# Patient Record
Sex: Male | Born: 1940 | Race: White | Hispanic: No | Marital: Single | Smoking: Current every day smoker
Health system: Southern US, Community
[De-identification: ages and names within clinical notes are randomized; demographics above are authoritative.]

## PROBLEM LIST (undated history)

## (undated) DIAGNOSIS — F329 Major depressive disorder, single episode, unspecified: Secondary | ICD-10-CM

## (undated) DIAGNOSIS — J439 Emphysema, unspecified: Secondary | ICD-10-CM

## (undated) DIAGNOSIS — J189 Pneumonia, unspecified organism: Secondary | ICD-10-CM

## (undated) DIAGNOSIS — F10239 Alcohol dependence with withdrawal, unspecified: Secondary | ICD-10-CM

## (undated) DIAGNOSIS — I10 Essential (primary) hypertension: Secondary | ICD-10-CM

## (undated) DIAGNOSIS — Z9889 Other specified postprocedural states: Secondary | ICD-10-CM

## (undated) DIAGNOSIS — G8929 Other chronic pain: Secondary | ICD-10-CM

## (undated) DIAGNOSIS — F10939 Alcohol use, unspecified with withdrawal, unspecified: Secondary | ICD-10-CM

## (undated) DIAGNOSIS — F32A Depression, unspecified: Secondary | ICD-10-CM

## (undated) DIAGNOSIS — C4490 Unspecified malignant neoplasm of skin, unspecified: Secondary | ICD-10-CM

## (undated) DIAGNOSIS — K2270 Barrett's esophagus without dysplasia: Secondary | ICD-10-CM

## (undated) DIAGNOSIS — R351 Nocturia: Secondary | ICD-10-CM

## (undated) DIAGNOSIS — R55 Syncope and collapse: Secondary | ICD-10-CM

## (undated) DIAGNOSIS — R3915 Urgency of urination: Secondary | ICD-10-CM

## (undated) DIAGNOSIS — I5181 Takotsubo syndrome: Secondary | ICD-10-CM

## (undated) DIAGNOSIS — R112 Nausea with vomiting, unspecified: Secondary | ICD-10-CM

## (undated) DIAGNOSIS — Z860100 Personal history of colon polyps, unspecified: Secondary | ICD-10-CM

## (undated) DIAGNOSIS — E785 Hyperlipidemia, unspecified: Secondary | ICD-10-CM

## (undated) DIAGNOSIS — M1990 Unspecified osteoarthritis, unspecified site: Secondary | ICD-10-CM

## (undated) DIAGNOSIS — H3530 Unspecified macular degeneration: Secondary | ICD-10-CM

## (undated) DIAGNOSIS — T7840XA Allergy, unspecified, initial encounter: Secondary | ICD-10-CM

## (undated) DIAGNOSIS — N400 Enlarged prostate without lower urinary tract symptoms: Secondary | ICD-10-CM

## (undated) DIAGNOSIS — I639 Cerebral infarction, unspecified: Secondary | ICD-10-CM

## (undated) DIAGNOSIS — I2510 Atherosclerotic heart disease of native coronary artery without angina pectoris: Secondary | ICD-10-CM

## (undated) DIAGNOSIS — Z87442 Personal history of urinary calculi: Secondary | ICD-10-CM

## (undated) DIAGNOSIS — W19XXXA Unspecified fall, initial encounter: Secondary | ICD-10-CM

## (undated) DIAGNOSIS — M549 Dorsalgia, unspecified: Secondary | ICD-10-CM

## (undated) DIAGNOSIS — K219 Gastro-esophageal reflux disease without esophagitis: Secondary | ICD-10-CM

## (undated) DIAGNOSIS — G629 Polyneuropathy, unspecified: Secondary | ICD-10-CM

## (undated) DIAGNOSIS — J449 Chronic obstructive pulmonary disease, unspecified: Secondary | ICD-10-CM

## (undated) DIAGNOSIS — K2970 Gastritis, unspecified, without bleeding: Secondary | ICD-10-CM

## (undated) DIAGNOSIS — K259 Gastric ulcer, unspecified as acute or chronic, without hemorrhage or perforation: Secondary | ICD-10-CM

## (undated) DIAGNOSIS — E871 Hypo-osmolality and hyponatremia: Secondary | ICD-10-CM

## (undated) HISTORY — DX: Allergy, unspecified, initial encounter: T78.40XA

## (undated) HISTORY — PX: OTHER SURGICAL HISTORY: SHX169

## (undated) HISTORY — PX: DENTAL SURGERY: SHX609

## (undated) HISTORY — PX: TONSILLECTOMY: SUR1361

## (undated) HISTORY — PX: COLONOSCOPY: SHX174

## (undated) HISTORY — DX: Atherosclerotic heart disease of native coronary artery without angina pectoris: I25.10

## (undated) HISTORY — PX: UPPER GASTROINTESTINAL ENDOSCOPY: SHX188

## (undated) HISTORY — PX: ROTATOR CUFF REPAIR: SHX139

## (undated) HISTORY — PX: CARDIAC CATHETERIZATION: SHX172

## (undated) HISTORY — DX: Gastro-esophageal reflux disease without esophagitis: K21.9

## (undated) HISTORY — PX: APPENDECTOMY: SHX54

## (undated) HISTORY — DX: Hyperlipidemia, unspecified: E78.5

## (undated) HISTORY — DX: Unspecified osteoarthritis, unspecified site: M19.90

## (undated) HISTORY — PX: POLYPECTOMY: SHX149

---

## 1993-09-02 DIAGNOSIS — I639 Cerebral infarction, unspecified: Secondary | ICD-10-CM

## 1993-09-02 HISTORY — DX: Cerebral infarction, unspecified: I63.9

## 1998-05-26 ENCOUNTER — Ambulatory Visit (HOSPITAL_COMMUNITY): Admission: RE | Admit: 1998-05-26 | Discharge: 1998-05-26 | Payer: Self-pay | Admitting: *Deleted

## 2000-04-17 ENCOUNTER — Ambulatory Visit (HOSPITAL_COMMUNITY): Admission: RE | Admit: 2000-04-17 | Discharge: 2000-04-17 | Payer: Self-pay | Admitting: *Deleted

## 2000-04-22 ENCOUNTER — Ambulatory Visit (HOSPITAL_COMMUNITY): Admission: RE | Admit: 2000-04-22 | Discharge: 2000-04-22 | Payer: Self-pay | Admitting: *Deleted

## 2001-02-19 ENCOUNTER — Ambulatory Visit (HOSPITAL_COMMUNITY): Admission: RE | Admit: 2001-02-19 | Discharge: 2001-02-19 | Payer: Self-pay | Admitting: Internal Medicine

## 2001-05-06 ENCOUNTER — Encounter (INDEPENDENT_AMBULATORY_CARE_PROVIDER_SITE_OTHER): Payer: Self-pay | Admitting: *Deleted

## 2001-05-06 ENCOUNTER — Encounter: Payer: Self-pay | Admitting: Gastroenterology

## 2001-05-06 ENCOUNTER — Ambulatory Visit (HOSPITAL_COMMUNITY): Admission: RE | Admit: 2001-05-06 | Discharge: 2001-05-06 | Payer: Self-pay | Admitting: *Deleted

## 2001-05-06 ENCOUNTER — Encounter (INDEPENDENT_AMBULATORY_CARE_PROVIDER_SITE_OTHER): Payer: Self-pay | Admitting: Specialist

## 2001-12-10 ENCOUNTER — Encounter: Payer: Self-pay | Admitting: Orthopedic Surgery

## 2001-12-15 ENCOUNTER — Observation Stay (HOSPITAL_COMMUNITY): Admission: RE | Admit: 2001-12-15 | Discharge: 2001-12-16 | Payer: Self-pay | Admitting: Orthopedic Surgery

## 2002-02-16 ENCOUNTER — Observation Stay (HOSPITAL_COMMUNITY): Admission: RE | Admit: 2002-02-16 | Discharge: 2002-02-17 | Payer: Self-pay | Admitting: Orthopedic Surgery

## 2002-02-19 ENCOUNTER — Encounter: Payer: Self-pay | Admitting: Urology

## 2002-02-19 ENCOUNTER — Ambulatory Visit (HOSPITAL_COMMUNITY): Admission: RE | Admit: 2002-02-19 | Discharge: 2002-02-19 | Payer: Self-pay | Admitting: Urology

## 2003-01-24 ENCOUNTER — Emergency Department (HOSPITAL_COMMUNITY): Admission: EM | Admit: 2003-01-24 | Discharge: 2003-01-24 | Payer: Self-pay | Admitting: Emergency Medicine

## 2005-04-16 ENCOUNTER — Encounter: Payer: Self-pay | Admitting: Gastroenterology

## 2005-04-16 ENCOUNTER — Encounter (INDEPENDENT_AMBULATORY_CARE_PROVIDER_SITE_OTHER): Payer: Self-pay | Admitting: *Deleted

## 2005-04-16 ENCOUNTER — Ambulatory Visit (HOSPITAL_COMMUNITY): Admission: RE | Admit: 2005-04-16 | Discharge: 2005-04-16 | Payer: Self-pay | Admitting: *Deleted

## 2007-07-10 ENCOUNTER — Encounter: Admission: RE | Admit: 2007-07-10 | Discharge: 2007-07-10 | Payer: Self-pay | Admitting: Neurosurgery

## 2008-04-28 ENCOUNTER — Ambulatory Visit: Payer: Self-pay | Admitting: Gastroenterology

## 2008-04-28 DIAGNOSIS — K219 Gastro-esophageal reflux disease without esophagitis: Secondary | ICD-10-CM | POA: Insufficient documentation

## 2008-04-28 DIAGNOSIS — R1314 Dysphagia, pharyngoesophageal phase: Secondary | ICD-10-CM | POA: Insufficient documentation

## 2008-04-28 DIAGNOSIS — M129 Arthropathy, unspecified: Secondary | ICD-10-CM | POA: Insufficient documentation

## 2008-04-28 DIAGNOSIS — E785 Hyperlipidemia, unspecified: Secondary | ICD-10-CM

## 2008-04-28 DIAGNOSIS — I25119 Atherosclerotic heart disease of native coronary artery with unspecified angina pectoris: Secondary | ICD-10-CM

## 2008-04-28 DIAGNOSIS — I6350 Cerebral infarction due to unspecified occlusion or stenosis of unspecified cerebral artery: Secondary | ICD-10-CM | POA: Insufficient documentation

## 2008-04-28 DIAGNOSIS — Z87442 Personal history of urinary calculi: Secondary | ICD-10-CM

## 2008-05-03 ENCOUNTER — Telehealth: Payer: Self-pay | Admitting: Gastroenterology

## 2008-05-20 ENCOUNTER — Ambulatory Visit: Payer: Self-pay | Admitting: Gastroenterology

## 2008-05-20 ENCOUNTER — Encounter: Payer: Self-pay | Admitting: Gastroenterology

## 2008-05-24 ENCOUNTER — Encounter: Payer: Self-pay | Admitting: Gastroenterology

## 2008-05-25 ENCOUNTER — Telehealth: Payer: Self-pay | Admitting: Gastroenterology

## 2009-05-18 ENCOUNTER — Telehealth: Payer: Self-pay | Admitting: Gastroenterology

## 2009-05-18 DIAGNOSIS — K2270 Barrett's esophagus without dysplasia: Secondary | ICD-10-CM

## 2009-06-13 ENCOUNTER — Ambulatory Visit: Payer: Self-pay | Admitting: Gastroenterology

## 2009-06-20 ENCOUNTER — Encounter: Payer: Self-pay | Admitting: Gastroenterology

## 2009-06-20 ENCOUNTER — Ambulatory Visit: Payer: Self-pay | Admitting: Gastroenterology

## 2009-06-22 ENCOUNTER — Encounter: Payer: Self-pay | Admitting: Gastroenterology

## 2009-11-03 ENCOUNTER — Encounter: Admission: RE | Admit: 2009-11-03 | Discharge: 2009-11-03 | Payer: Self-pay | Admitting: Cardiology

## 2009-11-06 ENCOUNTER — Ambulatory Visit (HOSPITAL_COMMUNITY): Admission: RE | Admit: 2009-11-06 | Discharge: 2009-11-07 | Payer: Self-pay | Admitting: Cardiology

## 2009-12-07 ENCOUNTER — Encounter (HOSPITAL_COMMUNITY): Admission: RE | Admit: 2009-12-07 | Discharge: 2010-03-01 | Payer: Self-pay | Admitting: Cardiology

## 2010-02-16 ENCOUNTER — Encounter: Payer: Self-pay | Admitting: Pulmonary Disease

## 2010-02-16 ENCOUNTER — Ambulatory Visit (HOSPITAL_COMMUNITY): Admission: RE | Admit: 2010-02-16 | Discharge: 2010-02-16 | Payer: Self-pay | Admitting: Internal Medicine

## 2010-02-26 ENCOUNTER — Ambulatory Visit: Payer: Self-pay | Admitting: Pulmonary Disease

## 2010-02-26 DIAGNOSIS — J439 Emphysema, unspecified: Secondary | ICD-10-CM

## 2010-02-26 DIAGNOSIS — R0789 Other chest pain: Secondary | ICD-10-CM

## 2010-02-27 ENCOUNTER — Ambulatory Visit: Payer: Self-pay | Admitting: Internal Medicine

## 2010-03-09 ENCOUNTER — Telehealth: Payer: Self-pay | Admitting: Pulmonary Disease

## 2010-04-04 ENCOUNTER — Encounter: Payer: Self-pay | Admitting: Gastroenterology

## 2010-04-09 ENCOUNTER — Ambulatory Visit: Payer: Self-pay | Admitting: Pulmonary Disease

## 2010-06-02 ENCOUNTER — Encounter (HOSPITAL_COMMUNITY)
Admission: RE | Admit: 2010-06-02 | Discharge: 2010-08-31 | Payer: Self-pay | Source: Home / Self Care | Attending: Pulmonary Disease | Admitting: Pulmonary Disease

## 2010-06-04 ENCOUNTER — Ambulatory Visit: Payer: Self-pay | Admitting: Gastroenterology

## 2010-06-04 DIAGNOSIS — Z860100 Personal history of colon polyps, unspecified: Secondary | ICD-10-CM | POA: Insufficient documentation

## 2010-06-05 ENCOUNTER — Telehealth: Payer: Self-pay | Admitting: Gastroenterology

## 2010-06-05 ENCOUNTER — Encounter: Payer: Self-pay | Admitting: Pulmonary Disease

## 2010-07-09 ENCOUNTER — Ambulatory Visit: Payer: Self-pay | Admitting: Pulmonary Disease

## 2010-07-10 ENCOUNTER — Ambulatory Visit: Payer: Self-pay | Admitting: Gastroenterology

## 2010-07-22 ENCOUNTER — Encounter: Payer: Self-pay | Admitting: Gastroenterology

## 2010-09-04 ENCOUNTER — Encounter (HOSPITAL_COMMUNITY)
Admission: RE | Admit: 2010-09-04 | Discharge: 2010-10-02 | Payer: Self-pay | Source: Home / Self Care | Attending: Pulmonary Disease | Admitting: Pulmonary Disease

## 2010-10-04 ENCOUNTER — Encounter (HOSPITAL_COMMUNITY): Payer: BC Managed Care – PPO | Attending: Pulmonary Disease

## 2010-10-04 DIAGNOSIS — K219 Gastro-esophageal reflux disease without esophagitis: Secondary | ICD-10-CM | POA: Insufficient documentation

## 2010-10-04 DIAGNOSIS — F17200 Nicotine dependence, unspecified, uncomplicated: Secondary | ICD-10-CM | POA: Insufficient documentation

## 2010-10-04 DIAGNOSIS — E785 Hyperlipidemia, unspecified: Secondary | ICD-10-CM | POA: Insufficient documentation

## 2010-10-04 DIAGNOSIS — I2510 Atherosclerotic heart disease of native coronary artery without angina pectoris: Secondary | ICD-10-CM | POA: Insufficient documentation

## 2010-10-04 DIAGNOSIS — I10 Essential (primary) hypertension: Secondary | ICD-10-CM | POA: Insufficient documentation

## 2010-10-04 DIAGNOSIS — Z5189 Encounter for other specified aftercare: Secondary | ICD-10-CM | POA: Insufficient documentation

## 2010-10-04 DIAGNOSIS — Z8673 Personal history of transient ischemic attack (TIA), and cerebral infarction without residual deficits: Secondary | ICD-10-CM | POA: Insufficient documentation

## 2010-10-04 DIAGNOSIS — J4489 Other specified chronic obstructive pulmonary disease: Secondary | ICD-10-CM | POA: Insufficient documentation

## 2010-10-04 DIAGNOSIS — Z9861 Coronary angioplasty status: Secondary | ICD-10-CM | POA: Insufficient documentation

## 2010-10-04 DIAGNOSIS — Z7982 Long term (current) use of aspirin: Secondary | ICD-10-CM | POA: Insufficient documentation

## 2010-10-04 DIAGNOSIS — J449 Chronic obstructive pulmonary disease, unspecified: Secondary | ICD-10-CM | POA: Insufficient documentation

## 2010-10-04 DIAGNOSIS — Z7902 Long term (current) use of antithrombotics/antiplatelets: Secondary | ICD-10-CM | POA: Insufficient documentation

## 2010-10-04 NOTE — Letter (Signed)
Summary: [facility] Instructions  [facility]  [address] [place], [place] [address]   Phone: [phone]  Fax: [phone]       Brad Turner    1940-09-28    MRN: [id number]        Procedure Nazar /Date:MONDAY 06/18/2010     Arrival Time:2PM     Procedure Time:3PM     Location of Procedure:                    X  [facility] (4th Floor)   PREPARATION FOR COLONOSCOPY WITH MOVIPREP   Starting 5 days prior to your procedure 10/12/2011do not eat nuts, seeds, popcorn, corn, beans, peas,  salads, or any raw vegetables.  Do not take any fiber supplements (e.g. Metamucil, Citrucel, and Benefiber).  THE Constantin BEFORE YOUR PROCEDURE         DATE: 06/17/2010  FIE:PPIRJJ  1.  Drink clear liquids the entire Narain-NO SOLID FOOD  2.  Do not drink anything colored red or purple.  Avoid juices with pulp.  No orange juice.  3.  Drink at least 64 oz. (8 glasses) of fluid/clear liquids during the [place] to prevent dehydration and help the prep work efficiently.  CLEAR LIQUIDS INCLUDE: Water Jello Ice Popsicles Tea (sugar ok, no milk/cream) Powdered fruit flavored drinks Coffee (sugar ok, no milk/cream) Gatorade Juice: apple, white grape, white cranberry  Lemonade Clear bullion, consomm, broth Carbonated beverages (any kind) Strained chicken noodle soup Hard Candy                             4.  In the morning, mix first dose of MoviPrep solution:    Empty 1 Pouch A and 1 Pouch B into the disposable container    Add lukewarm drinking water to the top line of the container. Mix to dissolve    Refrigerate (mixed solution should be used within 24 hrs)  5.  Begin drinking the prep at 5:00 p.m. The MoviPrep container is divided by 4 marks.   Every 15 minutes drink the solution down to the next mark (approximately 8 oz) until the full liter is complete.   6.  Follow completed prep with 16 oz of clear liquid of your choice (Nothing red or purple).  Continue to  drink clear liquids until bedtime.  7.  Before going to bed, mix second dose of MoviPrep solution:    Empty 1 Pouch A and 1 Pouch B into the disposable container    Add lukewarm drinking water to the top line of the container. Mix to dissolve    Refrigerate  THE [place] OF YOUR PROCEDURE      DATE: 06/18/2010 Dery: MONDAY  Beginning at 10a.m. (5 hours before procedure):         1. Every 15 minutes, drink the solution down to the next mark (approx 8 oz) until the full liter is complete.  2. Follow completed prep with 16 oz. of clear liquid of your choice.    3. You may drink clear liquids until 1PM (2 HOURS BEFORE PROCEDURE).   MEDICATION INSTRUCTIONS  Unless otherwise instructed, you should take regular prescription medications with a small sip of water   as early as possible the morning of your procedure.    Stop taking Plavix or Aggrenox on  _  _  (7 days before procedure).   You will be contacted by our office prior to  your procedure for directions on holding your Plavix.  If you do not hear from our office 1 week prior to your scheduled procedure, please call [phone] to discuss.           OTHER INSTRUCTIONS  You will need a responsible adult at least 68 years of age to accompany you and drive you home.   This person must remain in the waiting room during your procedure.  Wear loose fitting clothing that is easily removed.  Leave jewelry and other valuables at home.  However, you may wish to bring a book to read or  an iPod/MP3 player to listen to music as you wait for your procedure to start.  Remove all body piercing jewelry and leave at home.  Total time from sign-in until discharge is approximately 2-3 hours.  You should go home directly after your procedure and rest.  You can resume normal activities the  [place] after your procedure.  The Bureau of your procedure you should not:   Drive   Make legal decisions   Operate machinery   Drink alcohol    Return to work  You will receive specific instructions about eating, activities and medications before you leave.    The above instructions have been reviewed and explained to me by   _______________________    I fully understand and can verbalize these instructions _____________________________ Date _________  Appended Document: Moviprep Instructions CHANGED PROCEDURE TO 07/10/2010 AT 4PM PT AWARE

## 2010-10-04 NOTE — Assessment & Plan Note (Signed)
Summary: rov for emphysema   Visit Type:  Follow-up Copy to:  Guerry Bruin Primary Provider/Referring Provider:  Guerry Bruin, M.D.  CC:  3 month follow up. pt states breathing is still the same. Pt c/o cough with clear mucus. Pt states he currently has a "cold". Pt states the spiriva is drying his mouth out and he has not used it in 3 days.  pt states he smokes 5 cigs a [place]. Marland Kitchen  History of Present Illness: the pt comes in today for f/u of his known emphysema.  He feels that he is maintaining a stable baseline wrt exertional tolerance, and his only complaint about his meds is a dry mouth from spiriva.  He unfortunately is still smoking, though he tells me a small amount.  He has chronic cough with white mucus.  Preventive Screening-Counseling & Management  Alcohol-Tobacco     Smoking Status: current     Smoking Cessation Counseling: yes     Packs/[place]: <0.25     Tobacco Counseling: to quit use of tobacco products  Current Medications (verified): 1)  Plavix 75 Mg Tabs (Clopidogrel Bisulfate) .Marland Kitchen.. 1 Tablet By Mouth Once Daily 2)  Lipitor 80 Mg Tabs (Atorvastatin Calcium) .Marland Kitchen.. 1 Tablet By Mouth Once Daily 3)  Tricor 145 Mg Tabs (Fenofibrate) .Marland Kitchen.. 1 Tablet By Mouth Once Daily 4)  Amlodipine Besylate 10 Mg Tabs (Amlodipine Besylate) .Marland Kitchen.. 1 Tablet By Mouth Once Daily 5)  Ramipril 10 Mg Caps (Ramipril) .Marland Kitchen.. 1 Tablet By Mouth Once Daily 6)  Triamterene-Hctz 37.5-25 Mg Caps (Triamterene-Hctz) .... 1/2 Tablet By Mouth Once Daily 7)  Pantoprazole Sodium 40 Mg Tbec (Pantoprazole Sodium) .... Take 1 Tablet By Mouth Once A [place] 8)  Aspirin 81 Mg Tbec (Aspirin) .Marland Kitchen.. 1 Tablet By Mouth Once Daily 9)  Folic Acid 400 Mcg Tabs (Folic Acid) .Marland Kitchen.. 1 Tablet By Mouth Once Daily 10)  Symbicort 160-4.5 Mcg/act  Aero (Budesonide-Formoterol Fumarate) .... Two Puffs Twice Daily 11)  Spiriva Handihaler 18 Mcg  Caps (Tiotropium Bromide Monohydrate) .... One Puff in Handihaler Daily 12)  Proair Hfa 108 (90 Base)  Mcg/act  Aers (Albuterol Sulfate) .... 2 Puffs Every 4-6 Hours As Needed  Allergies (verified): 1)  ! Macrodantin 2)  ! * Ivp Dye  Social History: Packs/Stepp:  <0.25  Review of Systems       The patient complains of shortness of breath with activity, productive cough, sore throat, nasal congestion/difficulty breathing through nose, and joint stiffness or pain.  The patient denies shortness of breath at rest, non-productive cough, coughing up blood, chest pain, irregular heartbeats, acid heartburn, indigestion, loss of appetite, weight change, abdominal pain, difficulty swallowing, tooth/dental problems, headaches, sneezing, itching, ear ache, anxiety, depression, hand/feet swelling, rash, change in color of mucus, and fever.    Vital Signs:  Patient profile:   70 year old male Height:      66 inches Weight:      173.38 pounds BMI:     28.09 O2 Sat:      97 % on Room air Temp:     98.2 degrees F oral Pulse rate:   79 / minute BP sitting:   122 / 78  (left arm) Cuff size:   regular  Vitals Entered By: Carver Fila (July 09, 2010 9:20 AM)  O2 Flow:  Room air CC: 3 month follow up. pt states breathing is still the same. Pt c/o cough with clear mucus. Pt states he currently has a "cold". Pt states the spiriva is  drying his mouth out and he has not used it in 3 days.  pt states he smokes 5 cigs a [place].  Comments meds and allergies updated Phone number updated Carver Fila  July 09, 2010 9:21 AM    Physical Exam  General:  wd male in nad Lungs:  clear to auscultation Heart:  rrr, no mrg Extremities:  no edema or cyanosis  Neurologic:  alert and oriented, moves all 4.   Impression & Recommendations:  Problem # 1:  EMPHYSEMA (ICD-492.8) the pt is maintaining a stable baseline, but unfortunately is still smoking.  We have talked about trying chantix again.  He has not had an acute exacerbation or recent pulmonary infection.  I have told him it is ok to come off spiriva as a  trial if he feels his dry mouth is really that bothersome, but to restart if he sees a decrement in his breathing.  Medications Added to Medication List This Visit: 1)  Chantix Starting Month Pak 0.5 Mg X 11 & 1 Mg X 42 Misc (Varenicline tartrate) .... Take as directed---refills are to be for the continuing pak  Other Orders: Est. Patient Level III (16109) Tobacco use cessation intermediate 3-10 minutes (60454)  Patient Instructions: 1)  continue symbicort, but can stop spiriva as a trial if you feel the mouth dryness is really bothering you. 2)  stop smoking.  will give you a prescription for the chantix starter pack to try again. 3)  followup with me in 4-63mos.  Prescriptions: CHANTIX STARTING MONTH PAK 0.5 MG X 11 & 1 MG X 42  MISC (VARENICLINE TARTRATE) Take as directed---Refills are to be for the continuing pak  #1 x 0   Entered and Authorized by:   Barbaraann Share MD   Signed by:   Barbaraann Share MD on 07/09/2010   Method used:   Print then Give to Patient   RxID:   [id number]    Immunization History:  Influenza Immunization History:    Influenza:  historical (06/02/2010)  Pneumovax Immunization History:    Pneumovax:  historical (06/02/2009)

## 2010-10-04 NOTE — Miscellaneous (Signed)
Summary: Order/[facility]  Order/[facility]   Imported By: Sherian Rein 06/13/2010 10:12:31  _____________________________________________________________________  External Attachment:    Type:   Image     Comment:   External Document

## 2010-10-04 NOTE — Progress Notes (Signed)
Summary: OK to hold plavix  Phone Note Outgoing Call Call back at [facility] Phone [phone]   Call placed by: Merri Ray CMA Duncan Dull),  June 05, 2010 8:58 AM Summary of Call: Called pt to inform pt ok to hold plavix 7 days prior to procedure Initial call taken by: Merri Ray CMA Duncan Dull),  June 05, 2010 8:58 AM

## 2010-10-04 NOTE — Letter (Signed)
Summary: Anticoagulation Modification Letter  [facility]  [address] [place], [place] [address]   Phone: [phone]  Fax: [phone]    June 04, 2010  Re:    Brad Turner DOB:    Oct 07, 1940 MRN:    [id number]    Dear [name],MD  We have scheduled the above patient for an endoscopic procedure. Our records show that  he/she is on anticoagulation therapy. Please advise as to how long the patient may come off their therapy of Plavix  prior to the scheduled procedure(s) on 10/17/2011_.   Please fax back/or route the completed form to Robin  at [phone].  Thank you for your help with this matter.  Sincerely,  Merri Ray CMA Duncan Dull)   Physician Recommendation:  Hold Plavix 7 days prior ________________  Hold Coumadin 5 days prior ____________  Other ______________________________     Appended Document: Anticoagulation Modification Letter Recieved approval from Guerry Bruin for pt to hold plavix 7 days prior will send approval down to be scanned in

## 2010-10-04 NOTE — Assessment & Plan Note (Signed)
Summary: consult for chest pain and emphysema   Copy to:  Guerry Bruin Primary Provider/Referring Provider:  Guerry Bruin, M.D.  CC:  Pulmonary Consult.  History of Present Illness: The pt is a 70y/o male who I have been asked to see for dyspnea and atypical chest pain.  He has had chest discomfort for the last 3-4 months, and has noted worsening sob walking 2 blocks from the parking lot to his office.  The pt describes the pain as dull, but also pressure-like.  He denies pleuritic chest pain.  The pain does improve with nsaid use.  He has had a cath in Mar of this year which did show CAD, and is s/p PCI without much change in his symptoms.  He did go thru cardiac rehab program, and feels that he did well.  Currently, the pt notes significant sob with any kind of elevated surface.  He has also noted interference with his ability to play his trumpet.  He has had some cough, with only white to clear mucus.  He did undergo pfts this month which reveal moderate obstruction, +airtrapping, no restriction, and a mild reduction in DLCO.    Preventive Screening-Counseling & Management  Alcohol-Tobacco     Smoking Status: current     Smoking Cessation Counseling: yes     Packs/[place]: 1.0     Year Started: age 61  Current Medications (verified): 1)  Plavix 75 Mg Tabs (Clopidogrel Bisulfate) .Marland Kitchen.. 1 Tablet By Mouth Once Daily 2)  Lipitor 80 Mg Tabs (Atorvastatin Calcium) .Marland Kitchen.. 1 Tablet By Mouth Once Daily 3)  Tricor 145 Mg Tabs (Fenofibrate) .Marland Kitchen.. 1 Tablet By Mouth Once Daily 4)  Amlodipine Besylate 10 Mg Tabs (Amlodipine Besylate) .Marland Kitchen.. 1 Tablet By Mouth Once Daily 5)  Ramipril 10 Mg Caps (Ramipril) .Marland Kitchen.. 1 Tablet By Mouth Once Daily 6)  Triamterene-Hctz 37.5-25 Mg Caps (Triamterene-Hctz) .... 1/2 Tablet By Mouth Once Daily 7)  Pantoprazole Sodium 40 Mg Tbec (Pantoprazole Sodium) .... Take 1 Tablet By Mouth Once A [place] 8)  Aspirin 81 Mg Tbec (Aspirin) .Marland Kitchen.. 1 Tablet By Mouth Once Daily 9)  Folic Acid  400 Mcg Tabs (Folic Acid) .Marland Kitchen.. 1 Tablet By Mouth Once Daily  Allergies (verified): 1)  ! Macrodantin 2)  ! 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 Family History of Heart Disease: father No FH of Colon Cancer: Mother-CVA allergies: siblings heart disase: mother, father rheumatism: mother   Social History: Reviewed history from 04/28/2008 and no changes required. Patient currently smokes 1 ppd.  started at age 31/  pt is single , has children, and lives with son.   pt works as a Airline pilot.   Alcohol Use - yes Daily Caffeine Use Packs/Kogler:  1.0  Review of Systems       The patient complains of shortness of breath with activity, productive cough, chest pain, acid heartburn, and tooth/dental problems.  The patient denies shortness of breath at rest, non-productive cough, coughing up blood, irregular heartbeats, indigestion, loss of appetite, weight change, abdominal pain, difficulty swallowing, sore throat, headaches, nasal congestion/difficulty breathing through nose, sneezing, itching, ear ache, anxiety, depression, hand/feet swelling, joint stiffness or pain, rash, change in color of mucus, and fever.  Vital Signs:  Patient profile:   70 year old male Height:      66 inches Weight:      176 pounds BMI:     28.51 O2 Sat:      94 % on Room air Temp:     98.0 degrees F oral Pulse rate:   82 / minute BP sitting:   104 / 70  (left arm) Cuff size:   regular  Vitals Entered By: Arman Filter LPN (February 26, 2010 10:49 AM)  O2 Flow:  Room air CC: Pulmonary Consult Comments Medications reviewed with patient Arman Filter LPN  February 26, 2010 10:49 AM    Physical Exam  General:  wd male in nad Eyes:  PERRLA and EOMI.   Nose:  patent without discharge Mouth:  clear Neck:  no jvd, tmg, LN Lungs:  decreased bs throughout, no wheezing or rhonchi Heart:  rrr, no mrg Abdomen:  soft and nontender, bs+ Extremities:  mild edema and varicosities no cyanosis pulses intact distally Neurologic:  alert and oriented, moves all four.   Impression & Recommendations:  Problem # 1:  CHEST PAIN, ATYPICAL (ICD-786.59) I suspect the pt's atypical pain is MSK in origin, but I cannot exclude the possibility that airtrapping may be playing a role.  I am also concerned, with his extensive smoking history, about  the possibility of a mediastinal mass.  I think he needs a ct chest for evaluation.  Problem # 2:  EMPHYSEMA (ICD-492.8) the pt has moderate airflow obstruction that I suspect is due to emphysema, although he may have some degree of reversibility with smoking cessation.  I would like to put him on an aggressive bronchodilator regimen to see if it will improve his breathing.  I have stressed the importance of smoking cessation, and that if he does not quit he will have progressive disease.  I have discussed various smoking cessation techniques, and he would like to start with nicotine patches.  Medications Added to Medication List This Visit: 1)  Pantoprazole Sodium 40 Mg Tbec (Pantoprazole sodium) .... Take 1 tablet by mouth once a [place] 2)  Symbicort 160-4.5 Mcg/act Aero (Budesonide-formoterol fumarate) .... Two puffs twice daily 3)  Spiriva Handihaler 18 Mcg Caps (Tiotropium bromide monohydrate) .... One puff in handihaler daily 4)  Proair Hfa 108 (90 Base) Mcg/act Aers (Albuterol sulfate) .... 2 puffs every 4-6 hours as needed  Other Orders: Consultation Level V (04540) Tobacco use cessation intensive >10 minutes (98119) Radiology Referral (Radiology)  Patient Instructions: 1)  start symbicort 160/4.5 2  inhalations am and pm everyday...rinse mouth well. 2)  spiriva one inhalation each am 3)  proair 2 puffs up to every 6hrs only if needed for rescue 4)  stop smoking.  start nicotine patch and cut back to 1/2 pack a [place] to start with.  Then wean from there. 5)  will check ct chest in light of persistent chest pain.  will call you with results. 6)  followup with me in 6weeks.   Prescriptions: PROAIR HFA 108 (90 BASE) MCG/ACT  AERS (ALBUTEROL SULFATE) 2 puffs every 4-6 hours as needed  #1 x 6   Entered and Authorized by:   Barbaraann Share MD   Signed by:   Barbaraann Share MD on 02/26/2010   Method used:   Print then Give to Patient   RxID:   [id number] SPIRIVA HANDIHALER 18 MCG  CAPS (TIOTROPIUM BROMIDE MONOHYDRATE) one puff in handihaler daily  #30 x 6  Entered and Authorized by:   Barbaraann Share MD   Signed by:   Barbaraann Share MD on 02/26/2010   Method used:   Print then Give to Patient   RxID:   [id number] SYMBICORT 160-4.5 MCG/ACT  AERO (BUDESONIDE-FORMOTEROL FUMARATE) Two puffs twice daily  #1 x 6   Entered and Authorized by:   Barbaraann Share MD   Signed by:   Barbaraann Share MD on 02/26/2010   Method used:   Print then Give to Patient   RxID:   [id number]

## 2010-10-04 NOTE — Letter (Signed)
Summary: Colonoscopy Letter  [facility]  [address] [place], [place] [address]   Phone: [phone]  Fax: [phone]      April 04, 2010 MRN: [id number]   [facility] [address] [place], [place]  [address]   Dear Mr. TIPLER,   According to your medical record, it is time for you to schedule a Colonoscopy. The American Cancer Society recommends this procedure as a method to detect early colon cancer. Patients with a family history of colon cancer, or a personal history of colon polyps or inflammatory bowel disease are at increased risk.  This letter has been generated based on the recommendations made at the time of your procedure. If you feel that in your particular situation this may no longer apply, please contact our office.  Please call our office at [phone] to schedule this appointment or to update your records at your earliest convenience.  Thank you for cooperating with [place] to provide you with the very best care possible.   Sincerely,   Barbette Hair. Arlyce Dice, M.D.  [facility]ology Division [phone]

## 2010-10-04 NOTE — Assessment & Plan Note (Signed)
Summary: discuss COL on Plavix--ch.   History of Present Illness Primary GI MD: Melvia Heaps MD [facility] Primary Provider: Guerry Bruin, M.D. Requesting Provider: n/a Chief Complaint: Recall colonoscopy, pt is on Plavix and denies any GI sx. History of Present Illness:   Brad Turner is a 70 year old white male with a history of colon polyps here for recall colonoscopy.  In 2002 an adenomatous polyp was removed in the rectum.  Hyperplastic polyps were seen at colonoscopy in 2006.  He has no lower GI complaints.  The patient is on Plavix because of a history of CVA and coronary artery disease.  He underwent stent placement in 2011.  He was recently diagnosed with COPD.   GI Review of Systems      Denies abdominal pain, acid reflux, belching, bloating, chest pain, dysphagia with liquids, dysphagia with solids, heartburn, loss of appetite, nausea, vomiting, vomiting blood, weight loss, and  weight gain.        Denies anal fissure, black tarry stools, change in bowel habit, constipation, diarrhea, diverticulosis, fecal incontinence, heme positive stool, hemorrhoids, irritable bowel syndrome, jaundice, light color stool, liver problems, rectal bleeding, and  rectal pain.    Current Medications (verified): 1)  Plavix 75 Mg Tabs (Clopidogrel Bisulfate) .Marland Kitchen.. 1 Tablet By Mouth Once Daily 2)  Lipitor 80 Mg Tabs (Atorvastatin Calcium) .Marland Kitchen.. 1 Tablet By Mouth Once Daily 3)  Tricor 145 Mg Tabs (Fenofibrate) .Marland Kitchen.. 1 Tablet By Mouth Once Daily 4)  Amlodipine Besylate 10 Mg Tabs (Amlodipine Besylate) .Marland Kitchen.. 1 Tablet By Mouth Once Daily 5)  Ramipril 10 Mg Caps (Ramipril) .Marland Kitchen.. 1 Tablet By Mouth Once Daily 6)  Triamterene-Hctz 37.5-25 Mg Caps (Triamterene-Hctz) .... 1/2 Tablet By Mouth Once Daily 7)  Pantoprazole Sodium 40 Mg Tbec (Pantoprazole Sodium) .... Take 1 Tablet By Mouth Once A Ahmed 8)  Aspirin 81 Mg Tbec (Aspirin) .Marland Kitchen.. 1 Tablet By Mouth Once Daily 9)  Folic Acid 400 Mcg Tabs (Folic Acid) .Marland Kitchen.. 1 Tablet By  Mouth Once Daily 10)  Symbicort 160-4.5 Mcg/act  Aero (Budesonide-Formoterol Fumarate) .... Two Puffs Twice Daily 11)  Spiriva Handihaler 18 Mcg  Caps (Tiotropium Bromide Monohydrate) .... One Puff in Handihaler Daily 12)  Proair Hfa 108 (90 Base) Mcg/act  Aers (Albuterol Sulfate) .... 2 Puffs Every 4-6 Hours As Needed  Allergies (verified): 1)  ! Macrodantin 2)  ! * Ivp Dye  Past History:  Past Medical History: Reviewed history from 02/26/2010 and no changes required.  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 appendectomy angioplasty x 3 in 1989 and x1 in 2011 tonsillectomy B rotator cuff surgery  Family History: Reviewed history from 02/26/2010 and no changes required. Family History of Heart Disease: father No FH of Colon Cancer: Mother-CVA allergies: siblings heart disase: mother, father rheumatism: mother   Social History: Reviewed history from 04/09/2010 and no changes required. Patient currently smokes 1 ppd.  started at age 45.  pt has decreased smoking down to 5 to 6 cigs daily.  pt is single , has children, and lives with son.   pt works as a Airline pilot.   Alcohol Use - yes Daily Caffeine Use  Review of Systems  The patient denies allergy/sinus, anemia, anxiety-new, arthritis/joint pain, back pain, blood in urine, breast changes/lumps, change in vision, confusion, cough, coughing up blood, depression-new, fainting, fatigue, fever, headaches-new, hearing problems, heart murmur, heart rhythm changes, itching, menstrual pain,  muscle pains/cramps, night sweats, nosebleeds, pregnancy symptoms, shortness of breath, skin rash, sleeping problems, sore throat, swelling of feet/legs, swollen lymph glands, thirst - excessive , urination - excessive , urination changes/pain, urine  leakage, vision changes, and voice change.    Vital Signs:  Patient profile:   70 year old male Height:      66 inches Weight:      163 pounds BMI:     26.40 Pulse rate:   90 / minute Pulse rhythm:   regular BP sitting:   116 / 68  (left arm) Cuff size:   regular  Vitals Entered By: Christie Nottingham CMA Duncan Dull) (June 04, 2010 9:21 AM)  Physical Exam  Additional Exam:  On physical exam he is a well-developed large male  skin: anicteric HEENT: normocephalic; PEERLA; no nasal or pharyngeal abnormalities neck: supple nodes: no cervical lymphadenopathy chest: clear to ausculatation and percussion heart: no murmurs, gallops, or rubs abd: soft, nontender; BS normoactive; no abdominal masses, tenderness, organomegaly rectal: deferred ext: no cynanosis, clubbing, edema skeletal: no deformities neuro: oriented x 3; no focal abnormalities    Impression & Recommendations:  Problem # 1:  PERSONAL HISTORY OF COLONIC POLYPS (ICD-V12.72)  Plan followup colonoscopy.  Plavix will be held in anticipation of the exam provided this is approved by cardiology.  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 2)  Your colonoscopy is scheduled on 06/18/2010 at 3pm 3)  We will contact your prescriber of your Plavix about holding before your procedure 4)  The medication list was reviewed and reconciled.  All changed / newly prescribed medications were explained.  A complete medication list was provided to the patient / caregiver. Prescriptions: MOVIPREP 100 GM  SOLR (PEG-KCL-NACL-NASULF-NA ASC-C) As per prep instructions.  #1 x 0   Entered by:   Merri Ray CMA (AAMA)   Authorized by:   Louis Meckel MD   Signed by:   Merri Ray CMA  (AAMA) on 06/04/2010   Method used:   Electronically to        [facility] [phone]* (retail)       [address]       [place], [place]  [address]       Ph: [phone] or [phone]       Fax: [phone]   RxID:   [id number]

## 2010-10-04 NOTE — Letter (Signed)
Summary: Patient Notice-Hyperplastic Polyps  [facility]  [address] [place], [place] [address]   Phone: [phone]  Fax: [phone]        July 22, 2010 MRN: [id number]    [facility] [address] [place], [place]  [address]    Dear Mr. OFFERDAHL,  I am pleased to inform you that the colon polyp(s) removed during your recent colonoscopy was (were) found to be hyperplastic.  These types of polyps are NOT pre-cancerous.  It is therefore my recommendation that you have a repeat colonoscopy examination in 10_ years for routine colorectal cancer screening.  Should you develop new or worsening symptoms of abdominal pain, bowel habit changes or bleeding from the rectum or bowels, please schedule an evaluation with either your primary care physician or with me.  Additional information/recommendations:  __No further action with gastroenterology is needed at this time.      Please follow-up with your primary care physician for your other      healthcare needs. __Please call [phone] to schedule a return visit to review      your situation.  __Please keep your follow-up visit as already scheduled.  x__Continue treatment plan as outlined the [place] of your exam.  Please call us if you are having persistent problems or have questions about your condition that have not been fully answered at this time.  Sincerely,  Louis Meckel MD This letter has been electronically signed by your physician.  Appended Document: Patient Notice-Hyperplastic Polyps Letter mailed

## 2010-10-04 NOTE — Assessment & Plan Note (Signed)
Summary: rov for emphysema   Copy to:  Guerry Bruin Primary Provider/Referring Provider:  Guerry Bruin, M.D.  CC:  Pt is here for a 6 week f/u appt.  Pt states breathing has improved since starting Spiriva and Symbicort.  Pt states productive cough with clear sputum.  Pt states inhalers "tends to dry his mouth/throat out."  Pt currently smoking 5 to 6 cigs daily. Marland Kitchen  History of Present Illness: The pt comes in today for f/u of his known emphysema.  He was started on spiriva and symbicort last visit, and asked to work on smoking cessation.  He comes in today where he is better, with improved exertional tolerance.  He still has mild cough and rattle at times, but is still smoking 5-6 cigs a [place].    Medications Prior to Update: 1)  Plavix 75 Mg Tabs (Clopidogrel Bisulfate) .Marland Kitchen.. 1 Tablet By Mouth Once Daily 2)  Lipitor 80 Mg Tabs (Atorvastatin Calcium) .Marland Kitchen.. 1 Tablet By Mouth Once Daily 3)  Tricor 145 Mg Tabs (Fenofibrate) .Marland Kitchen.. 1 Tablet By Mouth Once Daily 4)  Amlodipine Besylate 10 Mg Tabs (Amlodipine Besylate) .Marland Kitchen.. 1 Tablet By Mouth Once Daily 5)  Ramipril 10 Mg Caps (Ramipril) .Marland Kitchen.. 1 Tablet By Mouth Once Daily 6)  Triamterene-Hctz 37.5-25 Mg Caps (Triamterene-Hctz) .... 1/2 Tablet By Mouth Once Daily 7)  Pantoprazole Sodium 40 Mg Tbec (Pantoprazole Sodium) .... Take 1 Tablet By Mouth Once A Rozier 8)  Aspirin 81 Mg Tbec (Aspirin) .Marland Kitchen.. 1 Tablet By Mouth Once Daily 9)  Folic Acid 400 Mcg Tabs (Folic Acid) .Marland Kitchen.. 1 Tablet By Mouth Once Daily 10)  Symbicort 160-4.5 Mcg/act  Aero (Budesonide-Formoterol Fumarate) .... Two Puffs Twice Daily 11)  Spiriva Handihaler 18 Mcg  Caps (Tiotropium Bromide Monohydrate) .... One Puff in Handihaler Daily 12)  Proair Hfa 108 (90 Base) Mcg/act  Aers (Albuterol Sulfate) .... 2 Puffs Every 4-6 Hours As Needed  Allergies (verified): 1)  ! Macrodantin 2)  ! * Ivp Dye  Social History: Patient currently smokes 1 ppd.  started at age 13.  pt has decreased smoking  down to 5 to 6 cigs daily.  pt is single , has children, and lives with son.   pt works as a Airline pilot.   Alcohol Use - yes Daily Caffeine Use  Review of Systems       The patient complains of shortness of breath with activity, productive cough, non-productive cough, chest pain, and nasal congestion/difficulty breathing through nose.  The patient denies shortness of breath at rest, coughing up blood, irregular heartbeats, acid heartburn, indigestion, loss of appetite, weight change, abdominal pain, difficulty swallowing, sore throat, tooth/dental problems, headaches, sneezing, itching, ear ache, anxiety, depression, hand/feet swelling, joint stiffness or pain, rash, change in color of mucus, and fever.    Vital Signs:  Patient profile:   70 year old male Height:      66 inches Weight:      173.38 pounds BMI:     28.09 O2 Sat:      98 % on Room air Temp:     97.6 degrees F oral Pulse rate:   86 / minute BP sitting:   110 / 68  (left arm) Cuff size:   regular  Vitals Entered By: Arman Filter LPN (April 09, 2010 9:07 AM)  O2 Flow:  Room air CC: Pt is here for a 6 week f/u appt.  Pt states breathing has improved since starting Spiriva and Symbicort.  Pt states productive cough with  clear sputum.  Pt states inhalers "tends to dry his mouth/throat out."  Pt currently smoking 5 to 6 cigs daily.  Comments Medications reviewed with patient Arman Filter LPN  April 09, 2010 9:07 AM    Physical Exam  General:  ow male in nad Lungs:  decreased bs, no wheezing or rhonchi Heart:  rrr Extremities:  no edema, + varicosities no cyanosis Neurologic:  alert and oriented, moves all 4.   Impression & Recommendations:  Problem # 1:  EMPHYSEMA (ICD-492.8) the pt is improved on his current bronchodilator regimen, but needs to totally quit smoking if he wishes to quit coughing.  I have also asked him to work on some type of conditioning program and weight loss.  Other Orders: Est. Patient  Level III ([address])  Patient Instructions: 1)  keep working on total smoking cessation 2)  stay on current inhalers for now 3)  work on weight loss and conditioning. 4)  followup with me in 3mos or sooner if having breathing issues.

## 2010-10-04 NOTE — Progress Notes (Signed)
Summary: pulm rehab/ inhaler  Phone Note Call from Patient   Caller: Patient Call For: Tifanny Dollens Summary of Call: pt was seen by his cardio dr today. says his dr rec that pt call [facility] and see about getting into pulm rehab at [facility]. pt also wants to know if he can come in next wk "just to see nurse"  and get instructions as to how to use his inhaler again? cell [phone] Initial call taken by: Tivis Ringer, CNA,  March 09, 2010 2:45 PM  Follow-up for Phone Call        [facility], pt would like to know what you think about pulmonary rehab, pls advise thanks! Follow-up by: Vernie Murders,  March 09, 2010 2:50 PM  Additional Follow-up for Phone Call Additional follow up Details #1::        ok for him to come in to get instruction on rescue inhaler use. ok to refer to pulmonary rehab if he will go. Additional Follow-up by: Barbaraann Share MD,  March 09, 2010 4:55 PM    Additional Follow-up for Phone Call Additional follow up Details #2::    called and spoke with pt and he is aware of order sent to [facility] for pulmonary rehab order.  he will stop by next week sometime to go over his inhaler instructions---i told him to ask for me and i would be more than happy to review that with him Randell Loop CMA  March 09, 2010 5:01 PM

## 2010-10-04 NOTE — Procedures (Signed)
Summary: Colonoscopy  Patient: Brad Turner Note: All result statuses are Final unless otherwise noted.  Tests: (1) Colonoscopy (COL)   COL Colonoscopy           DONE     [facility]     [address]     [place], [place]  [address]           COLONOSCOPY PROCEDURE REPORT           PATIENT:  Brad, Turner  MR#:  [id number]     BIRTHDATE:  1941-02-06, 69 yrs. old  GENDER:  male           ENDOSCOPIST:  Barbette Hair. Arlyce Dice, MD     Referred by:           PROCEDURE DATE:  07/10/2010     PROCEDURE:  Colonoscopy with snare polypectomy     ASA CLASS:  Class II     INDICATIONS:  1) screening  2) history of pre-cancerous     (adenomatous) colon polyps Index polypectomy 2002           MEDICATIONS:   Fentanyl 75 mcg IV, Versed 9 mg IV           DESCRIPTION OF PROCEDURE:   After the risks benefits and     alternatives of the procedure were thoroughly explained, informed     consent was obtained.  Digital rectal exam was performed and     revealed no abnormalities.   The LB 180AL [id number] endoscope was     introduced through the anus and advanced to the cecum, which was     identified by the ileocecal valve, without limitations.  The     quality of the prep was good, using MoviPrep.  The instrument was     then slowly withdrawn as the colon was fully examined.     <<PROCEDUREIMAGES>>           FINDINGS:  A sessile polyp was found in the sigmoid colon. It was     3 mm in size. It was found 18 cm from the point of entry. Polyp     was snared without cautery. Retrieval was successful (see     image12). snare polyp  Scattered diverticula were found in the     sigmoid to descending colon segments (see image1).  This was     otherwise a normal examination of the colon (see image6, image7,     image8, image9, image13, and image14).   Retroflexed views in the     rectum revealed no abnormalities.    The time to cecum =  3.0     minutes. The scope was then withdrawn (time =  17.75  min) from  the     patient and the procedure completed.           COMPLICATIONS:  None           ENDOSCOPIC IMPRESSION:     1) 3 mm sessile polyp in the sigmoid colon     2) Diverticula, scattered in the sigmoid to descending colon     segments     3) Otherwise normal examination     RECOMMENDATIONS:     1) If the polyp(s) removed today are proven to be adenomatous     (pre-cancerous) polyps, you will need a repeat colonoscopy in 5     years. Otherwise you should continue to follow colorectal cancer     screening guidelines for "  routine risk" patients with colonoscopy     in 10 years.           REPEAT EXAM:   You will receive a letter from Dr. Arlyce Dice in 1-2     weeks, after reviewing the final pathology, with followup     recommendations.           ______________________________     Barbette Hair Arlyce Dice, MD           CC: Brad Bruin, MD           n.     Rosalie Doctor:   Barbette Hair. Kaplan at 07/10/2010 04:25 PM           Kudo, Casimiro Needle, [phone]  Note: An exclamation mark (!) indicates a result that was not dispersed into the flowsheet. Document Creation Date: 07/10/2010 4:26 PM _______________________________________________________________________  (1) Order result status: Final Collection or observation date-time: 07/10/2010 16:21 Requested date-time:  Receipt date-time:  Reported date-time:  Referring Physician:   Ordering Physician: Melvia Heaps [phone]) Specimen Source:  Source: Launa Grill Order Number: [id number] Lab site:   Appended Document: Colonoscopy     Procedures Next Due Date:    Colonoscopy: 07/2020

## 2010-10-05 NOTE — Letter (Signed)
Summary: Anticoagulation Modification Letter / [facility]  Anticoagulation Modification Letter / [facility]   Imported By: Lennie Odor 06/06/2010 14:02:44  _____________________________________________________________________  External Attachment:    Type:   Image     Comment:   External Document

## 2010-10-09 ENCOUNTER — Encounter (HOSPITAL_COMMUNITY): Payer: BC Managed Care – PPO

## 2010-10-11 ENCOUNTER — Encounter (HOSPITAL_COMMUNITY): Payer: BC Managed Care – PPO

## 2010-10-16 ENCOUNTER — Encounter (HOSPITAL_COMMUNITY): Payer: BC Managed Care – PPO

## 2010-10-18 ENCOUNTER — Encounter (HOSPITAL_COMMUNITY): Payer: BC Managed Care – PPO

## 2010-10-23 ENCOUNTER — Encounter (HOSPITAL_COMMUNITY): Payer: BC Managed Care – PPO

## 2010-10-25 ENCOUNTER — Encounter (HOSPITAL_COMMUNITY): Payer: BC Managed Care – PPO

## 2010-10-30 ENCOUNTER — Encounter (HOSPITAL_COMMUNITY): Payer: BC Managed Care – PPO

## 2010-11-01 ENCOUNTER — Encounter (HOSPITAL_COMMUNITY): Payer: BC Managed Care – PPO | Attending: Pulmonary Disease

## 2010-11-01 DIAGNOSIS — J4489 Other specified chronic obstructive pulmonary disease: Secondary | ICD-10-CM | POA: Insufficient documentation

## 2010-11-01 DIAGNOSIS — I10 Essential (primary) hypertension: Secondary | ICD-10-CM | POA: Insufficient documentation

## 2010-11-01 DIAGNOSIS — F17200 Nicotine dependence, unspecified, uncomplicated: Secondary | ICD-10-CM | POA: Insufficient documentation

## 2010-11-01 DIAGNOSIS — J449 Chronic obstructive pulmonary disease, unspecified: Secondary | ICD-10-CM | POA: Insufficient documentation

## 2010-11-01 DIAGNOSIS — Z7902 Long term (current) use of antithrombotics/antiplatelets: Secondary | ICD-10-CM | POA: Insufficient documentation

## 2010-11-01 DIAGNOSIS — I2510 Atherosclerotic heart disease of native coronary artery without angina pectoris: Secondary | ICD-10-CM | POA: Insufficient documentation

## 2010-11-01 DIAGNOSIS — E785 Hyperlipidemia, unspecified: Secondary | ICD-10-CM | POA: Insufficient documentation

## 2010-11-01 DIAGNOSIS — Z7982 Long term (current) use of aspirin: Secondary | ICD-10-CM | POA: Insufficient documentation

## 2010-11-01 DIAGNOSIS — Z5189 Encounter for other specified aftercare: Secondary | ICD-10-CM | POA: Insufficient documentation

## 2010-11-01 DIAGNOSIS — Z9861 Coronary angioplasty status: Secondary | ICD-10-CM | POA: Insufficient documentation

## 2010-11-01 DIAGNOSIS — Z8673 Personal history of transient ischemic attack (TIA), and cerebral infarction without residual deficits: Secondary | ICD-10-CM | POA: Insufficient documentation

## 2010-11-01 DIAGNOSIS — K219 Gastro-esophageal reflux disease without esophagitis: Secondary | ICD-10-CM | POA: Insufficient documentation

## 2010-11-06 ENCOUNTER — Encounter (HOSPITAL_COMMUNITY): Payer: BC Managed Care – PPO

## 2010-11-06 ENCOUNTER — Ambulatory Visit (INDEPENDENT_AMBULATORY_CARE_PROVIDER_SITE_OTHER): Payer: BC Managed Care – PPO | Admitting: Pulmonary Disease

## 2010-11-06 ENCOUNTER — Encounter: Payer: Self-pay | Admitting: Pulmonary Disease

## 2010-11-06 DIAGNOSIS — J438 Other emphysema: Secondary | ICD-10-CM

## 2010-11-08 ENCOUNTER — Encounter (HOSPITAL_COMMUNITY): Payer: BC Managed Care – PPO

## 2010-11-13 ENCOUNTER — Encounter (HOSPITAL_COMMUNITY): Payer: BC Managed Care – PPO

## 2010-11-15 ENCOUNTER — Encounter (HOSPITAL_COMMUNITY): Payer: BC Managed Care – PPO

## 2010-11-20 ENCOUNTER — Encounter (HOSPITAL_COMMUNITY): Payer: BC Managed Care – PPO

## 2010-11-20 NOTE — Assessment & Plan Note (Signed)
Summary: rov for emphysema   Copy to:  Guerry Bruin Primary Provider/Referring Provider:  Guerry Bruin, M.D.  CC:  4 month f/u appt for emphysema.  pt states he stopped the spiriva and states the dryness in mouth has improved.  pt states he lost rx for Chantix and is requesting a new one.  pt is still smoking 1/2 to 1 ppd.   Pt states breathing has improved since last visit.  States only an occ cough with scant amounts sputum.  Pt states he is currently in pulm rehab. Marland Kitchen  History of Present Illness: the pt comes in today for f/u of his known emphysema.  He stopped spiriva after last visit, and his dry mouth has improved.  He is staying on symbicort, and feels his exertional tolerance is at baseline.  Unfortunately, he is still continuing to smoke.  He lost his last chantix prescription, but would like to try the medication again.  He is enrolled in pulmonary rehab.  Preventive Screening-Counseling & Management  Alcohol-Tobacco     Smoking Status: current     Smoking Cessation Counseling: yes     Packs/Gama: 1.0     Tobacco Counseling: to quit use of tobacco products  Current Medications (verified): 1)  Plavix 75 Mg Tabs (Clopidogrel Bisulfate) .Marland Kitchen.. 1 Tablet By Mouth Once Daily 2)  Lipitor 80 Mg Tabs (Atorvastatin Calcium) .Marland Kitchen.. 1 Tablet By Mouth Once Daily 3)  Tricor 145 Mg Tabs (Fenofibrate) .Marland Kitchen.. 1 Tablet By Mouth Once Daily 4)  Amlodipine Besylate 10 Mg Tabs (Amlodipine Besylate) .Marland Kitchen.. 1 Tablet By Mouth Once Daily 5)  Ramipril 10 Mg Caps (Ramipril) .Marland Kitchen.. 1 Tablet By Mouth Once Daily 6)  Triamterene-Hctz 37.5-25 Mg Caps (Triamterene-Hctz) .... 1/2 Tablet By Mouth Once Daily 7)  Pantoprazole Sodium 40 Mg Tbec (Pantoprazole Sodium) .... Take 1 Tablet By Mouth Once A Bordner 8)  Aspirin 81 Mg Tbec (Aspirin) .Marland Kitchen.. 1 Tablet By Mouth Once Daily 9)  Folic Acid 400 Mcg Tabs (Folic Acid) .Marland Kitchen.. 1 Tablet By Mouth Once Daily 10)  Symbicort 160-4.5 Mcg/act  Aero (Budesonide-Formoterol Fumarate) .... Two  Puffs Twice Daily 11)  Proair Hfa 108 (90 Base) Mcg/act  Aers (Albuterol Sulfate) .... 2 Puffs Every 4-6 Hours As Needed 12)  Eyelea Injection .... Injection Once A Month in R Eye For Macular Degeneration.  Allergies (verified): 1)  ! Macrodantin 2)  ! * Ivp Dye  Social History: Patient currently smokes 1/2 to 1 ppd.  started at age 42.  pt has decreased smoking down to 5 to 6 cigs daily.  pt is single , has children, and lives with son.   pt works as a Airline pilot.   Alcohol Use - yes Daily Caffeine Use Packs/Radle:  1.0  Review of Systems       The patient complains of productive cough, chest pain, tooth/dental problems, headaches, nasal congestion/difficulty breathing through nose, and joint stiffness or pain.  The patient denies shortness of breath with activity, shortness of breath at rest, non-productive cough, coughing up blood, irregular heartbeats, acid heartburn, indigestion, loss of appetite, weight change, abdominal pain, difficulty swallowing, sore throat, sneezing, itching, ear ache, anxiety, depression, hand/feet swelling, rash, change in color of mucus, and fever.    Vital Signs:  Patient profile:   70 year old male Height:      66 inches Weight:      173 pounds BMI:     28.02 O2 Sat:      98 % on Room air Temp:  97.9 degrees F oral Pulse rate:   86 / minute BP sitting:   100 / 62  (left arm) Cuff size:   regular  Vitals Entered By: Arman Filter LPN (November 05, 1608 9:19 AM)  O2 Flow:  Room air CC: 4 month f/u appt for emphysema.  pt states he stopped the spiriva and states the dryness in mouth has improved.  pt states he lost rx for Chantix and is requesting a new one.  pt is still smoking 1/2 to 1 ppd.   Pt states breathing has improved since last visit.  States only an occ cough with scant amounts sputum.  Pt states he is currently in pulm rehab.  Comments Medications reviewed with patient Arman Filter LPN  November 06, 9602 9:19 AM    Physical  Exam  General:  ow male in nad  Lungs:  decreased bs, no wheezing Heart:  rrr, no mrg  Extremities:  no edema or cyanosis  Neurologic:  alert and oriented, moves all 4    Impression & Recommendations:  Problem # 1:  EMPHYSEMA (ICD-492.8) the pt is maintaining a stable baseline on symbicort alone, but I have told him the key to further improvement is smoking cessation.  I have given him a script for chantix.  I also encouraged him to stay in pulmonary rehab.    Medications Added to Medication List This Visit: 1)  Eyelea Injection  .... Injection once a month in r eye for macular degeneration. 2)  Chantix Starting Month Pak 0.5 Mg X 11 & 1 Mg X 42 Misc (Varenicline tartrate) .... Take as directed---refills are to be for the continuing pak 3)  Chantix Continuing Month Pak 1 Mg Tabs (Varenicline tartrate) .... One in am and pm  Other Orders: Est. Patient Level III (54098) Tobacco use cessation intermediate 3-10 minutes (11914)  Patient Instructions: 1)  continue with symbicort 2)  work on smoking cessation.  Will give you another prescription 3)  stay active as possible 4)  followup with me in 6mos  Prescriptions: CHANTIX CONTINUING MONTH PAK 1 MG TABS (VARENICLINE TARTRATE) one in am and pm  #1 x 2   Entered and Authorized by:   Barbaraann Share MD   Signed by:   Barbaraann Share MD on 11/06/2010   Method used:   Print then Give to Patient   RxID:   [id number] CHANTIX STARTING MONTH PAK 0.5 MG X 11 & 1 MG X 42  MISC (VARENICLINE TARTRATE) Take as directed---Refills are to be for the continuing pak  #1 x 0   Entered and Authorized by:   Barbaraann Share MD   Signed by:   Barbaraann Share MD on 11/06/2010   Method used:   Print then Give to Patient   RxID:   [id number]

## 2010-11-22 ENCOUNTER — Encounter (HOSPITAL_COMMUNITY): Payer: BC Managed Care – PPO

## 2010-11-26 LAB — BASIC METABOLIC PANEL
BUN: 17 mg/dL (ref 6–23)
Calcium: 8.5 mg/dL (ref 8.4–10.5)
Chloride: 105 mEq/L (ref 96–112)
Creatinine, Ser: 1.36 mg/dL (ref 0.4–1.5)
GFR calc Af Amer: >60 mL/min (ref >60)
Sodium: 138 mEq/L (ref 135–145)

## 2010-11-26 LAB — CBC
HCT: 39.9 % (ref 39.0–52.0)
MCHC: 33.7 g/dL (ref 30.0–36.0)
MCV: 95.4 fL (ref 78.0–100.0)
Platelets: 353 10*3/uL (ref 150–400)
RDW: 13.8 % (ref 11.5–15.5)

## 2010-11-27 ENCOUNTER — Encounter (HOSPITAL_COMMUNITY): Payer: BC Managed Care – PPO

## 2010-11-29 ENCOUNTER — Encounter (HOSPITAL_COMMUNITY): Payer: BC Managed Care – PPO

## 2010-12-04 ENCOUNTER — Encounter (HOSPITAL_COMMUNITY): Payer: Self-pay | Attending: Cardiology

## 2010-12-04 ENCOUNTER — Encounter (HOSPITAL_COMMUNITY): Payer: BC Managed Care – PPO

## 2010-12-04 DIAGNOSIS — Z5189 Encounter for other specified aftercare: Secondary | ICD-10-CM | POA: Insufficient documentation

## 2010-12-04 DIAGNOSIS — Z9861 Coronary angioplasty status: Secondary | ICD-10-CM | POA: Insufficient documentation

## 2010-12-04 DIAGNOSIS — J4489 Other specified chronic obstructive pulmonary disease: Secondary | ICD-10-CM | POA: Insufficient documentation

## 2010-12-04 DIAGNOSIS — J449 Chronic obstructive pulmonary disease, unspecified: Secondary | ICD-10-CM | POA: Insufficient documentation

## 2010-12-04 DIAGNOSIS — K219 Gastro-esophageal reflux disease without esophagitis: Secondary | ICD-10-CM | POA: Insufficient documentation

## 2010-12-04 DIAGNOSIS — I2510 Atherosclerotic heart disease of native coronary artery without angina pectoris: Secondary | ICD-10-CM | POA: Insufficient documentation

## 2010-12-04 DIAGNOSIS — E785 Hyperlipidemia, unspecified: Secondary | ICD-10-CM | POA: Insufficient documentation

## 2010-12-04 DIAGNOSIS — I10 Essential (primary) hypertension: Secondary | ICD-10-CM | POA: Insufficient documentation

## 2010-12-04 DIAGNOSIS — F17200 Nicotine dependence, unspecified, uncomplicated: Secondary | ICD-10-CM | POA: Insufficient documentation

## 2010-12-04 DIAGNOSIS — Z7902 Long term (current) use of antithrombotics/antiplatelets: Secondary | ICD-10-CM | POA: Insufficient documentation

## 2010-12-04 DIAGNOSIS — Z7982 Long term (current) use of aspirin: Secondary | ICD-10-CM | POA: Insufficient documentation

## 2010-12-04 DIAGNOSIS — Z8673 Personal history of transient ischemic attack (TIA), and cerebral infarction without residual deficits: Secondary | ICD-10-CM | POA: Insufficient documentation

## 2010-12-06 ENCOUNTER — Encounter (HOSPITAL_COMMUNITY): Payer: BC Managed Care – PPO

## 2010-12-06 ENCOUNTER — Encounter (HOSPITAL_COMMUNITY): Payer: Self-pay

## 2010-12-07 ENCOUNTER — Encounter (HOSPITAL_COMMUNITY): Payer: Self-pay

## 2010-12-10 ENCOUNTER — Encounter (HOSPITAL_COMMUNITY): Payer: Self-pay

## 2010-12-11 ENCOUNTER — Encounter (HOSPITAL_COMMUNITY): Payer: Self-pay

## 2010-12-11 ENCOUNTER — Encounter (HOSPITAL_COMMUNITY): Payer: BC Managed Care – PPO

## 2010-12-13 ENCOUNTER — Encounter (HOSPITAL_COMMUNITY): Payer: Self-pay

## 2010-12-13 ENCOUNTER — Encounter (HOSPITAL_COMMUNITY): Payer: BC Managed Care – PPO

## 2010-12-17 ENCOUNTER — Encounter (HOSPITAL_COMMUNITY): Payer: Self-pay

## 2010-12-18 ENCOUNTER — Encounter (HOSPITAL_COMMUNITY): Payer: BC Managed Care – PPO

## 2010-12-18 ENCOUNTER — Encounter (HOSPITAL_COMMUNITY): Payer: Self-pay

## 2010-12-20 ENCOUNTER — Encounter (HOSPITAL_COMMUNITY): Payer: BC Managed Care – PPO

## 2010-12-20 ENCOUNTER — Encounter (HOSPITAL_COMMUNITY): Payer: Self-pay

## 2010-12-24 ENCOUNTER — Encounter (HOSPITAL_COMMUNITY): Payer: Self-pay

## 2010-12-25 ENCOUNTER — Encounter (HOSPITAL_COMMUNITY): Payer: BC Managed Care – PPO

## 2010-12-25 ENCOUNTER — Encounter (HOSPITAL_COMMUNITY): Payer: Self-pay

## 2010-12-27 ENCOUNTER — Encounter (HOSPITAL_COMMUNITY): Payer: BC Managed Care – PPO

## 2010-12-27 ENCOUNTER — Encounter (HOSPITAL_COMMUNITY): Payer: Self-pay

## 2010-12-31 ENCOUNTER — Encounter (HOSPITAL_COMMUNITY): Payer: Self-pay

## 2011-01-01 ENCOUNTER — Encounter (HOSPITAL_COMMUNITY): Payer: BC Managed Care – PPO

## 2011-01-01 ENCOUNTER — Encounter (HOSPITAL_COMMUNITY): Payer: Self-pay | Attending: Cardiology

## 2011-01-01 DIAGNOSIS — Z9861 Coronary angioplasty status: Secondary | ICD-10-CM | POA: Insufficient documentation

## 2011-01-01 DIAGNOSIS — E785 Hyperlipidemia, unspecified: Secondary | ICD-10-CM | POA: Insufficient documentation

## 2011-01-01 DIAGNOSIS — Z8673 Personal history of transient ischemic attack (TIA), and cerebral infarction without residual deficits: Secondary | ICD-10-CM | POA: Insufficient documentation

## 2011-01-01 DIAGNOSIS — I2510 Atherosclerotic heart disease of native coronary artery without angina pectoris: Secondary | ICD-10-CM | POA: Insufficient documentation

## 2011-01-01 DIAGNOSIS — Z7982 Long term (current) use of aspirin: Secondary | ICD-10-CM | POA: Insufficient documentation

## 2011-01-01 DIAGNOSIS — Z5189 Encounter for other specified aftercare: Secondary | ICD-10-CM | POA: Insufficient documentation

## 2011-01-01 DIAGNOSIS — Z7902 Long term (current) use of antithrombotics/antiplatelets: Secondary | ICD-10-CM | POA: Insufficient documentation

## 2011-01-01 DIAGNOSIS — J4489 Other specified chronic obstructive pulmonary disease: Secondary | ICD-10-CM | POA: Insufficient documentation

## 2011-01-01 DIAGNOSIS — J449 Chronic obstructive pulmonary disease, unspecified: Secondary | ICD-10-CM | POA: Insufficient documentation

## 2011-01-01 DIAGNOSIS — F17200 Nicotine dependence, unspecified, uncomplicated: Secondary | ICD-10-CM | POA: Insufficient documentation

## 2011-01-01 DIAGNOSIS — K219 Gastro-esophageal reflux disease without esophagitis: Secondary | ICD-10-CM | POA: Insufficient documentation

## 2011-01-01 DIAGNOSIS — I10 Essential (primary) hypertension: Secondary | ICD-10-CM | POA: Insufficient documentation

## 2011-01-03 ENCOUNTER — Encounter (HOSPITAL_COMMUNITY): Payer: BC Managed Care – PPO

## 2011-01-03 ENCOUNTER — Encounter (HOSPITAL_COMMUNITY): Payer: Self-pay

## 2011-01-07 ENCOUNTER — Encounter (HOSPITAL_COMMUNITY): Payer: Self-pay

## 2011-01-08 ENCOUNTER — Encounter (HOSPITAL_COMMUNITY): Payer: BC Managed Care – PPO

## 2011-01-08 ENCOUNTER — Encounter (HOSPITAL_COMMUNITY): Payer: Self-pay

## 2011-01-10 ENCOUNTER — Encounter (HOSPITAL_COMMUNITY): Payer: BC Managed Care – PPO

## 2011-01-11 ENCOUNTER — Encounter (HOSPITAL_COMMUNITY): Payer: Self-pay

## 2011-01-14 ENCOUNTER — Encounter (HOSPITAL_COMMUNITY): Payer: Self-pay

## 2011-01-15 ENCOUNTER — Encounter (HOSPITAL_COMMUNITY): Payer: BC Managed Care – PPO

## 2011-01-15 ENCOUNTER — Encounter (HOSPITAL_COMMUNITY): Payer: Self-pay

## 2011-01-17 ENCOUNTER — Encounter (HOSPITAL_COMMUNITY): Payer: Self-pay

## 2011-01-17 ENCOUNTER — Encounter (HOSPITAL_COMMUNITY): Payer: BC Managed Care – PPO

## 2011-01-18 NOTE — Op Note (Signed)
[facility]  Patient:    Brad Turner, Brad Turner Visit Number: [id number] MRN: [id number]          Service Type: SUR Location: [id number] Attending Physician:  Marlowe Kays Page Dictated by:   Illene Labrador. Aplington, M.D. Proc. Date: 02/16/02 Admit Date:  02/16/2002 Discharge Date: 02/17/2002                             Operative Report  PREOPERATIVE DIAGNOSES: 1. Degenerative arthritis, acromioclavicular joint. 2. Labral degeneration, glenohumeral joint. 3. Chronic impingement syndrome, left shoulder.  POSTOPERATIVE DIAGNOSES: 1. Degenerative arthritis, acromioclavicular joint. 2. Labral degeneration, glenohumeral joint. 3. Chronic impingement syndrome, left shoulder.  OPERATIONS: 1. Local resection, distal left clavicle. 2. Left shoulder arthroscopy with:    a. Debridement of labral degeneration.    b. Arthroscopic subacromial decompression.  SURGEON:  Illene Labrador. Aplington, M.D.  ASSISTANT:  Mr. Idolina Primer, P.A.-C.  ANESTHESIA:  General.  INDICATIONS FOR PROCEDURE:  The patient has had bilateral shoulder disease and required an open rotator cuff repair on the right. The MRI showed some findings consistent with a chronic impingement syndrome along with degenerative arthritis of the [facility] joint which was tender and a good bit of labral degeneration, all of which is confirmed at surgery.  PROCEDURE:  Satisfactory general anesthesia, there was a very difficult induction. He was placed in the beach-chair position on the [place] frame. The left shoulder girdle was prepped with DuraPrep and draped in a sterile field. Anatomy of his shoulder was marked out and the [facility] joint, subacromial space, and lateral and posterior portals were infiltrated with 0.5% Marcaine with adrenalin. Through a posterior soft spot portal, I easily entered the glenohumeral joint. The humeral head, long head of the biceps tendon on each surface of the rotator cuff were all normal. He  had extensive degeneration of the labrum with large strands which could very well become entrapped in the joint but no frank tear. Accordingly, I advanced the scope anteriorly into the anterior capsule beneath the long head of the biceps tendon and using a switching stick, made an anterior portal layer, prepared to place the smooth metal cannula over the top of the switching stick and introduced it in the joint followed by a 4.2 shaver and then shaved down the labral degeneration until smooth. I then removed all fluid possible from the glenohumeral joint and redirected the scope in the subacromial area. He had a large amount of bursal tissue present. Through a lateral portal with a 4.2 shaver, I resected a good bit of this bursal tissue to allow visualization. I then introduced an Arthrocare vaporizer and then began removing soft tissue from the underlying surface of the acromion completing the coracoacromial ligament back to the [facility] joint. Then using four 0 oval burr through the lateral portal to begin burring down the large spurs. He had a significant impingement problem prior to the burring, which I pictured. I went back and forth between the burr and the Arthocare vaporizer until all soft tissue and impingement had been completely corrected. Final pictures were taken with his arm down to his side and with his arm abducted. I then removed all fluid possible from the subacromial joint and made an incision over the distal clavicle. With subperiosteal dissection, I exposed the distal clavicle and AC joint and measured off the distal 1.5 cm marking this with a Bovie and then gently placed baby Homans beneath the clavicle and  used the microsaw to cut the clavicle at this point. Small remaining spicules of bone posteriorly were removed with a rongeur. Small bleeder were coagulated with regular cautery and placed bone wax over the raw bone and packed the gap from the clavicle resection with  Gelfoam. But first I did check to be sure that when I brought his arm across his chest there was no residual impingement. I then closed the fascia over the remaining distal clavicle and the amputation site with interrupted #1 Vicryl, subcutaneous tissue with 3-0 Vicryl, and the skin with Steri-Strips. The three portals were closed with 4-0 nylon. Dry sterile Betadine over the portals, Adaptic and dry sterile dressing were applied. His arm was then placed in a shoulder immobilizer. He tolerated the procedure well. At the time of this dictation, he was on his way to recovery room in satisfactory condition with no complications. Dictated by:   Illene Labrador. Aplington, M.D. Attending Physician:  Joaquin Courts DD:  02/16/02 TD:  02/17/02 Job: [id number]

## 2011-01-18 NOTE — H&P (Signed)
[facility]  Patient:    Brad Turner, Brad Turner Visit Number: [id number] MRN: [id number]          Service Type: Attending:  Illene Labrador. Aplington, M.D. Dictated by:   Alexzandrew L. Perkins, P.A.-C. Adm. Date:  02/16/02                           History and Physical  DATE OF BIRTH:  1941/02/17  CHIEF COMPLAINT:  Left shoulder pain.  HISTORY OF PRESENT ILLNESS:  The patient is a 70 year old male who has previously undergone right rotator cuff repair, right rotator cuff surgery by Dr. Simonne Come.  He is doing quite well.  He still has continued complaints and weakness about the left shoulder.  He has been followed in the postoperative period concerning his right shoulder and, now that he is improved, it is felt he has reached a point where he could benefit from undergoing surgical intervention concerning the left.  The risks and benefits of the left shoulder surgery have been discussed with the patient.  He has elected to proceed with surgery.  ALLERGIES/INTOLERANCES:  IV DYE causes flushing and shortness of breath. MACRODANTIN causes an elevated temperature.  CURRENT MEDICATIONS:  Lipitor, Plavix, Norvasc, triamterene, Altace, baby aspirin, and Prilosec.  He will stop the Plavix three days prior to his surgery upon his neurologists recommendation.  He will stop the aspirin one week prior to surgery.  The patient does not have his medications and does not recall all the dosages.  He will bring these to the hospital with him.  PAST MEDICAL HISTORY: 1. Hypercholesterolemia. 2. Basilar artery stroke. 3. Coronary arterial disease. 4. Cardiac catheterization with balloon angioplasty x 3. 5. Hypertension. 6. Osteoarthritis.  PAST SURGICAL HISTORY: 1. Right shoulder surgery in April 2003. 2. Parathyroid tumor excision in 1977. 3. Tonsillectomy, adenoidectomy as a child. 4. Appendectomy in 1965.  SOCIAL HISTORY:  He is single, has one adopted son.  Smoker  of one pack per [place].  He does have approximately two vodkas of alcohol intake a [place].  FAMILY HISTORY:  Father deceased, age 42, with coronary arterial disease, heart failure, and stroke.  Mother deceased, age 40, secondary to stroke.  REVIEW OF SYSTEMS:  GENERAL:  No fevers, chills, or night sweats.  NEUROLOGIC: No seizures, syncope, or paralysis at this time.  He had some temporary paralysis following his previous stroke; however, this has resolved. RESPIRATORY:  No shortness of breath, productive cough, or hemoptysis. CARDIOVASCULAR:  No chest pain, angina, or orthopnea.  GASTROINTESTINAL:  No nausea, vomiting, diarrhea, or constipation.  No blood or mucous in the stool. GENITOURINARY:  No dysuria, hematuria, or discharge.  MUSCULOSKELETAL: Pertinent to the left shoulder found in history of present illness.  PHYSICAL EXAMINATION:  VITAL SIGNS:  Pulse 88, respirations 16, blood pressure 130/78.  GENERAL:  The patient is a 70 year old white male, short in stature. Well-nourished, well-developed.  Appears to be in no acute distress.  He is alert, oriented, and cooperative.  Appears to be an excellent historian.  HEENT:  Normocephalic, atraumatic.  Pupils are round and reactive.  EOMs are intact.  NECK:  Supple.  No carotid bruits are appreciated.  CHEST:  Clear to auscultation in anterior and posterior chest walls.  No rhonchi, rales, or wheezing appreciated.  HEART:  Regular rate and rhythm.  S1, S2 noted.  No rubs, thrills, palpitations.  ABDOMEN:  Soft, flat, nontender.  Bowel sounds are present.  RECTAL,  BREASTS, GENITOURINARY:  Not done, not pertinent to present illness.  EXTREMITIES:  Limited to that of the left upper extremity.  He is tender in and about the anterior shoulder, especially over the [facility] joint and anterior humeral head.  Some tenderness over the bicipital groove.  Motor function is grossly intact.  Neurovascular is grossly intact to the left upper  extremity.  IMPRESSION: 1. Left shoulder impingement syndrome. 2. Chronic rotator cuff tendinopathy. 3. Hypercholesterolemia. 4. History of basilar artery stroke. 5. Cardiac catheterization with angioplasty x 3. 6. Coronary arterial disease. 7. Hypertension. 8. Osteoarthritis.  PLAN:  The patient will be admitted to [facility] to undergo a left shoulder arthroscopy with subacromial decompression and open distal clavicle resection.  The surgery will be performed by Dr. Marlowe Kays. Dictated by:   Alexzandrew L. Perkins, P.A.-C. Attending:  Illene Labrador. Aplington, M.D. DD:  02/05/02 TD:  02/07/02 Job: [id number]

## 2011-01-21 ENCOUNTER — Encounter (HOSPITAL_COMMUNITY): Payer: Self-pay

## 2011-01-22 ENCOUNTER — Encounter (HOSPITAL_COMMUNITY): Payer: BC Managed Care – PPO

## 2011-01-22 ENCOUNTER — Encounter (HOSPITAL_COMMUNITY): Payer: Self-pay

## 2011-01-24 ENCOUNTER — Encounter (HOSPITAL_COMMUNITY): Payer: Self-pay

## 2011-01-24 ENCOUNTER — Encounter (HOSPITAL_COMMUNITY): Payer: BC Managed Care – PPO

## 2011-01-28 ENCOUNTER — Encounter (HOSPITAL_COMMUNITY): Payer: Self-pay

## 2011-01-29 ENCOUNTER — Encounter (HOSPITAL_COMMUNITY): Payer: BC Managed Care – PPO

## 2011-01-30 ENCOUNTER — Encounter (HOSPITAL_COMMUNITY): Payer: Self-pay

## 2011-01-31 ENCOUNTER — Encounter (HOSPITAL_COMMUNITY): Payer: BC Managed Care – PPO

## 2011-02-01 ENCOUNTER — Encounter (HOSPITAL_COMMUNITY): Payer: Self-pay

## 2011-02-04 ENCOUNTER — Encounter (HOSPITAL_COMMUNITY): Payer: Self-pay | Attending: Cardiology

## 2011-02-04 ENCOUNTER — Encounter (HOSPITAL_COMMUNITY): Payer: Self-pay

## 2011-02-04 DIAGNOSIS — I2510 Atherosclerotic heart disease of native coronary artery without angina pectoris: Secondary | ICD-10-CM | POA: Insufficient documentation

## 2011-02-04 DIAGNOSIS — J4489 Other specified chronic obstructive pulmonary disease: Secondary | ICD-10-CM | POA: Insufficient documentation

## 2011-02-04 DIAGNOSIS — Z8673 Personal history of transient ischemic attack (TIA), and cerebral infarction without residual deficits: Secondary | ICD-10-CM | POA: Insufficient documentation

## 2011-02-04 DIAGNOSIS — Z7982 Long term (current) use of aspirin: Secondary | ICD-10-CM | POA: Insufficient documentation

## 2011-02-04 DIAGNOSIS — Z7902 Long term (current) use of antithrombotics/antiplatelets: Secondary | ICD-10-CM | POA: Insufficient documentation

## 2011-02-04 DIAGNOSIS — K219 Gastro-esophageal reflux disease without esophagitis: Secondary | ICD-10-CM | POA: Insufficient documentation

## 2011-02-04 DIAGNOSIS — Z5189 Encounter for other specified aftercare: Secondary | ICD-10-CM | POA: Insufficient documentation

## 2011-02-04 DIAGNOSIS — F17200 Nicotine dependence, unspecified, uncomplicated: Secondary | ICD-10-CM | POA: Insufficient documentation

## 2011-02-04 DIAGNOSIS — E785 Hyperlipidemia, unspecified: Secondary | ICD-10-CM | POA: Insufficient documentation

## 2011-02-04 DIAGNOSIS — I10 Essential (primary) hypertension: Secondary | ICD-10-CM | POA: Insufficient documentation

## 2011-02-04 DIAGNOSIS — J449 Chronic obstructive pulmonary disease, unspecified: Secondary | ICD-10-CM | POA: Insufficient documentation

## 2011-02-04 DIAGNOSIS — Z9861 Coronary angioplasty status: Secondary | ICD-10-CM | POA: Insufficient documentation

## 2011-02-05 ENCOUNTER — Encounter (HOSPITAL_COMMUNITY): Payer: BC Managed Care – PPO

## 2011-02-05 ENCOUNTER — Encounter (HOSPITAL_COMMUNITY): Payer: Self-pay

## 2011-02-06 ENCOUNTER — Encounter (HOSPITAL_COMMUNITY): Payer: Self-pay

## 2011-02-07 ENCOUNTER — Encounter (HOSPITAL_COMMUNITY): Payer: BC Managed Care – PPO

## 2011-02-07 ENCOUNTER — Encounter (HOSPITAL_COMMUNITY): Payer: Self-pay

## 2011-02-08 ENCOUNTER — Encounter (HOSPITAL_COMMUNITY): Payer: Self-pay

## 2011-02-11 ENCOUNTER — Encounter (HOSPITAL_COMMUNITY): Payer: Self-pay

## 2011-02-12 ENCOUNTER — Encounter (HOSPITAL_COMMUNITY): Payer: BC Managed Care – PPO

## 2011-02-12 ENCOUNTER — Encounter (HOSPITAL_COMMUNITY): Payer: Self-pay

## 2011-02-13 ENCOUNTER — Encounter (HOSPITAL_COMMUNITY): Payer: Self-pay

## 2011-02-14 ENCOUNTER — Encounter (HOSPITAL_COMMUNITY): Payer: Self-pay

## 2011-02-14 ENCOUNTER — Encounter (HOSPITAL_COMMUNITY): Payer: BC Managed Care – PPO

## 2011-02-15 ENCOUNTER — Encounter (HOSPITAL_COMMUNITY): Payer: Self-pay

## 2011-02-18 ENCOUNTER — Encounter (HOSPITAL_COMMUNITY): Payer: Self-pay

## 2011-02-19 ENCOUNTER — Encounter (HOSPITAL_COMMUNITY): Payer: Self-pay

## 2011-02-19 ENCOUNTER — Encounter (HOSPITAL_COMMUNITY): Payer: BC Managed Care – PPO

## 2011-02-20 ENCOUNTER — Encounter (HOSPITAL_COMMUNITY): Payer: Self-pay

## 2011-02-21 ENCOUNTER — Encounter (HOSPITAL_COMMUNITY): Payer: BC Managed Care – PPO

## 2011-02-21 ENCOUNTER — Encounter (HOSPITAL_COMMUNITY): Payer: Self-pay

## 2011-02-22 ENCOUNTER — Encounter (HOSPITAL_COMMUNITY): Payer: Self-pay

## 2011-02-25 ENCOUNTER — Encounter (HOSPITAL_COMMUNITY): Payer: Self-pay

## 2011-02-26 ENCOUNTER — Encounter (HOSPITAL_COMMUNITY): Payer: Self-pay

## 2011-02-26 ENCOUNTER — Encounter (HOSPITAL_COMMUNITY): Payer: BC Managed Care – PPO

## 2011-02-27 ENCOUNTER — Encounter (HOSPITAL_COMMUNITY): Payer: Self-pay

## 2011-02-28 ENCOUNTER — Encounter (HOSPITAL_COMMUNITY): Payer: Self-pay

## 2011-02-28 ENCOUNTER — Encounter (HOSPITAL_COMMUNITY): Payer: BC Managed Care – PPO

## 2011-03-01 ENCOUNTER — Encounter (HOSPITAL_COMMUNITY): Payer: Self-pay

## 2011-03-04 ENCOUNTER — Encounter (HOSPITAL_COMMUNITY): Payer: Self-pay

## 2011-03-04 ENCOUNTER — Encounter (HOSPITAL_COMMUNITY): Payer: Self-pay | Attending: Cardiology

## 2011-03-04 DIAGNOSIS — K219 Gastro-esophageal reflux disease without esophagitis: Secondary | ICD-10-CM | POA: Insufficient documentation

## 2011-03-04 DIAGNOSIS — E785 Hyperlipidemia, unspecified: Secondary | ICD-10-CM | POA: Insufficient documentation

## 2011-03-04 DIAGNOSIS — Z8673 Personal history of transient ischemic attack (TIA), and cerebral infarction without residual deficits: Secondary | ICD-10-CM | POA: Insufficient documentation

## 2011-03-04 DIAGNOSIS — Z7902 Long term (current) use of antithrombotics/antiplatelets: Secondary | ICD-10-CM | POA: Insufficient documentation

## 2011-03-04 DIAGNOSIS — I2510 Atherosclerotic heart disease of native coronary artery without angina pectoris: Secondary | ICD-10-CM | POA: Insufficient documentation

## 2011-03-04 DIAGNOSIS — J4489 Other specified chronic obstructive pulmonary disease: Secondary | ICD-10-CM | POA: Insufficient documentation

## 2011-03-04 DIAGNOSIS — Z5189 Encounter for other specified aftercare: Secondary | ICD-10-CM | POA: Insufficient documentation

## 2011-03-04 DIAGNOSIS — I10 Essential (primary) hypertension: Secondary | ICD-10-CM | POA: Insufficient documentation

## 2011-03-04 DIAGNOSIS — Z9861 Coronary angioplasty status: Secondary | ICD-10-CM | POA: Insufficient documentation

## 2011-03-04 DIAGNOSIS — F17200 Nicotine dependence, unspecified, uncomplicated: Secondary | ICD-10-CM | POA: Insufficient documentation

## 2011-03-04 DIAGNOSIS — J449 Chronic obstructive pulmonary disease, unspecified: Secondary | ICD-10-CM | POA: Insufficient documentation

## 2011-03-04 DIAGNOSIS — Z7982 Long term (current) use of aspirin: Secondary | ICD-10-CM | POA: Insufficient documentation

## 2011-03-05 ENCOUNTER — Encounter (HOSPITAL_COMMUNITY): Payer: BC Managed Care – PPO

## 2011-03-05 ENCOUNTER — Encounter (HOSPITAL_COMMUNITY): Payer: Self-pay

## 2011-03-06 ENCOUNTER — Encounter (HOSPITAL_COMMUNITY): Payer: Self-pay

## 2011-03-07 ENCOUNTER — Encounter (HOSPITAL_COMMUNITY): Payer: BC Managed Care – PPO

## 2011-03-07 ENCOUNTER — Encounter (HOSPITAL_COMMUNITY): Payer: Self-pay

## 2011-03-08 ENCOUNTER — Encounter (HOSPITAL_COMMUNITY): Payer: Self-pay

## 2011-03-11 ENCOUNTER — Encounter (HOSPITAL_COMMUNITY): Payer: Self-pay

## 2011-03-12 ENCOUNTER — Encounter (HOSPITAL_COMMUNITY): Payer: Self-pay

## 2011-03-12 ENCOUNTER — Encounter (HOSPITAL_COMMUNITY): Payer: BC Managed Care – PPO

## 2011-03-13 ENCOUNTER — Encounter (HOSPITAL_COMMUNITY): Payer: Self-pay

## 2011-03-14 ENCOUNTER — Encounter (HOSPITAL_COMMUNITY): Payer: BC Managed Care – PPO

## 2011-03-14 ENCOUNTER — Encounter (HOSPITAL_COMMUNITY): Payer: Self-pay

## 2011-03-15 ENCOUNTER — Encounter (HOSPITAL_COMMUNITY): Payer: Self-pay

## 2011-03-18 ENCOUNTER — Encounter (HOSPITAL_COMMUNITY): Payer: Self-pay

## 2011-03-19 ENCOUNTER — Encounter (HOSPITAL_COMMUNITY): Payer: Self-pay

## 2011-03-19 ENCOUNTER — Encounter (HOSPITAL_COMMUNITY): Payer: BC Managed Care – PPO

## 2011-03-20 ENCOUNTER — Encounter (HOSPITAL_COMMUNITY): Payer: Self-pay

## 2011-03-21 ENCOUNTER — Encounter (HOSPITAL_COMMUNITY): Payer: Self-pay

## 2011-03-21 ENCOUNTER — Encounter (HOSPITAL_COMMUNITY): Payer: BC Managed Care – PPO

## 2011-03-22 ENCOUNTER — Encounter (HOSPITAL_COMMUNITY): Payer: Self-pay

## 2011-03-25 ENCOUNTER — Encounter (HOSPITAL_COMMUNITY): Payer: Self-pay

## 2011-03-26 ENCOUNTER — Encounter (HOSPITAL_COMMUNITY): Payer: BC Managed Care – PPO

## 2011-03-26 ENCOUNTER — Encounter (HOSPITAL_COMMUNITY): Payer: Self-pay

## 2011-03-27 ENCOUNTER — Encounter (HOSPITAL_COMMUNITY): Payer: Self-pay

## 2011-03-28 ENCOUNTER — Encounter (HOSPITAL_COMMUNITY): Payer: BC Managed Care – PPO

## 2011-03-28 ENCOUNTER — Encounter (HOSPITAL_COMMUNITY): Payer: Self-pay

## 2011-03-29 ENCOUNTER — Encounter (HOSPITAL_COMMUNITY): Payer: Self-pay

## 2011-04-01 ENCOUNTER — Encounter (HOSPITAL_COMMUNITY): Payer: Self-pay

## 2011-04-02 ENCOUNTER — Encounter (HOSPITAL_COMMUNITY): Payer: BC Managed Care – PPO

## 2011-04-02 ENCOUNTER — Encounter (HOSPITAL_COMMUNITY): Payer: Self-pay

## 2011-04-03 ENCOUNTER — Encounter (HOSPITAL_COMMUNITY): Payer: Self-pay

## 2011-04-04 ENCOUNTER — Encounter (HOSPITAL_COMMUNITY): Payer: BC Managed Care – PPO

## 2011-04-04 ENCOUNTER — Encounter (HOSPITAL_COMMUNITY): Payer: Self-pay | Attending: Cardiology

## 2011-04-04 DIAGNOSIS — Z7902 Long term (current) use of antithrombotics/antiplatelets: Secondary | ICD-10-CM | POA: Insufficient documentation

## 2011-04-04 DIAGNOSIS — E785 Hyperlipidemia, unspecified: Secondary | ICD-10-CM | POA: Insufficient documentation

## 2011-04-04 DIAGNOSIS — Z8673 Personal history of transient ischemic attack (TIA), and cerebral infarction without residual deficits: Secondary | ICD-10-CM | POA: Insufficient documentation

## 2011-04-04 DIAGNOSIS — F17200 Nicotine dependence, unspecified, uncomplicated: Secondary | ICD-10-CM | POA: Insufficient documentation

## 2011-04-04 DIAGNOSIS — Z9861 Coronary angioplasty status: Secondary | ICD-10-CM | POA: Insufficient documentation

## 2011-04-04 DIAGNOSIS — J4489 Other specified chronic obstructive pulmonary disease: Secondary | ICD-10-CM | POA: Insufficient documentation

## 2011-04-04 DIAGNOSIS — J449 Chronic obstructive pulmonary disease, unspecified: Secondary | ICD-10-CM | POA: Insufficient documentation

## 2011-04-04 DIAGNOSIS — K219 Gastro-esophageal reflux disease without esophagitis: Secondary | ICD-10-CM | POA: Insufficient documentation

## 2011-04-04 DIAGNOSIS — I2510 Atherosclerotic heart disease of native coronary artery without angina pectoris: Secondary | ICD-10-CM | POA: Insufficient documentation

## 2011-04-04 DIAGNOSIS — I10 Essential (primary) hypertension: Secondary | ICD-10-CM | POA: Insufficient documentation

## 2011-04-04 DIAGNOSIS — Z5189 Encounter for other specified aftercare: Secondary | ICD-10-CM | POA: Insufficient documentation

## 2011-04-04 DIAGNOSIS — Z7982 Long term (current) use of aspirin: Secondary | ICD-10-CM | POA: Insufficient documentation

## 2011-04-05 ENCOUNTER — Encounter (HOSPITAL_COMMUNITY): Payer: Self-pay

## 2011-04-08 ENCOUNTER — Encounter (HOSPITAL_COMMUNITY): Payer: Self-pay

## 2011-04-09 ENCOUNTER — Encounter (HOSPITAL_COMMUNITY): Payer: Self-pay

## 2011-04-09 ENCOUNTER — Encounter (HOSPITAL_COMMUNITY): Payer: BC Managed Care – PPO

## 2011-04-10 ENCOUNTER — Encounter (HOSPITAL_COMMUNITY): Payer: Self-pay

## 2011-04-11 ENCOUNTER — Encounter (HOSPITAL_COMMUNITY): Payer: Self-pay

## 2011-04-11 ENCOUNTER — Encounter (HOSPITAL_COMMUNITY): Payer: BC Managed Care – PPO

## 2011-04-12 ENCOUNTER — Encounter (HOSPITAL_COMMUNITY): Payer: Self-pay

## 2011-04-15 ENCOUNTER — Encounter (HOSPITAL_COMMUNITY): Payer: Self-pay

## 2011-04-16 ENCOUNTER — Encounter (HOSPITAL_COMMUNITY): Payer: Self-pay

## 2011-04-16 ENCOUNTER — Encounter (HOSPITAL_COMMUNITY): Payer: BC Managed Care – PPO

## 2011-04-17 ENCOUNTER — Encounter (HOSPITAL_COMMUNITY): Payer: Self-pay

## 2011-04-18 ENCOUNTER — Encounter (HOSPITAL_COMMUNITY): Payer: BC Managed Care – PPO

## 2011-04-18 ENCOUNTER — Encounter (HOSPITAL_COMMUNITY): Payer: Self-pay

## 2011-04-19 ENCOUNTER — Encounter (HOSPITAL_COMMUNITY): Payer: Self-pay

## 2011-04-22 ENCOUNTER — Encounter (HOSPITAL_COMMUNITY): Payer: Self-pay

## 2011-04-23 ENCOUNTER — Encounter (HOSPITAL_COMMUNITY): Payer: Self-pay

## 2011-04-23 ENCOUNTER — Encounter (HOSPITAL_COMMUNITY): Payer: BC Managed Care – PPO

## 2011-04-24 ENCOUNTER — Encounter (HOSPITAL_COMMUNITY): Payer: Self-pay

## 2011-04-25 ENCOUNTER — Encounter (HOSPITAL_COMMUNITY): Payer: Self-pay

## 2011-04-25 ENCOUNTER — Encounter (HOSPITAL_COMMUNITY): Payer: BC Managed Care – PPO

## 2011-04-26 ENCOUNTER — Encounter (HOSPITAL_COMMUNITY): Payer: Self-pay

## 2011-04-29 ENCOUNTER — Encounter (HOSPITAL_COMMUNITY): Payer: Self-pay

## 2011-04-30 ENCOUNTER — Encounter (HOSPITAL_COMMUNITY): Payer: Self-pay

## 2011-04-30 ENCOUNTER — Encounter (HOSPITAL_COMMUNITY): Payer: BC Managed Care – PPO

## 2011-05-01 ENCOUNTER — Encounter (HOSPITAL_COMMUNITY): Payer: Self-pay

## 2011-05-02 ENCOUNTER — Encounter (HOSPITAL_COMMUNITY): Payer: Self-pay

## 2011-05-02 ENCOUNTER — Encounter (HOSPITAL_COMMUNITY): Payer: BC Managed Care – PPO

## 2011-05-03 ENCOUNTER — Encounter (HOSPITAL_COMMUNITY): Payer: Self-pay

## 2011-05-06 ENCOUNTER — Encounter (HOSPITAL_COMMUNITY): Payer: Self-pay

## 2011-05-06 ENCOUNTER — Encounter (HOSPITAL_COMMUNITY): Payer: Self-pay | Attending: Cardiology

## 2011-05-06 DIAGNOSIS — Z9861 Coronary angioplasty status: Secondary | ICD-10-CM | POA: Insufficient documentation

## 2011-05-06 DIAGNOSIS — K219 Gastro-esophageal reflux disease without esophagitis: Secondary | ICD-10-CM | POA: Insufficient documentation

## 2011-05-06 DIAGNOSIS — Z8673 Personal history of transient ischemic attack (TIA), and cerebral infarction without residual deficits: Secondary | ICD-10-CM | POA: Insufficient documentation

## 2011-05-06 DIAGNOSIS — J449 Chronic obstructive pulmonary disease, unspecified: Secondary | ICD-10-CM | POA: Insufficient documentation

## 2011-05-06 DIAGNOSIS — Z7902 Long term (current) use of antithrombotics/antiplatelets: Secondary | ICD-10-CM | POA: Insufficient documentation

## 2011-05-06 DIAGNOSIS — Z5189 Encounter for other specified aftercare: Secondary | ICD-10-CM | POA: Insufficient documentation

## 2011-05-06 DIAGNOSIS — J4489 Other specified chronic obstructive pulmonary disease: Secondary | ICD-10-CM | POA: Insufficient documentation

## 2011-05-06 DIAGNOSIS — I10 Essential (primary) hypertension: Secondary | ICD-10-CM | POA: Insufficient documentation

## 2011-05-06 DIAGNOSIS — I2510 Atherosclerotic heart disease of native coronary artery without angina pectoris: Secondary | ICD-10-CM | POA: Insufficient documentation

## 2011-05-06 DIAGNOSIS — F17200 Nicotine dependence, unspecified, uncomplicated: Secondary | ICD-10-CM | POA: Insufficient documentation

## 2011-05-06 DIAGNOSIS — E785 Hyperlipidemia, unspecified: Secondary | ICD-10-CM | POA: Insufficient documentation

## 2011-05-06 DIAGNOSIS — Z7982 Long term (current) use of aspirin: Secondary | ICD-10-CM | POA: Insufficient documentation

## 2011-05-07 ENCOUNTER — Encounter (HOSPITAL_COMMUNITY): Payer: BC Managed Care – PPO

## 2011-05-07 ENCOUNTER — Encounter (HOSPITAL_COMMUNITY): Payer: Self-pay

## 2011-05-08 ENCOUNTER — Encounter: Payer: Self-pay | Admitting: Pulmonary Disease

## 2011-05-08 ENCOUNTER — Encounter (HOSPITAL_COMMUNITY): Payer: Self-pay

## 2011-05-09 ENCOUNTER — Encounter (HOSPITAL_COMMUNITY): Payer: Self-pay

## 2011-05-09 ENCOUNTER — Encounter: Payer: Self-pay | Admitting: Pulmonary Disease

## 2011-05-09 ENCOUNTER — Encounter (HOSPITAL_COMMUNITY): Payer: BC Managed Care – PPO

## 2011-05-09 ENCOUNTER — Ambulatory Visit (INDEPENDENT_AMBULATORY_CARE_PROVIDER_SITE_OTHER): Payer: BC Managed Care – PPO | Admitting: Pulmonary Disease

## 2011-05-09 VITALS — BP 94/58 | HR 83 | Temp 97.7°F | Ht 66.0 in | Wt 160.0 lb

## 2011-05-09 DIAGNOSIS — Z23 Encounter for immunization: Secondary | ICD-10-CM

## 2011-05-09 DIAGNOSIS — J449 Chronic obstructive pulmonary disease, unspecified: Secondary | ICD-10-CM

## 2011-05-09 NOTE — Assessment & Plan Note (Signed)
The patient is at a stable baseline currently, but is not using symbicort compliantly and is still smoking.  I have asked him to get back on symbicort twice a [place] religiously, and also to get back on his chantix.  He understands that smoking cessation is the most aspect of his treatment regimen.  We'll give the patient a flu shot today as well

## 2011-05-09 NOTE — Progress Notes (Signed)
  Subjective:    Patient ID: Brad Turner, male    DOB: May 17, 1941, 70 y.o.   MRN: [id number]  HPI The patient comes in today for followup of his known moderate COPD.  He has been maintaining her own symbicort, but not compliantly.  Unfortunately he is continuing to smoke, and did not finish his Chantix starter pack because of vacation.  He is going to get back on this.  He continues to have some mild cough with nonpurulent mucus production, and I have explained this is related to airway inflammation from his smoking.  His dyspnea on exertion is at his usual baseline.   Review of Systems  Constitutional: Negative for fever and unexpected weight change.  HENT: Positive for ear pain, congestion, rhinorrhea and dental problem. Negative for nosebleeds, sore throat, sneezing, trouble swallowing, postnasal drip and sinus pressure.   Eyes: Negative for redness and itching.  Respiratory: Positive for cough, chest tightness, shortness of breath and wheezing.   Cardiovascular: Negative for palpitations and leg swelling.  Gastrointestinal: Negative for nausea and vomiting.  Genitourinary: Negative for dysuria.  Musculoskeletal: Positive for joint swelling.  Skin: Negative for rash.  Neurological: Negative for headaches.  Hematological: Bruises/bleeds easily.  Psychiatric/Behavioral: Negative for dysphoric mood. The patient is not nervous/anxious.        Objective:   Physical Exam Well developed male in no acute distress Nose without purulence or discharge Chest with decreased breath sounds, no wheezes or rhonchi Cardiac exam with regular rate and rhythm Lower extremities without edema, no cyanosis noted Alert and oriented, moves all 4 extremities.       Assessment & Plan:

## 2011-05-09 NOTE — Patient Instructions (Signed)
Continue on symbicort Get back on chantix since smoking cessation is the key to you improving Will give you a flu shot today. followup with me in 6mos

## 2011-05-09 NOTE — Progress Notes (Signed)
Addended by: Salli Quarry on: 05/09/2011 10:38 AM   Modules accepted: Orders

## 2011-05-10 ENCOUNTER — Encounter (HOSPITAL_COMMUNITY): Payer: Self-pay

## 2011-05-13 ENCOUNTER — Encounter (HOSPITAL_COMMUNITY): Payer: Self-pay

## 2011-05-14 ENCOUNTER — Encounter (HOSPITAL_COMMUNITY): Payer: Self-pay

## 2011-05-14 ENCOUNTER — Encounter (HOSPITAL_COMMUNITY): Payer: BC Managed Care – PPO

## 2011-05-15 ENCOUNTER — Encounter (HOSPITAL_COMMUNITY): Payer: Self-pay

## 2011-05-16 ENCOUNTER — Encounter (HOSPITAL_COMMUNITY): Payer: Self-pay

## 2011-05-16 ENCOUNTER — Encounter (HOSPITAL_COMMUNITY): Payer: BC Managed Care – PPO

## 2011-05-17 ENCOUNTER — Encounter (HOSPITAL_COMMUNITY): Payer: Self-pay

## 2011-05-20 ENCOUNTER — Encounter (HOSPITAL_COMMUNITY): Payer: Self-pay

## 2011-05-21 ENCOUNTER — Encounter (HOSPITAL_COMMUNITY): Payer: BC Managed Care – PPO

## 2011-05-21 ENCOUNTER — Encounter (HOSPITAL_COMMUNITY): Payer: Self-pay

## 2011-05-22 ENCOUNTER — Encounter (HOSPITAL_COMMUNITY): Payer: Self-pay

## 2011-05-23 ENCOUNTER — Encounter (HOSPITAL_COMMUNITY): Payer: Self-pay

## 2011-05-23 ENCOUNTER — Encounter (HOSPITAL_COMMUNITY): Payer: BC Managed Care – PPO

## 2011-05-24 ENCOUNTER — Encounter (HOSPITAL_COMMUNITY): Payer: Self-pay

## 2011-05-27 ENCOUNTER — Encounter (HOSPITAL_COMMUNITY): Payer: Self-pay

## 2011-05-28 ENCOUNTER — Encounter (HOSPITAL_COMMUNITY): Payer: Self-pay

## 2011-05-28 ENCOUNTER — Encounter (HOSPITAL_COMMUNITY): Payer: BC Managed Care – PPO

## 2011-05-29 ENCOUNTER — Encounter (HOSPITAL_COMMUNITY): Payer: Self-pay

## 2011-05-30 ENCOUNTER — Encounter (HOSPITAL_COMMUNITY): Payer: Self-pay

## 2011-05-30 ENCOUNTER — Encounter (HOSPITAL_COMMUNITY): Payer: BC Managed Care – PPO

## 2011-05-31 ENCOUNTER — Encounter (HOSPITAL_COMMUNITY): Payer: Self-pay

## 2011-06-03 ENCOUNTER — Encounter (HOSPITAL_COMMUNITY): Payer: Self-pay | Attending: Cardiology

## 2011-06-03 ENCOUNTER — Encounter (HOSPITAL_COMMUNITY): Payer: Self-pay

## 2011-06-03 DIAGNOSIS — E785 Hyperlipidemia, unspecified: Secondary | ICD-10-CM | POA: Insufficient documentation

## 2011-06-03 DIAGNOSIS — Z9861 Coronary angioplasty status: Secondary | ICD-10-CM | POA: Insufficient documentation

## 2011-06-03 DIAGNOSIS — I2510 Atherosclerotic heart disease of native coronary artery without angina pectoris: Secondary | ICD-10-CM | POA: Insufficient documentation

## 2011-06-03 DIAGNOSIS — J449 Chronic obstructive pulmonary disease, unspecified: Secondary | ICD-10-CM | POA: Insufficient documentation

## 2011-06-03 DIAGNOSIS — Z7982 Long term (current) use of aspirin: Secondary | ICD-10-CM | POA: Insufficient documentation

## 2011-06-03 DIAGNOSIS — Z5189 Encounter for other specified aftercare: Secondary | ICD-10-CM | POA: Insufficient documentation

## 2011-06-03 DIAGNOSIS — Z7902 Long term (current) use of antithrombotics/antiplatelets: Secondary | ICD-10-CM | POA: Insufficient documentation

## 2011-06-03 DIAGNOSIS — I10 Essential (primary) hypertension: Secondary | ICD-10-CM | POA: Insufficient documentation

## 2011-06-03 DIAGNOSIS — Z8673 Personal history of transient ischemic attack (TIA), and cerebral infarction without residual deficits: Secondary | ICD-10-CM | POA: Insufficient documentation

## 2011-06-03 DIAGNOSIS — J4489 Other specified chronic obstructive pulmonary disease: Secondary | ICD-10-CM | POA: Insufficient documentation

## 2011-06-03 DIAGNOSIS — K219 Gastro-esophageal reflux disease without esophagitis: Secondary | ICD-10-CM | POA: Insufficient documentation

## 2011-06-03 DIAGNOSIS — F17200 Nicotine dependence, unspecified, uncomplicated: Secondary | ICD-10-CM | POA: Insufficient documentation

## 2011-06-04 ENCOUNTER — Encounter (HOSPITAL_COMMUNITY): Payer: BC Managed Care – PPO

## 2011-06-04 ENCOUNTER — Encounter (HOSPITAL_COMMUNITY): Payer: Self-pay

## 2011-06-05 ENCOUNTER — Encounter (HOSPITAL_COMMUNITY): Payer: Self-pay

## 2011-06-06 ENCOUNTER — Encounter (HOSPITAL_COMMUNITY): Payer: BC Managed Care – PPO

## 2011-06-06 ENCOUNTER — Encounter (HOSPITAL_COMMUNITY): Payer: Self-pay

## 2011-06-07 ENCOUNTER — Encounter (HOSPITAL_COMMUNITY): Payer: Self-pay

## 2011-06-10 ENCOUNTER — Encounter (HOSPITAL_COMMUNITY): Payer: Self-pay

## 2011-06-11 ENCOUNTER — Encounter (HOSPITAL_COMMUNITY): Payer: Self-pay

## 2011-06-11 ENCOUNTER — Encounter (HOSPITAL_COMMUNITY): Payer: BC Managed Care – PPO

## 2011-06-12 ENCOUNTER — Encounter (HOSPITAL_COMMUNITY): Payer: Self-pay

## 2011-06-13 ENCOUNTER — Encounter (HOSPITAL_COMMUNITY): Payer: Self-pay

## 2011-06-13 ENCOUNTER — Encounter (HOSPITAL_COMMUNITY): Payer: BC Managed Care – PPO

## 2011-06-14 ENCOUNTER — Encounter (HOSPITAL_COMMUNITY): Payer: Self-pay

## 2011-06-17 ENCOUNTER — Encounter (HOSPITAL_COMMUNITY): Payer: Self-pay

## 2011-06-18 ENCOUNTER — Encounter (HOSPITAL_COMMUNITY): Payer: Self-pay

## 2011-06-18 ENCOUNTER — Encounter (HOSPITAL_COMMUNITY): Payer: BC Managed Care – PPO

## 2011-06-19 ENCOUNTER — Encounter (HOSPITAL_COMMUNITY): Payer: Self-pay

## 2011-06-20 ENCOUNTER — Encounter (HOSPITAL_COMMUNITY): Payer: Self-pay

## 2011-06-20 ENCOUNTER — Encounter (HOSPITAL_COMMUNITY): Payer: BC Managed Care – PPO

## 2011-06-21 ENCOUNTER — Encounter (HOSPITAL_COMMUNITY): Payer: Self-pay

## 2011-06-24 ENCOUNTER — Encounter (HOSPITAL_COMMUNITY): Payer: Self-pay

## 2011-06-25 ENCOUNTER — Encounter (HOSPITAL_COMMUNITY): Payer: Self-pay

## 2011-06-25 ENCOUNTER — Encounter (HOSPITAL_COMMUNITY): Payer: BC Managed Care – PPO

## 2011-06-26 ENCOUNTER — Encounter (HOSPITAL_COMMUNITY): Payer: Self-pay

## 2011-06-27 ENCOUNTER — Encounter (HOSPITAL_COMMUNITY): Payer: Self-pay

## 2011-06-27 ENCOUNTER — Encounter (HOSPITAL_COMMUNITY): Payer: BC Managed Care – PPO

## 2011-06-28 ENCOUNTER — Encounter (HOSPITAL_COMMUNITY): Payer: Self-pay

## 2011-07-01 ENCOUNTER — Encounter (HOSPITAL_COMMUNITY): Payer: Self-pay

## 2011-07-02 ENCOUNTER — Encounter (HOSPITAL_COMMUNITY): Payer: BC Managed Care – PPO

## 2011-07-02 ENCOUNTER — Encounter (HOSPITAL_COMMUNITY): Payer: Self-pay

## 2011-07-03 ENCOUNTER — Encounter (HOSPITAL_COMMUNITY): Payer: Self-pay

## 2011-07-04 ENCOUNTER — Encounter (HOSPITAL_COMMUNITY): Payer: Self-pay

## 2011-07-04 ENCOUNTER — Encounter (HOSPITAL_COMMUNITY): Payer: BC Managed Care – PPO

## 2011-07-04 DIAGNOSIS — E785 Hyperlipidemia, unspecified: Secondary | ICD-10-CM | POA: Insufficient documentation

## 2011-07-04 DIAGNOSIS — Z8673 Personal history of transient ischemic attack (TIA), and cerebral infarction without residual deficits: Secondary | ICD-10-CM | POA: Insufficient documentation

## 2011-07-04 DIAGNOSIS — Z5189 Encounter for other specified aftercare: Secondary | ICD-10-CM | POA: Insufficient documentation

## 2011-07-04 DIAGNOSIS — J4489 Other specified chronic obstructive pulmonary disease: Secondary | ICD-10-CM | POA: Insufficient documentation

## 2011-07-04 DIAGNOSIS — J449 Chronic obstructive pulmonary disease, unspecified: Secondary | ICD-10-CM | POA: Insufficient documentation

## 2011-07-04 DIAGNOSIS — Z9861 Coronary angioplasty status: Secondary | ICD-10-CM | POA: Insufficient documentation

## 2011-07-04 DIAGNOSIS — Z7902 Long term (current) use of antithrombotics/antiplatelets: Secondary | ICD-10-CM | POA: Insufficient documentation

## 2011-07-04 DIAGNOSIS — I2510 Atherosclerotic heart disease of native coronary artery without angina pectoris: Secondary | ICD-10-CM | POA: Insufficient documentation

## 2011-07-04 DIAGNOSIS — K219 Gastro-esophageal reflux disease without esophagitis: Secondary | ICD-10-CM | POA: Insufficient documentation

## 2011-07-04 DIAGNOSIS — Z7982 Long term (current) use of aspirin: Secondary | ICD-10-CM | POA: Insufficient documentation

## 2011-07-04 DIAGNOSIS — I10 Essential (primary) hypertension: Secondary | ICD-10-CM | POA: Insufficient documentation

## 2011-07-04 DIAGNOSIS — F17200 Nicotine dependence, unspecified, uncomplicated: Secondary | ICD-10-CM | POA: Insufficient documentation

## 2011-07-05 ENCOUNTER — Encounter (HOSPITAL_COMMUNITY): Payer: Self-pay

## 2011-07-08 ENCOUNTER — Encounter (HOSPITAL_COMMUNITY): Payer: Self-pay

## 2011-07-09 ENCOUNTER — Encounter (HOSPITAL_COMMUNITY): Payer: Self-pay

## 2011-07-09 ENCOUNTER — Encounter (HOSPITAL_COMMUNITY): Payer: BC Managed Care – PPO

## 2011-07-10 ENCOUNTER — Encounter (HOSPITAL_COMMUNITY): Payer: Self-pay

## 2011-07-11 ENCOUNTER — Encounter (HOSPITAL_COMMUNITY): Payer: BC Managed Care – PPO

## 2011-07-11 ENCOUNTER — Encounter (HOSPITAL_COMMUNITY): Payer: Self-pay

## 2011-07-12 ENCOUNTER — Encounter (HOSPITAL_COMMUNITY): Payer: Self-pay

## 2011-07-15 ENCOUNTER — Encounter (HOSPITAL_COMMUNITY): Payer: Self-pay

## 2011-07-16 ENCOUNTER — Encounter (HOSPITAL_COMMUNITY): Payer: Self-pay

## 2011-07-16 ENCOUNTER — Encounter (HOSPITAL_COMMUNITY): Payer: BC Managed Care – PPO

## 2011-07-17 ENCOUNTER — Encounter (HOSPITAL_COMMUNITY): Payer: Self-pay

## 2011-07-18 ENCOUNTER — Encounter (HOSPITAL_COMMUNITY): Payer: Self-pay

## 2011-07-18 ENCOUNTER — Encounter (HOSPITAL_COMMUNITY): Payer: BC Managed Care – PPO

## 2011-07-19 ENCOUNTER — Encounter (HOSPITAL_COMMUNITY): Payer: Self-pay

## 2011-07-22 ENCOUNTER — Encounter (HOSPITAL_COMMUNITY)
Admission: RE | Admit: 2011-07-22 | Discharge: 2011-07-22 | Disposition: A | Discharge status: Home / Self Care | Payer: Self-pay | Source: Ambulatory Visit | Attending: Cardiology | Admitting: Cardiology

## 2011-07-22 ENCOUNTER — Encounter (HOSPITAL_COMMUNITY): Payer: Self-pay

## 2011-07-23 ENCOUNTER — Encounter (HOSPITAL_COMMUNITY)
Admission: RE | Admit: 2011-07-23 | Discharge: 2011-07-23 | Disposition: A | Discharge status: Home / Self Care | Payer: Self-pay | Source: Ambulatory Visit | Attending: Cardiology | Admitting: Cardiology

## 2011-07-23 ENCOUNTER — Encounter (HOSPITAL_COMMUNITY): Payer: BC Managed Care – PPO

## 2011-07-24 ENCOUNTER — Encounter (HOSPITAL_COMMUNITY): Payer: Self-pay

## 2011-07-25 ENCOUNTER — Encounter (HOSPITAL_COMMUNITY): Payer: Self-pay

## 2011-07-25 ENCOUNTER — Encounter (HOSPITAL_COMMUNITY): Payer: BC Managed Care – PPO

## 2011-07-26 ENCOUNTER — Encounter (HOSPITAL_COMMUNITY): Payer: Self-pay

## 2011-07-29 ENCOUNTER — Encounter (HOSPITAL_COMMUNITY)
Admission: RE | Admit: 2011-07-29 | Discharge: 2011-07-29 | Disposition: A | Discharge status: Home / Self Care | Payer: Self-pay | Source: Ambulatory Visit | Attending: Cardiology | Admitting: Cardiology

## 2011-07-29 ENCOUNTER — Encounter (HOSPITAL_COMMUNITY): Payer: Self-pay

## 2011-07-30 ENCOUNTER — Encounter (HOSPITAL_COMMUNITY)
Admission: RE | Admit: 2011-07-30 | Discharge: 2011-07-30 | Disposition: A | Discharge status: Home / Self Care | Payer: Self-pay | Source: Ambulatory Visit | Attending: Cardiology | Admitting: Cardiology

## 2011-07-30 ENCOUNTER — Encounter (HOSPITAL_COMMUNITY): Payer: BC Managed Care – PPO

## 2011-07-31 ENCOUNTER — Encounter (HOSPITAL_COMMUNITY): Payer: Self-pay

## 2011-08-01 ENCOUNTER — Encounter (HOSPITAL_COMMUNITY)
Admission: RE | Admit: 2011-08-01 | Discharge: 2011-08-01 | Disposition: A | Discharge status: Home / Self Care | Payer: Self-pay | Source: Ambulatory Visit | Attending: Cardiology | Admitting: Cardiology

## 2011-08-01 ENCOUNTER — Encounter (HOSPITAL_COMMUNITY): Payer: BC Managed Care – PPO

## 2011-08-02 ENCOUNTER — Encounter (HOSPITAL_COMMUNITY): Payer: Self-pay

## 2011-08-05 ENCOUNTER — Encounter (HOSPITAL_COMMUNITY)
Admission: RE | Admit: 2011-08-05 | Discharge: 2011-08-05 | Disposition: A | Discharge status: Home / Self Care | Payer: Self-pay | Source: Ambulatory Visit | Attending: Cardiology | Admitting: Cardiology

## 2011-08-05 ENCOUNTER — Encounter (HOSPITAL_COMMUNITY): Payer: Self-pay

## 2011-08-05 DIAGNOSIS — Z5189 Encounter for other specified aftercare: Secondary | ICD-10-CM | POA: Insufficient documentation

## 2011-08-05 DIAGNOSIS — Z7902 Long term (current) use of antithrombotics/antiplatelets: Secondary | ICD-10-CM | POA: Insufficient documentation

## 2011-08-05 DIAGNOSIS — Z8673 Personal history of transient ischemic attack (TIA), and cerebral infarction without residual deficits: Secondary | ICD-10-CM | POA: Insufficient documentation

## 2011-08-05 DIAGNOSIS — J449 Chronic obstructive pulmonary disease, unspecified: Secondary | ICD-10-CM | POA: Insufficient documentation

## 2011-08-05 DIAGNOSIS — F17200 Nicotine dependence, unspecified, uncomplicated: Secondary | ICD-10-CM | POA: Insufficient documentation

## 2011-08-05 DIAGNOSIS — K219 Gastro-esophageal reflux disease without esophagitis: Secondary | ICD-10-CM | POA: Insufficient documentation

## 2011-08-05 DIAGNOSIS — E785 Hyperlipidemia, unspecified: Secondary | ICD-10-CM | POA: Insufficient documentation

## 2011-08-05 DIAGNOSIS — J4489 Other specified chronic obstructive pulmonary disease: Secondary | ICD-10-CM | POA: Insufficient documentation

## 2011-08-05 DIAGNOSIS — I2510 Atherosclerotic heart disease of native coronary artery without angina pectoris: Secondary | ICD-10-CM | POA: Insufficient documentation

## 2011-08-05 DIAGNOSIS — I10 Essential (primary) hypertension: Secondary | ICD-10-CM | POA: Insufficient documentation

## 2011-08-05 DIAGNOSIS — Z9861 Coronary angioplasty status: Secondary | ICD-10-CM | POA: Insufficient documentation

## 2011-08-05 DIAGNOSIS — Z7982 Long term (current) use of aspirin: Secondary | ICD-10-CM | POA: Insufficient documentation

## 2011-08-06 ENCOUNTER — Encounter (HOSPITAL_COMMUNITY)
Admission: RE | Admit: 2011-08-06 | Discharge: 2011-08-06 | Disposition: A | Discharge status: Home / Self Care | Payer: Self-pay | Source: Ambulatory Visit | Attending: Cardiology | Admitting: Cardiology

## 2011-08-06 ENCOUNTER — Encounter (HOSPITAL_COMMUNITY): Payer: BC Managed Care – PPO

## 2011-08-07 ENCOUNTER — Encounter (HOSPITAL_COMMUNITY): Payer: Self-pay

## 2011-08-08 ENCOUNTER — Encounter (HOSPITAL_COMMUNITY): Payer: Self-pay

## 2011-08-08 ENCOUNTER — Encounter (HOSPITAL_COMMUNITY): Payer: BC Managed Care – PPO

## 2011-08-09 ENCOUNTER — Encounter (HOSPITAL_COMMUNITY): Payer: Self-pay

## 2011-08-12 ENCOUNTER — Encounter (HOSPITAL_COMMUNITY): Payer: Self-pay

## 2011-08-12 ENCOUNTER — Encounter (HOSPITAL_COMMUNITY)
Admission: RE | Admit: 2011-08-12 | Discharge: 2011-08-12 | Disposition: A | Discharge status: Home / Self Care | Payer: Self-pay | Source: Ambulatory Visit | Attending: Cardiology | Admitting: Cardiology

## 2011-08-13 ENCOUNTER — Encounter (HOSPITAL_COMMUNITY): Payer: BC Managed Care – PPO

## 2011-08-13 ENCOUNTER — Encounter (HOSPITAL_COMMUNITY)
Admission: RE | Admit: 2011-08-13 | Discharge: 2011-08-13 | Disposition: A | Discharge status: Home / Self Care | Payer: Self-pay | Source: Ambulatory Visit | Attending: Cardiology | Admitting: Cardiology

## 2011-08-14 ENCOUNTER — Encounter (HOSPITAL_COMMUNITY): Payer: Self-pay

## 2011-08-15 ENCOUNTER — Encounter (HOSPITAL_COMMUNITY): Payer: BC Managed Care – PPO

## 2011-08-15 ENCOUNTER — Encounter (HOSPITAL_COMMUNITY)
Admission: RE | Admit: 2011-08-15 | Discharge: 2011-08-15 | Disposition: A | Discharge status: Home / Self Care | Payer: Self-pay | Source: Ambulatory Visit | Attending: Cardiology | Admitting: Cardiology

## 2011-08-16 ENCOUNTER — Encounter (HOSPITAL_COMMUNITY): Payer: Self-pay

## 2011-08-19 ENCOUNTER — Encounter (HOSPITAL_COMMUNITY): Payer: Self-pay

## 2011-08-19 ENCOUNTER — Encounter (HOSPITAL_COMMUNITY)
Admission: RE | Admit: 2011-08-19 | Discharge: 2011-08-19 | Disposition: A | Discharge status: Home / Self Care | Payer: Self-pay | Source: Ambulatory Visit | Attending: Cardiology | Admitting: Cardiology

## 2011-08-20 ENCOUNTER — Encounter (HOSPITAL_COMMUNITY)
Admission: RE | Admit: 2011-08-20 | Discharge: 2011-08-20 | Disposition: A | Discharge status: Home / Self Care | Payer: Self-pay | Source: Ambulatory Visit | Attending: Cardiology | Admitting: Cardiology

## 2011-08-20 ENCOUNTER — Encounter (HOSPITAL_COMMUNITY): Payer: BC Managed Care – PPO

## 2011-08-21 ENCOUNTER — Encounter (HOSPITAL_COMMUNITY): Payer: Self-pay

## 2011-08-22 ENCOUNTER — Encounter (HOSPITAL_COMMUNITY)
Admission: RE | Admit: 2011-08-22 | Discharge: 2011-08-22 | Disposition: A | Discharge status: Home / Self Care | Payer: Self-pay | Source: Ambulatory Visit | Attending: Cardiology | Admitting: Cardiology

## 2011-08-22 ENCOUNTER — Encounter (HOSPITAL_COMMUNITY): Payer: BC Managed Care – PPO

## 2011-08-23 ENCOUNTER — Encounter (HOSPITAL_COMMUNITY): Payer: Self-pay

## 2011-08-26 ENCOUNTER — Encounter (HOSPITAL_COMMUNITY): Payer: Self-pay

## 2011-08-27 ENCOUNTER — Encounter (HOSPITAL_COMMUNITY): Payer: Self-pay

## 2011-08-27 ENCOUNTER — Encounter (HOSPITAL_COMMUNITY): Payer: BC Managed Care – PPO

## 2011-08-28 ENCOUNTER — Encounter (HOSPITAL_COMMUNITY): Payer: Self-pay

## 2011-08-29 ENCOUNTER — Encounter (HOSPITAL_COMMUNITY): Payer: BC Managed Care – PPO

## 2011-08-29 ENCOUNTER — Encounter (HOSPITAL_COMMUNITY): Payer: Self-pay

## 2011-08-30 ENCOUNTER — Encounter (HOSPITAL_COMMUNITY): Payer: Self-pay

## 2011-09-02 ENCOUNTER — Encounter (HOSPITAL_COMMUNITY): Payer: Self-pay

## 2011-09-03 ENCOUNTER — Encounter (HOSPITAL_COMMUNITY): Payer: BC Managed Care – PPO

## 2011-09-05 ENCOUNTER — Encounter (HOSPITAL_COMMUNITY)
Admission: RE | Admit: 2011-09-05 | Discharge: 2011-09-05 | Disposition: A | Discharge status: Home / Self Care | Payer: Self-pay | Source: Ambulatory Visit | Attending: Cardiology | Admitting: Cardiology

## 2011-09-05 ENCOUNTER — Encounter (HOSPITAL_COMMUNITY): Payer: BC Managed Care – PPO

## 2011-09-05 DIAGNOSIS — Z5189 Encounter for other specified aftercare: Secondary | ICD-10-CM | POA: Insufficient documentation

## 2011-09-05 DIAGNOSIS — F17200 Nicotine dependence, unspecified, uncomplicated: Secondary | ICD-10-CM | POA: Insufficient documentation

## 2011-09-05 DIAGNOSIS — E785 Hyperlipidemia, unspecified: Secondary | ICD-10-CM | POA: Insufficient documentation

## 2011-09-05 DIAGNOSIS — I2510 Atherosclerotic heart disease of native coronary artery without angina pectoris: Secondary | ICD-10-CM | POA: Insufficient documentation

## 2011-09-05 DIAGNOSIS — Z7902 Long term (current) use of antithrombotics/antiplatelets: Secondary | ICD-10-CM | POA: Insufficient documentation

## 2011-09-05 DIAGNOSIS — Z7982 Long term (current) use of aspirin: Secondary | ICD-10-CM | POA: Insufficient documentation

## 2011-09-05 DIAGNOSIS — K219 Gastro-esophageal reflux disease without esophagitis: Secondary | ICD-10-CM | POA: Insufficient documentation

## 2011-09-05 DIAGNOSIS — Z9861 Coronary angioplasty status: Secondary | ICD-10-CM | POA: Insufficient documentation

## 2011-09-05 DIAGNOSIS — I10 Essential (primary) hypertension: Secondary | ICD-10-CM | POA: Insufficient documentation

## 2011-09-05 DIAGNOSIS — J4489 Other specified chronic obstructive pulmonary disease: Secondary | ICD-10-CM | POA: Insufficient documentation

## 2011-09-05 DIAGNOSIS — J449 Chronic obstructive pulmonary disease, unspecified: Secondary | ICD-10-CM | POA: Insufficient documentation

## 2011-09-05 DIAGNOSIS — Z8673 Personal history of transient ischemic attack (TIA), and cerebral infarction without residual deficits: Secondary | ICD-10-CM | POA: Insufficient documentation

## 2011-09-09 ENCOUNTER — Encounter (HOSPITAL_COMMUNITY)
Admission: RE | Admit: 2011-09-09 | Discharge: 2011-09-09 | Disposition: A | Discharge status: Home / Self Care | Payer: Self-pay | Source: Ambulatory Visit | Attending: Cardiology | Admitting: Cardiology

## 2011-09-10 ENCOUNTER — Encounter (HOSPITAL_COMMUNITY)
Admission: RE | Admit: 2011-09-10 | Discharge: 2011-09-10 | Disposition: A | Discharge status: Home / Self Care | Payer: Self-pay | Source: Ambulatory Visit | Attending: Cardiology | Admitting: Cardiology

## 2011-09-10 ENCOUNTER — Encounter (HOSPITAL_COMMUNITY): Payer: BC Managed Care – PPO

## 2011-09-12 ENCOUNTER — Encounter (HOSPITAL_COMMUNITY): Payer: BC Managed Care – PPO

## 2011-09-12 ENCOUNTER — Encounter (HOSPITAL_COMMUNITY)
Admission: RE | Admit: 2011-09-12 | Discharge: 2011-09-12 | Disposition: A | Discharge status: Home / Self Care | Payer: Self-pay | Source: Ambulatory Visit | Attending: Cardiology | Admitting: Cardiology

## 2011-09-16 ENCOUNTER — Encounter (HOSPITAL_COMMUNITY)
Admission: RE | Admit: 2011-09-16 | Discharge: 2011-09-16 | Disposition: A | Discharge status: Home / Self Care | Payer: Self-pay | Source: Ambulatory Visit | Attending: Cardiology | Admitting: Cardiology

## 2011-09-17 ENCOUNTER — Encounter (HOSPITAL_COMMUNITY)
Admission: RE | Admit: 2011-09-17 | Discharge: 2011-09-17 | Disposition: A | Discharge status: Home / Self Care | Payer: Self-pay | Source: Ambulatory Visit | Attending: Cardiology | Admitting: Cardiology

## 2011-09-19 ENCOUNTER — Encounter (HOSPITAL_COMMUNITY)
Admission: RE | Admit: 2011-09-19 | Discharge: 2011-09-19 | Disposition: A | Discharge status: Home / Self Care | Payer: Self-pay | Source: Ambulatory Visit | Attending: Cardiology | Admitting: Cardiology

## 2011-09-23 ENCOUNTER — Encounter (HOSPITAL_COMMUNITY)
Admission: RE | Admit: 2011-09-23 | Discharge: 2011-09-23 | Disposition: A | Discharge status: Home / Self Care | Payer: Self-pay | Source: Ambulatory Visit | Attending: Cardiology | Admitting: Cardiology

## 2011-09-24 ENCOUNTER — Encounter (HOSPITAL_COMMUNITY)
Admission: RE | Admit: 2011-09-24 | Discharge: 2011-09-24 | Disposition: A | Discharge status: Home / Self Care | Payer: Self-pay | Source: Ambulatory Visit | Attending: Cardiology | Admitting: Cardiology

## 2011-09-26 ENCOUNTER — Encounter (HOSPITAL_COMMUNITY)
Admission: RE | Admit: 2011-09-26 | Discharge: 2011-09-26 | Disposition: A | Discharge status: Home / Self Care | Payer: Self-pay | Source: Ambulatory Visit | Attending: Cardiology | Admitting: Cardiology

## 2011-09-30 ENCOUNTER — Encounter (HOSPITAL_COMMUNITY)
Admission: RE | Admit: 2011-09-30 | Discharge: 2011-09-30 | Disposition: A | Discharge status: Home / Self Care | Payer: Self-pay | Source: Ambulatory Visit | Attending: Cardiology | Admitting: Cardiology

## 2011-10-01 ENCOUNTER — Encounter (HOSPITAL_COMMUNITY)
Admission: RE | Admit: 2011-10-01 | Discharge: 2011-10-01 | Disposition: A | Discharge status: Home / Self Care | Payer: Self-pay | Source: Ambulatory Visit | Attending: Cardiology | Admitting: Cardiology

## 2011-10-03 ENCOUNTER — Encounter (HOSPITAL_COMMUNITY)
Admission: RE | Admit: 2011-10-03 | Discharge: 2011-10-03 | Disposition: A | Discharge status: Home / Self Care | Payer: Self-pay | Source: Ambulatory Visit | Attending: Cardiology | Admitting: Cardiology

## 2011-10-07 ENCOUNTER — Encounter (HOSPITAL_COMMUNITY)
Admission: RE | Admit: 2011-10-07 | Discharge: 2011-10-07 | Disposition: A | Discharge status: Home / Self Care | Payer: Self-pay | Source: Ambulatory Visit | Attending: Cardiology | Admitting: Cardiology

## 2011-10-07 DIAGNOSIS — Z5189 Encounter for other specified aftercare: Secondary | ICD-10-CM | POA: Insufficient documentation

## 2011-10-07 DIAGNOSIS — J4489 Other specified chronic obstructive pulmonary disease: Secondary | ICD-10-CM | POA: Insufficient documentation

## 2011-10-07 DIAGNOSIS — Z7982 Long term (current) use of aspirin: Secondary | ICD-10-CM | POA: Insufficient documentation

## 2011-10-07 DIAGNOSIS — I2510 Atherosclerotic heart disease of native coronary artery without angina pectoris: Secondary | ICD-10-CM | POA: Insufficient documentation

## 2011-10-07 DIAGNOSIS — F17200 Nicotine dependence, unspecified, uncomplicated: Secondary | ICD-10-CM | POA: Insufficient documentation

## 2011-10-07 DIAGNOSIS — Z7902 Long term (current) use of antithrombotics/antiplatelets: Secondary | ICD-10-CM | POA: Insufficient documentation

## 2011-10-07 DIAGNOSIS — Z8673 Personal history of transient ischemic attack (TIA), and cerebral infarction without residual deficits: Secondary | ICD-10-CM | POA: Insufficient documentation

## 2011-10-07 DIAGNOSIS — J449 Chronic obstructive pulmonary disease, unspecified: Secondary | ICD-10-CM | POA: Insufficient documentation

## 2011-10-07 DIAGNOSIS — K219 Gastro-esophageal reflux disease without esophagitis: Secondary | ICD-10-CM | POA: Insufficient documentation

## 2011-10-07 DIAGNOSIS — Z9861 Coronary angioplasty status: Secondary | ICD-10-CM | POA: Insufficient documentation

## 2011-10-07 DIAGNOSIS — E785 Hyperlipidemia, unspecified: Secondary | ICD-10-CM | POA: Insufficient documentation

## 2011-10-07 DIAGNOSIS — I10 Essential (primary) hypertension: Secondary | ICD-10-CM | POA: Insufficient documentation

## 2011-10-08 ENCOUNTER — Encounter (HOSPITAL_COMMUNITY)
Admission: RE | Admit: 2011-10-08 | Discharge: 2011-10-08 | Disposition: A | Discharge status: Home / Self Care | Payer: Self-pay | Source: Ambulatory Visit | Attending: Cardiology | Admitting: Cardiology

## 2011-10-10 ENCOUNTER — Encounter (HOSPITAL_COMMUNITY)
Admission: RE | Admit: 2011-10-10 | Discharge: 2011-10-10 | Disposition: A | Discharge status: Home / Self Care | Payer: Self-pay | Source: Ambulatory Visit | Attending: Cardiology | Admitting: Cardiology

## 2011-10-14 ENCOUNTER — Encounter (HOSPITAL_COMMUNITY)
Admission: RE | Admit: 2011-10-14 | Discharge: 2011-10-14 | Payer: Self-pay | Source: Ambulatory Visit | Attending: Cardiology | Admitting: Cardiology

## 2011-10-14 NOTE — Progress Notes (Signed)
[phone] Pt arrived to exercise today but at check-in he c/o dizziness, which he states he also experienced on Sunday, and tingling in right hand. Pt said he thought he was having a TIA. BP at that time was 128/75 HR 94 bpm. Pt was taken to the treatment room, after consultation with one of our nurse case managers.It was decided he not exercise today due to symptoms. Pt started Chantix this weekend but did smoke his usual cigarette this morning. Pt states he forgot to take all of his medications on Sunday except the Chantix. Pt had upset stomach/queasiness which may or may not have been related to taking Chantix. Symptoms resolved fairly quickly, Dr Deneen Harts office was contacted, and I relayed to [place] his nurse pt's sx's. After consulting with Dr Wylene Simmer, she informed me that it was fine to let the patient go home. At exit his BP was 114/56 HR 81 bpm SaO2 94% RA.

## 2011-10-15 ENCOUNTER — Encounter (HOSPITAL_COMMUNITY)
Admission: RE | Admit: 2011-10-15 | Discharge: 2011-10-15 | Disposition: A | Discharge status: Home / Self Care | Payer: Self-pay | Source: Ambulatory Visit | Attending: Cardiology | Admitting: Cardiology

## 2011-10-17 ENCOUNTER — Encounter (HOSPITAL_COMMUNITY)
Admission: RE | Admit: 2011-10-17 | Discharge: 2011-10-17 | Disposition: A | Discharge status: Home / Self Care | Payer: Self-pay | Source: Ambulatory Visit | Attending: Cardiology | Admitting: Cardiology

## 2011-10-21 ENCOUNTER — Encounter (HOSPITAL_COMMUNITY)
Admission: RE | Admit: 2011-10-21 | Discharge: 2011-10-21 | Disposition: A | Discharge status: Home / Self Care | Payer: Self-pay | Source: Ambulatory Visit | Attending: Cardiology | Admitting: Cardiology

## 2011-10-22 ENCOUNTER — Encounter (HOSPITAL_COMMUNITY)
Admission: RE | Admit: 2011-10-22 | Discharge: 2011-10-22 | Disposition: A | Discharge status: Home / Self Care | Payer: Self-pay | Source: Ambulatory Visit | Attending: Cardiology | Admitting: Cardiology

## 2011-10-24 ENCOUNTER — Encounter (HOSPITAL_COMMUNITY)
Admission: RE | Admit: 2011-10-24 | Discharge: 2011-10-24 | Disposition: A | Discharge status: Home / Self Care | Payer: Self-pay | Source: Ambulatory Visit | Attending: Cardiology | Admitting: Cardiology

## 2011-10-28 ENCOUNTER — Encounter (HOSPITAL_COMMUNITY): Payer: Self-pay

## 2011-10-29 ENCOUNTER — Encounter (HOSPITAL_COMMUNITY): Payer: Self-pay

## 2011-10-31 ENCOUNTER — Encounter (HOSPITAL_COMMUNITY): Payer: Self-pay

## 2011-11-04 ENCOUNTER — Encounter (HOSPITAL_COMMUNITY)
Admission: RE | Admit: 2011-11-04 | Discharge: 2011-11-04 | Disposition: A | Discharge status: Home / Self Care | Payer: Self-pay | Source: Ambulatory Visit | Attending: Cardiology | Admitting: Cardiology

## 2011-11-04 DIAGNOSIS — Z9861 Coronary angioplasty status: Secondary | ICD-10-CM | POA: Insufficient documentation

## 2011-11-04 DIAGNOSIS — F17200 Nicotine dependence, unspecified, uncomplicated: Secondary | ICD-10-CM | POA: Insufficient documentation

## 2011-11-04 DIAGNOSIS — J4489 Other specified chronic obstructive pulmonary disease: Secondary | ICD-10-CM | POA: Insufficient documentation

## 2011-11-04 DIAGNOSIS — Z7982 Long term (current) use of aspirin: Secondary | ICD-10-CM | POA: Insufficient documentation

## 2011-11-04 DIAGNOSIS — I10 Essential (primary) hypertension: Secondary | ICD-10-CM | POA: Insufficient documentation

## 2011-11-04 DIAGNOSIS — Z7902 Long term (current) use of antithrombotics/antiplatelets: Secondary | ICD-10-CM | POA: Insufficient documentation

## 2011-11-04 DIAGNOSIS — J449 Chronic obstructive pulmonary disease, unspecified: Secondary | ICD-10-CM | POA: Insufficient documentation

## 2011-11-04 DIAGNOSIS — E785 Hyperlipidemia, unspecified: Secondary | ICD-10-CM | POA: Insufficient documentation

## 2011-11-04 DIAGNOSIS — I2510 Atherosclerotic heart disease of native coronary artery without angina pectoris: Secondary | ICD-10-CM | POA: Insufficient documentation

## 2011-11-04 DIAGNOSIS — K219 Gastro-esophageal reflux disease without esophagitis: Secondary | ICD-10-CM | POA: Insufficient documentation

## 2011-11-04 DIAGNOSIS — Z8673 Personal history of transient ischemic attack (TIA), and cerebral infarction without residual deficits: Secondary | ICD-10-CM | POA: Insufficient documentation

## 2011-11-04 DIAGNOSIS — Z5189 Encounter for other specified aftercare: Secondary | ICD-10-CM | POA: Insufficient documentation

## 2011-11-05 ENCOUNTER — Encounter (HOSPITAL_COMMUNITY)
Admission: RE | Admit: 2011-11-05 | Discharge: 2011-11-05 | Disposition: A | Discharge status: Home / Self Care | Payer: Self-pay | Source: Ambulatory Visit | Attending: Cardiology | Admitting: Cardiology

## 2011-11-07 ENCOUNTER — Ambulatory Visit (INDEPENDENT_AMBULATORY_CARE_PROVIDER_SITE_OTHER): Payer: BC Managed Care – PPO | Admitting: Pulmonary Disease

## 2011-11-07 ENCOUNTER — Encounter: Payer: Self-pay | Admitting: Pulmonary Disease

## 2011-11-07 ENCOUNTER — Encounter (HOSPITAL_COMMUNITY)
Admission: RE | Admit: 2011-11-07 | Discharge: 2011-11-07 | Disposition: A | Discharge status: Home / Self Care | Payer: Self-pay | Source: Ambulatory Visit | Attending: Cardiology | Admitting: Cardiology

## 2011-11-07 VITALS — BP 126/80 | HR 78 | Temp 98.5°F | Ht 66.0 in | Wt 176.0 lb

## 2011-11-07 DIAGNOSIS — J449 Chronic obstructive pulmonary disease, unspecified: Secondary | ICD-10-CM

## 2011-11-07 NOTE — Assessment & Plan Note (Signed)
The patient is maintaining a stable baseline from a COPD standpoint.  Unfortunately he continues to smoke, and is not getting 2 doses of his symbicort daily.  I have asked him to work on both of these areas more consistently.

## 2011-11-07 NOTE — Patient Instructions (Signed)
Continue on symbicort twice a [place] religiously Stop smoking followup with me in 6mos, but call if you are not doing well.

## 2011-11-07 NOTE — Progress Notes (Signed)
  Subjective:    Patient ID: Brad Turner, male    DOB: December 30, 1940, 71 y.o.   MRN: [id number]  HPI The patient comes in today for followup of his known COPD.  He is taking his symbicort consistently one time a [place], but misses many of the second doses.  He is also continuing to smoke.  He feels that his exertional tolerance is at his usual baseline, and has not had a recent chest infection or acute exacerbation.  He denies any significant cough or purulence at this time.   Review of Systems  Constitutional: Negative for fever and unexpected weight change.  HENT: Positive for congestion, rhinorrhea, sneezing, dental problem, postnasal drip and sinus pressure. Negative for ear pain, nosebleeds, sore throat and trouble swallowing.   Eyes: Negative for redness and itching.  Respiratory: Positive for shortness of breath. Negative for cough, chest tightness and wheezing.   Cardiovascular: Negative for palpitations and leg swelling.  Gastrointestinal: Negative for nausea, vomiting and diarrhea.  Genitourinary: Negative for dysuria.  Musculoskeletal: Positive for arthralgias. Negative for joint swelling.  Skin: Positive for rash.  Neurological: Negative for headaches.  Hematological: Bruises/bleeds easily.  Psychiatric/Behavioral: Negative for dysphoric mood. The patient is not nervous/anxious.        Objective:   Physical Exam Well-developed male in no acute distress Nose without purulence or discharge noted Chest with mild decrease in breath sounds, no wheezes or rhonchi Cardiac exam with regular rate and rhythm Lower extremities without edema, no cyanosis Alert and oriented, moves all 4 extremities.       Assessment & Plan:

## 2011-11-11 ENCOUNTER — Encounter (HOSPITAL_COMMUNITY)
Admission: RE | Admit: 2011-11-11 | Discharge: 2011-11-11 | Disposition: A | Discharge status: Home / Self Care | Payer: Self-pay | Source: Ambulatory Visit | Attending: Cardiology | Admitting: Cardiology

## 2011-11-12 ENCOUNTER — Encounter (HOSPITAL_COMMUNITY)
Admission: RE | Admit: 2011-11-12 | Discharge: 2011-11-12 | Disposition: A | Discharge status: Home / Self Care | Payer: Self-pay | Source: Ambulatory Visit | Attending: Cardiology | Admitting: Cardiology

## 2011-11-14 ENCOUNTER — Encounter (HOSPITAL_COMMUNITY)
Admission: RE | Admit: 2011-11-14 | Discharge: 2011-11-14 | Disposition: A | Discharge status: Home / Self Care | Payer: Self-pay | Source: Ambulatory Visit | Attending: Cardiology | Admitting: Cardiology

## 2011-11-18 ENCOUNTER — Encounter (HOSPITAL_COMMUNITY): Payer: Self-pay

## 2011-11-19 ENCOUNTER — Encounter (HOSPITAL_COMMUNITY)
Admission: RE | Admit: 2011-11-19 | Discharge: 2011-11-19 | Disposition: A | Discharge status: Home / Self Care | Payer: Self-pay | Source: Ambulatory Visit | Attending: Cardiology | Admitting: Cardiology

## 2011-11-21 ENCOUNTER — Encounter (HOSPITAL_COMMUNITY): Payer: Self-pay

## 2011-11-25 ENCOUNTER — Encounter (HOSPITAL_COMMUNITY)
Admission: RE | Admit: 2011-11-25 | Discharge: 2011-11-25 | Disposition: A | Discharge status: Home / Self Care | Payer: Self-pay | Source: Ambulatory Visit | Attending: Cardiology | Admitting: Cardiology

## 2011-11-25 ENCOUNTER — Other Ambulatory Visit: Payer: Self-pay | Admitting: Pulmonary Disease

## 2011-11-26 ENCOUNTER — Encounter (HOSPITAL_COMMUNITY): Payer: Self-pay

## 2011-11-28 ENCOUNTER — Encounter (HOSPITAL_COMMUNITY)
Admission: RE | Admit: 2011-11-28 | Discharge: 2011-11-28 | Disposition: A | Discharge status: Home / Self Care | Payer: Self-pay | Source: Ambulatory Visit | Attending: Cardiology | Admitting: Cardiology

## 2011-12-02 ENCOUNTER — Encounter (HOSPITAL_COMMUNITY): Payer: Self-pay

## 2011-12-02 DIAGNOSIS — J449 Chronic obstructive pulmonary disease, unspecified: Secondary | ICD-10-CM | POA: Insufficient documentation

## 2011-12-02 DIAGNOSIS — E785 Hyperlipidemia, unspecified: Secondary | ICD-10-CM | POA: Insufficient documentation

## 2011-12-02 DIAGNOSIS — I2510 Atherosclerotic heart disease of native coronary artery without angina pectoris: Secondary | ICD-10-CM | POA: Insufficient documentation

## 2011-12-02 DIAGNOSIS — Z8673 Personal history of transient ischemic attack (TIA), and cerebral infarction without residual deficits: Secondary | ICD-10-CM | POA: Insufficient documentation

## 2011-12-02 DIAGNOSIS — Z7902 Long term (current) use of antithrombotics/antiplatelets: Secondary | ICD-10-CM | POA: Insufficient documentation

## 2011-12-02 DIAGNOSIS — J4489 Other specified chronic obstructive pulmonary disease: Secondary | ICD-10-CM | POA: Insufficient documentation

## 2011-12-02 DIAGNOSIS — Z5189 Encounter for other specified aftercare: Secondary | ICD-10-CM | POA: Insufficient documentation

## 2011-12-02 DIAGNOSIS — Z9861 Coronary angioplasty status: Secondary | ICD-10-CM | POA: Insufficient documentation

## 2011-12-02 DIAGNOSIS — F17200 Nicotine dependence, unspecified, uncomplicated: Secondary | ICD-10-CM | POA: Insufficient documentation

## 2011-12-02 DIAGNOSIS — I10 Essential (primary) hypertension: Secondary | ICD-10-CM | POA: Insufficient documentation

## 2011-12-02 DIAGNOSIS — Z7982 Long term (current) use of aspirin: Secondary | ICD-10-CM | POA: Insufficient documentation

## 2011-12-02 DIAGNOSIS — K219 Gastro-esophageal reflux disease without esophagitis: Secondary | ICD-10-CM | POA: Insufficient documentation

## 2011-12-03 ENCOUNTER — Encounter (HOSPITAL_COMMUNITY)
Admission: RE | Admit: 2011-12-03 | Discharge: 2011-12-03 | Disposition: A | Discharge status: Home / Self Care | Payer: Self-pay | Source: Ambulatory Visit | Attending: Cardiology | Admitting: Cardiology

## 2011-12-05 ENCOUNTER — Encounter (HOSPITAL_COMMUNITY)
Admission: RE | Admit: 2011-12-05 | Discharge: 2011-12-05 | Disposition: A | Discharge status: Home / Self Care | Payer: Self-pay | Source: Ambulatory Visit | Attending: Cardiology | Admitting: Cardiology

## 2011-12-09 ENCOUNTER — Encounter (HOSPITAL_COMMUNITY)
Admission: RE | Admit: 2011-12-09 | Discharge: 2011-12-09 | Disposition: A | Discharge status: Home / Self Care | Payer: Self-pay | Source: Ambulatory Visit | Attending: Cardiology | Admitting: Cardiology

## 2011-12-10 ENCOUNTER — Encounter (HOSPITAL_COMMUNITY)
Admission: RE | Admit: 2011-12-10 | Discharge: 2011-12-10 | Disposition: A | Discharge status: Home / Self Care | Payer: Self-pay | Source: Ambulatory Visit | Attending: Cardiology | Admitting: Cardiology

## 2011-12-12 ENCOUNTER — Encounter (HOSPITAL_COMMUNITY)
Admission: RE | Admit: 2011-12-12 | Discharge: 2011-12-12 | Disposition: A | Discharge status: Home / Self Care | Payer: Self-pay | Source: Ambulatory Visit | Attending: Cardiology | Admitting: Cardiology

## 2011-12-16 ENCOUNTER — Encounter (HOSPITAL_COMMUNITY)
Admission: RE | Admit: 2011-12-16 | Discharge: 2011-12-16 | Disposition: A | Discharge status: Home / Self Care | Payer: Self-pay | Source: Ambulatory Visit | Attending: Cardiology | Admitting: Cardiology

## 2011-12-17 ENCOUNTER — Encounter (HOSPITAL_COMMUNITY)
Admission: RE | Admit: 2011-12-17 | Discharge: 2011-12-17 | Disposition: A | Discharge status: Home / Self Care | Payer: Self-pay | Source: Ambulatory Visit | Attending: Cardiology | Admitting: Cardiology

## 2011-12-19 ENCOUNTER — Encounter (HOSPITAL_COMMUNITY): Payer: Self-pay

## 2011-12-23 ENCOUNTER — Encounter (HOSPITAL_COMMUNITY)
Admission: RE | Admit: 2011-12-23 | Discharge: 2011-12-23 | Disposition: A | Discharge status: Home / Self Care | Payer: Self-pay | Source: Ambulatory Visit | Attending: Cardiology | Admitting: Cardiology

## 2011-12-24 ENCOUNTER — Encounter (HOSPITAL_COMMUNITY)
Admission: RE | Admit: 2011-12-24 | Discharge: 2011-12-24 | Disposition: A | Discharge status: Home / Self Care | Payer: Self-pay | Source: Ambulatory Visit | Attending: Cardiology | Admitting: Cardiology

## 2011-12-26 ENCOUNTER — Encounter (HOSPITAL_COMMUNITY): Payer: Self-pay

## 2011-12-30 ENCOUNTER — Encounter (HOSPITAL_COMMUNITY)
Admission: RE | Admit: 2011-12-30 | Discharge: 2011-12-30 | Disposition: A | Discharge status: Home / Self Care | Payer: Self-pay | Source: Ambulatory Visit | Attending: Cardiology | Admitting: Cardiology

## 2011-12-31 ENCOUNTER — Encounter (HOSPITAL_COMMUNITY)
Admission: RE | Admit: 2011-12-31 | Discharge: 2011-12-31 | Disposition: A | Discharge status: Home / Self Care | Payer: Self-pay | Source: Ambulatory Visit | Attending: Cardiology | Admitting: Cardiology

## 2012-01-02 ENCOUNTER — Encounter (HOSPITAL_COMMUNITY)
Admission: RE | Admit: 2012-01-02 | Discharge: 2012-01-02 | Disposition: A | Discharge status: Home / Self Care | Payer: Self-pay | Source: Ambulatory Visit | Attending: Cardiology | Admitting: Cardiology

## 2012-01-02 DIAGNOSIS — Z8673 Personal history of transient ischemic attack (TIA), and cerebral infarction without residual deficits: Secondary | ICD-10-CM | POA: Insufficient documentation

## 2012-01-02 DIAGNOSIS — Z7902 Long term (current) use of antithrombotics/antiplatelets: Secondary | ICD-10-CM | POA: Insufficient documentation

## 2012-01-02 DIAGNOSIS — J4489 Other specified chronic obstructive pulmonary disease: Secondary | ICD-10-CM | POA: Insufficient documentation

## 2012-01-02 DIAGNOSIS — I2510 Atherosclerotic heart disease of native coronary artery without angina pectoris: Secondary | ICD-10-CM | POA: Insufficient documentation

## 2012-01-02 DIAGNOSIS — K219 Gastro-esophageal reflux disease without esophagitis: Secondary | ICD-10-CM | POA: Insufficient documentation

## 2012-01-02 DIAGNOSIS — I10 Essential (primary) hypertension: Secondary | ICD-10-CM | POA: Insufficient documentation

## 2012-01-02 DIAGNOSIS — F17200 Nicotine dependence, unspecified, uncomplicated: Secondary | ICD-10-CM | POA: Insufficient documentation

## 2012-01-02 DIAGNOSIS — E785 Hyperlipidemia, unspecified: Secondary | ICD-10-CM | POA: Insufficient documentation

## 2012-01-02 DIAGNOSIS — J449 Chronic obstructive pulmonary disease, unspecified: Secondary | ICD-10-CM | POA: Insufficient documentation

## 2012-01-02 DIAGNOSIS — Z5189 Encounter for other specified aftercare: Secondary | ICD-10-CM | POA: Insufficient documentation

## 2012-01-02 DIAGNOSIS — Z7982 Long term (current) use of aspirin: Secondary | ICD-10-CM | POA: Insufficient documentation

## 2012-01-02 DIAGNOSIS — Z9861 Coronary angioplasty status: Secondary | ICD-10-CM | POA: Insufficient documentation

## 2012-01-06 ENCOUNTER — Encounter (HOSPITAL_COMMUNITY)
Admission: RE | Admit: 2012-01-06 | Discharge: 2012-01-06 | Disposition: A | Discharge status: Home / Self Care | Payer: Self-pay | Source: Ambulatory Visit | Attending: Cardiology | Admitting: Cardiology

## 2012-01-07 ENCOUNTER — Encounter (HOSPITAL_COMMUNITY)
Admission: RE | Admit: 2012-01-07 | Discharge: 2012-01-07 | Disposition: A | Discharge status: Home / Self Care | Payer: Self-pay | Source: Ambulatory Visit

## 2012-01-09 ENCOUNTER — Encounter (HOSPITAL_COMMUNITY): Payer: Self-pay

## 2012-01-13 ENCOUNTER — Encounter (HOSPITAL_COMMUNITY)
Admission: RE | Admit: 2012-01-13 | Discharge: 2012-01-13 | Disposition: A | Discharge status: Home / Self Care | Payer: Self-pay | Source: Ambulatory Visit | Attending: Cardiology | Admitting: Cardiology

## 2012-01-14 ENCOUNTER — Encounter (HOSPITAL_COMMUNITY): Payer: Self-pay

## 2012-01-16 ENCOUNTER — Encounter (HOSPITAL_COMMUNITY)
Admission: RE | Admit: 2012-01-16 | Discharge: 2012-01-16 | Disposition: A | Discharge status: Home / Self Care | Payer: Self-pay | Source: Ambulatory Visit | Attending: Cardiology | Admitting: Cardiology

## 2012-01-20 ENCOUNTER — Encounter (HOSPITAL_COMMUNITY)
Admission: RE | Admit: 2012-01-20 | Discharge: 2012-01-20 | Disposition: A | Discharge status: Home / Self Care | Payer: Self-pay | Source: Ambulatory Visit | Attending: Cardiology | Admitting: Cardiology

## 2012-01-21 ENCOUNTER — Encounter (HOSPITAL_COMMUNITY)
Admission: RE | Admit: 2012-01-21 | Discharge: 2012-01-21 | Disposition: A | Discharge status: Home / Self Care | Payer: Self-pay | Source: Ambulatory Visit | Attending: Cardiology | Admitting: Cardiology

## 2012-01-23 ENCOUNTER — Encounter (HOSPITAL_COMMUNITY)
Admission: RE | Admit: 2012-01-23 | Discharge: 2012-01-23 | Disposition: A | Discharge status: Home / Self Care | Payer: Self-pay | Source: Ambulatory Visit | Attending: Cardiology | Admitting: Cardiology

## 2012-01-28 ENCOUNTER — Encounter (HOSPITAL_COMMUNITY)
Admission: RE | Admit: 2012-01-28 | Discharge: 2012-01-28 | Disposition: A | Discharge status: Home / Self Care | Payer: Self-pay | Source: Ambulatory Visit | Attending: Cardiology | Admitting: Cardiology

## 2012-01-30 ENCOUNTER — Encounter (HOSPITAL_COMMUNITY)
Admission: RE | Admit: 2012-01-30 | Discharge: 2012-01-30 | Disposition: A | Discharge status: Home / Self Care | Payer: Self-pay | Source: Ambulatory Visit | Attending: Cardiology | Admitting: Cardiology

## 2012-02-03 ENCOUNTER — Encounter (HOSPITAL_COMMUNITY)
Admission: RE | Admit: 2012-02-03 | Discharge: 2012-02-03 | Disposition: A | Discharge status: Home / Self Care | Payer: Self-pay | Source: Ambulatory Visit | Attending: Cardiology | Admitting: Cardiology

## 2012-02-03 DIAGNOSIS — I2510 Atherosclerotic heart disease of native coronary artery without angina pectoris: Secondary | ICD-10-CM | POA: Insufficient documentation

## 2012-02-03 DIAGNOSIS — Z5189 Encounter for other specified aftercare: Secondary | ICD-10-CM | POA: Insufficient documentation

## 2012-02-03 DIAGNOSIS — K219 Gastro-esophageal reflux disease without esophagitis: Secondary | ICD-10-CM | POA: Insufficient documentation

## 2012-02-03 DIAGNOSIS — E785 Hyperlipidemia, unspecified: Secondary | ICD-10-CM | POA: Insufficient documentation

## 2012-02-03 DIAGNOSIS — F17200 Nicotine dependence, unspecified, uncomplicated: Secondary | ICD-10-CM | POA: Insufficient documentation

## 2012-02-03 DIAGNOSIS — Z8673 Personal history of transient ischemic attack (TIA), and cerebral infarction without residual deficits: Secondary | ICD-10-CM | POA: Insufficient documentation

## 2012-02-03 DIAGNOSIS — I10 Essential (primary) hypertension: Secondary | ICD-10-CM | POA: Insufficient documentation

## 2012-02-03 DIAGNOSIS — Z9861 Coronary angioplasty status: Secondary | ICD-10-CM | POA: Insufficient documentation

## 2012-02-03 DIAGNOSIS — Z7902 Long term (current) use of antithrombotics/antiplatelets: Secondary | ICD-10-CM | POA: Insufficient documentation

## 2012-02-03 DIAGNOSIS — J4489 Other specified chronic obstructive pulmonary disease: Secondary | ICD-10-CM | POA: Insufficient documentation

## 2012-02-03 DIAGNOSIS — Z7982 Long term (current) use of aspirin: Secondary | ICD-10-CM | POA: Insufficient documentation

## 2012-02-03 DIAGNOSIS — J449 Chronic obstructive pulmonary disease, unspecified: Secondary | ICD-10-CM | POA: Insufficient documentation

## 2012-02-04 ENCOUNTER — Encounter (HOSPITAL_COMMUNITY)
Admission: RE | Admit: 2012-02-04 | Discharge: 2012-02-04 | Disposition: A | Discharge status: Home / Self Care | Payer: Self-pay | Source: Ambulatory Visit | Attending: Cardiology | Admitting: Cardiology

## 2012-02-06 ENCOUNTER — Encounter (HOSPITAL_COMMUNITY)
Admission: RE | Admit: 2012-02-06 | Discharge: 2012-02-06 | Disposition: A | Discharge status: Home / Self Care | Payer: Self-pay | Source: Ambulatory Visit | Attending: Cardiology | Admitting: Cardiology

## 2012-02-10 ENCOUNTER — Encounter (HOSPITAL_COMMUNITY)
Admission: RE | Admit: 2012-02-10 | Discharge: 2012-02-10 | Disposition: A | Discharge status: Home / Self Care | Payer: Self-pay | Source: Ambulatory Visit | Attending: Cardiology | Admitting: Cardiology

## 2012-02-11 ENCOUNTER — Encounter (HOSPITAL_COMMUNITY): Payer: Self-pay

## 2012-02-13 ENCOUNTER — Encounter (HOSPITAL_COMMUNITY)
Admission: RE | Admit: 2012-02-13 | Discharge: 2012-02-13 | Disposition: A | Discharge status: Home / Self Care | Payer: Self-pay | Source: Ambulatory Visit | Attending: Cardiology | Admitting: Cardiology

## 2012-02-17 ENCOUNTER — Encounter (HOSPITAL_COMMUNITY)
Admission: RE | Admit: 2012-02-17 | Discharge: 2012-02-17 | Disposition: A | Discharge status: Home / Self Care | Payer: Self-pay | Source: Ambulatory Visit | Attending: Cardiology | Admitting: Cardiology

## 2012-02-18 ENCOUNTER — Encounter (HOSPITAL_COMMUNITY): Payer: Self-pay

## 2012-02-20 ENCOUNTER — Encounter (HOSPITAL_COMMUNITY)
Admission: RE | Admit: 2012-02-20 | Discharge: 2012-02-20 | Disposition: A | Discharge status: Home / Self Care | Payer: Self-pay | Source: Ambulatory Visit | Attending: Cardiology | Admitting: Cardiology

## 2012-02-24 ENCOUNTER — Encounter (HOSPITAL_COMMUNITY)
Admission: RE | Admit: 2012-02-24 | Discharge: 2012-02-24 | Disposition: A | Discharge status: Home / Self Care | Payer: Self-pay | Source: Ambulatory Visit | Attending: Cardiology | Admitting: Cardiology

## 2012-02-25 ENCOUNTER — Encounter (HOSPITAL_COMMUNITY)
Admission: RE | Admit: 2012-02-25 | Discharge: 2012-02-25 | Disposition: A | Discharge status: Home / Self Care | Payer: Self-pay | Source: Ambulatory Visit | Attending: Cardiology | Admitting: Cardiology

## 2012-02-27 ENCOUNTER — Encounter (HOSPITAL_COMMUNITY)
Admission: RE | Admit: 2012-02-27 | Discharge: 2012-02-27 | Disposition: A | Discharge status: Home / Self Care | Payer: Self-pay | Source: Ambulatory Visit | Attending: Cardiology | Admitting: Cardiology

## 2012-03-02 ENCOUNTER — Encounter (HOSPITAL_COMMUNITY): Payer: Self-pay

## 2012-03-02 DIAGNOSIS — Z7982 Long term (current) use of aspirin: Secondary | ICD-10-CM | POA: Insufficient documentation

## 2012-03-02 DIAGNOSIS — Z5189 Encounter for other specified aftercare: Secondary | ICD-10-CM | POA: Insufficient documentation

## 2012-03-02 DIAGNOSIS — Z8673 Personal history of transient ischemic attack (TIA), and cerebral infarction without residual deficits: Secondary | ICD-10-CM | POA: Insufficient documentation

## 2012-03-02 DIAGNOSIS — Z7902 Long term (current) use of antithrombotics/antiplatelets: Secondary | ICD-10-CM | POA: Insufficient documentation

## 2012-03-02 DIAGNOSIS — I10 Essential (primary) hypertension: Secondary | ICD-10-CM | POA: Insufficient documentation

## 2012-03-02 DIAGNOSIS — I2510 Atherosclerotic heart disease of native coronary artery without angina pectoris: Secondary | ICD-10-CM | POA: Insufficient documentation

## 2012-03-02 DIAGNOSIS — J449 Chronic obstructive pulmonary disease, unspecified: Secondary | ICD-10-CM | POA: Insufficient documentation

## 2012-03-02 DIAGNOSIS — E785 Hyperlipidemia, unspecified: Secondary | ICD-10-CM | POA: Insufficient documentation

## 2012-03-02 DIAGNOSIS — Z9861 Coronary angioplasty status: Secondary | ICD-10-CM | POA: Insufficient documentation

## 2012-03-02 DIAGNOSIS — F17200 Nicotine dependence, unspecified, uncomplicated: Secondary | ICD-10-CM | POA: Insufficient documentation

## 2012-03-02 DIAGNOSIS — J4489 Other specified chronic obstructive pulmonary disease: Secondary | ICD-10-CM | POA: Insufficient documentation

## 2012-03-02 DIAGNOSIS — K219 Gastro-esophageal reflux disease without esophagitis: Secondary | ICD-10-CM | POA: Insufficient documentation

## 2012-03-03 ENCOUNTER — Encounter (HOSPITAL_COMMUNITY)
Admission: RE | Admit: 2012-03-03 | Discharge: 2012-03-03 | Disposition: A | Discharge status: Home / Self Care | Payer: Self-pay | Source: Ambulatory Visit | Attending: Cardiology | Admitting: Cardiology

## 2012-03-05 ENCOUNTER — Encounter (HOSPITAL_COMMUNITY): Payer: Self-pay

## 2012-03-09 ENCOUNTER — Encounter (HOSPITAL_COMMUNITY): Payer: Self-pay

## 2012-03-10 ENCOUNTER — Encounter (HOSPITAL_COMMUNITY)
Admission: RE | Admit: 2012-03-10 | Discharge: 2012-03-10 | Disposition: A | Discharge status: Home / Self Care | Payer: Self-pay | Source: Ambulatory Visit | Attending: Cardiology | Admitting: Cardiology

## 2012-03-12 ENCOUNTER — Encounter (HOSPITAL_COMMUNITY)
Admission: RE | Admit: 2012-03-12 | Discharge: 2012-03-12 | Disposition: A | Discharge status: Home / Self Care | Payer: Self-pay | Source: Ambulatory Visit | Attending: Cardiology | Admitting: Cardiology

## 2012-03-16 ENCOUNTER — Encounter (HOSPITAL_COMMUNITY)
Admission: RE | Admit: 2012-03-16 | Discharge: 2012-03-16 | Disposition: A | Discharge status: Home / Self Care | Payer: Self-pay | Source: Ambulatory Visit | Attending: Cardiology | Admitting: Cardiology

## 2012-03-17 ENCOUNTER — Encounter (HOSPITAL_COMMUNITY)
Admission: RE | Admit: 2012-03-17 | Discharge: 2012-03-17 | Disposition: A | Discharge status: Home / Self Care | Payer: Self-pay | Source: Ambulatory Visit | Attending: Cardiology | Admitting: Cardiology

## 2012-03-19 ENCOUNTER — Encounter (HOSPITAL_COMMUNITY)
Admission: RE | Admit: 2012-03-19 | Discharge: 2012-03-19 | Disposition: A | Discharge status: Home / Self Care | Payer: Self-pay | Source: Ambulatory Visit | Attending: Cardiology | Admitting: Cardiology

## 2012-03-23 ENCOUNTER — Encounter (HOSPITAL_COMMUNITY)
Admission: RE | Admit: 2012-03-23 | Discharge: 2012-03-23 | Disposition: A | Discharge status: Home / Self Care | Payer: Self-pay | Source: Ambulatory Visit | Attending: Cardiology | Admitting: Cardiology

## 2012-03-24 ENCOUNTER — Encounter (HOSPITAL_COMMUNITY)
Admission: RE | Admit: 2012-03-24 | Discharge: 2012-03-24 | Disposition: A | Discharge status: Home / Self Care | Payer: Self-pay | Source: Ambulatory Visit | Attending: Cardiology | Admitting: Cardiology

## 2012-03-26 ENCOUNTER — Encounter (HOSPITAL_COMMUNITY): Payer: Self-pay

## 2012-03-30 ENCOUNTER — Encounter (HOSPITAL_COMMUNITY): Payer: Self-pay

## 2012-03-31 ENCOUNTER — Encounter (HOSPITAL_COMMUNITY): Payer: Self-pay

## 2012-04-02 ENCOUNTER — Encounter (HOSPITAL_COMMUNITY): Payer: Self-pay

## 2012-04-02 DIAGNOSIS — Z7982 Long term (current) use of aspirin: Secondary | ICD-10-CM | POA: Insufficient documentation

## 2012-04-02 DIAGNOSIS — F17200 Nicotine dependence, unspecified, uncomplicated: Secondary | ICD-10-CM | POA: Insufficient documentation

## 2012-04-02 DIAGNOSIS — Z7902 Long term (current) use of antithrombotics/antiplatelets: Secondary | ICD-10-CM | POA: Insufficient documentation

## 2012-04-02 DIAGNOSIS — Z9861 Coronary angioplasty status: Secondary | ICD-10-CM | POA: Insufficient documentation

## 2012-04-02 DIAGNOSIS — I2510 Atherosclerotic heart disease of native coronary artery without angina pectoris: Secondary | ICD-10-CM | POA: Insufficient documentation

## 2012-04-02 DIAGNOSIS — I10 Essential (primary) hypertension: Secondary | ICD-10-CM | POA: Insufficient documentation

## 2012-04-02 DIAGNOSIS — Z8673 Personal history of transient ischemic attack (TIA), and cerebral infarction without residual deficits: Secondary | ICD-10-CM | POA: Insufficient documentation

## 2012-04-02 DIAGNOSIS — K219 Gastro-esophageal reflux disease without esophagitis: Secondary | ICD-10-CM | POA: Insufficient documentation

## 2012-04-02 DIAGNOSIS — J4489 Other specified chronic obstructive pulmonary disease: Secondary | ICD-10-CM | POA: Insufficient documentation

## 2012-04-02 DIAGNOSIS — E785 Hyperlipidemia, unspecified: Secondary | ICD-10-CM | POA: Insufficient documentation

## 2012-04-02 DIAGNOSIS — Z5189 Encounter for other specified aftercare: Secondary | ICD-10-CM | POA: Insufficient documentation

## 2012-04-02 DIAGNOSIS — J449 Chronic obstructive pulmonary disease, unspecified: Secondary | ICD-10-CM | POA: Insufficient documentation

## 2012-04-06 ENCOUNTER — Encounter (HOSPITAL_COMMUNITY)
Admission: RE | Admit: 2012-04-06 | Discharge: 2012-04-06 | Disposition: A | Discharge status: Home / Self Care | Payer: Self-pay | Source: Ambulatory Visit | Attending: Cardiology | Admitting: Cardiology

## 2012-04-07 ENCOUNTER — Encounter (HOSPITAL_COMMUNITY)
Admission: RE | Admit: 2012-04-07 | Discharge: 2012-04-07 | Disposition: A | Discharge status: Home / Self Care | Payer: Self-pay | Source: Ambulatory Visit | Attending: Cardiology | Admitting: Cardiology

## 2012-04-09 ENCOUNTER — Encounter (HOSPITAL_COMMUNITY)
Admission: RE | Admit: 2012-04-09 | Discharge: 2012-04-09 | Disposition: A | Discharge status: Home / Self Care | Payer: Self-pay | Source: Ambulatory Visit | Attending: Cardiology | Admitting: Cardiology

## 2012-04-13 ENCOUNTER — Encounter (HOSPITAL_COMMUNITY)
Admission: RE | Admit: 2012-04-13 | Discharge: 2012-04-13 | Disposition: A | Discharge status: Home / Self Care | Payer: Self-pay | Source: Ambulatory Visit | Attending: Cardiology | Admitting: Cardiology

## 2012-04-14 ENCOUNTER — Other Ambulatory Visit: Payer: Self-pay | Admitting: Cardiology

## 2012-04-14 ENCOUNTER — Encounter (HOSPITAL_COMMUNITY)
Admission: RE | Admit: 2012-04-14 | Discharge: 2012-04-14 | Disposition: A | Discharge status: Home / Self Care | Payer: Self-pay | Source: Ambulatory Visit | Attending: Cardiology | Admitting: Cardiology

## 2012-04-16 ENCOUNTER — Encounter (HOSPITAL_COMMUNITY)
Admission: RE | Admit: 2012-04-16 | Discharge: 2012-04-16 | Disposition: A | Discharge status: Home / Self Care | Payer: Self-pay | Source: Ambulatory Visit | Attending: Cardiology | Admitting: Cardiology

## 2012-04-20 ENCOUNTER — Encounter (HOSPITAL_COMMUNITY)
Admission: RE | Admit: 2012-04-20 | Discharge: 2012-04-20 | Disposition: A | Discharge status: Home / Self Care | Payer: Self-pay | Source: Ambulatory Visit | Attending: Cardiology | Admitting: Cardiology

## 2012-04-21 ENCOUNTER — Encounter (HOSPITAL_COMMUNITY): Payer: Self-pay

## 2012-04-23 ENCOUNTER — Encounter (HOSPITAL_COMMUNITY)
Admission: RE | Admit: 2012-04-23 | Discharge: 2012-04-23 | Disposition: A | Discharge status: Home / Self Care | Payer: Self-pay | Source: Ambulatory Visit | Attending: Cardiology | Admitting: Cardiology

## 2012-04-27 ENCOUNTER — Encounter (HOSPITAL_COMMUNITY)
Admission: RE | Admit: 2012-04-27 | Discharge: 2012-04-27 | Disposition: A | Discharge status: Home / Self Care | Payer: Self-pay | Source: Ambulatory Visit | Attending: Cardiology | Admitting: Cardiology

## 2012-04-28 ENCOUNTER — Encounter (HOSPITAL_COMMUNITY): Payer: Self-pay

## 2012-04-30 ENCOUNTER — Encounter (HOSPITAL_COMMUNITY): Payer: Self-pay

## 2012-05-05 ENCOUNTER — Encounter (HOSPITAL_COMMUNITY)
Admission: RE | Admit: 2012-05-05 | Discharge: 2012-05-05 | Disposition: A | Discharge status: Home / Self Care | Payer: Self-pay | Source: Ambulatory Visit | Attending: Cardiology | Admitting: Cardiology

## 2012-05-05 DIAGNOSIS — Z5189 Encounter for other specified aftercare: Secondary | ICD-10-CM | POA: Insufficient documentation

## 2012-05-05 DIAGNOSIS — E785 Hyperlipidemia, unspecified: Secondary | ICD-10-CM | POA: Insufficient documentation

## 2012-05-05 DIAGNOSIS — Z9861 Coronary angioplasty status: Secondary | ICD-10-CM | POA: Insufficient documentation

## 2012-05-05 DIAGNOSIS — J4489 Other specified chronic obstructive pulmonary disease: Secondary | ICD-10-CM | POA: Insufficient documentation

## 2012-05-05 DIAGNOSIS — I10 Essential (primary) hypertension: Secondary | ICD-10-CM | POA: Insufficient documentation

## 2012-05-05 DIAGNOSIS — J449 Chronic obstructive pulmonary disease, unspecified: Secondary | ICD-10-CM | POA: Insufficient documentation

## 2012-05-05 DIAGNOSIS — I2510 Atherosclerotic heart disease of native coronary artery without angina pectoris: Secondary | ICD-10-CM | POA: Insufficient documentation

## 2012-05-05 DIAGNOSIS — Z8673 Personal history of transient ischemic attack (TIA), and cerebral infarction without residual deficits: Secondary | ICD-10-CM | POA: Insufficient documentation

## 2012-05-05 DIAGNOSIS — Z7982 Long term (current) use of aspirin: Secondary | ICD-10-CM | POA: Insufficient documentation

## 2012-05-05 DIAGNOSIS — K219 Gastro-esophageal reflux disease without esophagitis: Secondary | ICD-10-CM | POA: Insufficient documentation

## 2012-05-05 DIAGNOSIS — Z7902 Long term (current) use of antithrombotics/antiplatelets: Secondary | ICD-10-CM | POA: Insufficient documentation

## 2012-05-05 DIAGNOSIS — F17200 Nicotine dependence, unspecified, uncomplicated: Secondary | ICD-10-CM | POA: Insufficient documentation

## 2012-05-07 ENCOUNTER — Encounter (HOSPITAL_COMMUNITY)
Admission: RE | Admit: 2012-05-07 | Discharge: 2012-05-07 | Disposition: A | Discharge status: Home / Self Care | Payer: Self-pay | Source: Ambulatory Visit | Attending: Cardiology | Admitting: Cardiology

## 2012-05-11 ENCOUNTER — Encounter (HOSPITAL_COMMUNITY)
Admission: RE | Admit: 2012-05-11 | Discharge: 2012-05-11 | Disposition: A | Discharge status: Home / Self Care | Payer: Self-pay | Source: Ambulatory Visit | Attending: Cardiology | Admitting: Cardiology

## 2012-05-12 ENCOUNTER — Encounter (HOSPITAL_COMMUNITY)
Admission: RE | Admit: 2012-05-12 | Discharge: 2012-05-12 | Disposition: A | Discharge status: Home / Self Care | Payer: Self-pay | Source: Ambulatory Visit | Attending: Cardiology | Admitting: Cardiology

## 2012-05-14 ENCOUNTER — Encounter: Payer: Self-pay | Admitting: Pulmonary Disease

## 2012-05-14 ENCOUNTER — Ambulatory Visit (INDEPENDENT_AMBULATORY_CARE_PROVIDER_SITE_OTHER): Payer: BC Managed Care – PPO | Admitting: Pulmonary Disease

## 2012-05-14 ENCOUNTER — Encounter (HOSPITAL_COMMUNITY): Payer: Self-pay

## 2012-05-14 VITALS — BP 122/66 | HR 79 | Temp 97.9°F | Ht 66.0 in | Wt 174.4 lb

## 2012-05-14 DIAGNOSIS — J449 Chronic obstructive pulmonary disease, unspecified: Secondary | ICD-10-CM

## 2012-05-14 DIAGNOSIS — Z23 Encounter for immunization: Secondary | ICD-10-CM

## 2012-05-14 NOTE — Patient Instructions (Addendum)
Take the symbicort twice a [place] everyday.  Keep mouth rinsed. Stop smoking.  This is the key. Will give you the flu shot today. followup with me in 6mos.

## 2012-05-14 NOTE — Assessment & Plan Note (Signed)
The patient overall is stable despite continuing to smoke and being noncompliant with symbicort.  I have stressed to him the importance of taking this twice a [place], as well as total smoking cessation.  I have also asked him to try and become more active, and work on weight reduction.  If doing well, he will followup with me in 6 months

## 2012-05-14 NOTE — Progress Notes (Signed)
  Subjective:    Patient ID: Brad Turner, male    DOB: 1940/10/28, 71 y.o.   MRN: [id number]  HPI Patient comes in today for followup of his known COPD.  Unfortunately, he continues to be noncompliant with symbicort and continues to smoke.  He has a mild cough with occasional mucus production, but has not had an acute exacerbation or pulmonary infections since last visit.  He is attending cardiac rehabilitation, but is not exercising on his own   Review of Systems  Constitutional: Negative for fever and unexpected weight change.  HENT: Positive for congestion. Negative for ear pain, nosebleeds, sore throat, rhinorrhea, sneezing, trouble swallowing, dental problem, postnasal drip and sinus pressure.   Eyes: Negative for redness and itching.  Respiratory: Negative for cough, chest tightness, shortness of breath and wheezing.   Cardiovascular: Positive for leg swelling. Negative for palpitations.  Gastrointestinal: Negative for nausea and vomiting.  Genitourinary: Negative for dysuria.  Musculoskeletal: Negative for joint swelling.  Skin: Negative for rash.  Neurological: Negative for headaches.  Hematological: Bruises/bleeds easily.  Psychiatric/Behavioral: Negative for dysphoric mood. The patient is not nervous/anxious.        Objective:   Physical Exam Well-developed male in no acute distress Nose without purulence or discharge noted Chest with mild decrease in breath sounds, no wheezes or rhonchi Cardiac exam regular rate and rhythm Lower extremities with minimal ankle edema, no cyanosis Alert and oriented, moves all 4 extremities.       Assessment & Plan:

## 2012-05-18 ENCOUNTER — Encounter (HOSPITAL_COMMUNITY)
Admission: RE | Admit: 2012-05-18 | Discharge: 2012-05-18 | Disposition: A | Discharge status: Home / Self Care | Payer: Self-pay | Source: Ambulatory Visit | Attending: Cardiology | Admitting: Cardiology

## 2012-05-19 ENCOUNTER — Encounter (HOSPITAL_COMMUNITY)
Admission: RE | Admit: 2012-05-19 | Discharge: 2012-05-19 | Disposition: A | Discharge status: Home / Self Care | Payer: Self-pay | Source: Ambulatory Visit | Attending: Cardiology | Admitting: Cardiology

## 2012-05-21 ENCOUNTER — Encounter (HOSPITAL_COMMUNITY)
Admission: RE | Admit: 2012-05-21 | Discharge: 2012-05-21 | Disposition: A | Discharge status: Home / Self Care | Payer: Self-pay | Source: Ambulatory Visit | Attending: Cardiology | Admitting: Cardiology

## 2012-05-25 ENCOUNTER — Encounter (HOSPITAL_COMMUNITY)
Admission: RE | Admit: 2012-05-25 | Discharge: 2012-05-25 | Disposition: A | Discharge status: Home / Self Care | Payer: Self-pay | Source: Ambulatory Visit | Attending: Cardiology | Admitting: Cardiology

## 2012-05-26 ENCOUNTER — Encounter (HOSPITAL_COMMUNITY)
Admission: RE | Admit: 2012-05-26 | Discharge: 2012-05-26 | Disposition: A | Discharge status: Home / Self Care | Payer: Self-pay | Source: Ambulatory Visit | Attending: Cardiology | Admitting: Cardiology

## 2012-05-28 ENCOUNTER — Encounter (HOSPITAL_COMMUNITY): Payer: Self-pay

## 2012-06-01 ENCOUNTER — Encounter (HOSPITAL_COMMUNITY)
Admission: RE | Admit: 2012-06-01 | Discharge: 2012-06-01 | Disposition: A | Discharge status: Home / Self Care | Payer: Self-pay | Source: Ambulatory Visit | Attending: Cardiology | Admitting: Cardiology

## 2012-06-02 ENCOUNTER — Encounter (HOSPITAL_COMMUNITY)
Admission: RE | Admit: 2012-06-02 | Discharge: 2012-06-02 | Disposition: A | Discharge status: Home / Self Care | Payer: Self-pay | Source: Ambulatory Visit | Attending: Cardiology | Admitting: Cardiology

## 2012-06-02 DIAGNOSIS — E785 Hyperlipidemia, unspecified: Secondary | ICD-10-CM | POA: Insufficient documentation

## 2012-06-02 DIAGNOSIS — Z9861 Coronary angioplasty status: Secondary | ICD-10-CM | POA: Insufficient documentation

## 2012-06-02 DIAGNOSIS — J4489 Other specified chronic obstructive pulmonary disease: Secondary | ICD-10-CM | POA: Insufficient documentation

## 2012-06-02 DIAGNOSIS — F17200 Nicotine dependence, unspecified, uncomplicated: Secondary | ICD-10-CM | POA: Insufficient documentation

## 2012-06-02 DIAGNOSIS — I2510 Atherosclerotic heart disease of native coronary artery without angina pectoris: Secondary | ICD-10-CM | POA: Insufficient documentation

## 2012-06-02 DIAGNOSIS — Z7982 Long term (current) use of aspirin: Secondary | ICD-10-CM | POA: Insufficient documentation

## 2012-06-02 DIAGNOSIS — Z5189 Encounter for other specified aftercare: Secondary | ICD-10-CM | POA: Insufficient documentation

## 2012-06-02 DIAGNOSIS — J449 Chronic obstructive pulmonary disease, unspecified: Secondary | ICD-10-CM | POA: Insufficient documentation

## 2012-06-02 DIAGNOSIS — I10 Essential (primary) hypertension: Secondary | ICD-10-CM | POA: Insufficient documentation

## 2012-06-02 DIAGNOSIS — Z8673 Personal history of transient ischemic attack (TIA), and cerebral infarction without residual deficits: Secondary | ICD-10-CM | POA: Insufficient documentation

## 2012-06-02 DIAGNOSIS — Z7902 Long term (current) use of antithrombotics/antiplatelets: Secondary | ICD-10-CM | POA: Insufficient documentation

## 2012-06-02 DIAGNOSIS — K219 Gastro-esophageal reflux disease without esophagitis: Secondary | ICD-10-CM | POA: Insufficient documentation

## 2012-06-04 ENCOUNTER — Encounter (HOSPITAL_COMMUNITY)
Admission: RE | Admit: 2012-06-04 | Discharge: 2012-06-04 | Disposition: A | Discharge status: Home / Self Care | Payer: Self-pay | Source: Ambulatory Visit | Attending: Cardiology | Admitting: Cardiology

## 2012-06-08 ENCOUNTER — Encounter (HOSPITAL_COMMUNITY)
Admission: RE | Admit: 2012-06-08 | Discharge: 2012-06-08 | Disposition: A | Discharge status: Home / Self Care | Payer: Self-pay | Source: Ambulatory Visit | Attending: Cardiology | Admitting: Cardiology

## 2012-06-09 ENCOUNTER — Encounter (HOSPITAL_COMMUNITY)
Admission: RE | Admit: 2012-06-09 | Discharge: 2012-06-09 | Disposition: A | Discharge status: Home / Self Care | Payer: Self-pay | Source: Ambulatory Visit | Attending: Cardiology | Admitting: Cardiology

## 2012-06-11 ENCOUNTER — Encounter (HOSPITAL_COMMUNITY)
Admission: RE | Admit: 2012-06-11 | Discharge: 2012-06-11 | Disposition: A | Discharge status: Home / Self Care | Payer: Self-pay | Source: Ambulatory Visit | Attending: Cardiology | Admitting: Cardiology

## 2012-06-15 ENCOUNTER — Encounter (HOSPITAL_COMMUNITY)
Admission: RE | Admit: 2012-06-15 | Discharge: 2012-06-15 | Disposition: A | Discharge status: Home / Self Care | Payer: Self-pay | Source: Ambulatory Visit | Attending: Cardiology | Admitting: Cardiology

## 2012-06-16 ENCOUNTER — Encounter (HOSPITAL_COMMUNITY)
Admission: RE | Admit: 2012-06-16 | Discharge: 2012-06-16 | Disposition: A | Discharge status: Home / Self Care | Payer: Self-pay | Source: Ambulatory Visit | Attending: Cardiology | Admitting: Cardiology

## 2012-06-18 ENCOUNTER — Encounter (HOSPITAL_COMMUNITY)
Admission: RE | Admit: 2012-06-18 | Discharge: 2012-06-18 | Disposition: A | Discharge status: Home / Self Care | Payer: Self-pay | Source: Ambulatory Visit | Attending: Cardiology | Admitting: Cardiology

## 2012-06-19 ENCOUNTER — Encounter: Payer: Self-pay | Admitting: Gastroenterology

## 2012-06-22 ENCOUNTER — Encounter (HOSPITAL_COMMUNITY)
Admission: RE | Admit: 2012-06-22 | Discharge: 2012-06-22 | Disposition: A | Discharge status: Home / Self Care | Payer: Self-pay | Source: Ambulatory Visit | Attending: Cardiology | Admitting: Cardiology

## 2012-06-23 ENCOUNTER — Encounter (HOSPITAL_COMMUNITY)
Admission: RE | Admit: 2012-06-23 | Discharge: 2012-06-23 | Disposition: A | Discharge status: Home / Self Care | Payer: Self-pay | Source: Ambulatory Visit | Attending: Cardiology | Admitting: Cardiology

## 2012-06-25 ENCOUNTER — Encounter (HOSPITAL_COMMUNITY)
Admission: RE | Admit: 2012-06-25 | Discharge: 2012-06-25 | Disposition: A | Discharge status: Home / Self Care | Payer: Self-pay | Source: Ambulatory Visit | Attending: Cardiology | Admitting: Cardiology

## 2012-06-29 ENCOUNTER — Encounter (HOSPITAL_COMMUNITY)
Admission: RE | Admit: 2012-06-29 | Discharge: 2012-06-29 | Disposition: A | Discharge status: Home / Self Care | Payer: Self-pay | Source: Ambulatory Visit | Attending: Cardiology | Admitting: Cardiology

## 2012-06-30 ENCOUNTER — Encounter (HOSPITAL_COMMUNITY)
Admission: RE | Admit: 2012-06-30 | Discharge: 2012-06-30 | Disposition: A | Discharge status: Home / Self Care | Payer: Self-pay | Source: Ambulatory Visit | Attending: Cardiology | Admitting: Cardiology

## 2012-07-02 ENCOUNTER — Encounter (HOSPITAL_COMMUNITY)
Admission: RE | Admit: 2012-07-02 | Discharge: 2012-07-02 | Disposition: A | Discharge status: Home / Self Care | Payer: Self-pay | Source: Ambulatory Visit | Attending: Cardiology | Admitting: Cardiology

## 2012-07-06 ENCOUNTER — Encounter (HOSPITAL_COMMUNITY)
Admission: RE | Admit: 2012-07-06 | Discharge: 2012-07-06 | Disposition: A | Discharge status: Home / Self Care | Payer: Self-pay | Source: Ambulatory Visit | Attending: Cardiology | Admitting: Cardiology

## 2012-07-06 DIAGNOSIS — J449 Chronic obstructive pulmonary disease, unspecified: Secondary | ICD-10-CM | POA: Insufficient documentation

## 2012-07-06 DIAGNOSIS — I10 Essential (primary) hypertension: Secondary | ICD-10-CM | POA: Insufficient documentation

## 2012-07-06 DIAGNOSIS — Z9861 Coronary angioplasty status: Secondary | ICD-10-CM | POA: Insufficient documentation

## 2012-07-06 DIAGNOSIS — Z5189 Encounter for other specified aftercare: Secondary | ICD-10-CM | POA: Insufficient documentation

## 2012-07-06 DIAGNOSIS — I2510 Atherosclerotic heart disease of native coronary artery without angina pectoris: Secondary | ICD-10-CM | POA: Insufficient documentation

## 2012-07-06 DIAGNOSIS — K219 Gastro-esophageal reflux disease without esophagitis: Secondary | ICD-10-CM | POA: Insufficient documentation

## 2012-07-06 DIAGNOSIS — E785 Hyperlipidemia, unspecified: Secondary | ICD-10-CM | POA: Insufficient documentation

## 2012-07-06 DIAGNOSIS — Z7902 Long term (current) use of antithrombotics/antiplatelets: Secondary | ICD-10-CM | POA: Insufficient documentation

## 2012-07-06 DIAGNOSIS — F17200 Nicotine dependence, unspecified, uncomplicated: Secondary | ICD-10-CM | POA: Insufficient documentation

## 2012-07-06 DIAGNOSIS — Z7982 Long term (current) use of aspirin: Secondary | ICD-10-CM | POA: Insufficient documentation

## 2012-07-06 DIAGNOSIS — J4489 Other specified chronic obstructive pulmonary disease: Secondary | ICD-10-CM | POA: Insufficient documentation

## 2012-07-06 DIAGNOSIS — Z8673 Personal history of transient ischemic attack (TIA), and cerebral infarction without residual deficits: Secondary | ICD-10-CM | POA: Insufficient documentation

## 2012-07-07 ENCOUNTER — Encounter (HOSPITAL_COMMUNITY)
Admission: RE | Admit: 2012-07-07 | Discharge: 2012-07-07 | Disposition: A | Discharge status: Home / Self Care | Payer: Self-pay | Source: Ambulatory Visit | Attending: Cardiology | Admitting: Cardiology

## 2012-07-09 ENCOUNTER — Encounter (HOSPITAL_COMMUNITY)
Admission: RE | Admit: 2012-07-09 | Discharge: 2012-07-09 | Disposition: A | Discharge status: Home / Self Care | Payer: Self-pay | Source: Ambulatory Visit | Attending: Cardiology | Admitting: Cardiology

## 2012-07-13 ENCOUNTER — Encounter (HOSPITAL_COMMUNITY): Payer: Self-pay

## 2012-07-14 ENCOUNTER — Encounter (HOSPITAL_COMMUNITY): Payer: Self-pay

## 2012-07-16 ENCOUNTER — Encounter (HOSPITAL_COMMUNITY)
Admission: RE | Admit: 2012-07-16 | Discharge: 2012-07-16 | Disposition: A | Discharge status: Home / Self Care | Payer: Self-pay | Source: Ambulatory Visit | Attending: Cardiology | Admitting: Cardiology

## 2012-07-20 ENCOUNTER — Encounter (HOSPITAL_COMMUNITY)
Admission: RE | Admit: 2012-07-20 | Discharge: 2012-07-20 | Disposition: A | Discharge status: Home / Self Care | Payer: Self-pay | Source: Ambulatory Visit | Attending: Cardiology | Admitting: Cardiology

## 2012-07-21 ENCOUNTER — Encounter (HOSPITAL_COMMUNITY)
Admission: RE | Admit: 2012-07-21 | Discharge: 2012-07-21 | Disposition: A | Discharge status: Home / Self Care | Payer: Self-pay | Source: Ambulatory Visit | Attending: Cardiology | Admitting: Cardiology

## 2012-07-23 ENCOUNTER — Encounter (HOSPITAL_COMMUNITY): Payer: Self-pay

## 2012-07-27 ENCOUNTER — Encounter (HOSPITAL_COMMUNITY)
Admission: RE | Admit: 2012-07-27 | Discharge: 2012-07-27 | Disposition: A | Discharge status: Home / Self Care | Payer: Self-pay | Source: Ambulatory Visit | Attending: Cardiology | Admitting: Cardiology

## 2012-07-28 ENCOUNTER — Encounter (HOSPITAL_COMMUNITY)
Admission: RE | Admit: 2012-07-28 | Discharge: 2012-07-28 | Disposition: A | Discharge status: Home / Self Care | Payer: Self-pay | Source: Ambulatory Visit | Attending: Cardiology | Admitting: Cardiology

## 2012-08-03 ENCOUNTER — Encounter (HOSPITAL_COMMUNITY)
Admission: RE | Admit: 2012-08-03 | Discharge: 2012-08-03 | Disposition: A | Discharge status: Home / Self Care | Payer: Self-pay | Source: Ambulatory Visit | Attending: Cardiology | Admitting: Cardiology

## 2012-08-03 DIAGNOSIS — Z7902 Long term (current) use of antithrombotics/antiplatelets: Secondary | ICD-10-CM | POA: Insufficient documentation

## 2012-08-03 DIAGNOSIS — Z7982 Long term (current) use of aspirin: Secondary | ICD-10-CM | POA: Insufficient documentation

## 2012-08-03 DIAGNOSIS — I10 Essential (primary) hypertension: Secondary | ICD-10-CM | POA: Insufficient documentation

## 2012-08-03 DIAGNOSIS — F17200 Nicotine dependence, unspecified, uncomplicated: Secondary | ICD-10-CM | POA: Insufficient documentation

## 2012-08-03 DIAGNOSIS — Z5189 Encounter for other specified aftercare: Secondary | ICD-10-CM | POA: Insufficient documentation

## 2012-08-03 DIAGNOSIS — E785 Hyperlipidemia, unspecified: Secondary | ICD-10-CM | POA: Insufficient documentation

## 2012-08-03 DIAGNOSIS — Z9861 Coronary angioplasty status: Secondary | ICD-10-CM | POA: Insufficient documentation

## 2012-08-03 DIAGNOSIS — K219 Gastro-esophageal reflux disease without esophagitis: Secondary | ICD-10-CM | POA: Insufficient documentation

## 2012-08-03 DIAGNOSIS — J449 Chronic obstructive pulmonary disease, unspecified: Secondary | ICD-10-CM | POA: Insufficient documentation

## 2012-08-03 DIAGNOSIS — J4489 Other specified chronic obstructive pulmonary disease: Secondary | ICD-10-CM | POA: Insufficient documentation

## 2012-08-03 DIAGNOSIS — Z8673 Personal history of transient ischemic attack (TIA), and cerebral infarction without residual deficits: Secondary | ICD-10-CM | POA: Insufficient documentation

## 2012-08-03 DIAGNOSIS — I2510 Atherosclerotic heart disease of native coronary artery without angina pectoris: Secondary | ICD-10-CM | POA: Insufficient documentation

## 2012-08-04 ENCOUNTER — Encounter (HOSPITAL_COMMUNITY)
Admission: RE | Admit: 2012-08-04 | Discharge: 2012-08-04 | Disposition: A | Discharge status: Home / Self Care | Payer: Self-pay | Source: Ambulatory Visit | Attending: Cardiology | Admitting: Cardiology

## 2012-08-06 ENCOUNTER — Encounter (HOSPITAL_COMMUNITY)
Admission: RE | Admit: 2012-08-06 | Discharge: 2012-08-06 | Disposition: A | Discharge status: Home / Self Care | Payer: Self-pay | Source: Ambulatory Visit | Attending: Cardiology | Admitting: Cardiology

## 2012-08-10 ENCOUNTER — Encounter (HOSPITAL_COMMUNITY)
Admission: RE | Admit: 2012-08-10 | Discharge: 2012-08-10 | Disposition: A | Discharge status: Home / Self Care | Payer: Self-pay | Source: Ambulatory Visit | Attending: Cardiology | Admitting: Cardiology

## 2012-08-11 ENCOUNTER — Encounter (HOSPITAL_COMMUNITY)
Admission: RE | Admit: 2012-08-11 | Discharge: 2012-08-11 | Disposition: A | Discharge status: Home / Self Care | Payer: Self-pay | Source: Ambulatory Visit | Attending: Cardiology | Admitting: Cardiology

## 2012-08-13 ENCOUNTER — Encounter (HOSPITAL_COMMUNITY): Payer: Self-pay

## 2012-08-17 ENCOUNTER — Encounter (HOSPITAL_COMMUNITY)
Admission: RE | Admit: 2012-08-17 | Discharge: 2012-08-17 | Disposition: A | Discharge status: Home / Self Care | Payer: Self-pay | Source: Ambulatory Visit | Attending: Cardiology | Admitting: Cardiology

## 2012-08-18 ENCOUNTER — Encounter (HOSPITAL_COMMUNITY): Payer: Self-pay

## 2012-08-20 ENCOUNTER — Encounter (HOSPITAL_COMMUNITY): Payer: Self-pay

## 2012-08-24 ENCOUNTER — Encounter (HOSPITAL_COMMUNITY): Payer: Self-pay

## 2012-08-25 ENCOUNTER — Encounter (HOSPITAL_COMMUNITY): Payer: Self-pay

## 2012-08-27 ENCOUNTER — Encounter (HOSPITAL_COMMUNITY): Payer: Self-pay

## 2012-08-31 ENCOUNTER — Encounter (HOSPITAL_COMMUNITY): Payer: Self-pay

## 2012-09-01 ENCOUNTER — Encounter (HOSPITAL_COMMUNITY): Payer: Self-pay

## 2012-09-03 ENCOUNTER — Encounter (HOSPITAL_COMMUNITY)
Admission: RE | Admit: 2012-09-03 | Discharge: 2012-09-03 | Disposition: A | Discharge status: Home / Self Care | Payer: Self-pay | Source: Ambulatory Visit | Attending: Cardiology | Admitting: Cardiology

## 2012-09-03 DIAGNOSIS — J449 Chronic obstructive pulmonary disease, unspecified: Secondary | ICD-10-CM | POA: Insufficient documentation

## 2012-09-03 DIAGNOSIS — Z7982 Long term (current) use of aspirin: Secondary | ICD-10-CM | POA: Insufficient documentation

## 2012-09-03 DIAGNOSIS — I10 Essential (primary) hypertension: Secondary | ICD-10-CM | POA: Insufficient documentation

## 2012-09-03 DIAGNOSIS — Z7902 Long term (current) use of antithrombotics/antiplatelets: Secondary | ICD-10-CM | POA: Insufficient documentation

## 2012-09-03 DIAGNOSIS — Z5189 Encounter for other specified aftercare: Secondary | ICD-10-CM | POA: Insufficient documentation

## 2012-09-03 DIAGNOSIS — J4489 Other specified chronic obstructive pulmonary disease: Secondary | ICD-10-CM | POA: Insufficient documentation

## 2012-09-03 DIAGNOSIS — I2510 Atherosclerotic heart disease of native coronary artery without angina pectoris: Secondary | ICD-10-CM | POA: Insufficient documentation

## 2012-09-03 DIAGNOSIS — F17200 Nicotine dependence, unspecified, uncomplicated: Secondary | ICD-10-CM | POA: Insufficient documentation

## 2012-09-03 DIAGNOSIS — K219 Gastro-esophageal reflux disease without esophagitis: Secondary | ICD-10-CM | POA: Insufficient documentation

## 2012-09-03 DIAGNOSIS — Z8673 Personal history of transient ischemic attack (TIA), and cerebral infarction without residual deficits: Secondary | ICD-10-CM | POA: Insufficient documentation

## 2012-09-03 DIAGNOSIS — Z9861 Coronary angioplasty status: Secondary | ICD-10-CM | POA: Insufficient documentation

## 2012-09-03 DIAGNOSIS — E785 Hyperlipidemia, unspecified: Secondary | ICD-10-CM | POA: Insufficient documentation

## 2012-09-07 ENCOUNTER — Encounter (HOSPITAL_COMMUNITY): Payer: Self-pay

## 2012-09-08 ENCOUNTER — Encounter (HOSPITAL_COMMUNITY): Payer: Self-pay

## 2012-09-10 ENCOUNTER — Encounter (HOSPITAL_COMMUNITY): Payer: Self-pay

## 2012-09-14 ENCOUNTER — Encounter (HOSPITAL_COMMUNITY): Payer: Self-pay

## 2012-09-15 ENCOUNTER — Encounter (HOSPITAL_COMMUNITY): Payer: Self-pay

## 2012-09-17 ENCOUNTER — Encounter (HOSPITAL_COMMUNITY)
Admission: RE | Admit: 2012-09-17 | Discharge: 2012-09-17 | Disposition: A | Discharge status: Home / Self Care | Payer: Self-pay | Source: Ambulatory Visit | Attending: Cardiology | Admitting: Cardiology

## 2012-09-21 ENCOUNTER — Encounter (HOSPITAL_COMMUNITY)
Admission: RE | Admit: 2012-09-21 | Discharge: 2012-09-21 | Disposition: A | Discharge status: Home / Self Care | Payer: Self-pay | Source: Ambulatory Visit | Attending: Cardiology | Admitting: Cardiology

## 2012-09-22 ENCOUNTER — Encounter (HOSPITAL_COMMUNITY)
Admission: RE | Admit: 2012-09-22 | Discharge: 2012-09-22 | Disposition: A | Discharge status: Home / Self Care | Payer: Self-pay | Source: Ambulatory Visit | Attending: Cardiology | Admitting: Cardiology

## 2012-09-24 ENCOUNTER — Encounter (HOSPITAL_COMMUNITY)
Admission: RE | Admit: 2012-09-24 | Discharge: 2012-09-24 | Disposition: A | Discharge status: Home / Self Care | Payer: Self-pay | Source: Ambulatory Visit | Attending: Cardiology | Admitting: Cardiology

## 2012-09-28 ENCOUNTER — Encounter (HOSPITAL_COMMUNITY)
Admission: RE | Admit: 2012-09-28 | Discharge: 2012-09-28 | Disposition: A | Discharge status: Home / Self Care | Payer: Self-pay | Source: Ambulatory Visit | Attending: Cardiology | Admitting: Cardiology

## 2012-09-29 ENCOUNTER — Encounter (HOSPITAL_COMMUNITY)
Admission: RE | Admit: 2012-09-29 | Discharge: 2012-09-29 | Disposition: A | Discharge status: Home / Self Care | Payer: Self-pay | Source: Ambulatory Visit | Attending: Cardiology | Admitting: Cardiology

## 2012-10-01 ENCOUNTER — Encounter (HOSPITAL_COMMUNITY): Payer: Self-pay

## 2012-10-05 ENCOUNTER — Encounter (HOSPITAL_COMMUNITY)
Admission: RE | Admit: 2012-10-05 | Discharge: 2012-10-05 | Disposition: A | Discharge status: Home / Self Care | Payer: Self-pay | Source: Ambulatory Visit | Attending: Cardiology | Admitting: Cardiology

## 2012-10-05 DIAGNOSIS — E785 Hyperlipidemia, unspecified: Secondary | ICD-10-CM | POA: Insufficient documentation

## 2012-10-05 DIAGNOSIS — Z7982 Long term (current) use of aspirin: Secondary | ICD-10-CM | POA: Insufficient documentation

## 2012-10-05 DIAGNOSIS — Z8673 Personal history of transient ischemic attack (TIA), and cerebral infarction without residual deficits: Secondary | ICD-10-CM | POA: Insufficient documentation

## 2012-10-05 DIAGNOSIS — J4489 Other specified chronic obstructive pulmonary disease: Secondary | ICD-10-CM | POA: Insufficient documentation

## 2012-10-05 DIAGNOSIS — K219 Gastro-esophageal reflux disease without esophagitis: Secondary | ICD-10-CM | POA: Insufficient documentation

## 2012-10-05 DIAGNOSIS — I2510 Atherosclerotic heart disease of native coronary artery without angina pectoris: Secondary | ICD-10-CM | POA: Insufficient documentation

## 2012-10-05 DIAGNOSIS — Z5189 Encounter for other specified aftercare: Secondary | ICD-10-CM | POA: Insufficient documentation

## 2012-10-05 DIAGNOSIS — Z7902 Long term (current) use of antithrombotics/antiplatelets: Secondary | ICD-10-CM | POA: Insufficient documentation

## 2012-10-05 DIAGNOSIS — Z9861 Coronary angioplasty status: Secondary | ICD-10-CM | POA: Insufficient documentation

## 2012-10-05 DIAGNOSIS — F17200 Nicotine dependence, unspecified, uncomplicated: Secondary | ICD-10-CM | POA: Insufficient documentation

## 2012-10-05 DIAGNOSIS — J449 Chronic obstructive pulmonary disease, unspecified: Secondary | ICD-10-CM | POA: Insufficient documentation

## 2012-10-05 DIAGNOSIS — I10 Essential (primary) hypertension: Secondary | ICD-10-CM | POA: Insufficient documentation

## 2012-10-06 ENCOUNTER — Encounter (HOSPITAL_COMMUNITY)
Admission: RE | Admit: 2012-10-06 | Discharge: 2012-10-06 | Disposition: A | Discharge status: Home / Self Care | Payer: Self-pay | Source: Ambulatory Visit | Attending: Cardiology | Admitting: Cardiology

## 2012-10-08 ENCOUNTER — Encounter (HOSPITAL_COMMUNITY)
Admission: RE | Admit: 2012-10-08 | Discharge: 2012-10-08 | Disposition: A | Discharge status: Home / Self Care | Payer: Self-pay | Source: Ambulatory Visit | Attending: Cardiology | Admitting: Cardiology

## 2012-10-12 ENCOUNTER — Encounter (HOSPITAL_COMMUNITY)
Admission: RE | Admit: 2012-10-12 | Discharge: 2012-10-12 | Disposition: A | Discharge status: Home / Self Care | Payer: Self-pay | Source: Ambulatory Visit | Attending: Cardiology | Admitting: Cardiology

## 2012-10-13 ENCOUNTER — Encounter (HOSPITAL_COMMUNITY)
Admission: RE | Admit: 2012-10-13 | Discharge: 2012-10-13 | Disposition: A | Discharge status: Home / Self Care | Payer: Self-pay | Source: Ambulatory Visit | Attending: Cardiology | Admitting: Cardiology

## 2012-10-15 ENCOUNTER — Encounter (HOSPITAL_COMMUNITY): Payer: Self-pay

## 2012-10-19 ENCOUNTER — Encounter (HOSPITAL_COMMUNITY)
Admission: RE | Admit: 2012-10-19 | Discharge: 2012-10-19 | Disposition: A | Discharge status: Home / Self Care | Payer: Self-pay | Source: Ambulatory Visit | Attending: Cardiology | Admitting: Cardiology

## 2012-10-20 ENCOUNTER — Encounter (HOSPITAL_COMMUNITY)
Admission: RE | Admit: 2012-10-20 | Discharge: 2012-10-20 | Disposition: A | Discharge status: Home / Self Care | Payer: Self-pay | Source: Ambulatory Visit | Attending: Cardiology | Admitting: Cardiology

## 2012-10-22 ENCOUNTER — Encounter (HOSPITAL_COMMUNITY)
Admission: RE | Admit: 2012-10-22 | Discharge: 2012-10-22 | Disposition: A | Discharge status: Home / Self Care | Payer: Self-pay | Source: Ambulatory Visit | Attending: Cardiology | Admitting: Cardiology

## 2012-10-26 ENCOUNTER — Encounter (HOSPITAL_COMMUNITY)
Admission: RE | Admit: 2012-10-26 | Discharge: 2012-10-26 | Disposition: A | Discharge status: Home / Self Care | Payer: Self-pay | Source: Ambulatory Visit | Attending: Cardiology | Admitting: Cardiology

## 2012-10-27 ENCOUNTER — Encounter (HOSPITAL_COMMUNITY)
Admission: RE | Admit: 2012-10-27 | Discharge: 2012-10-27 | Disposition: A | Discharge status: Home / Self Care | Payer: Self-pay | Source: Ambulatory Visit | Attending: Cardiology | Admitting: Cardiology

## 2012-10-29 ENCOUNTER — Encounter (HOSPITAL_COMMUNITY)
Admission: RE | Admit: 2012-10-29 | Discharge: 2012-10-29 | Disposition: A | Discharge status: Home / Self Care | Payer: Self-pay | Source: Ambulatory Visit | Attending: Cardiology | Admitting: Cardiology

## 2012-11-02 ENCOUNTER — Encounter (HOSPITAL_COMMUNITY): Payer: Self-pay

## 2012-11-02 DIAGNOSIS — Z8673 Personal history of transient ischemic attack (TIA), and cerebral infarction without residual deficits: Secondary | ICD-10-CM | POA: Insufficient documentation

## 2012-11-02 DIAGNOSIS — Z9861 Coronary angioplasty status: Secondary | ICD-10-CM | POA: Insufficient documentation

## 2012-11-02 DIAGNOSIS — Z5189 Encounter for other specified aftercare: Secondary | ICD-10-CM | POA: Insufficient documentation

## 2012-11-02 DIAGNOSIS — Z7902 Long term (current) use of antithrombotics/antiplatelets: Secondary | ICD-10-CM | POA: Insufficient documentation

## 2012-11-02 DIAGNOSIS — J4489 Other specified chronic obstructive pulmonary disease: Secondary | ICD-10-CM | POA: Insufficient documentation

## 2012-11-02 DIAGNOSIS — I10 Essential (primary) hypertension: Secondary | ICD-10-CM | POA: Insufficient documentation

## 2012-11-02 DIAGNOSIS — Z7982 Long term (current) use of aspirin: Secondary | ICD-10-CM | POA: Insufficient documentation

## 2012-11-02 DIAGNOSIS — E785 Hyperlipidemia, unspecified: Secondary | ICD-10-CM | POA: Insufficient documentation

## 2012-11-02 DIAGNOSIS — F17200 Nicotine dependence, unspecified, uncomplicated: Secondary | ICD-10-CM | POA: Insufficient documentation

## 2012-11-02 DIAGNOSIS — K219 Gastro-esophageal reflux disease without esophagitis: Secondary | ICD-10-CM | POA: Insufficient documentation

## 2012-11-02 DIAGNOSIS — I2510 Atherosclerotic heart disease of native coronary artery without angina pectoris: Secondary | ICD-10-CM | POA: Insufficient documentation

## 2012-11-03 ENCOUNTER — Encounter (HOSPITAL_COMMUNITY): Payer: Self-pay

## 2012-11-05 ENCOUNTER — Encounter (HOSPITAL_COMMUNITY): Payer: Self-pay

## 2012-11-09 ENCOUNTER — Encounter (HOSPITAL_COMMUNITY)
Admission: RE | Admit: 2012-11-09 | Discharge: 2012-11-09 | Disposition: A | Discharge status: Home / Self Care | Payer: Self-pay | Source: Ambulatory Visit | Attending: Cardiology | Admitting: Cardiology

## 2012-11-10 ENCOUNTER — Encounter (HOSPITAL_COMMUNITY)
Admission: RE | Admit: 2012-11-10 | Discharge: 2012-11-10 | Disposition: A | Discharge status: Home / Self Care | Payer: Self-pay | Source: Ambulatory Visit | Attending: Cardiology | Admitting: Cardiology

## 2012-11-12 ENCOUNTER — Encounter (HOSPITAL_COMMUNITY)
Admission: RE | Admit: 2012-11-12 | Discharge: 2012-11-12 | Disposition: A | Discharge status: Home / Self Care | Payer: Self-pay | Source: Ambulatory Visit | Attending: Cardiology | Admitting: Cardiology

## 2012-11-16 ENCOUNTER — Encounter (HOSPITAL_COMMUNITY): Payer: Self-pay

## 2012-11-17 ENCOUNTER — Encounter (HOSPITAL_COMMUNITY): Payer: Self-pay

## 2012-11-18 ENCOUNTER — Ambulatory Visit: Payer: Medicare Other | Admitting: Pulmonary Disease

## 2012-11-19 ENCOUNTER — Encounter (HOSPITAL_COMMUNITY)
Admission: RE | Admit: 2012-11-19 | Discharge: 2012-11-19 | Disposition: A | Discharge status: Home / Self Care | Payer: Self-pay | Source: Ambulatory Visit | Attending: Cardiology | Admitting: Cardiology

## 2012-11-20 ENCOUNTER — Encounter: Payer: Self-pay | Admitting: Pulmonary Disease

## 2012-11-20 ENCOUNTER — Ambulatory Visit (INDEPENDENT_AMBULATORY_CARE_PROVIDER_SITE_OTHER): Payer: Medicare Other | Admitting: Pulmonary Disease

## 2012-11-20 VITALS — BP 126/78 | HR 79 | Temp 97.0°F | Ht 66.0 in | Wt 166.0 lb

## 2012-11-20 DIAGNOSIS — J449 Chronic obstructive pulmonary disease, unspecified: Secondary | ICD-10-CM

## 2012-11-20 DIAGNOSIS — J4489 Other specified chronic obstructive pulmonary disease: Secondary | ICD-10-CM

## 2012-11-20 NOTE — Assessment & Plan Note (Signed)
The patient feels that he is near his usual baseline, but unfortunately continues to smoke.  He is now in some type of drug study that is looking at different types of bronchodilators.  I've asked him to be very careful with this since he has fairly significant obstructive lung disease, and would be at risk for acute exacerbation.  Finally, I stressed to him again that smoking cessation is his most important treatment for his underlying lung disease.

## 2012-11-20 NOTE — Patient Instructions (Addendum)
Will try and determine which medications you are currently taking for your copd Work as hard as you can on smoking cessation.   followup with me in 6mos if doing well, but call if having increased breathing issues.

## 2012-11-20 NOTE — Progress Notes (Signed)
  Subjective:    Patient ID: Brad Turner, male    DOB: 1941/01/21, 72 y.o.   MRN: [id number]  HPI Patient comes in today for followup of his known moderate COPD.  Overall he feels that he is near his usual baseline, but most recently he is not breathing quite as well as he has.  He is now in a drug study for a new LABA/LAMA combination, and he is unsure whether he may be getting placebo.  He denies any significant worsening of his chest congestion or cough.   Review of Systems  Constitutional: Negative for fever and unexpected weight change.  HENT: Positive for congestion and sneezing. Negative for ear pain, nosebleeds, sore throat, rhinorrhea, trouble swallowing, dental problem, postnasal drip and sinus pressure.   Eyes: Negative for redness and itching.  Respiratory: Negative for cough, chest tightness, shortness of breath and wheezing.   Cardiovascular: Positive for leg swelling ( ankles). Negative for palpitations.  Gastrointestinal: Negative for nausea and vomiting.  Genitourinary: Positive for urgency and frequency. Negative for dysuria.  Musculoskeletal: Negative for joint swelling.  Skin: Negative for rash.  Neurological: Negative for headaches.  Hematological: Does not bruise/bleed easily.  Psychiatric/Behavioral: Negative for dysphoric mood. The patient is not nervous/anxious.        Objective:   Physical Exam Wd male in nad Nose without purulence or d/c noted. Neck without LN or TMG Chest with decreased bs, a few basilar crackles, no wheezing Cor with rrr LE with mild edema, no cyanosis Alert and oriented, moves all 4.        Assessment & Plan:

## 2012-11-23 ENCOUNTER — Encounter (HOSPITAL_COMMUNITY)
Admission: RE | Admit: 2012-11-23 | Discharge: 2012-11-23 | Disposition: A | Discharge status: Home / Self Care | Payer: Self-pay | Source: Ambulatory Visit | Attending: Cardiology | Admitting: Cardiology

## 2012-11-24 ENCOUNTER — Encounter (HOSPITAL_COMMUNITY)
Admission: RE | Admit: 2012-11-24 | Discharge: 2012-11-24 | Disposition: A | Discharge status: Home / Self Care | Payer: Self-pay | Source: Ambulatory Visit | Attending: Cardiology | Admitting: Cardiology

## 2012-11-26 ENCOUNTER — Encounter (HOSPITAL_COMMUNITY)
Admission: RE | Admit: 2012-11-26 | Discharge: 2012-11-26 | Disposition: A | Discharge status: Home / Self Care | Payer: Self-pay | Source: Ambulatory Visit | Attending: Cardiology | Admitting: Cardiology

## 2012-11-30 ENCOUNTER — Encounter (HOSPITAL_COMMUNITY): Payer: Self-pay

## 2012-12-01 ENCOUNTER — Encounter (HOSPITAL_COMMUNITY)
Admission: RE | Admit: 2012-12-01 | Discharge: 2012-12-01 | Disposition: A | Discharge status: Home / Self Care | Payer: Self-pay | Source: Ambulatory Visit | Attending: Cardiology | Admitting: Cardiology

## 2012-12-01 DIAGNOSIS — F17200 Nicotine dependence, unspecified, uncomplicated: Secondary | ICD-10-CM | POA: Insufficient documentation

## 2012-12-01 DIAGNOSIS — J449 Chronic obstructive pulmonary disease, unspecified: Secondary | ICD-10-CM | POA: Insufficient documentation

## 2012-12-01 DIAGNOSIS — Z7982 Long term (current) use of aspirin: Secondary | ICD-10-CM | POA: Insufficient documentation

## 2012-12-01 DIAGNOSIS — Z7902 Long term (current) use of antithrombotics/antiplatelets: Secondary | ICD-10-CM | POA: Insufficient documentation

## 2012-12-01 DIAGNOSIS — J4489 Other specified chronic obstructive pulmonary disease: Secondary | ICD-10-CM | POA: Insufficient documentation

## 2012-12-01 DIAGNOSIS — K219 Gastro-esophageal reflux disease without esophagitis: Secondary | ICD-10-CM | POA: Insufficient documentation

## 2012-12-01 DIAGNOSIS — Z8673 Personal history of transient ischemic attack (TIA), and cerebral infarction without residual deficits: Secondary | ICD-10-CM | POA: Insufficient documentation

## 2012-12-01 DIAGNOSIS — I2510 Atherosclerotic heart disease of native coronary artery without angina pectoris: Secondary | ICD-10-CM | POA: Insufficient documentation

## 2012-12-01 DIAGNOSIS — Z5189 Encounter for other specified aftercare: Secondary | ICD-10-CM | POA: Insufficient documentation

## 2012-12-01 DIAGNOSIS — I10 Essential (primary) hypertension: Secondary | ICD-10-CM | POA: Insufficient documentation

## 2012-12-01 DIAGNOSIS — Z9861 Coronary angioplasty status: Secondary | ICD-10-CM | POA: Insufficient documentation

## 2012-12-01 DIAGNOSIS — E785 Hyperlipidemia, unspecified: Secondary | ICD-10-CM | POA: Insufficient documentation

## 2012-12-03 ENCOUNTER — Encounter (HOSPITAL_COMMUNITY)
Admission: RE | Admit: 2012-12-03 | Discharge: 2012-12-03 | Disposition: A | Discharge status: Home / Self Care | Payer: Self-pay | Source: Ambulatory Visit | Attending: Cardiology | Admitting: Cardiology

## 2012-12-07 ENCOUNTER — Encounter (HOSPITAL_COMMUNITY)
Admission: RE | Admit: 2012-12-07 | Discharge: 2012-12-07 | Disposition: A | Discharge status: Home / Self Care | Payer: Self-pay | Source: Ambulatory Visit | Attending: Cardiology | Admitting: Cardiology

## 2012-12-08 ENCOUNTER — Encounter (HOSPITAL_COMMUNITY)
Admission: RE | Admit: 2012-12-08 | Discharge: 2012-12-08 | Disposition: A | Discharge status: Home / Self Care | Payer: Self-pay | Source: Ambulatory Visit | Attending: Cardiology | Admitting: Cardiology

## 2012-12-10 ENCOUNTER — Encounter (HOSPITAL_COMMUNITY)
Admission: RE | Admit: 2012-12-10 | Discharge: 2012-12-10 | Disposition: A | Discharge status: Home / Self Care | Payer: Self-pay | Source: Ambulatory Visit | Attending: Cardiology | Admitting: Cardiology

## 2012-12-14 ENCOUNTER — Encounter (HOSPITAL_COMMUNITY)
Admission: RE | Admit: 2012-12-14 | Discharge: 2012-12-14 | Disposition: A | Discharge status: Home / Self Care | Payer: Self-pay | Source: Ambulatory Visit | Attending: Cardiology | Admitting: Cardiology

## 2012-12-15 ENCOUNTER — Encounter (HOSPITAL_COMMUNITY)
Admission: RE | Admit: 2012-12-15 | Discharge: 2012-12-15 | Disposition: A | Discharge status: Home / Self Care | Payer: Self-pay | Source: Ambulatory Visit | Attending: Cardiology | Admitting: Cardiology

## 2012-12-16 ENCOUNTER — Encounter: Payer: Self-pay | Admitting: Gastroenterology

## 2012-12-16 ENCOUNTER — Ambulatory Visit (INDEPENDENT_AMBULATORY_CARE_PROVIDER_SITE_OTHER): Payer: Medicare Other | Admitting: Gastroenterology

## 2012-12-16 VITALS — BP 120/70 | HR 66 | Ht 66.0 in | Wt 165.0 lb

## 2012-12-16 DIAGNOSIS — Z86010 Personal history of colonic polyps: Secondary | ICD-10-CM

## 2012-12-16 DIAGNOSIS — K2270 Barrett's esophagus without dysplasia: Secondary | ICD-10-CM

## 2012-12-16 NOTE — Assessment & Plan Note (Signed)
Plan followup endoscopy. Patient was instructed to continue Plavix.

## 2012-12-16 NOTE — Progress Notes (Signed)
History of Present Illness: Patient has returned for followup of Barrett's esophagus. In 2009 biopsies of the GE junction demonstrated Barrett's esophagus. Repeat exam at 2010 was negative for Barrett's.  He has no GI complaints including pyrosis, abdominal pain or dysphagia. He is on Plavix because of a history of CVA. He has a history of adenomatous colon polyps 2002. Hyperplastic polyps only were seen in 2006 and 2011.    Past Medical History  Diagnosis Date  . CVA (cerebral infarction)   . Hyperlipidemia   . GERD (gastroesophageal reflux disease)   . CAD (coronary artery disease)   . Arthritis    Past Surgical History  Procedure Laterality Date  . Appendectomy    . Angioplasty      x4  . Tonsillectomy    . Rotator cuff repair      bilateral   family history includes Colon cancer in an unspecified family member; Heart disease in his father; Rheum arthritis in his mother; and Stroke in his mother. Current Outpatient Prescriptions  Medication Sig Dispense Refill  . albuterol (PROVENTIL HFA;VENTOLIN HFA) 108 (90 BASE) MCG/ACT inhaler Inhale 2 puffs into the lungs every 6 (six) hours as needed.        . AMBULATORY NON FORMULARY MEDICATION Study Drug  pulmonary      . amLODipine (NORVASC) 10 MG tablet Take 5 mg by mouth daily.       Marland Kitchen aspirin 81 MG tablet Take 81 mg by mouth daily.        Marland Kitchen atorvastatin (LIPITOR) 80 MG tablet Take 80 mg by mouth daily.        . budesonide (PULMICORT) 180 MCG/ACT inhaler Inhale 2 puffs into the lungs 2 (two) times daily.      . clopidogrel (PLAVIX) 75 MG tablet Take 75 mg by mouth daily.        . fenofibrate (TRICOR) 145 MG tablet Take 145 mg by mouth daily.        Marland Kitchen FOLIC ACID PO Once a [place]       . pantoprazole (PROTONIX) 40 MG tablet Take 40 mg by mouth daily.        . ramipril (ALTACE) 10 MG tablet Take 10 mg by mouth daily.        . tadalafil (CIALIS) 5 MG tablet Take 5 mg by mouth daily as needed.       No current facility-administered  medications for this visit.   Allergies as of 12/16/2012 - Review Complete 12/16/2012  Allergen Reaction Noted  . Ivp dye (iodinated diagnostic agents) Hives and Shortness Of Breath 11/07/2011  . Nitrofurantoin  04/28/2008    reports that he has been smoking Cigarettes.  He has a 56 pack-year smoking history. He has never used smokeless tobacco. He reports that  drinks alcohol. He reports that he does not use illicit drugs.     Review of Systems: Pertinent positive and negative review of systems were noted in the above HPI section. All other review of systems were otherwise negative.  Vital signs were reviewed in today's medical record Physical Exam: General: Well developed , well nourished, no acute distress Skin: anicteric Head: Normocephalic and atraumatic Eyes:  sclerae anicteric, EOMI Ears: Normal auditory acuity Mouth: No deformity or lesions Neck: Supple, no masses or thyromegaly Lungs: Clear throughout to auscultation Heart: Regular rate and rhythm; no murmurs, rubs or bruits Abdomen: Soft, non tender and non distended. No masses, hepatosplenomegaly or hernias noted. Normal Bowel sounds Rectal:deferred Musculoskeletal: Symmetrical with no  gross deformities  Skin: No lesions on visible extremities Pulses:  Normal pulses noted Extremities: No clubbing, cyanosis, edema or deformities noted Neurological: Alert oriented x 4, grossly nonfocal Cervical Nodes:  No significant cervical adenopathy Inguinal Nodes: No significant inguinal adenopathy Psychological:  Alert and cooperative. Normal mood and affect

## 2012-12-16 NOTE — Patient Instructions (Addendum)
You have been given a separate informational sheet regarding your tobacco use, the importance of quitting and local resources to help you quit.  You have been scheduled for an endoscopy with propofol. Please follow written instructions given to you at your visit today. If you use inhalers (even only as needed), please bring them with you on the [place] of your procedure.  Stay on plavix per Dr Arlyce Dice

## 2012-12-16 NOTE — Assessment & Plan Note (Signed)
Follow-up colonoscopy 2021 

## 2012-12-17 ENCOUNTER — Encounter (HOSPITAL_COMMUNITY): Payer: Self-pay

## 2012-12-21 ENCOUNTER — Encounter (HOSPITAL_COMMUNITY)
Admission: RE | Admit: 2012-12-21 | Discharge: 2012-12-21 | Disposition: A | Discharge status: Home / Self Care | Payer: Self-pay | Source: Ambulatory Visit | Attending: Cardiology | Admitting: Cardiology

## 2012-12-22 ENCOUNTER — Encounter (HOSPITAL_COMMUNITY)
Admission: RE | Admit: 2012-12-22 | Discharge: 2012-12-22 | Disposition: A | Discharge status: Home / Self Care | Payer: Self-pay | Source: Ambulatory Visit | Attending: Cardiology | Admitting: Cardiology

## 2012-12-24 ENCOUNTER — Encounter (HOSPITAL_COMMUNITY): Payer: Self-pay

## 2012-12-28 ENCOUNTER — Encounter (HOSPITAL_COMMUNITY): Payer: Self-pay

## 2012-12-29 ENCOUNTER — Encounter (HOSPITAL_COMMUNITY)
Admission: RE | Admit: 2012-12-29 | Discharge: 2012-12-29 | Disposition: A | Discharge status: Home / Self Care | Payer: Self-pay | Source: Ambulatory Visit | Attending: Cardiology | Admitting: Cardiology

## 2012-12-31 ENCOUNTER — Encounter (HOSPITAL_COMMUNITY)
Admission: RE | Admit: 2012-12-31 | Discharge: 2012-12-31 | Disposition: A | Discharge status: Home / Self Care | Payer: Self-pay | Source: Ambulatory Visit | Attending: Cardiology | Admitting: Cardiology

## 2012-12-31 DIAGNOSIS — K219 Gastro-esophageal reflux disease without esophagitis: Secondary | ICD-10-CM | POA: Insufficient documentation

## 2012-12-31 DIAGNOSIS — F17200 Nicotine dependence, unspecified, uncomplicated: Secondary | ICD-10-CM | POA: Insufficient documentation

## 2012-12-31 DIAGNOSIS — Z7982 Long term (current) use of aspirin: Secondary | ICD-10-CM | POA: Insufficient documentation

## 2012-12-31 DIAGNOSIS — Z5189 Encounter for other specified aftercare: Secondary | ICD-10-CM | POA: Insufficient documentation

## 2012-12-31 DIAGNOSIS — J449 Chronic obstructive pulmonary disease, unspecified: Secondary | ICD-10-CM | POA: Insufficient documentation

## 2012-12-31 DIAGNOSIS — J4489 Other specified chronic obstructive pulmonary disease: Secondary | ICD-10-CM | POA: Insufficient documentation

## 2012-12-31 DIAGNOSIS — Z9861 Coronary angioplasty status: Secondary | ICD-10-CM | POA: Insufficient documentation

## 2012-12-31 DIAGNOSIS — Z8673 Personal history of transient ischemic attack (TIA), and cerebral infarction without residual deficits: Secondary | ICD-10-CM | POA: Insufficient documentation

## 2012-12-31 DIAGNOSIS — I2510 Atherosclerotic heart disease of native coronary artery without angina pectoris: Secondary | ICD-10-CM | POA: Insufficient documentation

## 2012-12-31 DIAGNOSIS — I10 Essential (primary) hypertension: Secondary | ICD-10-CM | POA: Insufficient documentation

## 2012-12-31 DIAGNOSIS — E785 Hyperlipidemia, unspecified: Secondary | ICD-10-CM | POA: Insufficient documentation

## 2012-12-31 DIAGNOSIS — Z7902 Long term (current) use of antithrombotics/antiplatelets: Secondary | ICD-10-CM | POA: Insufficient documentation

## 2013-01-01 ENCOUNTER — Encounter: Payer: Self-pay | Admitting: Gastroenterology

## 2013-01-04 ENCOUNTER — Encounter (HOSPITAL_COMMUNITY)
Admission: RE | Admit: 2013-01-04 | Discharge: 2013-01-04 | Disposition: A | Discharge status: Home / Self Care | Payer: Self-pay | Source: Ambulatory Visit | Attending: Cardiology | Admitting: Cardiology

## 2013-01-04 ENCOUNTER — Telehealth: Payer: Self-pay | Admitting: *Deleted

## 2013-01-04 NOTE — Telephone Encounter (Signed)
Brad Turner,   Could you reschedule Brad Turner and Brad Turner to another date?

## 2013-01-05 ENCOUNTER — Encounter (HOSPITAL_COMMUNITY): Payer: Self-pay

## 2013-01-05 NOTE — Telephone Encounter (Signed)
Contacted pt and rescheduled his procedue till 5-15

## 2013-01-07 ENCOUNTER — Encounter (HOSPITAL_COMMUNITY)
Admission: RE | Admit: 2013-01-07 | Discharge: 2013-01-07 | Disposition: A | Discharge status: Home / Self Care | Payer: Self-pay | Source: Ambulatory Visit | Attending: Cardiology | Admitting: Cardiology

## 2013-01-11 ENCOUNTER — Encounter (HOSPITAL_COMMUNITY)
Admission: RE | Admit: 2013-01-11 | Discharge: 2013-01-11 | Disposition: A | Discharge status: Home / Self Care | Payer: Self-pay | Source: Ambulatory Visit | Attending: Cardiology | Admitting: Cardiology

## 2013-01-12 ENCOUNTER — Encounter (HOSPITAL_COMMUNITY)
Admission: RE | Admit: 2013-01-12 | Discharge: 2013-01-12 | Disposition: A | Discharge status: Home / Self Care | Payer: Self-pay | Source: Ambulatory Visit | Attending: Cardiology | Admitting: Cardiology

## 2013-01-14 ENCOUNTER — Encounter: Payer: Self-pay | Admitting: Gastroenterology

## 2013-01-14 ENCOUNTER — Encounter (HOSPITAL_COMMUNITY)
Admission: RE | Admit: 2013-01-14 | Discharge: 2013-01-14 | Disposition: A | Discharge status: Home / Self Care | Payer: Self-pay | Source: Ambulatory Visit | Attending: Cardiology | Admitting: Cardiology

## 2013-01-14 ENCOUNTER — Ambulatory Visit (AMBULATORY_SURGERY_CENTER): Payer: Medicare Other | Admitting: Gastroenterology

## 2013-01-14 VITALS — BP 174/91 | HR 78 | Temp 96.9°F | Resp 26 | Ht 66.0 in | Wt 165.0 lb

## 2013-01-14 DIAGNOSIS — K2270 Barrett's esophagus without dysplasia: Secondary | ICD-10-CM

## 2013-01-14 MED ORDER — SODIUM CHLORIDE 0.9 % IV SOLN: 500.0000 mL | INTRAVENOUS | Status: DC

## 2013-01-14 NOTE — Patient Instructions (Addendum)
YOU HAD AN ENDOSCOPIC PROCEDURE TODAY AT THE [facility]: Refer to the procedure report that was given to you for any specific questions about what was found during the examination.  If the procedure report does not answer your questions, please call your gastroenterologist to clarify.  If you requested that your care partner not be given the details of your procedure findings, then the procedure report has been included in a sealed envelope for you to review at your convenience later.  YOU SHOULD EXPECT: Some feelings of bloating in the abdomen. Passage of more gas than usual.  Walking can help get rid of the air that was put into your GI tract during the procedure and reduce the bloating. If you had a lower endoscopy (such as a colonoscopy or flexible sigmoidoscopy) you may notice spotting of blood in your stool or on the toilet paper. If you underwent a bowel prep for your procedure, then you may not have a normal bowel movement for a few days.  DIET: Your first meal following the procedure should be a light meal and then it is ok to progress to your normal diet.  A half-sandwich or bowl of soup is an example of a good first meal.  Heavy or fried foods are harder to digest and may make you feel nauseous or bloated.  Likewise meals heavy in dairy and vegetables can cause extra gas to form and this can also increase the bloating.  Drink plenty of fluids but you should avoid alcoholic beverages for 24 hours.  ACTIVITY: Your care partner should take you home directly after the procedure.  You should plan to take it easy, moving slowly for the rest of the [place].  You can resume normal activity the [place] after the procedure however you should NOT DRIVE or use heavy machinery for 24 hours (because of the sedation medicines used during the test).    SYMPTOMS TO REPORT IMMEDIATELY: A gastroenterologist can be reached at any hour.  During normal business hours, 8:30 AM to 5:00 PM Monday through Friday,  call [phone].  After hours and on weekends, please call the GI answering service at [phone] who will take a message and have the physician on call contact you.    Following upper endoscopy (EGD)  Vomiting of blood or coffee ground material  New chest pain or pain under the shoulder blades  Painful or persistently difficult swallowing  New shortness of breath  Fever of 100F or higher  Black, tarry-looking stools  FOLLOW UP: If any biopsies were taken you will be contacted by phone or by letter within the next 1-3 weeks.  Call your gastroenterologist if you have not heard about the biopsies in 3 weeks.  Our staff will call the home number listed on your records the next business [place] following your procedure to check on you and address any questions or concerns that you may have at that time regarding the information given to you following your procedure. This is a courtesy call and so if there is no answer at the home number and we have not heard from you through the emergency physician on call, we will assume that you have returned to your regular daily activities without incident.  SIGNATURES/CONFIDENTIALITY: You and/or your care partner have signed paperwork which will be entered into your electronic medical record.  These signatures attest to the fact that that the information above on your After Visit Summary has been reviewed and is understood.  Full   responsibility of the confidentiality of this discharge information lies with you and/or your care-partner.   Barrett's esophagus information given.     Dr. Arlyce Dice will advise about your next colonoscopy.

## 2013-01-14 NOTE — Progress Notes (Signed)
Patient did not experience any of the following events: a burn prior to discharge; a fall within the facility; wrong site/side/patient/procedure/implant event; or a hospital transfer or hospital admission upon discharge from the facility. (G8907) Patient did not have preoperative order for IV antibiotic SSI prophylaxis. (G8918)  

## 2013-01-14 NOTE — Op Note (Signed)
[facility] [address] [place], [address]   ENDOSCOPY PROCEDURE REPORT  PATIENT: Brad Turner, Brad Turner  MR#: [id number] BIRTHDATE: 1940-09-20 , 71  yrs. old GENDER: Male ENDOSCOPIST: Louis Meckel, MD REFERRED BY:  Guerry Bruin, M.D. PROCEDURE DATE:  01/14/2013 PROCEDURE:  EGD w/ biopsy ASA CLASS:     Class II INDICATIONS:  Surveillance.  history of Barrett's esophagus MEDICATIONS: MAC sedation, administered by CRNA, propofol (Diprivan) 150mg  IV, and Simethicone 0.6cc PO TOPICAL ANESTHETIC:  DESCRIPTION OF PROCEDURE: After the risks benefits and alternatives of the procedure were thoroughly explained, informed consent was obtained.  The LB [id number] endoscope was introduced through the mouth and advanced to the third portion of the duodenum. Without limitations.  The instrument was slowly withdrawn as the mucosa was fully examined.      Again noted is an irregular GE junction.  The Z line is approximately 36-37 cm.  There are islands of gastric appearing mucosa.  Multiple biopsies were taken.   Again noted is an irregular GE junction.  The Z line is approximately 36-37 cm. There are islands of gastric appearing mucosa.  Multiple biopsies were taken.   The remainder of the upper endoscopy exam was otherwise normal.  Retroflexed views revealed no abnormalities. The scope was then withdrawn from the patient and the procedure completed.  COMPLICATIONS: There were no complications. ENDOSCOPIC IMPRESSION: 1.  Barrett's esophagus  RECOMMENDATIONS: await biopsy report REPEAT EXAM:  eSigned:  Louis Meckel, MD 01/14/2013 2:09 PM   CC:

## 2013-01-15 ENCOUNTER — Telehealth: Payer: Self-pay | Admitting: *Deleted

## 2013-01-15 ENCOUNTER — Encounter: Payer: Medicare Other | Admitting: Gastroenterology

## 2013-01-15 NOTE — Telephone Encounter (Signed)
  Follow up Call-  Call back number 01/14/2013  Post procedure Call Back phone  # [phone]  Permission to leave phone message Yes     Patient questions:  Do you have a fever, pain , or abdominal swelling? no Pain Score  0 *  Have you tolerated food without any problems? yes  Have you been able to return to your normal activities? yes  Do you have any questions about your discharge instructions: Diet   no Medications  no Follow up visit  no  Do you have questions or concerns about your Care? no  Actions: * If pain score is 4 or above: No action needed, pain <4.

## 2013-01-18 ENCOUNTER — Encounter (HOSPITAL_COMMUNITY): Payer: Self-pay

## 2013-01-19 ENCOUNTER — Encounter (HOSPITAL_COMMUNITY): Payer: Self-pay

## 2013-01-21 ENCOUNTER — Encounter (HOSPITAL_COMMUNITY): Payer: Self-pay

## 2013-01-26 ENCOUNTER — Encounter (HOSPITAL_COMMUNITY): Payer: Self-pay

## 2013-01-26 ENCOUNTER — Encounter: Payer: Self-pay | Admitting: Gastroenterology

## 2013-01-28 ENCOUNTER — Encounter (HOSPITAL_COMMUNITY): Payer: Self-pay

## 2013-02-01 ENCOUNTER — Encounter (HOSPITAL_COMMUNITY)
Admission: RE | Admit: 2013-02-01 | Discharge: 2013-02-01 | Disposition: A | Discharge status: Home / Self Care | Payer: Self-pay | Source: Ambulatory Visit | Attending: Cardiology | Admitting: Cardiology

## 2013-02-01 DIAGNOSIS — I10 Essential (primary) hypertension: Secondary | ICD-10-CM | POA: Insufficient documentation

## 2013-02-01 DIAGNOSIS — Z7982 Long term (current) use of aspirin: Secondary | ICD-10-CM | POA: Insufficient documentation

## 2013-02-01 DIAGNOSIS — I2510 Atherosclerotic heart disease of native coronary artery without angina pectoris: Secondary | ICD-10-CM | POA: Insufficient documentation

## 2013-02-01 DIAGNOSIS — Z7902 Long term (current) use of antithrombotics/antiplatelets: Secondary | ICD-10-CM | POA: Insufficient documentation

## 2013-02-01 DIAGNOSIS — E785 Hyperlipidemia, unspecified: Secondary | ICD-10-CM | POA: Insufficient documentation

## 2013-02-01 DIAGNOSIS — F17200 Nicotine dependence, unspecified, uncomplicated: Secondary | ICD-10-CM | POA: Insufficient documentation

## 2013-02-01 DIAGNOSIS — Z5189 Encounter for other specified aftercare: Secondary | ICD-10-CM | POA: Insufficient documentation

## 2013-02-01 DIAGNOSIS — Z9861 Coronary angioplasty status: Secondary | ICD-10-CM | POA: Insufficient documentation

## 2013-02-02 ENCOUNTER — Encounter (HOSPITAL_COMMUNITY)
Admission: RE | Admit: 2013-02-02 | Discharge: 2013-02-02 | Disposition: A | Discharge status: Home / Self Care | Payer: Self-pay | Source: Ambulatory Visit | Attending: Cardiology | Admitting: Cardiology

## 2013-02-04 ENCOUNTER — Encounter (HOSPITAL_COMMUNITY): Payer: Self-pay

## 2013-02-08 ENCOUNTER — Telehealth: Payer: Self-pay | Admitting: Pulmonary Disease

## 2013-02-08 ENCOUNTER — Other Ambulatory Visit: Payer: Self-pay | Admitting: Pulmonary Disease

## 2013-02-08 ENCOUNTER — Encounter (HOSPITAL_COMMUNITY): Payer: Self-pay

## 2013-02-08 MED ORDER — BUDESONIDE-FORMOTEROL FUMARATE 160-4.5 MCG/ACT IN AERO: 2.0000 | INHALATION_SPRAY | Freq: Two times a day (BID) | RESPIRATORY_TRACT | Status: DC

## 2013-02-08 NOTE — Telephone Encounter (Signed)
Spoke with pt requesting rx for symbicort 160/4.5 pt rx sent pt aware .

## 2013-02-09 ENCOUNTER — Encounter (HOSPITAL_COMMUNITY): Payer: Self-pay

## 2013-02-11 ENCOUNTER — Encounter (HOSPITAL_COMMUNITY)
Admission: RE | Admit: 2013-02-11 | Discharge: 2013-02-11 | Disposition: A | Discharge status: Home / Self Care | Payer: Self-pay | Source: Ambulatory Visit | Attending: Cardiology | Admitting: Cardiology

## 2013-02-15 ENCOUNTER — Encounter (HOSPITAL_COMMUNITY): Payer: Self-pay

## 2013-02-16 ENCOUNTER — Encounter (HOSPITAL_COMMUNITY): Payer: Self-pay

## 2013-02-18 ENCOUNTER — Encounter (HOSPITAL_COMMUNITY): Payer: Self-pay

## 2013-02-22 ENCOUNTER — Encounter (HOSPITAL_COMMUNITY)
Admission: RE | Admit: 2013-02-22 | Discharge: 2013-02-22 | Disposition: A | Discharge status: Home / Self Care | Payer: Self-pay | Source: Ambulatory Visit | Attending: Cardiology | Admitting: Cardiology

## 2013-02-23 ENCOUNTER — Encounter (HOSPITAL_COMMUNITY)
Admission: RE | Admit: 2013-02-23 | Discharge: 2013-02-23 | Disposition: A | Discharge status: Home / Self Care | Payer: Self-pay | Source: Ambulatory Visit | Attending: Cardiology | Admitting: Cardiology

## 2013-02-25 ENCOUNTER — Encounter (HOSPITAL_COMMUNITY)
Admission: RE | Admit: 2013-02-25 | Discharge: 2013-02-25 | Disposition: A | Discharge status: Home / Self Care | Payer: Self-pay | Source: Ambulatory Visit | Attending: Cardiology | Admitting: Cardiology

## 2013-03-01 ENCOUNTER — Encounter (HOSPITAL_COMMUNITY)
Admission: RE | Admit: 2013-03-01 | Discharge: 2013-03-01 | Disposition: A | Discharge status: Home / Self Care | Payer: Self-pay | Source: Ambulatory Visit | Attending: Cardiology | Admitting: Cardiology

## 2013-03-02 ENCOUNTER — Encounter (HOSPITAL_COMMUNITY)
Admission: RE | Admit: 2013-03-02 | Discharge: 2013-03-02 | Disposition: A | Discharge status: Home / Self Care | Payer: Self-pay | Source: Ambulatory Visit | Attending: Cardiology | Admitting: Cardiology

## 2013-03-02 DIAGNOSIS — E785 Hyperlipidemia, unspecified: Secondary | ICD-10-CM | POA: Insufficient documentation

## 2013-03-02 DIAGNOSIS — Z5189 Encounter for other specified aftercare: Secondary | ICD-10-CM | POA: Insufficient documentation

## 2013-03-02 DIAGNOSIS — Z7902 Long term (current) use of antithrombotics/antiplatelets: Secondary | ICD-10-CM | POA: Insufficient documentation

## 2013-03-02 DIAGNOSIS — I10 Essential (primary) hypertension: Secondary | ICD-10-CM | POA: Insufficient documentation

## 2013-03-02 DIAGNOSIS — I2510 Atherosclerotic heart disease of native coronary artery without angina pectoris: Secondary | ICD-10-CM | POA: Insufficient documentation

## 2013-03-02 DIAGNOSIS — F17200 Nicotine dependence, unspecified, uncomplicated: Secondary | ICD-10-CM | POA: Insufficient documentation

## 2013-03-02 DIAGNOSIS — Z7982 Long term (current) use of aspirin: Secondary | ICD-10-CM | POA: Insufficient documentation

## 2013-03-02 DIAGNOSIS — Z9861 Coronary angioplasty status: Secondary | ICD-10-CM | POA: Insufficient documentation

## 2013-03-04 ENCOUNTER — Encounter (HOSPITAL_COMMUNITY)
Admission: RE | Admit: 2013-03-04 | Discharge: 2013-03-04 | Disposition: A | Discharge status: Home / Self Care | Payer: Self-pay | Source: Ambulatory Visit | Attending: Cardiology | Admitting: Cardiology

## 2013-03-08 ENCOUNTER — Encounter (HOSPITAL_COMMUNITY)
Admission: RE | Admit: 2013-03-08 | Discharge: 2013-03-08 | Disposition: A | Discharge status: Home / Self Care | Payer: Self-pay | Source: Ambulatory Visit | Attending: Cardiology | Admitting: Cardiology

## 2013-03-09 ENCOUNTER — Encounter (HOSPITAL_COMMUNITY)
Admission: RE | Admit: 2013-03-09 | Discharge: 2013-03-09 | Disposition: A | Discharge status: Home / Self Care | Payer: Self-pay | Source: Ambulatory Visit | Attending: Cardiology | Admitting: Cardiology

## 2013-03-11 ENCOUNTER — Encounter (HOSPITAL_COMMUNITY)
Admission: RE | Admit: 2013-03-11 | Discharge: 2013-03-11 | Disposition: A | Discharge status: Home / Self Care | Payer: Self-pay | Source: Ambulatory Visit | Attending: Cardiology | Admitting: Cardiology

## 2013-03-15 ENCOUNTER — Encounter (HOSPITAL_COMMUNITY)
Admission: RE | Admit: 2013-03-15 | Discharge: 2013-03-15 | Disposition: A | Discharge status: Home / Self Care | Payer: Self-pay | Source: Ambulatory Visit | Attending: Cardiology | Admitting: Cardiology

## 2013-03-16 ENCOUNTER — Encounter (HOSPITAL_COMMUNITY)
Admission: RE | Admit: 2013-03-16 | Discharge: 2013-03-16 | Disposition: A | Discharge status: Home / Self Care | Payer: Self-pay | Source: Ambulatory Visit | Attending: Cardiology | Admitting: Cardiology

## 2013-03-18 ENCOUNTER — Encounter (HOSPITAL_COMMUNITY)
Admission: RE | Admit: 2013-03-18 | Discharge: 2013-03-18 | Disposition: A | Discharge status: Home / Self Care | Payer: Self-pay | Source: Ambulatory Visit | Attending: Cardiology | Admitting: Cardiology

## 2013-03-22 ENCOUNTER — Encounter (HOSPITAL_COMMUNITY)
Admission: RE | Admit: 2013-03-22 | Discharge: 2013-03-22 | Disposition: A | Discharge status: Home / Self Care | Payer: Self-pay | Source: Ambulatory Visit | Attending: Cardiology | Admitting: Cardiology

## 2013-03-23 ENCOUNTER — Encounter (HOSPITAL_COMMUNITY)
Admission: RE | Admit: 2013-03-23 | Discharge: 2013-03-23 | Disposition: A | Discharge status: Home / Self Care | Payer: Self-pay | Source: Ambulatory Visit | Attending: Cardiology | Admitting: Cardiology

## 2013-03-25 ENCOUNTER — Encounter (HOSPITAL_COMMUNITY)
Admission: RE | Admit: 2013-03-25 | Discharge: 2013-03-25 | Disposition: A | Discharge status: Home / Self Care | Payer: Self-pay | Source: Ambulatory Visit | Attending: Cardiology | Admitting: Cardiology

## 2013-03-29 ENCOUNTER — Encounter (HOSPITAL_COMMUNITY)
Admission: RE | Admit: 2013-03-29 | Discharge: 2013-03-29 | Disposition: A | Discharge status: Home / Self Care | Payer: Self-pay | Source: Ambulatory Visit | Attending: Cardiology | Admitting: Cardiology

## 2013-03-30 ENCOUNTER — Encounter (HOSPITAL_COMMUNITY)
Admission: RE | Admit: 2013-03-30 | Discharge: 2013-03-30 | Disposition: A | Discharge status: Home / Self Care | Payer: Self-pay | Source: Ambulatory Visit | Attending: Cardiology | Admitting: Cardiology

## 2013-04-01 ENCOUNTER — Encounter (HOSPITAL_COMMUNITY): Payer: Self-pay

## 2013-04-05 ENCOUNTER — Encounter (HOSPITAL_COMMUNITY)
Admission: RE | Admit: 2013-04-05 | Discharge: 2013-04-05 | Disposition: A | Discharge status: Home / Self Care | Payer: Self-pay | Source: Ambulatory Visit | Attending: Cardiology | Admitting: Cardiology

## 2013-04-05 DIAGNOSIS — Z9861 Coronary angioplasty status: Secondary | ICD-10-CM | POA: Insufficient documentation

## 2013-04-05 DIAGNOSIS — Z5189 Encounter for other specified aftercare: Secondary | ICD-10-CM | POA: Insufficient documentation

## 2013-04-05 DIAGNOSIS — I2510 Atherosclerotic heart disease of native coronary artery without angina pectoris: Secondary | ICD-10-CM | POA: Insufficient documentation

## 2013-04-05 DIAGNOSIS — F17200 Nicotine dependence, unspecified, uncomplicated: Secondary | ICD-10-CM | POA: Insufficient documentation

## 2013-04-05 DIAGNOSIS — E785 Hyperlipidemia, unspecified: Secondary | ICD-10-CM | POA: Insufficient documentation

## 2013-04-05 DIAGNOSIS — Z7982 Long term (current) use of aspirin: Secondary | ICD-10-CM | POA: Insufficient documentation

## 2013-04-05 DIAGNOSIS — I10 Essential (primary) hypertension: Secondary | ICD-10-CM | POA: Insufficient documentation

## 2013-04-05 DIAGNOSIS — Z7902 Long term (current) use of antithrombotics/antiplatelets: Secondary | ICD-10-CM | POA: Insufficient documentation

## 2013-04-06 ENCOUNTER — Encounter (HOSPITAL_COMMUNITY)
Admission: RE | Admit: 2013-04-06 | Discharge: 2013-04-06 | Disposition: A | Discharge status: Home / Self Care | Payer: Self-pay | Source: Ambulatory Visit | Attending: Cardiology | Admitting: Cardiology

## 2013-04-08 ENCOUNTER — Encounter (HOSPITAL_COMMUNITY)
Admission: RE | Admit: 2013-04-08 | Discharge: 2013-04-08 | Disposition: A | Discharge status: Home / Self Care | Payer: Self-pay | Source: Ambulatory Visit | Attending: Cardiology | Admitting: Cardiology

## 2013-04-12 ENCOUNTER — Encounter (HOSPITAL_COMMUNITY)
Admission: RE | Admit: 2013-04-12 | Discharge: 2013-04-12 | Disposition: A | Discharge status: Home / Self Care | Payer: Self-pay | Source: Ambulatory Visit | Attending: Cardiology | Admitting: Cardiology

## 2013-04-13 ENCOUNTER — Encounter (HOSPITAL_COMMUNITY): Payer: Self-pay

## 2013-04-15 ENCOUNTER — Encounter (HOSPITAL_COMMUNITY)
Admission: RE | Admit: 2013-04-15 | Discharge: 2013-04-15 | Disposition: A | Discharge status: Home / Self Care | Payer: Self-pay | Source: Ambulatory Visit | Attending: Cardiology | Admitting: Cardiology

## 2013-04-19 ENCOUNTER — Encounter (HOSPITAL_COMMUNITY)
Admission: RE | Admit: 2013-04-19 | Discharge: 2013-04-19 | Disposition: A | Discharge status: Home / Self Care | Payer: Self-pay | Source: Ambulatory Visit | Attending: Cardiology | Admitting: Cardiology

## 2013-04-20 ENCOUNTER — Encounter (HOSPITAL_COMMUNITY)
Admission: RE | Admit: 2013-04-20 | Discharge: 2013-04-20 | Disposition: A | Discharge status: Home / Self Care | Payer: Self-pay | Source: Ambulatory Visit | Attending: Cardiology | Admitting: Cardiology

## 2013-04-22 ENCOUNTER — Encounter (HOSPITAL_COMMUNITY)
Admission: RE | Admit: 2013-04-22 | Discharge: 2013-04-22 | Disposition: A | Discharge status: Home / Self Care | Payer: Self-pay | Source: Ambulatory Visit | Attending: Cardiology | Admitting: Cardiology

## 2013-04-26 ENCOUNTER — Encounter (HOSPITAL_COMMUNITY)
Admission: RE | Admit: 2013-04-26 | Discharge: 2013-04-26 | Disposition: A | Discharge status: Home / Self Care | Payer: Self-pay | Source: Ambulatory Visit | Attending: Cardiology | Admitting: Cardiology

## 2013-04-27 ENCOUNTER — Encounter (HOSPITAL_COMMUNITY)
Admission: RE | Admit: 2013-04-27 | Discharge: 2013-04-27 | Disposition: A | Discharge status: Home / Self Care | Payer: Self-pay | Source: Ambulatory Visit | Attending: Cardiology | Admitting: Cardiology

## 2013-04-29 ENCOUNTER — Encounter (HOSPITAL_COMMUNITY)
Admission: RE | Admit: 2013-04-29 | Discharge: 2013-04-29 | Disposition: A | Discharge status: Home / Self Care | Payer: Self-pay | Source: Ambulatory Visit | Attending: Cardiology | Admitting: Cardiology

## 2013-05-04 ENCOUNTER — Encounter (HOSPITAL_COMMUNITY)
Admission: RE | Admit: 2013-05-04 | Discharge: 2013-05-04 | Disposition: A | Discharge status: Home / Self Care | Payer: Self-pay | Source: Ambulatory Visit | Attending: Cardiology | Admitting: Cardiology

## 2013-05-04 DIAGNOSIS — F17200 Nicotine dependence, unspecified, uncomplicated: Secondary | ICD-10-CM | POA: Insufficient documentation

## 2013-05-04 DIAGNOSIS — I10 Essential (primary) hypertension: Secondary | ICD-10-CM | POA: Insufficient documentation

## 2013-05-04 DIAGNOSIS — Z7902 Long term (current) use of antithrombotics/antiplatelets: Secondary | ICD-10-CM | POA: Insufficient documentation

## 2013-05-04 DIAGNOSIS — I2510 Atherosclerotic heart disease of native coronary artery without angina pectoris: Secondary | ICD-10-CM | POA: Insufficient documentation

## 2013-05-04 DIAGNOSIS — Z7982 Long term (current) use of aspirin: Secondary | ICD-10-CM | POA: Insufficient documentation

## 2013-05-04 DIAGNOSIS — Z5189 Encounter for other specified aftercare: Secondary | ICD-10-CM | POA: Insufficient documentation

## 2013-05-04 DIAGNOSIS — Z9861 Coronary angioplasty status: Secondary | ICD-10-CM | POA: Insufficient documentation

## 2013-05-04 DIAGNOSIS — E785 Hyperlipidemia, unspecified: Secondary | ICD-10-CM | POA: Insufficient documentation

## 2013-05-06 ENCOUNTER — Encounter (HOSPITAL_COMMUNITY)
Admission: RE | Admit: 2013-05-06 | Discharge: 2013-05-06 | Disposition: A | Discharge status: Home / Self Care | Payer: Self-pay | Source: Ambulatory Visit | Attending: Cardiology | Admitting: Cardiology

## 2013-05-10 ENCOUNTER — Encounter (HOSPITAL_COMMUNITY)
Admission: RE | Admit: 2013-05-10 | Discharge: 2013-05-10 | Disposition: A | Discharge status: Home / Self Care | Payer: Self-pay | Source: Ambulatory Visit | Attending: Cardiology | Admitting: Cardiology

## 2013-05-11 ENCOUNTER — Encounter (HOSPITAL_COMMUNITY): Payer: Self-pay

## 2013-05-13 ENCOUNTER — Encounter (HOSPITAL_COMMUNITY): Payer: Self-pay

## 2013-05-17 ENCOUNTER — Encounter (HOSPITAL_COMMUNITY): Payer: Self-pay

## 2013-05-18 ENCOUNTER — Encounter (HOSPITAL_COMMUNITY): Payer: Self-pay

## 2013-05-20 ENCOUNTER — Encounter (HOSPITAL_COMMUNITY): Payer: Self-pay

## 2013-05-24 ENCOUNTER — Encounter: Payer: Self-pay | Admitting: Pulmonary Disease

## 2013-05-24 ENCOUNTER — Ambulatory Visit (INDEPENDENT_AMBULATORY_CARE_PROVIDER_SITE_OTHER): Payer: Medicare Other | Admitting: Pulmonary Disease

## 2013-05-24 ENCOUNTER — Encounter (HOSPITAL_COMMUNITY)
Admission: RE | Admit: 2013-05-24 | Discharge: 2013-05-24 | Disposition: A | Discharge status: Home / Self Care | Payer: Self-pay | Source: Ambulatory Visit | Attending: Cardiology | Admitting: Cardiology

## 2013-05-24 VITALS — BP 108/62 | HR 87 | Temp 98.3°F | Ht 66.0 in | Wt 166.4 lb

## 2013-05-24 DIAGNOSIS — J449 Chronic obstructive pulmonary disease, unspecified: Secondary | ICD-10-CM

## 2013-05-24 NOTE — Patient Instructions (Addendum)
Stay on current medications Stop smoking. Can try mucinex dm extra strength one in am and pm to see if your head congestion improves. followup with me in 6mos if doing well.

## 2013-05-24 NOTE — Assessment & Plan Note (Signed)
The patient feels that he is at his usual baseline, and has not had a recent acute exacerbation.  Unfortunately, he continues to smoke, and we have had a long conversation about smoking cessation.  He understands that his breathing will continue to worsen his lungs he smokes.  I've asked him to continue with his current bronchodilator regimen, as well as his participation in pulmonary rehabilitation.

## 2013-05-24 NOTE — Progress Notes (Signed)
  Subjective:    Patient ID: Brad Turner, male    DOB: Jun 17, 1941, 72 y.o.   MRN: [id number]  HPI Patient comes in today for followup of his known COPD.  Unfortunately he continues to smoke, but feels his exertional tolerance is near his usual baseline.  He was on a study drug, but this ended, and he started back on his symbicort.  He actually saw significant improvement in his breathing off the study drug, and on symbicort.  He continues to have a mild cough with white mucus production, probably related to his smoking.  He is still participating in pulmonary rehabilitation.   Review of Systems  Constitutional: Negative for fever and unexpected weight change.  HENT: Positive for congestion ( chest/head). Negative for ear pain, nosebleeds, sore throat, rhinorrhea, sneezing, trouble swallowing, dental problem, postnasal drip and sinus pressure.   Eyes: Negative for redness and itching.  Respiratory: Positive for cough. Negative for chest tightness, shortness of breath and wheezing.   Cardiovascular: Negative for palpitations and leg swelling.  Gastrointestinal: Negative for nausea and vomiting.  Genitourinary: Negative for dysuria.  Musculoskeletal: Negative for joint swelling.  Skin: Negative for rash.  Neurological: Positive for dizziness ( from head congestion). Negative for headaches.  Hematological: Does not bruise/bleed easily.  Psychiatric/Behavioral: Negative for dysphoric mood. The patient is not nervous/anxious.        Objective:   Physical Exam Well-developed male in no acute distress Nose without purulent discharge noted Neck without lymphadenopathy or thyromegaly Chest with decreased breath sounds throughout, a few rhonchi, no wheezing Cardiac exam with regular rate and rhythm Lower extremities without edema, no cyanosis Alert and oriented, moves all 4 extremities.       Assessment & Plan:

## 2013-05-25 ENCOUNTER — Encounter (HOSPITAL_COMMUNITY)
Admission: RE | Admit: 2013-05-25 | Discharge: 2013-05-25 | Disposition: A | Discharge status: Home / Self Care | Payer: Self-pay | Source: Ambulatory Visit | Attending: Cardiology | Admitting: Cardiology

## 2013-05-27 ENCOUNTER — Encounter (HOSPITAL_COMMUNITY)
Admission: RE | Admit: 2013-05-27 | Discharge: 2013-05-27 | Disposition: A | Discharge status: Home / Self Care | Payer: Self-pay | Source: Ambulatory Visit | Attending: Cardiology | Admitting: Cardiology

## 2013-05-29 ENCOUNTER — Encounter (HOSPITAL_COMMUNITY): Payer: Self-pay | Admitting: Emergency Medicine

## 2013-05-29 ENCOUNTER — Emergency Department (HOSPITAL_COMMUNITY): Payer: Medicare Other

## 2013-05-29 ENCOUNTER — Emergency Department (HOSPITAL_COMMUNITY)
Admission: EM | Admit: 2013-05-29 | Discharge: 2013-05-29 | Discharge status: Against Medical Advice | Payer: Medicare Other | Attending: Emergency Medicine | Admitting: Emergency Medicine

## 2013-05-29 DIAGNOSIS — Z7982 Long term (current) use of aspirin: Secondary | ICD-10-CM | POA: Insufficient documentation

## 2013-05-29 DIAGNOSIS — F17200 Nicotine dependence, unspecified, uncomplicated: Secondary | ICD-10-CM | POA: Insufficient documentation

## 2013-05-29 DIAGNOSIS — Z8673 Personal history of transient ischemic attack (TIA), and cerebral infarction without residual deficits: Secondary | ICD-10-CM | POA: Insufficient documentation

## 2013-05-29 DIAGNOSIS — K219 Gastro-esophageal reflux disease without esophagitis: Secondary | ICD-10-CM | POA: Insufficient documentation

## 2013-05-29 DIAGNOSIS — Z7902 Long term (current) use of antithrombotics/antiplatelets: Secondary | ICD-10-CM | POA: Insufficient documentation

## 2013-05-29 DIAGNOSIS — I2510 Atherosclerotic heart disease of native coronary artery without angina pectoris: Secondary | ICD-10-CM | POA: Insufficient documentation

## 2013-05-29 DIAGNOSIS — M25519 Pain in unspecified shoulder: Secondary | ICD-10-CM | POA: Insufficient documentation

## 2013-05-29 DIAGNOSIS — E785 Hyperlipidemia, unspecified: Secondary | ICD-10-CM | POA: Insufficient documentation

## 2013-05-29 DIAGNOSIS — Z79899 Other long term (current) drug therapy: Secondary | ICD-10-CM | POA: Insufficient documentation

## 2013-05-29 DIAGNOSIS — IMO0002 Reserved for concepts with insufficient information to code with codable children: Secondary | ICD-10-CM | POA: Insufficient documentation

## 2013-05-29 DIAGNOSIS — M129 Arthropathy, unspecified: Secondary | ICD-10-CM | POA: Insufficient documentation

## 2013-05-29 DIAGNOSIS — M898X1 Other specified disorders of bone, shoulder: Secondary | ICD-10-CM

## 2013-05-29 DIAGNOSIS — I10 Essential (primary) hypertension: Secondary | ICD-10-CM | POA: Insufficient documentation

## 2013-05-29 DIAGNOSIS — Z9861 Coronary angioplasty status: Secondary | ICD-10-CM | POA: Insufficient documentation

## 2013-05-29 DIAGNOSIS — R0789 Other chest pain: Secondary | ICD-10-CM | POA: Insufficient documentation

## 2013-05-29 HISTORY — DX: Essential (primary) hypertension: I10

## 2013-05-29 LAB — CBC WITH DIFFERENTIAL/PLATELET
Basophils Absolute: 0 10*3/uL (ref 0.0–0.1)
Eosinophils Absolute: 0.1 10*3/uL (ref 0.0–0.7)
Eosinophils Relative: 2 % (ref 0–5)
HCT: 41.5 % (ref 39.0–52.0)
Hemoglobin: 13.9 g/dL (ref 13.0–17.0)
Lymphocytes Relative: 31 % (ref 12–46)
MCHC: 33.5 g/dL (ref 30.0–36.0)
MCV: 90.6 fL (ref 78.0–100.0)
Monocytes Absolute: 0.7 10*3/uL (ref 0.1–1.0)
Monocytes Relative: 10 % (ref 3–12)
Neutro Abs: 3.7 10*3/uL (ref 1.7–7.7)
Platelets: 341 10*3/uL (ref 150–400)
RDW: 13.6 % (ref 11.5–15.5)
WBC: 6.7 10*3/uL (ref 4.0–10.5)

## 2013-05-29 LAB — BASIC METABOLIC PANEL
BUN: 15 mg/dL (ref 6–23)
CO2: 26 mEq/L (ref 19–32)
Calcium: 8.7 mg/dL (ref 8.4–10.5)
Chloride: 102 mEq/L (ref 96–112)
Creatinine, Ser: 1.27 mg/dL (ref 0.50–1.35)
Potassium: 2.9 mEq/L — ABNORMAL LOW (ref 3.5–5.1)

## 2013-05-29 LAB — POCT I-STAT TROPONIN I: Troponin i, poc: 0 ng/mL (ref 0.00–0.08)

## 2013-05-29 NOTE — ED Provider Notes (Signed)
I saw and evaluated the patient, reviewed the resident's note and I agree with the findings and plan.  72-year-old male with history of coronary artery disease multiple angioplasties presents with chest and back pain. Back pain likely musculoskeletal, but when he was bending and stretching this morning while drinking coffee. Has been lifting a lot of heavy things at this time. While resting, he also noted some left-sided chest pain worse with inspiration. He denies any history of clots. He is not short of breath currently having chest pain while at rest. He does have this left anterior chest pain which is reproducible on palpation, however with deep inspiration worsens. Patient had labs are in triage with a negative troponin. He had a normal EKG. 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 He does not want to stay for any further testing. He is feeling like it is likely musculoskeletal, he was just concerned that this could be pericarditis. I told him it is likely not within normal EKG, however cannot rule out bad things such as a heart attack or a blood clot. It is not lasting for this further testing. Patient is amenable to signing AMA. Patient is alert oriented and of sound mind at this time. He is not delusional having hallucinations. Patient is easily ambulatory under his own free will and signed AMA.  Dagmar Hait, MD 05/29/13 1535

## 2013-05-29 NOTE — ED Notes (Signed)
Per pt, has had back pain that radiates to left chest since this am around 0900. Pt states "chest pain is slight; more pain in back." Pt denies SOB or dizziness. 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 Pt denies any other symptoms at present time.

## 2013-05-29 NOTE — ED Provider Notes (Signed)
CSN: [id number]     Arrival date & time 05/29/13  1318 History   First MD Initiated Contact with Patient 05/29/13 1347     Chief Complaint  Patient presents with  . Chest Pain  . Back Pain   HPI Pt is a 71 y/o M with PMHx of CVA, Hyperlipidemia, CAD, HTN, tobacco abuse, alcohol abuse who presents to the ED with L scapular pain with movement, L chest pain with inspiration.  Pt states he started to have some coldness in his R foot last PM, didn't think too much of it, went to bed and awoke this AM.  He proceeded to have coffee, on his second cup, noticed some left scapular pain with movement as well as some left chest pain with inspiration. Pain was unchanged from movement, not relieved from rest, and did not notice any dyspnea on exertion.  Denies any substernal CP, HA, blurred vision, diplopia, N/V/D, fever, chills, sweats, recent travel, lower extremity edema, worsening productive cough, or mucous production, dysuria, lightheadedness, weakness, dysarthria, dysphagia.    Smokes 1 ppd for the last 57 yrs, daily alcohol use, and denies illicit substance use.  Has been followed by Dr. Donnie Aho, cardiology, as well as Dr Wylene Simmer his PCP.     Past Medical History  Diagnosis Date  . CVA (cerebral infarction)   . Hyperlipidemia   . GERD (gastroesophageal reflux disease)   . CAD (coronary artery disease)   . Arthritis   . Hypertension    Past Surgical History  Procedure Laterality Date  . Appendectomy    . Angioplasty      x4  . Tonsillectomy    . Rotator cuff repair      bilateral  . Perithyroid     Family History  Problem Relation Age of Onset  . Stroke Mother   . Heart disease Father   . Rheum arthritis Mother   . Colon cancer      ? mat. uncle died age 95   History  Substance Use Topics  . Smoking status: Current Every Velis Smoker -- 1.00 packs/[place] for 56 years    Types: Cigarettes  . Smokeless tobacco: Never Used     Comment: Tobaccco info given 12/16/12  . Alcohol Use: 7.0  oz/week    14 drink(s) per week     Comment: every Roh, vodka    Review of Systems  Constitutional: Negative.  Negative for fever and chills.  HENT: Negative.  Negative for sore throat and trouble swallowing.   Eyes: Negative.  Negative for visual disturbance.  Respiratory: Positive for chest tightness. Negative for apnea, cough, shortness of breath and wheezing.   Cardiovascular: Negative for chest pain, palpitations and leg swelling.  Gastrointestinal: Negative for nausea, vomiting, diarrhea and constipation.  Endocrine: Negative.   Genitourinary: Negative for dysuria.  Musculoskeletal: Positive for back pain (L upper scapular region ).  Skin: Negative.   Neurological: Negative for dizziness, weakness and numbness.  Psychiatric/Behavioral: Negative.   All other systems reviewed and are negative.    Allergies  Ivp dye and Nitrofurantoin  Home Medications   Current Outpatient Rx  Name  Route  Sig  Dispense  Refill  . amLODipine (NORVASC) 10 MG tablet   Oral   Take 5 mg by mouth every morning.          Marland Kitchen aspirin 81 MG tablet   Oral   Take 81 mg by mouth every morning.          Marland Kitchen atorvastatin (  LIPITOR) 80 MG tablet   Oral   Take 80 mg by mouth every morning.          . budesonide-formoterol (SYMBICORT) 160-4.5 MCG/ACT inhaler   Inhalation   Inhale 2 puffs into the lungs 2 (two) times daily.   1 Inhaler   3     Patient needs to schedule follow up appt in 05/2013 ...   . clopidogrel (PLAVIX) 75 MG tablet   Oral   Take 75 mg by mouth every morning.          . fenofibrate (TRICOR) 145 MG tablet   Oral   Take 145 mg by mouth every morning.          . folic acid (FOLVITE) 1 MG tablet   Oral   Take 1 mg by mouth every morning.         Marland Kitchen glucosamine-chondroitin 500-400 MG tablet   Oral   Take 1 tablet by mouth every morning.         Marland Kitchen ibuprofen (ADVIL,MOTRIN) 200 MG tablet   Oral   Take 200 mg by mouth every 6 (six) hours as needed for pain.          . pantoprazole (PROTONIX) 40 MG tablet   Oral   Take 40 mg by mouth every morning.          . ramipril (ALTACE) 10 MG tablet   Oral   Take 10 mg by mouth every morning.          . tadalafil (CIALIS) 5 MG tablet   Oral   Take 5 mg by mouth as needed for erectile dysfunction.          Marland Kitchen albuterol (PROVENTIL HFA;VENTOLIN HFA) 108 (90 BASE) MCG/ACT inhaler   Inhalation   Inhale 2 puffs into the lungs every 6 (six) hours as needed for wheezing or shortness of breath.           BP 123/67  Temp(Src) 98.1 F (36.7 C) (Oral)  Resp 18  SpO2 97% Physical Exam  Constitutional: He is oriented to person, place, and time. He appears well-developed and well-nourished. No distress.  HENT:  Head: Normocephalic and atraumatic.  Eyes: Conjunctivae are normal. Pupils are equal, round, and reactive to light.  Neck: Normal range of motion. Neck supple. No JVD present.  Cardiovascular: Normal rate, regular rhythm and intact distal pulses.   Pulmonary/Chest: Effort normal and breath sounds normal. No respiratory distress. He has no wheezes. He has no rales. He exhibits tenderness.  Abdominal: Soft. Bowel sounds are normal. He exhibits no distension.  Musculoskeletal: Normal range of motion.  Lymphadenopathy:  No lower extremity edema B/L   Neurological: He is alert and oriented to person, place, and time.  Skin: Skin is warm and dry. No rash noted. He is not diaphoretic. No erythema. No pallor.    ED Course  Procedures (including critical care time)  Date: 05/29/2013  Rate: 86  Rhythm: normal sinus rhythm  QRS Axis: normal  Intervals: normal  ST/T Wave abnormalities: normal  Conduction Disutrbances: none  Narrative Interpretation: Normal EKG   Old EKG Reviewed: No significant changes noted  Labs Review Labs Reviewed  BASIC METABOLIC PANEL - Abnormal; Notable for the following:    Potassium 2.9 (*)    Glucose, Bld 118 (*)    GFR calc non Af Amer 56 (*)    GFR calc Af Amer  64 (*)    All other components within normal limits  CBC  WITH DIFFERENTIAL  POCT I-STAT TROPONIN I   Imaging Review Dg Chest 2 View  05/29/2013   CLINICAL DATA:  Back pain radiating to left chest.  EXAM: CHEST  2 VIEW  COMPARISON:  [phone]  FINDINGS: The heart size and mediastinal contours are within normal limits.  Lungs are hyperexpanded. There is interstitial thickening at the lung bases. No infiltrate or edema is seen. No pulmonary mass or nodule. No pleural effusion or pneumothorax.  The bony thorax is demineralized but intact.  IMPRESSION: No acute cardiopulmonary disease.  COPD   Electronically Signed   By: Amie Portland   On: 05/29/2013 15:05    MDM   1. Pain of left scapula   2. Left-sided chest wall pain   Pt with significant cardiac risk factors as well as history.  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  His Wells score is 0, TIMI/Heart are both 4.  He does not endorse current chest pain or typical chest pain from his story.  Will get basic lab work on him including POC troponin, BMET, CBC, CXR, and EKG.  Pt with previous history of pericarditis as well, however, pain is not worse with supine position and is not severe.  Is on ASA/Plavix currently for two previous CVAs.    3:09 PM - Pt EKG NSR w/o acute changes from previous and his POC troponin and CXR are basically nml.  Discussed with patient the benefits of possible overnight observation due to his risk factors, however, he is adamant about leaving at this time.  Gave pt option of leaving AMA, and he was extremely willing to do this so he could attend his musical concert.  D/C paperwork, d/c instructions, and he understands his risk of leaving AMA.   Twana First Paulina Fusi, DO of [facility] 05/29/2013, 3:23 PM      Briscoe Deutscher, DO 05/29/13 1523

## 2013-05-31 ENCOUNTER — Encounter (HOSPITAL_COMMUNITY): Payer: Self-pay

## 2013-06-01 ENCOUNTER — Encounter (HOSPITAL_COMMUNITY)
Admission: RE | Admit: 2013-06-01 | Discharge: 2013-06-01 | Disposition: A | Discharge status: Home / Self Care | Payer: Self-pay | Source: Ambulatory Visit | Attending: Cardiology | Admitting: Cardiology

## 2013-06-03 ENCOUNTER — Encounter (HOSPITAL_COMMUNITY)
Admission: RE | Admit: 2013-06-03 | Discharge: 2013-06-03 | Disposition: A | Discharge status: Home / Self Care | Payer: Self-pay | Source: Ambulatory Visit | Attending: Cardiology | Admitting: Cardiology

## 2013-06-03 DIAGNOSIS — I10 Essential (primary) hypertension: Secondary | ICD-10-CM | POA: Insufficient documentation

## 2013-06-03 DIAGNOSIS — I2510 Atherosclerotic heart disease of native coronary artery without angina pectoris: Secondary | ICD-10-CM | POA: Insufficient documentation

## 2013-06-03 DIAGNOSIS — Z7902 Long term (current) use of antithrombotics/antiplatelets: Secondary | ICD-10-CM | POA: Insufficient documentation

## 2013-06-03 DIAGNOSIS — F17200 Nicotine dependence, unspecified, uncomplicated: Secondary | ICD-10-CM | POA: Insufficient documentation

## 2013-06-03 DIAGNOSIS — E785 Hyperlipidemia, unspecified: Secondary | ICD-10-CM | POA: Insufficient documentation

## 2013-06-03 DIAGNOSIS — Z9861 Coronary angioplasty status: Secondary | ICD-10-CM | POA: Insufficient documentation

## 2013-06-03 DIAGNOSIS — Z5189 Encounter for other specified aftercare: Secondary | ICD-10-CM | POA: Insufficient documentation

## 2013-06-03 DIAGNOSIS — Z7982 Long term (current) use of aspirin: Secondary | ICD-10-CM | POA: Insufficient documentation

## 2013-06-07 ENCOUNTER — Encounter (HOSPITAL_COMMUNITY)
Admission: RE | Admit: 2013-06-07 | Discharge: 2013-06-07 | Disposition: A | Discharge status: Home / Self Care | Payer: Self-pay | Source: Ambulatory Visit | Attending: Cardiology | Admitting: Cardiology

## 2013-06-08 ENCOUNTER — Encounter (HOSPITAL_COMMUNITY)
Admission: RE | Admit: 2013-06-08 | Discharge: 2013-06-08 | Disposition: A | Discharge status: Home / Self Care | Payer: Self-pay | Source: Ambulatory Visit | Attending: Cardiology | Admitting: Cardiology

## 2013-06-10 ENCOUNTER — Encounter (HOSPITAL_COMMUNITY)
Admission: RE | Admit: 2013-06-10 | Discharge: 2013-06-10 | Disposition: A | Discharge status: Home / Self Care | Payer: Self-pay | Source: Ambulatory Visit | Attending: Cardiology | Admitting: Cardiology

## 2013-06-14 ENCOUNTER — Encounter (HOSPITAL_COMMUNITY)
Admission: RE | Admit: 2013-06-14 | Discharge: 2013-06-14 | Disposition: A | Discharge status: Home / Self Care | Payer: Self-pay | Source: Ambulatory Visit | Attending: Cardiology | Admitting: Cardiology

## 2013-06-15 ENCOUNTER — Encounter (HOSPITAL_COMMUNITY)
Admission: RE | Admit: 2013-06-15 | Discharge: 2013-06-15 | Disposition: A | Discharge status: Home / Self Care | Payer: Self-pay | Source: Ambulatory Visit | Attending: Cardiology | Admitting: Cardiology

## 2013-06-17 ENCOUNTER — Encounter (HOSPITAL_COMMUNITY)
Admission: RE | Admit: 2013-06-17 | Discharge: 2013-06-17 | Disposition: A | Discharge status: Home / Self Care | Payer: Self-pay | Source: Ambulatory Visit | Attending: Cardiology | Admitting: Cardiology

## 2013-06-20 ENCOUNTER — Encounter (HOSPITAL_COMMUNITY): Payer: Self-pay | Admitting: Emergency Medicine

## 2013-06-20 ENCOUNTER — Emergency Department (HOSPITAL_COMMUNITY)
Admission: EM | Admit: 2013-06-20 | Discharge: 2013-06-20 | Disposition: A | Discharge status: Home / Self Care | Payer: Medicare Other | Attending: Emergency Medicine | Admitting: Emergency Medicine

## 2013-06-20 DIAGNOSIS — IMO0002 Reserved for concepts with insufficient information to code with codable children: Secondary | ICD-10-CM | POA: Insufficient documentation

## 2013-06-20 DIAGNOSIS — E785 Hyperlipidemia, unspecified: Secondary | ICD-10-CM | POA: Insufficient documentation

## 2013-06-20 DIAGNOSIS — N390 Urinary tract infection, site not specified: Secondary | ICD-10-CM

## 2013-06-20 DIAGNOSIS — I2510 Atherosclerotic heart disease of native coronary artery without angina pectoris: Secondary | ICD-10-CM | POA: Insufficient documentation

## 2013-06-20 DIAGNOSIS — Z79899 Other long term (current) drug therapy: Secondary | ICD-10-CM | POA: Insufficient documentation

## 2013-06-20 DIAGNOSIS — N489 Disorder of penis, unspecified: Secondary | ICD-10-CM | POA: Insufficient documentation

## 2013-06-20 DIAGNOSIS — M549 Dorsalgia, unspecified: Secondary | ICD-10-CM | POA: Insufficient documentation

## 2013-06-20 DIAGNOSIS — M129 Arthropathy, unspecified: Secondary | ICD-10-CM | POA: Insufficient documentation

## 2013-06-20 DIAGNOSIS — Z7982 Long term (current) use of aspirin: Secondary | ICD-10-CM | POA: Insufficient documentation

## 2013-06-20 DIAGNOSIS — F17200 Nicotine dependence, unspecified, uncomplicated: Secondary | ICD-10-CM | POA: Insufficient documentation

## 2013-06-20 DIAGNOSIS — K219 Gastro-esophageal reflux disease without esophagitis: Secondary | ICD-10-CM | POA: Insufficient documentation

## 2013-06-20 DIAGNOSIS — Z8673 Personal history of transient ischemic attack (TIA), and cerebral infarction without residual deficits: Secondary | ICD-10-CM | POA: Insufficient documentation

## 2013-06-20 DIAGNOSIS — R319 Hematuria, unspecified: Secondary | ICD-10-CM

## 2013-06-20 LAB — URINALYSIS, ROUTINE W REFLEX MICROSCOPIC
Bilirubin Urine: NEGATIVE
Glucose, UA: NEGATIVE mg/dL
Protein, ur: 30 mg/dL — AB
Specific Gravity, Urine: 1.022 (ref 1.005–1.030)
Urobilinogen, UA: 1 mg/dL (ref 0.0–1.0)
pH: 7 (ref 5.0–8.0)

## 2013-06-20 LAB — URINE MICROSCOPIC-ADD ON

## 2013-06-20 MED ORDER — CEPHALEXIN 500 MG PO CAPS: 500.0000 mg | ORAL_CAPSULE | Freq: Two times a day (BID) | ORAL | Status: DC

## 2013-06-20 NOTE — ED Provider Notes (Signed)
CSN: [id number]     Arrival date & time 06/20/13  0915 History   None    Chief Complaint  Patient presents with  . Hematuria    HPI  Brad Turner is a 72 y.o. male with a PMH of GERD, CAD, arthritis, HTN, CVA, and arthritis who presents to the ED for evaluation of hematuria.  History was provided by the patient.  Patient states that he developed gross hematuria last night. He states that he has had mild drainage of blood from his penis as well.  He has mild discomfort to his penis. He denies any trauma to the genital region. He states he was diagnosed with a urinary tract infection at [facility] about a month ago. He was given a prescription for Cipro. He states that he did not complete the medication. He took a few doses and stopped taking the medication.  He took one pill last night as well as one pill of his Cipro this morning because he thought his urinary tract infection was returning. He denies any dysuria however feels bladder pressure he urinates.  He denies any difficulty with stream or urinary retention.  He denies any abdominal pain, nausea, vomiting, constipation, or diarrhea. He states he has lower back pain at baseline with no acute changes. He denies any fevers, change in appetite or activity, chest pain, SOB, headache, dizziness or lightheadedness.  He denies any history of BPH in the past. He states that he is on Cialis. He states that he called his PCP after he was diagnosed with the UTI a month ago and was given a referral to a urologist however has not been able to make an appointment. He states that he has had hematuria in the past however has not been evaluated extensively for it. Patient is a current smoker. He denies any recent weight loss.   Past Medical History  Diagnosis Date  . CVA (cerebral infarction)   . Hyperlipidemia   . GERD (gastroesophageal reflux disease)   . CAD (coronary artery disease)   . Arthritis   . Hypertension    Past Surgical History  Procedure  Laterality Date  . Appendectomy    . Angioplasty      x4  . Tonsillectomy    . Rotator cuff repair      bilateral  . Perithyroid     Family History  Problem Relation Age of Onset  . Stroke Mother   . Heart disease Father   . Rheum arthritis Mother   . Colon cancer      ? mat. uncle died age 36   History  Substance Use Topics  . Smoking status: Current Every [place] Smoker -- 1.00 packs/[place] for 56 years    Types: Cigarettes  . Smokeless tobacco: Never Used     Comment: Tobaccco info given 12/16/12  . Alcohol Use: 7.0 oz/week    14 drink(s) per week     Comment: every [place], vodka    Review of Systems  Constitutional: Negative for fever, chills, activity change, appetite change and fatigue.  HENT: Negative for congestion and rhinorrhea.   Eyes: Negative for visual disturbance.  Respiratory: Negative for cough and shortness of breath.   Cardiovascular: Negative for chest pain and leg swelling.  Gastrointestinal: Negative for nausea, vomiting, abdominal pain, diarrhea and constipation.  Genitourinary: Positive for hematuria and penile pain. Negative for dysuria, urgency, frequency, flank pain, decreased urine volume, discharge, penile swelling, scrotal swelling, enuresis, difficulty urinating, genital sores and testicular pain.  Musculoskeletal: Positive for back pain (at baseline). Negative for gait problem, myalgias and neck pain.  Skin: Negative for color change and wound.  Neurological: Negative for dizziness, syncope, weakness, light-headedness, numbness and headaches.  Psychiatric/Behavioral: Negative for confusion.    Allergies  Ivp dye and Nitrofurantoin  Home Medications   Current Outpatient Rx  Name  Route  Sig  Dispense  Refill  . albuterol (PROVENTIL HFA;VENTOLIN HFA) 108 (90 BASE) MCG/ACT inhaler   Inhalation   Inhale 2 puffs into the lungs every 6 (six) hours as needed for wheezing or shortness of breath.          Marland Kitchen amLODipine (NORVASC) 10 MG tablet   Oral    Take 5 mg by mouth every morning.          Marland Kitchen aspirin 81 MG tablet   Oral   Take 81 mg by mouth every morning.          Marland Kitchen atorvastatin (LIPITOR) 80 MG tablet   Oral   Take 80 mg by mouth every morning.          . budesonide-formoterol (SYMBICORT) 160-4.5 MCG/ACT inhaler   Inhalation   Inhale 2 puffs into the lungs 2 (two) times daily.   1 Inhaler   3     Patient needs to schedule follow up appt in 05/2013 ...   . clopidogrel (PLAVIX) 75 MG tablet   Oral   Take 75 mg by mouth every morning.          . fenofibrate (TRICOR) 145 MG tablet   Oral   Take 145 mg by mouth every morning.          . folic acid (FOLVITE) 1 MG tablet   Oral   Take 1 mg by mouth every morning.         Marland Kitchen glucosamine-chondroitin 500-400 MG tablet   Oral   Take 1 tablet by mouth every morning.         Marland Kitchen NITROSTAT 0.4 MG SL tablet   Sublingual   Place 0.4 mg under the tongue every 5 (five) minutes as needed for chest pain.          . pantoprazole (PROTONIX) 40 MG tablet   Oral   Take 40 mg by mouth every morning.          . ramipril (ALTACE) 10 MG tablet   Oral   Take 10 mg by mouth every morning.          . tadalafil (CIALIS) 5 MG tablet   Oral   Take 5 mg by mouth as needed for erectile dysfunction.           BP 155/88  Pulse 86  Temp(Src) 98.1 F (36.7 C) (Oral)  Resp 20  SpO2 94%  Filed Vitals:   06/20/13 0930 06/20/13 1138  BP: 155/88 140/87  Pulse: 86 80  Temp: 98.1 F (36.7 C) 98 F (36.7 C)  TempSrc: Oral Oral  Resp: 20 16  SpO2: 94% 92%     Physical Exam  Nursing note and vitals reviewed. Constitutional: He is oriented to person, place, and time. He appears well-developed and well-nourished. No distress.  HENT:  Head: Normocephalic and atraumatic.  Right Ear: External ear normal.  Left Ear: External ear normal.  Nose: Nose normal.  Mouth/Throat: Oropharynx is clear and moist.  Eyes: Conjunctivae are normal. Pupils are equal, round, and  reactive to light. Right eye exhibits no discharge. Left eye exhibits no discharge.  Neck: Normal range of motion. Neck supple.  Cardiovascular: Normal rate, regular rhythm, normal heart sounds and intact distal pulses.  Exam reveals no gallop and no friction rub.   No murmur heard. Pulmonary/Chest: Effort normal and breath sounds normal. No respiratory distress. He has no wheezes. He has no rales. He exhibits no tenderness.  Abdominal: Soft. Bowel sounds are normal. He exhibits no distension and no mass. There is no tenderness. There is no rebound and no guarding.  Genitourinary: No penile tenderness.  No penile or testicular tenderness.  Small amount of blood present in the urethral meatus.  No active bleeding.  No signs of trauma to the genital area.    Musculoskeletal: Normal range of motion. He exhibits no edema and no tenderness.  No CVA tenderness bilaterally  Neurological: He is alert and oriented to person, place, and time.  Skin: Skin is warm and dry. He is not diaphoretic.    ED Course  Procedures (including critical care time) Labs Review Labs Reviewed  URINALYSIS, ROUTINE W REFLEX MICROSCOPIC   Imaging Review No results found.  EKG Interpretation   None      Results for orders placed during the hospital encounter of 06/20/13  URINALYSIS, ROUTINE W REFLEX MICROSCOPIC      Result Value Range   Color, Urine AMBER (*) YELLOW   APPearance CLEAR  CLEAR   Specific Gravity, Urine 1.022  1.005 - 1.030   pH 7.0  5.0 - 8.0   Glucose, UA NEGATIVE  NEGATIVE mg/dL   Hgb urine dipstick LARGE (*) NEGATIVE   Bilirubin Urine NEGATIVE  NEGATIVE   Ketones, ur NEGATIVE  NEGATIVE mg/dL   Protein, ur 30 (*) NEGATIVE mg/dL   Urobilinogen, UA 1.0  0.0 - 1.0 mg/dL   Nitrite NEGATIVE  NEGATIVE   Leukocytes, UA SMALL (*) NEGATIVE  URINE MICROSCOPIC-ADD ON      Result Value Range   RBC / HPF TOO NUMEROUS TO COUNT  <3 RBC/hpf    MDM   1. UTI (urinary tract infection)   2. Hematuria      Brad Turner is a 72 y.o. male with a PMH of GERD, CAD, arthritis, HTN, CVA, and arthritis who presents to the ED for evaluation of hematuria.  UA ordered to further evaluate.     Rechecks  11:15 AM = patient ambulating around the hallway in no acute distress.   Etiology of hematuria is possibly due to a urinary tract infection.  Patient was recently treated for a urinary tract infection one month ago. He however did not take his antibiotics as described. He took 2 doses of the medication and "felt better" so he stopped taking them. Patient was afebrile, nontoxic, and remained in no acute distress. His abdominal exam was benign. Urine sent for culture.  Patient was prescribed Keflex for outpatient management.  He was instructed to continue to take the full dose of antibiotics even if his symptoms improve.  He was given referral to Dr. Annabell Howells with urology.  He states that this is the provider he was going to followup with prior to his onset of symptoms today. Patient was instructed to return to the ED if they experience any profuse or continued hematuria, fever, severe abdominal pain, or other concerns.  Patient was in agreement with discharge and plan.     Final impressions: 1. UTI  2. Hematuria     Luiz Iron PA-C   This patient was discussed with Dr. Denton Lank  Jillyn Ledger, PA-C 06/20/13 1714

## 2013-06-20 NOTE — ED Notes (Signed)
He c/o gross hematuria with some bladder area discomfort.  He states he was treated for u.t.i. About 3 weeks ago.  He is in no distress.

## 2013-06-21 ENCOUNTER — Encounter (HOSPITAL_COMMUNITY): Payer: Medicare Other

## 2013-06-21 LAB — URINE CULTURE
Colony Count: NO GROWTH
Culture: NO GROWTH

## 2013-06-21 NOTE — ED Provider Notes (Signed)
Medical screening examination/treatment/procedure(s) were conducted as a shared visit with non-physician practitioner(s) and myself.  I personally evaluated the patient during the encounter Pt c/o hematuria. Feels as if is able to empty bladder. No abd or flank pain. No fevers. Ua. Urology follow up.   Suzi Roots, MD 06/21/13 [phone]

## 2013-06-22 ENCOUNTER — Encounter (HOSPITAL_COMMUNITY)
Admission: RE | Admit: 2013-06-22 | Discharge: 2013-06-22 | Disposition: A | Discharge status: Home / Self Care | Payer: Self-pay | Source: Ambulatory Visit | Attending: Cardiology | Admitting: Cardiology

## 2013-06-24 ENCOUNTER — Encounter (HOSPITAL_COMMUNITY): Payer: Medicare Other

## 2013-06-28 ENCOUNTER — Encounter (HOSPITAL_COMMUNITY): Payer: Medicare Other

## 2013-06-29 ENCOUNTER — Encounter (HOSPITAL_COMMUNITY)
Admission: RE | Admit: 2013-06-29 | Discharge: 2013-06-29 | Disposition: A | Discharge status: Home / Self Care | Payer: Self-pay | Source: Ambulatory Visit | Attending: Cardiology | Admitting: Cardiology

## 2013-07-01 ENCOUNTER — Encounter (HOSPITAL_COMMUNITY)
Admission: RE | Admit: 2013-07-01 | Discharge: 2013-07-01 | Disposition: A | Discharge status: Home / Self Care | Payer: Self-pay | Source: Ambulatory Visit | Attending: Cardiology | Admitting: Cardiology

## 2013-07-05 ENCOUNTER — Encounter (HOSPITAL_COMMUNITY)
Admission: RE | Admit: 2013-07-05 | Discharge: 2013-07-05 | Disposition: A | Discharge status: Home / Self Care | Payer: Self-pay | Source: Ambulatory Visit | Attending: Cardiology | Admitting: Cardiology

## 2013-07-05 DIAGNOSIS — Z7982 Long term (current) use of aspirin: Secondary | ICD-10-CM | POA: Insufficient documentation

## 2013-07-05 DIAGNOSIS — Z7902 Long term (current) use of antithrombotics/antiplatelets: Secondary | ICD-10-CM | POA: Insufficient documentation

## 2013-07-05 DIAGNOSIS — I2510 Atherosclerotic heart disease of native coronary artery without angina pectoris: Secondary | ICD-10-CM | POA: Insufficient documentation

## 2013-07-05 DIAGNOSIS — E785 Hyperlipidemia, unspecified: Secondary | ICD-10-CM | POA: Insufficient documentation

## 2013-07-05 DIAGNOSIS — Z5189 Encounter for other specified aftercare: Secondary | ICD-10-CM | POA: Insufficient documentation

## 2013-07-05 DIAGNOSIS — F17200 Nicotine dependence, unspecified, uncomplicated: Secondary | ICD-10-CM | POA: Insufficient documentation

## 2013-07-05 DIAGNOSIS — Z9861 Coronary angioplasty status: Secondary | ICD-10-CM | POA: Insufficient documentation

## 2013-07-05 DIAGNOSIS — I10 Essential (primary) hypertension: Secondary | ICD-10-CM | POA: Insufficient documentation

## 2013-07-06 ENCOUNTER — Encounter (HOSPITAL_COMMUNITY): Payer: Medicare Other

## 2013-07-08 ENCOUNTER — Encounter (HOSPITAL_COMMUNITY)
Admission: RE | Admit: 2013-07-08 | Discharge: 2013-07-08 | Disposition: A | Discharge status: Home / Self Care | Payer: Self-pay | Source: Ambulatory Visit | Attending: Cardiology | Admitting: Cardiology

## 2013-07-08 ENCOUNTER — Other Ambulatory Visit: Payer: Self-pay

## 2013-07-12 ENCOUNTER — Encounter (HOSPITAL_COMMUNITY): Payer: Medicare Other

## 2013-07-13 ENCOUNTER — Encounter (HOSPITAL_COMMUNITY)
Admission: RE | Admit: 2013-07-13 | Discharge: 2013-07-13 | Disposition: A | Discharge status: Home / Self Care | Payer: Self-pay | Source: Ambulatory Visit | Attending: Cardiology | Admitting: Cardiology

## 2013-07-15 ENCOUNTER — Encounter (HOSPITAL_COMMUNITY): Payer: Medicare Other

## 2013-07-19 ENCOUNTER — Encounter (HOSPITAL_COMMUNITY): Payer: Medicare Other

## 2013-07-20 ENCOUNTER — Encounter (HOSPITAL_COMMUNITY): Payer: Medicare Other

## 2013-07-21 HISTORY — PX: CYSTOSCOPY: SUR368

## 2013-07-22 ENCOUNTER — Encounter (HOSPITAL_COMMUNITY)
Admission: RE | Admit: 2013-07-22 | Discharge: 2013-07-22 | Disposition: A | Discharge status: Home / Self Care | Payer: Self-pay | Source: Ambulatory Visit | Attending: Cardiology | Admitting: Cardiology

## 2013-07-26 ENCOUNTER — Encounter (HOSPITAL_COMMUNITY)
Admission: RE | Admit: 2013-07-26 | Discharge: 2013-07-26 | Disposition: A | Discharge status: Home / Self Care | Payer: Self-pay | Source: Ambulatory Visit | Attending: Cardiology | Admitting: Cardiology

## 2013-07-27 ENCOUNTER — Encounter (HOSPITAL_COMMUNITY)
Admission: RE | Admit: 2013-07-27 | Discharge: 2013-07-27 | Disposition: A | Discharge status: Home / Self Care | Payer: Self-pay | Source: Ambulatory Visit | Attending: Cardiology | Admitting: Cardiology

## 2013-08-02 ENCOUNTER — Encounter (HOSPITAL_COMMUNITY)
Admission: RE | Admit: 2013-08-02 | Discharge: 2013-08-02 | Disposition: A | Discharge status: Home / Self Care | Payer: Self-pay | Source: Ambulatory Visit | Attending: Cardiology | Admitting: Cardiology

## 2013-08-02 DIAGNOSIS — I10 Essential (primary) hypertension: Secondary | ICD-10-CM | POA: Insufficient documentation

## 2013-08-02 DIAGNOSIS — I2510 Atherosclerotic heart disease of native coronary artery without angina pectoris: Secondary | ICD-10-CM | POA: Insufficient documentation

## 2013-08-02 DIAGNOSIS — E785 Hyperlipidemia, unspecified: Secondary | ICD-10-CM | POA: Insufficient documentation

## 2013-08-02 DIAGNOSIS — Z7982 Long term (current) use of aspirin: Secondary | ICD-10-CM | POA: Insufficient documentation

## 2013-08-02 DIAGNOSIS — Z7902 Long term (current) use of antithrombotics/antiplatelets: Secondary | ICD-10-CM | POA: Insufficient documentation

## 2013-08-02 DIAGNOSIS — Z9861 Coronary angioplasty status: Secondary | ICD-10-CM | POA: Insufficient documentation

## 2013-08-02 DIAGNOSIS — F17200 Nicotine dependence, unspecified, uncomplicated: Secondary | ICD-10-CM | POA: Insufficient documentation

## 2013-08-02 DIAGNOSIS — Z5189 Encounter for other specified aftercare: Secondary | ICD-10-CM | POA: Insufficient documentation

## 2013-08-03 ENCOUNTER — Encounter (HOSPITAL_COMMUNITY)
Admission: RE | Admit: 2013-08-03 | Discharge: 2013-08-03 | Disposition: A | Discharge status: Home / Self Care | Payer: Self-pay | Source: Ambulatory Visit | Attending: Cardiology | Admitting: Cardiology

## 2013-08-05 ENCOUNTER — Encounter (HOSPITAL_COMMUNITY)
Admission: RE | Admit: 2013-08-05 | Discharge: 2013-08-05 | Disposition: A | Discharge status: Home / Self Care | Payer: Self-pay | Source: Ambulatory Visit | Attending: Cardiology | Admitting: Cardiology

## 2013-08-09 ENCOUNTER — Encounter (HOSPITAL_COMMUNITY)
Admission: RE | Admit: 2013-08-09 | Discharge: 2013-08-09 | Disposition: A | Discharge status: Home / Self Care | Payer: Self-pay | Source: Ambulatory Visit | Attending: Cardiology | Admitting: Cardiology

## 2013-08-10 ENCOUNTER — Encounter (HOSPITAL_COMMUNITY)
Admission: RE | Admit: 2013-08-10 | Discharge: 2013-08-10 | Disposition: A | Discharge status: Home / Self Care | Payer: Self-pay | Source: Ambulatory Visit | Attending: Cardiology | Admitting: Cardiology

## 2013-08-12 ENCOUNTER — Encounter (HOSPITAL_COMMUNITY)
Admission: RE | Admit: 2013-08-12 | Discharge: 2013-08-12 | Disposition: A | Discharge status: Home / Self Care | Payer: Self-pay | Source: Ambulatory Visit | Attending: Cardiology | Admitting: Cardiology

## 2013-08-16 ENCOUNTER — Encounter (HOSPITAL_COMMUNITY): Payer: Medicare Other

## 2013-08-17 ENCOUNTER — Encounter (HOSPITAL_COMMUNITY)
Admission: RE | Admit: 2013-08-17 | Discharge: 2013-08-17 | Disposition: A | Discharge status: Home / Self Care | Payer: Self-pay | Source: Ambulatory Visit | Attending: Cardiology | Admitting: Cardiology

## 2013-08-19 ENCOUNTER — Encounter (HOSPITAL_COMMUNITY)
Admission: RE | Admit: 2013-08-19 | Discharge: 2013-08-19 | Disposition: A | Discharge status: Home / Self Care | Payer: Self-pay | Source: Ambulatory Visit | Attending: Cardiology | Admitting: Cardiology

## 2013-08-23 ENCOUNTER — Encounter (HOSPITAL_COMMUNITY)
Admission: RE | Admit: 2013-08-23 | Discharge: 2013-08-23 | Disposition: A | Discharge status: Home / Self Care | Payer: Self-pay | Source: Ambulatory Visit | Attending: Cardiology | Admitting: Cardiology

## 2013-08-24 ENCOUNTER — Encounter (HOSPITAL_COMMUNITY)
Admission: RE | Admit: 2013-08-24 | Discharge: 2013-08-24 | Disposition: A | Discharge status: Home / Self Care | Payer: Self-pay | Source: Ambulatory Visit | Attending: Cardiology | Admitting: Cardiology

## 2013-08-30 ENCOUNTER — Encounter (HOSPITAL_COMMUNITY)
Admission: RE | Admit: 2013-08-30 | Discharge: 2013-08-30 | Disposition: A | Discharge status: Home / Self Care | Payer: Self-pay | Source: Ambulatory Visit | Attending: Cardiology | Admitting: Cardiology

## 2013-08-31 ENCOUNTER — Encounter (HOSPITAL_COMMUNITY): Payer: Medicare Other

## 2013-09-06 ENCOUNTER — Encounter (HOSPITAL_COMMUNITY)
Admission: RE | Admit: 2013-09-06 | Discharge: 2013-09-06 | Disposition: A | Discharge status: Home / Self Care | Payer: Self-pay | Source: Ambulatory Visit | Attending: Cardiology | Admitting: Cardiology

## 2013-09-06 DIAGNOSIS — Z9861 Coronary angioplasty status: Secondary | ICD-10-CM | POA: Insufficient documentation

## 2013-09-06 DIAGNOSIS — Z7982 Long term (current) use of aspirin: Secondary | ICD-10-CM | POA: Insufficient documentation

## 2013-09-06 DIAGNOSIS — I10 Essential (primary) hypertension: Secondary | ICD-10-CM | POA: Insufficient documentation

## 2013-09-06 DIAGNOSIS — E785 Hyperlipidemia, unspecified: Secondary | ICD-10-CM | POA: Insufficient documentation

## 2013-09-06 DIAGNOSIS — I2510 Atherosclerotic heart disease of native coronary artery without angina pectoris: Secondary | ICD-10-CM | POA: Insufficient documentation

## 2013-09-06 DIAGNOSIS — Z5189 Encounter for other specified aftercare: Secondary | ICD-10-CM | POA: Insufficient documentation

## 2013-09-06 DIAGNOSIS — Z7902 Long term (current) use of antithrombotics/antiplatelets: Secondary | ICD-10-CM | POA: Insufficient documentation

## 2013-09-06 DIAGNOSIS — F17200 Nicotine dependence, unspecified, uncomplicated: Secondary | ICD-10-CM | POA: Insufficient documentation

## 2013-09-07 ENCOUNTER — Encounter (HOSPITAL_COMMUNITY)
Admission: RE | Admit: 2013-09-07 | Discharge: 2013-09-07 | Disposition: A | Discharge status: Home / Self Care | Payer: Self-pay | Source: Ambulatory Visit | Attending: Cardiology | Admitting: Cardiology

## 2013-09-09 ENCOUNTER — Encounter (HOSPITAL_COMMUNITY): Payer: Medicare Other

## 2013-09-13 ENCOUNTER — Encounter (HOSPITAL_COMMUNITY)
Admission: RE | Admit: 2013-09-13 | Discharge: 2013-09-13 | Disposition: A | Discharge status: Home / Self Care | Payer: Self-pay | Source: Ambulatory Visit | Attending: Cardiology | Admitting: Cardiology

## 2013-09-14 ENCOUNTER — Encounter (HOSPITAL_COMMUNITY)
Admission: RE | Admit: 2013-09-14 | Discharge: 2013-09-14 | Disposition: A | Discharge status: Home / Self Care | Payer: Self-pay | Source: Ambulatory Visit | Attending: Cardiology | Admitting: Cardiology

## 2013-09-15 ENCOUNTER — Other Ambulatory Visit: Payer: Self-pay | Admitting: Internal Medicine

## 2013-09-15 DIAGNOSIS — R911 Solitary pulmonary nodule: Secondary | ICD-10-CM

## 2013-09-16 ENCOUNTER — Encounter (HOSPITAL_COMMUNITY)
Admission: RE | Admit: 2013-09-16 | Discharge: 2013-09-16 | Disposition: A | Discharge status: Home / Self Care | Payer: Self-pay | Source: Ambulatory Visit | Attending: Cardiology | Admitting: Cardiology

## 2013-09-17 ENCOUNTER — Other Ambulatory Visit: Payer: Self-pay | Admitting: Internal Medicine

## 2013-09-17 ENCOUNTER — Ambulatory Visit
Admission: RE | Admit: 2013-09-17 | Discharge: 2013-09-17 | Disposition: A | Discharge status: Home / Self Care | Payer: Medicare Other | Source: Ambulatory Visit | Attending: Internal Medicine | Admitting: Internal Medicine

## 2013-09-17 ENCOUNTER — Ambulatory Visit
Admission: RE | Admit: 2013-09-17 | Discharge: 2013-09-17 | Disposition: A | Discharge status: Home / Self Care | Payer: 59 | Source: Ambulatory Visit | Attending: Internal Medicine | Admitting: Internal Medicine

## 2013-09-17 DIAGNOSIS — R911 Solitary pulmonary nodule: Secondary | ICD-10-CM

## 2013-09-20 ENCOUNTER — Encounter (HOSPITAL_COMMUNITY): Payer: Medicare Other

## 2013-09-21 ENCOUNTER — Encounter (HOSPITAL_COMMUNITY)
Admission: RE | Admit: 2013-09-21 | Discharge: 2013-09-21 | Disposition: A | Discharge status: Home / Self Care | Payer: Self-pay | Source: Ambulatory Visit | Attending: Cardiology | Admitting: Cardiology

## 2013-09-23 ENCOUNTER — Encounter (HOSPITAL_COMMUNITY): Payer: Medicare Other

## 2013-09-27 ENCOUNTER — Encounter (HOSPITAL_COMMUNITY): Payer: Medicare Other

## 2013-09-28 ENCOUNTER — Encounter (HOSPITAL_COMMUNITY)
Admission: RE | Admit: 2013-09-28 | Discharge: 2013-09-28 | Disposition: A | Discharge status: Home / Self Care | Payer: Self-pay | Source: Ambulatory Visit | Attending: Cardiology | Admitting: Cardiology

## 2013-09-30 ENCOUNTER — Encounter (HOSPITAL_COMMUNITY)
Admission: RE | Admit: 2013-09-30 | Discharge: 2013-09-30 | Disposition: A | Discharge status: Home / Self Care | Payer: Self-pay | Source: Ambulatory Visit | Attending: Cardiology | Admitting: Cardiology

## 2013-10-04 ENCOUNTER — Encounter (HOSPITAL_COMMUNITY)
Admission: RE | Admit: 2013-10-04 | Discharge: 2013-10-04 | Disposition: A | Discharge status: Home / Self Care | Payer: Self-pay | Source: Ambulatory Visit | Attending: Cardiology | Admitting: Cardiology

## 2013-10-04 DIAGNOSIS — I2510 Atherosclerotic heart disease of native coronary artery without angina pectoris: Secondary | ICD-10-CM | POA: Insufficient documentation

## 2013-10-04 DIAGNOSIS — Z5189 Encounter for other specified aftercare: Secondary | ICD-10-CM | POA: Insufficient documentation

## 2013-10-04 DIAGNOSIS — E785 Hyperlipidemia, unspecified: Secondary | ICD-10-CM | POA: Insufficient documentation

## 2013-10-04 DIAGNOSIS — F17200 Nicotine dependence, unspecified, uncomplicated: Secondary | ICD-10-CM | POA: Insufficient documentation

## 2013-10-04 DIAGNOSIS — Z7902 Long term (current) use of antithrombotics/antiplatelets: Secondary | ICD-10-CM | POA: Insufficient documentation

## 2013-10-04 DIAGNOSIS — Z9861 Coronary angioplasty status: Secondary | ICD-10-CM | POA: Insufficient documentation

## 2013-10-04 DIAGNOSIS — Z7982 Long term (current) use of aspirin: Secondary | ICD-10-CM | POA: Insufficient documentation

## 2013-10-04 DIAGNOSIS — I10 Essential (primary) hypertension: Secondary | ICD-10-CM | POA: Insufficient documentation

## 2013-10-05 ENCOUNTER — Encounter (HOSPITAL_COMMUNITY)
Admission: RE | Admit: 2013-10-05 | Discharge: 2013-10-05 | Disposition: A | Discharge status: Home / Self Care | Payer: Self-pay | Source: Ambulatory Visit | Attending: Cardiology | Admitting: Cardiology

## 2013-10-07 ENCOUNTER — Encounter (HOSPITAL_COMMUNITY)
Admission: RE | Admit: 2013-10-07 | Discharge: 2013-10-07 | Disposition: A | Discharge status: Home / Self Care | Payer: Self-pay | Source: Ambulatory Visit | Attending: Cardiology | Admitting: Cardiology

## 2013-10-11 ENCOUNTER — Encounter (HOSPITAL_COMMUNITY)
Admission: RE | Admit: 2013-10-11 | Discharge: 2013-10-11 | Disposition: A | Discharge status: Home / Self Care | Payer: Self-pay | Source: Ambulatory Visit | Attending: Cardiology | Admitting: Cardiology

## 2013-10-12 ENCOUNTER — Encounter (HOSPITAL_COMMUNITY)
Admission: RE | Admit: 2013-10-12 | Discharge: 2013-10-12 | Disposition: A | Discharge status: Home / Self Care | Payer: Self-pay | Source: Ambulatory Visit | Attending: Cardiology | Admitting: Cardiology

## 2013-10-14 ENCOUNTER — Encounter (HOSPITAL_COMMUNITY)
Admission: RE | Admit: 2013-10-14 | Discharge: 2013-10-14 | Disposition: A | Discharge status: Home / Self Care | Payer: Self-pay | Source: Ambulatory Visit | Attending: Cardiology | Admitting: Cardiology

## 2013-10-15 ENCOUNTER — Encounter: Payer: Self-pay | Admitting: Gastroenterology

## 2013-10-15 ENCOUNTER — Ambulatory Visit (INDEPENDENT_AMBULATORY_CARE_PROVIDER_SITE_OTHER): Payer: Medicare Other | Admitting: Gastroenterology

## 2013-10-15 ENCOUNTER — Telehealth: Payer: Self-pay | Admitting: Gastroenterology

## 2013-10-15 VITALS — BP 130/78 | HR 70 | Ht 66.0 in | Wt 167.2 lb

## 2013-10-15 DIAGNOSIS — R079 Chest pain, unspecified: Secondary | ICD-10-CM

## 2013-10-15 DIAGNOSIS — K2270 Barrett's esophagus without dysplasia: Secondary | ICD-10-CM

## 2013-10-15 MED ORDER — DEXLANSOPRAZOLE 60 MG PO CPDR: 60.0000 mg | DELAYED_RELEASE_CAPSULE | Freq: Every day | ORAL | Status: DC

## 2013-10-15 NOTE — Assessment & Plan Note (Signed)
Patient is very low-grade intermittent burning chest discomfort which most likely is due to  esophageal reflux.  Plan to empirically switched from Protonix to dexilant.  Patient was instructed to call back in 2 weeks.

## 2013-10-15 NOTE — Telephone Encounter (Signed)
Medication sent to phafrmacy

## 2013-10-15 NOTE — Patient Instructions (Signed)
Start Dexilant once a [place] Call back in 2 weeks to let us know how you are doing

## 2013-10-15 NOTE — Progress Notes (Signed)
          History of Present Illness:  73 year old white male with history of Barrett's esophagus here for evaluation of mild chest burning.  Despite taking daily Protonix he's developed very low-grade burning or l at least a warm sensation in his chest.  This occurs spontaneously.  He denies dysphagia or odynophagia.  Last endoscopy in May, 2014 was remarkable only for Barrett's.  Biopsies did not demonstrate dysplasia.    Review of Systems: He's had intermittent, rare hematuria.  Urology workup including cystoscopy and CT was negative.  Pertinent positive and negative review of systems were noted in the above HPI section. All other review of systems were otherwise negative.    Current Medications, Allergies, Past Medical History, Past Surgical History, Family History and Social History were reviewed in [facility] record  Vital signs were reviewed in today's medical record. Physical Exam: General: Well developed , well nourished, no acute distress  See Assessment and Plan under Problem List

## 2013-10-15 NOTE — Assessment & Plan Note (Signed)
May, 2014 endoscopy negative for dysplasia.  Plan followup endoscopy in 3 years

## 2013-10-18 ENCOUNTER — Encounter (HOSPITAL_COMMUNITY)
Admission: RE | Admit: 2013-10-18 | Discharge: 2013-10-18 | Disposition: A | Discharge status: Home / Self Care | Payer: Self-pay | Source: Ambulatory Visit | Attending: Cardiology | Admitting: Cardiology

## 2013-10-19 ENCOUNTER — Encounter (HOSPITAL_COMMUNITY): Payer: Medicare Other

## 2013-10-21 ENCOUNTER — Encounter (HOSPITAL_COMMUNITY)
Admission: RE | Admit: 2013-10-21 | Discharge: 2013-10-21 | Disposition: A | Discharge status: Home / Self Care | Payer: Self-pay | Source: Ambulatory Visit | Attending: Cardiology | Admitting: Cardiology

## 2013-10-25 ENCOUNTER — Encounter (HOSPITAL_COMMUNITY)
Admission: RE | Admit: 2013-10-25 | Discharge: 2013-10-25 | Disposition: A | Discharge status: Home / Self Care | Payer: Self-pay | Source: Ambulatory Visit | Attending: Cardiology | Admitting: Cardiology

## 2013-10-26 ENCOUNTER — Encounter (HOSPITAL_COMMUNITY): Payer: Medicare Other

## 2013-10-28 ENCOUNTER — Encounter (HOSPITAL_COMMUNITY): Payer: Medicare Other

## 2013-11-01 ENCOUNTER — Encounter (HOSPITAL_COMMUNITY)
Admission: RE | Admit: 2013-11-01 | Discharge: 2013-11-01 | Disposition: A | Discharge status: Home / Self Care | Payer: Self-pay | Source: Ambulatory Visit | Attending: Cardiology | Admitting: Cardiology

## 2013-11-01 DIAGNOSIS — Z7982 Long term (current) use of aspirin: Secondary | ICD-10-CM | POA: Insufficient documentation

## 2013-11-01 DIAGNOSIS — Z5189 Encounter for other specified aftercare: Secondary | ICD-10-CM | POA: Insufficient documentation

## 2013-11-01 DIAGNOSIS — I2510 Atherosclerotic heart disease of native coronary artery without angina pectoris: Secondary | ICD-10-CM | POA: Insufficient documentation

## 2013-11-01 DIAGNOSIS — I10 Essential (primary) hypertension: Secondary | ICD-10-CM | POA: Insufficient documentation

## 2013-11-01 DIAGNOSIS — Z9861 Coronary angioplasty status: Secondary | ICD-10-CM | POA: Insufficient documentation

## 2013-11-01 DIAGNOSIS — F17200 Nicotine dependence, unspecified, uncomplicated: Secondary | ICD-10-CM | POA: Insufficient documentation

## 2013-11-01 DIAGNOSIS — Z7902 Long term (current) use of antithrombotics/antiplatelets: Secondary | ICD-10-CM | POA: Insufficient documentation

## 2013-11-01 DIAGNOSIS — E785 Hyperlipidemia, unspecified: Secondary | ICD-10-CM | POA: Insufficient documentation

## 2013-11-02 ENCOUNTER — Encounter (HOSPITAL_COMMUNITY): Payer: Medicare Other

## 2013-11-04 ENCOUNTER — Encounter (HOSPITAL_COMMUNITY)
Admission: RE | Admit: 2013-11-04 | Discharge: 2013-11-04 | Disposition: A | Discharge status: Home / Self Care | Payer: Self-pay | Source: Ambulatory Visit | Attending: Cardiology | Admitting: Cardiology

## 2013-11-08 ENCOUNTER — Encounter (HOSPITAL_COMMUNITY)
Admission: RE | Admit: 2013-11-08 | Discharge: 2013-11-08 | Disposition: A | Discharge status: Home / Self Care | Payer: Self-pay | Source: Ambulatory Visit | Attending: Cardiology | Admitting: Cardiology

## 2013-11-09 ENCOUNTER — Encounter (HOSPITAL_COMMUNITY)
Admission: RE | Admit: 2013-11-09 | Discharge: 2013-11-09 | Disposition: A | Discharge status: Home / Self Care | Payer: Self-pay | Source: Ambulatory Visit | Attending: Cardiology | Admitting: Cardiology

## 2013-11-11 ENCOUNTER — Encounter (HOSPITAL_COMMUNITY)
Admission: RE | Admit: 2013-11-11 | Discharge: 2013-11-11 | Disposition: A | Discharge status: Home / Self Care | Payer: Self-pay | Source: Ambulatory Visit | Attending: Cardiology | Admitting: Cardiology

## 2013-11-15 ENCOUNTER — Encounter (HOSPITAL_COMMUNITY)
Admission: RE | Admit: 2013-11-15 | Discharge: 2013-11-15 | Disposition: A | Discharge status: Home / Self Care | Payer: Self-pay | Source: Ambulatory Visit | Attending: Cardiology | Admitting: Cardiology

## 2013-11-16 ENCOUNTER — Encounter (HOSPITAL_COMMUNITY)
Admission: RE | Admit: 2013-11-16 | Discharge: 2013-11-16 | Disposition: A | Discharge status: Home / Self Care | Payer: Self-pay | Source: Ambulatory Visit | Attending: Cardiology | Admitting: Cardiology

## 2013-11-18 ENCOUNTER — Encounter (HOSPITAL_COMMUNITY)
Admission: RE | Admit: 2013-11-18 | Discharge: 2013-11-18 | Disposition: A | Discharge status: Home / Self Care | Payer: Self-pay | Source: Ambulatory Visit | Attending: Cardiology | Admitting: Cardiology

## 2013-11-22 ENCOUNTER — Encounter: Payer: Self-pay | Admitting: Pulmonary Disease

## 2013-11-22 ENCOUNTER — Ambulatory Visit (INDEPENDENT_AMBULATORY_CARE_PROVIDER_SITE_OTHER): Payer: Medicare Other | Admitting: Pulmonary Disease

## 2013-11-22 ENCOUNTER — Encounter (HOSPITAL_COMMUNITY): Payer: Medicare Other

## 2013-11-22 VITALS — BP 108/76 | HR 78 | Temp 98.1°F | Ht 66.0 in | Wt 173.2 lb

## 2013-11-22 DIAGNOSIS — J438 Other emphysema: Secondary | ICD-10-CM

## 2013-11-22 DIAGNOSIS — R918 Other nonspecific abnormal finding of lung field: Secondary | ICD-10-CM | POA: Insufficient documentation

## 2013-11-22 DIAGNOSIS — J439 Emphysema, unspecified: Secondary | ICD-10-CM

## 2013-11-22 LAB — PULMONARY FUNCTION TEST

## 2013-11-22 NOTE — Patient Instructions (Signed)
No change in pulmonary meds. Try zyrtec 10mg  one a [place] for your allergies, as well as mucinex. Stop smoking. Will review your chest ct and give you some feedback. followup with me again in 25mos.

## 2013-11-22 NOTE — Progress Notes (Signed)
   Subjective:    Patient ID: Brad Turner, male    DOB: June 25, 1941, 73 y.o.   MRN: [id number]  HPI Patient comes in today for followup of his known COPD. Overall, he is fairly stable from a pulmonary standpoint, and unfortunately continues to smoke. He is now in a study with nebulized glycopyrrolate, where he may receive one of 2 doses or placebo. He is able to continue on his Symbicort. The patient tells me that he is been having a lot of nasal and ear symptoms, as well as postnasal drip and sore throat. I've asked him to try an over-the-counter antihistamine and also Mucinex. He denies any chest congestion or purulence. Finally, the patient tells me that he has been found to have pulmonary nodules on a recent CT of his abdomen. He has had a followup chest CT as well.   Review of Systems  Constitutional: Negative for fever and unexpected weight change.  HENT: Positive for congestion, postnasal drip, rhinorrhea, sneezing and sore throat. Negative for dental problem, ear pain, nosebleeds, sinus pressure and trouble swallowing.   Eyes: Negative for redness and itching.  Respiratory: Positive for cough and shortness of breath. Negative for chest tightness and wheezing.   Cardiovascular: Negative for palpitations and leg swelling.  Gastrointestinal: Negative for nausea and vomiting.  Genitourinary: Negative for dysuria.  Musculoskeletal: Negative for joint swelling.  Skin: Negative for rash.  Neurological: Negative for headaches.  Hematological: Does not bruise/bleed easily.  Psychiatric/Behavioral: Negative for dysphoric mood. The patient is not nervous/anxious.        Objective:   Physical Exam Well-developed male in no acute distress Nose without purulence or discharge noted Neck without lymphadenopathy or thyromegaly Chest with mildly decreased breath sounds, no wheezes or rhonchi Cardiac exam with regular rate and rhythm Lower extremities with trace edema, no cyanosis Alert and  oriented, moves all 4 extremities.       Assessment & Plan:

## 2013-11-22 NOTE — Assessment & Plan Note (Signed)
The patient appears to be stable from a pulmonary standpoint currently. I have stressed to him the importance of total smoking cessation, and that he will be at risk for acute exacerbation and progressive symptoms if he does not. I would like for him to stay on Symbicort, in addition to his study medication.

## 2013-11-23 ENCOUNTER — Encounter (HOSPITAL_COMMUNITY): Payer: Medicare Other

## 2013-11-23 NOTE — Assessment & Plan Note (Signed)
The patient has had a recent CT chest in January of this year which shows complete stability of his left lung nodules from 2011. Therefore, no further followup is needed.

## 2013-11-25 ENCOUNTER — Encounter (HOSPITAL_COMMUNITY)
Admission: RE | Admit: 2013-11-25 | Discharge: 2013-11-25 | Disposition: A | Discharge status: Home / Self Care | Payer: Self-pay | Source: Ambulatory Visit | Attending: Cardiology | Admitting: Cardiology

## 2013-11-29 ENCOUNTER — Encounter (HOSPITAL_COMMUNITY): Payer: Medicare Other

## 2013-11-29 ENCOUNTER — Telehealth: Payer: Self-pay | Admitting: Pulmonary Disease

## 2013-11-29 MED ORDER — CEFDINIR 300 MG PO CAPS: 600.0000 mg | ORAL_CAPSULE | Freq: Every day | ORAL | Status: DC

## 2013-11-29 NOTE — Telephone Encounter (Signed)
lmomtcb x1 for pt 

## 2013-11-29 NOTE — Telephone Encounter (Signed)
Last OV 11/22/13. I spoke with the pt and he states since last visit he has been having increased chest congestion, productive cough with yellow phlegm, increased SOB, and wheezing. He is taking zyrtec as directed.   Pt is requesting an rx for a zpak. Please advise.  Bing, CMA Allergies  Allergen Reactions  . Ivp Dye [Iodinated Diagnostic Agents] Hives and Shortness Of Breath  . Nitrofurantoin Other (See Comments)    Fever that lasted several months

## 2013-11-29 NOTE — Telephone Encounter (Signed)
Pt is aware of KC's recs. Rx has been sent in. Nothing further was needed.

## 2013-11-29 NOTE — Telephone Encounter (Signed)
Ok to call in [place] 300mg , take 2 each am for next 5 days.

## 2013-11-29 NOTE — Telephone Encounter (Signed)
Dr Gwenette Greet, Pt dropped off some PFTs for you to review.  Please advise thanks.

## 2013-11-30 ENCOUNTER — Encounter (HOSPITAL_COMMUNITY): Payer: Medicare Other

## 2013-12-02 ENCOUNTER — Encounter (HOSPITAL_COMMUNITY)
Admission: RE | Admit: 2013-12-02 | Discharge: 2013-12-02 | Disposition: A | Discharge status: Home / Self Care | Payer: Self-pay | Source: Ambulatory Visit | Attending: Cardiology | Admitting: Cardiology

## 2013-12-02 DIAGNOSIS — I2510 Atherosclerotic heart disease of native coronary artery without angina pectoris: Secondary | ICD-10-CM | POA: Insufficient documentation

## 2013-12-02 DIAGNOSIS — F17200 Nicotine dependence, unspecified, uncomplicated: Secondary | ICD-10-CM | POA: Insufficient documentation

## 2013-12-02 DIAGNOSIS — E785 Hyperlipidemia, unspecified: Secondary | ICD-10-CM | POA: Insufficient documentation

## 2013-12-02 DIAGNOSIS — Z7982 Long term (current) use of aspirin: Secondary | ICD-10-CM | POA: Insufficient documentation

## 2013-12-02 DIAGNOSIS — I10 Essential (primary) hypertension: Secondary | ICD-10-CM | POA: Insufficient documentation

## 2013-12-02 DIAGNOSIS — Z9861 Coronary angioplasty status: Secondary | ICD-10-CM | POA: Insufficient documentation

## 2013-12-02 DIAGNOSIS — Z7902 Long term (current) use of antithrombotics/antiplatelets: Secondary | ICD-10-CM | POA: Insufficient documentation

## 2013-12-02 DIAGNOSIS — Z5189 Encounter for other specified aftercare: Secondary | ICD-10-CM | POA: Insufficient documentation

## 2013-12-02 NOTE — Telephone Encounter (Signed)
Dr Gwenette Greet did not specify if anything needed to be done with these PFT results. They are now in his scan folder. Nothing documented in this message after your review of results--Please advise Dr Gwenette Greet is anything needs to be done. Thanks.

## 2013-12-02 NOTE — Telephone Encounter (Signed)
Has this message been taken care of? Thanks.  

## 2013-12-03 NOTE — Telephone Encounter (Signed)
If I put them in the scan folder, and did not send a message, then nothing else has to be done with them.

## 2013-12-06 ENCOUNTER — Encounter (HOSPITAL_COMMUNITY)
Admission: RE | Admit: 2013-12-06 | Discharge: 2013-12-06 | Disposition: A | Discharge status: Home / Self Care | Payer: Self-pay | Source: Ambulatory Visit | Attending: Cardiology | Admitting: Cardiology

## 2013-12-07 ENCOUNTER — Encounter (HOSPITAL_COMMUNITY)
Admission: RE | Admit: 2013-12-07 | Discharge: 2013-12-07 | Disposition: A | Discharge status: Home / Self Care | Payer: Self-pay | Source: Ambulatory Visit | Attending: Cardiology | Admitting: Cardiology

## 2013-12-09 ENCOUNTER — Encounter (HOSPITAL_COMMUNITY)
Admission: RE | Admit: 2013-12-09 | Discharge: 2013-12-09 | Disposition: A | Discharge status: Home / Self Care | Payer: Self-pay | Source: Ambulatory Visit | Attending: Cardiology | Admitting: Cardiology

## 2013-12-13 ENCOUNTER — Encounter (HOSPITAL_COMMUNITY): Payer: Medicare Other

## 2013-12-14 ENCOUNTER — Encounter (HOSPITAL_COMMUNITY)
Admission: RE | Admit: 2013-12-14 | Discharge: 2013-12-14 | Disposition: A | Discharge status: Home / Self Care | Payer: Self-pay | Source: Ambulatory Visit | Attending: Cardiology | Admitting: Cardiology

## 2013-12-16 ENCOUNTER — Encounter (HOSPITAL_COMMUNITY)
Admission: RE | Admit: 2013-12-16 | Discharge: 2013-12-16 | Disposition: A | Discharge status: Home / Self Care | Payer: Self-pay | Source: Ambulatory Visit | Attending: Cardiology | Admitting: Cardiology

## 2013-12-20 ENCOUNTER — Encounter (HOSPITAL_COMMUNITY)
Admission: RE | Admit: 2013-12-20 | Discharge: 2013-12-20 | Disposition: A | Discharge status: Home / Self Care | Payer: Self-pay | Source: Ambulatory Visit | Attending: Cardiology | Admitting: Cardiology

## 2013-12-21 ENCOUNTER — Encounter (HOSPITAL_COMMUNITY)
Admission: RE | Admit: 2013-12-21 | Discharge: 2013-12-21 | Disposition: A | Discharge status: Home / Self Care | Payer: Self-pay | Source: Ambulatory Visit | Attending: Cardiology | Admitting: Cardiology

## 2013-12-23 ENCOUNTER — Encounter (HOSPITAL_COMMUNITY)
Admission: RE | Admit: 2013-12-23 | Discharge: 2013-12-23 | Disposition: A | Discharge status: Home / Self Care | Payer: Self-pay | Source: Ambulatory Visit | Attending: Cardiology | Admitting: Cardiology

## 2013-12-27 ENCOUNTER — Encounter (HOSPITAL_COMMUNITY)
Admission: RE | Admit: 2013-12-27 | Discharge: 2013-12-27 | Disposition: A | Discharge status: Home / Self Care | Payer: Self-pay | Source: Ambulatory Visit | Attending: Cardiology | Admitting: Cardiology

## 2013-12-28 ENCOUNTER — Encounter (HOSPITAL_COMMUNITY)
Admission: RE | Admit: 2013-12-28 | Discharge: 2013-12-28 | Disposition: A | Discharge status: Home / Self Care | Payer: Self-pay | Source: Ambulatory Visit | Attending: Cardiology | Admitting: Cardiology

## 2013-12-30 ENCOUNTER — Encounter (HOSPITAL_COMMUNITY)
Admission: RE | Admit: 2013-12-30 | Discharge: 2013-12-30 | Disposition: A | Discharge status: Home / Self Care | Payer: Self-pay | Source: Ambulatory Visit | Attending: Cardiology | Admitting: Cardiology

## 2014-01-03 ENCOUNTER — Encounter (HOSPITAL_COMMUNITY)
Admission: RE | Admit: 2014-01-03 | Discharge: 2014-01-03 | Disposition: A | Discharge status: Home / Self Care | Payer: Self-pay | Source: Ambulatory Visit | Attending: Cardiology | Admitting: Cardiology

## 2014-01-03 DIAGNOSIS — Z5189 Encounter for other specified aftercare: Secondary | ICD-10-CM | POA: Insufficient documentation

## 2014-01-03 DIAGNOSIS — I2510 Atherosclerotic heart disease of native coronary artery without angina pectoris: Secondary | ICD-10-CM | POA: Insufficient documentation

## 2014-01-04 ENCOUNTER — Encounter (HOSPITAL_COMMUNITY)
Admission: RE | Admit: 2014-01-04 | Discharge: 2014-01-04 | Disposition: A | Discharge status: Home / Self Care | Payer: Self-pay | Source: Ambulatory Visit | Attending: Cardiology | Admitting: Cardiology

## 2014-01-06 ENCOUNTER — Encounter (HOSPITAL_COMMUNITY): Payer: Self-pay

## 2014-01-10 ENCOUNTER — Encounter (HOSPITAL_COMMUNITY)
Admission: RE | Admit: 2014-01-10 | Discharge: 2014-01-10 | Disposition: A | Discharge status: Home / Self Care | Payer: Self-pay | Source: Ambulatory Visit | Attending: Cardiology | Admitting: Cardiology

## 2014-01-11 ENCOUNTER — Encounter (HOSPITAL_COMMUNITY)
Admission: RE | Admit: 2014-01-11 | Discharge: 2014-01-11 | Disposition: A | Discharge status: Home / Self Care | Payer: Self-pay | Source: Ambulatory Visit | Attending: Cardiology | Admitting: Cardiology

## 2014-01-13 ENCOUNTER — Encounter (HOSPITAL_COMMUNITY)
Admission: RE | Admit: 2014-01-13 | Discharge: 2014-01-13 | Disposition: A | Discharge status: Home / Self Care | Payer: Self-pay | Source: Ambulatory Visit | Attending: Cardiology | Admitting: Cardiology

## 2014-01-17 ENCOUNTER — Encounter (HOSPITAL_COMMUNITY)
Admission: RE | Admit: 2014-01-17 | Discharge: 2014-01-17 | Disposition: A | Discharge status: Home / Self Care | Payer: Self-pay | Source: Ambulatory Visit | Attending: Cardiology | Admitting: Cardiology

## 2014-01-18 ENCOUNTER — Encounter (HOSPITAL_COMMUNITY)
Admission: RE | Admit: 2014-01-18 | Discharge: 2014-01-18 | Disposition: A | Discharge status: Home / Self Care | Payer: Self-pay | Source: Ambulatory Visit | Attending: Cardiology | Admitting: Cardiology

## 2014-01-20 ENCOUNTER — Encounter (HOSPITAL_COMMUNITY)
Admission: RE | Admit: 2014-01-20 | Discharge: 2014-01-20 | Disposition: A | Discharge status: Home / Self Care | Payer: Self-pay | Source: Ambulatory Visit | Attending: Cardiology | Admitting: Cardiology

## 2014-01-25 ENCOUNTER — Encounter (HOSPITAL_COMMUNITY)
Admission: RE | Admit: 2014-01-25 | Discharge: 2014-01-25 | Disposition: A | Discharge status: Home / Self Care | Payer: Self-pay | Source: Ambulatory Visit | Attending: Cardiology | Admitting: Cardiology

## 2014-01-27 ENCOUNTER — Encounter (HOSPITAL_COMMUNITY)
Admission: RE | Admit: 2014-01-27 | Discharge: 2014-01-27 | Disposition: A | Discharge status: Home / Self Care | Payer: Self-pay | Source: Ambulatory Visit | Attending: Cardiology | Admitting: Cardiology

## 2014-01-31 ENCOUNTER — Encounter (HOSPITAL_COMMUNITY): Payer: Self-pay

## 2014-01-31 DIAGNOSIS — I2510 Atherosclerotic heart disease of native coronary artery without angina pectoris: Secondary | ICD-10-CM | POA: Insufficient documentation

## 2014-01-31 DIAGNOSIS — Z5189 Encounter for other specified aftercare: Secondary | ICD-10-CM | POA: Insufficient documentation

## 2014-01-31 DIAGNOSIS — E785 Hyperlipidemia, unspecified: Secondary | ICD-10-CM | POA: Insufficient documentation

## 2014-02-01 ENCOUNTER — Encounter (HOSPITAL_COMMUNITY)
Admission: RE | Admit: 2014-02-01 | Discharge: 2014-02-01 | Disposition: A | Discharge status: Home / Self Care | Payer: Self-pay | Source: Ambulatory Visit | Attending: Cardiology | Admitting: Cardiology

## 2014-02-03 ENCOUNTER — Encounter (HOSPITAL_COMMUNITY)
Admission: RE | Admit: 2014-02-03 | Discharge: 2014-02-03 | Disposition: A | Discharge status: Home / Self Care | Payer: Self-pay | Source: Ambulatory Visit | Attending: Cardiology | Admitting: Cardiology

## 2014-02-07 ENCOUNTER — Encounter (HOSPITAL_COMMUNITY)
Admission: RE | Admit: 2014-02-07 | Discharge: 2014-02-07 | Disposition: A | Discharge status: Home / Self Care | Payer: Self-pay | Source: Ambulatory Visit | Attending: Cardiology | Admitting: Cardiology

## 2014-02-08 ENCOUNTER — Encounter (HOSPITAL_COMMUNITY)
Admission: RE | Admit: 2014-02-08 | Discharge: 2014-02-08 | Disposition: A | Discharge status: Home / Self Care | Payer: Self-pay | Source: Ambulatory Visit | Attending: Cardiology | Admitting: Cardiology

## 2014-02-10 ENCOUNTER — Other Ambulatory Visit: Payer: Self-pay | Admitting: Gastroenterology

## 2014-02-10 ENCOUNTER — Encounter (HOSPITAL_COMMUNITY)
Admission: RE | Admit: 2014-02-10 | Discharge: 2014-02-10 | Disposition: A | Discharge status: Home / Self Care | Payer: Self-pay | Source: Ambulatory Visit | Attending: Cardiology | Admitting: Cardiology

## 2014-02-14 ENCOUNTER — Encounter (HOSPITAL_COMMUNITY)
Admission: RE | Admit: 2014-02-14 | Discharge: 2014-02-14 | Disposition: A | Discharge status: Home / Self Care | Payer: Self-pay | Source: Ambulatory Visit | Attending: Cardiology | Admitting: Cardiology

## 2014-02-15 ENCOUNTER — Encounter (HOSPITAL_COMMUNITY)
Admission: RE | Admit: 2014-02-15 | Discharge: 2014-02-15 | Disposition: A | Discharge status: Home / Self Care | Payer: Self-pay | Source: Ambulatory Visit | Attending: Cardiology | Admitting: Cardiology

## 2014-02-17 ENCOUNTER — Encounter (HOSPITAL_COMMUNITY)
Admission: RE | Admit: 2014-02-17 | Discharge: 2014-02-17 | Disposition: A | Discharge status: Home / Self Care | Payer: Self-pay | Source: Ambulatory Visit | Attending: Cardiology | Admitting: Cardiology

## 2014-02-21 ENCOUNTER — Encounter (HOSPITAL_COMMUNITY)
Admission: RE | Admit: 2014-02-21 | Discharge: 2014-02-21 | Disposition: A | Discharge status: Home / Self Care | Payer: Self-pay | Source: Ambulatory Visit | Attending: Cardiology | Admitting: Cardiology

## 2014-02-22 ENCOUNTER — Encounter (HOSPITAL_COMMUNITY)
Admission: RE | Admit: 2014-02-22 | Discharge: 2014-02-22 | Disposition: A | Discharge status: Home / Self Care | Payer: Self-pay | Source: Ambulatory Visit | Attending: Cardiology | Admitting: Cardiology

## 2014-02-24 ENCOUNTER — Encounter (HOSPITAL_COMMUNITY): Payer: Self-pay

## 2014-02-28 ENCOUNTER — Encounter (HOSPITAL_COMMUNITY): Payer: Self-pay

## 2014-03-01 ENCOUNTER — Encounter (HOSPITAL_COMMUNITY): Payer: Self-pay

## 2014-03-03 ENCOUNTER — Encounter (HOSPITAL_COMMUNITY)
Admission: RE | Admit: 2014-03-03 | Discharge: 2014-03-03 | Disposition: A | Discharge status: Home / Self Care | Payer: Self-pay | Source: Ambulatory Visit | Attending: Cardiology | Admitting: Cardiology

## 2014-03-03 DIAGNOSIS — E785 Hyperlipidemia, unspecified: Secondary | ICD-10-CM | POA: Insufficient documentation

## 2014-03-03 DIAGNOSIS — I2510 Atherosclerotic heart disease of native coronary artery without angina pectoris: Secondary | ICD-10-CM | POA: Insufficient documentation

## 2014-03-03 DIAGNOSIS — Z5189 Encounter for other specified aftercare: Secondary | ICD-10-CM | POA: Insufficient documentation

## 2014-03-07 ENCOUNTER — Encounter (HOSPITAL_COMMUNITY): Payer: Self-pay

## 2014-03-08 ENCOUNTER — Encounter (HOSPITAL_COMMUNITY)
Admission: RE | Admit: 2014-03-08 | Discharge: 2014-03-08 | Disposition: A | Discharge status: Home / Self Care | Payer: Self-pay | Source: Ambulatory Visit | Attending: Cardiology | Admitting: Cardiology

## 2014-03-10 ENCOUNTER — Encounter (HOSPITAL_COMMUNITY)
Admission: RE | Admit: 2014-03-10 | Discharge: 2014-03-10 | Disposition: A | Discharge status: Home / Self Care | Payer: Self-pay | Source: Ambulatory Visit | Attending: Cardiology | Admitting: Cardiology

## 2014-03-12 ENCOUNTER — Other Ambulatory Visit: Payer: Self-pay | Admitting: Gastroenterology

## 2014-03-14 ENCOUNTER — Encounter (HOSPITAL_COMMUNITY): Payer: Self-pay

## 2014-03-15 ENCOUNTER — Encounter (HOSPITAL_COMMUNITY)
Admission: RE | Admit: 2014-03-15 | Discharge: 2014-03-15 | Disposition: A | Discharge status: Home / Self Care | Payer: Self-pay | Source: Ambulatory Visit | Attending: Cardiology | Admitting: Cardiology

## 2014-03-17 ENCOUNTER — Encounter (HOSPITAL_COMMUNITY)
Admission: RE | Admit: 2014-03-17 | Discharge: 2014-03-17 | Disposition: A | Discharge status: Home / Self Care | Payer: Self-pay | Source: Ambulatory Visit | Attending: Cardiology | Admitting: Cardiology

## 2014-03-21 ENCOUNTER — Encounter (HOSPITAL_COMMUNITY)
Admission: RE | Admit: 2014-03-21 | Discharge: 2014-03-21 | Disposition: A | Discharge status: Home / Self Care | Payer: Self-pay | Source: Ambulatory Visit | Attending: Cardiology | Admitting: Cardiology

## 2014-03-22 ENCOUNTER — Encounter (HOSPITAL_COMMUNITY): Payer: Self-pay

## 2014-03-24 ENCOUNTER — Encounter (HOSPITAL_COMMUNITY): Payer: Self-pay

## 2014-03-28 ENCOUNTER — Encounter (HOSPITAL_COMMUNITY)
Admission: RE | Admit: 2014-03-28 | Discharge: 2014-03-28 | Disposition: A | Discharge status: Home / Self Care | Payer: Self-pay | Source: Ambulatory Visit | Attending: Cardiology | Admitting: Cardiology

## 2014-03-29 ENCOUNTER — Encounter (HOSPITAL_COMMUNITY)
Admission: RE | Admit: 2014-03-29 | Discharge: 2014-03-29 | Disposition: A | Discharge status: Home / Self Care | Payer: Self-pay | Source: Ambulatory Visit | Attending: Cardiology | Admitting: Cardiology

## 2014-03-31 ENCOUNTER — Encounter (HOSPITAL_COMMUNITY): Payer: Self-pay

## 2014-04-04 ENCOUNTER — Encounter (HOSPITAL_COMMUNITY)
Admission: RE | Admit: 2014-04-04 | Discharge: 2014-04-04 | Disposition: A | Discharge status: Home / Self Care | Payer: Self-pay | Source: Ambulatory Visit | Attending: Cardiology | Admitting: Cardiology

## 2014-04-04 DIAGNOSIS — I2510 Atherosclerotic heart disease of native coronary artery without angina pectoris: Secondary | ICD-10-CM | POA: Insufficient documentation

## 2014-04-04 DIAGNOSIS — Z5189 Encounter for other specified aftercare: Secondary | ICD-10-CM | POA: Insufficient documentation

## 2014-04-04 DIAGNOSIS — E785 Hyperlipidemia, unspecified: Secondary | ICD-10-CM | POA: Insufficient documentation

## 2014-04-05 ENCOUNTER — Encounter (HOSPITAL_COMMUNITY)
Admission: RE | Admit: 2014-04-05 | Discharge: 2014-04-05 | Disposition: A | Discharge status: Home / Self Care | Payer: Self-pay | Source: Ambulatory Visit | Attending: Cardiology | Admitting: Cardiology

## 2014-04-07 ENCOUNTER — Encounter (HOSPITAL_COMMUNITY): Payer: Self-pay

## 2014-04-09 ENCOUNTER — Other Ambulatory Visit: Payer: Self-pay | Admitting: Gastroenterology

## 2014-04-11 ENCOUNTER — Encounter (HOSPITAL_COMMUNITY)
Admission: RE | Admit: 2014-04-11 | Discharge: 2014-04-11 | Disposition: A | Discharge status: Home / Self Care | Payer: Self-pay | Source: Ambulatory Visit | Attending: Cardiology | Admitting: Cardiology

## 2014-04-12 ENCOUNTER — Encounter (HOSPITAL_COMMUNITY): Payer: Self-pay

## 2014-04-14 ENCOUNTER — Encounter (HOSPITAL_COMMUNITY)
Admission: RE | Admit: 2014-04-14 | Discharge: 2014-04-14 | Disposition: A | Discharge status: Home / Self Care | Payer: Self-pay | Source: Ambulatory Visit | Attending: Cardiology | Admitting: Cardiology

## 2014-04-18 ENCOUNTER — Encounter (HOSPITAL_COMMUNITY)
Admission: RE | Admit: 2014-04-18 | Discharge: 2014-04-18 | Disposition: A | Discharge status: Home / Self Care | Payer: Self-pay | Source: Ambulatory Visit | Attending: Cardiology | Admitting: Cardiology

## 2014-04-19 ENCOUNTER — Encounter (HOSPITAL_COMMUNITY)
Admission: RE | Admit: 2014-04-19 | Discharge: 2014-04-19 | Disposition: A | Discharge status: Home / Self Care | Payer: Self-pay | Source: Ambulatory Visit | Attending: Cardiology | Admitting: Cardiology

## 2014-04-19 ENCOUNTER — Other Ambulatory Visit: Payer: Self-pay | Admitting: Pulmonary Disease

## 2014-04-21 ENCOUNTER — Encounter (HOSPITAL_COMMUNITY)
Admission: RE | Admit: 2014-04-21 | Discharge: 2014-04-21 | Disposition: A | Discharge status: Home / Self Care | Payer: Self-pay | Source: Ambulatory Visit | Attending: Cardiology | Admitting: Cardiology

## 2014-04-25 ENCOUNTER — Encounter (HOSPITAL_COMMUNITY): Payer: Self-pay

## 2014-04-26 ENCOUNTER — Encounter (HOSPITAL_COMMUNITY): Payer: Self-pay

## 2014-04-28 ENCOUNTER — Encounter (HOSPITAL_COMMUNITY)
Admission: RE | Admit: 2014-04-28 | Discharge: 2014-04-28 | Disposition: A | Discharge status: Home / Self Care | Payer: Self-pay | Source: Ambulatory Visit | Attending: Cardiology | Admitting: Cardiology

## 2014-04-28 ENCOUNTER — Ambulatory Visit
Admission: RE | Admit: 2014-04-28 | Discharge: 2014-04-28 | Disposition: A | Discharge status: Home / Self Care | Payer: No Typology Code available for payment source | Source: Ambulatory Visit | Attending: Family Medicine | Admitting: Family Medicine

## 2014-04-28 ENCOUNTER — Other Ambulatory Visit: Payer: Self-pay | Admitting: Family Medicine

## 2014-04-28 DIAGNOSIS — Z006 Encounter for examination for normal comparison and control in clinical research program: Secondary | ICD-10-CM

## 2014-05-02 ENCOUNTER — Encounter (HOSPITAL_COMMUNITY)
Admission: RE | Admit: 2014-05-02 | Discharge: 2014-05-02 | Disposition: A | Discharge status: Home / Self Care | Payer: Medicare Other | Source: Ambulatory Visit | Attending: Cardiology | Admitting: Cardiology

## 2014-05-03 ENCOUNTER — Encounter (HOSPITAL_COMMUNITY)
Admission: RE | Admit: 2014-05-03 | Discharge: 2014-05-03 | Disposition: A | Discharge status: Home / Self Care | Payer: Self-pay | Source: Ambulatory Visit | Attending: Cardiology | Admitting: Cardiology

## 2014-05-03 DIAGNOSIS — E785 Hyperlipidemia, unspecified: Secondary | ICD-10-CM | POA: Insufficient documentation

## 2014-05-03 DIAGNOSIS — Z5189 Encounter for other specified aftercare: Secondary | ICD-10-CM | POA: Insufficient documentation

## 2014-05-03 DIAGNOSIS — I2510 Atherosclerotic heart disease of native coronary artery without angina pectoris: Secondary | ICD-10-CM | POA: Insufficient documentation

## 2014-05-05 ENCOUNTER — Encounter (HOSPITAL_COMMUNITY)
Admission: RE | Admit: 2014-05-05 | Discharge: 2014-05-05 | Disposition: A | Discharge status: Home / Self Care | Payer: Self-pay | Source: Ambulatory Visit | Attending: Cardiology | Admitting: Cardiology

## 2014-05-10 ENCOUNTER — Encounter (HOSPITAL_COMMUNITY)
Admission: RE | Admit: 2014-05-10 | Discharge: 2014-05-10 | Disposition: A | Discharge status: Home / Self Care | Payer: Self-pay | Source: Ambulatory Visit | Attending: Cardiology | Admitting: Cardiology

## 2014-05-12 ENCOUNTER — Encounter (HOSPITAL_COMMUNITY)
Admission: RE | Admit: 2014-05-12 | Discharge: 2014-05-12 | Disposition: A | Discharge status: Home / Self Care | Payer: Self-pay | Source: Ambulatory Visit | Attending: Cardiology | Admitting: Cardiology

## 2014-05-14 ENCOUNTER — Other Ambulatory Visit: Payer: Self-pay | Admitting: Gastroenterology

## 2014-05-16 ENCOUNTER — Encounter (HOSPITAL_COMMUNITY)
Admission: RE | Admit: 2014-05-16 | Discharge: 2014-05-16 | Disposition: A | Discharge status: Home / Self Care | Payer: Self-pay | Source: Ambulatory Visit | Attending: Cardiology | Admitting: Cardiology

## 2014-05-17 ENCOUNTER — Encounter (HOSPITAL_COMMUNITY)
Admission: RE | Admit: 2014-05-17 | Discharge: 2014-05-17 | Disposition: A | Discharge status: Home / Self Care | Payer: Self-pay | Source: Ambulatory Visit | Attending: Cardiology | Admitting: Cardiology

## 2014-05-19 ENCOUNTER — Encounter (HOSPITAL_COMMUNITY): Payer: Self-pay

## 2014-05-23 ENCOUNTER — Encounter (HOSPITAL_COMMUNITY): Payer: Self-pay

## 2014-05-24 ENCOUNTER — Ambulatory Visit: Payer: Medicare Other | Admitting: Pulmonary Disease

## 2014-05-24 ENCOUNTER — Ambulatory Visit (INDEPENDENT_AMBULATORY_CARE_PROVIDER_SITE_OTHER): Payer: Medicare Other

## 2014-05-24 ENCOUNTER — Ambulatory Visit: Payer: Medicare Other

## 2014-05-24 ENCOUNTER — Encounter: Payer: Self-pay | Admitting: Pulmonary Disease

## 2014-05-24 ENCOUNTER — Encounter (HOSPITAL_COMMUNITY)
Admission: RE | Admit: 2014-05-24 | Discharge: 2014-05-24 | Disposition: A | Discharge status: Home / Self Care | Payer: Self-pay | Source: Ambulatory Visit | Attending: Cardiology | Admitting: Cardiology

## 2014-05-24 DIAGNOSIS — Z23 Encounter for immunization: Secondary | ICD-10-CM

## 2014-05-26 ENCOUNTER — Encounter (HOSPITAL_COMMUNITY)
Admission: RE | Admit: 2014-05-26 | Discharge: 2014-05-26 | Disposition: A | Discharge status: Home / Self Care | Payer: Self-pay | Source: Ambulatory Visit | Attending: Cardiology | Admitting: Cardiology

## 2014-05-30 ENCOUNTER — Encounter (HOSPITAL_COMMUNITY)
Admission: RE | Admit: 2014-05-30 | Discharge: 2014-05-30 | Disposition: A | Discharge status: Home / Self Care | Payer: Self-pay | Source: Ambulatory Visit | Attending: Cardiology | Admitting: Cardiology

## 2014-05-31 ENCOUNTER — Encounter (HOSPITAL_COMMUNITY)
Admission: RE | Admit: 2014-05-31 | Discharge: 2014-05-31 | Disposition: A | Discharge status: Home / Self Care | Payer: Self-pay | Source: Ambulatory Visit | Attending: Cardiology | Admitting: Cardiology

## 2014-06-02 ENCOUNTER — Encounter (HOSPITAL_COMMUNITY)
Admission: RE | Admit: 2014-06-02 | Discharge: 2014-06-02 | Disposition: A | Discharge status: Home / Self Care | Payer: Self-pay | Source: Ambulatory Visit | Attending: Cardiology | Admitting: Cardiology

## 2014-06-02 DIAGNOSIS — J438 Other emphysema: Secondary | ICD-10-CM | POA: Insufficient documentation

## 2014-06-06 ENCOUNTER — Encounter (HOSPITAL_COMMUNITY): Payer: Self-pay

## 2014-06-07 ENCOUNTER — Encounter (HOSPITAL_COMMUNITY)
Admission: RE | Admit: 2014-06-07 | Discharge: 2014-06-07 | Disposition: A | Discharge status: Home / Self Care | Payer: Self-pay | Source: Ambulatory Visit | Attending: Cardiology | Admitting: Cardiology

## 2014-06-09 ENCOUNTER — Encounter (HOSPITAL_COMMUNITY): Payer: Self-pay

## 2014-06-13 ENCOUNTER — Encounter (HOSPITAL_COMMUNITY)
Admission: RE | Admit: 2014-06-13 | Discharge: 2014-06-13 | Disposition: A | Discharge status: Home / Self Care | Payer: Self-pay | Source: Ambulatory Visit | Attending: Cardiology | Admitting: Cardiology

## 2014-06-14 ENCOUNTER — Other Ambulatory Visit: Payer: Self-pay | Admitting: Gastroenterology

## 2014-06-14 ENCOUNTER — Ambulatory Visit (INDEPENDENT_AMBULATORY_CARE_PROVIDER_SITE_OTHER): Payer: Medicare Other | Admitting: Pulmonary Disease

## 2014-06-14 ENCOUNTER — Encounter: Payer: Self-pay | Admitting: Pulmonary Disease

## 2014-06-14 ENCOUNTER — Encounter (HOSPITAL_COMMUNITY)
Admission: RE | Admit: 2014-06-14 | Discharge: 2014-06-14 | Disposition: A | Discharge status: Home / Self Care | Payer: Self-pay | Source: Ambulatory Visit | Attending: Cardiology | Admitting: Cardiology

## 2014-06-14 VITALS — BP 104/66 | HR 76 | Temp 97.4°F | Ht 66.0 in | Wt 166.2 lb

## 2014-06-14 DIAGNOSIS — J438 Other emphysema: Secondary | ICD-10-CM

## 2014-06-14 NOTE — Progress Notes (Signed)
   Subjective:    Patient ID: Brad Turner, male    DOB: April 07, 1941, 73 y.o.   MRN: [id number]  HPI The pt comes in today for f/u of his known copd.  He unfortunately continues to smoke, but has not had a recent acute exacerbation.  He is in a different drug study for his copd now, randomized to symbicort alone but in a different delivery device per his history.  He has seen increased mucus production, but again is still smoking.  He is not on an anticholinergic currently.  He has already had his flu shot this year.    Review of Systems  Constitutional: Negative for fever and unexpected weight change.  HENT: Positive for congestion and postnasal drip. Negative for dental problem, ear pain, nosebleeds, rhinorrhea, sinus pressure, sneezing, sore throat and trouble swallowing.   Eyes: Negative for redness and itching.  Respiratory: Positive for cough and shortness of breath. Negative for chest tightness and wheezing.   Cardiovascular: Negative for palpitations and leg swelling.  Gastrointestinal: Negative for nausea and vomiting.  Genitourinary: Negative for dysuria.  Musculoskeletal: Negative for joint swelling.  Skin: Negative for rash.  Neurological: Negative for headaches.  Hematological: Does not bruise/bleed easily.  Psychiatric/Behavioral: Negative for dysphoric mood. The patient is not nervous/anxious.        Objective:   Physical Exam Wd male in nad Nose without purulence or d/c noted Neck without LN or tmg Chest with a few rhonchi, no wheezing, adequate airflow. Cor with rrr LE with minimal ankle edema Alert and oriented, moves all 4.        Assessment & Plan:

## 2014-06-14 NOTE — Patient Instructions (Signed)
Continue on your study medication, but let us know if you feel you are doing poorly. Stop smoking.  This is your most important treatment. followup with me again in 35mos.

## 2014-06-14 NOTE — Assessment & Plan Note (Signed)
The pt is near his usual baseline on his current study medication.  He is not on an anticholinergic, and is seeing some increased mucus production.  However, there is no purulence or increased sob.  I have stressed to him again the importance of total smoking cessation.  He has already had a flu shot this year.

## 2014-06-16 ENCOUNTER — Encounter (HOSPITAL_COMMUNITY)
Admission: RE | Admit: 2014-06-16 | Discharge: 2014-06-16 | Disposition: A | Discharge status: Home / Self Care | Payer: Self-pay | Source: Ambulatory Visit | Attending: Cardiology | Admitting: Cardiology

## 2014-06-20 ENCOUNTER — Encounter (HOSPITAL_COMMUNITY)
Admission: RE | Admit: 2014-06-20 | Discharge: 2014-06-20 | Disposition: A | Discharge status: Home / Self Care | Payer: Self-pay | Source: Ambulatory Visit | Attending: Cardiology | Admitting: Cardiology

## 2014-06-21 ENCOUNTER — Encounter (HOSPITAL_COMMUNITY): Payer: Self-pay

## 2014-06-23 ENCOUNTER — Encounter (HOSPITAL_COMMUNITY)
Admission: RE | Admit: 2014-06-23 | Discharge: 2014-06-23 | Disposition: A | Discharge status: Home / Self Care | Payer: Self-pay | Source: Ambulatory Visit | Attending: Cardiology | Admitting: Cardiology

## 2014-06-27 ENCOUNTER — Encounter (HOSPITAL_COMMUNITY)
Admission: RE | Admit: 2014-06-27 | Discharge: 2014-06-27 | Disposition: A | Discharge status: Home / Self Care | Payer: Self-pay | Source: Ambulatory Visit | Attending: Cardiology | Admitting: Cardiology

## 2014-06-28 ENCOUNTER — Encounter (HOSPITAL_COMMUNITY): Payer: Self-pay

## 2014-06-30 ENCOUNTER — Encounter (HOSPITAL_COMMUNITY)
Admission: RE | Admit: 2014-06-30 | Discharge: 2014-06-30 | Disposition: A | Discharge status: Home / Self Care | Payer: Self-pay | Source: Ambulatory Visit | Attending: Cardiology | Admitting: Cardiology

## 2014-07-04 ENCOUNTER — Encounter (HOSPITAL_COMMUNITY)
Admission: RE | Admit: 2014-07-04 | Discharge: 2014-07-04 | Disposition: A | Discharge status: Home / Self Care | Payer: Self-pay | Source: Ambulatory Visit | Attending: Cardiology | Admitting: Cardiology

## 2014-07-04 DIAGNOSIS — I2510 Atherosclerotic heart disease of native coronary artery without angina pectoris: Secondary | ICD-10-CM | POA: Insufficient documentation

## 2014-07-04 DIAGNOSIS — E785 Hyperlipidemia, unspecified: Secondary | ICD-10-CM | POA: Insufficient documentation

## 2014-07-04 DIAGNOSIS — Z5189 Encounter for other specified aftercare: Secondary | ICD-10-CM | POA: Insufficient documentation

## 2014-07-05 ENCOUNTER — Encounter (HOSPITAL_COMMUNITY)
Admission: RE | Admit: 2014-07-05 | Discharge: 2014-07-05 | Disposition: A | Discharge status: Home / Self Care | Payer: Self-pay | Source: Ambulatory Visit | Attending: Cardiology | Admitting: Cardiology

## 2014-07-07 ENCOUNTER — Encounter (HOSPITAL_COMMUNITY): Payer: Self-pay

## 2014-07-11 ENCOUNTER — Encounter (HOSPITAL_COMMUNITY)
Admission: RE | Admit: 2014-07-11 | Discharge: 2014-07-11 | Disposition: A | Discharge status: Home / Self Care | Payer: Self-pay | Source: Ambulatory Visit | Attending: Cardiology | Admitting: Cardiology

## 2014-07-12 ENCOUNTER — Encounter (HOSPITAL_COMMUNITY)
Admission: RE | Admit: 2014-07-12 | Discharge: 2014-07-12 | Disposition: A | Discharge status: Home / Self Care | Payer: Self-pay | Source: Ambulatory Visit | Attending: Cardiology | Admitting: Cardiology

## 2014-07-14 ENCOUNTER — Encounter (HOSPITAL_COMMUNITY): Payer: Self-pay

## 2014-07-15 ENCOUNTER — Other Ambulatory Visit: Payer: Self-pay | Admitting: Gastroenterology

## 2014-07-18 ENCOUNTER — Encounter (HOSPITAL_COMMUNITY)
Admission: RE | Admit: 2014-07-18 | Discharge: 2014-07-18 | Disposition: A | Discharge status: Home / Self Care | Payer: Self-pay | Source: Ambulatory Visit | Attending: Cardiology | Admitting: Cardiology

## 2014-07-19 ENCOUNTER — Encounter (HOSPITAL_COMMUNITY): Payer: Self-pay

## 2014-07-21 ENCOUNTER — Encounter (HOSPITAL_COMMUNITY)
Admission: RE | Admit: 2014-07-21 | Discharge: 2014-07-21 | Disposition: A | Discharge status: Home / Self Care | Payer: Self-pay | Source: Ambulatory Visit | Attending: Cardiology | Admitting: Cardiology

## 2014-07-25 ENCOUNTER — Encounter (HOSPITAL_COMMUNITY)
Admission: RE | Admit: 2014-07-25 | Discharge: 2014-07-25 | Disposition: A | Discharge status: Home / Self Care | Payer: Self-pay | Source: Ambulatory Visit | Attending: Cardiology | Admitting: Cardiology

## 2014-07-26 ENCOUNTER — Encounter (HOSPITAL_COMMUNITY): Payer: Self-pay

## 2014-08-01 ENCOUNTER — Encounter (HOSPITAL_COMMUNITY)
Admission: RE | Admit: 2014-08-01 | Discharge: 2014-08-01 | Disposition: A | Discharge status: Home / Self Care | Payer: Self-pay | Source: Ambulatory Visit | Attending: Cardiology | Admitting: Cardiology

## 2014-08-02 ENCOUNTER — Encounter (HOSPITAL_COMMUNITY)
Admission: RE | Admit: 2014-08-02 | Discharge: 2014-08-02 | Disposition: A | Discharge status: Home / Self Care | Payer: Self-pay | Source: Ambulatory Visit | Attending: Cardiology | Admitting: Cardiology

## 2014-08-02 DIAGNOSIS — E785 Hyperlipidemia, unspecified: Secondary | ICD-10-CM | POA: Insufficient documentation

## 2014-08-02 DIAGNOSIS — I2510 Atherosclerotic heart disease of native coronary artery without angina pectoris: Secondary | ICD-10-CM | POA: Insufficient documentation

## 2014-08-02 DIAGNOSIS — Z5189 Encounter for other specified aftercare: Secondary | ICD-10-CM | POA: Insufficient documentation

## 2014-08-04 ENCOUNTER — Encounter (HOSPITAL_COMMUNITY)
Admission: RE | Admit: 2014-08-04 | Discharge: 2014-08-04 | Disposition: A | Discharge status: Home / Self Care | Payer: Self-pay | Source: Ambulatory Visit | Attending: Cardiology | Admitting: Cardiology

## 2014-08-08 ENCOUNTER — Encounter (HOSPITAL_COMMUNITY)
Admission: RE | Admit: 2014-08-08 | Discharge: 2014-08-08 | Disposition: A | Discharge status: Home / Self Care | Payer: Self-pay | Source: Ambulatory Visit | Attending: Cardiology | Admitting: Cardiology

## 2014-08-09 ENCOUNTER — Encounter (HOSPITAL_COMMUNITY)
Admission: RE | Admit: 2014-08-09 | Discharge: 2014-08-09 | Disposition: A | Discharge status: Home / Self Care | Payer: Self-pay | Source: Ambulatory Visit | Attending: Cardiology | Admitting: Cardiology

## 2014-08-11 ENCOUNTER — Encounter (HOSPITAL_COMMUNITY)
Admission: RE | Admit: 2014-08-11 | Discharge: 2014-08-11 | Disposition: A | Discharge status: Home / Self Care | Payer: Self-pay | Source: Ambulatory Visit | Attending: Cardiology | Admitting: Cardiology

## 2014-08-13 ENCOUNTER — Other Ambulatory Visit: Payer: Self-pay | Admitting: Gastroenterology

## 2014-08-15 ENCOUNTER — Encounter (HOSPITAL_COMMUNITY): Payer: Self-pay

## 2014-08-16 ENCOUNTER — Encounter (HOSPITAL_COMMUNITY)
Admission: RE | Admit: 2014-08-16 | Discharge: 2014-08-16 | Disposition: A | Discharge status: Home / Self Care | Payer: Self-pay | Source: Ambulatory Visit | Attending: Cardiology | Admitting: Cardiology

## 2014-08-18 ENCOUNTER — Encounter (HOSPITAL_COMMUNITY)
Admission: RE | Admit: 2014-08-18 | Discharge: 2014-08-18 | Disposition: A | Discharge status: Home / Self Care | Payer: Self-pay | Source: Ambulatory Visit | Attending: Cardiology | Admitting: Cardiology

## 2014-08-22 ENCOUNTER — Encounter (HOSPITAL_COMMUNITY): Payer: Self-pay

## 2014-08-23 ENCOUNTER — Encounter (HOSPITAL_COMMUNITY)
Admission: RE | Admit: 2014-08-23 | Discharge: 2014-08-23 | Disposition: A | Discharge status: Home / Self Care | Payer: Self-pay | Source: Ambulatory Visit | Attending: Cardiology | Admitting: Cardiology

## 2014-08-25 ENCOUNTER — Encounter (HOSPITAL_COMMUNITY): Payer: Self-pay

## 2014-08-29 ENCOUNTER — Encounter (HOSPITAL_COMMUNITY)
Admission: RE | Admit: 2014-08-29 | Discharge: 2014-08-29 | Disposition: A | Discharge status: Home / Self Care | Payer: Self-pay | Source: Ambulatory Visit | Attending: Cardiology | Admitting: Cardiology

## 2014-08-30 ENCOUNTER — Encounter (HOSPITAL_COMMUNITY)
Admission: RE | Admit: 2014-08-30 | Discharge: 2014-08-30 | Disposition: A | Discharge status: Home / Self Care | Payer: Self-pay | Source: Ambulatory Visit | Attending: Cardiology | Admitting: Cardiology

## 2014-09-01 ENCOUNTER — Encounter (HOSPITAL_COMMUNITY): Payer: Self-pay

## 2014-09-05 ENCOUNTER — Encounter (HOSPITAL_COMMUNITY)
Admission: RE | Admit: 2014-09-05 | Discharge: 2014-09-05 | Disposition: A | Discharge status: Home / Self Care | Payer: Self-pay | Source: Ambulatory Visit | Attending: Cardiology | Admitting: Cardiology

## 2014-09-05 DIAGNOSIS — I2510 Atherosclerotic heart disease of native coronary artery without angina pectoris: Secondary | ICD-10-CM | POA: Insufficient documentation

## 2014-09-05 DIAGNOSIS — E785 Hyperlipidemia, unspecified: Secondary | ICD-10-CM | POA: Insufficient documentation

## 2014-09-05 DIAGNOSIS — Z5189 Encounter for other specified aftercare: Secondary | ICD-10-CM | POA: Insufficient documentation

## 2014-09-06 ENCOUNTER — Encounter (HOSPITAL_COMMUNITY)
Admission: RE | Admit: 2014-09-06 | Discharge: 2014-09-06 | Disposition: A | Discharge status: Home / Self Care | Payer: Self-pay | Source: Ambulatory Visit | Attending: Cardiology | Admitting: Cardiology

## 2014-09-08 ENCOUNTER — Encounter (HOSPITAL_COMMUNITY): Payer: Self-pay

## 2014-09-11 ENCOUNTER — Other Ambulatory Visit: Payer: Self-pay | Admitting: Gastroenterology

## 2014-09-12 ENCOUNTER — Encounter (HOSPITAL_COMMUNITY): Payer: Self-pay

## 2014-09-13 ENCOUNTER — Encounter (HOSPITAL_COMMUNITY): Payer: Self-pay

## 2014-09-15 ENCOUNTER — Encounter (HOSPITAL_COMMUNITY)
Admission: RE | Admit: 2014-09-15 | Discharge: 2014-09-15 | Disposition: A | Discharge status: Home / Self Care | Payer: Self-pay | Source: Ambulatory Visit | Attending: Cardiology | Admitting: Cardiology

## 2014-09-19 ENCOUNTER — Encounter (HOSPITAL_COMMUNITY): Payer: Self-pay

## 2014-09-20 ENCOUNTER — Encounter (HOSPITAL_COMMUNITY)
Admission: RE | Admit: 2014-09-20 | Discharge: 2014-09-20 | Disposition: A | Discharge status: Home / Self Care | Payer: Self-pay | Source: Ambulatory Visit | Attending: Cardiology | Admitting: Cardiology

## 2014-09-22 ENCOUNTER — Encounter (HOSPITAL_COMMUNITY): Payer: Self-pay

## 2014-09-26 ENCOUNTER — Encounter (HOSPITAL_COMMUNITY)
Admission: RE | Admit: 2014-09-26 | Discharge: 2014-09-26 | Disposition: A | Discharge status: Home / Self Care | Payer: Self-pay | Source: Ambulatory Visit | Attending: Cardiology | Admitting: Cardiology

## 2014-09-27 ENCOUNTER — Encounter (HOSPITAL_COMMUNITY)
Admission: RE | Admit: 2014-09-27 | Discharge: 2014-09-27 | Disposition: A | Discharge status: Home / Self Care | Payer: Self-pay | Source: Ambulatory Visit | Attending: Cardiology | Admitting: Cardiology

## 2014-09-29 ENCOUNTER — Ambulatory Visit (INDEPENDENT_AMBULATORY_CARE_PROVIDER_SITE_OTHER): Payer: Medicare Other | Admitting: Internal Medicine

## 2014-09-29 ENCOUNTER — Encounter (HOSPITAL_COMMUNITY)
Admission: RE | Admit: 2014-09-29 | Discharge: 2014-09-29 | Disposition: A | Discharge status: Home / Self Care | Payer: Self-pay | Source: Ambulatory Visit | Attending: Cardiology | Admitting: Cardiology

## 2014-09-29 ENCOUNTER — Telehealth: Payer: Self-pay | Admitting: *Deleted

## 2014-09-29 ENCOUNTER — Encounter: Payer: Self-pay | Admitting: Internal Medicine

## 2014-09-29 VITALS — BP 138/64 | HR 88 | Temp 98.2°F | Ht 66.0 in | Wt 154.0 lb

## 2014-09-29 DIAGNOSIS — J441 Chronic obstructive pulmonary disease with (acute) exacerbation: Secondary | ICD-10-CM | POA: Insufficient documentation

## 2014-09-29 MED ORDER — DOXYCYCLINE HYCLATE 100 MG PO TABS: ORAL_TABLET | ORAL | Status: DC

## 2014-09-29 MED ORDER — PREDNISONE 10 MG PO TABS
ORAL_TABLET | ORAL | Status: DC
Start: 2014-09-29 — End: 2014-10-07

## 2014-09-29 NOTE — Patient Instructions (Addendum)
You have mild to moderate attack of copd called COPD exacerbation Please take doxycycline 100mg  twice daily after meals x 5 days; avoid sunlight Please take Take prednisone 40 mg daily x 2 days, then 20mg  daily x 2 days, then 10mg  daily x 2 days, then 5mg  daily x 2 days and stop   If worse go to ER SHould be ok for back cortisone shot by 5 days from now   FOllowup  -= regular pre-determined followup with Dr Gwenette Greet

## 2014-09-29 NOTE — Progress Notes (Signed)
Subjective:    Patient ID: Brad Turner, male    DOB: March 23, 1941, 74 y.o.   MRN: [id number]  HPI   OV 09/29/2014  Chief Complaint  Patient presents with  . Acute Visit    [facility] pt. Pt c/o prod cough with yellow and green, increase in SOB, chest tightness across anterior chest, pain in left lateral neck x 2 weeks. Pt stated while coughing this morning he became dizzy and fell to the floor, pt did not loose conciousness.     74 year old functional male patient of Dr. Danton Sewer for emphysemaan active smoking. He is a patient at pulmonary rehabilitation as well. He uses a 10 [place] history of worsening cough, congestion, sinus drainage, increased sputum volume and change in color of sputum from white to yellow green. Symptoms are moderate in severity insidious in onset and have not gotten better since onset. This morning he had significant cough that he had near cough syncope. This no fever or chills or chest pain or pedal edema.    has a past medical history of CVA (cerebral infarction); Hyperlipidemia; GERD (gastroesophageal reflux disease); CAD (coronary artery disease); Arthritis; Hypertension; Cataracts, bilateral; and Macular degeneration, wet.   reports that he has been smoking Cigarettes.  He has a 56 pack-year smoking history. He has never used smokeless tobacco.  Past Surgical History  Procedure Laterality Date  . Appendectomy    . Angioplasty      x4  . Tonsillectomy    . Rotator cuff repair      bilateral  . Perithyroid    . Cataracts Bilateral   . Cystoscopy  07/21/2013    Dr. Jeffie Pollock    Allergies  Allergen Reactions  . Ivp Dye [Iodinated Diagnostic Agents] Hives and Shortness Of Breath  . Nitrofurantoin Other (See Comments)    Fever that lasted several months    Immunization History  Administered Date(s) Administered  . Influenza Split 05/14/2012  . Influenza Whole 06/02/2010, 05/09/2011  . Influenza,inj,Quad PF,36+ Mos 06/02/2013, 05/24/2014  . Pneumococcal  Polysaccharide-23 06/02/2009    Family History  Problem Relation Age of Onset  . Stroke Mother   . Heart disease Father   . Rheum arthritis Mother   . Colon cancer      ? mat. uncle died age 72     Current outpatient prescriptions:  .  albuterol (PROVENTIL HFA;VENTOLIN HFA) 108 (90 BASE) MCG/ACT inhaler, Inhale 2 puffs into the lungs every 6 (six) hours as needed for wheezing or shortness of breath. , Disp: , Rfl:  .  amLODipine (NORVASC) 10 MG tablet, Take 5 mg by mouth every morning. , Disp: , Rfl:  .  aspirin 81 MG tablet, Take 81 mg by mouth every morning. , Disp: , Rfl:  .  DEXILANT 60 MG capsule, TAKE ONE CAPSULE BY MOUTH ONCE DAILY, Disp: 30 capsule, Rfl: 0 .  fenofibrate (TRICOR) 145 MG tablet, Take 145 mg by mouth every morning. , Disp: , Rfl:  .  folic acid (FOLVITE) 237 MCG tablet, Take 800 mcg by mouth daily., Disp: , Rfl:  .  mirabegron ER (MYRBETRIQ) 50 MG TB24 tablet, Take 50 mg by mouth daily., Disp: , Rfl:  .  NITROSTAT 0.4 MG SL tablet, Place 0.4 mg under the tongue every 5 (five) minutes as needed for chest pain. , Disp: , Rfl:  .  Omega-3 Fatty Acids (FISH OIL PO), Take 1 capsule by mouth daily., Disp: , Rfl:  .  ramipril (ALTACE) 10 MG tablet,  Take 10 mg by mouth every morning. , Disp: , Rfl:  .  tadalafil (CIALIS) 5 MG tablet, Take 5 mg by mouth daily. For BPH, Disp: , Rfl:  .  atorvastatin (LIPITOR) 80 MG tablet, Take 80 mg by mouth every morning. , Disp: , Rfl:  .  clopidogrel (PLAVIX) 75 MG tablet, Take 75 mg by mouth every morning. , Disp: , Rfl:  .  SYMBICORT 160-4.5 MCG/ACT inhaler, INHALE TWO PUFFS INTO LUNGS TWICE DAILY (Patient not taking: Reported on 09/29/2014), Disp: 1 Inhaler, Rfl: 6     Review of Systems  Constitutional: Negative for fever and unexpected weight change.  HENT: Negative for congestion, dental problem, ear pain, nosebleeds, postnasal drip, rhinorrhea, sinus pressure, sneezing, sore throat and trouble swallowing.   Eyes: Negative for  redness and itching.  Respiratory: Positive for cough, chest tightness and shortness of breath. Negative for wheezing.   Cardiovascular: Negative for palpitations and leg swelling.  Gastrointestinal: Negative for nausea and vomiting.  Genitourinary: Negative for dysuria.  Musculoskeletal: Negative for joint swelling.  Skin: Negative for rash.  Neurological: Positive for dizziness. Negative for headaches.  Hematological: Does not bruise/bleed easily.  Psychiatric/Behavioral: Negative for dysphoric mood. The patient is not nervous/anxious.        Objective:   Physical Exam  Constitutional: He is oriented to person, place, and time. He appears well-developed and well-nourished. No distress.  HENT:  Head: Normocephalic and atraumatic.  Right Ear: External ear normal.  Left Ear: External ear normal.  Mouth/Throat: Oropharynx is clear and moist. No oropharyngeal exudate.  Eyes: Conjunctivae and EOM are normal. Pupils are equal, round, and reactive to light. Right eye exhibits no discharge. Left eye exhibits no discharge. No scleral icterus.  Neck: Normal range of motion. Neck supple. No JVD present. No tracheal deviation present. No thyromegaly present.  Cardiovascular: Normal rate, regular rhythm and intact distal pulses.  Exam reveals no gallop and no friction rub.   No murmur heard. Pulmonary/Chest: Effort normal and breath sounds normal. No respiratory distress. He has no wheezes. He has no rales. He exhibits no tenderness.  Mild barrel chest Occasional scattered wheeze Decent air entry no respiratory distress.  Full sentences  Abdominal: Soft. Bowel sounds are normal. He exhibits no distension and no mass. There is no tenderness. There is no rebound and no guarding.  Musculoskeletal: Normal range of motion. He exhibits no edema or tenderness.  Lymphadenopathy:    He has no cervical adenopathy.  Neurological: He is alert and oriented to person, place, and time. He has normal reflexes.  No cranial nerve deficit. Coordination normal.  Skin: Skin is warm and dry. No rash noted. He is not diaphoretic. No erythema. No pallor.  Psychiatric: He has a normal mood and affect. His behavior is normal. Judgment and thought content normal.  Nursing note and vitals reviewed.   Filed Vitals:   09/29/14 1519  BP: 138/64  Pulse: 88  Temp: 98.2 F (36.8 C)  TempSrc: Oral  Height: 5\' 6"  (1.676 m)  Weight: 154 lb (69.854 kg)  SpO2: 93%         Assessment & Plan:     ICD-9-CM ICD-10-CM   1. COPD exacerbation 491.21 J44.1    You have mild to moderate attack of copd called COPD exacerbation Please take doxycycline 100mg  twice daily after meals x 5 days; avoid sunlight Please take Take prednisone 40 mg daily x 2 days, then 20mg  daily x 2 days, then 10mg  daily x 2 days, then  5mg  daily x 2 days and stop   If worse go to ER SHould be ok for back cortisone shot by 5 days from now   FOllowup  -= regular pre-determined followup with Dr Gwenette Greet    Dr. Brand Males, M.D., [facility].C.P Pulmonary and Critical Care Medicine Staff Physician [facility] Pager: [phone], If no answer or between  15:00h - 7:00h: call [phone]  09/29/2014 3:44 PM

## 2014-09-29 NOTE — Telephone Encounter (Signed)
Spoke with pt.  He reports a prod cough for past week Nyoka Cowden).  Cough till near syncope. Some sob and wheezing.  Add on to MR schedule today at 3:00.

## 2014-10-03 ENCOUNTER — Encounter (HOSPITAL_COMMUNITY)
Admission: RE | Admit: 2014-10-03 | Discharge: 2014-10-03 | Disposition: A | Discharge status: Home / Self Care | Payer: Self-pay | Source: Ambulatory Visit | Attending: Cardiology | Admitting: Cardiology

## 2014-10-03 DIAGNOSIS — I2510 Atherosclerotic heart disease of native coronary artery without angina pectoris: Secondary | ICD-10-CM | POA: Insufficient documentation

## 2014-10-03 DIAGNOSIS — Z5189 Encounter for other specified aftercare: Secondary | ICD-10-CM | POA: Insufficient documentation

## 2014-10-03 DIAGNOSIS — E785 Hyperlipidemia, unspecified: Secondary | ICD-10-CM | POA: Insufficient documentation

## 2014-10-03 NOTE — Progress Notes (Signed)
Patient presented to cardiac rehab today for exercise without complaints. Initial check in BP was 112/66. Patient exercised on all 3 stations without complaints or complications. After a 10 min sitting cool down/meditation, patients manual standing systolic pressure was 76. Patient denied dizziness, weakness, or blurred vision. After rehydrating with H2O, his pressure increased to 112/66 per electronic cuff. Patient continued to deny complaints, but at this time he stated he was attempting to schedule an appt with his cardiologist for today r/t 2 recent falls believed to be related to his BP (per patient). Patient stated he had a fall Wednesday night while attempting to go to the bathroom in the middle of the night. He stated the room began to spin once out of bed and he had to lower himself to the floor to prevent passing out. The second episode this past Friday night resulted in a fall with injury. He again attempted to get out of bed to go to the bathroom when he passed out. He sustained an injury to his nose and the side of his face. Orthostatics taken during this appointment. Lying 122/70, sitting 116/67, standing 94/60. Patient educated and instructed to change positions slowly and to never "jump up" out of bed or from a sitting position. He was also educated on proper hydration. Dr. Thurman Coyer office notified of patients stated episodes and orthostatics. Instructed patient to not to return to cardiac rehab for exercise until he has spoken with Dr. Thurman Coyer office. Patients discharged without present complaints with BP 127/71 and HR 95.

## 2014-10-04 ENCOUNTER — Encounter (HOSPITAL_COMMUNITY): Admission: RE | Admit: 2014-10-04 | Payer: Self-pay | Source: Ambulatory Visit

## 2014-10-06 ENCOUNTER — Encounter (HOSPITAL_COMMUNITY): Admission: RE | Admit: 2014-10-06 | Payer: Self-pay | Source: Ambulatory Visit

## 2014-10-06 ENCOUNTER — Telehealth: Payer: Self-pay | Admitting: Pulmonary Disease

## 2014-10-06 NOTE — Telephone Encounter (Signed)
From the note, it does not sound like he has a chest infection.  Suspect his episode related to cough syncope. The PRIMARY DRIVER OF HIS COUGH IS SMOKING!!  His cough will not go away until he quits. I don't mind calling in some hydromet 5 cc q6hr as needed to help settle his cough down, but this is only a one time thing.  #4 ounces, no fills.

## 2014-10-06 NOTE — Telephone Encounter (Signed)
Called pt and he is scheduled to come in to see [facility] tomorrow as he does not agree and thinks it is chest infection. Nothing further needed

## 2014-10-06 NOTE — Telephone Encounter (Signed)
Pt c/o increased cough and has passed out a few times recently.  Pt states that he was seen by his Cardiologist after having a bad fall in his bathroom - pt reports "face planting" when he passed out in his bathroom 10/01/14 Pt has been placed on an event monitor to monitor his heart.   Pt would like rec's from Dr Gwenette Greet for cough. Please advise. Thanks.

## 2014-10-07 ENCOUNTER — Encounter: Payer: Self-pay | Admitting: Pulmonary Disease

## 2014-10-07 ENCOUNTER — Ambulatory Visit (INDEPENDENT_AMBULATORY_CARE_PROVIDER_SITE_OTHER): Payer: Medicare Other | Admitting: Pulmonary Disease

## 2014-10-07 VITALS — BP 124/76 | HR 84 | Temp 97.0°F | Ht 66.0 in | Wt 163.4 lb

## 2014-10-07 DIAGNOSIS — J438 Other emphysema: Secondary | ICD-10-CM

## 2014-10-07 DIAGNOSIS — R55 Syncope and collapse: Secondary | ICD-10-CM

## 2014-10-07 DIAGNOSIS — R05 Cough: Secondary | ICD-10-CM | POA: Insufficient documentation

## 2014-10-07 DIAGNOSIS — R054 Cough syncope: Secondary | ICD-10-CM

## 2014-10-07 MED ORDER — VALSARTAN 80 MG PO TABS: 80.0000 mg | ORAL_TABLET | Freq: Every day | ORAL | Status: DC

## 2014-10-07 MED ORDER — HYDROCODONE-HOMATROPINE 5-1.5 MG/5ML PO SYRP: 5.0000 mL | ORAL_SOLUTION | Freq: Four times a day (QID) | ORAL | Status: DC | PRN

## 2014-10-07 NOTE — Progress Notes (Signed)
   Subjective:    Patient ID: Brad Turner, male    DOB: Oct 29, 1940, 74 y.o.   MRN: [id number]  HPI Patient comes in today for worsening cough and classic description of cough syncope. His cough is high-pitched and barking in nature, and only produces white mucus.  He does not feel congested, nor has he had increasing shortness of breath when not coughing. He has had episodes of syncope that are related to coughing episodes and paroxysms. Unfortunately, he continues to smoke, and is also on an ACE inhibitor.   Review of Systems  Constitutional: Negative for fever and unexpected weight change.  HENT: Positive for congestion and postnasal drip. Negative for dental problem, ear pain, nosebleeds, rhinorrhea, sinus pressure, sneezing, sore throat and trouble swallowing.   Eyes: Negative for redness and itching.  Respiratory: Positive for cough, chest tightness, shortness of breath and wheezing.   Cardiovascular: Negative for palpitations and leg swelling.  Gastrointestinal: Negative for nausea and vomiting.  Genitourinary: Negative for dysuria.  Musculoskeletal: Negative for joint swelling.  Skin: Negative for rash.  Neurological: Negative for headaches.  Hematological: Does not bruise/bleed easily.  Psychiatric/Behavioral: Negative for dysphoric mood. The patient is not nervous/anxious.        Objective:   Physical Exam Overweight male in no acute distress Nose without purulence or discharge noted Neck without lymphadenopathy or thyromegaly Chest completely clear to auscultation with no wheezes or crackles Cardiac exam with regular rate and rhythm Lower extremities with minimal edema, no cyanosis Alert and oriented, moves all 4 extremities.       Assessment & Plan:

## 2014-10-07 NOTE — Patient Instructions (Signed)
Stop ramipril.  Please call your prescribing physician to get a substitute. diovan 80mg  one each Monnin as a substitute for your ramipril until you can contact your physician. Complete voice rest if able.  No singing or yelling. No throat clearing. Just swallow or drink something. Use hard candy to bathe the back of your throat Can use hydromet cough syrup one teaspoon every 6 hrs if needed.  Would take regularly this weekend, then as needed starting next week. Stop smoking.  This is a significant contributor to your cough

## 2014-10-07 NOTE — Assessment & Plan Note (Signed)
The patient is describing classic cough syncope, and clearly his cough is upper airway in origin. It is a high-pitched barking cough and he has no purulence or congestion. His lungs are also totally clear to auscultation today. The patient has hoarseness and is describing a classic globus sensation. At this point, I think his cough is upper airway in origin and primarily related to cyclical coughing and his ACE inhibitor. Will discontinue this medication, and also work on cough suppression. I have also reviewed behavioral therapies to help with his globus sensation and urge to cough. Finally, I have stressed to him the importance of total smoking cessation.

## 2014-10-10 ENCOUNTER — Encounter (HOSPITAL_COMMUNITY): Payer: Self-pay

## 2014-10-11 ENCOUNTER — Telehealth: Payer: Self-pay | Admitting: Pulmonary Disease

## 2014-10-11 ENCOUNTER — Encounter (HOSPITAL_COMMUNITY)
Admission: RE | Admit: 2014-10-11 | Discharge: 2014-10-11 | Disposition: A | Discharge status: Home / Self Care | Payer: Self-pay | Source: Ambulatory Visit | Attending: Cardiology | Admitting: Cardiology

## 2014-10-11 NOTE — Telephone Encounter (Signed)
Spoke with pt, states he needs the last ov note with [facility] sent to Dr. Wynonia Lawman.  Phone number is below.  Fax number is [phone].  Last ov note has been faxed.  Nothing further needed.

## 2014-10-13 ENCOUNTER — Encounter (HOSPITAL_COMMUNITY)
Admission: RE | Admit: 2014-10-13 | Discharge: 2014-10-13 | Disposition: A | Discharge status: Home / Self Care | Payer: Self-pay | Source: Ambulatory Visit | Attending: Cardiology | Admitting: Cardiology

## 2014-10-16 ENCOUNTER — Other Ambulatory Visit: Payer: Self-pay | Admitting: Gastroenterology

## 2014-10-17 ENCOUNTER — Encounter (HOSPITAL_COMMUNITY): Payer: Self-pay

## 2014-10-18 ENCOUNTER — Encounter (HOSPITAL_COMMUNITY)
Admission: RE | Admit: 2014-10-18 | Discharge: 2014-10-18 | Disposition: A | Discharge status: Home / Self Care | Payer: Self-pay | Source: Ambulatory Visit | Attending: Cardiology | Admitting: Cardiology

## 2014-10-20 ENCOUNTER — Encounter (HOSPITAL_COMMUNITY): Payer: Self-pay

## 2014-10-24 ENCOUNTER — Encounter (HOSPITAL_COMMUNITY)
Admission: RE | Admit: 2014-10-24 | Discharge: 2014-10-24 | Disposition: A | Discharge status: Home / Self Care | Payer: Self-pay | Source: Ambulatory Visit | Attending: Cardiology | Admitting: Cardiology

## 2014-10-25 ENCOUNTER — Encounter (HOSPITAL_COMMUNITY)
Admission: RE | Admit: 2014-10-25 | Discharge: 2014-10-25 | Disposition: A | Discharge status: Home / Self Care | Payer: Self-pay | Source: Ambulatory Visit | Attending: Cardiology | Admitting: Cardiology

## 2014-10-27 ENCOUNTER — Encounter (HOSPITAL_COMMUNITY): Payer: Self-pay

## 2014-10-29 ENCOUNTER — Encounter (HOSPITAL_COMMUNITY): Payer: Self-pay

## 2014-10-29 ENCOUNTER — Emergency Department (HOSPITAL_COMMUNITY): Payer: Medicare Other

## 2014-10-29 ENCOUNTER — Observation Stay (HOSPITAL_COMMUNITY)
Admission: EM | Admit: 2014-10-29 | Discharge: 2014-10-30 | Disposition: A | Discharge status: Home / Self Care | Payer: Medicare Other | Attending: Internal Medicine | Admitting: Internal Medicine

## 2014-10-29 ENCOUNTER — Observation Stay (HOSPITAL_COMMUNITY): Payer: Medicare Other

## 2014-10-29 DIAGNOSIS — R55 Syncope and collapse: Secondary | ICD-10-CM | POA: Diagnosis present

## 2014-10-29 DIAGNOSIS — Z7982 Long term (current) use of aspirin: Secondary | ICD-10-CM | POA: Insufficient documentation

## 2014-10-29 DIAGNOSIS — E785 Hyperlipidemia, unspecified: Secondary | ICD-10-CM | POA: Diagnosis not present

## 2014-10-29 DIAGNOSIS — Y92000 Kitchen of unspecified non-institutional (private) residence as  the place of occurrence of the external cause: Secondary | ICD-10-CM | POA: Diagnosis not present

## 2014-10-29 DIAGNOSIS — Z7902 Long term (current) use of antithrombotics/antiplatelets: Secondary | ICD-10-CM | POA: Diagnosis not present

## 2014-10-29 DIAGNOSIS — S0003XA Contusion of scalp, initial encounter: Secondary | ICD-10-CM | POA: Diagnosis not present

## 2014-10-29 DIAGNOSIS — Z7951 Long term (current) use of inhaled steroids: Secondary | ICD-10-CM | POA: Insufficient documentation

## 2014-10-29 DIAGNOSIS — I10 Essential (primary) hypertension: Secondary | ICD-10-CM | POA: Diagnosis not present

## 2014-10-29 DIAGNOSIS — I2510 Atherosclerotic heart disease of native coronary artery without angina pectoris: Secondary | ICD-10-CM | POA: Diagnosis not present

## 2014-10-29 DIAGNOSIS — Z23 Encounter for immunization: Secondary | ICD-10-CM | POA: Diagnosis not present

## 2014-10-29 DIAGNOSIS — Z8673 Personal history of transient ischemic attack (TIA), and cerebral infarction without residual deficits: Secondary | ICD-10-CM | POA: Diagnosis not present

## 2014-10-29 DIAGNOSIS — K219 Gastro-esophageal reflux disease without esophagitis: Secondary | ICD-10-CM | POA: Diagnosis not present

## 2014-10-29 DIAGNOSIS — Z79899 Other long term (current) drug therapy: Secondary | ICD-10-CM | POA: Diagnosis not present

## 2014-10-29 DIAGNOSIS — I25119 Atherosclerotic heart disease of native coronary artery with unspecified angina pectoris: Secondary | ICD-10-CM | POA: Diagnosis present

## 2014-10-29 DIAGNOSIS — W1830XA Fall on same level, unspecified, initial encounter: Secondary | ICD-10-CM | POA: Diagnosis not present

## 2014-10-29 DIAGNOSIS — F17210 Nicotine dependence, cigarettes, uncomplicated: Secondary | ICD-10-CM | POA: Diagnosis not present

## 2014-10-29 DIAGNOSIS — J439 Emphysema, unspecified: Secondary | ICD-10-CM | POA: Insufficient documentation

## 2014-10-29 DIAGNOSIS — R0602 Shortness of breath: Secondary | ICD-10-CM

## 2014-10-29 DIAGNOSIS — I119 Hypertensive heart disease without heart failure: Secondary | ICD-10-CM | POA: Diagnosis present

## 2014-10-29 LAB — I-STAT CHEM 8, ED
BUN: 15 mg/dL (ref 6–23)
CALCIUM ION: 1.13 mmol/L (ref 1.13–1.30)
CHLORIDE: 101 mmol/L (ref 96–112)
Creatinine, Ser: 1.4 mg/dL — ABNORMAL HIGH (ref 0.50–1.35)
Glucose, Bld: 114 mg/dL — ABNORMAL HIGH (ref 70–99)
HEMATOCRIT: 37 % — AB (ref 39.0–52.0)
Hemoglobin: 12.6 g/dL — ABNORMAL LOW (ref 13.0–17.0)
Potassium: 3.5 mmol/L (ref 3.5–5.1)
Sodium: 139 mmol/L (ref 135–145)
TCO2: 21 mmol/L (ref 0–100)

## 2014-10-29 LAB — CBC WITH DIFFERENTIAL/PLATELET
Basophils Absolute: 0.1 10*3/uL (ref 0.0–0.1)
Basophils Relative: 1 % (ref 0–1)
EOS ABS: 0.2 10*3/uL (ref 0.0–0.7)
Eosinophils Relative: 3 % (ref 0–5)
HCT: 36 % — ABNORMAL LOW (ref 39.0–52.0)
Hemoglobin: 11.6 g/dL — ABNORMAL LOW (ref 13.0–17.0)
Lymphocytes Relative: 21 % (ref 12–46)
Lymphs Abs: 1.8 10*3/uL (ref 0.7–4.0)
MCH: 30.4 pg (ref 26.0–34.0)
MCHC: 32.2 g/dL (ref 30.0–36.0)
MCV: 94.5 fL (ref 78.0–100.0)
MONO ABS: 0.9 10*3/uL (ref 0.1–1.0)
Monocytes Relative: 11 % (ref 3–12)
NEUTROS PCT: 64 % (ref 43–77)
Neutro Abs: 5.6 10*3/uL (ref 1.7–7.7)
Platelets: 479 10*3/uL — ABNORMAL HIGH (ref 150–400)
RBC: 3.81 MIL/uL — ABNORMAL LOW (ref 4.22–5.81)
RDW: 14.1 % (ref 11.5–15.5)
WBC: 8.6 10*3/uL (ref 4.0–10.5)

## 2014-10-29 LAB — D-DIMER, QUANTITATIVE (NOT AT ARMC): D DIMER QUANT: 0.74 ug{FEU}/mL — AB (ref 0.00–0.48)

## 2014-10-29 LAB — I-STAT TROPONIN, ED: TROPONIN I, POC: 0.01 ng/mL (ref 0.00–0.08)

## 2014-10-29 LAB — ETHANOL: Alcohol, Ethyl (B): 154 mg/dL — ABNORMAL HIGH (ref 0–9)

## 2014-10-29 MED ORDER — ACETAMINOPHEN 325 MG PO TABS
650.0000 mg | ORAL_TABLET | Freq: Four times a day (QID) | ORAL | Status: DC | PRN
  Administered 2014-10-29 – 2014-10-30 (×3): 650 mg via ORAL
  Filled 2014-10-29 (×3): qty 2

## 2014-10-29 MED ORDER — TECHNETIUM TC 99M DIETHYLENETRIAME-PENTAACETIC ACID: 43.4000 | Freq: Once | INTRAVENOUS | Status: AC | PRN

## 2014-10-29 MED ORDER — TRAMADOL HCL 50 MG PO TABS
50.0000 mg | ORAL_TABLET | Freq: Four times a day (QID) | ORAL | Status: DC | PRN
  Administered 2014-10-29 – 2014-10-30 (×2): 50 mg via ORAL
  Filled 2014-10-29 (×2): qty 1

## 2014-10-29 MED ORDER — ENOXAPARIN SODIUM 40 MG/0.4ML ~~LOC~~ SOLN
40.0000 mg | SUBCUTANEOUS | Status: DC
  Administered 2014-10-29: 40 mg via SUBCUTANEOUS
  Filled 2014-10-29: qty 0.4

## 2014-10-29 MED ORDER — ACETAMINOPHEN 650 MG RE SUPP: 650.0000 mg | Freq: Four times a day (QID) | RECTAL | Status: DC | PRN

## 2014-10-29 MED ORDER — PANTOPRAZOLE SODIUM 40 MG PO TBEC
40.0000 mg | DELAYED_RELEASE_TABLET | Freq: Every day | ORAL | Status: DC
  Administered 2014-10-29 – 2014-10-30 (×2): 40 mg via ORAL
  Filled 2014-10-29 (×2): qty 1

## 2014-10-29 MED ORDER — AMLODIPINE BESYLATE 5 MG PO TABS
5.0000 mg | ORAL_TABLET | Freq: Every morning | ORAL | Status: DC
  Administered 2014-10-29 – 2014-10-30 (×2): 5 mg via ORAL
  Filled 2014-10-29 (×2): qty 1

## 2014-10-29 MED ORDER — TETANUS-DIPHTH-ACELL PERTUSSIS 5-2.5-18.5 LF-MCG/0.5 IM SUSP
0.5000 mL | Freq: Once | INTRAMUSCULAR | Status: AC
  Administered 2014-10-29: 0.5 mL via INTRAMUSCULAR
  Filled 2014-10-29: qty 0.5

## 2014-10-29 MED ORDER — SODIUM CHLORIDE 0.9 % IV SOLN
INTRAVENOUS | Status: DC
  Administered 2014-10-29: 12:00:00 via INTRAVENOUS

## 2014-10-29 MED ORDER — SODIUM CHLORIDE 0.9 % IV BOLUS (SEPSIS)
1000.0000 mL | Freq: Once | INTRAVENOUS | Status: AC
  Administered 2014-10-29: 1000 mL via INTRAVENOUS

## 2014-10-29 MED ORDER — HYDROCODONE-HOMATROPINE 5-1.5 MG/5ML PO SYRP
5.0000 mL | ORAL_SOLUTION | Freq: Four times a day (QID) | ORAL | Status: DC | PRN
  Administered 2014-10-29 – 2014-10-30 (×4): 5 mL via ORAL
  Filled 2014-10-29 (×4): qty 5

## 2014-10-29 MED ORDER — MIRABEGRON ER 50 MG PO TB24
50.0000 mg | ORAL_TABLET | Freq: Every day | ORAL | Status: DC
  Administered 2014-10-29 – 2014-10-30 (×2): 50 mg via ORAL
  Filled 2014-10-29 (×2): qty 1

## 2014-10-29 MED ORDER — ALBUTEROL SULFATE HFA 108 (90 BASE) MCG/ACT IN AERS: 2.0000 | INHALATION_SPRAY | Freq: Four times a day (QID) | RESPIRATORY_TRACT | Status: DC | PRN

## 2014-10-29 MED ORDER — BENZONATATE 100 MG PO CAPS
200.0000 mg | ORAL_CAPSULE | Freq: Once | ORAL | Status: AC
  Administered 2014-10-29: 200 mg via ORAL
  Filled 2014-10-29: qty 2

## 2014-10-29 MED ORDER — BUDESONIDE-FORMOTEROL FUMARATE 160-4.5 MCG/ACT IN AERO
2.0000 | INHALATION_SPRAY | Freq: Two times a day (BID) | RESPIRATORY_TRACT | Status: DC
  Administered 2014-10-29 – 2014-10-30 (×2): 2 via RESPIRATORY_TRACT
  Filled 2014-10-29: qty 6

## 2014-10-29 MED ORDER — ATORVASTATIN CALCIUM 80 MG PO TABS
80.0000 mg | ORAL_TABLET | Freq: Every day | ORAL | Status: DC
  Administered 2014-10-29: 80 mg via ORAL
  Filled 2014-10-29 (×2): qty 1

## 2014-10-29 MED ORDER — CLOPIDOGREL BISULFATE 75 MG PO TABS
75.0000 mg | ORAL_TABLET | Freq: Every morning | ORAL | Status: DC
  Administered 2014-10-29 – 2014-10-30 (×2): 75 mg via ORAL
  Filled 2014-10-29 (×2): qty 1

## 2014-10-29 MED ORDER — TECHNETIUM TO 99M ALBUMIN AGGREGATED
5.5000 | Freq: Once | INTRAVENOUS | Status: AC | PRN
  Administered 2014-10-29: 6 via INTRAVENOUS

## 2014-10-29 MED ORDER — NITROGLYCERIN 0.4 MG SL SUBL: 0.4000 mg | SUBLINGUAL_TABLET | SUBLINGUAL | Status: DC | PRN

## 2014-10-29 MED ORDER — ASPIRIN EC 81 MG PO TBEC
81.0000 mg | DELAYED_RELEASE_TABLET | Freq: Every morning | ORAL | Status: DC
  Administered 2014-10-29 – 2014-10-30 (×2): 81 mg via ORAL
  Filled 2014-10-29 (×2): qty 1

## 2014-10-29 MED ORDER — FENOFIBRATE 160 MG PO TABS
160.0000 mg | ORAL_TABLET | Freq: Every day | ORAL | Status: DC
  Administered 2014-10-29 – 2014-10-30 (×2): 160 mg via ORAL
  Filled 2014-10-29 (×2): qty 1

## 2014-10-29 MED ORDER — ONDANSETRON HCL 4 MG/2ML IJ SOLN: 4.0000 mg | Freq: Four times a day (QID) | INTRAMUSCULAR | Status: DC | PRN

## 2014-10-29 MED ORDER — TADALAFIL 5 MG PO TABS: 5.0000 mg | ORAL_TABLET | Freq: Every day | ORAL | Status: DC

## 2014-10-29 MED ORDER — ALBUTEROL SULFATE (2.5 MG/3ML) 0.083% IN NEBU: 2.5000 mg | INHALATION_SOLUTION | Freq: Four times a day (QID) | RESPIRATORY_TRACT | Status: DC | PRN

## 2014-10-29 MED ORDER — ONDANSETRON HCL 4 MG PO TABS: 4.0000 mg | ORAL_TABLET | Freq: Four times a day (QID) | ORAL | Status: DC | PRN

## 2014-10-29 NOTE — ED Notes (Signed)
Spoke with March Rummage, CN 4th floor @ [phone].

## 2014-10-29 NOTE — ED Notes (Signed)
Below orders not completed by EW. 

## 2014-10-29 NOTE — H&P (Signed)
Brad Turner is an 74 y.o. male.   Chief Complaint: syncope HPI: Brad Turner is a pleasant 74 yo male with pmhx as below.   He reports a 2.5 month history of cough.  He thinks it may have started As a cold.  He has constant Brad Turner and night coughing.  He has passed out 4 times while coughing now. He has had eval by cardiology and pulmonology.  He is on narcotic cough syrup with little help.  He was in the kitchen last Night when he passed out. He struck the left posterior part of head. He has a hematoma.   He was coughing at the time. He came to in a minute.  He lives alone.   A friend brought him to the hospital.  In the ER eval is notable for elevated d-dimer and a v/q scan is Ordered.   He denies fever.  No chest.  He does have some productive mucous.  He has some head drainage. He denies blood in stool or urine.  He will admitted to continue to rule out P.E. And to further consider cause of cough and  Tussive syncope.  Past Medical History  Diagnosis Date  . CVA (cerebral infarction)   . Hyperlipidemia   . GERD (gastroesophageal reflux disease)   . CAD (coronary artery disease)   . Arthritis   . Hypertension   . Cataracts, bilateral   . Macular degeneration, wet     right eye  copd  Past Surgical History  Procedure Laterality Date  . Appendectomy    . Angioplasty      x4  No stents  . Tonsillectomy    . Rotator cuff repair      bilateral  . Perithyroid    . Cataracts Bilateral   . Cystoscopy  07/21/2013    Dr. Jeffie Pollock    Family History  Problem Relation Age of Onset  . Stroke Mother   . Heart disease Father   . Rheum arthritis Mother   . Colon cancer      ? mat. uncle died age 73   Social History:  He is single.  Father of one.  Son Brad Turner.  One grandchild.  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 reports that he has been smoking Cigarettes.  He has a 56 pack-year smoking history. He has never used smokeless tobacco. He reports that he drinks about 7.0 oz of alcohol per  week. He reports that he does not use illicit drugs.  He states he had two drinks last night prior to this event.  Allergies:  Allergies  Allergen Reactions  . Ivp Dye [Iodinated Diagnostic Agents] Hives and Shortness Of Breath  . Nitrofurantoin Other (See Comments)    Fever that lasted several months     Home meds:   Tessalon perles. Just started. Took one diovan 80 mg daily lipitor 80  Mg daily Amlodipine 5 mg daily dexilant 60 mg daily tricor 177 mg daily Folic acid 939 mcg daily. Asa  81 mg daily Lutein 20 mg symbicort 160-4.5 2 puffs bid cialis 5 mg daily myrbetriq 50 mg daily plavix 75 mg daily   Results for orders placed or performed during the hospital encounter of 10/29/14 (from the past 48 hour(s))  CBC with Differential/Platelet     Status: Abnormal   Collection Time: 10/29/14  3:29 AM  Result Value Ref Range   WBC 8.6 4.0 - 10.5 K/uL   RBC 3.81 (L) 4.22 - 5.81 MIL/uL  Hemoglobin 11.6 (L) 13.0 - 17.0 g/dL   HCT 36.0 (L) 39.0 - 52.0 %   MCV 94.5 78.0 - 100.0 fL   MCH 30.4 26.0 - 34.0 pg   MCHC 32.2 30.0 - 36.0 g/dL   RDW 14.1 11.5 - 15.5 %   Platelets 479 (H) 150 - 400 K/uL   Neutrophils Relative % 64 43 - 77 %   Neutro Abs 5.6 1.7 - 7.7 K/uL   Lymphocytes Relative 21 12 - 46 %   Lymphs Abs 1.8 0.7 - 4.0 K/uL   Monocytes Relative 11 3 - 12 %   Monocytes Absolute 0.9 0.1 - 1.0 K/uL   Eosinophils Relative 3 0 - 5 %   Eosinophils Absolute 0.2 0.0 - 0.7 K/uL   Basophils Relative 1 0 - 1 %   Basophils Absolute 0.1 0.0 - 0.1 K/uL  Ethanol     Status: Abnormal   Collection Time: 10/29/14  3:31 AM  Result Value Ref Range   Alcohol, Ethyl (B) 154 (H) 0 - 9 mg/dL    Comment:        LOWEST DETECTABLE LIMIT FOR SERUM ALCOHOL IS 11 mg/dL FOR MEDICAL PURPOSES ONLY   I-stat chem 8, ed     Status: Abnormal   Collection Time: 10/29/14  3:42 AM  Result Value Ref Range   Sodium 139 135 - 145 mmol/L   Potassium 3.5 3.5 - 5.1 mmol/L   Chloride 101 96 - 112  mmol/L   BUN 15 6 - 23 mg/dL   Creatinine, Ser 1.40 (H) 0.50 - 1.35 mg/dL   Glucose, Bld 114 (H) 70 - 99 mg/dL   Calcium, Ion 1.13 1.13 - 1.30 mmol/L   TCO2 21 0 - 100 mmol/L   Hemoglobin 12.6 (L) 13.0 - 17.0 g/dL   HCT 37.0 (L) 39.0 - 52.0 %  I-stat troponin, ED     Status: None   Collection Time: 10/29/14  4:25 AM  Result Value Ref Range   Troponin i, poc 0.01 0.00 - 0.08 ng/mL   Comment 3            Comment: Due to the release kinetics of cTnI, a negative result within the first hours of the onset of symptoms does not rule out myocardial infarction with certainty. If myocardial infarction is still suspected, repeat the test at appropriate intervals.   D-dimer, quantitative     Status: Abnormal   Collection Time: 10/29/14  5:09 AM  Result Value Ref Range   D-Dimer, Quant 0.74 (H) 0.00 - 0.48 ug/mL-FEU    Comment:        AT THE INHOUSE ESTABLISHED CUTOFF VALUE OF 0.48 ug/mL FEU, THIS ASSAY HAS BEEN DOCUMENTED IN THE LITERATURE TO HAVE A SENSITIVITY AND NEGATIVE PREDICTIVE VALUE OF AT LEAST 98 TO 99%.  THE TEST RESULT SHOULD BE CORRELATED WITH AN ASSESSMENT OF THE CLINICAL PROBABILITY OF DVT / VTE.    Dg Chest 2 View  10/29/2014   CLINICAL DATA:  Fall, head injury.  Loss of consciousness.  EXAM: CHEST  2 VIEW  COMPARISON:  04/28/2014  FINDINGS: Heart at the upper limits of normal in size, mediastinal contours are normal. The lungs remain hyperinflated with emphysematous change. No consolidation, pleural effusion, or pneumothorax. No acute osseous abnormalities. Degenerative change again seen in the thoracic spine.  IMPRESSION: No acute pulmonary process.   Electronically Signed   By: Jeb Levering M.D.   On: 10/29/2014 03:05   Ct Head Wo Contrast  10/29/2014  CLINICAL DATA:  Syncope, fall 1 hour prior. Loss of consciousness, now with head pain and generalized neck pain.  EXAM: CT HEAD WITHOUT CONTRAST  CT CERVICAL SPINE WITHOUT CONTRAST  TECHNIQUE: Multidetector CT  imaging of the head and cervical spine was performed following the standard protocol without intravenous contrast. Multiplanar CT image reconstructions of the cervical spine were also generated.  COMPARISON:  Cervical spine MRI 05/15/2014  FINDINGS: CT HEAD FINDINGS  Left parietal scalp hematoma. No associated fracture. No intracranial hemorrhage, mass effect, or midline shift. Mild white matter change, likely related to chronic small vessel ischemia. No hydrocephalus. The basilar cisterns are patent. No evidence of territorial infarct. No intracranial fluid collection. Calvarium is intact. Scattered mucosal thickening in the ethmoid air cells, paranasal sinuses are otherwise well-aerated. The mastoid air cells are well aerated.  CT CERVICAL SPINE FINDINGS  No acute fracture. Dens is intact. Reversal of normal lordosis, similar to prior exam. Mild anterolisthesis of C3 on C4, has progressed from prior, however appears degenerative. There is diffuse disc space narrowing and endplate spurring from X1-G6 through C6-C7. Multilevel facet arthropathy most prominent on the left that C2-C3 and C3-C4. No jumped or perched facets. Emphysema noted at the lung apices. There is a punctate calcification in the right thyroid lobe.  IMPRESSION: 1. No acute intracranial abnormality. Left parietal scalp hematoma without fracture. 2. No acute fracture or subluxation of the cervical spine. Multilevel degenerative change.   Electronically Signed   By: Jeb Levering M.D.   On: 10/29/2014 03:02   Ct Cervical Spine Wo Contrast  10/29/2014   CLINICAL DATA:  Syncope, fall 1 hour prior. Loss of consciousness, now with head pain and generalized neck pain.  EXAM: CT HEAD WITHOUT CONTRAST  CT CERVICAL SPINE WITHOUT CONTRAST  TECHNIQUE: Multidetector CT imaging of the head and cervical spine was performed following the standard protocol without intravenous contrast. Multiplanar CT image reconstructions of the cervical spine were also  generated.  COMPARISON:  Cervical spine MRI 05/15/2014  FINDINGS: CT HEAD FINDINGS  Left parietal scalp hematoma. No associated fracture. No intracranial hemorrhage, mass effect, or midline shift. Mild white matter change, likely related to chronic small vessel ischemia. No hydrocephalus. The basilar cisterns are patent. No evidence of territorial infarct. No intracranial fluid collection. Calvarium is intact. Scattered mucosal thickening in the ethmoid air cells, paranasal sinuses are otherwise well-aerated. The mastoid air cells are well aerated.  CT CERVICAL SPINE FINDINGS  No acute fracture. Dens is intact. Reversal of normal lordosis, similar to prior exam. Mild anterolisthesis of C3 on C4, has progressed from prior, however appears degenerative. There is diffuse disc space narrowing and endplate spurring from Y6-R4 through C6-C7. Multilevel facet arthropathy most prominent on the left that C2-C3 and C3-C4. No jumped or perched facets. Emphysema noted at the lung apices. There is a punctate calcification in the right thyroid lobe.  IMPRESSION: 1. No acute intracranial abnormality. Left parietal scalp hematoma without fracture. 2. No acute fracture or subluxation of the cervical spine. Multilevel degenerative change.   Electronically Signed   By: Jeb Levering M.D.   On: 10/29/2014 03:02    ROS:as per hpi  Blood pressure 135/78, pulse 86, temperature 97.8 F (36.6 C), temperature source Oral, resp. rate 13, SpO2 95 %.  age approp. male.  supine on stretcher. some dried blood on left hand and scalp.  He does have a cough spell  Once during my time in the room with him. No jvd.  Lungs are cta  bilat. No w/r/r. rrr no m/r/g abd soft, nt, nd, no mass or hsm. Moe ext. X 4.   No c/c/e. Grossly neuro intact.  Assessment/Plan 74 yo male with 2 to 3 months of severe cough even leading to syncope times 4.  He had a plain TD vaccine in 2011 at our office but states he probably has never had a tdap shot.   We will give one.  It is possible that this represents pertussis and at some point ENT eval may be useful.   For now we will admit,  We will continue home meds.  We will V/Q to rule out P.E. But my suspicion for that is rather low now.   Continue tele for 24 hrs or so. Continue narcotic cough syrup tid.   I will also stop the diovan as sometimes this can cause cough too (rarely) and his ramipril was already stopped 2.5 weeks ago.  Monitor BP. Full code status.  Jerlyn Ly, MD 10/29/2014, 7:32 AM

## 2014-10-29 NOTE — ED Notes (Signed)
EDP at bedside  

## 2014-10-29 NOTE — ED Notes (Addendum)
He has just returned from V Q scan, and I transport him to [facility] [place] at this time.  He remains awake, alert and in no distress.

## 2014-10-29 NOTE — ED Provider Notes (Signed)
CSN: [id number]     Arrival date & time 10/29/14  0156 History   First MD Initiated Contact with Patient 10/29/14 0207     Chief Complaint  Patient presents with  . Loss of Consciousness  . Head Injury  . Fall     (Consider location/radiation/quality/duration/timing/severity/associated sxs/prior Treatment) Patient is a 74 y.o. male presenting with syncope, head injury, and fall. The history is provided by the patient.  Loss of Consciousness Episode history:  Single Timing:  Constant Progression:  Resolved Chronicity:  Recurrent Context: not sight of blood   Relieved by:  Nothing Associated symptoms: no anxiety, no fever, no palpitations, no seizures, no shortness of breath, no vomiting and no weakness   Head Injury Associated symptoms: no seizures and no vomiting   Fall Pertinent negatives include no shortness of breath.    Past Medical History  Diagnosis Date  . CVA (cerebral infarction)   . Hyperlipidemia   . GERD (gastroesophageal reflux disease)   . CAD (coronary artery disease)   . Arthritis   . Hypertension   . Cataracts, bilateral   . Macular degeneration, wet     right eye   Past Surgical History  Procedure Laterality Date  . Appendectomy    . Angioplasty      x4  . Tonsillectomy    . Rotator cuff repair      bilateral  . Perithyroid    . Cataracts Bilateral   . Cystoscopy  07/21/2013    Dr. Jeffie Pollock   Family History  Problem Relation Age of Onset  . Stroke Mother   . Heart disease Father   . Rheum arthritis Mother   . Colon cancer      ? mat. uncle died age 107   History  Substance Use Topics  . Smoking status: Current Every Rosario Smoker -- 1.00 packs/Tout for 56 years    Types: Cigarettes  . Smokeless tobacco: Never Used     Comment: Tobaccco info given 10/15/13  . Alcohol Use: 7.0 oz/week    14 drink(s) per week     Comment: every [place], vodka    Review of Systems  Constitutional: Negative for fever.  Respiratory: Positive for cough. Negative  for shortness of breath and wheezing.   Cardiovascular: Positive for syncope. Negative for palpitations and leg swelling.  Gastrointestinal: Negative for vomiting.  Neurological: Negative for seizures and weakness.  All other systems reviewed and are negative.     Allergies  Ivp dye and Nitrofurantoin  Home Medications   Prior to Admission medications   Medication Sig Start Date End Date Taking? Authorizing Provider  albuterol (PROVENTIL HFA;VENTOLIN HFA) 108 (90 BASE) MCG/ACT inhaler Inhale 2 puffs into the lungs every 6 (six) hours as needed for wheezing or shortness of breath.    Yes Historical Provider, MD  amLODipine (NORVASC) 10 MG tablet Take 5 mg by mouth every morning.    Yes Historical Provider, MD  aspirin 81 MG tablet Take 81 mg by mouth every morning.    Yes Historical Provider, MD  atorvastatin (LIPITOR) 80 MG tablet Take 80 mg by mouth every morning.    Yes Historical Provider, MD  clopidogrel (PLAVIX) 75 MG tablet Take 75 mg by mouth every morning.    Yes Historical Provider, MD  DEXILANT 60 MG capsule TAKE ONE CAPSULE BY MOUTH ONCE DAILY. 10/18/14  Yes Inda Castle, MD  fenofibrate (TRICOR) 145 MG tablet Take 145 mg by mouth every morning.    Yes Historical Provider,  MD  folic acid (FOLVITE) 412 MCG tablet Take 800 mcg by mouth daily.   Yes Historical Provider, MD  mirabegron ER (MYRBETRIQ) 50 MG TB24 tablet Take 50 mg by mouth daily.   Yes Historical Provider, MD  Omega-3 Fatty Acids (FISH OIL PO) Take 1 capsule by mouth daily.   Yes Historical Provider, MD  ramipril (ALTACE) 10 MG tablet Take 10 mg by mouth every morning.    Yes Historical Provider, MD  SYMBICORT 160-4.5 MCG/ACT inhaler INHALE TWO PUFFS INTO LUNGS TWICE DAILY 04/19/14  Yes Kathee Delton, MD  valsartan (DIOVAN) 80 MG tablet Take 1 tablet (80 mg total) by mouth daily. 10/07/14  Yes Kathee Delton, MD  HYDROcodone-homatropine [facility]) 5-1.5 MG/5ML syrup Take 5 mLs by mouth every 6 (six) hours as needed  for cough. 10/07/14   Kathee Delton, MD  NITROSTAT 0.4 MG SL tablet Place 0.4 mg under the tongue every 5 (five) minutes as needed for chest pain.  05/29/13   Historical Provider, MD  tadalafil (CIALIS) 5 MG tablet Take 5 mg by mouth daily. For BPH    Historical Provider, MD   BP 142/70 mmHg  Pulse 82  Temp(Src) 97.8 F (36.6 C) (Oral)  Resp 18  SpO2 99% Physical Exam  Constitutional: He is oriented to person, place, and time. He appears well-developed and well-nourished. No distress.  HENT:  Head: Normocephalic and atraumatic.  Mouth/Throat: Oropharynx is clear and moist.  Eyes: Conjunctivae are normal. Pupils are equal, round, and reactive to light.  Neck: Normal range of motion. Neck supple.  Cardiovascular: Normal rate, regular rhythm and intact distal pulses.   Pulmonary/Chest: Effort normal and breath sounds normal. No respiratory distress. He has no wheezes. He has no rales.  Abdominal: Soft. Bowel sounds are normal. There is no tenderness. There is no rebound and no guarding.  Musculoskeletal: Normal range of motion.  Neurological: He is alert and oriented to person, place, and time.  Skin: Skin is warm and dry.  Psychiatric: He has a normal mood and affect.    ED Course  Procedures (including critical care time) Labs Review Labs Reviewed  CBC WITH DIFFERENTIAL/PLATELET - Abnormal; Notable for the following:    RBC 3.81 (*)    Hemoglobin 11.6 (*)    HCT 36.0 (*)    Platelets 479 (*)    All other components within normal limits  ETHANOL - Abnormal; Notable for the following:    Alcohol, Ethyl (B) 154 (*)    All other components within normal limits  D-DIMER, QUANTITATIVE - Abnormal; Notable for the following:    D-Dimer, Quant 0.74 (*)    All other components within normal limits  I-STAT CHEM 8, ED - Abnormal; Notable for the following:    Creatinine, Ser 1.40 (*)    Glucose, Bld 114 (*)    Hemoglobin 12.6 (*)    HCT 37.0 (*)    All other components within normal  limits  Randolm Idol, ED    Imaging Review Dg Chest 2 View  10/29/2014   CLINICAL DATA:  Fall, head injury.  Loss of consciousness.  EXAM: CHEST  2 VIEW  COMPARISON:  04/28/2014  FINDINGS: Heart at the upper limits of normal in size, mediastinal contours are normal. The lungs remain hyperinflated with emphysematous change. No consolidation, pleural effusion, or pneumothorax. No acute osseous abnormalities. Degenerative change again seen in the thoracic spine.  IMPRESSION: No acute pulmonary process.   Electronically Signed   By: Jeb Levering  M.D.   On: 10/29/2014 03:05   Ct Head Wo Contrast  10/29/2014   CLINICAL DATA:  Syncope, fall 1 hour prior. Loss of consciousness, now with head pain and generalized neck pain.  EXAM: CT HEAD WITHOUT CONTRAST  CT CERVICAL SPINE WITHOUT CONTRAST  TECHNIQUE: Multidetector CT imaging of the head and cervical spine was performed following the standard protocol without intravenous contrast. Multiplanar CT image reconstructions of the cervical spine were also generated.  COMPARISON:  Cervical spine MRI 05/15/2014  FINDINGS: CT HEAD FINDINGS  Left parietal scalp hematoma. No associated fracture. No intracranial hemorrhage, mass effect, or midline shift. Mild white matter change, likely related to chronic small vessel ischemia. No hydrocephalus. The basilar cisterns are patent. No evidence of territorial infarct. No intracranial fluid collection. Calvarium is intact. Scattered mucosal thickening in the ethmoid air cells, paranasal sinuses are otherwise well-aerated. The mastoid air cells are well aerated.  CT CERVICAL SPINE FINDINGS  No acute fracture. Dens is intact. Reversal of normal lordosis, similar to prior exam. Mild anterolisthesis of C3 on C4, has progressed from prior, however appears degenerative. There is diffuse disc space narrowing and endplate spurring from Q4-O9 through C6-C7. Multilevel facet arthropathy most prominent on the left that C2-C3 and C3-C4.  No jumped or perched facets. Emphysema noted at the lung apices. There is a punctate calcification in the right thyroid lobe.  IMPRESSION: 1. No acute intracranial abnormality. Left parietal scalp hematoma without fracture. 2. No acute fracture or subluxation of the cervical spine. Multilevel degenerative change.   Electronically Signed   By: Jeb Levering M.D.   On: 10/29/2014 03:02   Ct Cervical Spine Wo Contrast  10/29/2014   CLINICAL DATA:  Syncope, fall 1 hour prior. Loss of consciousness, now with head pain and generalized neck pain.  EXAM: CT HEAD WITHOUT CONTRAST  CT CERVICAL SPINE WITHOUT CONTRAST  TECHNIQUE: Multidetector CT imaging of the head and cervical spine was performed following the standard protocol without intravenous contrast. Multiplanar CT image reconstructions of the cervical spine were also generated.  COMPARISON:  Cervical spine MRI 05/15/2014  FINDINGS: CT HEAD FINDINGS  Left parietal scalp hematoma. No associated fracture. No intracranial hemorrhage, mass effect, or midline shift. Mild white matter change, likely related to chronic small vessel ischemia. No hydrocephalus. The basilar cisterns are patent. No evidence of territorial infarct. No intracranial fluid collection. Calvarium is intact. Scattered mucosal thickening in the ethmoid air cells, paranasal sinuses are otherwise well-aerated. The mastoid air cells are well aerated.  CT CERVICAL SPINE FINDINGS  No acute fracture. Dens is intact. Reversal of normal lordosis, similar to prior exam. Mild anterolisthesis of C3 on C4, has progressed from prior, however appears degenerative. There is diffuse disc space narrowing and endplate spurring from G2-X5 through C6-C7. Multilevel facet arthropathy most prominent on the left that C2-C3 and C3-C4. No jumped or perched facets. Emphysema noted at the lung apices. There is a punctate calcification in the right thyroid lobe.  IMPRESSION: 1. No acute intracranial abnormality. Left parietal  scalp hematoma without fracture. 2. No acute fracture or subluxation of the cervical spine. Multilevel degenerative change.   Electronically Signed   By: Jeb Levering M.D.   On: 10/29/2014 03:02     EKG Interpretation   Date/Time:  Saturday October 29 2014 02:09:37 EST Ventricular Rate:  90 PR Interval:  153 QRS Duration: 93 QT Interval:  363 QTC Calculation: 444 R Axis:   57 Text Interpretation:  Sinus rhythm Confirmed by [facility]  MD,  Harl Wiechmann  ([id number]) on 10/29/2014 4:11:26 AM      MDM   Final diagnoses:  None    Will need a VQ as is allergic to dye  Case d/w Dr. Joylene Draft who will admit the patient    Alishba Naples K Sirenity Shew-Rasch, MD 10/29/14 [phone]

## 2014-10-29 NOTE — ED Notes (Signed)
Report given to Tim,RN.

## 2014-10-29 NOTE — ED Notes (Signed)
Pt. On monitor. 

## 2014-10-29 NOTE — ED Notes (Addendum)
Pt presents with c/o fall that occurred approx one hour ago. Pt reports that he had a syncopal episode, fell in the kitchen, had a positive LOC and hit his head. Pt also has some lacerations to his left knuckles. Pt also has a severe cough that he reports he has had for 4 weeks. Pt also reports he has had a couple of drinks tonight.

## 2014-10-29 NOTE — ED Notes (Signed)
Per Dr. Randal Buba, patient to have VQ scan due to allergy to IVP dye EDP at bedside to speak with patient

## 2014-10-29 NOTE — ED Notes (Signed)
He is in no distress; and is taken to Nuc. Med. At this time for VQ scan.  I have notified Marissa, RN of this on [address].

## 2014-10-29 NOTE — ED Notes (Signed)
Patient ambulatory from triage without difficulty Patient alert and oriented x 4 and in NAD

## 2014-10-29 NOTE — ED Notes (Signed)
Patient provided with urinal Patient informed that due to elevated D-dimer, patient is not to ambulate to bathroom Patient v/u

## 2014-10-29 NOTE — ED Notes (Signed)
Patient arrived to ED with Holter monitor in place Patient removed monitor himself when getting undressed  EDP aware

## 2014-10-30 DIAGNOSIS — I119 Hypertensive heart disease without heart failure: Secondary | ICD-10-CM | POA: Diagnosis present

## 2014-10-30 MED ORDER — TRAMADOL HCL 50 MG PO TABS: 50.0000 mg | ORAL_TABLET | Freq: Four times a day (QID) | ORAL | Status: DC | PRN

## 2014-10-30 MED ORDER — AZITHROMYCIN 250 MG PO TABS: ORAL_TABLET | ORAL | Status: DC

## 2014-10-30 NOTE — Discharge Summary (Addendum)
Physician Discharge Summary  Patient ID: Brad Turner MRN: [id number] DOB/AGE: August 12, 1941 74 y.o.  Admit date: 10/29/2014 Discharge date: 10/30/2014   Discharge Diagnoses:  Principal Problem:   Syncope Active Problems:   Coronary atherosclerosis   Essential hypertension, benign   COPD (chronic obstructive pulmonary disease) with emphysema   Discharged Condition: good  Hospital Course: The patient is a 74 year old man with a history of remote stroke, hypertension, hyperlipidemia, coronary artery disease, gastroesophageal reflux, and chronic cigarette use who presented to the emergency room after 4 episodes of syncope associated with bouts of coughing. His last episode occurred shortly before coming to the emergency room when he had a bout of coughing followed by syncope during which he hit the posterior part of his scalp. Workup in the emergency room included a CT scan of the head not showing evidence of intracranial abnormality and a elevated D-dimer test. He has a contrast dye allergy, so a VQ scan was requested. He was admitted for further evaluation. He is admitted to the telemetry bed and showed no arrhythmia during his hospitalization. He had a VQ scan done which did not show evidence of pulmonary embolism. He has continued to have some aching discomfort at the site of a hematoma in the left parietal scalp which is treated with ice compresses and tramadol. On the [place] of discharge he was alert and well oriented without significant headache, change in vision, or nausea. He had breakfast without difficulty.  Consults: None  Significant Diagnostic Studies:  Dg Chest 2 View  10/29/2014   CLINICAL DATA:  Fall, head injury.  Loss of consciousness.  EXAM: CHEST  2 VIEW  COMPARISON:  04/28/2014  FINDINGS: Heart at the upper limits of normal in size, mediastinal contours are normal. The lungs remain hyperinflated with emphysematous change. No consolidation, pleural effusion, or pneumothorax. No  acute osseous abnormalities. Degenerative change again seen in the thoracic spine.  IMPRESSION: No acute pulmonary process.   Electronically Signed   By: Jeb Levering M.D.   On: 10/29/2014 03:05   Ct Head Wo Contrast  10/29/2014   CLINICAL DATA:  Syncope, fall 1 hour prior. Loss of consciousness, now with head pain and generalized neck pain.  EXAM: CT HEAD WITHOUT CONTRAST  CT CERVICAL SPINE WITHOUT CONTRAST  TECHNIQUE: Multidetector CT imaging of the head and cervical spine was performed following the standard protocol without intravenous contrast. Multiplanar CT image reconstructions of the cervical spine were also generated.  COMPARISON:  Cervical spine MRI 05/15/2014  FINDINGS: CT HEAD FINDINGS  Left parietal scalp hematoma. No associated fracture. No intracranial hemorrhage, mass effect, or midline shift. Mild white matter change, likely related to chronic small vessel ischemia. No hydrocephalus. The basilar cisterns are patent. No evidence of territorial infarct. No intracranial fluid collection. Calvarium is intact. Scattered mucosal thickening in the ethmoid air cells, paranasal sinuses are otherwise well-aerated. The mastoid air cells are well aerated.  CT CERVICAL SPINE FINDINGS  No acute fracture. Dens is intact. Reversal of normal lordosis, similar to prior exam. Mild anterolisthesis of C3 on C4, has progressed from prior, however appears degenerative. There is diffuse disc space narrowing and endplate spurring from T5-V7 through C6-C7. Multilevel facet arthropathy most prominent on the left that C2-C3 and C3-C4. No jumped or perched facets. Emphysema noted at the lung apices. There is a punctate calcification in the right thyroid lobe.  IMPRESSION: 1. No acute intracranial abnormality. Left parietal scalp hematoma without fracture. 2. No acute fracture or subluxation of the cervical  spine. Multilevel degenerative change.   Electronically Signed   By: Jeb Levering M.D.   On: 10/29/2014 03:02    Ct Cervical Spine Wo Contrast  10/29/2014   CLINICAL DATA:  Syncope, fall 1 hour prior. Loss of consciousness, now with head pain and generalized neck pain.  EXAM: CT HEAD WITHOUT CONTRAST  CT CERVICAL SPINE WITHOUT CONTRAST  TECHNIQUE: Multidetector CT imaging of the head and cervical spine was performed following the standard protocol without intravenous contrast. Multiplanar CT image reconstructions of the cervical spine were also generated.  COMPARISON:  Cervical spine MRI 05/15/2014  FINDINGS: CT HEAD FINDINGS  Left parietal scalp hematoma. No associated fracture. No intracranial hemorrhage, mass effect, or midline shift. Mild white matter change, likely related to chronic small vessel ischemia. No hydrocephalus. The basilar cisterns are patent. No evidence of territorial infarct. No intracranial fluid collection. Calvarium is intact. Scattered mucosal thickening in the ethmoid air cells, paranasal sinuses are otherwise well-aerated. The mastoid air cells are well aerated.  CT CERVICAL SPINE FINDINGS  No acute fracture. Dens is intact. Reversal of normal lordosis, similar to prior exam. Mild anterolisthesis of C3 on C4, has progressed from prior, however appears degenerative. There is diffuse disc space narrowing and endplate spurring from A2-Z3 through C6-C7. Multilevel facet arthropathy most prominent on the left that C2-C3 and C3-C4. No jumped or perched facets. Emphysema noted at the lung apices. There is a punctate calcification in the right thyroid lobe.  IMPRESSION: 1. No acute intracranial abnormality. Left parietal scalp hematoma without fracture. 2. No acute fracture or subluxation of the cervical spine. Multilevel degenerative change.   Electronically Signed   By: Jeb Levering M.D.   On: 10/29/2014 03:02   Nm Pulmonary Perf And Vent  10/29/2014   CLINICAL DATA:  Shortness of breath for 1 month.  Syncope.  EXAM: NUCLEAR MEDICINE VENTILATION - PERFUSION LUNG SCAN  TECHNIQUE: Ventilation  images were obtained in multiple projections using inhaled aerosol technetium 99 M DTPA. Perfusion images were obtained in multiple projections after intravenous injection of Tc-50m MAA.  RADIOPHARMACEUTICALS:  43.4 mCi Tc-40m DTPA aerosol and 5.5 mCi Tc-51m MAA  COMPARISON:  Chest x-ray 10/29/2014  FINDINGS: Ventilation: Patchy scattered nonsegmental ventilation defects.  Perfusion: No suspicious segmental or subsegmental defect. Patchy scattered non segmental perfusion defects.  IMPRESSION: Low probability study for pulmonary embolus. Patchy ventilation and perfusion defects likely related to obstructive lung disease.   Electronically Signed   By: Rolm Baptise M.D.   On: 10/29/2014 10:16    Labs: Lab Results  Component Value Date   WBC 8.6 10/29/2014   HGB 12.6* 10/29/2014   HCT 37.0* 10/29/2014   MCV 94.5 10/29/2014   PLT 479* 10/29/2014      Recent Labs Lab 10/29/14 0342  NA 139  K 3.5  CL 101  BUN 15  CREATININE 1.40*  GLUCOSE 114*      No results found for: INR, PROTIME   No results found for this or any previous visit (from the past 240 hour(s)).  EKG telemetry results: He remained in a sinus rhythm throughout his hospitalization  Discharge Exam: Blood pressure 159/97, pulse 84, temperature 98.1 F (36.7 C), temperature source Oral, resp. rate 20, height 5\' 6"  (1.676 m), weight 68.4 kg (150 lb 12.7 oz), SpO2 90 %.  Physical Exam: In general, the patient is a well-nourished well-developed Caucasian man who was in no apparent distress while lying partially upright in bed. HEENT exam was significant for mild rhinitis, neck  was supple without jugular venous distention or carotid bruit, chest was clear to auscultation, heart had a regular rate and rhythm and was without significant murmur or gallop, abdomen had normal bowel sounds and no hepatosplenomegaly or tenderness, extremities were without cyanosis, clubbing, or edema. He was alert and well oriented with normal affect,  cranial nerves II through XII are normal, he had intact light touch sensation and muscle strength throughout all extremities. He had bilateral normal finger to nose testing.  Disposition: He will be discharged to home today and will continue taking Hycodan and tramadol as needed for pain or cough. He was strongly advised to continue efforts at complete cessation of cigarette smoking. He addition, his ACE inhibitor was discontinued. He was given a TDAP vaccination in the emergency room.      Discharge Instructions    Diet - low sodium heart healthy    Complete by:  As directed      Discharge instructions    Complete by:  As directed   Can use hycodan or tramadol to help with pain or cough. Can use tylenol as needed for pain. Stop ramipril as this could contribute to cough     Discharge wound care:    Complete by:  As directed   Today may apply ice to scalp bruise as needed for pain     Increase activity slowly    Complete by:  As directed             Medication List    STOP taking these medications        ramipril 10 MG tablet  Commonly known as:  ALTACE      TAKE these medications        albuterol 108 (90 BASE) MCG/ACT inhaler  Commonly known as:  PROVENTIL HFA;VENTOLIN HFA  Inhale 2 puffs into the lungs every 6 (six) hours as needed for wheezing or shortness of breath.     amLODipine 10 MG tablet  Commonly known as:  NORVASC  Take 5 mg by mouth every morning.     aspirin 81 MG tablet  Take 81 mg by mouth every morning.     atorvastatin 80 MG tablet  Commonly known as:  LIPITOR  Take 80 mg by mouth every morning.     azithromycin 250 MG tablet  Commonly known as:  ZITHROMAX  Take 2 tabs by mouth on Hada 1, then 1 tab daily for the next 4 days     clopidogrel 75 MG tablet  Commonly known as:  PLAVIX  Take 75 mg by mouth every morning.     DEXILANT 60 MG capsule  Generic drug:  dexlansoprazole  TAKE ONE CAPSULE BY MOUTH ONCE DAILY.     fenofibrate 145 MG tablet   Commonly known as:  TRICOR  Take 145 mg by mouth every morning.     FISH OIL PO  Take 1 capsule by mouth daily.     folic acid 858 MCG tablet  Commonly known as:  FOLVITE  Take 800 mcg by mouth daily.     HYDROcodone-homatropine 5-1.5 MG/5ML syrup  Commonly known as:  HYCODAN  Take 5 mLs by mouth every 6 (six) hours as needed for cough.     mirabegron ER 50 MG Tb24 tablet  Commonly known as:  MYRBETRIQ  Take 50 mg by mouth daily.     NITROSTAT 0.4 MG SL tablet  Generic drug:  nitroGLYCERIN  Place 0.4 mg under the tongue every 5 (five) minutes  as needed for chest pain.     SYMBICORT 160-4.5 MCG/ACT inhaler  Generic drug:  budesonide-formoterol  INHALE TWO PUFFS INTO LUNGS TWICE DAILY     tadalafil 5 MG tablet  Commonly known as:  CIALIS  Take 5 mg by mouth daily. For BPH     traMADol 50 MG tablet  Commonly known as:  ULTRAM  Take 1 tablet (50 mg total) by mouth every 6 (six) hours as needed for moderate pain.     valsartan 80 MG tablet  Commonly known as:  DIOVAN  Take 1 tablet (80 mg total) by mouth daily.       Follow-up Information    Follow up with Haywood Pao, MD. Schedule an appointment as soon as possible for a visit in 1 week.   Specialty:  Internal Medicine   Contact information:   [place] [address] [phone]       Signed: Donnajean Lopes 10/30/2014, 9:42 AM

## 2014-10-31 ENCOUNTER — Encounter (HOSPITAL_COMMUNITY): Payer: Self-pay

## 2014-11-01 ENCOUNTER — Encounter (HOSPITAL_COMMUNITY): Payer: Medicare Other

## 2014-11-01 DIAGNOSIS — E785 Hyperlipidemia, unspecified: Secondary | ICD-10-CM | POA: Insufficient documentation

## 2014-11-01 DIAGNOSIS — Z5189 Encounter for other specified aftercare: Secondary | ICD-10-CM | POA: Insufficient documentation

## 2014-11-01 DIAGNOSIS — I2510 Atherosclerotic heart disease of native coronary artery without angina pectoris: Secondary | ICD-10-CM | POA: Insufficient documentation

## 2014-11-03 ENCOUNTER — Encounter (HOSPITAL_COMMUNITY): Payer: Self-pay

## 2014-11-07 ENCOUNTER — Encounter (HOSPITAL_COMMUNITY)
Admission: RE | Admit: 2014-11-07 | Discharge: 2014-11-07 | Disposition: A | Discharge status: Home / Self Care | Payer: Self-pay | Source: Ambulatory Visit | Attending: Cardiology | Admitting: Cardiology

## 2014-11-08 ENCOUNTER — Encounter (HOSPITAL_COMMUNITY)
Admission: RE | Admit: 2014-11-08 | Discharge: 2014-11-08 | Disposition: A | Discharge status: Home / Self Care | Payer: Self-pay | Source: Ambulatory Visit | Attending: Cardiology | Admitting: Cardiology

## 2014-11-10 ENCOUNTER — Encounter (HOSPITAL_COMMUNITY)
Admission: RE | Admit: 2014-11-10 | Discharge: 2014-11-10 | Disposition: A | Discharge status: Home / Self Care | Payer: Self-pay | Source: Ambulatory Visit | Attending: Cardiology | Admitting: Cardiology

## 2014-11-14 ENCOUNTER — Encounter (HOSPITAL_COMMUNITY): Payer: Self-pay

## 2014-11-15 ENCOUNTER — Encounter (HOSPITAL_COMMUNITY)
Admission: RE | Admit: 2014-11-15 | Discharge: 2014-11-15 | Disposition: A | Discharge status: Home / Self Care | Payer: Self-pay | Source: Ambulatory Visit | Attending: Cardiology | Admitting: Cardiology

## 2014-11-16 ENCOUNTER — Other Ambulatory Visit: Payer: Self-pay | Admitting: Gastroenterology

## 2014-11-17 ENCOUNTER — Encounter (HOSPITAL_COMMUNITY)
Admission: RE | Admit: 2014-11-17 | Discharge: 2014-11-17 | Disposition: A | Discharge status: Home / Self Care | Payer: Self-pay | Source: Ambulatory Visit | Attending: Cardiology | Admitting: Cardiology

## 2014-11-21 ENCOUNTER — Encounter (HOSPITAL_COMMUNITY)
Admission: RE | Admit: 2014-11-21 | Discharge: 2014-11-21 | Disposition: A | Discharge status: Home / Self Care | Payer: Self-pay | Source: Ambulatory Visit | Attending: Cardiology | Admitting: Cardiology

## 2014-11-22 ENCOUNTER — Encounter (HOSPITAL_COMMUNITY): Payer: Self-pay

## 2014-11-24 ENCOUNTER — Encounter (HOSPITAL_COMMUNITY)
Admission: RE | Admit: 2014-11-24 | Discharge: 2014-11-24 | Disposition: A | Discharge status: Home / Self Care | Payer: Self-pay | Source: Ambulatory Visit | Attending: Cardiology | Admitting: Cardiology

## 2014-11-28 ENCOUNTER — Encounter (HOSPITAL_COMMUNITY)
Admission: RE | Admit: 2014-11-28 | Discharge: 2014-11-28 | Disposition: A | Discharge status: Home / Self Care | Payer: Self-pay | Source: Ambulatory Visit | Attending: Cardiology | Admitting: Cardiology

## 2014-11-29 ENCOUNTER — Encounter (HOSPITAL_COMMUNITY)
Admission: RE | Admit: 2014-11-29 | Discharge: 2014-11-29 | Disposition: A | Discharge status: Home / Self Care | Payer: Self-pay | Source: Ambulatory Visit | Attending: Cardiology | Admitting: Cardiology

## 2014-11-30 ENCOUNTER — Other Ambulatory Visit: Payer: Self-pay | Admitting: Gastroenterology

## 2014-12-01 ENCOUNTER — Encounter (HOSPITAL_COMMUNITY)
Admission: RE | Admit: 2014-12-01 | Discharge: 2014-12-01 | Disposition: A | Discharge status: Home / Self Care | Payer: Self-pay | Source: Ambulatory Visit | Attending: Cardiology | Admitting: Cardiology

## 2014-12-05 ENCOUNTER — Encounter (HOSPITAL_COMMUNITY): Payer: Medicare Other

## 2014-12-05 DIAGNOSIS — Z5189 Encounter for other specified aftercare: Secondary | ICD-10-CM | POA: Insufficient documentation

## 2014-12-05 DIAGNOSIS — E785 Hyperlipidemia, unspecified: Secondary | ICD-10-CM | POA: Insufficient documentation

## 2014-12-05 DIAGNOSIS — I2510 Atherosclerotic heart disease of native coronary artery without angina pectoris: Secondary | ICD-10-CM | POA: Insufficient documentation

## 2014-12-06 ENCOUNTER — Encounter (HOSPITAL_COMMUNITY)
Admission: RE | Admit: 2014-12-06 | Discharge: 2014-12-06 | Disposition: A | Discharge status: Home / Self Care | Payer: Self-pay | Source: Ambulatory Visit | Attending: Cardiology | Admitting: Cardiology

## 2014-12-08 ENCOUNTER — Encounter (HOSPITAL_COMMUNITY): Payer: Self-pay

## 2014-12-10 ENCOUNTER — Other Ambulatory Visit: Payer: Self-pay | Admitting: Gastroenterology

## 2014-12-12 ENCOUNTER — Encounter: Payer: Self-pay | Admitting: Pulmonary Disease

## 2014-12-12 ENCOUNTER — Encounter (HOSPITAL_COMMUNITY)
Admission: RE | Admit: 2014-12-12 | Discharge: 2014-12-12 | Disposition: A | Discharge status: Home / Self Care | Payer: Self-pay | Source: Ambulatory Visit | Attending: Cardiology | Admitting: Cardiology

## 2014-12-12 ENCOUNTER — Ambulatory Visit (INDEPENDENT_AMBULATORY_CARE_PROVIDER_SITE_OTHER): Payer: Medicare Other | Admitting: Pulmonary Disease

## 2014-12-12 VITALS — BP 112/70 | HR 85 | Temp 97.6°F | Ht 66.0 in | Wt 164.8 lb

## 2014-12-12 DIAGNOSIS — J438 Other emphysema: Secondary | ICD-10-CM | POA: Diagnosis not present

## 2014-12-12 NOTE — Patient Instructions (Signed)
Will try adding spiriva respimat 2 inhalations each am to your symbicort.  Let me know in 4 weeks if it helps, and can send in prescription. Work on quitting smoking. followup with Dr. Lenna Gilford in 58mos.

## 2014-12-12 NOTE — Assessment & Plan Note (Signed)
The patient feels that his breathing is near his usual baseline, and unfortunately continues to smoke. He is now off his prior drug study, and I would like to add an anti-cholinergic to his Symbicort to try and improve his dyspnea on exertion. I have also talked with him again about total smoking cessation, and help this is the key to getting better.

## 2014-12-12 NOTE — Progress Notes (Signed)
   Subjective:    Patient ID: Brad Turner, male    DOB: [birth year], 74 y.o.   MRN: [id number]  HPI The patient comes in today for follow-up of his known COPD. He is continuing to smoke, and continues to have chronic dyspnea on exertion and excessive mucus production. However, he feels that his symptoms are within his usual baseline. He has finished up his drug study, and currently is not on an anti-cholinergic.   Review of Systems  Constitutional: Negative for fever and unexpected weight change.  HENT: Positive for congestion and postnasal drip. Negative for dental problem, ear pain, nosebleeds, rhinorrhea, sinus pressure, sneezing, sore throat and trouble swallowing.   Eyes: Negative for redness and itching.  Respiratory: Positive for cough, shortness of breath and wheezing. Negative for chest tightness.   Cardiovascular: Positive for leg swelling. Negative for palpitations.  Gastrointestinal: Negative for nausea and vomiting.  Genitourinary: Negative for dysuria.  Musculoskeletal: Negative for joint swelling.  Skin: Negative for rash.  Neurological: Negative for headaches.  Hematological: Does not bruise/bleed easily.  Psychiatric/Behavioral: Negative for dysphoric mood. The patient is not nervous/anxious.        Objective:   Physical Exam Well-developed male in no acute distress Nose without purulence or discharge noted Neck without lymphadenopathy or thyromegaly Chest with decreased breath sounds, no wheezes or rhonchi Cardiac exam with regular rate and rhythm Lower extremities without edema, no cyanosis Alert and oriented, moves all 4 extremities.       Assessment & Plan:

## 2014-12-13 ENCOUNTER — Encounter (HOSPITAL_COMMUNITY): Payer: Self-pay

## 2014-12-15 ENCOUNTER — Encounter (HOSPITAL_COMMUNITY)
Admission: RE | Admit: 2014-12-15 | Discharge: 2014-12-15 | Disposition: A | Discharge status: Home / Self Care | Payer: Self-pay | Source: Ambulatory Visit | Attending: Cardiology | Admitting: Cardiology

## 2014-12-19 ENCOUNTER — Encounter (HOSPITAL_COMMUNITY)
Admission: RE | Admit: 2014-12-19 | Discharge: 2014-12-19 | Disposition: A | Discharge status: Home / Self Care | Payer: Self-pay | Source: Ambulatory Visit | Attending: Cardiology | Admitting: Cardiology

## 2014-12-20 ENCOUNTER — Encounter (HOSPITAL_COMMUNITY)
Admission: RE | Admit: 2014-12-20 | Discharge: 2014-12-20 | Disposition: A | Discharge status: Home / Self Care | Payer: Self-pay | Source: Ambulatory Visit | Attending: Cardiology | Admitting: Cardiology

## 2014-12-22 ENCOUNTER — Encounter (HOSPITAL_COMMUNITY)
Admission: RE | Admit: 2014-12-22 | Discharge: 2014-12-22 | Disposition: A | Discharge status: Home / Self Care | Payer: Self-pay | Source: Ambulatory Visit | Attending: Cardiology | Admitting: Cardiology

## 2014-12-26 ENCOUNTER — Encounter (HOSPITAL_COMMUNITY)
Admission: RE | Admit: 2014-12-26 | Discharge: 2014-12-26 | Disposition: A | Discharge status: Home / Self Care | Payer: Self-pay | Source: Ambulatory Visit | Attending: Cardiology | Admitting: Cardiology

## 2014-12-27 ENCOUNTER — Encounter (HOSPITAL_COMMUNITY)
Admission: RE | Admit: 2014-12-27 | Discharge: 2014-12-27 | Disposition: A | Discharge status: Home / Self Care | Payer: Self-pay | Source: Ambulatory Visit | Attending: Cardiology | Admitting: Cardiology

## 2014-12-29 ENCOUNTER — Other Ambulatory Visit: Payer: Self-pay | Admitting: Orthopedic Surgery

## 2014-12-29 ENCOUNTER — Encounter (HOSPITAL_COMMUNITY): Payer: Self-pay

## 2015-01-02 ENCOUNTER — Encounter (HOSPITAL_COMMUNITY)
Admission: RE | Admit: 2015-01-02 | Discharge: 2015-01-02 | Disposition: A | Discharge status: Home / Self Care | Payer: Self-pay | Source: Ambulatory Visit | Attending: Cardiology | Admitting: Cardiology

## 2015-01-02 DIAGNOSIS — I2510 Atherosclerotic heart disease of native coronary artery without angina pectoris: Secondary | ICD-10-CM | POA: Insufficient documentation

## 2015-01-02 DIAGNOSIS — Z5189 Encounter for other specified aftercare: Secondary | ICD-10-CM | POA: Insufficient documentation

## 2015-01-02 DIAGNOSIS — E785 Hyperlipidemia, unspecified: Secondary | ICD-10-CM | POA: Insufficient documentation

## 2015-01-03 ENCOUNTER — Encounter (HOSPITAL_COMMUNITY)
Admission: RE | Admit: 2015-01-03 | Discharge: 2015-01-03 | Disposition: A | Discharge status: Home / Self Care | Payer: Self-pay | Source: Ambulatory Visit | Attending: Cardiology | Admitting: Cardiology

## 2015-01-05 ENCOUNTER — Encounter (HOSPITAL_COMMUNITY): Payer: Self-pay

## 2015-01-09 ENCOUNTER — Encounter (HOSPITAL_COMMUNITY)
Admission: RE | Admit: 2015-01-09 | Discharge: 2015-01-09 | Disposition: A | Discharge status: Home / Self Care | Payer: Medicare Other | Source: Ambulatory Visit | Attending: Orthopedic Surgery | Admitting: Orthopedic Surgery

## 2015-01-09 ENCOUNTER — Encounter (HOSPITAL_COMMUNITY): Payer: Self-pay

## 2015-01-09 ENCOUNTER — Encounter (HOSPITAL_COMMUNITY)
Admission: RE | Admit: 2015-01-09 | Discharge: 2015-01-09 | Disposition: A | Discharge status: Home / Self Care | Payer: Self-pay | Source: Ambulatory Visit | Attending: Cardiology | Admitting: Cardiology

## 2015-01-09 DIAGNOSIS — Z87442 Personal history of urinary calculi: Secondary | ICD-10-CM | POA: Diagnosis not present

## 2015-01-09 DIAGNOSIS — Z9861 Coronary angioplasty status: Secondary | ICD-10-CM | POA: Diagnosis not present

## 2015-01-09 DIAGNOSIS — I2510 Atherosclerotic heart disease of native coronary artery without angina pectoris: Secondary | ICD-10-CM | POA: Diagnosis not present

## 2015-01-09 DIAGNOSIS — F17210 Nicotine dependence, cigarettes, uncomplicated: Secondary | ICD-10-CM | POA: Diagnosis not present

## 2015-01-09 DIAGNOSIS — Z8673 Personal history of transient ischemic attack (TIA), and cerebral infarction without residual deficits: Secondary | ICD-10-CM | POA: Diagnosis not present

## 2015-01-09 DIAGNOSIS — Z85828 Personal history of other malignant neoplasm of skin: Secondary | ICD-10-CM | POA: Diagnosis not present

## 2015-01-09 DIAGNOSIS — N400 Enlarged prostate without lower urinary tract symptoms: Secondary | ICD-10-CM | POA: Diagnosis not present

## 2015-01-09 DIAGNOSIS — J449 Chronic obstructive pulmonary disease, unspecified: Secondary | ICD-10-CM | POA: Diagnosis not present

## 2015-01-09 DIAGNOSIS — Z7982 Long term (current) use of aspirin: Secondary | ICD-10-CM | POA: Diagnosis not present

## 2015-01-09 DIAGNOSIS — M4806 Spinal stenosis, lumbar region: Secondary | ICD-10-CM | POA: Diagnosis not present

## 2015-01-09 DIAGNOSIS — Z9049 Acquired absence of other specified parts of digestive tract: Secondary | ICD-10-CM | POA: Diagnosis not present

## 2015-01-09 DIAGNOSIS — Z91041 Radiographic dye allergy status: Secondary | ICD-10-CM | POA: Diagnosis not present

## 2015-01-09 DIAGNOSIS — Z888 Allergy status to other drugs, medicaments and biological substances status: Secondary | ICD-10-CM | POA: Diagnosis not present

## 2015-01-09 DIAGNOSIS — K219 Gastro-esophageal reflux disease without esophagitis: Secondary | ICD-10-CM | POA: Diagnosis not present

## 2015-01-09 DIAGNOSIS — F329 Major depressive disorder, single episode, unspecified: Secondary | ICD-10-CM | POA: Diagnosis not present

## 2015-01-09 DIAGNOSIS — E785 Hyperlipidemia, unspecified: Secondary | ICD-10-CM | POA: Diagnosis not present

## 2015-01-09 HISTORY — DX: Depression, unspecified: F32.A

## 2015-01-09 HISTORY — DX: Chronic obstructive pulmonary disease, unspecified: J44.9

## 2015-01-09 HISTORY — DX: Personal history of urinary calculi: Z87.442

## 2015-01-09 HISTORY — DX: Emphysema, unspecified: J43.9

## 2015-01-09 HISTORY — DX: Benign prostatic hyperplasia without lower urinary tract symptoms: N40.0

## 2015-01-09 HISTORY — DX: Barrett's esophagus without dysplasia: K22.70

## 2015-01-09 HISTORY — DX: Pneumonia, unspecified organism: J18.9

## 2015-01-09 HISTORY — DX: Major depressive disorder, single episode, unspecified: F32.9

## 2015-01-09 HISTORY — DX: Cerebral infarction, unspecified: I63.9

## 2015-01-09 HISTORY — DX: Urgency of urination: R39.15

## 2015-01-09 HISTORY — DX: Unspecified malignant neoplasm of skin, unspecified: C44.90

## 2015-01-09 LAB — COMPREHENSIVE METABOLIC PANEL
ALK PHOS: 75 U/L (ref 38–126)
ALT: 20 U/L (ref 17–63)
AST: 39 U/L (ref 15–41)
Albumin: 3.9 g/dL (ref 3.5–5.0)
Anion gap: 9 (ref 5–15)
BILIRUBIN TOTAL: 0.9 mg/dL (ref 0.3–1.2)
BUN: 16 mg/dL (ref 6–20)
CO2: 25 mmol/L (ref 22–32)
Calcium: 9.2 mg/dL (ref 8.9–10.3)
Chloride: 105 mmol/L (ref 101–111)
Creatinine, Ser: 1.26 mg/dL — ABNORMAL HIGH (ref 0.61–1.24)
GFR, EST NON AFRICAN AMERICAN: 56 mL/min — AB (ref >60)
GLUCOSE: 118 mg/dL — AB (ref 70–99)
POTASSIUM: 3.8 mmol/L (ref 3.5–5.1)
Sodium: 139 mmol/L (ref 135–145)
Total Protein: 7 g/dL (ref 6.5–8.1)

## 2015-01-09 LAB — CBC WITH DIFFERENTIAL/PLATELET
BASOS PCT: 1 % (ref 0–1)
Basophils Absolute: 0 10*3/uL (ref 0.0–0.1)
Eosinophils Absolute: 0.1 10*3/uL (ref 0.0–0.7)
Eosinophils Relative: 2 % (ref 0–5)
HCT: 43.2 % (ref 39.0–52.0)
Hemoglobin: 14.3 g/dL (ref 13.0–17.0)
LYMPHS PCT: 33 % (ref 12–46)
Lymphs Abs: 2 10*3/uL (ref 0.7–4.0)
MCH: 31.2 pg (ref 26.0–34.0)
MCHC: 33.1 g/dL (ref 30.0–36.0)
MCV: 94.1 fL (ref 78.0–100.0)
Monocytes Absolute: 0.5 10*3/uL (ref 0.1–1.0)
Monocytes Relative: 8 % (ref 3–12)
NEUTROS ABS: 3.5 10*3/uL (ref 1.7–7.7)
Neutrophils Relative %: 56 % (ref 43–77)
PLATELETS: 341 10*3/uL (ref 150–400)
RBC: 4.59 MIL/uL (ref 4.22–5.81)
RDW: 14.2 % (ref 11.5–15.5)
WBC: 6.2 10*3/uL (ref 4.0–10.5)

## 2015-01-09 LAB — URINALYSIS, ROUTINE W REFLEX MICROSCOPIC
Bilirubin Urine: NEGATIVE
Glucose, UA: NEGATIVE mg/dL
HGB URINE DIPSTICK: NEGATIVE
Ketones, ur: NEGATIVE mg/dL
Leukocytes, UA: NEGATIVE
NITRITE: NEGATIVE
Protein, ur: NEGATIVE mg/dL
SPECIFIC GRAVITY, URINE: 1.014 (ref 1.005–1.030)
UROBILINOGEN UA: 1 mg/dL (ref 0.0–1.0)
pH: 6 (ref 5.0–8.0)

## 2015-01-09 LAB — PROTIME-INR
INR: 1.02 (ref 0.00–1.49)
Prothrombin Time: 13.5 seconds (ref 11.6–15.2)

## 2015-01-09 LAB — SURGICAL PCR SCREEN
MRSA, PCR: NEGATIVE
Staphylococcus aureus: NEGATIVE

## 2015-01-09 LAB — APTT: aPTT: 28 seconds (ref 24–37)

## 2015-01-09 NOTE — Progress Notes (Signed)
PCP is Stage manager, Cardiologist is Dr. Wynonia Lawman, Neurologist is Zoila Shutter, and patient goes to [facility] when needed (patient stated he noticed blood in his urine a few months ago; denied having seen any thereafter). Patient informed Nurse that he had a tilt table test done in [facility].  Patient informed Nurse that he stopped taking Plavix and aspirin on Friday May 6th.

## 2015-01-09 NOTE — Pre-Procedure Instructions (Signed)
Brad Turner  01/09/2015   Your procedure is scheduled on:  Thursday Jan 12, 2015 at 12:15 AM.  Report to [facility] Admitting at 9:15 AM.  Call this number if you have problems the morning of surgery: [phone]   Remember:   Do not eat food or drink liquids after midnight.   Take these medicines the morning of surgery with A SIP OF WATER: Acetaminophen (Tylenol) if needed, Albuterol inhaler if needed, Amlodipine (Norvasc), Dexilant, Eye drops, Symbicort inhaler   Please stop taking any vitamins, herbal medications, Advil, Motrin, Alleve, etc   Please follow your physicians instructions regarding Plavix   Do not wear jewelry.  Do not wear lotions, powders, or cologne.   Men may shave face and neck.  Do not bring valuables to the hospital.  [facility] is not responsible for any belongings or valuables.               Contacts, dentures or bridgework may not be worn into surgery.  Leave suitcase in the car. After surgery it may be brought to your room.  For patients admitted to the hospital, discharge time is determined by your treatment team.               Patients discharged the [place] of surgery will not be allowed to drive home.  Name and phone number of your driver:   Special Instructions: Shower using CHG soap the night before and the morning of your surgery   Please read over the following fact sheets that you were given: Pain Booklet, Coughing and Deep Breathing, MRSA Information and Surgical Site Infection Prevention

## 2015-01-10 ENCOUNTER — Encounter (HOSPITAL_COMMUNITY): Payer: Self-pay

## 2015-01-10 NOTE — Progress Notes (Addendum)
Anesthesia Chart Review:  Patient is a 74 year old male scheduled for L3-4, L4-5, L5-S1 decompression on 01/12/15 by Dr. Lynann Bologna.  History includes smoking, COPD, CAD s/p PTCA OM2 '11, CVA, GERD, HLD, HTN, wet macular degeneration, depression, BPH, skin cancer, Barrett's esophagus. He was hospitalized 10/29/14-10/30/14 after four episodes of post-cough syncope with subsequent fall with scalp hematoma.   PCP is Dr. Osborne Casco, last visit 11/01/14 for follow-up syncope. Cardiologist is Dr. Wynonia Lawman, last visit 11/08/14 for follow-up syncope. Neurologist is Dr. Verdene Rio with reported recent follow-up and a tilt table test. Pulmonologist is Dr. Gwenette Greet. According to PCP and cardiology notes, the most likely etiology of his syncope is cough induced syncope.  Events happened while off ACEI, so this was not felt to be a contributor.  Dr. Osborne Casco did not feel he had pertussis, and felt his continued smoking was a high contributor to his chronic cough. Dr. Wynonia Lawman recommended considering stopping Myrbetriq and/or Cialis to see if they were contributing.  Patient was resistant to stopping   Meds include albuterol, ASA, amlodipine, Plavix, Lipitor, Dexilant, Tricor, Folvite, Nitro, Fish oil, Symbicort, Cialis, valsartan.  PAT Vitals: T 36.7, HR 79, RR 18, BP 142/80, O2 sat 98%.  10/29/14 EKG: SR.  10/10/14 Echo (Dr. Wynonia Lawman): Moderate concentric LVH with normal LV systolic function, estimated EF 65%. Doppler evidence of grade 1 diastolic dysfunction. Mild to moderate TR with mild elevation of RVSP. Mild MR. Trace PR.   By notes, recent event monitor (which was being worn during at least one of his recent syncopal episodes) showed no evidence of bradycardia or tachycardia arrhythmias.  Tilt table test results are still pending from his neurologist.  11/06/09 Cardiac cath: LV is normal in size. Estimated EF 60%. Calcification in teh left coronary system. LM NL. LAD with scattered irregularities, but no severe focal obstructive  stenoses. CX is a large codominant vessel. OM2 with mild ostial stenosis. OM2 has long segmental proximal stenosis at 40-505% then segmental stenosis 60-70% followed by a focal eccentric stenosis of approximately 90%. RCA with mild irregularities and ends in a single PDA. S/P PTCA distal OM2 11/06/09 reducing 95% to < 25% with TIMI grade 3 flow.    10/29/14 CXR: No acute pulmonary process.  10/29/14 Head/Neck CT: IMPRESSION: 1. No acute intracranial abnormality. Left parietal scalp hematoma without fracture. 2. No acute fracture or subluxation of the cervical spine. Multilevel degenerative change.  Preoperative labs noted.   I reviewed currently available information with anesthesiologist Dr. Marcie Bal.  Patient has been seen by cardiology and PCP since his syncopal episodes.  Echo and event monitor showed no obvious source. Coughing felt to be a contributor.  Tilt test is still pending.  Based on currently available information it is anticipated that he can proceed as planned if no acute changes. In regards to his Plavix/ASA, I left a message with Angela Nevin at Dr. Laurena Bering office asking to confirm that patient was given pre-operative instructions regarding these.  George Hugh [facility] Short Stay Center/Anesthesiology Phone [phone] 01/10/2015 5:36 PM  Addendum: Angela Nevin confirmed that patient had been instructed to hold ASA/Plavix for five days pre-operatively.  I called Dr. Sherle Poe office to follow-up on the tilt table results.  Our initial fax request apparently was faxed to another [place] office; however, Dr. Sherle Poe staff said they did not have a copy of the test at their office, so to try [facility].  Fax request sent to [facility] this afternoon for the tilt table results as available.  Ebony Hail  [facility] Short Stay Center/Anesthesiology Phone [phone] 01/11/2015 4:29 PM

## 2015-01-11 MED ORDER — CEFAZOLIN SODIUM-DEXTROSE 2-3 GM-% IV SOLR
2.0000 g | INTRAVENOUS | Status: AC
  Administered 2015-01-12 (×2): 2 g via INTRAVENOUS
  Filled 2015-01-11: qty 50

## 2015-01-11 NOTE — H&P (Signed)
PREOPERATIVE H&P  Chief Complaint: Bilateral leg pain  HPI: Tiwan Schnitker Romick is a 74 y.o. male who presents with ongoing pain in the bilateral legs  MRI reveals stenosis L4-S1  Patient has failed multiple forms of conservative care and continues to have pain (see office notes for additional details regarding the patient's full course of treatment)  Past Medical History  Diagnosis Date  . CVA (cerebral infarction)   . Hyperlipidemia   . GERD (gastroesophageal reflux disease)   . CAD (coronary artery disease)   . Arthritis   . Hypertension   . Cataracts, bilateral   . Macular degeneration, wet     right eye  . Stroke     Affected balance; and has very emotional if watching a movie  . COPD (chronic obstructive pulmonary disease)   . Emphysema lung   . Pneumonia     hx of  . Depression   . BPH (benign prostatic hyperplasia)   . Urgency of urination     and frequency of urination at bedtime  . History of kidney stones   . Skin cancer   . Barrett esophagus    Past Surgical History  Procedure Laterality Date  . Appendectomy    . Angioplasty      x4  . Tonsillectomy    . Rotator cuff repair      bilateral  . Perithyroid    . Cataracts Bilateral   . Cystoscopy  07/21/2013    Dr. Jeffie Pollock  . Cardiac catheterization     History   Social History  . Marital Status: Single    Spouse Name: N/A  . Number of Children: 1  . Years of Education: N/A   Occupational History  . PROFESSOR OF [employer]    Social History Main Topics  . Smoking status: Current Every [place] Smoker -- 1.50 packs/Bacha for 58 years    Types: Cigarettes  . Smokeless tobacco: Never Used     Comment: Tobaccco info given 10/15/13  . Alcohol Use: 7.0 oz/week    14 Standard drinks or equivalent per week     Comment: every [place], vodka  . Drug Use: No  . Sexual Activity: Not on file   Other Topics Concern  . Not on file   Social History Narrative   Family History  Problem Relation Age of Onset  .  Stroke Mother   . Heart disease Father   . Rheum arthritis Mother   . Colon cancer      ? mat. uncle died age 22   Allergies  Allergen Reactions  . Ivp Dye [Iodinated Diagnostic Agents] Hives and Shortness Of Breath  . Nitrofurantoin Other (See Comments)    Fever that lasted several months   Prior to Admission medications   Medication Sig Start Date End Date Taking? Authorizing Provider  acetaminophen (TYLENOL) 325 MG tablet Take 325 mg by mouth every 6 (six) hours as needed for mild pain or moderate pain.   Yes Historical Provider, MD  albuterol (PROVENTIL HFA;VENTOLIN HFA) 108 (90 BASE) MCG/ACT inhaler Inhale 2 puffs into the lungs every 6 (six) hours as needed for wheezing or shortness of breath.    Yes Historical Provider, MD  amLODipine (NORVASC) 5 MG tablet Take 2.5 mg by mouth daily.   Yes Historical Provider, MD  aspirin 81 MG tablet Take 81 mg by mouth every morning.    Yes Historical Provider, MD  atorvastatin (LIPITOR) 80 MG tablet Take 80 mg by mouth every  morning.    Yes Historical Provider, MD  clopidogrel (PLAVIX) 75 MG tablet Take 75 mg by mouth every morning.    Yes Historical Provider, MD  DEXILANT 60 MG capsule TAKE ONE CAPSULE BY MOUTH ONCE DAILY 11/30/14  Yes Inda Castle, MD  fenofibrate (TRICOR) 145 MG tablet Take 145 mg by mouth every morning.    Yes Historical Provider, MD  folic acid (FOLVITE) 008 MCG tablet Take 800 mcg by mouth daily.   Yes Historical Provider, MD  hydroxypropyl methylcellulose / hypromellose (ISOPTO TEARS / GONIOVISC) 2.5 % ophthalmic solution Place 1 drop into both eyes as needed for dry eyes.   Yes Historical Provider, MD  NITROSTAT 0.4 MG SL tablet Place 0.4 mg under the tongue every 5 (five) minutes as needed for chest pain.  05/29/13  Yes Historical Provider, MD  Omega-3 Fatty Acids (FISH OIL PO) Take 1 capsule by mouth daily.   Yes Historical Provider, MD  SYMBICORT 160-4.5 MCG/ACT inhaler INHALE TWO PUFFS INTO LUNGS TWICE DAILY 04/19/14   Yes Kathee Delton, MD  tadalafil (CIALIS) 5 MG tablet Take 5 mg by mouth daily. For BPH   Yes Historical Provider, MD  valsartan (DIOVAN) 80 MG tablet Take 1 tablet (80 mg total) by mouth daily. 10/07/14  Yes Kathee Delton, MD  DEXILANT 60 MG capsule TAKE ONE CAPSULE BY MOUTH ONCE DAILY Patient not taking: Reported on 01/05/2015 12/12/14   Inda Castle, MD     All other systems have been reviewed and were otherwise negative with the exception of those mentioned in the HPI and as above.  Physical Exam: There were no vitals filed for this visit.  General: Alert, no acute distress Cardiovascular: No pedal edema Respiratory: No cyanosis, no use of accessory musculature Skin: No lesions in the area of chief complaint Neurologic: Sensation intact distally Psychiatric: Patient is competent for consent with normal mood and affect Lymphatic: No axillary or cervical lymphadenopathy   Assessment/Plan: Bilateral leg pain Plan for Procedure(s): LUMBAR DECOMPRESSION 3 LEVELS: L3-S1   Sinclair Ship, MD 01/11/2015 1:55 PM

## 2015-01-12 ENCOUNTER — Ambulatory Visit (HOSPITAL_COMMUNITY): Payer: Medicare Other

## 2015-01-12 ENCOUNTER — Ambulatory Visit (HOSPITAL_COMMUNITY): Payer: Medicare Other | Admitting: Vascular Surgery

## 2015-01-12 ENCOUNTER — Ambulatory Visit (HOSPITAL_COMMUNITY): Payer: Medicare Other | Admitting: Anesthesiology

## 2015-01-12 ENCOUNTER — Encounter (HOSPITAL_COMMUNITY): Admission: RE | Admit: 2015-01-12 | Payer: Self-pay | Source: Ambulatory Visit

## 2015-01-12 ENCOUNTER — Encounter (HOSPITAL_COMMUNITY): Payer: Self-pay | Admitting: *Deleted

## 2015-01-12 ENCOUNTER — Ambulatory Visit (HOSPITAL_COMMUNITY)
Admission: RE | Admit: 2015-01-12 | Discharge: 2015-01-12 | Disposition: A | Discharge status: Home / Self Care | Payer: Medicare Other | Source: Ambulatory Visit | Attending: Orthopedic Surgery | Admitting: Orthopedic Surgery

## 2015-01-12 ENCOUNTER — Encounter (HOSPITAL_COMMUNITY)
Admission: RE | Disposition: A | Discharge status: Home / Self Care | Payer: Medicare Other | Source: Ambulatory Visit | Attending: Orthopedic Surgery

## 2015-01-12 DIAGNOSIS — I2510 Atherosclerotic heart disease of native coronary artery without angina pectoris: Secondary | ICD-10-CM | POA: Insufficient documentation

## 2015-01-12 DIAGNOSIS — E785 Hyperlipidemia, unspecified: Secondary | ICD-10-CM | POA: Diagnosis not present

## 2015-01-12 DIAGNOSIS — Z85828 Personal history of other malignant neoplasm of skin: Secondary | ICD-10-CM | POA: Insufficient documentation

## 2015-01-12 DIAGNOSIS — F17210 Nicotine dependence, cigarettes, uncomplicated: Secondary | ICD-10-CM | POA: Insufficient documentation

## 2015-01-12 DIAGNOSIS — F329 Major depressive disorder, single episode, unspecified: Secondary | ICD-10-CM | POA: Insufficient documentation

## 2015-01-12 DIAGNOSIS — Z9861 Coronary angioplasty status: Secondary | ICD-10-CM | POA: Insufficient documentation

## 2015-01-12 DIAGNOSIS — Z7982 Long term (current) use of aspirin: Secondary | ICD-10-CM | POA: Insufficient documentation

## 2015-01-12 DIAGNOSIS — Z9049 Acquired absence of other specified parts of digestive tract: Secondary | ICD-10-CM | POA: Insufficient documentation

## 2015-01-12 DIAGNOSIS — Z419 Encounter for procedure for purposes other than remedying health state, unspecified: Secondary | ICD-10-CM

## 2015-01-12 DIAGNOSIS — J449 Chronic obstructive pulmonary disease, unspecified: Secondary | ICD-10-CM | POA: Insufficient documentation

## 2015-01-12 DIAGNOSIS — M4806 Spinal stenosis, lumbar region: Secondary | ICD-10-CM | POA: Diagnosis not present

## 2015-01-12 DIAGNOSIS — N400 Enlarged prostate without lower urinary tract symptoms: Secondary | ICD-10-CM | POA: Insufficient documentation

## 2015-01-12 DIAGNOSIS — Z91041 Radiographic dye allergy status: Secondary | ICD-10-CM | POA: Insufficient documentation

## 2015-01-12 DIAGNOSIS — K219 Gastro-esophageal reflux disease without esophagitis: Secondary | ICD-10-CM | POA: Insufficient documentation

## 2015-01-12 DIAGNOSIS — Z87442 Personal history of urinary calculi: Secondary | ICD-10-CM | POA: Insufficient documentation

## 2015-01-12 DIAGNOSIS — Z888 Allergy status to other drugs, medicaments and biological substances status: Secondary | ICD-10-CM | POA: Insufficient documentation

## 2015-01-12 DIAGNOSIS — Z8673 Personal history of transient ischemic attack (TIA), and cerebral infarction without residual deficits: Secondary | ICD-10-CM | POA: Insufficient documentation

## 2015-01-12 HISTORY — PX: LUMBAR LAMINECTOMY/DECOMPRESSION MICRODISCECTOMY: SHX5026

## 2015-01-12 SURGERY — LUMBAR LAMINECTOMY/DECOMPRESSION MICRODISCECTOMY 3 LEVELS
Anesthesia: General | Site: Back

## 2015-01-12 MED ORDER — 0.9 % SODIUM CHLORIDE (POUR BTL) OPTIME
TOPICAL | Status: DC | PRN
  Administered 2015-01-12 (×2): 1000 mL

## 2015-01-12 MED ORDER — MIDAZOLAM HCL 2 MG/2ML IJ SOLN
INTRAMUSCULAR | Status: AC
  Filled 2015-01-12: qty 2

## 2015-01-12 MED ORDER — SODIUM CHLORIDE 0.9 % IV SOLN
10.0000 mg | INTRAVENOUS | Status: DC | PRN
  Administered 2015-01-12: 10 ug/min via INTRAVENOUS

## 2015-01-12 MED ORDER — METHYLPREDNISOLONE ACETATE 40 MG/ML IJ SUSP
INTRAMUSCULAR | Status: AC
  Filled 2015-01-12: qty 1

## 2015-01-12 MED ORDER — THROMBIN 20000 UNITS EX KIT
PACK | CUTANEOUS | Status: DC | PRN
  Administered 2015-01-12: 20 mL via TOPICAL

## 2015-01-12 MED ORDER — PROPOFOL 10 MG/ML IV BOLUS
INTRAVENOUS | Status: DC | PRN
  Administered 2015-01-12: 130 mg via INTRAVENOUS
  Administered 2015-01-12: 20 mg via INTRAVENOUS

## 2015-01-12 MED ORDER — KETOROLAC TROMETHAMINE 30 MG/ML IJ SOLN: 30.0000 mg | Freq: Once | INTRAMUSCULAR | Status: DC | PRN

## 2015-01-12 MED ORDER — THROMBIN 20000 UNITS EX SOLR
CUTANEOUS | Status: AC
  Filled 2015-01-12: qty 20000

## 2015-01-12 MED ORDER — PROPOFOL 10 MG/ML IV BOLUS
INTRAVENOUS | Status: AC
  Filled 2015-01-12: qty 20

## 2015-01-12 MED ORDER — LACTATED RINGERS IV SOLN
INTRAVENOUS | Status: DC
  Administered 2015-01-12 (×2): via INTRAVENOUS

## 2015-01-12 MED ORDER — HYDROMORPHONE HCL 1 MG/ML IJ SOLN
INTRAMUSCULAR | Status: AC
  Administered 2015-01-12: 0.5 mg via INTRAVENOUS
  Filled 2015-01-12: qty 1

## 2015-01-12 MED ORDER — FENTANYL CITRATE (PF) 250 MCG/5ML IJ SOLN
INTRAMUSCULAR | Status: AC
  Filled 2015-01-12: qty 5

## 2015-01-12 MED ORDER — HYDROMORPHONE HCL 1 MG/ML IJ SOLN
0.2500 mg | INTRAMUSCULAR | Status: DC | PRN
  Administered 2015-01-12 (×2): 0.5 mg via INTRAVENOUS

## 2015-01-12 MED ORDER — ROCURONIUM BROMIDE 100 MG/10ML IV SOLN
INTRAVENOUS | Status: DC | PRN
  Administered 2015-01-12: 60 mg via INTRAVENOUS
  Administered 2015-01-12: 40 mg via INTRAVENOUS

## 2015-01-12 MED ORDER — METHYLPREDNISOLONE ACETATE 40 MG/ML IJ SUSP
INTRAMUSCULAR | Status: DC | PRN
  Administered 2015-01-12: 80 mg

## 2015-01-12 MED ORDER — GLYCOPYRROLATE 0.2 MG/ML IJ SOLN
INTRAMUSCULAR | Status: DC | PRN
  Administered 2015-01-12: 0.4 mg via INTRAVENOUS

## 2015-01-12 MED ORDER — ARTIFICIAL TEARS OP OINT
TOPICAL_OINTMENT | OPHTHALMIC | Status: DC | PRN
  Administered 2015-01-12: 1 via OPHTHALMIC

## 2015-01-12 MED ORDER — EPHEDRINE SULFATE 50 MG/ML IJ SOLN
INTRAMUSCULAR | Status: DC | PRN
  Administered 2015-01-12: 5 mg via INTRAVENOUS
  Administered 2015-01-12: 10 mg via INTRAVENOUS

## 2015-01-12 MED ORDER — METHYLENE BLUE 1 % INJ SOLN
INTRAMUSCULAR | Status: DC | PRN
  Administered 2015-01-12: .3 mL

## 2015-01-12 MED ORDER — METHYLENE BLUE 1 % INJ SOLN
INTRAMUSCULAR | Status: AC
  Filled 2015-01-12: qty 10

## 2015-01-12 MED ORDER — FENTANYL CITRATE (PF) 100 MCG/2ML IJ SOLN
INTRAMUSCULAR | Status: DC | PRN
  Administered 2015-01-12: 50 ug via INTRAVENOUS
  Administered 2015-01-12 (×2): 100 ug via INTRAVENOUS
  Administered 2015-01-12 (×5): 50 ug via INTRAVENOUS

## 2015-01-12 MED ORDER — PHENYLEPHRINE 40 MCG/ML (10ML) SYRINGE FOR IV PUSH (FOR BLOOD PRESSURE SUPPORT)
PREFILLED_SYRINGE | INTRAVENOUS | Status: AC
  Filled 2015-01-12: qty 10

## 2015-01-12 MED ORDER — ONDANSETRON HCL 4 MG/2ML IJ SOLN
INTRAMUSCULAR | Status: DC | PRN
  Administered 2015-01-12 (×2): 4 mg via INTRAVENOUS

## 2015-01-12 MED ORDER — ONDANSETRON HCL 4 MG/2ML IJ SOLN
INTRAMUSCULAR | Status: AC
  Administered 2015-01-12: 4 mg
  Filled 2015-01-12: qty 2

## 2015-01-12 MED ORDER — ALBUMIN HUMAN 5 % IV SOLN
INTRAVENOUS | Status: DC | PRN
Start: 2015-01-12 — End: 2015-01-12
  Administered 2015-01-12: 16:00:00 via INTRAVENOUS

## 2015-01-12 MED ORDER — BUPIVACAINE-EPINEPHRINE 0.25% -1:200000 IJ SOLN
INTRAMUSCULAR | Status: DC | PRN
  Administered 2015-01-12: 4 mL

## 2015-01-12 MED ORDER — CEFAZOLIN SODIUM-DEXTROSE 2-3 GM-% IV SOLR
INTRAVENOUS | Status: AC
  Filled 2015-01-12: qty 50

## 2015-01-12 MED ORDER — BUPIVACAINE-EPINEPHRINE (PF) 0.25% -1:200000 IJ SOLN
INTRAMUSCULAR | Status: AC
  Filled 2015-01-12: qty 30

## 2015-01-12 MED ORDER — PHENYLEPHRINE HCL 10 MG/ML IJ SOLN
INTRAMUSCULAR | Status: DC | PRN
  Administered 2015-01-12 (×2): 80 ug via INTRAVENOUS
  Administered 2015-01-12: 40 ug via INTRAVENOUS

## 2015-01-12 MED ORDER — DEXAMETHASONE SODIUM PHOSPHATE 10 MG/ML IJ SOLN
INTRAMUSCULAR | Status: DC | PRN
  Administered 2015-01-12: 10 mg via INTRAVENOUS

## 2015-01-12 MED ORDER — HEMOSTATIC AGENTS (NO CHARGE) OPTIME
TOPICAL | Status: DC | PRN
  Administered 2015-01-12: 1 via TOPICAL

## 2015-01-12 MED ORDER — PROMETHAZINE HCL 25 MG/ML IJ SOLN: 6.2500 mg | INTRAMUSCULAR | Status: DC | PRN

## 2015-01-12 MED ORDER — LIDOCAINE HCL (CARDIAC) 20 MG/ML IV SOLN
INTRAVENOUS | Status: DC | PRN
  Administered 2015-01-12: 40 mg via INTRAVENOUS

## 2015-01-12 MED ORDER — ACETAMINOPHEN 10 MG/ML IV SOLN
INTRAVENOUS | Status: AC
  Administered 2015-01-12: 1000 mg
  Filled 2015-01-12: qty 100

## 2015-01-12 MED ORDER — DIAZEPAM 5 MG PO TABS
ORAL_TABLET | ORAL | Status: AC
  Filled 2015-01-12: qty 1

## 2015-01-12 MED ORDER — SODIUM CHLORIDE 0.9 % IV SOLN
INTRAVENOUS | Status: DC | PRN
  Administered 2015-01-12: 17:00:00 via INTRAVENOUS

## 2015-01-12 MED ORDER — NEOSTIGMINE METHYLSULFATE 10 MG/10ML IV SOLN
INTRAVENOUS | Status: DC | PRN
  Administered 2015-01-12: 3 mg via INTRAVENOUS

## 2015-01-12 MED ORDER — MIDAZOLAM HCL 5 MG/5ML IJ SOLN
INTRAMUSCULAR | Status: DC | PRN
  Administered 2015-01-12: 2 mg via INTRAVENOUS

## 2015-01-12 SURGICAL SUPPLY — 74 items
BENZOIN TINCTURE PRP APPL 2/3 (GAUZE/BANDAGES/DRESSINGS) ×3 IMPLANT
BUR ROUND PRECISION 4.0 (BURR) ×2 IMPLANT
BUR ROUND PRECISION 4.0MM (BURR) ×1
CANISTER SUCTION 2500CC (MISCELLANEOUS) ×3 IMPLANT
CARTRIDGE OIL MAESTRO DRILL (MISCELLANEOUS) ×1 IMPLANT
CLOSURE WOUND 1/2 X4 (GAUZE/BANDAGES/DRESSINGS) ×1
CORDS BIPOLAR (ELECTRODE) ×3 IMPLANT
COVER SURGICAL LIGHT HANDLE (MISCELLANEOUS) ×3 IMPLANT
DIFFUSER DRILL AIR PNEUMATIC (MISCELLANEOUS) ×3 IMPLANT
DRAIN CHANNEL 15F RND FF W/TCR (WOUND CARE) IMPLANT
DRAPE POUCH INSTRU U-SHP 10X18 (DRAPES) ×3 IMPLANT
DRAPE SURG 17X23 STRL (DRAPES) ×12 IMPLANT
DURAPREP 26ML APPLICATOR (WOUND CARE) ×3 IMPLANT
ELECT BLADE 4.0 EZ CLEAN MEGAD (MISCELLANEOUS) ×3
ELECT BLADE 6.5 EXT (BLADE) IMPLANT
ELECT CAUTERY BLADE 6.4 (BLADE) ×3 IMPLANT
ELECT REM PT RETURN 9FT ADLT (ELECTROSURGICAL) ×3
ELECTRODE BLDE 4.0 EZ CLN MEGD (MISCELLANEOUS) ×1 IMPLANT
ELECTRODE REM PT RTRN 9FT ADLT (ELECTROSURGICAL) ×1 IMPLANT
EVACUATOR SILICONE 100CC (DRAIN) IMPLANT
FILTER STRAW FLUID ASPIR (MISCELLANEOUS) IMPLANT
GAUZE SPONGE 4X4 12PLY STRL (GAUZE/BANDAGES/DRESSINGS) ×3 IMPLANT
GAUZE SPONGE 4X4 16PLY XRAY LF (GAUZE/BANDAGES/DRESSINGS) IMPLANT
GLOVE BIO SURGEON STRL SZ7 (GLOVE) ×6 IMPLANT
GLOVE BIO SURGEON STRL SZ8 (GLOVE) ×3 IMPLANT
GLOVE BIOGEL PI IND STRL 7.0 (GLOVE) ×1 IMPLANT
GLOVE BIOGEL PI IND STRL 8 (GLOVE) ×1 IMPLANT
GLOVE BIOGEL PI INDICATOR 7.0 (GLOVE) ×2
GLOVE BIOGEL PI INDICATOR 8 (GLOVE) ×2
GOWN STRL REUS W/ TWL LRG LVL3 (GOWN DISPOSABLE) ×2 IMPLANT
GOWN STRL REUS W/ TWL XL LVL3 (GOWN DISPOSABLE) ×1 IMPLANT
GOWN STRL REUS W/TWL LRG LVL3 (GOWN DISPOSABLE) ×4
GOWN STRL REUS W/TWL XL LVL3 (GOWN DISPOSABLE) ×3
IV CATH 14GX2 1/4 (CATHETERS) ×3 IMPLANT
KIT BASIN OR (CUSTOM PROCEDURE TRAY) ×3 IMPLANT
KIT ROOM TURNOVER OR (KITS) ×3 IMPLANT
NEEDLE 18GX1X1/2 (RX/OR ONLY) (NEEDLE) ×3 IMPLANT
NEEDLE 22X1 1/2 (OR ONLY) (NEEDLE) ×3 IMPLANT
NEEDLE HYPO 18GX1.5 BLUNT FILL (NEEDLE) ×3 IMPLANT
NEEDLE HYPO 25GX1X1/2 BEV (NEEDLE) ×3 IMPLANT
NEEDLE SPNL 18GX3.5 QUINCKE PK (NEEDLE) ×6 IMPLANT
NS IRRIG 1000ML POUR BTL (IV SOLUTION) ×3 IMPLANT
OIL CARTRIDGE MAESTRO DRILL (MISCELLANEOUS) ×3
PACK LAMINECTOMY ORTHO (CUSTOM PROCEDURE TRAY) ×3 IMPLANT
PACK UNIVERSAL I (CUSTOM PROCEDURE TRAY) ×3 IMPLANT
PAD ARMBOARD 7.5X6 YLW CONV (MISCELLANEOUS) ×9 IMPLANT
PATTIES SURGICAL .5 X.5 (GAUZE/BANDAGES/DRESSINGS) IMPLANT
PATTIES SURGICAL .5 X1 (DISPOSABLE) ×3 IMPLANT
SPONGE INTESTINAL PEANUT (DISPOSABLE) ×3 IMPLANT
SPONGE SURGIFOAM ABS GEL 100 (HEMOSTASIS) ×3 IMPLANT
STRIP CLOSURE SKIN 1/2X4 (GAUZE/BANDAGES/DRESSINGS) ×2 IMPLANT
SURGIFLO TRUKIT (HEMOSTASIS) ×3 IMPLANT
SUT BONE WAX W31G (SUTURE) ×6 IMPLANT
SUT ETHILON 2 0 FS 18 (SUTURE) IMPLANT
SUT MNCRL AB 3-0 PS2 18 (SUTURE) ×3 IMPLANT
SUT VIC AB 0 CT1 18XCR BRD 8 (SUTURE) ×1 IMPLANT
SUT VIC AB 0 CT1 27 (SUTURE)
SUT VIC AB 0 CT1 27XBRD ANBCTR (SUTURE) IMPLANT
SUT VIC AB 0 CT1 8-18 (SUTURE) ×2
SUT VIC AB 1 CT1 18XCR BRD 8 (SUTURE) ×1 IMPLANT
SUT VIC AB 1 CT1 8-18 (SUTURE) ×2
SUT VIC AB 2-0 CT2 18 VCP726D (SUTURE) ×3 IMPLANT
SYR 20CC LL (SYRINGE) IMPLANT
SYR 3ML LL SCALE MARK (SYRINGE) ×3 IMPLANT
SYR 50ML SLIP (SYRINGE) IMPLANT
SYR BULB IRRIGATION 50ML (SYRINGE) ×3 IMPLANT
SYR CONTROL 10ML LL (SYRINGE) ×3 IMPLANT
SYR TB 1ML 26GX3/8 SAFETY (SYRINGE) IMPLANT
SYR TB 1ML LUER SLIP (SYRINGE) ×9 IMPLANT
TAPE CLOTH SURG 4X10 WHT LF (GAUZE/BANDAGES/DRESSINGS) ×3 IMPLANT
TOWEL OR 17X24 6PK STRL BLUE (TOWEL DISPOSABLE) ×3 IMPLANT
TOWEL OR 17X26 10 PK STRL BLUE (TOWEL DISPOSABLE) ×3 IMPLANT
WATER STERILE IRR 1000ML POUR (IV SOLUTION) IMPLANT
YANKAUER SUCT BULB TIP NO VENT (SUCTIONS) ×3 IMPLANT

## 2015-01-12 NOTE — OR Nursing (Addendum)
Brad Turner described his pain as tolerable as I progressed him to Phase II in recovery.  He stated he still had numbness in his Left leg but he had numbness in that leg prior to surgery. He asked me how much alcohol could be consumed when he got home because he needed some "liquid refreshment" and about "5-6 of them".   I asked "alcohol?"  He grinned and said "well,  I will need a drink."  I stated he will have on his discharge instructions to not drink alcohol.  I also informed him of the effects alcohol when taking narcotics and benzodiazepines like both of his prescriptions.  He was informed of the increased risk of falls with alcohol use and drugs.  He shrugged his shoulders and grinned and said "we need to come to some kind of agreement" and I stated "No alcohol while taking your prescriptions".  When his friend Brad Turner arrived and we were all reviewing his discharge instructions together he made a reference again to drinking alcohol and she stated "You can't drink when you get home."  He reluctantly agreed to our stance on no alcohol. I finished explaining discharge instructions and answering all of their questions.  I gave them a copy of the instructions and prescriptions for filling.  Brad Turner stated he already had the prescriptions filled yesterday and had the meds at home.  Pt was wheeled out to the car in a wheelchair and assisted to the car with minimal assistance.

## 2015-01-12 NOTE — Anesthesia Preprocedure Evaluation (Signed)
Anesthesia Evaluation  Patient identified by MRN, date of birth, ID band Patient awake    Reviewed: Allergy & Precautions, NPO status , Patient's Chart, lab work & pertinent test results  Airway Mallampati: II  TM Distance: >3 FB Neck ROM: Full    Dental  (+) Teeth Intact, Dental Advisory Given   Pulmonary Current Smoker,  breath sounds clear to auscultation        Cardiovascular hypertension, Rhythm:Regular Rate:Normal     Neuro/Psych    GI/Hepatic   Endo/Other    Renal/GU      Musculoskeletal   Abdominal   Peds  Hematology   Anesthesia Other Findings   Reproductive/Obstetrics                             Anesthesia Physical Anesthesia Plan  ASA: III  Anesthesia Plan: General   Post-op Pain Management:    Induction: Intravenous  Airway Management Planned: Oral ETT  Additional Equipment:   Intra-op Plan:   Post-operative Plan:   Informed Consent: I have reviewed the patients History and Physical, chart, labs and discussed the procedure including the risks, benefits and alternatives for the proposed anesthesia with the patient or authorized representative who has indicated his/her understanding and acceptance.   Dental advisory given  Plan Discussed with: CRNA and Anesthesiologist  Anesthesia Plan Comments: (Lumbar spondylosis Smoker/COPD Hypertension CAD S/P PTCA no angina H/O syncope felt to be inducted    Plan GA with oral ETT  Roberts Gaudy )        Anesthesia Quick Evaluation

## 2015-01-12 NOTE — Anesthesia Postprocedure Evaluation (Signed)
  Anesthesia Post-op Note  Patient: Brad Turner  Procedure(s) Performed: Procedure(s) (LRB): LUMBAR LAMINECTOMY/DECOMPRESSION MICRODISCECTOMY 3 LEVELS (N/A)  Patient Location: PACU  Anesthesia Type: General  Level of Consciousness: awake and alert   Airway and Oxygen Therapy: Patient Spontanous Breathing  Post-op Pain: mild  Post-op Assessment: Post-op Vital signs reviewed, Patient's Cardiovascular Status Stable, Respiratory Function Stable, Patent Airway and No signs of Nausea or vomiting  Last Vitals:  Filed Vitals:   01/12/15 1730  BP: 152/83  Pulse: 103  Temp: 36.8 C  Resp: 17    Post-op Vital Signs: stable   Complications: No apparent anesthesia complications

## 2015-01-12 NOTE — Transfer of Care (Signed)
Immediate Anesthesia Transfer of Care Note  Patient: Brad Turner  Procedure(s) Performed: Procedure(s) with comments: LUMBAR LAMINECTOMY/DECOMPRESSION MICRODISCECTOMY 3 LEVELS (N/A) - Lumbar 3-4, lumbar 4-5, lumbar 5-sacrum 1 decompression  Patient Location: PACU  Anesthesia Type:General  Level of Consciousness: awake, alert , oriented and patient cooperative  Airway & Oxygen Therapy: Patient Spontanous Breathing and Patient connected to nasal cannula oxygen  Post-op Assessment: Report given to RN and Post -op Vital signs reviewed and stable  Post vital signs: Reviewed and stable  Last Vitals:  Filed Vitals:   01/12/15 0957  BP: 158/82  Pulse: 87  Temp: 36.7 C  Resp: 20    Complications: No apparent anesthesia complications

## 2015-01-12 NOTE — Anesthesia Procedure Notes (Signed)
Procedure Name: Intubation Date/Time: 01/12/2015 12:50 PM Performed by: Jacquiline Doe A Pre-anesthesia Checklist: Patient identified, Timeout performed, Emergency Drugs available, Suction available and Patient being monitored Patient Re-evaluated:Patient Re-evaluated prior to inductionOxygen Delivery Method: Circle system utilized Preoxygenation: Pre-oxygenation with 100% oxygen Intubation Type: IV induction and Cricoid Pressure applied Ventilation: Mask ventilation without difficulty and Oral airway inserted - appropriate to patient size Laryngoscope Size: Mac and 4 Grade View: Grade II Tube type: Oral Tube size: 8.0 mm Number of attempts: 2 Airway Equipment and Method: Bougie stylet and Stylet Placement Confirmation: ETT inserted through vocal cords under direct vision,  breath sounds checked- equal and bilateral and positive ETCO2 Secured at: 22 cm Tube secured with: Tape Dental Injury: Teeth and Oropharynx as per pre-operative assessment

## 2015-01-13 MED FILL — Heparin Sodium (Porcine) Inj 1000 Unit/ML: INTRAMUSCULAR | Qty: 30 | Status: AC

## 2015-01-13 MED FILL — Thrombin For Soln 20000 Unit: CUTANEOUS | Qty: 1 | Status: AC

## 2015-01-13 MED FILL — Sodium Chloride IV Soln 0.9%: INTRAVENOUS | Qty: 3000 | Status: AC

## 2015-01-13 NOTE — Op Note (Signed)
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  PROCEDURE:  Laminectomy with partial facetectomy and bilateral foraminotomy L3-4, L4-5, L5-S1.  SURGEON:  Phylliss Bob, MD.  ASSISTANTSPricilla Holm, PA-C.  ANESTHESIA:  General endotracheal anesthesia.  COMPLICATIONS:  None.  DISPOSITION:  Stable.  ESTIMATED BLOOD LOSS:  Minimal.  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  The patient's MRI was clearly notable for a stenosis from L3-S1.  The patient failed multiple forms of appropriate conservative care and did continue to have pain.  We therefore did discuss proceeding with the procedure reflected above.  The patient did fully understand the risks and limitations of the procedure.  Of particular note, the patient was aware that the goal of surgery was to address his bilateral leg pain, and not to address his low back pain. The patient did elect to proceed.  OPERATIVE DETAILS:  On Jan 12, 2015, the patient was brought to surgery and general endotracheal anesthesia was administered.  The patient was placed prone on a well-padded flat Jackson bed with a Wilson frame. Antibiotics were given and a time-out procedure was performed.  The back was prepped and draped and a midline incision was made overlying the L3- S1 levels.  The fascia was incised in the midline and the paraspinal musculature was bluntly retracted laterally.  I then removed the  spinous processes of L3, L4, and L5.  I then proceeded with a central decompression, which was followed by a bilateral lateral recess decompression.  There was substantial facet hypertrophy on the right and on the left each of the levels decompressed.  On the right, there was substantial neuroforaminal stenosis at L4-5 and at L5-S1, and there was substantial neuroforaminal stenosis on the left at L5-S1.  The foraminal stenosis was addressed by continuing the partial facetectomies out and lateral to the pedicles.  I was very pleased with the final decompression.  The wound was copiously irrigated with approximately 2 L of normal saline.  Epidural bleeding was controlled using Surgiflo.  The wound was then closed in layers using #1 Vicryl, followed by 2-0 Vicryl, followed by 3-0 Monocryl.  Benzoin and Steri-Strips were applied followed by sterile dressing.  All instrument counts were correct at the termination of the procedure.  Of note, Pricilla Holm, was my assistant throughout surgery and did aid in retraction, suctioning, and closure throughout the surgery.     Phylliss Bob, MD     MD/MEDQ  D:  01/12/2015  T:  01/13/2015  Job:  [id number]  cc:   Haywood Pao, M.D.

## 2015-01-16 ENCOUNTER — Encounter (HOSPITAL_COMMUNITY): Payer: Self-pay | Admitting: Orthopedic Surgery

## 2015-01-16 ENCOUNTER — Encounter (HOSPITAL_COMMUNITY): Payer: Self-pay

## 2015-01-17 ENCOUNTER — Encounter (HOSPITAL_COMMUNITY): Payer: Self-pay

## 2015-01-19 ENCOUNTER — Encounter (HOSPITAL_COMMUNITY): Payer: Self-pay

## 2015-01-23 ENCOUNTER — Encounter (HOSPITAL_COMMUNITY): Payer: Self-pay

## 2015-01-24 ENCOUNTER — Encounter (HOSPITAL_COMMUNITY): Payer: Self-pay

## 2015-01-26 ENCOUNTER — Encounter (HOSPITAL_COMMUNITY): Payer: Self-pay

## 2015-01-31 ENCOUNTER — Encounter (HOSPITAL_COMMUNITY): Payer: Self-pay

## 2015-02-02 ENCOUNTER — Encounter (HOSPITAL_COMMUNITY): Payer: Medicare Other

## 2015-02-02 DIAGNOSIS — E785 Hyperlipidemia, unspecified: Secondary | ICD-10-CM | POA: Insufficient documentation

## 2015-02-02 DIAGNOSIS — I2510 Atherosclerotic heart disease of native coronary artery without angina pectoris: Secondary | ICD-10-CM | POA: Insufficient documentation

## 2015-02-02 DIAGNOSIS — Z5189 Encounter for other specified aftercare: Secondary | ICD-10-CM | POA: Insufficient documentation

## 2015-02-06 ENCOUNTER — Encounter (HOSPITAL_COMMUNITY): Payer: Self-pay

## 2015-02-07 ENCOUNTER — Encounter (HOSPITAL_COMMUNITY): Payer: Self-pay

## 2015-02-09 ENCOUNTER — Encounter (HOSPITAL_COMMUNITY): Payer: Self-pay

## 2015-02-13 ENCOUNTER — Encounter (HOSPITAL_COMMUNITY): Payer: Self-pay

## 2015-02-14 ENCOUNTER — Encounter (HOSPITAL_COMMUNITY)
Admission: RE | Admit: 2015-02-14 | Discharge: 2015-02-14 | Disposition: A | Discharge status: Home / Self Care | Payer: Self-pay | Source: Ambulatory Visit | Attending: Cardiology | Admitting: Cardiology

## 2015-02-16 ENCOUNTER — Encounter (HOSPITAL_COMMUNITY)
Admission: RE | Admit: 2015-02-16 | Discharge: 2015-02-16 | Disposition: A | Discharge status: Home / Self Care | Payer: Self-pay | Source: Ambulatory Visit | Attending: Cardiology | Admitting: Cardiology

## 2015-02-20 ENCOUNTER — Encounter (HOSPITAL_COMMUNITY)
Admission: RE | Admit: 2015-02-20 | Discharge: 2015-02-20 | Disposition: A | Discharge status: Home / Self Care | Payer: Self-pay | Source: Ambulatory Visit | Attending: Cardiology | Admitting: Cardiology

## 2015-02-21 ENCOUNTER — Encounter (HOSPITAL_COMMUNITY)
Admission: RE | Admit: 2015-02-21 | Discharge: 2015-02-21 | Disposition: A | Discharge status: Home / Self Care | Payer: Self-pay | Source: Ambulatory Visit | Attending: Cardiology | Admitting: Cardiology

## 2015-02-21 ENCOUNTER — Other Ambulatory Visit: Payer: Self-pay | Admitting: Gastroenterology

## 2015-02-23 ENCOUNTER — Encounter (HOSPITAL_COMMUNITY): Payer: Self-pay

## 2015-02-27 ENCOUNTER — Encounter (HOSPITAL_COMMUNITY)
Admission: RE | Admit: 2015-02-27 | Discharge: 2015-02-27 | Disposition: A | Discharge status: Home / Self Care | Payer: Self-pay | Source: Ambulatory Visit | Attending: Cardiology | Admitting: Cardiology

## 2015-02-28 ENCOUNTER — Encounter: Payer: Self-pay | Admitting: Gastroenterology

## 2015-02-28 ENCOUNTER — Encounter (HOSPITAL_COMMUNITY)
Admission: RE | Admit: 2015-02-28 | Discharge: 2015-02-28 | Disposition: A | Discharge status: Home / Self Care | Payer: Self-pay | Source: Ambulatory Visit | Attending: Cardiology | Admitting: Cardiology

## 2015-03-02 ENCOUNTER — Encounter (HOSPITAL_COMMUNITY)
Admission: RE | Admit: 2015-03-02 | Discharge: 2015-03-02 | Disposition: A | Discharge status: Home / Self Care | Payer: Self-pay | Source: Ambulatory Visit | Attending: Cardiology | Admitting: Cardiology

## 2015-03-03 ENCOUNTER — Telehealth: Payer: Self-pay | Admitting: Pulmonary Disease

## 2015-03-03 NOTE — Telephone Encounter (Signed)
Pt asking if he had ever had a Prevnar 13 vaccine? Pt informed we have not given him the vaccine before. Nothing further needed.

## 2015-03-07 ENCOUNTER — Encounter (HOSPITAL_COMMUNITY): Payer: Medicare Other

## 2015-03-07 DIAGNOSIS — E785 Hyperlipidemia, unspecified: Secondary | ICD-10-CM | POA: Insufficient documentation

## 2015-03-07 DIAGNOSIS — I2510 Atherosclerotic heart disease of native coronary artery without angina pectoris: Secondary | ICD-10-CM | POA: Insufficient documentation

## 2015-03-07 DIAGNOSIS — Z5189 Encounter for other specified aftercare: Secondary | ICD-10-CM | POA: Insufficient documentation

## 2015-03-09 ENCOUNTER — Encounter (HOSPITAL_COMMUNITY)
Admission: RE | Admit: 2015-03-09 | Discharge: 2015-03-09 | Disposition: A | Discharge status: Home / Self Care | Payer: Self-pay | Source: Ambulatory Visit | Attending: Cardiology | Admitting: Cardiology

## 2015-03-13 ENCOUNTER — Encounter (HOSPITAL_COMMUNITY)
Admission: RE | Admit: 2015-03-13 | Discharge: 2015-03-13 | Disposition: A | Discharge status: Home / Self Care | Payer: Self-pay | Source: Ambulatory Visit | Attending: Cardiology | Admitting: Cardiology

## 2015-03-14 ENCOUNTER — Encounter (HOSPITAL_COMMUNITY)
Admission: RE | Admit: 2015-03-14 | Discharge: 2015-03-14 | Disposition: A | Discharge status: Home / Self Care | Payer: Self-pay | Source: Ambulatory Visit | Attending: Cardiology | Admitting: Cardiology

## 2015-03-16 ENCOUNTER — Encounter (HOSPITAL_COMMUNITY)
Admission: RE | Admit: 2015-03-16 | Discharge: 2015-03-16 | Disposition: A | Discharge status: Home / Self Care | Payer: Self-pay | Source: Ambulatory Visit | Attending: Cardiology | Admitting: Cardiology

## 2015-03-20 ENCOUNTER — Encounter (HOSPITAL_COMMUNITY)
Admission: RE | Admit: 2015-03-20 | Discharge: 2015-03-20 | Disposition: A | Discharge status: Home / Self Care | Payer: Self-pay | Source: Ambulatory Visit | Attending: Cardiology | Admitting: Cardiology

## 2015-03-21 ENCOUNTER — Encounter (HOSPITAL_COMMUNITY)
Admission: RE | Admit: 2015-03-21 | Discharge: 2015-03-21 | Disposition: A | Discharge status: Home / Self Care | Payer: Medicare Other | Source: Ambulatory Visit | Attending: Cardiology | Admitting: Cardiology

## 2015-03-23 ENCOUNTER — Encounter (HOSPITAL_COMMUNITY)
Admission: RE | Admit: 2015-03-23 | Discharge: 2015-03-23 | Disposition: A | Discharge status: Home / Self Care | Payer: Self-pay | Source: Ambulatory Visit | Attending: Cardiology | Admitting: Cardiology

## 2015-03-27 ENCOUNTER — Encounter (HOSPITAL_COMMUNITY)
Admission: RE | Admit: 2015-03-27 | Discharge: 2015-03-27 | Disposition: A | Discharge status: Home / Self Care | Payer: Self-pay | Source: Ambulatory Visit | Attending: Cardiology | Admitting: Cardiology

## 2015-03-28 ENCOUNTER — Encounter (HOSPITAL_COMMUNITY)
Admission: RE | Admit: 2015-03-28 | Discharge: 2015-03-28 | Disposition: A | Discharge status: Home / Self Care | Payer: Self-pay | Source: Ambulatory Visit | Attending: Cardiology | Admitting: Cardiology

## 2015-03-30 ENCOUNTER — Encounter (HOSPITAL_COMMUNITY)
Admission: RE | Admit: 2015-03-30 | Discharge: 2015-03-30 | Disposition: A | Discharge status: Home / Self Care | Payer: Self-pay | Source: Ambulatory Visit | Attending: Cardiology | Admitting: Cardiology

## 2015-04-03 ENCOUNTER — Encounter (HOSPITAL_COMMUNITY)
Admission: RE | Admit: 2015-04-03 | Discharge: 2015-04-03 | Disposition: A | Discharge status: Home / Self Care | Payer: Self-pay | Source: Ambulatory Visit | Attending: Cardiology | Admitting: Cardiology

## 2015-04-03 DIAGNOSIS — E785 Hyperlipidemia, unspecified: Secondary | ICD-10-CM | POA: Insufficient documentation

## 2015-04-03 DIAGNOSIS — Z5189 Encounter for other specified aftercare: Secondary | ICD-10-CM | POA: Insufficient documentation

## 2015-04-03 DIAGNOSIS — I2510 Atherosclerotic heart disease of native coronary artery without angina pectoris: Secondary | ICD-10-CM | POA: Insufficient documentation

## 2015-04-04 ENCOUNTER — Encounter (HOSPITAL_COMMUNITY)
Admission: RE | Admit: 2015-04-04 | Discharge: 2015-04-04 | Disposition: A | Discharge status: Home / Self Care | Payer: Self-pay | Source: Ambulatory Visit | Attending: Cardiology | Admitting: Cardiology

## 2015-04-06 ENCOUNTER — Encounter (HOSPITAL_COMMUNITY)
Admission: RE | Admit: 2015-04-06 | Discharge: 2015-04-06 | Disposition: A | Discharge status: Home / Self Care | Payer: Self-pay | Source: Ambulatory Visit | Attending: Cardiology | Admitting: Cardiology

## 2015-04-10 ENCOUNTER — Encounter (HOSPITAL_COMMUNITY): Payer: Self-pay

## 2015-04-11 ENCOUNTER — Encounter (HOSPITAL_COMMUNITY): Payer: Self-pay

## 2015-04-13 ENCOUNTER — Encounter (HOSPITAL_COMMUNITY): Payer: Self-pay

## 2015-04-17 ENCOUNTER — Encounter (HOSPITAL_COMMUNITY): Payer: Self-pay

## 2015-04-18 ENCOUNTER — Encounter (HOSPITAL_COMMUNITY)
Admission: RE | Admit: 2015-04-18 | Discharge: 2015-04-18 | Disposition: A | Discharge status: Home / Self Care | Payer: Self-pay | Source: Ambulatory Visit | Attending: Cardiology | Admitting: Cardiology

## 2015-04-20 ENCOUNTER — Encounter (HOSPITAL_COMMUNITY)
Admission: RE | Admit: 2015-04-20 | Discharge: 2015-04-20 | Disposition: A | Discharge status: Home / Self Care | Payer: Self-pay | Source: Ambulatory Visit | Attending: Cardiology | Admitting: Cardiology

## 2015-04-24 ENCOUNTER — Encounter (HOSPITAL_COMMUNITY)
Admission: RE | Admit: 2015-04-24 | Discharge: 2015-04-24 | Disposition: A | Discharge status: Home / Self Care | Payer: Self-pay | Source: Ambulatory Visit | Attending: Cardiology | Admitting: Cardiology

## 2015-04-25 ENCOUNTER — Encounter (HOSPITAL_COMMUNITY): Payer: Self-pay

## 2015-04-27 ENCOUNTER — Encounter (HOSPITAL_COMMUNITY)
Admission: RE | Admit: 2015-04-27 | Discharge: 2015-04-27 | Disposition: A | Discharge status: Home / Self Care | Payer: Self-pay | Source: Ambulatory Visit | Attending: Cardiology | Admitting: Cardiology

## 2015-05-01 ENCOUNTER — Encounter (HOSPITAL_COMMUNITY)
Admission: RE | Admit: 2015-05-01 | Discharge: 2015-05-01 | Disposition: A | Discharge status: Home / Self Care | Payer: Self-pay | Source: Ambulatory Visit | Attending: Cardiology | Admitting: Cardiology

## 2015-05-02 ENCOUNTER — Encounter (HOSPITAL_COMMUNITY): Payer: Self-pay

## 2015-05-04 ENCOUNTER — Encounter (HOSPITAL_COMMUNITY): Payer: Medicare (Managed Care)

## 2015-05-04 DIAGNOSIS — E785 Hyperlipidemia, unspecified: Secondary | ICD-10-CM | POA: Insufficient documentation

## 2015-05-04 DIAGNOSIS — Z5189 Encounter for other specified aftercare: Secondary | ICD-10-CM | POA: Insufficient documentation

## 2015-05-04 DIAGNOSIS — I2510 Atherosclerotic heart disease of native coronary artery without angina pectoris: Secondary | ICD-10-CM | POA: Insufficient documentation

## 2015-05-09 ENCOUNTER — Encounter (HOSPITAL_COMMUNITY)
Admission: RE | Admit: 2015-05-09 | Discharge: 2015-05-09 | Disposition: A | Discharge status: Home / Self Care | Payer: Self-pay | Source: Ambulatory Visit | Attending: Cardiology | Admitting: Cardiology

## 2015-05-11 ENCOUNTER — Encounter (HOSPITAL_COMMUNITY)
Admission: RE | Admit: 2015-05-11 | Discharge: 2015-05-11 | Disposition: A | Discharge status: Home / Self Care | Payer: Self-pay | Source: Ambulatory Visit | Attending: Cardiology | Admitting: Cardiology

## 2015-05-15 ENCOUNTER — Encounter (HOSPITAL_COMMUNITY)
Admission: RE | Admit: 2015-05-15 | Discharge: 2015-05-15 | Disposition: A | Discharge status: Home / Self Care | Payer: Self-pay | Source: Ambulatory Visit | Attending: Cardiology | Admitting: Cardiology

## 2015-05-16 ENCOUNTER — Encounter (HOSPITAL_COMMUNITY)
Admission: RE | Admit: 2015-05-16 | Discharge: 2015-05-16 | Disposition: A | Discharge status: Home / Self Care | Payer: Self-pay | Source: Ambulatory Visit | Attending: Cardiology | Admitting: Cardiology

## 2015-05-17 ENCOUNTER — Telehealth: Payer: Self-pay | Admitting: *Deleted

## 2015-05-17 ENCOUNTER — Encounter: Payer: Self-pay | Admitting: Gastroenterology

## 2015-05-17 ENCOUNTER — Ambulatory Visit (INDEPENDENT_AMBULATORY_CARE_PROVIDER_SITE_OTHER): Payer: Medicare Other | Admitting: Gastroenterology

## 2015-05-17 VITALS — BP 128/80 | HR 84 | Ht 64.25 in | Wt 163.1 lb

## 2015-05-17 DIAGNOSIS — K2270 Barrett's esophagus without dysplasia: Secondary | ICD-10-CM

## 2015-05-17 DIAGNOSIS — R1314 Dysphagia, pharyngoesophageal phase: Secondary | ICD-10-CM | POA: Diagnosis not present

## 2015-05-17 NOTE — Assessment & Plan Note (Addendum)
Recurrent dysphagia solids.  Rule out peptic stricture.    Recommendations #1 EGD with dilation  The risk of holding anticoagulation therapy or antiplatelet medications was discussed including the increased risk for thromboembolic disease that may include DVT, pulmonary emboli and stroke. The patient understands this risk and is willing to proceed with temporally holding the medication provided that this is approved by her PCP or cardiologist.

## 2015-05-17 NOTE — Patient Instructions (Addendum)
You have been given a separate informational sheet regarding your tobacco use, the importance of quitting and local resources to help you quit.   You have been scheduled for an endoscopy. Please follow written instructions given to you at your visit today. If you use inhalers (even only as needed), please bring them with you on the [place] of your procedure. Your physician has requested that you go to www.startemmi.com and enter the access code given to you at your visit today. This web site gives a general overview about your procedure. However, you should still follow specific instructions given to you by our office regarding your preparation for the procedure.  You will be contacted by our office prior to your procedure for directions on holding your Plavix.  If you do not hear from our office 1 week prior to your scheduled procedure, please call [phone] to discuss.

## 2015-05-17 NOTE — Progress Notes (Signed)
      History of Present Illness:  Brad Turner complaining of dysphagia to solids.  He no longer has pyrosis.  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  Weight is stable.    Review of Systems: Pertinent positive and negative review of systems were noted in the above HPI section. All other review of systems were otherwise negative.    Current Medications, Allergies, Past Medical History, Past Surgical History, Family History and Social History were reviewed in [name] record  Vital signs were reviewed in today's medical record. Physical Exam: General: Well developed , well nourished, no acute distress Skin: anicteric Head: Normocephalic and atraumatic Eyes:  sclerae anicteric, EOMI Ears: Normal auditory acuity Mouth: No deformity or lesions Lymph Nodes: no lymphadenopathy Lungs: Clear throughout to auscultation Heart: Regular rate and rhythm; no murmurs, rubs or brui: Gastroinestinal:  Soft, non tender and non distended. No masses, hepatosplenomegaly or hernias noted. Normal Bowel sounds Rectal:deferred Musculoskeletal: Symmetrical with no gross deformities  Pulses:  Normal pulses noted Extremities: No clubbing, cyanosis, edema or deformities noted Neurological: Alert oriented x 4, grossly nonfocal Psychological:  Alert and cooperative. Normal mood and affect  See Assessment and Plan under Problem List

## 2015-05-17 NOTE — Telephone Encounter (Signed)
  05/17/2015   RE: Brad Turner DOB: [birth year] MRN: [id number]   Dear  Dr Osborne Casco,   We have scheduled the above patient for an endoscopic procedure. Our records show that he is on anticoagulation therapy.   Please advise as to how long the patient may come off his therapy of Plavix prior to the procedure, which is scheduled for 06/27/2015.  Please fax back/ or route the completed form to [facility] at [phone].   Sincerely,    Genella Mech

## 2015-05-17 NOTE — Assessment & Plan Note (Signed)
2014 biopsies negative for dysplasia.  Plan repeat biopsies at the time of his dilation therapy

## 2015-05-18 ENCOUNTER — Encounter (HOSPITAL_COMMUNITY)
Admission: RE | Admit: 2015-05-18 | Discharge: 2015-05-18 | Disposition: A | Discharge status: Home / Self Care | Payer: Self-pay | Source: Ambulatory Visit | Attending: Cardiology | Admitting: Cardiology

## 2015-05-22 ENCOUNTER — Encounter (HOSPITAL_COMMUNITY)
Admission: RE | Admit: 2015-05-22 | Discharge: 2015-05-22 | Disposition: A | Discharge status: Home / Self Care | Payer: Self-pay | Source: Ambulatory Visit | Attending: Cardiology | Admitting: Cardiology

## 2015-05-23 ENCOUNTER — Encounter (HOSPITAL_COMMUNITY)
Admission: RE | Admit: 2015-05-23 | Discharge: 2015-05-23 | Disposition: A | Discharge status: Home / Self Care | Payer: Self-pay | Source: Ambulatory Visit | Attending: Cardiology | Admitting: Cardiology

## 2015-05-25 ENCOUNTER — Encounter (HOSPITAL_COMMUNITY)
Admission: RE | Admit: 2015-05-25 | Discharge: 2015-05-25 | Disposition: A | Discharge status: Home / Self Care | Payer: Self-pay | Source: Ambulatory Visit | Attending: Cardiology | Admitting: Cardiology

## 2015-05-26 NOTE — Telephone Encounter (Signed)
NO RESPONSE ON PLAVIX LETTER, FAXED AGAIN TO [place]

## 2015-05-29 ENCOUNTER — Encounter (HOSPITAL_COMMUNITY)
Admission: RE | Admit: 2015-05-29 | Discharge: 2015-05-29 | Disposition: A | Discharge status: Home / Self Care | Payer: Self-pay | Source: Ambulatory Visit | Attending: Cardiology | Admitting: Cardiology

## 2015-05-30 ENCOUNTER — Encounter (HOSPITAL_COMMUNITY): Payer: Self-pay

## 2015-05-30 NOTE — Telephone Encounter (Signed)
No Answer on Plavix- called to speak with Dr Sonda Primes nurse today    [place]  L/M for her to return my call and gave her neuro's number

## 2015-06-01 ENCOUNTER — Encounter (HOSPITAL_COMMUNITY)
Admission: RE | Admit: 2015-06-01 | Discharge: 2015-06-01 | Disposition: A | Discharge status: Home / Self Care | Payer: Self-pay | Source: Ambulatory Visit | Attending: Cardiology | Admitting: Cardiology

## 2015-06-05 ENCOUNTER — Encounter (HOSPITAL_COMMUNITY): Payer: Self-pay

## 2015-06-05 ENCOUNTER — Ambulatory Visit: Payer: Medicare Other | Admitting: Pulmonary Disease

## 2015-06-05 DIAGNOSIS — Z5189 Encounter for other specified aftercare: Secondary | ICD-10-CM | POA: Insufficient documentation

## 2015-06-05 DIAGNOSIS — E785 Hyperlipidemia, unspecified: Secondary | ICD-10-CM | POA: Insufficient documentation

## 2015-06-05 DIAGNOSIS — I2510 Atherosclerotic heart disease of native coronary artery without angina pectoris: Secondary | ICD-10-CM | POA: Insufficient documentation

## 2015-06-06 ENCOUNTER — Encounter (HOSPITAL_COMMUNITY)
Admission: RE | Admit: 2015-06-06 | Discharge: 2015-06-06 | Disposition: A | Discharge status: Home / Self Care | Payer: Self-pay | Source: Ambulatory Visit | Attending: Cardiology | Admitting: Cardiology

## 2015-06-06 NOTE — Telephone Encounter (Signed)
Contacted patient to inform plavix can be held 7 days

## 2015-06-07 ENCOUNTER — Ambulatory Visit (INDEPENDENT_AMBULATORY_CARE_PROVIDER_SITE_OTHER): Payer: Medicare Other | Admitting: Pulmonary Disease

## 2015-06-07 ENCOUNTER — Encounter: Payer: Self-pay | Admitting: Pulmonary Disease

## 2015-06-07 VITALS — BP 116/70 | HR 100 | Temp 98.2°F | Wt 160.0 lb

## 2015-06-07 DIAGNOSIS — Z720 Tobacco use: Secondary | ICD-10-CM

## 2015-06-07 DIAGNOSIS — J439 Emphysema, unspecified: Secondary | ICD-10-CM

## 2015-06-07 DIAGNOSIS — F17211 Nicotine dependence, cigarettes, in remission: Secondary | ICD-10-CM | POA: Diagnosis not present

## 2015-06-07 DIAGNOSIS — F17210 Nicotine dependence, cigarettes, uncomplicated: Secondary | ICD-10-CM

## 2015-06-07 NOTE — Patient Instructions (Signed)
Brad Turner-- it was nice speaking w/ you today...  We decided to continue the SYMBICORT160- 2 sprays twice daily...  We also decided to proceed w/ a low-dose screening CT scan of your chest due to your risk from smoking...    We will contact you w/ the results when available...   I also rec that you use the OTC MUCINEX 600mg  tabs- one tab up to 4 times daily w/ fluids...    This will help to loosen the thick phlegm 7 make it easier to expectorate...  Call for any questions...  Let's plan a follow up visit in 26mo, sooner if needed for problems.Marland KitchenMarland Kitchen

## 2015-06-07 NOTE — Progress Notes (Signed)
Subjective:    Patient ID: Brad Turner, male    DOB: July 29, 1941, 74 y.o.   MRN: [id number]  HPI  ~  October 07, 2014:  1wk ROV w/ KC>      Pt saw DrMR  09/29/14 w/ COPD exac treated w/ Doxy & Pred...      Patient comes in today for worsening cough and classic description of cough syncope. His cough is high-pitched and barking in nature, and only produces white mucus. He does not feel congested, nor has he had increasing shortness of breath when not coughing. He has had episodes of syncope that are related to coughing episodes and paroxysms. Unfortunately, he continues to smoke, and is also on an ACE inhibitor.      PLAN>  The patient is describing classic cough syncope, and clearly his cough is upper airway in origin. It is a high-pitched barking cough and he has no purulence or congestion. His lungs are also totally clear to auscultation today. The patient has hoarseness and is describing a classic globus sensation. At this point, I think his cough is upper airway in origin and primarily related to cyclical coughing and his ACE inhibitor. Will discontinue this medication, and also work on cough suppression. I have also reviewed behavioral therapies to help with his globus sensation and urge to cough. Finally, I have stressed to him the importance of total smoking cessation.  ~  December 12, 2014:  106mo ROV w/ KC>        The patient comes in today for follow-up of his known COPD. He is continuing to smoke, and continues to have chronic dyspnea on exertion and excessive mucus production. However, he feels that his symptoms are within his usual baseline. He has finished up his drug study, and currently is not on an anti-cholinergic.      PLAN>  The patient feels that his breathing is near his usual baseline, and unfortunately continues to smoke. He is now off his prior drug study, and I would like to add an anti-cholinergic to his Symbicort to try and improve his dyspnea on exertion. I have also talked  with him again about total smoking cessation, and help this is the key to getting better.   CT Chest 09/17/13>  Clearing of prior RLL opac, 3 small nodules on the left- unchanged from 2011, severe emphysematous change, pos coronary calcif  Spirometry 11/22/13 at [facility]> FVC=3.26 (89%), FEV1=1.84 (69%), %1sec=56;  This is c/w a moderate obstructive ventilatory defect  V/Q Lung Scan 10/29/14>  Patchy nonsegmental ventilation defects, patchy scattered nonsegmental perfusion defects, felt to be a low prob scan...  CXR 10/29/14>  Heart is upper lim of norm, hyperinflated/ emphysematous lungs- NAD, DJD spine...  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     Cigarette smoker>  Still smoking 1.5ppd and he has smoked 1-1.5ppd for ~60 yrs; he declines smoking cessation help & is not motivated to quit...    Hx cough syncope> aware    CARDIAC issues> HBP, CAD, HL- followed by DrTilley (no notes in Epic)- on Plavix75, Amlod5-1/2, Diovan80, Lip80, [phone]; he remains in cardiac rehab regularly...    Dysphagia,  hx esoph stricture & Barrett's esoph> on Dexilant60; eval by DrKaplan w/ hx dilatation in the past    GU issues> on Myrbetriq50, Cialis5    Anxiety/depression> on WellbutrinXL150; his son is treated for depression, friend died w/ metastatic lung cancer EXAM shows Afeb, VSS, O2sat=94% on RA;  HEENT- dental issues, mallampati2;  Chest- decr BS bilat w/o w/r/r;  Heart- RR w/o m/r/g;  Abd- soft, nontender, neg;  Ext- w/o c/c/e;  Neuro- intact...  Low dose screening CT Chest10/14/16>  Norm heart size, coronary & aortic calcif, 23mm noncalcif nodule in LUL, scat calcif granulomas up to 2.26mm; mod  centrilobular & paraseptal emphysema, abd is ok w/ 2cm cyst in liver...  IMP/PLAN>>  Legrand Como & I discussed his continued smoking & he is not motivated to quit- offered meds and he will call if he changes his mind;  Rec to continue Symbicort160-2spBid, he was tried on Spiriva respimat in past but wouldn't fill it due to cost- I pointed out that if he'd quit smoking he could get the Spiriva & noted that his breathing would be better due to both things! Rec to add Mucinex 2400mg /d.Marland KitchenMarland KitchenWe plan ROV 25months.    Past Medical History  Diagnosis Date  . CVA (cerebral infarction)   . Hyperlipidemia   . GERD (gastroesophageal reflux disease)   . CAD (coronary artery disease)   . Arthritis   . Hypertension   . Cataracts, bilateral   . 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 Stroke [facility])     Affected balance; and has very emotional if watching a movie  . COPD (chronic obstructive pulmonary disease) ([place])   . Emphysema lung ([place])   . Pneumonia     hx of  . Depression   . BPH (benign prostatic hyperplasia)   . Urgency of urination     and frequency of urination at bedtime  . History of kidney stones   . Skin cancer   . Barrett esophagus     Past Surgical History  Procedure Laterality Date  . Appendectomy    . Angioplasty      x4  . Tonsillectomy    . Rotator cuff repair      bilateral  . Perithyroid    . Cataracts Bilateral   . Cystoscopy  07/21/2013    Dr. Jeffie Pollock  . Cardiac catheterization    . Lumbar laminectomy/decompression microdiscectomy N/A 01/12/2015    Procedure: LUMBAR LAMINECTOMY/DECOMPRESSION MICRODISCECTOMY 3 LEVELS;  Surgeon: Phylliss Bob, MD;  Location: [facility];  Service: Orthopedics;  Laterality: N/A;  Lumbar 3-4, lumbar 4-5, lumbar 5-sacrum 1 decompression    Outpatient Encounter Prescriptions as of 06/07/2015  Medication Sig  . acetaminophen (TYLENOL) 325 MG tablet Take 325 mg by mouth every 6 (six) hours as needed for mild pain or moderate pain.  Marland Kitchen albuterol  (PROVENTIL HFA;VENTOLIN HFA) 108 (90 BASE) MCG/ACT inhaler Inhale 2 puffs into the lungs every 6 (six) hours as needed for wheezing or shortness of breath.   Marland Kitchen amLODipine (NORVASC) 5 MG tablet Take 2.5 mg by mouth daily.  Marland Kitchen atorvastatin (LIPITOR) 80 MG tablet Take 80 mg by mouth every morning.   . clopidogrel (PLAVIX) 75 MG tablet Take 1 tablet by mouth daily.  Marland Kitchen DEXILANT 60 MG capsule TAKE ONE CAPSULE BY MOUTH ONCE DAILY  . fenofibrate (TRICOR) 145 MG tablet Take 145 mg by mouth every morning.   . folic acid (FOLVITE) 295 MCG tablet Take 800 mcg by mouth daily.  Marland Kitchen  hydroxypropyl methylcellulose / hypromellose (ISOPTO TEARS / GONIOVISC) 2.5 % ophthalmic solution Place 1 drop into both eyes as needed for dry eyes.  Marland Kitchen MYRBETRIQ 50 MG TB24 tablet Take 1 tablet by mouth daily.  Marland Kitchen NITROSTAT 0.4 MG SL tablet Place 0.4 mg under the tongue every 5 (five) minutes as needed for chest pain.   . SYMBICORT 160-4.5 MCG/ACT inhaler INHALE TWO PUFFS INTO LUNGS TWICE DAILY  . tadalafil (CIALIS) 5 MG tablet Take 5 mg by mouth daily. For BPH  . valsartan (DIOVAN) 80 MG tablet Take 1 tablet (80 mg total) by mouth daily.  Marland Kitchen buPROPion (WELLBUTRIN XL) 150 MG 24 hr tablet Take 1 tablet by mouth as directed.   No facility-administered encounter medications on file as of 06/07/2015.    Allergies  Allergen Reactions  . Ivp Dye [Iodinated Diagnostic Agents] Hives and Shortness Of Breath  . Macrodantin [Nitrofurantoin] Other (See Comments)    Fever that lasted several months    Immunization History  Administered Date(s) Administered  . Influenza Split 05/14/2012  . Influenza Whole 06/02/2010, 05/09/2011  . Influenza,inj,Quad PF,36+ Mos 06/02/2013, 05/24/2014  . Influenza-Unspecified 05/31/2015  . Pneumococcal Polysaccharide-23 06/02/2009  . Tdap 10/29/2014    Current Medications, Allergies, Past Medical History, Past Surgical History, Family History, and Social History were reviewed in [employer] record.   Review of Systems  Constitutional: Negative for fever and unexpected weight change.  HENT: Positive for congestion and postnasal drip. Negative for dental problem, ear pain, nosebleeds, rhinorrhea, sinus pressure, sneezing, sore throat and trouble swallowing.   Eyes: Negative for redness and itching.  Respiratory: Positive for cough, shortness of breath and wheezing. Negative for chest tightness.   Cardiovascular: Positive for leg swelling. Negative for palpitations.  Gastrointestinal: Negative for nausea and vomiting.  Genitourinary: Negative for dysuria.  Musculoskeletal: Negative for joint swelling.  Skin: Negative for rash.  Neurological: Negative for headaches.  Hematological: Does not bruise/bleed easily.  Psychiatric/Behavioral: Negative for dysphoric mood. The patient is not nervous/anxious.       Objective:   Physical Exam  Well-developed male in no acute distress Nose without purulence or discharge noted Neck without lymphadenopathy or thyromegaly Chest with decreased breath sounds, no wheezes or rhonchi Cardiac exam with regular rate and rhythm Lower extremities without edema, no cyanosis Alert and oriented, moves all 4 extremities.     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     Cigarette smoker>  Still smoking 1.5ppd and he has smoked 1-1.5ppd for ~60 yrs; he declines smoking cessation help & is not motivated to quit...    Hx cough syncope> aware    CARDIAC issues> HBP, CAD, HL- followed by DrTilley (no notes in Epic)- on Plavix75, Amlod5-1/2, Diovan80, Lip80, [phone]; he remains in cardiac rehab regularly...    Dysphagia, hx esoph stricture & Barrett's esoph> on Dexilant60; eval by DrKaplan w/ hx dilatation in the past    GU issues> on Myrbetriq50, Cialis5     Anxiety/depression> on WellbutrinXL150; his son is treated for depression, friend died w/ metastatic lung cancer  PLAN >>      Nikoli & I discussed his continued smoking & he is not motivated to quit- offered meds and he will call if he changes his mind;  Rec to continue Symbicort160-2spBid, he was  tried on Spiriva respimat in past but wouldn't fill it due to cost- I pointed out that if he'd quit smoking he could get the Spiriva & noted that his breathing would be better due to both things! Rec to add Mucinex 2400mg /d.Marland KitchenMarland KitchenWe plan ROV 6months.   Patient's Medications  New Prescriptions   No medications on file  Previous Medications   ACETAMINOPHEN (TYLENOL) 325 MG TABLET    Take 325 mg by mouth every 6 (six) hours as needed for mild pain or moderate pain.   ALBUTEROL (PROVENTIL HFA;VENTOLIN HFA) 108 (90 BASE) MCG/ACT INHALER    Inhale 2 puffs into the lungs every 6 (six) hours as needed for wheezing or shortness of breath.    AMLODIPINE (NORVASC) 5 MG TABLET    Take 2.5 mg by mouth daily.   ATORVASTATIN (LIPITOR) 80 MG TABLET    Take 80 mg by mouth every morning.    BUPROPION (WELLBUTRIN XL) 150 MG 24 HR TABLET    Take 1 tablet by mouth as directed.   CLOPIDOGREL (PLAVIX) 75 MG TABLET    Take 1 tablet by mouth daily.   DEXILANT 60 MG CAPSULE    TAKE ONE CAPSULE BY MOUTH ONCE DAILY   FENOFIBRATE (TRICOR) 145 MG TABLET    Take 145 mg by mouth every morning.    FOLIC ACID (FOLVITE) 163 MCG TABLET    Take 800 mcg by mouth daily.   HYDROXYPROPYL METHYLCELLULOSE / HYPROMELLOSE (ISOPTO TEARS / GONIOVISC) 2.5 % OPHTHALMIC SOLUTION    Place 1 drop into both eyes as needed for dry eyes.   MYRBETRIQ 50 MG TB24 TABLET    Take 1 tablet by mouth daily.   NITROSTAT 0.4 MG SL TABLET    Place 0.4 mg under the tongue every 5 (five) minutes as needed for chest pain.    SYMBICORT 160-4.5 MCG/ACT INHALER    INHALE TWO PUFFS INTO LUNGS TWICE DAILY   TADALAFIL (CIALIS) 5 MG TABLET    Take 5 mg by mouth daily. For BPH     VALSARTAN (DIOVAN) 80 MG TABLET    Take 1 tablet (80 mg total) by mouth daily.  Modified Medications   No medications on file  Discontinued Medications   No medications on file

## 2015-06-08 ENCOUNTER — Encounter (HOSPITAL_COMMUNITY)
Admission: RE | Admit: 2015-06-08 | Discharge: 2015-06-08 | Disposition: A | Discharge status: Home / Self Care | Payer: Self-pay | Source: Ambulatory Visit | Attending: Cardiology | Admitting: Cardiology

## 2015-06-12 ENCOUNTER — Encounter (HOSPITAL_COMMUNITY)
Admission: RE | Admit: 2015-06-12 | Discharge: 2015-06-12 | Disposition: A | Discharge status: Home / Self Care | Payer: Self-pay | Source: Ambulatory Visit | Attending: Cardiology | Admitting: Cardiology

## 2015-06-13 ENCOUNTER — Encounter (HOSPITAL_COMMUNITY)
Admission: RE | Admit: 2015-06-13 | Discharge: 2015-06-13 | Disposition: A | Discharge status: Home / Self Care | Payer: Self-pay | Source: Ambulatory Visit | Attending: Cardiology | Admitting: Cardiology

## 2015-06-13 ENCOUNTER — Ambulatory Visit: Payer: Medicare Other | Admitting: Pulmonary Disease

## 2015-06-15 ENCOUNTER — Encounter (HOSPITAL_COMMUNITY)
Admission: RE | Admit: 2015-06-15 | Discharge: 2015-06-15 | Disposition: A | Discharge status: Home / Self Care | Payer: Self-pay | Source: Ambulatory Visit | Attending: Cardiology | Admitting: Cardiology

## 2015-06-16 ENCOUNTER — Ambulatory Visit (INDEPENDENT_AMBULATORY_CARE_PROVIDER_SITE_OTHER)
Admission: RE | Admit: 2015-06-16 | Discharge: 2015-06-16 | Disposition: A | Discharge status: Home / Self Care | Payer: Medicare Other | Source: Ambulatory Visit | Attending: Pulmonary Disease | Admitting: Pulmonary Disease

## 2015-06-16 DIAGNOSIS — F17211 Nicotine dependence, cigarettes, in remission: Secondary | ICD-10-CM

## 2015-06-16 DIAGNOSIS — J439 Emphysema, unspecified: Secondary | ICD-10-CM | POA: Diagnosis not present

## 2015-06-19 ENCOUNTER — Encounter (HOSPITAL_COMMUNITY)
Admission: RE | Admit: 2015-06-19 | Discharge: 2015-06-19 | Disposition: A | Discharge status: Home / Self Care | Payer: Self-pay | Source: Ambulatory Visit | Attending: Cardiology | Admitting: Cardiology

## 2015-06-20 ENCOUNTER — Encounter (HOSPITAL_COMMUNITY): Payer: Self-pay

## 2015-06-22 ENCOUNTER — Encounter (HOSPITAL_COMMUNITY)
Admission: RE | Admit: 2015-06-22 | Discharge: 2015-06-22 | Disposition: A | Discharge status: Home / Self Care | Payer: Self-pay | Source: Ambulatory Visit | Attending: Cardiology | Admitting: Cardiology

## 2015-06-26 ENCOUNTER — Encounter (HOSPITAL_COMMUNITY): Payer: Self-pay

## 2015-06-27 ENCOUNTER — Encounter: Payer: Medicare (Managed Care) | Admitting: Gastroenterology

## 2015-06-27 ENCOUNTER — Encounter (HOSPITAL_COMMUNITY)
Admission: RE | Admit: 2015-06-27 | Discharge: 2015-06-27 | Disposition: A | Discharge status: Home / Self Care | Payer: Self-pay | Source: Ambulatory Visit | Attending: Cardiology | Admitting: Cardiology

## 2015-06-29 ENCOUNTER — Encounter (HOSPITAL_COMMUNITY): Payer: Self-pay

## 2015-07-03 ENCOUNTER — Encounter (HOSPITAL_COMMUNITY)
Admission: RE | Admit: 2015-07-03 | Discharge: 2015-07-03 | Disposition: A | Discharge status: Home / Self Care | Payer: Self-pay | Source: Ambulatory Visit | Attending: Cardiology | Admitting: Cardiology

## 2015-07-04 ENCOUNTER — Encounter (HOSPITAL_COMMUNITY)
Admission: RE | Admit: 2015-07-04 | Discharge: 2015-07-04 | Disposition: A | Discharge status: Home / Self Care | Payer: Self-pay | Source: Ambulatory Visit | Attending: Cardiology | Admitting: Cardiology

## 2015-07-04 DIAGNOSIS — Z955 Presence of coronary angioplasty implant and graft: Secondary | ICD-10-CM | POA: Insufficient documentation

## 2015-07-06 ENCOUNTER — Encounter (HOSPITAL_COMMUNITY)
Admission: RE | Admit: 2015-07-06 | Discharge: 2015-07-06 | Disposition: A | Discharge status: Home / Self Care | Payer: Self-pay | Source: Ambulatory Visit | Attending: Cardiology | Admitting: Cardiology

## 2015-07-10 ENCOUNTER — Encounter (HOSPITAL_COMMUNITY): Payer: Self-pay

## 2015-07-11 ENCOUNTER — Encounter (HOSPITAL_COMMUNITY)
Admission: RE | Admit: 2015-07-11 | Discharge: 2015-07-11 | Disposition: A | Discharge status: Home / Self Care | Payer: Self-pay | Source: Ambulatory Visit | Attending: Cardiology | Admitting: Cardiology

## 2015-07-13 ENCOUNTER — Encounter (HOSPITAL_COMMUNITY)
Admission: RE | Admit: 2015-07-13 | Discharge: 2015-07-13 | Disposition: A | Discharge status: Home / Self Care | Payer: Self-pay | Source: Ambulatory Visit | Attending: Cardiology | Admitting: Cardiology

## 2015-07-17 ENCOUNTER — Encounter (HOSPITAL_COMMUNITY)
Admission: RE | Admit: 2015-07-17 | Discharge: 2015-07-17 | Disposition: A | Discharge status: Home / Self Care | Payer: Self-pay | Source: Ambulatory Visit | Attending: Cardiology | Admitting: Cardiology

## 2015-07-18 ENCOUNTER — Encounter (HOSPITAL_COMMUNITY)
Admission: RE | Admit: 2015-07-18 | Discharge: 2015-07-18 | Disposition: A | Discharge status: Home / Self Care | Payer: Self-pay | Source: Ambulatory Visit | Attending: Cardiology | Admitting: Cardiology

## 2015-07-20 ENCOUNTER — Encounter (HOSPITAL_COMMUNITY): Payer: Self-pay

## 2015-07-24 ENCOUNTER — Encounter (HOSPITAL_COMMUNITY)
Admission: RE | Admit: 2015-07-24 | Discharge: 2015-07-24 | Disposition: A | Discharge status: Home / Self Care | Payer: Self-pay | Source: Ambulatory Visit | Attending: Cardiology | Admitting: Cardiology

## 2015-07-25 ENCOUNTER — Encounter (HOSPITAL_COMMUNITY): Payer: Self-pay

## 2015-07-31 ENCOUNTER — Encounter (HOSPITAL_COMMUNITY): Payer: Self-pay

## 2015-08-01 ENCOUNTER — Encounter (HOSPITAL_COMMUNITY)
Admission: RE | Admit: 2015-08-01 | Discharge: 2015-08-01 | Disposition: A | Discharge status: Home / Self Care | Payer: Self-pay | Source: Ambulatory Visit | Attending: Cardiology | Admitting: Cardiology

## 2015-08-03 ENCOUNTER — Encounter (HOSPITAL_COMMUNITY)
Admission: RE | Admit: 2015-08-03 | Discharge: 2015-08-03 | Disposition: A | Discharge status: Home / Self Care | Payer: Self-pay | Source: Ambulatory Visit | Attending: Cardiology | Admitting: Cardiology

## 2015-08-03 DIAGNOSIS — Z9861 Coronary angioplasty status: Secondary | ICD-10-CM | POA: Insufficient documentation

## 2015-08-07 ENCOUNTER — Encounter (HOSPITAL_COMMUNITY)
Admission: RE | Admit: 2015-08-07 | Discharge: 2015-08-07 | Disposition: A | Discharge status: Home / Self Care | Payer: Self-pay | Source: Ambulatory Visit | Attending: Cardiology | Admitting: Cardiology

## 2015-08-08 ENCOUNTER — Encounter (HOSPITAL_COMMUNITY)
Admission: RE | Admit: 2015-08-08 | Discharge: 2015-08-08 | Disposition: A | Discharge status: Home / Self Care | Payer: Self-pay | Source: Ambulatory Visit | Attending: Cardiology | Admitting: Cardiology

## 2015-08-10 ENCOUNTER — Encounter (HOSPITAL_COMMUNITY)
Admission: RE | Admit: 2015-08-10 | Discharge: 2015-08-10 | Disposition: A | Discharge status: Home / Self Care | Payer: Self-pay | Source: Ambulatory Visit | Attending: Cardiology | Admitting: Cardiology

## 2015-08-14 ENCOUNTER — Encounter (HOSPITAL_COMMUNITY)
Admission: RE | Admit: 2015-08-14 | Discharge: 2015-08-14 | Disposition: A | Discharge status: Home / Self Care | Payer: Self-pay | Source: Ambulatory Visit | Attending: Cardiology | Admitting: Cardiology

## 2015-08-15 ENCOUNTER — Encounter (HOSPITAL_COMMUNITY): Payer: Self-pay

## 2015-08-16 ENCOUNTER — Encounter: Payer: Medicare (Managed Care) | Admitting: Gastroenterology

## 2015-08-17 ENCOUNTER — Encounter (HOSPITAL_COMMUNITY)
Admission: RE | Admit: 2015-08-17 | Discharge: 2015-08-17 | Disposition: A | Discharge status: Home / Self Care | Payer: Self-pay | Source: Ambulatory Visit | Attending: Cardiology | Admitting: Cardiology

## 2015-08-21 ENCOUNTER — Encounter (HOSPITAL_COMMUNITY)
Admission: RE | Admit: 2015-08-21 | Discharge: 2015-08-21 | Disposition: A | Discharge status: Home / Self Care | Payer: Self-pay | Source: Ambulatory Visit | Attending: Cardiology | Admitting: Cardiology

## 2015-08-22 ENCOUNTER — Encounter (HOSPITAL_COMMUNITY)
Admission: RE | Admit: 2015-08-22 | Discharge: 2015-08-22 | Disposition: A | Discharge status: Home / Self Care | Payer: Self-pay | Source: Ambulatory Visit | Attending: Cardiology | Admitting: Cardiology

## 2015-08-24 ENCOUNTER — Ambulatory Visit (INDEPENDENT_AMBULATORY_CARE_PROVIDER_SITE_OTHER): Payer: Medicare Other | Admitting: Gastroenterology

## 2015-08-24 ENCOUNTER — Encounter: Payer: Self-pay | Admitting: Gastroenterology

## 2015-08-24 ENCOUNTER — Encounter (HOSPITAL_COMMUNITY): Payer: Self-pay

## 2015-08-24 VITALS — BP 136/80 | HR 72 | Ht 64.25 in | Wt 156.0 lb

## 2015-08-24 DIAGNOSIS — K2270 Barrett's esophagus without dysplasia: Secondary | ICD-10-CM

## 2015-08-24 DIAGNOSIS — R1310 Dysphagia, unspecified: Secondary | ICD-10-CM | POA: Diagnosis not present

## 2015-08-24 DIAGNOSIS — Z79899 Other long term (current) drug therapy: Secondary | ICD-10-CM | POA: Diagnosis not present

## 2015-08-24 DIAGNOSIS — K219 Gastro-esophageal reflux disease without esophagitis: Secondary | ICD-10-CM | POA: Diagnosis not present

## 2015-08-24 NOTE — Patient Instructions (Signed)
You have been scheduled for an endoscopy. Please follow written instructions given to you at your visit today. If you use inhalers (even only as needed), please bring them with you on the [place] of your procedure. Your physician has requested that you go to www.startemmi.com and enter the access code given to you at your visit today. This web site gives a general overview about your procedure. However, you should still follow specific instructions given to you by our office regarding your preparation for the procedure.   We have sent the following medications to your pharmacy for you to pick up at your convenience:  Omeprazole  

## 2015-08-24 NOTE — Progress Notes (Signed)
HPI :  74 y/o male, former patient of Dr. Deatra Ina, new to me, here for follow up for symptoms of dysphagia and a history of GERD / short segment nondysplastic Barrett's. He otherwise has a history of COPD and CAD.    He is here for follow up. He continues to have some dysphagia. He reports it is intermittent and does not routinely bother him with each meal. However if he doesn't chew his food well he will have some symptoms. He reports it is not daily. He usually has dysphagia when eating steak or other bulky foods. He feels it get caught up in the sternal notch and eventually passes. He reports this has been bothering him for 5-6 years or so intermittently. He reports he had significant benefit from the last dilation done in 2009, but symptoms slowly recurring. He endorses a history of GERD, but is not bothering him at this time while he takes [place]. He has been on other PPIs in the past, which he thinks has worked okay for him. He is concerned about the price of the Dexilant at this time. He thinks he has been on prilosec and prevacid. He thinks prilosec worked okay historically, took periodic zantac for breakthrough. No odynophagia. No weight loss. EGD history as outlined below.  He is also taking plavix for a history of CVA and history of coronary angioplasty / CAD. He has been on plavix for "years". He is taking a aspirin 81mg  otherwise. He has come off plavix in the past without problems. He has some exertional shortness of breath which he thinks is due to COPD, he is not on oxygen. He was hospitalized last year due to syncope thought to be related to excessive coughing. He reports stable cardiopulmonary symptoms.   He reports he has had significant dental work in the past which he reports is now giving his trouble again.   EGD 5/14 - irregular z-line, biopsies c/w nondysplastic BE Colonoscopy 11/11 - one small hyperplastic polyp, diverticulosis EGD 10/10 - irregular z-line, no Barrett's on  path EGD 9/9 - GEJ stricture, dilated with 23mm Maloney, (+) BE on path    Past Medical History  Diagnosis Date  . CVA (cerebral infarction)   . Hyperlipidemia   . GERD (gastroesophageal reflux disease)   . CAD (coronary artery disease)   . Arthritis   . Hypertension   . Cataracts, bilateral   . 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 Stroke [facility])     Affected balance; and has very emotional if watching a movie  . COPD (chronic obstructive pulmonary disease) ([place])   . Emphysema lung ([place])   . Pneumonia     hx of  . Depression   . BPH (benign prostatic hyperplasia)   . Urgency of urination     and frequency of urination at bedtime  . History of kidney stones   . Skin cancer   . Barrett esophagus      Past Surgical History  Procedure Laterality Date  . Appendectomy    . Angioplasty      x4  . Tonsillectomy    . Rotator cuff repair      bilateral  . Perithyroid    . Cataracts Bilateral   . Cystoscopy  07/21/2013    Dr. Jeffie Pollock  . Cardiac catheterization    . Lumbar laminectomy/decompression microdiscectomy N/A 01/12/2015    Procedure: LUMBAR LAMINECTOMY/DECOMPRESSION MICRODISCECTOMY 3 LEVELS;  Surgeon: Phylliss Bob, MD;  Location: [facility]  OR;  Service: Orthopedics;  Laterality: N/A;  Lumbar 3-4, lumbar 4-5, lumbar 5-sacrum 1 decompression   Family History  Problem Relation Age of Onset  . Stroke Mother   . Heart disease Father   . Rheum arthritis Mother   . Colon cancer      ? mat. uncle died age 58   Social History  Substance Use Topics  . Smoking status: Current Every [place] Smoker -- 1.50 packs/Mace for 58 years    Types: Cigarettes  . Smokeless tobacco: Never Used     Comment: Tobaccco info given 10/15/13  . Alcohol Use: 8.4 oz/week    14 Standard drinks or equivalent per week     Comment: every [place], vodka   Current Outpatient Prescriptions  Medication Sig Dispense Refill  . acetaminophen (TYLENOL) 325 MG tablet Take 325 mg by mouth every 6 (six)  hours as needed for mild pain or moderate pain.    Marland Kitchen albuterol (PROVENTIL HFA;VENTOLIN HFA) 108 (90 BASE) MCG/ACT inhaler Inhale 2 puffs into the lungs every 6 (six) hours as needed for wheezing or shortness of breath.     Marland Kitchen amLODipine (NORVASC) 5 MG tablet Take 2.5 mg by mouth daily.    Marland Kitchen atorvastatin (LIPITOR) 80 MG tablet Take 80 mg by mouth every morning.     . clopidogrel (PLAVIX) 75 MG tablet Take 1 tablet by mouth daily.    Marland Kitchen DEXILANT 60 MG capsule TAKE ONE CAPSULE BY MOUTH ONCE DAILY 30 capsule 0  . fenofibrate (TRICOR) 145 MG tablet Take 145 mg by mouth every morning.     . folic acid (FOLVITE) Q000111Q MCG tablet Take 800 mcg by mouth daily.    . hydroxypropyl methylcellulose / hypromellose (ISOPTO TEARS / GONIOVISC) 2.5 % ophthalmic solution Place 1 drop into both eyes as needed for dry eyes.    Marland Kitchen MYRBETRIQ 50 MG TB24 tablet Take 1 tablet by mouth daily.    Marland Kitchen NITROSTAT 0.4 MG SL tablet Place 0.4 mg under the tongue every 5 (five) minutes as needed for chest pain.     . SYMBICORT 160-4.5 MCG/ACT inhaler INHALE TWO PUFFS INTO LUNGS TWICE DAILY 1 Inhaler 6  . tadalafil (CIALIS) 5 MG tablet Take 5 mg by mouth daily. For BPH    . valsartan (DIOVAN) 80 MG tablet Take 1 tablet (80 mg total) by mouth daily. 30 tablet 0   No current facility-administered medications for this visit.   Allergies  Allergen Reactions  . Ivp Dye [Iodinated Diagnostic Agents] Hives and Shortness Of Breath  . Macrodantin [Nitrofurantoin] Other (See Comments)    Fever that lasted several months     Review of Systems: All systems reviewed and negative except where noted in HPI.   Lab Results  Component Value Date   WBC 6.2 01/09/2015   HGB 14.3 01/09/2015   HCT 43.2 01/09/2015   MCV 94.1 01/09/2015   PLT 341 01/09/2015    Lab Results  Component Value Date   CREATININE 1.26* 01/09/2015   BUN 16 01/09/2015   NA 139 01/09/2015   K 3.8 01/09/2015   CL 105 01/09/2015   CO2 25 01/09/2015    Lab Results    Component Value Date   ALT 20 01/09/2015   AST 39 01/09/2015   ALKPHOS 75 01/09/2015   BILITOT 0.9 01/09/2015     Physical Exam: BP 136/80 mmHg  Pulse 72  Ht 5' 4.25" (1.632 m)  Wt 156 lb (70.761 kg)  BMI 26.57 kg/m2 Constitutional: Pleasant,well-developed,  male in no acute distress. HEENT: Normocephalic and atraumatic. Conjunctivae are normal. No scleral icterus. Neck supple.  Cardiovascular: Normal rate, regular rhythm.  Pulmonary/chest: Effort normal and breath sounds normal. No wheezing, rales or rhonchi. Abdominal: Soft, nondistended, nontender. Bowel sounds active throughout. There are no masses palpable. No hepatomegaly. Extremities: no edema Lymphadenopathy: No cervical adenopathy noted. Neurological: Alert and oriented to person place and time. Skin: Skin is warm and dry. No rashes noted. Psychiatric: Normal mood and affect. Behavior is normal.   ASSESSMENT AND PLAN: 74 y/o male here for follow up for the issues as outlined below:  Dysphagia - history of GEJ stricture, with ongoing symptoms of intermittent solid food dysphagia, suspect this is driving his symptoms. I offered him an EGD to evaluate his esophagus and dilate if needed. I discussed the risks of the procedure with him. He is on plavix for history of CVA and CAD - we discussed this would need to be held for 5-7 days prior to the procedure to minimize the risk of bleeding. We also discussed that he is at higher risk for recurrence of CVA or MI while holding the plavix. We will touch base with his primary care to ensure they are okay with him holding the plavix.   GERD / Barrett's - he has a short segment of Barrett's, what appears to be an irregular z-line without dysplasia, based on pathology. I discussed that the risk of malignancy from an irregular z-line with intestinal metaplasia is extremely low, although this will be evaluated again during his endoscopy. His reflux symptoms are well controlled on Dexilant  at this time, although he is concerned that this is very expensive. He reports being on omeprazole with pretty good control of symptoms previously. We will try to switch him to 40mg  omeprazole daily, and ultimately recommend the lowest dose of PPI needed to control symptoms. If he warrants BID dosing of omeprazole he can do this. We will otherwise await his endoscopy results. He agreed.   The indications, risks, and benefits of EGD were explained to the patient in detail. Risks include but are not limited to bleeding, perforation, adverse reaction to medications, and cardiopulmonary compromise. Sequelae include but are not limited to the possibility of surgery, hospitalization, and mortality. The patient verbalized understanding and wished to proceed. All questions answered, referred to scheduler. Further recommendations pending results of the exam.   [place] Cellar, MD [facility] Pager [phone]   CC: [name], [name] Him, MD

## 2015-08-29 ENCOUNTER — Encounter (HOSPITAL_COMMUNITY)
Admission: RE | Admit: 2015-08-29 | Discharge: 2015-08-29 | Disposition: A | Discharge status: Home / Self Care | Payer: Self-pay | Source: Ambulatory Visit | Attending: Cardiology | Admitting: Cardiology

## 2015-08-30 ENCOUNTER — Other Ambulatory Visit: Payer: Self-pay

## 2015-08-30 DIAGNOSIS — K2270 Barrett's esophagus without dysplasia: Secondary | ICD-10-CM

## 2015-08-30 DIAGNOSIS — K219 Gastro-esophageal reflux disease without esophagitis: Secondary | ICD-10-CM

## 2015-08-30 DIAGNOSIS — R1310 Dysphagia, unspecified: Secondary | ICD-10-CM

## 2015-08-30 MED ORDER — NA SULFATE-K SULFATE-MG SULF 17.5-3.13-1.6 GM/177ML PO SOLN: ORAL | Status: DC

## 2015-08-31 ENCOUNTER — Encounter (HOSPITAL_COMMUNITY)
Admission: RE | Admit: 2015-08-31 | Discharge: 2015-08-31 | Disposition: A | Discharge status: Home / Self Care | Payer: Self-pay | Source: Ambulatory Visit | Attending: Cardiology | Admitting: Cardiology

## 2015-09-05 ENCOUNTER — Encounter (HOSPITAL_COMMUNITY)
Admission: RE | Admit: 2015-09-05 | Discharge: 2015-09-05 | Disposition: A | Discharge status: Home / Self Care | Payer: Self-pay | Source: Ambulatory Visit | Attending: Cardiology | Admitting: Cardiology

## 2015-09-05 ENCOUNTER — Other Ambulatory Visit: Payer: Medicare Other | Admitting: Gastroenterology

## 2015-09-05 ENCOUNTER — Telehealth: Payer: Self-pay | Admitting: Gastroenterology

## 2015-09-05 DIAGNOSIS — Z9861 Coronary angioplasty status: Secondary | ICD-10-CM | POA: Insufficient documentation

## 2015-09-05 MED ORDER — OMEPRAZOLE 40 MG PO CPDR
40.0000 mg | DELAYED_RELEASE_CAPSULE | Freq: Every day | ORAL | Status: DC
Start: 2015-09-05 — End: 2015-10-25

## 2015-09-05 NOTE — Telephone Encounter (Signed)
Patient showed up at front desk.He states his pharmacy had a colonoscopy prep kit for him but he is having an EGD. He is worried he is scheduled for the wrong procedure. He also states he is supposed to have an Omeprazole rx sent but it was not done. He is scheduled for his procedure on Friday. Verified that he is scheduled for EGD. Rx sent for Omeprazole. Patient states he stopped his Plavix on Sunday. States Dr. Havery Moros told him too.

## 2015-09-05 NOTE — Telephone Encounter (Signed)
Per Dr Osborne Casco, Dr Wynonia Lawman needs to give Plavix clearance for patient. I will send note to Dr Wynonia Lawman. I have left a voicemail for Dr Thurman Coyer nurse, Juliann Pulse regarding this as well.

## 2015-09-05 NOTE — Telephone Encounter (Signed)
Letter sent to Dr Osborne Casco regarding holding Plavix for patient's endoscopy on 09/11/15. I have also left a voicemail for Dr [employer] assistant regarding this.

## 2015-09-06 ENCOUNTER — Telehealth: Payer: Self-pay

## 2015-09-06 NOTE — Telephone Encounter (Signed)
Received anticoag letter from Dr. Wynonia Lawman. Called pt to inform him that it is ok to hold plavix. Pt understands.

## 2015-09-06 NOTE — Telephone Encounter (Signed)
Left another voicemail for Dr Thurman Coyer nurse, Juliann Pulse to call us back so we can get clearance for patient to be off plavix.

## 2015-09-07 ENCOUNTER — Encounter (HOSPITAL_COMMUNITY)
Admission: RE | Admit: 2015-09-07 | Discharge: 2015-09-07 | Disposition: A | Discharge status: Home / Self Care | Payer: Self-pay | Source: Ambulatory Visit | Attending: Cardiology | Admitting: Cardiology

## 2015-09-08 ENCOUNTER — Ambulatory Visit (AMBULATORY_SURGERY_CENTER): Payer: Medicare Other | Admitting: Gastroenterology

## 2015-09-08 ENCOUNTER — Encounter: Payer: Self-pay | Admitting: Gastroenterology

## 2015-09-08 VITALS — BP 132/100 | HR 65 | Temp 96.9°F | Resp 20 | Ht 64.25 in | Wt 156.0 lb

## 2015-09-08 DIAGNOSIS — K2970 Gastritis, unspecified, without bleeding: Secondary | ICD-10-CM | POA: Diagnosis not present

## 2015-09-08 DIAGNOSIS — K2990 Gastroduodenitis, unspecified, without bleeding: Secondary | ICD-10-CM

## 2015-09-08 DIAGNOSIS — K2270 Barrett's esophagus without dysplasia: Secondary | ICD-10-CM | POA: Diagnosis not present

## 2015-09-08 DIAGNOSIS — R1310 Dysphagia, unspecified: Secondary | ICD-10-CM | POA: Diagnosis not present

## 2015-09-08 MED ORDER — SODIUM CHLORIDE 0.9 % IV SOLN: 500.0000 mL | INTRAVENOUS | Status: DC

## 2015-09-08 NOTE — Progress Notes (Signed)
Late note- when I picked up pt in admitting to take to the procedure room, he states that I cannot take phone to carepartner in lobby.  Placed under bed per pt request

## 2015-09-08 NOTE — Progress Notes (Signed)
Procedure completed, bite block removed, no apparent damage to dentition

## 2015-09-08 NOTE — Patient Instructions (Addendum)
YOU HAD AN ENDOSCOPIC PROCEDURE TODAY AT [facility]:   Refer to the procedure report that was given to you for any specific questions about what was found during the examination.  If the procedure report does not answer your questions, please call your gastroenterologist to clarify.  If you requested that your care partner not be given the details of your procedure findings, then the procedure report has been included in a sealed envelope for you to review at your convenience later.  YOU SHOULD EXPECT: Some feelings of bloating in the abdomen. Passage of more gas than usual.  Walking can help get rid of the air that was put into your GI tract during the procedure and reduce the bloating. If you had a lower endoscopy (such as a colonoscopy or flexible sigmoidoscopy) you may notice spotting of blood in your stool or on the toilet paper. If you underwent a bowel prep for your procedure, you may not have a normal bowel movement for a few days.  Please Note:  You might notice some irritation and congestion in your nose or some drainage.  This is from the oxygen used during your procedure.  There is no need for concern and it should clear up in a [place] or so.  SYMPTOMS TO REPORT IMMEDIATELY:   Following upper endoscopy (EGD)  Vomiting of blood or coffee ground material  New chest pain or pain under the shoulder blades  Painful or persistently difficult swallowing  New shortness of breath  Fever of 100F or higher  Black, tarry-looking stools  For urgent or emergent issues, a gastroenterologist can be reached at any hour by calling [phone].   DIET: Your first meal following the procedure should be a small meal and then it is ok to progress to your normal diet. Heavy or fried foods are harder to digest and may make you feel nauseous or bloated.  Likewise, meals heavy in dairy and vegetables can increase bloating.  Drink plenty of fluids but you should avoid alcoholic beverages for  24 hours.  ACTIVITY:  You should plan to take it easy for the rest of today and you should NOT DRIVE or use heavy machinery until tomorrow (because of the sedation medicines used during the test).    FOLLOW UP: Our staff will call the number listed on your records the next business [place] following your procedure to check on you and address any questions or concerns that you may have regarding the information given to you following your procedure. If we do not reach you, we will leave a message.  However, if you are feeling well and you are not experiencing any problems, there is no need to return our call.  We will assume that you have returned to your regular daily activities without incident.  If any biopsies were taken you will be contacted by phone or by letter within the next 1-3 weeks.  Please call us at [phone] if you have not heard about the biopsies in 3 weeks.    SIGNATURES/CONFIDENTIALITY: You and/or your care partner have signed paperwork which will be entered into your electronic medical record.  These signatures attest to the fact that that the information above on your After Visit Summary has been reviewed and is understood.  Full responsibility of the confidentiality of this discharge information lies with you and/or your care-partner.  Increase omeprazole to 40 mg daily. Await biopsy results. Resume medications including Plavix tonight. Esophageal Manometry recommended. Please review GERD  handout provided.

## 2015-09-08 NOTE — Op Note (Signed)
[place]  [address] [place], [phone]   ENDOSCOPY PROCEDURE REPORT  PATIENT: Brad Turner, Brad Turner  MR#: [id number] BIRTHDATE: Oct 31, 1940 , 74  yrs. old GENDER: male ENDOSCOPIST: Yetta Flock, MD REFERRED BY: PROCEDURE DATE:  09/08/2015 PROCEDURE:  EGD w/ biopsy ASA CLASS:     Class III INDICATIONS:  dysphagia and heartburn. MEDICATIONS: Propofol 200 mg IV TOPICAL ANESTHETIC:  DESCRIPTION OF PROCEDURE: After the risks benefits and alternatives of the procedure were thoroughly explained, informed consent was obtained.  The [id number] endoscope was introduced through the mouth and advanced to the second portion of the duodenum , Without limitations.  The instrument was slowly withdrawn as the mucosa was fully examined.    FINDINGS: The esophagus had multiple (upwards of 10) sessile papules / nodules in the proximal to mid esophagus, ranging from a few mm to 6-27mm in diameter.  The largest appreciated was removed with cold forceps.  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  The esophagus was tortous and without stenosis or stricture.  The SCJ was irregular with a few extensions of salmon colored mucosa.  This has been previously biopsied and consistent with Barrett's.  Further biopsies were taken on this exam to ensure no dysplastic changes however there were no concerning features.  DH noted at 39cm from the incisors, with GEJ and SCJ being located at 37cm from the incisors, with a 2cm hiatal hernia.  The gastric body and antrum were severely erythematous, with a focal 5-66mm clean based ulcer in the gastric body.  Biopsies were taken from the edge of the gastric body ulcer to ensure benign and biopsies taken from the gastric body and antrum to rule out H pylori.  The duodenal bulb and 2nd portion of the duodenum were normal.  Retroflexed views revealed no abnormalities.     The scope was then withdrawn  from the patient and the procedure completed.  COMPLICATIONS: There were no immediate complications.    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, largest lesion removed to confirm diagnosis Irregular SCJ, biopsies obtained to ensure no dysplastic changes No focal stenosis stricture of the esophagus appreciated, no dilation performed 2cm hiatal hernia Gastric body ulcer, biopsied Severe erythematous gastropathy, biopsies taken to rule out H pylori Normal duodenum  RECOMMENDATIONS: Increase omeprazole to 40mg  twice daily Await pathology results Resume diet Resume medications, can resume Plavix tonight Recommend esophageal manometry to rule out esophageal dysmotility given lack of pathology on EGD to explain dysphagia    eSigned:  Yetta Flock, MD 09/08/2015 3:17 PM    CC: the patient  PATIENT NAME:  Brad Turner, Brad Turner MR#: [id number]

## 2015-09-08 NOTE — Progress Notes (Signed)
Dental advisory given to patient, noted the weakened left lower teeth, pt aware and accepts risk of dental injury, bite block position to right side of mouth to avoid weakened teeth on left lower

## 2015-09-08 NOTE — Progress Notes (Signed)
A/ox3 pleased with MAC, report to Wendy RN 

## 2015-09-11 ENCOUNTER — Encounter (HOSPITAL_COMMUNITY): Payer: Self-pay

## 2015-09-12 ENCOUNTER — Other Ambulatory Visit: Payer: Self-pay | Admitting: *Deleted

## 2015-09-12 ENCOUNTER — Telehealth: Payer: Self-pay | Admitting: *Deleted

## 2015-09-12 ENCOUNTER — Encounter (HOSPITAL_COMMUNITY)
Admission: RE | Admit: 2015-09-12 | Discharge: 2015-09-12 | Disposition: A | Discharge status: Home / Self Care | Payer: Self-pay | Source: Ambulatory Visit | Attending: Cardiology | Admitting: Cardiology

## 2015-09-12 ENCOUNTER — Encounter: Payer: Self-pay | Admitting: *Deleted

## 2015-09-12 NOTE — Telephone Encounter (Signed)
Per procedure note on 09/08/15, scheduled Esophageal manometry on 09/20/15 at 12:30 PM at [facility]). Instructions mailed to patient.

## 2015-09-12 NOTE — Telephone Encounter (Signed)
  Follow up Call-  Call back number 09/08/2015 01/14/2013  Post procedure Call Back phone  # [phone]  Permission to leave phone message Yes Yes     Patient questions:  Do you have a fever, pain , or abdominal swelling? No. Pain Score  0 *  Have you tolerated food without any problems? Yes.    Have you been able to return to your normal activities? Yes.    Do you have any questions about your discharge instructions: Diet   No. Medications  No. Follow up visit  No.  Do you have questions or concerns about your Care? No.  Actions: * If pain score is 4 or above: No action needed, pain <4.

## 2015-09-14 ENCOUNTER — Encounter (HOSPITAL_COMMUNITY)
Admission: RE | Admit: 2015-09-14 | Discharge: 2015-09-14 | Disposition: A | Discharge status: Home / Self Care | Payer: Self-pay | Source: Ambulatory Visit | Attending: Cardiology | Admitting: Cardiology

## 2015-09-15 ENCOUNTER — Other Ambulatory Visit: Payer: Self-pay | Admitting: Gastroenterology

## 2015-09-18 ENCOUNTER — Encounter (HOSPITAL_COMMUNITY)
Admission: RE | Admit: 2015-09-18 | Discharge: 2015-09-18 | Disposition: A | Discharge status: Home / Self Care | Payer: Self-pay | Source: Ambulatory Visit | Attending: Cardiology | Admitting: Cardiology

## 2015-09-19 ENCOUNTER — Telehealth: Payer: Self-pay | Admitting: *Deleted

## 2015-09-19 ENCOUNTER — Encounter (HOSPITAL_COMMUNITY): Payer: Self-pay

## 2015-09-19 NOTE — Telephone Encounter (Signed)
Patient is scheduled for Manometry tomorrow at [place]. He took his Omeprazole this AM. Can he have the procedure tomorrow? Per MD ok to have procedure. Patient aware.

## 2015-09-20 ENCOUNTER — Encounter (HOSPITAL_COMMUNITY)
Admission: RE | Disposition: A | Discharge status: Home / Self Care | Payer: Self-pay | Source: Ambulatory Visit | Attending: Gastroenterology

## 2015-09-20 ENCOUNTER — Ambulatory Visit (HOSPITAL_COMMUNITY)
Admission: RE | Admit: 2015-09-20 | Discharge: 2015-09-20 | Disposition: A | Discharge status: Home / Self Care | Payer: Medicare Other | Source: Ambulatory Visit | Attending: Gastroenterology | Admitting: Gastroenterology

## 2015-09-20 DIAGNOSIS — R1310 Dysphagia, unspecified: Secondary | ICD-10-CM | POA: Diagnosis not present

## 2015-09-20 HISTORY — PX: ESOPHAGEAL MANOMETRY: SHX5429

## 2015-09-20 SURGERY — MANOMETRY, ESOPHAGUS

## 2015-09-20 MED ORDER — LIDOCAINE VISCOUS 2 % MT SOLN
OROMUCOSAL | Status: AC
  Filled 2015-09-20: qty 15

## 2015-09-20 SURGICAL SUPPLY — 2 items
FACESHIELD LNG OPTICON STERILE (SAFETY) IMPLANT
GLOVE BIO SURGEON STRL SZ8 (GLOVE) ×6 IMPLANT

## 2015-09-21 ENCOUNTER — Encounter (HOSPITAL_COMMUNITY): Payer: Self-pay | Admitting: Gastroenterology

## 2015-09-21 ENCOUNTER — Encounter (HOSPITAL_COMMUNITY)
Admission: RE | Admit: 2015-09-21 | Discharge: 2015-09-21 | Disposition: A | Discharge status: Home / Self Care | Payer: Self-pay | Source: Ambulatory Visit | Attending: Cardiology | Admitting: Cardiology

## 2015-09-25 ENCOUNTER — Encounter (HOSPITAL_COMMUNITY)
Admission: RE | Admit: 2015-09-25 | Discharge: 2015-09-25 | Disposition: A | Discharge status: Home / Self Care | Payer: Self-pay | Source: Ambulatory Visit | Attending: Cardiology | Admitting: Cardiology

## 2015-09-26 ENCOUNTER — Encounter (HOSPITAL_COMMUNITY)
Admission: RE | Admit: 2015-09-26 | Discharge: 2015-09-26 | Disposition: A | Discharge status: Home / Self Care | Payer: Self-pay | Source: Ambulatory Visit | Attending: Cardiology | Admitting: Cardiology

## 2015-09-28 ENCOUNTER — Encounter (HOSPITAL_COMMUNITY)
Admission: RE | Admit: 2015-09-28 | Discharge: 2015-09-28 | Disposition: A | Discharge status: Home / Self Care | Payer: Self-pay | Source: Ambulatory Visit | Attending: Cardiology | Admitting: Cardiology

## 2015-09-28 DIAGNOSIS — R1310 Dysphagia, unspecified: Secondary | ICD-10-CM | POA: Insufficient documentation

## 2015-10-02 ENCOUNTER — Encounter (HOSPITAL_COMMUNITY)
Admission: RE | Admit: 2015-10-02 | Discharge: 2015-10-02 | Disposition: A | Discharge status: Home / Self Care | Payer: Self-pay | Source: Ambulatory Visit | Attending: Cardiology | Admitting: Cardiology

## 2015-10-02 ENCOUNTER — Telehealth: Payer: Self-pay | Admitting: Gastroenterology

## 2015-10-02 NOTE — Telephone Encounter (Signed)
Patient notified of results and recommendations. He has a recall in for EGD and will be contacted to schedule.

## 2015-10-02 NOTE — Telephone Encounter (Signed)
Rollene Fare can you please relay the results of the esophageal manometry to this patient. The exam was normal without evidence of dysmotility. I didn't see a clear source of his dysphagia on EGD or manometry. Perhaps he has a subtle stricture not appreciated on EGD but there was nothing significant noted. He did have severe gastritis and a gastric ulcer noted on EGD. I had recommended doubling his PPI and repeat an EGD a few months from his last exam to check for interval healing. Can you please let him know and help coordinate follow up EGD. Thanks.

## 2015-10-03 ENCOUNTER — Encounter (HOSPITAL_COMMUNITY)
Admission: RE | Admit: 2015-10-03 | Discharge: 2015-10-03 | Disposition: A | Discharge status: Home / Self Care | Payer: Self-pay | Source: Ambulatory Visit | Attending: Cardiology | Admitting: Cardiology

## 2015-10-05 ENCOUNTER — Encounter (HOSPITAL_COMMUNITY)
Admission: RE | Admit: 2015-10-05 | Discharge: 2015-10-05 | Disposition: A | Discharge status: Home / Self Care | Payer: Self-pay | Source: Ambulatory Visit | Attending: Cardiology | Admitting: Cardiology

## 2015-10-05 DIAGNOSIS — Z9861 Coronary angioplasty status: Secondary | ICD-10-CM | POA: Insufficient documentation

## 2015-10-09 ENCOUNTER — Encounter (HOSPITAL_COMMUNITY): Payer: Self-pay

## 2015-10-10 ENCOUNTER — Encounter (HOSPITAL_COMMUNITY)
Admission: RE | Admit: 2015-10-10 | Discharge: 2015-10-10 | Disposition: A | Discharge status: Home / Self Care | Payer: Self-pay | Source: Ambulatory Visit | Attending: Cardiology | Admitting: Cardiology

## 2015-10-12 ENCOUNTER — Encounter (HOSPITAL_COMMUNITY)
Admission: RE | Admit: 2015-10-12 | Discharge: 2015-10-12 | Disposition: A | Discharge status: Home / Self Care | Payer: Self-pay | Source: Ambulatory Visit | Attending: Cardiology | Admitting: Cardiology

## 2015-10-16 ENCOUNTER — Encounter (HOSPITAL_COMMUNITY)
Admission: RE | Admit: 2015-10-16 | Discharge: 2015-10-16 | Disposition: A | Discharge status: Home / Self Care | Payer: Self-pay | Source: Ambulatory Visit | Attending: Cardiology | Admitting: Cardiology

## 2015-10-17 ENCOUNTER — Encounter (HOSPITAL_COMMUNITY): Payer: Self-pay

## 2015-10-19 ENCOUNTER — Encounter (HOSPITAL_COMMUNITY): Payer: Self-pay

## 2015-10-23 ENCOUNTER — Encounter (HOSPITAL_COMMUNITY): Payer: Self-pay

## 2015-10-24 ENCOUNTER — Encounter (HOSPITAL_COMMUNITY)
Admission: RE | Admit: 2015-10-24 | Discharge: 2015-10-24 | Disposition: A | Discharge status: Home / Self Care | Payer: Self-pay | Source: Ambulatory Visit | Attending: Cardiology | Admitting: Cardiology

## 2015-10-25 ENCOUNTER — Other Ambulatory Visit: Payer: Self-pay

## 2015-10-25 ENCOUNTER — Telehealth: Payer: Self-pay | Admitting: Gastroenterology

## 2015-10-25 MED ORDER — OMEPRAZOLE 40 MG PO CPDR: 40.0000 mg | DELAYED_RELEASE_CAPSULE | Freq: Every day | ORAL | Status: DC

## 2015-10-25 MED ORDER — OMEPRAZOLE 40 MG PO CPDR: DELAYED_RELEASE_CAPSULE | ORAL | Status: DC

## 2015-10-25 NOTE — Telephone Encounter (Signed)
Patient notified that new rx has been sent.

## 2015-10-26 ENCOUNTER — Encounter (HOSPITAL_COMMUNITY): Payer: Self-pay

## 2015-10-30 ENCOUNTER — Encounter (HOSPITAL_COMMUNITY)
Admission: RE | Admit: 2015-10-30 | Discharge: 2015-10-30 | Disposition: A | Discharge status: Home / Self Care | Payer: Self-pay | Source: Ambulatory Visit | Attending: Cardiology | Admitting: Cardiology

## 2015-10-31 ENCOUNTER — Encounter (HOSPITAL_COMMUNITY)
Admission: RE | Admit: 2015-10-31 | Discharge: 2015-10-31 | Disposition: A | Discharge status: Home / Self Care | Payer: Self-pay | Source: Ambulatory Visit | Attending: Cardiology | Admitting: Cardiology

## 2015-11-02 ENCOUNTER — Encounter (HOSPITAL_COMMUNITY): Payer: Self-pay

## 2015-11-02 DIAGNOSIS — Z9861 Coronary angioplasty status: Secondary | ICD-10-CM | POA: Insufficient documentation

## 2015-11-06 ENCOUNTER — Encounter (HOSPITAL_COMMUNITY)
Admission: RE | Admit: 2015-11-06 | Discharge: 2015-11-06 | Disposition: A | Discharge status: Home / Self Care | Payer: Self-pay | Source: Ambulatory Visit | Attending: Cardiology | Admitting: Cardiology

## 2015-11-07 ENCOUNTER — Encounter (HOSPITAL_COMMUNITY)
Admission: RE | Admit: 2015-11-07 | Discharge: 2015-11-07 | Disposition: A | Discharge status: Home / Self Care | Payer: Self-pay | Source: Ambulatory Visit | Attending: Cardiology | Admitting: Cardiology

## 2015-11-09 ENCOUNTER — Encounter: Payer: Self-pay | Admitting: Gastroenterology

## 2015-11-09 ENCOUNTER — Encounter (HOSPITAL_COMMUNITY)
Admission: RE | Admit: 2015-11-09 | Discharge: 2015-11-09 | Disposition: A | Discharge status: Home / Self Care | Payer: Self-pay | Source: Ambulatory Visit | Attending: Cardiology | Admitting: Cardiology

## 2015-11-13 ENCOUNTER — Encounter (HOSPITAL_COMMUNITY): Payer: Self-pay

## 2015-11-14 ENCOUNTER — Encounter (HOSPITAL_COMMUNITY)
Admission: RE | Admit: 2015-11-14 | Discharge: 2015-11-14 | Disposition: A | Discharge status: Home / Self Care | Payer: Self-pay | Source: Ambulatory Visit | Attending: Cardiology | Admitting: Cardiology

## 2015-11-15 ENCOUNTER — Telehealth: Payer: Self-pay | Admitting: Gastroenterology

## 2015-11-16 ENCOUNTER — Encounter (HOSPITAL_COMMUNITY)
Admission: RE | Admit: 2015-11-16 | Discharge: 2015-11-16 | Disposition: A | Discharge status: Home / Self Care | Payer: Self-pay | Source: Ambulatory Visit | Attending: Cardiology | Admitting: Cardiology

## 2015-11-16 ENCOUNTER — Encounter: Payer: Self-pay | Admitting: *Deleted

## 2015-11-16 NOTE — Telephone Encounter (Signed)
If he's otherwise stable he can stop the plavix for 5 days and schedule the EGD directly. We should notify he prescribing physician. Thanks

## 2015-11-16 NOTE — Telephone Encounter (Signed)
Patient had an EGD in January. He is calling to schedule his recall EGD for April. He is on Plavix. Does he need an OV or just direct EGD with same instructions as  in January?

## 2015-11-16 NOTE — Telephone Encounter (Signed)
Spoke with patient and scheduled EGD on 11/29/15 at 9:30 AM and pre visit on 11/22/15 at 10:00 AM. Letter sent to Dr. Wynonia Lawman re; Plavix.

## 2015-11-20 ENCOUNTER — Encounter (HOSPITAL_COMMUNITY)
Admission: RE | Admit: 2015-11-20 | Discharge: 2015-11-20 | Disposition: A | Discharge status: Home / Self Care | Payer: Self-pay | Source: Ambulatory Visit | Attending: Cardiology | Admitting: Cardiology

## 2015-11-21 ENCOUNTER — Encounter (HOSPITAL_COMMUNITY)
Admission: RE | Admit: 2015-11-21 | Discharge: 2015-11-21 | Disposition: A | Discharge status: Home / Self Care | Payer: Self-pay | Source: Ambulatory Visit | Attending: Cardiology | Admitting: Cardiology

## 2015-11-21 ENCOUNTER — Telehealth: Payer: Self-pay | Admitting: *Deleted

## 2015-11-21 NOTE — Telephone Encounter (Signed)
Spoke with patient and told him we have heard from Dr. Wynonia Lawman and he can hold Plavix 5 days prior to procedure.

## 2015-11-21 NOTE — Telephone Encounter (Signed)
-----   Message from Hulan Saas, RN sent at 11/16/2015  1:47 PM EDT ----- Did Dr. Wynonia Lawman answer about Plavix?

## 2015-11-23 ENCOUNTER — Encounter (HOSPITAL_COMMUNITY): Payer: Self-pay

## 2015-11-27 ENCOUNTER — Encounter (HOSPITAL_COMMUNITY)
Admission: RE | Admit: 2015-11-27 | Discharge: 2015-11-27 | Disposition: A | Discharge status: Home / Self Care | Payer: Self-pay | Source: Ambulatory Visit | Attending: Cardiology | Admitting: Cardiology

## 2015-11-27 ENCOUNTER — Ambulatory Visit (AMBULATORY_SURGERY_CENTER): Payer: Self-pay | Admitting: *Deleted

## 2015-11-27 VITALS — Ht 66.0 in | Wt 159.0 lb

## 2015-11-27 DIAGNOSIS — K259 Gastric ulcer, unspecified as acute or chronic, without hemorrhage or perforation: Secondary | ICD-10-CM

## 2015-11-27 NOTE — Progress Notes (Signed)
No egg or soy allergy known to patient  No issues with past sedation with any surgeries  or procedures, no intubation problems  No diet pills per patient No home 02 use per patient   blood thinners per patient -on plavix 75 mg- to hold x 5 days prior to procedure - pt stopped the 23-  24th  Per pt. Pt denies issues with constipation

## 2015-11-28 ENCOUNTER — Encounter (HOSPITAL_COMMUNITY)
Admission: RE | Admit: 2015-11-28 | Discharge: 2015-11-28 | Disposition: A | Discharge status: Home / Self Care | Payer: Self-pay | Source: Ambulatory Visit | Attending: Cardiology | Admitting: Cardiology

## 2015-11-29 ENCOUNTER — Ambulatory Visit (AMBULATORY_SURGERY_CENTER): Payer: Medicare Other | Admitting: Gastroenterology

## 2015-11-29 ENCOUNTER — Encounter: Payer: Self-pay | Admitting: Gastroenterology

## 2015-11-29 VITALS — BP 152/81 | HR 86 | Temp 98.9°F | Resp 17 | Ht 66.0 in | Wt 159.0 lb

## 2015-11-29 DIAGNOSIS — K259 Gastric ulcer, unspecified as acute or chronic, without hemorrhage or perforation: Secondary | ICD-10-CM

## 2015-11-29 MED ORDER — SODIUM CHLORIDE 0.9 % IV SOLN: 500.0000 mL | INTRAVENOUS | Status: DC

## 2015-11-29 NOTE — Progress Notes (Signed)
No problems noted in the recovery room. Maw   Pt is HIPPA and he did not want his discharge instructions enclosed in an envelope.  Papers were held in his hand.  Pt requested to get off the monitor to go to the restroom.  I explained he had only been on the monitor for 5 minutes.  I offered him a urnal or bedpan.  He excepted a urnal.  Then on discharge pt reuuested for [place] to take him in the wheel chair to the car parked in the parking lot.  I explained we needed his care partner to bring the car to the main entrance and we would assit him into the car.  maw

## 2015-11-29 NOTE — Op Note (Signed)
[place] Patient Name: Brad Turner Procedure Date: 11/29/2015 9:43 AM MRN: [id number] Endoscopist: Remo Lipps P. Havery Moros , MD Age: 75 Referring MD:  Date of Birth: 1941/08/01 Gender: Male Procedure:                Upper GI endoscopy Indications:              Follow-up of acute gastric ulcer and severe                            gastritis, patient now on twice daily PPI                            (omeprazole) Medicines:                Monitored Anesthesia Care Procedure:                Pre-Anesthesia Assessment:                           - Prior to the procedure, a History and Physical                            was performed, and patient medications and                            allergies were reviewed. The patient's tolerance of                            previous anesthesia was also reviewed. The risks                            and benefits of the procedure and the sedation                            options and risks were discussed with the patient.                            All questions were answered, and informed consent                            was obtained. Prior Anticoagulants: The patient has                            taken Plavix (clopidogrel), last dose was 5 days                            prior to procedure. ASA Grade Assessment: III - A                            patient with severe systemic disease. After                            reviewing the risks and benefits, the patient was  deemed in satisfactory condition to undergo the                            procedure.                           After obtaining informed consent, the endoscope was                            passed under direct vision. Throughout the                            procedure, the patient's blood pressure, pulse, and                            oxygen saturations were monitored continuously. The                            Model [id number]  scope was introduced                            through the mouth, and advanced to the second part                            of duodenum. The upper GI endoscopy was                            accomplished without difficulty. The patient                            tolerated the procedure well. Scope In: Scope Out: Findings:      Esophagogastric landmarks were identified: the Z-line was found at 38       cm, the gastroesophageal junction was found at 38 cm and the upper       extent of the gastric folds was found at 39 cm from the incisors.      A 1 cm hiatal hernia was present.      The Z-line was irregular and was found 38 cm from the incisors. This was       previously biopsied on the last exam and no evidence of Barrett's       esophagus      Patchy mucosal changes characterized by multiple flat polyps were found       in the upper third of the esophagus. These were previously biopsied and       are benign.      A very small area localized mildly erythematous mucosa was found in the       gastric body. Biopsies were taken with a cold forceps for Helicobacter       pylori testing from the antrum and body.      The exam of the stomach was otherwise normal. The previously noted       gastric ulcer had healed. The previously noted severe gastritis has       healed and significantly improved.      The duodenal bulb and second portion of the duodenum were normal. Complications:  No immediate complications. Estimated blood loss:                            Minimal. Estimated Blood Loss:     Estimated blood loss was minimal. Impression:               - Esophagogastric landmarks identified.                           - 1 cm hiatal hernia.                           - Z-line irregular, 38 cm from the incisors.                           - Irregular mucosa in the esophagus - benign.                           - Erythematous mucosa in the gastric body. Biopsied.                            - Interval healing of gastric ulcer and gastritis                            otherwise                           - Normal duodenal bulb and second portion of the                            duodenum. Recommendation:           - Patient has a contact number available for                            emergencies. The signs and symptoms of potential                            delayed complications were discussed with the                            patient. Return to normal activities tomorrow.                            Written discharge instructions were provided to the                            patient.                           - Resume previous diet.                           - Continue present medications.                           - Await pathology results.                           -  No repeat upper endoscopy indicated at this time. Procedure Code(s):        --- Professional ---                           [phone], Esophagogastroduodenoscopy, flexible,                            transoral; with biopsy, single or multiple CPT copyright 2016 [employer]. All rights reserved. Remo Lipps P. Lilliahna Schubring, MD 11/29/2015 10:07:37 AM This report has been signed electronically. Number of Addenda: 0 Referring MD:      Jasper Loser. Luan Pulling, MD

## 2015-11-29 NOTE — Patient Instructions (Signed)
YOU HAD AN ENDOSCOPIC PROCEDURE TODAY AT [facility]:   Refer to the procedure report that was given to you for any specific questions about what was found during the examination.  If the procedure report does not answer your questions, please call your gastroenterologist to clarify.  If you requested that your care partner not be given the details of your procedure findings, then the procedure report has been included in a sealed envelope for you to review at your convenience later.  YOU SHOULD EXPECT: Some feelings of bloating in the abdomen. Passage of more gas than usual.  Walking can help get rid of the air that was put into your GI tract during the procedure and reduce the bloating. If you had a lower endoscopy (such as a colonoscopy or flexible sigmoidoscopy) you may notice spotting of blood in your stool or on the toilet paper. If you underwent a bowel prep for your procedure, you may not have a normal bowel movement for a few days.  Please Note:  You might notice some irritation and congestion in your nose or some drainage.  This is from the oxygen used during your procedure.  There is no need for concern and it should clear up in a Grismer or so.  SYMPTOMS TO REPORT IMMEDIATELY:    Following upper endoscopy (EGD)  Vomiting of blood or coffee ground material  New chest pain or pain under the shoulder blades  Painful or persistently difficult swallowing  New shortness of breath  Fever of 100F or higher  Black, tarry-looking stools  For urgent or emergent issues, a gastroenterologist can be reached at any hour by calling [phone].   DIET: Your first meal following the procedure should be a small meal and then it is ok to progress to your normal diet. Heavy or fried foods are harder to digest and may make you feel nauseous or bloated.  Likewise, meals heavy in dairy and vegetables can increase bloating.  Drink plenty of fluids but you should avoid alcoholic beverages  for 24 hours.  ACTIVITY:  You should plan to take it easy for the rest of today and you should NOT DRIVE or use heavy machinery until tomorrow (because of the sedation medicines used during the test).    FOLLOW UP: Our staff will call the number listed on your records the next business [place] following your procedure to check on you and address any questions or concerns that you may have regarding the information given to you following your procedure. If we do not reach you, we will leave a message.  However, if you are feeling well and you are not experiencing any problems, there is no need to return our call.  We will assume that you have returned to your regular daily activities without incident.  If any biopsies were taken you will be contacted by phone or by letter within the next 1-3 weeks.  Please call us at [phone] if you have not heard about the biopsies in 3 weeks.    SIGNATURES/CONFIDENTIALITY: You and/or your care partner have signed paperwork which will be entered into your electronic medical record.  These signatures attest to the fact that that the information above on your After Visit Summary has been reviewed and is understood.  Full responsibility of the confidentiality of this discharge information lies with you and/or your care-partner.    Handout was given to your care partner on a hiatal hernia. You may resume your PLAVIX today.  You may resume your current medications today. Await biopsy results. Please call if any questions or concerns.   

## 2015-11-29 NOTE — Progress Notes (Signed)
Called to room to assist during endoscopic procedure.  Patient ID and intended procedure confirmed with present staff. Received instructions for my participation in the procedure from the performing physician.  

## 2015-11-29 NOTE — Progress Notes (Signed)
To PACU awake alert Report to RN

## 2015-11-30 ENCOUNTER — Encounter (HOSPITAL_COMMUNITY)
Admission: RE | Admit: 2015-11-30 | Discharge: 2015-11-30 | Disposition: A | Discharge status: Home / Self Care | Payer: Self-pay | Source: Ambulatory Visit | Attending: Cardiology | Admitting: Cardiology

## 2015-11-30 ENCOUNTER — Telehealth: Payer: Self-pay

## 2015-11-30 NOTE — Telephone Encounter (Signed)
  Follow up Call-  Call back number 11/29/2015 09/08/2015  Post procedure Call Back phone  # [phone]  Permission to leave phone message Yes Yes     Patient questions:  Do you have a fever, pain , or abdominal swelling? No. Pain Score  0 *  Have you tolerated food without any problems? Yes.    Have you been able to return to your normal activities? Yes.    Do you have any questions about your discharge instructions: Diet   No. Medications  No. Follow up visit  No.  Do you have questions or concerns about your Care? No.  Actions: * If pain score is 4 or above: No action needed, pain <4.

## 2015-12-04 ENCOUNTER — Encounter (HOSPITAL_COMMUNITY)
Admission: RE | Admit: 2015-12-04 | Discharge: 2015-12-04 | Disposition: A | Discharge status: Home / Self Care | Payer: Self-pay | Source: Ambulatory Visit | Attending: Cardiology | Admitting: Cardiology

## 2015-12-04 DIAGNOSIS — Z9861 Coronary angioplasty status: Secondary | ICD-10-CM | POA: Insufficient documentation

## 2015-12-05 ENCOUNTER — Encounter: Payer: Self-pay | Admitting: Gastroenterology

## 2015-12-05 ENCOUNTER — Encounter (HOSPITAL_COMMUNITY)
Admission: RE | Admit: 2015-12-05 | Discharge: 2015-12-05 | Disposition: A | Discharge status: Home / Self Care | Payer: Self-pay | Source: Ambulatory Visit | Attending: Cardiology | Admitting: Cardiology

## 2015-12-07 ENCOUNTER — Encounter (HOSPITAL_COMMUNITY)
Admission: RE | Admit: 2015-12-07 | Discharge: 2015-12-07 | Disposition: A | Discharge status: Home / Self Care | Payer: Self-pay | Source: Ambulatory Visit | Attending: Cardiology | Admitting: Cardiology

## 2015-12-07 ENCOUNTER — Ambulatory Visit (INDEPENDENT_AMBULATORY_CARE_PROVIDER_SITE_OTHER): Payer: Medicare Other | Admitting: Pulmonary Disease

## 2015-12-07 ENCOUNTER — Encounter: Payer: Self-pay | Admitting: Pulmonary Disease

## 2015-12-07 VITALS — BP 128/82 | HR 82 | Temp 98.1°F | Ht 66.0 in | Wt 159.0 lb

## 2015-12-07 DIAGNOSIS — J439 Emphysema, unspecified: Secondary | ICD-10-CM | POA: Diagnosis not present

## 2015-12-07 DIAGNOSIS — R918 Other nonspecific abnormal finding of lung field: Secondary | ICD-10-CM

## 2015-12-07 DIAGNOSIS — F17210 Nicotine dependence, cigarettes, uncomplicated: Secondary | ICD-10-CM

## 2015-12-07 DIAGNOSIS — K219 Gastro-esophageal reflux disease without esophagitis: Secondary | ICD-10-CM

## 2015-12-07 DIAGNOSIS — I2510 Atherosclerotic heart disease of native coronary artery without angina pectoris: Secondary | ICD-10-CM

## 2015-12-07 DIAGNOSIS — Z720 Tobacco use: Secondary | ICD-10-CM

## 2015-12-07 DIAGNOSIS — R1310 Dysphagia, unspecified: Secondary | ICD-10-CM

## 2015-12-07 NOTE — Patient Instructions (Signed)
Today we updated your med list in our EPIC system...    Continue your current medications the same...  We decided to add INCRUSE one inhalation daily to your treatment meds...  Continue the MUCINEX 600mg  tabs- one tab 4 times daily w/ fluids...  Call for any questions...  Let's plan a follow up visit in 88mo, sooner if needed for breathing problems.Marland KitchenMarland Kitchen

## 2015-12-09 ENCOUNTER — Encounter: Payer: Self-pay | Admitting: Pulmonary Disease

## 2015-12-09 NOTE — Progress Notes (Signed)
Subjective:    Patient ID: Brad Turner, male    DOB: July 29, 1941, 75 y.o.   MRN: [id number]  HPI  ~  October 07, 2014:  1wk ROV w/ KC>      Pt saw DrMR  09/29/14 w/ COPD exac treated w/ Doxy & Pred...      Patient comes in today for worsening cough and classic description of cough syncope. His cough is high-pitched and barking in nature, and only produces white mucus. He does not feel congested, nor has he had increasing shortness of breath when not coughing. He has had episodes of syncope that are related to coughing episodes and paroxysms. Unfortunately, he continues to smoke, and is also on an ACE inhibitor.      PLAN>  The patient is describing classic cough syncope, and clearly his cough is upper airway in origin. It is a high-pitched barking cough and he has no purulence or congestion. His lungs are also totally clear to auscultation today. The patient has hoarseness and is describing a classic globus sensation. At this point, I think his cough is upper airway in origin and primarily related to cyclical coughing and his ACE inhibitor. Will discontinue this medication, and also work on cough suppression. I have also reviewed behavioral therapies to help with his globus sensation and urge to cough. Finally, I have stressed to him the importance of total smoking cessation.  ~  December 12, 2014:  106mo ROV w/ KC>        The patient comes in today for follow-up of his known COPD. He is continuing to smoke, and continues to have chronic dyspnea on exertion and excessive mucus production. However, he feels that his symptoms are within his usual baseline. He has finished up his drug study, and currently is not on an anti-cholinergic.      PLAN>  The patient feels that his breathing is near his usual baseline, and unfortunately continues to smoke. He is now off his prior drug study, and I would like to add an anti-cholinergic to his Symbicort to try and improve his dyspnea on exertion. I have also talked  with him again about total smoking cessation, and help this is the key to getting better.   CT Chest 09/17/13>  Clearing of prior RLL opac, 3 small nodules on the left- unchanged from 2011, severe emphysematous change, pos coronary calcif  Spirometry 11/22/13 at [facility]> FVC=3.26 (89%), FEV1=1.84 (69%), %1sec=56;  This is c/w a moderate obstructive ventilatory defect  V/Q Lung Scan 10/29/14>  Patchy nonsegmental ventilation defects, patchy scattered nonsegmental perfusion defects, felt to be a low prob scan...  CXR 10/29/14>  Heart is upper lim of norm, hyperinflated/ emphysematous lungs- NAD, DJD spine...  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     Cigarette smoker>  Still smoking 1.5ppd and he has smoked 1-1.5ppd for ~60 yrs; he declines smoking cessation help & is not motivated to quit...    Hx cough syncope> aware    CARDIAC issues> HBP, CAD, HL- followed by DrTilley (no notes in Epic)- on Plavix75, Amlod5-1/2, Diovan80, Lip80, [phone]; he remains in cardiac rehab regularly...    Dysphagia,  hx esoph stricture & Barrett's esoph> on Dexilant60; eval by DrKaplan w/ hx dilatation in the past    GU issues> on Myrbetriq50, Cialis5    Ortho- LBP w/ lumbar lam 2016 by DrDumonski    Anxiety/depression> on WellbutrinXL150; his son is treated for depression, friend died w/ metastatic lung cancer EXAM shows Afeb, VSS, O2sat=94% on RA;  HEENT- dental issues, mallampati2;  Chest- decr BS bilat w/o w/r/r;  Heart- RR w/o m/r/g;  Abd- soft, nontender, neg;  Ext- w/o c/c/e;  Neuro- intact...  Low dose screening CT Chest10/14/16>  Norm heart size, coronary & aortic calcif, 67mm noncalcif nodule in LUL,  scat calcif granulomas up to 2.19mm; mod centrilobular & paraseptal emphysema, abd is ok w/ 2cm cyst in liver...  IMP/PLAN>>  Brad Turner & I discussed his continued smoking & he is not motivated to quit- offered meds and he will call if he changes his mind;  Rec to continue Symbicort160-2spBid, he was tried on Spiriva respimat in past but wouldn't fill it due to cost- I pointed out that if he'd quit smoking he could get the Spiriva & noted that his breathing would be better due to both things! Rec to add Mucinex 2400mg /d.Marland KitchenMarland KitchenWe plan ROV 23months.  ~  December 07, 2015:  81mo ROV w/ SN>  PCP- DrTisovec, Cards- DrTilley, Gi- DrArmbruster...    Brad Turner has had some recent dental work w/ mult teeth pulled; he remains in cardiac rehab maintenance program and doing well; no new complaints or concerns; he is using his Symbicort160-2spBid, not using the Mucinex; he has chr stable DOE, min cough, small amt beige sput, no CP/ palpit/ edema...    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     Cigarette smoker>  Still smoking 1.5ppd and he has smoked 1-1.5ppd for ~60 yrs; he declines smoking cessation help & is not motivated to quit...    Hx cough syncope> aware    CARDIAC issues> HBP, CAD, HL- followed by DrTilley (no notes in Epic)- on Plavix75, Amlod5-1/2, Diovan80, Lip80, [phone]; he remains in cardiac rehab regularly... EXAM shows Afeb, VSS, O2sat=92% on RA;  HEENT- dental issues, mallampati2;  Chest- decr BS bilat w/o w/r/r;  Heart- RR w/o m/r/g;  Abd- soft, nontender, neg;  Ext- w/o c/c/e;  Neuro- intact... IMP/PLAN>>  Brad Turner is unable to quit smoking and has mod COPD w/ emphysema on his CTscans- we will add INCRUSE one inhalation daily; he knows to avoid infections & be sure he is up to date on all vaccinations from DrTisovec... we plan ROV 28mo at which time he will need his  next screening CT Chest...    Past Medical History  Diagnosis Date  . CVA (cerebral infarction)   . Hyperlipidemia   . GERD (gastroesophageal reflux disease)   . CAD (coronary artery disease)   . Arthritis   . Hypertension   . Cataracts, bilateral   . 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 COPD (chronic obstructive pulmonary disease) ([place])   . Emphysema lung ([place])   . Pneumonia     hx of  . Depression   . BPH (benign prostatic hyperplasia)   . Urgency of urination     and frequency of urination at bedtime  . History of kidney stones   . Skin cancer   . Barrett esophagus   . Stroke [facility]) 1995  Affected balance; and has very emotional if watching a movie  . Allergy     seasonal    Past Surgical History  Procedure Laterality Date  . Appendectomy    . Angioplasty      x4  . Tonsillectomy    . Rotator cuff repair      bilateral  . Perithyroid    . Cataracts Bilateral   . Cystoscopy  07/21/2013    Dr. Jeffie Pollock  . Cardiac catheterization    . Lumbar laminectomy/decompression microdiscectomy N/A 01/12/2015    Procedure: LUMBAR LAMINECTOMY/DECOMPRESSION MICRODISCECTOMY 3 LEVELS;  Surgeon: Phylliss Bob, MD;  Location: [place];  Service: Orthopedics;  Laterality: N/A;  Lumbar 3-4, lumbar 4-5, lumbar 5-sacrum 1 decompression  . Colonoscopy    . Polypectomy    . Esophageal manometry N/A 09/20/2015    Procedure: ESOPHAGEAL MANOMETRY (EM);  Surgeon: Manus Gunning, MD;  Location: [facility];  Service: Gastroenterology;  Laterality: N/A;  . Upper gastrointestinal endoscopy    . Dental surgery      with sedation    Outpatient Encounter Prescriptions as of 12/07/2015  Medication Sig  . acetaminophen (TYLENOL) 325 MG tablet Take 325 mg by mouth every 6 (six) hours as needed for mild pain or moderate pain. Reported on 11/29/2015  . albuterol (PROVENTIL HFA;VENTOLIN HFA) 108 (90 BASE) MCG/ACT inhaler Inhale 2 puffs into the lungs every 6 (six) hours as needed for  wheezing or shortness of breath. Reported on 11/29/2015  . alfuzosin (UROXATRAL) 10 MG 24 hr tablet   . amLODipine (NORVASC) 5 MG tablet Take 2.5 mg by mouth daily.  Marland Kitchen atorvastatin (LIPITOR) 80 MG tablet Take 80 mg by mouth every morning.   . clopidogrel (PLAVIX) 75 MG tablet Take 1 tablet by mouth daily.  . fenofibrate (TRICOR) 145 MG tablet Take 145 mg by mouth every morning.   . folic acid (FOLVITE) Q000111Q MCG tablet Take 800 mcg by mouth daily.  . hydroxypropyl methylcellulose / hypromellose (ISOPTO TEARS / GONIOVISC) 2.5 % ophthalmic solution Place 1 drop into both eyes as needed for dry eyes. Reported on 11/29/2015  . MYRBETRIQ 50 MG TB24 tablet Take 1 tablet by mouth daily.  Marland Kitchen NITROSTAT 0.4 MG SL tablet Place 0.4 mg under the tongue every 5 (five) minutes as needed for chest pain. Reported on 11/29/2015  . omeprazole (PRILOSEC) 40 MG capsule Take one po  BID  . SYMBICORT 160-4.5 MCG/ACT inhaler INHALE TWO PUFFS INTO LUNGS TWICE DAILY  . tadalafil (CIALIS) 5 MG tablet Take 5 mg by mouth daily. For BPH  . valsartan (DIOVAN) 80 MG tablet Take 1 tablet (80 mg total) by mouth daily.   No facility-administered encounter medications on file as of 12/07/2015.    Allergies  Allergen Reactions  . Ivp Dye [Iodinated Diagnostic Agents] Hives and Shortness Of Breath  . Macrodantin [Nitrofurantoin] Other (See Comments)    Fever that lasted several months    Immunization History  Administered Date(s) Administered  . Influenza Split 05/14/2012  . Influenza Whole 06/02/2010, 05/09/2011  . Influenza,inj,Quad PF,36+ Mos 06/02/2013, 05/24/2014  . Influenza-Unspecified 05/31/2015  . Pneumococcal Polysaccharide-23 06/02/2009  . Tdap 10/29/2014    Current Medications, Allergies, Past Medical History, Past Surgical History, Family History, and Social History were reviewed in [employer] record.   Review of Systems  Constitutional: Negative for fever and unexpected weight change.    HENT: Positive for congestion and postnasal drip. Negative for dental problem, ear pain, nosebleeds, rhinorrhea, sinus pressure, sneezing,  sore throat and trouble swallowing.   Eyes: Negative for redness and itching.  Respiratory: Positive for cough and shortness of breath. Negative for chest tightness and wheezing.   Cardiovascular: Negative for chest pain, palpitations and leg swelling.  Gastrointestinal: Negative for nausea and vomiting.  Genitourinary: Negative for dysuria.  Musculoskeletal: Negative for joint swelling.  Skin: Negative for rash.  Neurological: Negative for headaches.  Hematological: Does not bruise/bleed easily.  Psychiatric/Behavioral: Negative for dysphoric mood. The patient is not nervous/anxious.       Objective:   Physical Exam  Well-developed male in no acute distress Nose without purulence or discharge noted Neck without lymphadenopathy or thyromegaly Chest with decreased breath sounds, no wheezes or rhonchi Cardiac exam with regular rate and rhythm Lower extremities without edema, no cyanosis Alert and oriented, moves all 4 extremities.     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     Cigarette smoker>  Still smoking 1.5ppd and he has smoked 1-1.5ppd for ~60 yrs; he declines smoking cessation help & is not motivated to quit...    Hx cough syncope> aware    CARDIAC issues> HBP, CAD, HL- followed by DrTilley (no notes in Epic)- on Plavix75, Amlod5-1/2, Diovan80, Lip80, [phone]; he remains in cardiac rehab regularly...    Dysphagia, hx esoph stricture & Barrett's esoph> on Dexilant60; eval by DrKaplan w/ hx dilatation in the past    GU issues> on Myrbetriq50, Cialis5    Ortho- LBP w/ lumbar lam 2016 by DrDumonski    Anxiety/depression> on WellbutrinXL150; his son is treated  for depression, friend died w/ metastatic lung cancer  PLAN >>   06/07/15>   Brad Turner & I discussed his continued smoking & he is not motivated to quit- offered meds and he will call if he changes his mind;  Rec to continue Symbicort160-2spBid, he was tried on Spiriva respimat in past but wouldn't fill it due to cost- I pointed out that if he'd quit smoking he could get the Spiriva & noted that his breathing would be better due to both things! Rec to add Mucinex 2400mg /d.Marland KitchenMarland KitchenWe plan ROV 26months. 12/07/15>   Brad Turner is unable to quit smoking and has mod COPD w/ emphysema on his CTscans- we will add INCRUSE one inhalation daily; he knows to avoid infections & be sure he is up to date on all vaccinations from DrTisovec   Patient's Medications  New Prescriptions                                                                INCRUSE >>  One inhalation daily...  Previous Medications   ACETAMINOPHEN (TYLENOL) 325 MG TABLET    Take 325 mg by mouth every 6 (six) hours as needed for mild pain or moderate pain. Reported on 11/29/2015   ALBUTEROL (PROVENTIL HFA;VENTOLIN HFA) 108 (90 BASE) MCG/ACT INHALER    Inhale 2 puffs into the lungs every 6 (six) hours as needed for wheezing or shortness of breath. Reported on 11/29/2015   ALFUZOSIN (UROXATRAL) 10 MG 24 HR TABLET       AMLODIPINE (NORVASC) 5  MG TABLET    Take 2.5 mg by mouth daily.   ATORVASTATIN (LIPITOR) 80 MG TABLET    Take 80 mg by mouth every morning.    CLOPIDOGREL (PLAVIX) 75 MG TABLET    Take 1 tablet by mouth daily.   FENOFIBRATE (TRICOR) 145 MG TABLET    Take 145 mg by mouth every morning.    FOLIC ACID (FOLVITE) Q000111Q MCG TABLET    Take 800 mcg by mouth daily.   HYDROXYPROPYL METHYLCELLULOSE / HYPROMELLOSE (ISOPTO TEARS / GONIOVISC) 2.5 % OPHTHALMIC SOLUTION    Place 1 drop into both eyes as needed for dry eyes. Reported on 11/29/2015   MYRBETRIQ 50 MG TB24 TABLET    Take 1 tablet by mouth daily.   NITROSTAT 0.4 MG SL TABLET    Place 0.4 mg under the  tongue every 5 (five) minutes as needed for chest pain. Reported on 11/29/2015   OMEPRAZOLE (PRILOSEC) 40 MG CAPSULE    Take one po  BID   SYMBICORT 160-4.5 MCG/ACT INHALER    INHALE TWO PUFFS INTO LUNGS TWICE DAILY   TADALAFIL (CIALIS) 5 MG TABLET    Take 5 mg by mouth daily. For BPH   VALSARTAN (DIOVAN) 80 MG TABLET    Take 1 tablet (80 mg total) by mouth daily.  Modified Medications   No medications on file  Discontinued Medications   No medications on file

## 2015-12-11 ENCOUNTER — Encounter (HOSPITAL_COMMUNITY)
Admission: RE | Admit: 2015-12-11 | Discharge: 2015-12-11 | Disposition: A | Discharge status: Home / Self Care | Payer: Self-pay | Source: Ambulatory Visit | Attending: Cardiology | Admitting: Cardiology

## 2015-12-12 ENCOUNTER — Encounter (HOSPITAL_COMMUNITY)
Admission: RE | Admit: 2015-12-12 | Discharge: 2015-12-12 | Disposition: A | Discharge status: Home / Self Care | Payer: Self-pay | Source: Ambulatory Visit | Attending: Cardiology | Admitting: Cardiology

## 2015-12-14 ENCOUNTER — Encounter (HOSPITAL_COMMUNITY)
Admission: RE | Admit: 2015-12-14 | Discharge: 2015-12-14 | Disposition: A | Discharge status: Home / Self Care | Payer: Self-pay | Source: Ambulatory Visit | Attending: Cardiology | Admitting: Cardiology

## 2015-12-18 ENCOUNTER — Encounter (HOSPITAL_COMMUNITY)
Admission: RE | Admit: 2015-12-18 | Discharge: 2015-12-18 | Disposition: A | Discharge status: Home / Self Care | Payer: Self-pay | Source: Ambulatory Visit | Attending: Cardiology | Admitting: Cardiology

## 2015-12-19 ENCOUNTER — Encounter (HOSPITAL_COMMUNITY)
Admission: RE | Admit: 2015-12-19 | Discharge: 2015-12-19 | Disposition: A | Discharge status: Home / Self Care | Payer: Self-pay | Source: Ambulatory Visit | Attending: Cardiology | Admitting: Cardiology

## 2015-12-21 ENCOUNTER — Encounter (HOSPITAL_COMMUNITY)
Admission: RE | Admit: 2015-12-21 | Discharge: 2015-12-21 | Disposition: A | Discharge status: Home / Self Care | Payer: Self-pay | Source: Ambulatory Visit | Attending: Cardiology | Admitting: Cardiology

## 2015-12-25 ENCOUNTER — Encounter (HOSPITAL_COMMUNITY)
Admission: RE | Admit: 2015-12-25 | Discharge: 2015-12-25 | Disposition: A | Discharge status: Home / Self Care | Payer: Self-pay | Source: Ambulatory Visit | Attending: Cardiology | Admitting: Cardiology

## 2015-12-26 ENCOUNTER — Encounter (HOSPITAL_COMMUNITY)
Admission: RE | Admit: 2015-12-26 | Discharge: 2015-12-26 | Disposition: A | Discharge status: Home / Self Care | Payer: Self-pay | Source: Ambulatory Visit | Attending: Cardiology | Admitting: Cardiology

## 2015-12-28 ENCOUNTER — Encounter (HOSPITAL_COMMUNITY)
Admission: RE | Admit: 2015-12-28 | Discharge: 2015-12-28 | Disposition: A | Discharge status: Home / Self Care | Payer: Self-pay | Source: Ambulatory Visit | Attending: Cardiology | Admitting: Cardiology

## 2016-01-01 ENCOUNTER — Encounter (HOSPITAL_COMMUNITY)
Admission: RE | Admit: 2016-01-01 | Discharge: 2016-01-01 | Disposition: A | Discharge status: Home / Self Care | Payer: Self-pay | Source: Ambulatory Visit | Attending: Cardiology | Admitting: Cardiology

## 2016-01-01 DIAGNOSIS — Z9861 Coronary angioplasty status: Secondary | ICD-10-CM | POA: Insufficient documentation

## 2016-01-02 ENCOUNTER — Encounter (HOSPITAL_COMMUNITY)
Admission: RE | Admit: 2016-01-02 | Discharge: 2016-01-02 | Disposition: A | Discharge status: Home / Self Care | Payer: Self-pay | Source: Ambulatory Visit | Attending: Cardiology | Admitting: Cardiology

## 2016-01-04 ENCOUNTER — Encounter (HOSPITAL_COMMUNITY): Payer: Self-pay

## 2016-01-08 ENCOUNTER — Encounter (HOSPITAL_COMMUNITY)
Admission: RE | Admit: 2016-01-08 | Discharge: 2016-01-08 | Disposition: A | Discharge status: Home / Self Care | Payer: Self-pay | Source: Ambulatory Visit | Attending: Cardiology | Admitting: Cardiology

## 2016-01-09 ENCOUNTER — Encounter (HOSPITAL_COMMUNITY)
Admission: RE | Admit: 2016-01-09 | Discharge: 2016-01-09 | Disposition: A | Discharge status: Home / Self Care | Payer: Self-pay | Source: Ambulatory Visit | Attending: Cardiology | Admitting: Cardiology

## 2016-01-11 ENCOUNTER — Encounter (HOSPITAL_COMMUNITY)
Admission: RE | Admit: 2016-01-11 | Discharge: 2016-01-11 | Disposition: A | Discharge status: Home / Self Care | Payer: Self-pay | Source: Ambulatory Visit | Attending: Cardiology | Admitting: Cardiology

## 2016-01-15 ENCOUNTER — Encounter (HOSPITAL_COMMUNITY)
Admission: RE | Admit: 2016-01-15 | Discharge: 2016-01-15 | Disposition: A | Discharge status: Home / Self Care | Payer: Self-pay | Source: Ambulatory Visit | Attending: Cardiology | Admitting: Cardiology

## 2016-01-16 ENCOUNTER — Encounter (HOSPITAL_COMMUNITY)
Admission: RE | Admit: 2016-01-16 | Discharge: 2016-01-16 | Disposition: A | Discharge status: Home / Self Care | Payer: Self-pay | Source: Ambulatory Visit | Attending: Cardiology | Admitting: Cardiology

## 2016-01-18 ENCOUNTER — Encounter (HOSPITAL_COMMUNITY)
Admission: RE | Admit: 2016-01-18 | Discharge: 2016-01-18 | Disposition: A | Discharge status: Home / Self Care | Payer: Self-pay | Source: Ambulatory Visit | Attending: Cardiology | Admitting: Cardiology

## 2016-01-22 ENCOUNTER — Encounter (HOSPITAL_COMMUNITY)
Admission: RE | Admit: 2016-01-22 | Discharge: 2016-01-22 | Disposition: A | Discharge status: Home / Self Care | Payer: Self-pay | Source: Ambulatory Visit | Attending: Cardiology | Admitting: Cardiology

## 2016-01-23 ENCOUNTER — Encounter (HOSPITAL_COMMUNITY)
Admission: RE | Admit: 2016-01-23 | Discharge: 2016-01-23 | Disposition: A | Discharge status: Home / Self Care | Payer: Self-pay | Source: Ambulatory Visit | Attending: Cardiology | Admitting: Cardiology

## 2016-01-25 ENCOUNTER — Encounter (HOSPITAL_COMMUNITY)
Admission: RE | Admit: 2016-01-25 | Discharge: 2016-01-25 | Disposition: A | Discharge status: Home / Self Care | Payer: Self-pay | Source: Ambulatory Visit | Attending: Cardiology | Admitting: Cardiology

## 2016-01-30 ENCOUNTER — Encounter (HOSPITAL_COMMUNITY)
Admission: RE | Admit: 2016-01-30 | Discharge: 2016-01-30 | Disposition: A | Discharge status: Home / Self Care | Payer: Self-pay | Source: Ambulatory Visit | Attending: Cardiology | Admitting: Cardiology

## 2016-02-01 ENCOUNTER — Encounter (HOSPITAL_COMMUNITY)
Admission: RE | Admit: 2016-02-01 | Discharge: 2016-02-01 | Disposition: A | Discharge status: Home / Self Care | Payer: Self-pay | Source: Ambulatory Visit | Attending: Cardiology | Admitting: Cardiology

## 2016-02-01 DIAGNOSIS — Z9861 Coronary angioplasty status: Secondary | ICD-10-CM | POA: Insufficient documentation

## 2016-02-05 ENCOUNTER — Encounter (HOSPITAL_COMMUNITY)
Admission: RE | Admit: 2016-02-05 | Discharge: 2016-02-05 | Disposition: A | Discharge status: Home / Self Care | Payer: Self-pay | Source: Ambulatory Visit | Attending: Cardiology | Admitting: Cardiology

## 2016-02-06 ENCOUNTER — Encounter (HOSPITAL_COMMUNITY)
Admission: RE | Admit: 2016-02-06 | Discharge: 2016-02-06 | Disposition: A | Discharge status: Home / Self Care | Payer: Self-pay | Source: Ambulatory Visit | Attending: Cardiology | Admitting: Cardiology

## 2016-02-08 ENCOUNTER — Encounter (HOSPITAL_COMMUNITY)
Admission: RE | Admit: 2016-02-08 | Discharge: 2016-02-08 | Disposition: A | Discharge status: Home / Self Care | Payer: Self-pay | Source: Ambulatory Visit | Attending: Cardiology | Admitting: Cardiology

## 2016-02-12 ENCOUNTER — Encounter (HOSPITAL_COMMUNITY)
Admission: RE | Admit: 2016-02-12 | Discharge: 2016-02-12 | Disposition: A | Discharge status: Home / Self Care | Payer: Self-pay | Source: Ambulatory Visit | Attending: Cardiology | Admitting: Cardiology

## 2016-02-13 ENCOUNTER — Encounter (HOSPITAL_COMMUNITY): Payer: Self-pay

## 2016-02-15 ENCOUNTER — Encounter (HOSPITAL_COMMUNITY)
Admission: RE | Admit: 2016-02-15 | Discharge: 2016-02-15 | Disposition: A | Discharge status: Home / Self Care | Payer: Self-pay | Source: Ambulatory Visit | Attending: Cardiology | Admitting: Cardiology

## 2016-02-19 ENCOUNTER — Encounter (HOSPITAL_COMMUNITY): Admission: RE | Admit: 2016-02-19 | Payer: Self-pay | Source: Ambulatory Visit

## 2016-02-20 ENCOUNTER — Encounter (HOSPITAL_COMMUNITY)
Admission: RE | Admit: 2016-02-20 | Discharge: 2016-02-20 | Disposition: A | Discharge status: Home / Self Care | Payer: Self-pay | Source: Ambulatory Visit | Attending: Cardiology | Admitting: Cardiology

## 2016-02-22 ENCOUNTER — Encounter (HOSPITAL_COMMUNITY)
Admission: RE | Admit: 2016-02-22 | Discharge: 2016-02-22 | Disposition: A | Discharge status: Home / Self Care | Payer: Self-pay | Source: Ambulatory Visit | Attending: Cardiology | Admitting: Cardiology

## 2016-02-26 ENCOUNTER — Encounter (HOSPITAL_COMMUNITY)
Admission: RE | Admit: 2016-02-26 | Discharge: 2016-02-26 | Disposition: A | Discharge status: Home / Self Care | Payer: Self-pay | Source: Ambulatory Visit | Attending: Cardiology | Admitting: Cardiology

## 2016-02-27 ENCOUNTER — Encounter (HOSPITAL_COMMUNITY): Payer: Self-pay

## 2016-02-29 ENCOUNTER — Encounter (HOSPITAL_COMMUNITY)
Admission: RE | Admit: 2016-02-29 | Discharge: 2016-02-29 | Disposition: A | Discharge status: Home / Self Care | Payer: Self-pay | Source: Ambulatory Visit | Attending: Cardiology | Admitting: Cardiology

## 2016-03-04 ENCOUNTER — Encounter (HOSPITAL_COMMUNITY)
Admission: RE | Admit: 2016-03-04 | Discharge: 2016-03-04 | Disposition: A | Discharge status: Home / Self Care | Payer: Self-pay | Source: Ambulatory Visit | Attending: Cardiology | Admitting: Cardiology

## 2016-03-04 DIAGNOSIS — Z9861 Coronary angioplasty status: Secondary | ICD-10-CM | POA: Insufficient documentation

## 2016-03-07 ENCOUNTER — Encounter (HOSPITAL_COMMUNITY): Payer: Self-pay

## 2016-03-11 ENCOUNTER — Encounter (HOSPITAL_COMMUNITY)
Admission: RE | Admit: 2016-03-11 | Discharge: 2016-03-11 | Disposition: A | Discharge status: Home / Self Care | Payer: Self-pay | Source: Ambulatory Visit | Attending: Cardiology | Admitting: Cardiology

## 2016-03-12 ENCOUNTER — Encounter (HOSPITAL_COMMUNITY): Payer: Self-pay

## 2016-03-14 ENCOUNTER — Encounter (HOSPITAL_COMMUNITY)
Admission: RE | Admit: 2016-03-14 | Discharge: 2016-03-14 | Disposition: A | Discharge status: Home / Self Care | Payer: Self-pay | Source: Ambulatory Visit | Attending: Cardiology | Admitting: Cardiology

## 2016-03-18 ENCOUNTER — Encounter (HOSPITAL_COMMUNITY)
Admission: RE | Admit: 2016-03-18 | Discharge: 2016-03-18 | Disposition: A | Discharge status: Home / Self Care | Payer: Self-pay | Source: Ambulatory Visit | Attending: Cardiology | Admitting: Cardiology

## 2016-03-19 ENCOUNTER — Encounter (HOSPITAL_COMMUNITY)
Admission: RE | Admit: 2016-03-19 | Discharge: 2016-03-19 | Disposition: A | Discharge status: Home / Self Care | Payer: Self-pay | Source: Ambulatory Visit | Attending: Cardiology | Admitting: Cardiology

## 2016-03-21 ENCOUNTER — Encounter (HOSPITAL_COMMUNITY)
Admission: RE | Admit: 2016-03-21 | Discharge: 2016-03-21 | Disposition: A | Discharge status: Home / Self Care | Payer: Self-pay | Source: Ambulatory Visit | Attending: Cardiology | Admitting: Cardiology

## 2016-03-25 ENCOUNTER — Encounter (HOSPITAL_COMMUNITY)
Admission: RE | Admit: 2016-03-25 | Discharge: 2016-03-25 | Disposition: A | Discharge status: Home / Self Care | Payer: Self-pay | Source: Ambulatory Visit | Attending: Cardiology | Admitting: Cardiology

## 2016-03-26 ENCOUNTER — Encounter (HOSPITAL_COMMUNITY)
Admission: RE | Admit: 2016-03-26 | Discharge: 2016-03-26 | Disposition: A | Discharge status: Home / Self Care | Payer: Self-pay | Source: Ambulatory Visit | Attending: Cardiology | Admitting: Cardiology

## 2016-03-28 ENCOUNTER — Encounter (HOSPITAL_COMMUNITY): Payer: Self-pay

## 2016-04-01 ENCOUNTER — Encounter (HOSPITAL_COMMUNITY)
Admission: RE | Admit: 2016-04-01 | Discharge: 2016-04-01 | Disposition: A | Discharge status: Home / Self Care | Payer: Self-pay | Source: Ambulatory Visit | Attending: Cardiology | Admitting: Cardiology

## 2016-04-02 ENCOUNTER — Encounter (HOSPITAL_COMMUNITY)
Admission: RE | Admit: 2016-04-02 | Discharge: 2016-04-02 | Disposition: A | Discharge status: Home / Self Care | Payer: Self-pay | Source: Ambulatory Visit | Attending: Cardiology | Admitting: Cardiology

## 2016-04-02 DIAGNOSIS — Z9861 Coronary angioplasty status: Secondary | ICD-10-CM | POA: Insufficient documentation

## 2016-04-04 ENCOUNTER — Encounter (HOSPITAL_COMMUNITY)
Admission: RE | Admit: 2016-04-04 | Discharge: 2016-04-04 | Disposition: A | Discharge status: Home / Self Care | Payer: Self-pay | Source: Ambulatory Visit | Attending: Cardiology | Admitting: Cardiology

## 2016-04-08 ENCOUNTER — Encounter (HOSPITAL_COMMUNITY): Payer: Self-pay

## 2016-04-09 ENCOUNTER — Encounter (HOSPITAL_COMMUNITY): Payer: Self-pay

## 2016-04-11 ENCOUNTER — Encounter (HOSPITAL_COMMUNITY): Payer: Self-pay

## 2016-04-15 ENCOUNTER — Encounter (HOSPITAL_COMMUNITY)
Admission: RE | Admit: 2016-04-15 | Discharge: 2016-04-15 | Disposition: A | Discharge status: Home / Self Care | Payer: Self-pay | Source: Ambulatory Visit | Attending: Cardiology | Admitting: Cardiology

## 2016-04-16 ENCOUNTER — Encounter (HOSPITAL_COMMUNITY)
Admission: RE | Admit: 2016-04-16 | Discharge: 2016-04-16 | Disposition: A | Discharge status: Home / Self Care | Payer: Self-pay | Source: Ambulatory Visit | Attending: Cardiology | Admitting: Cardiology

## 2016-04-18 ENCOUNTER — Encounter (HOSPITAL_COMMUNITY)
Admission: RE | Admit: 2016-04-18 | Discharge: 2016-04-18 | Disposition: A | Discharge status: Home / Self Care | Payer: Self-pay | Source: Ambulatory Visit | Attending: Cardiology | Admitting: Cardiology

## 2016-04-22 ENCOUNTER — Encounter (HOSPITAL_COMMUNITY)
Admission: RE | Admit: 2016-04-22 | Discharge: 2016-04-22 | Disposition: A | Discharge status: Home / Self Care | Payer: Self-pay | Source: Ambulatory Visit | Attending: Cardiology | Admitting: Cardiology

## 2016-04-22 ENCOUNTER — Other Ambulatory Visit: Payer: Self-pay | Admitting: Gastroenterology

## 2016-04-23 ENCOUNTER — Encounter (HOSPITAL_COMMUNITY)
Admission: RE | Admit: 2016-04-23 | Discharge: 2016-04-23 | Disposition: A | Discharge status: Home / Self Care | Payer: Self-pay | Source: Ambulatory Visit | Attending: Cardiology | Admitting: Cardiology

## 2016-04-25 ENCOUNTER — Encounter (HOSPITAL_COMMUNITY)
Admission: RE | Admit: 2016-04-25 | Discharge: 2016-04-25 | Disposition: A | Discharge status: Home / Self Care | Payer: Self-pay | Source: Ambulatory Visit | Attending: Cardiology | Admitting: Cardiology

## 2016-04-29 ENCOUNTER — Encounter (HOSPITAL_COMMUNITY): Payer: Self-pay

## 2016-04-30 ENCOUNTER — Encounter (HOSPITAL_COMMUNITY): Payer: Self-pay

## 2016-05-02 ENCOUNTER — Encounter (HOSPITAL_COMMUNITY)
Admission: RE | Admit: 2016-05-02 | Discharge: 2016-05-02 | Disposition: A | Discharge status: Home / Self Care | Payer: Self-pay | Source: Ambulatory Visit | Attending: Cardiology | Admitting: Cardiology

## 2016-05-07 ENCOUNTER — Encounter (HOSPITAL_COMMUNITY)
Admission: RE | Admit: 2016-05-07 | Discharge: 2016-05-07 | Disposition: A | Discharge status: Home / Self Care | Payer: Self-pay | Source: Ambulatory Visit | Attending: Cardiology | Admitting: Cardiology

## 2016-05-07 DIAGNOSIS — Z9861 Coronary angioplasty status: Secondary | ICD-10-CM | POA: Insufficient documentation

## 2016-05-09 ENCOUNTER — Encounter (HOSPITAL_COMMUNITY): Payer: Self-pay

## 2016-05-13 ENCOUNTER — Encounter (HOSPITAL_COMMUNITY)
Admission: RE | Admit: 2016-05-13 | Discharge: 2016-05-13 | Disposition: A | Discharge status: Home / Self Care | Payer: Self-pay | Source: Ambulatory Visit | Attending: Cardiology | Admitting: Cardiology

## 2016-05-14 ENCOUNTER — Encounter (HOSPITAL_COMMUNITY)
Admission: RE | Admit: 2016-05-14 | Discharge: 2016-05-14 | Disposition: A | Discharge status: Home / Self Care | Payer: Self-pay | Source: Ambulatory Visit | Attending: Cardiology | Admitting: Cardiology

## 2016-05-16 ENCOUNTER — Encounter (HOSPITAL_COMMUNITY)
Admission: RE | Admit: 2016-05-16 | Discharge: 2016-05-16 | Disposition: A | Discharge status: Home / Self Care | Payer: Self-pay | Source: Ambulatory Visit | Attending: Cardiology | Admitting: Cardiology

## 2016-05-20 ENCOUNTER — Encounter (HOSPITAL_COMMUNITY)
Admission: RE | Admit: 2016-05-20 | Discharge: 2016-05-20 | Disposition: A | Discharge status: Home / Self Care | Payer: Self-pay | Source: Ambulatory Visit | Attending: Cardiology | Admitting: Cardiology

## 2016-05-21 ENCOUNTER — Encounter (HOSPITAL_COMMUNITY)
Admission: RE | Admit: 2016-05-21 | Discharge: 2016-05-21 | Disposition: A | Discharge status: Home / Self Care | Payer: Self-pay | Source: Ambulatory Visit | Attending: Cardiology | Admitting: Cardiology

## 2016-05-23 ENCOUNTER — Encounter (HOSPITAL_COMMUNITY)
Admission: RE | Admit: 2016-05-23 | Discharge: 2016-05-23 | Disposition: A | Discharge status: Home / Self Care | Payer: Self-pay | Source: Ambulatory Visit | Attending: Cardiology | Admitting: Cardiology

## 2016-05-27 ENCOUNTER — Encounter (HOSPITAL_COMMUNITY): Payer: Self-pay

## 2016-05-28 ENCOUNTER — Encounter (HOSPITAL_COMMUNITY): Payer: Self-pay

## 2016-05-30 ENCOUNTER — Encounter (HOSPITAL_COMMUNITY): Payer: Self-pay

## 2016-06-03 ENCOUNTER — Encounter (HOSPITAL_COMMUNITY): Payer: Self-pay

## 2016-06-03 DIAGNOSIS — Z9861 Coronary angioplasty status: Secondary | ICD-10-CM | POA: Insufficient documentation

## 2016-06-04 ENCOUNTER — Encounter (HOSPITAL_COMMUNITY): Payer: Self-pay

## 2016-06-06 ENCOUNTER — Encounter (HOSPITAL_COMMUNITY)
Admission: RE | Admit: 2016-06-06 | Discharge: 2016-06-06 | Disposition: A | Discharge status: Home / Self Care | Payer: Self-pay | Source: Ambulatory Visit | Attending: Cardiology | Admitting: Cardiology

## 2016-06-10 ENCOUNTER — Ambulatory Visit (INDEPENDENT_AMBULATORY_CARE_PROVIDER_SITE_OTHER): Payer: Medicare Other | Admitting: Pulmonary Disease

## 2016-06-10 ENCOUNTER — Ambulatory Visit (INDEPENDENT_AMBULATORY_CARE_PROVIDER_SITE_OTHER)
Admission: RE | Admit: 2016-06-10 | Discharge: 2016-06-10 | Disposition: A | Discharge status: Home / Self Care | Payer: Medicare Other | Source: Ambulatory Visit | Attending: Pulmonary Disease | Admitting: Pulmonary Disease

## 2016-06-10 ENCOUNTER — Encounter (HOSPITAL_COMMUNITY): Payer: Self-pay

## 2016-06-10 ENCOUNTER — Encounter: Payer: Self-pay | Admitting: Pulmonary Disease

## 2016-06-10 VITALS — BP 140/88 | HR 78 | Temp 97.2°F | Wt 158.0 lb

## 2016-06-10 DIAGNOSIS — F17210 Nicotine dependence, cigarettes, uncomplicated: Secondary | ICD-10-CM | POA: Diagnosis not present

## 2016-06-10 DIAGNOSIS — J432 Centrilobular emphysema: Secondary | ICD-10-CM | POA: Diagnosis not present

## 2016-06-10 DIAGNOSIS — R938 Abnormal findings on diagnostic imaging of other specified body structures: Secondary | ICD-10-CM

## 2016-06-10 DIAGNOSIS — J449 Chronic obstructive pulmonary disease, unspecified: Secondary | ICD-10-CM

## 2016-06-10 DIAGNOSIS — R9389 Abnormal findings on diagnostic imaging of other specified body structures: Secondary | ICD-10-CM

## 2016-06-10 NOTE — Patient Instructions (Addendum)
Today we updated your med list in our EPIC system...    Continue your current medications the same...  Today we checked a follow up CXR & Spirometry breathing test... We will sched your 66yr follow up Low-dose screening CT chest...    We will contact you w/ the results when available...   Obviously you need to quit the smoking!!!    Let me know if you want to try Chantix, etc...  Continue w/ your TRUMPET exercises!  Call for any questions...  Let's plan a follow up visit in 38mo, sooner if needed for problems.Marland KitchenMarland Kitchen

## 2016-06-10 NOTE — Progress Notes (Signed)
Subjective:    Patient ID: Brad Turner Single, male    DOB: 1940-12-11, 75 y.o.   MRN: [id number]  HPI  ~  October 07, 2014:  1wk ROV w/ KC>      Pt saw DrMR  09/29/14 w/ COPD exac treated w/ Doxy & Pred...      Patient comes in today for worsening cough and classic description of cough syncope. His cough is high-pitched and barking in nature, and only produces white mucus. He does not feel congested, nor has he had increasing shortness of breath when not coughing. He has had episodes of syncope that are related to coughing episodes and paroxysms. Unfortunately, he continues to smoke, and is also on an ACE inhibitor.      PLAN>  The patient is describing classic cough syncope, and clearly his cough is upper airway in origin. It is a high-pitched barking cough and he has no purulence or congestion. His lungs are also totally clear to auscultation today. The patient has hoarseness and is describing a classic globus sensation. At this point, I think his cough is upper airway in origin and primarily related to cyclical coughing and his ACE inhibitor. Will discontinue this medication, and also work on cough suppression. I have also reviewed behavioral therapies to help with his globus sensation and urge to cough. Finally, I have stressed to him the importance of total smoking cessation.  ~  December 12, 2014:  18mo ROV w/ KC>        The patient comes in today for follow-up of his known COPD. He is continuing to smoke, and continues to have chronic dyspnea on exertion and excessive mucus production. However, he feels that his symptoms are within his usual baseline. He has finished up his drug study, and currently is not on an anti-cholinergic.      PLAN>  The patient feels that his breathing is near his usual baseline, and unfortunately continues to smoke. He is now off his prior drug study, and I would like to add an anti-cholinergic to his Symbicort to try and improve his dyspnea on exertion. I have also talked  with him again about total smoking cessation, and help this is the key to getting better.   CT Chest 09/17/13>  Clearing of prior RLL opac, 3 small nodules on the left- unchanged from 2011, severe emphysematous change, pos coronary calcif  Spirometry 11/22/13 at [facility]> FVC=3.26 (89%), FEV1=1.84 (69%), %1sec=56;  This is c/w a moderate obstructive ventilatory defect  V/Q Lung Scan 10/29/14>  Patchy nonsegmental ventilation defects, patchy scattered nonsegmental perfusion defects, felt to be a low prob scan...  CXR 10/29/14>  Heart is upper lim of norm, hyperinflated/ emphysematous lungs- NAD, DJD spine...  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     Cigarette smoker>  Still smoking 1.5ppd and he has smoked 1-1.5ppd for ~60 yrs; he declines smoking cessation help & is not motivated to quit...    Hx cough syncope> aware    CARDIAC issues> HBP, CAD, HL- followed by DrTilley (no notes in Epic)- on Plavix75, Amlod5-1/2, Diovan80, Lip80, [phone]; he remains in cardiac rehab regularly...    Dysphagia,  hx esoph stricture & Barrett's esoph> on Dexilant60; eval by DrKaplan w/ hx dilatation in the past    GU issues> on Myrbetriq50, Cialis5    Ortho- LBP w/ lumbar lam 2016 by DrDumonski    Anxiety/depression> on WellbutrinXL150; his son is treated for depression, friend died w/ metastatic lung cancer EXAM shows Afeb, VSS, O2sat=94% on RA;  HEENT- dental issues, mallampati2;  Chest- decr BS bilat w/o w/r/r;  Heart- RR w/o m/r/g;  Abd- soft, nontender, neg;  Ext- w/o c/c/e;  Neuro- intact...  Low dose screening CT Chest10/14/16>  Norm heart size, coronary & aortic calcif, 62mm noncalcif nodule in LUL,  scat calcif granulomas up to 2.83mm; mod centrilobular & paraseptal emphysema, abd is ok w/ 2cm cyst in liver...  IMP/PLAN>>  Brad Turner & I discussed his continued smoking & he is not motivated to quit- offered meds and he will call if he changes his mind;  Rec to continue Symbicort160-2spBid, he was tried on Spiriva respimat in past but wouldn't fill it due to cost- I pointed out that if he'd quit smoking he could get the Spiriva & noted that his breathing would be better due to both things! Rec to add Mucinex 2400mg /d.Marland KitchenMarland KitchenWe plan ROV 10months.  ~  December 07, 2015:  20mo ROV w/ SN>  PCP- DrTisovec, Cards- DrTilley, Gi- DrArmbruster...    Brad Turner has had some recent dental work w/ mult teeth pulled; he remains in cardiac rehab maintenance program and doing well; no new complaints or concerns; he is using his Symbicort160-2spBid, not using the Mucinex; he has chr stable DOE, min cough, small amt beige sput, no CP/ palpit/ edema...    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     Cigarette smoker>  Still smoking 1.5ppd and he has smoked 1-1.5ppd for ~60 yrs; he declines smoking cessation help & is not motivated to quit...    Hx cough syncope> aware    CARDIAC issues> HBP, CAD, HL- followed by DrTilley (no notes in Epic)- on Plavix75, Amlod5-1/2, Diovan80, Lip80, [phone]; he remains in cardiac rehab regularly... EXAM shows Afeb, VSS, O2sat=92% on RA;  HEENT- dental issues, mallampati2;  Chest- decr BS bilat w/o w/r/r;  Heart- RR w/o m/r/g;  Abd- soft, nontender, neg;  Ext- w/o c/c/e;  Neuro- intact... IMP/PLAN>>  Brad Turner is unable to quit smoking and has mod COPD w/ emphysema on his CTscans- we will add INCRUSE one inhalation daily; he knows to avoid infections & be sure he is up to date on all vaccinations from DrTisovec... we plan ROV 57mo at which time he will need his  next screening CT Chest...  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 He had a recent trip to [place] & did ok w/ walking & breathing but had some back discomfort that was more limiting; he saw DrDumonski recently w/ talk of rods and screws "but I'm too high risk"  and they are considering trial of nerve blocks... Of interest he noted that his 38 y/o son (adopted) had a baby daughter recently Dx w/ CF... 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     Pulm nodules on CT Chest> Yearly f/u low-dose screening CT is due 06/2016- mult tiny calcif nodules c/w old granulomatous dis + 4-11mm LUL nodule- stable...    Cigarette smoker>  Still smoking 1.5ppd and he has smoked 1-1.5ppd for ~60 yrs; he declines smoking cessation help & is not motivated to quit...    Hx cough syncope> past hx syncope w/ severe coughing paroxysms- aware    Cardiac issues> HBP, CAD, HL- followed by DrTilley (no notes in Epic, pt reports last seen 02/2016)- on Plavix75, Amlod5-1/2, Diovan80, Lip80, Tricor145; he remains in cardiac rehab regularly...    Medical issues> followed by DrTisovec- Hx dysphagia/ stricture/ Barrett's, LBP- s/p lumbar lam, anxiety/depression EXAM shows Afeb, VSS, O2sat=98% on RA;  HEENT-  dental issues, mallampati2;  Chest- decr BS bilat w/o w/r/r;  Heart- RR w/o m/r/g;  Abd- soft, nontender, neg;  Ext- w/o c/c/e;  Neuro- intact...  CXR 06/10/16>  Norm heart size, atherosclerotic Ao, no adenopathy, stable 60mm opac in LUL is noted- NAD, old trauma to left clavicle...   Low-dose screening CT Chest 06/18/16>  Norm heart size w/ atherosclerotic changes in Ao, great vessels, & coronaries; No adenopathy; Mult tiny pulm nodules bilat, most are calcified & c/w granulomas; Largest non-calcif nodule is 4-71mm in LUL & no change from 98yr ago- no new lesions; Diffuse bronchial wall thickening, & mod centrilobular & paraseptal emphysema; Small liver cyst & small bilat renal cysts noted...  Spirometry 06/10/16>  FVC=4.10 (112%), FEV1=2.00 (76%), %1sec=49%, mid-flows reduced at 32% predicted...  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    Past Medical History:  Diagnosis Date  . Allergy    seasonal  . Arthritis   . Barrett esophagus   . BPH (benign prostatic hyperplasia)   . CAD (coronary artery disease)   . Cataracts, bilateral   . COPD (chronic obstructive pulmonary disease) ([place])   . CVA (cerebral infarction)   . Depression   . Emphysema lung ([place])   . GERD (gastroesophageal reflux disease)   . History of kidney stones   . Hyperlipidemia   . Hypertension   . 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 Pneumonia    hx of  . Skin cancer   . Stroke [facility]) 1995   Affected balance; and has very emotional if watching a movie  . Urgency of urination    and frequency of urination at bedtime    Past  Surgical History:  Procedure Laterality Date  . ANGIOPLASTY     x4  . APPENDECTOMY    . CARDIAC CATHETERIZATION    .  cataracts Bilateral   . COLONOSCOPY    . CYSTOSCOPY  07/21/2013   Dr. Jeffie Pollock  . DENTAL SURGERY     with sedation  . ESOPHAGEAL MANOMETRY N/A 09/20/2015   Procedure: ESOPHAGEAL MANOMETRY (EM);  Surgeon: Manus Gunning, MD;  Location: [facility];  Service: Gastroenterology;  Laterality: N/A;  . LUMBAR LAMINECTOMY/DECOMPRESSION MICRODISCECTOMY N/A 01/12/2015   Procedure: LUMBAR LAMINECTOMY/DECOMPRESSION MICRODISCECTOMY 3 LEVELS;  Surgeon: Phylliss Bob, MD;  Location: [place];  Service: Orthopedics;  Laterality: N/A;  Lumbar 3-4, lumbar 4-5, lumbar 5-sacrum 1 decompression  . perithyroid    . POLYPECTOMY    . ROTATOR CUFF REPAIR     bilateral  . TONSILLECTOMY    . UPPER GASTROINTESTINAL ENDOSCOPY      Outpatient Encounter Prescriptions as of 06/10/2016  Medication Sig  . acetaminophen (TYLENOL) 325 MG tablet Take 325 mg by mouth every 6 (six) hours as needed for mild pain or moderate pain. Reported on 11/29/2015  . albuterol (PROVENTIL HFA;VENTOLIN HFA) 108 (90 BASE) MCG/ACT inhaler Inhale 2 puffs into the lungs every 6 (six) hours as needed for wheezing or shortness of breath. Reported on 11/29/2015  . atorvastatin (LIPITOR) 80 MG tablet Take 80 mg by mouth every morning.   . clopidogrel (PLAVIX) 75 MG tablet Take 1 tablet by mouth daily.  . fenofibrate (TRICOR) 145 MG tablet Take 145 mg by mouth every morning.   . folic acid (FOLVITE) Q000111Q MCG tablet Take 800 mcg by mouth daily.  Marland Kitchen gabapentin (NEURONTIN) 300 MG capsule Take 300 mg by mouth 3 (three) times daily.  . hydrochlorothiazide (HYDRODIURIL) 12.5 MG tablet Take 12.5 mg by mouth daily.  . hydroxypropyl methylcellulose / hypromellose (ISOPTO TEARS / GONIOVISC) 2.5 % ophthalmic solution Place 1 drop into both eyes as needed for dry eyes. Reported on 11/29/2015  . meloxicam (MOBIC) 15 MG tablet Take 15 mg by mouth  daily as needed for pain.  Marland Kitchen MYRBETRIQ 50 MG TB24 tablet Take 1 tablet by mouth daily.  Marland Kitchen NITROSTAT 0.4 MG SL tablet Place 0.4 mg under the tongue every 5 (five) minutes as needed for chest pain. Reported on 11/29/2015  . omeprazole (PRILOSEC) 40 MG capsule TAKE ONE CAPSULE BY MOUTH TWICE DAILY  . SYMBICORT 160-4.5 MCG/ACT inhaler INHALE TWO PUFFS INTO LUNGS TWICE DAILY  . tadalafil (CIALIS) 5 MG tablet Take 5 mg by mouth daily. For BPH  . valsartan (DIOVAN) 160 MG tablet Take 160 mg by mouth daily.  . [DISCONTINUED] alfuzosin (UROXATRAL) 10 MG 24 hr tablet   . [DISCONTINUED] amLODipine (NORVASC) 5 MG tablet Take 2.5 mg by mouth daily.    Allergies  Allergen Reactions  . Ivp Dye [Iodinated Diagnostic Agents] Hives and Shortness Of Breath  . Macrodantin [Nitrofurantoin] Other (See Comments)    Fever that lasted several months    Immunization History  Administered Date(s) Administered  . Influenza Split 05/14/2012  . Influenza Whole 06/02/2010, 05/09/2011  . Influenza,inj,Quad PF,36+ Mos 06/02/2013, 05/24/2014  . Influenza-Unspecified 05/31/2015  . Pneumococcal Polysaccharide-23 06/02/2009  . Tdap 10/29/2014    Current Medications, Allergies, Past Medical History, Past Surgical History, Family History, and Social History were reviewed in [employer] record.   Review of Systems  Constitutional: Negative for fever and unexpected weight change.  HENT: Positive for congestion and postnasal drip. Negative for dental problem, ear pain, nosebleeds, rhinorrhea, sinus pressure, sneezing, sore throat and trouble swallowing.   Eyes: Negative for redness and itching.  Respiratory: Positive for cough and shortness of  breath. Negative for chest tightness and wheezing.   Cardiovascular: Negative for chest pain, palpitations and leg swelling.  Gastrointestinal: Negative for nausea and vomiting.  Genitourinary: Negative for dysuria.  Musculoskeletal: Negative for joint  swelling.  Skin: Negative for rash.  Neurological: Negative for headaches.  Hematological: Does not bruise/bleed easily.  Psychiatric/Behavioral: Negative for dysphoric mood. The patient is not nervous/anxious.       Objective:   Physical Exam  Well-developed male in no acute distress Nose without purulence or discharge noted Neck without lymphadenopathy or thyromegaly Chest with decreased breath sounds, no wheezes or rhonchi Cardiac exam with regular rate and rhythm Lower extremities without edema, no cyanosis Alert and oriented, moves all 4 extremities.     Assessment & Plan:    IMP >>     COPD> on Symbicort160-2spBid, ProventilHFA prn; & he never filled the Incruse or Spiriva ($$)...    Pulm nodules on CT Chest> Yearly f/u low-dose screening CT is due 06/2016- mult tiny calcif nodules c/w old granulomatous dis + 4-57mm LUL nodule- stable...    Cigarette smoker>  Still smoking 1.5ppd and he has smoked 1-1.5ppd for ~60 yrs; he declines smoking cessation help & is not motivated to quit...    Hx cough syncope> aware    CARDIAC issues> HBP, CAD, HL- followed by DrTilley (no notes in Epic)- on Plavix75, Diovan80, Lip80, Tricor145; he remains in cardiac rehab regularly...    MEDICAL ISSUES>        Dysphagia, hx esoph stricture & Barrett's esoph> on Dexilant60; eval by DrKaplan w/ hx dilatation in the past       GU issues> on Myrbetriq50, Cialis5       Ortho- LBP w/ lumbar lam 2016 by DrDumonski       Anxiety/depression> on WellbutrinXL150; his son is treated for depression, friend died w/ metastatic lung cancer  PLAN >>   06/07/15>   Brad Turner & I discussed his continued smoking & he is not motivated to quit- offered meds and he will call if he changes his mind;  Rec to continue Symbicort160-2spBid, he was tried on Spiriva respimat in past but wouldn't fill it due to cost- I pointed out that if he'd quit smoking he could get the Spiriva & noted that his breathing would be better due to  both things! Rec to add Mucinex 2400mg /d.Marland KitchenMarland KitchenWe plan ROV 61months. 12/07/15>   Brad Turner is unable to quit smoking and has mod COPD w/ emphysema on his CTscans- we will add INCRUSE one inhalation daily; he knows to avoid infections & be sure he is up to date on all vaccinations from DrTisovec. 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 Reported on 11/29/2015   ALBUTEROL (PROVENTIL HFA;VENTOLIN HFA) 108 (90 BASE) MCG/ACT INHALER    Inhale 2 puffs into the lungs every 6 (six) hours as needed  for wheezing or shortness of breath. Reported on 11/29/2015   ATORVASTATIN (LIPITOR) 80 MG TABLET    Take 80 mg by mouth every morning.    CLOPIDOGREL (PLAVIX) 75 MG TABLET    Take 1 tablet by mouth daily.   FENOFIBRATE (TRICOR) 145 MG TABLET    Take 145 mg by mouth every morning.    FOLIC ACID (FOLVITE) Q000111Q MCG TABLET    Take 800 mcg by mouth daily.   GABAPENTIN (NEURONTIN) 300 MG CAPSULE    Take 300 mg by mouth 3 (three) times daily.   HYDROCHLOROTHIAZIDE (HYDRODIURIL) 12.5 MG TABLET    Take 12.5 mg by mouth daily.   HYDROXYPROPYL METHYLCELLULOSE / HYPROMELLOSE (ISOPTO TEARS / GONIOVISC) 2.5 % OPHTHALMIC SOLUTION    Place 1 drop into both eyes as needed for dry eyes. Reported on 11/29/2015   MELOXICAM (MOBIC) 15 MG TABLET    Take 15 mg by mouth daily as needed for pain.   MYRBETRIQ 50 MG TB24 TABLET    Take 1 tablet by mouth daily.    NITROSTAT 0.4 MG SL TABLET    Place 0.4 mg under the tongue every 5 (five) minutes as needed for chest pain. Reported on 11/29/2015   OMEPRAZOLE (PRILOSEC) 40 MG CAPSULE    TAKE ONE CAPSULE BY MOUTH TWICE DAILY   SYMBICORT 160-4.5 MCG/ACT INHALER    INHALE TWO PUFFS INTO LUNGS TWICE DAILY   TADALAFIL (CIALIS) 5 MG TABLET    Take 5 mg by mouth daily. For BPH   VALSARTAN (DIOVAN) 160 MG TABLET    Take 160 mg by mouth daily.  Modified Medications   No medications on file  Discontinued Medications   ALFUZOSIN (UROXATRAL) 10 MG 24 HR TABLET       AMLODIPINE (NORVASC) 5 MG TABLET    Take 2.5 mg by mouth daily.   VALSARTAN (DIOVAN) 80 MG TABLET    Take 1 tablet (80 mg total) by mouth daily.

## 2016-06-11 ENCOUNTER — Encounter (HOSPITAL_COMMUNITY)
Admission: RE | Admit: 2016-06-11 | Discharge: 2016-06-11 | Disposition: A | Discharge status: Home / Self Care | Payer: Self-pay | Source: Ambulatory Visit | Attending: Cardiology | Admitting: Cardiology

## 2016-06-13 ENCOUNTER — Encounter (HOSPITAL_COMMUNITY): Payer: Self-pay

## 2016-06-17 ENCOUNTER — Ambulatory Visit (INDEPENDENT_AMBULATORY_CARE_PROVIDER_SITE_OTHER)
Admission: RE | Admit: 2016-06-17 | Discharge: 2016-06-17 | Disposition: A | Discharge status: Home / Self Care | Payer: Medicare Other | Source: Ambulatory Visit | Attending: Pulmonary Disease | Admitting: Pulmonary Disease

## 2016-06-17 ENCOUNTER — Encounter (HOSPITAL_COMMUNITY): Payer: Self-pay

## 2016-06-17 DIAGNOSIS — R938 Abnormal findings on diagnostic imaging of other specified body structures: Secondary | ICD-10-CM

## 2016-06-17 DIAGNOSIS — J449 Chronic obstructive pulmonary disease, unspecified: Secondary | ICD-10-CM | POA: Diagnosis not present

## 2016-06-17 DIAGNOSIS — F17210 Nicotine dependence, cigarettes, uncomplicated: Secondary | ICD-10-CM

## 2016-06-17 DIAGNOSIS — R9389 Abnormal findings on diagnostic imaging of other specified body structures: Secondary | ICD-10-CM

## 2016-06-18 ENCOUNTER — Encounter (HOSPITAL_COMMUNITY): Payer: Self-pay

## 2016-06-20 ENCOUNTER — Encounter (HOSPITAL_COMMUNITY): Payer: Self-pay

## 2016-06-24 ENCOUNTER — Encounter (HOSPITAL_COMMUNITY): Payer: Self-pay

## 2016-06-25 ENCOUNTER — Encounter (HOSPITAL_COMMUNITY): Payer: Self-pay

## 2016-06-26 ENCOUNTER — Other Ambulatory Visit: Payer: Self-pay | Admitting: Physical Medicine and Rehabilitation

## 2016-06-26 DIAGNOSIS — M5416 Radiculopathy, lumbar region: Secondary | ICD-10-CM

## 2016-06-27 ENCOUNTER — Encounter (HOSPITAL_COMMUNITY): Payer: Self-pay

## 2016-07-01 ENCOUNTER — Encounter (HOSPITAL_COMMUNITY): Payer: Self-pay

## 2016-07-01 ENCOUNTER — Other Ambulatory Visit: Payer: Self-pay | Admitting: Physical Medicine and Rehabilitation

## 2016-07-01 ENCOUNTER — Ambulatory Visit
Admission: RE | Admit: 2016-07-01 | Discharge: 2016-07-01 | Disposition: A | Discharge status: Home / Self Care | Payer: Medicare Other | Source: Ambulatory Visit | Attending: Physical Medicine and Rehabilitation | Admitting: Physical Medicine and Rehabilitation

## 2016-07-01 DIAGNOSIS — M5416 Radiculopathy, lumbar region: Secondary | ICD-10-CM

## 2016-07-01 MED ORDER — MEPERIDINE HCL 100 MG/ML IJ SOLN
50.0000 mg | Freq: Once | INTRAMUSCULAR | Status: AC
  Administered 2016-07-01: 50 mg via INTRAMUSCULAR

## 2016-07-01 MED ORDER — METHYLPREDNISOLONE ACETATE 40 MG/ML INJ SUSP (RADIOLOG
120.0000 mg | Freq: Once | INTRAMUSCULAR | Status: AC
  Administered 2016-07-01: 120 mg via EPIDURAL

## 2016-07-01 MED ORDER — DIAZEPAM 5 MG PO TABS
5.0000 mg | ORAL_TABLET | Freq: Once | ORAL | Status: AC
  Administered 2016-07-01: 5 mg via ORAL

## 2016-07-01 MED ORDER — ONDANSETRON HCL 4 MG/2ML IJ SOLN
4.0000 mg | Freq: Once | INTRAMUSCULAR | Status: AC
  Administered 2016-07-01: 4 mg via INTRAMUSCULAR

## 2016-07-01 MED ORDER — IOPAMIDOL (ISOVUE-M 200) INJECTION 41%
1.0000 mL | Freq: Once | INTRAMUSCULAR | Status: AC
  Administered 2016-07-01: 1 mL via EPIDURAL

## 2016-07-01 NOTE — Discharge Instructions (Addendum)
Post Procedure Spinal Discharge Instruction Sheet  1. You may resume a regular diet and any medications that you routinely take (including pain medications).  2. No driving [place] of procedure.  3. Light activity throughout the rest of the [place].  Do not do any strenuous work, exercise, bending or lifting.  The [place] following the procedure, you can resume normal physical activity but you should refrain from exercising or physical therapy for at least three days thereafter.   Common Side Effects:   Headaches- take your usual medications as directed by your physician.  Increase your fluid intake.  Caffeinated beverages may be helpful.  Lie flat in bed until your headache resolves.   Restlessness or inability to sleep- you may have trouble sleeping for the next few days.  Ask your referring physician if you need any medication for sleep.   Facial flushing or redness- should subside within a few days.   Increased pain- a temporary increase in pain a [place] or two following your procedure is not unusual.  Take your pain medication as prescribed by your referring physician.   Leg cramps  Please contact our office at [phone] for the following symptoms:  Fever greater than 100 degrees.  Headaches unresolved with medication after 2-3 days.  Increased swelling, pain, or redness at injection site.  Thank you for visiting our office.  You may resume Plavix today.   

## 2016-07-02 ENCOUNTER — Encounter (HOSPITAL_COMMUNITY)
Admission: RE | Admit: 2016-07-02 | Discharge: 2016-07-02 | Disposition: A | Discharge status: Home / Self Care | Payer: Self-pay | Source: Ambulatory Visit | Attending: Cardiology | Admitting: Cardiology

## 2016-07-02 NOTE — Progress Notes (Signed)
Patient called this afternoon and informed us he is discharging from the Cardiac rehab Maintenance program due to Medical matters. This discharge will take effect 07/02/2016.

## 2016-07-04 ENCOUNTER — Other Ambulatory Visit: Payer: Medicare Other

## 2016-07-11 ENCOUNTER — Telehealth: Payer: Self-pay | Admitting: Radiology

## 2016-07-11 ENCOUNTER — Other Ambulatory Visit: Payer: Self-pay | Admitting: Physical Medicine and Rehabilitation

## 2016-07-11 DIAGNOSIS — M5416 Radiculopathy, lumbar region: Secondary | ICD-10-CM

## 2016-07-11 NOTE — Telephone Encounter (Signed)
13 hour prep called to [facility] on [address]. Called pt and explained prep to him

## 2016-07-19 ENCOUNTER — Ambulatory Visit
Admission: RE | Admit: 2016-07-19 | Discharge: 2016-07-19 | Disposition: A | Discharge status: Home / Self Care | Payer: Medicare Other | Source: Ambulatory Visit | Attending: Physical Medicine and Rehabilitation | Admitting: Physical Medicine and Rehabilitation

## 2016-07-19 ENCOUNTER — Other Ambulatory Visit: Payer: Self-pay | Admitting: Physical Medicine and Rehabilitation

## 2016-07-19 DIAGNOSIS — M5416 Radiculopathy, lumbar region: Secondary | ICD-10-CM

## 2016-07-19 MED ORDER — DIAZEPAM 5 MG PO TABS
5.0000 mg | ORAL_TABLET | Freq: Once | ORAL | Status: AC
  Administered 2016-07-19: 5 mg via ORAL

## 2016-07-19 MED ORDER — METHYLPREDNISOLONE ACETATE 40 MG/ML INJ SUSP (RADIOLOG
120.0000 mg | Freq: Once | INTRAMUSCULAR | Status: AC
  Administered 2016-07-19: 120 mg via EPIDURAL

## 2016-07-19 MED ORDER — IOPAMIDOL (ISOVUE-M 200) INJECTION 41%
10.0000 mL | Freq: Once | INTRAMUSCULAR | Status: AC
  Administered 2016-07-19: 10 mL via EPIDURAL

## 2016-07-19 NOTE — Discharge Instructions (Addendum)
Post Procedure Spinal Discharge Instruction Sheet  1. You may resume a regular diet and any medications that you routinely take (including pain medications).  2. No driving [place] of procedure.  3. Light activity throughout the rest of the [place].  Do not do any strenuous work, exercise, bending or lifting.  The [place] following the procedure, you can resume normal physical activity but you should refrain from exercising or physical therapy for at least three days thereafter.   Common Side Effects:   Headaches- take your usual medications as directed by your physician.  Increase your fluid intake.  Caffeinated beverages may be helpful.  Lie flat in bed until your headache resolves.   Restlessness or inability to sleep- you may have trouble sleeping for the next few days.  Ask your referring physician if you need any medication for sleep.   Facial flushing or redness- should subside within a few days.   Increased pain- a temporary increase in pain a [place] or two following your procedure is not unusual.  Take your pain medication as prescribed by your referring physician.   Leg cramps  Please contact our office at [phone] for the following symptoms:  Fever greater than 100 degrees.  Headaches unresolved with medication after 2-3 days.  Increased swelling, pain, or redness at injection site.  Thank you for visiting our office.  You may resume Plavix today.   

## 2016-08-02 ENCOUNTER — Encounter (HOSPITAL_COMMUNITY): Payer: Self-pay

## 2016-08-02 ENCOUNTER — Emergency Department (HOSPITAL_COMMUNITY)
Admission: EM | Admit: 2016-08-02 | Discharge: 2016-08-02 | Disposition: A | Discharge status: Home / Self Care | Payer: Medicare Other | Attending: Emergency Medicine | Admitting: Emergency Medicine

## 2016-08-02 ENCOUNTER — Emergency Department (HOSPITAL_COMMUNITY): Payer: Medicare Other

## 2016-08-02 DIAGNOSIS — I10 Essential (primary) hypertension: Secondary | ICD-10-CM | POA: Insufficient documentation

## 2016-08-02 DIAGNOSIS — R079 Chest pain, unspecified: Secondary | ICD-10-CM | POA: Diagnosis not present

## 2016-08-02 DIAGNOSIS — I2510 Atherosclerotic heart disease of native coronary artery without angina pectoris: Secondary | ICD-10-CM | POA: Diagnosis not present

## 2016-08-02 DIAGNOSIS — F17210 Nicotine dependence, cigarettes, uncomplicated: Secondary | ICD-10-CM | POA: Diagnosis not present

## 2016-08-02 DIAGNOSIS — J449 Chronic obstructive pulmonary disease, unspecified: Secondary | ICD-10-CM | POA: Insufficient documentation

## 2016-08-02 DIAGNOSIS — Z85828 Personal history of other malignant neoplasm of skin: Secondary | ICD-10-CM | POA: Insufficient documentation

## 2016-08-02 DIAGNOSIS — Z8673 Personal history of transient ischemic attack (TIA), and cerebral infarction without residual deficits: Secondary | ICD-10-CM | POA: Insufficient documentation

## 2016-08-02 DIAGNOSIS — Z7982 Long term (current) use of aspirin: Secondary | ICD-10-CM | POA: Diagnosis not present

## 2016-08-02 LAB — CBC
HEMATOCRIT: 38.4 % — AB (ref 39.0–52.0)
Hemoglobin: 13 g/dL (ref 13.0–17.0)
MCH: 32.5 pg (ref 26.0–34.0)
MCHC: 33.9 g/dL (ref 30.0–36.0)
MCV: 96 fL (ref 78.0–100.0)
PLATELETS: 363 10*3/uL (ref 150–400)
RBC: 4 MIL/uL — ABNORMAL LOW (ref 4.22–5.81)
RDW: 15.2 % (ref 11.5–15.5)
WBC: 7 10*3/uL (ref 4.0–10.5)

## 2016-08-02 LAB — BASIC METABOLIC PANEL
Anion gap: 10 (ref 5–15)
BUN: 38 mg/dL — AB (ref 6–20)
CO2: 24 mmol/L (ref 22–32)
CREATININE: 2.04 mg/dL — AB (ref 0.61–1.24)
Calcium: 8.7 mg/dL — ABNORMAL LOW (ref 8.9–10.3)
Chloride: 98 mmol/L — ABNORMAL LOW (ref 101–111)
GFR calc Af Amer: 36 mL/min — ABNORMAL LOW (ref >60)
GFR, EST NON AFRICAN AMERICAN: 31 mL/min — AB (ref >60)
GLUCOSE: 96 mg/dL (ref 65–99)
POTASSIUM: 4 mmol/L (ref 3.5–5.1)
SODIUM: 132 mmol/L — AB (ref 135–145)

## 2016-08-02 LAB — I-STAT TROPONIN, ED: Troponin i, poc: 0 ng/mL (ref 0.00–0.08)

## 2016-08-02 NOTE — ED Notes (Signed)
Pt states he is having high blood pressure rt not having his meds this morning.

## 2016-08-02 NOTE — ED Notes (Signed)
Patient transported to X-ray 

## 2016-08-02 NOTE — ED Notes (Signed)
Pt is in stable condition upon d/c and ambulates from ED. 

## 2016-08-02 NOTE — ED Triage Notes (Signed)
Pt. Arrived via ambulance with resolved chest pain from [facility] Urgent care.  Pt. Is scheduled for back surgery in 2 weeks  And  Developed chest pressure this morning.  Pt. Believes that the pressure is from being constipated due to starting Lyrica for his back pain.  Pt. Took 1 Nitro en route to Urgent Care but the pain was resolved /  AT the urgent care they gave him another nitro and called EMS.  Pt. Reports the pressure is 1/10, Pt. Denies any n/v or sob.  Pt does have a hx of angioplasty.  ECG done in triage.  Pt. Is alert and oriented X4,.  Pt. Received 324mg  ASA in route and has an IV present.

## 2016-08-02 NOTE — ED Provider Notes (Signed)
[facility] DEPT Provider Note   CSN: [id number] Arrival date & time: 08/02/16  1059     History   Chief Complaint Chief Complaint  Patient presents with  . Chest Pain    HPI Brad Turner is a 75 y.o. male.   25     Patient presents with chest pain. It is dull and in the mid chest. Thinks it is from his constipation from being on Lyrica. Took nitroglycerin pain improved. He also get another nitroglycerin at urgent care after he had no pain. No pain now. Has a history of angioplasty. Previous heart attack and it does feel little like that. His been doing fine otherwise. Does have chronic back pain. No fevers. No shortness of breath. Dr. Wynonia Lawman is his cardiologist. It was dull and in his mid chest. No diaphoresis. Past Medical History:  Diagnosis Date  . Allergy    seasonal  . Arthritis   . Barrett esophagus   . BPH (benign prostatic hyperplasia)   . CAD (coronary artery disease)   . Cataracts, bilateral   . COPD (chronic obstructive pulmonary disease) ([place])   . CVA (cerebral infarction)   . Depression   . Emphysema lung ([place])   . GERD (gastroesophageal reflux disease)   . History of kidney stones   . Hyperlipidemia   . Hypertension   . 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 Pneumonia    hx of  . Skin cancer   . Stroke [facility]) 1995   Affected balance; and has very emotional if watching a movie  . Urgency of urination    and frequency of urination at bedtime    Patient Active Problem List   Diagnosis Date Noted  . COPD mixed type ([place]) 06/10/2016  . Abnormal CT of the chest 06/10/2016  . Dysphagia   . Cigarette smoker 06/07/2015  . Essential hypertension, benign 10/30/2014    Class: Chronic  . Syncope 10/29/2014  . Cough syncope 10/07/2014  . COPD exacerbation ([place]) 09/29/2014  . Pulmonary nodules 11/22/2013  . Chest pain, unspecified 10/15/2013  . Personal history of colonic polyps 06/04/2010  . COPD (chronic obstructive pulmonary disease)  with emphysema ([place]) 02/26/2010  . CHEST PAIN, ATYPICAL 02/26/2010  . BARRETTS ESOPHAGUS 05/18/2009  . HYPERLIPIDEMIA 04/28/2008  . Coronary atherosclerosis 04/28/2008  . CVA 04/28/2008  . GERD 04/28/2008  . ARTHRITIS 04/28/2008  . NEPHROLITHIASIS, HX OF 04/28/2008    Past Surgical History:  Procedure Laterality Date  . ANGIOPLASTY     x4  . APPENDECTOMY    . CARDIAC CATHETERIZATION    . cataracts Bilateral   . COLONOSCOPY    . CYSTOSCOPY  07/21/2013   Dr. Jeffie Pollock  . DENTAL SURGERY     with sedation  . ESOPHAGEAL MANOMETRY N/A 09/20/2015   Procedure: ESOPHAGEAL MANOMETRY (EM);  Surgeon: Manus Gunning, MD;  Location: [facility];  Service: Gastroenterology;  Laterality: N/A;  . LUMBAR LAMINECTOMY/DECOMPRESSION MICRODISCECTOMY N/A 01/12/2015   Procedure: LUMBAR LAMINECTOMY/DECOMPRESSION MICRODISCECTOMY 3 LEVELS;  Surgeon: Phylliss Bob, MD;  Location: [facility];  Service: Orthopedics;  Laterality: N/A;  Lumbar 3-4, lumbar 4-5, lumbar 5-sacrum 1 decompression  . perithyroid    . POLYPECTOMY    . ROTATOR CUFF REPAIR     bilateral  . TONSILLECTOMY    . UPPER GASTROINTESTINAL ENDOSCOPY         Home Medications    Prior to Admission medications   Medication Sig Start Date End Date Taking? Authorizing Provider  acetaminophen (TYLENOL) 325 MG tablet Take 325 mg by mouth every 6 (six) hours as needed for mild pain or moderate pain. Reported on 11/29/2015    Historical Provider, MD  albuterol (PROVENTIL HFA;VENTOLIN HFA) 108 (90 BASE) MCG/ACT inhaler Inhale 2 puffs into the lungs every 6 (six) hours as needed for wheezing or shortness of breath. Reported on 11/29/2015    Historical Provider, MD  aspirin 81 MG chewable tablet Chew 81 mg by mouth daily.    Historical Provider, MD  atorvastatin (LIPITOR) 80 MG tablet Take 80 mg by mouth every morning.     Historical Provider, MD  clopidogrel (PLAVIX) 75 MG tablet Take 1 tablet by mouth daily. 03/14/15   Historical Provider, MD    fenofibrate (TRICOR) 145 MG tablet Take 145 mg by mouth every morning.     Historical Provider, MD  folic acid (FOLVITE) Q000111Q MCG tablet Take 800 mcg by mouth daily.    Historical Provider, MD  gabapentin (NEURONTIN) 300 MG capsule Take 300 mg by mouth 3 (three) times daily.    Historical Provider, MD  hydrochlorothiazide (HYDRODIURIL) 12.5 MG tablet Take 12.5 mg by mouth daily.    Historical Provider, MD  Lutein 20 MG TABS Take 20 mg by mouth.    Historical Provider, MD  meloxicam (MOBIC) 15 MG tablet Take 15 mg by mouth daily as needed for pain.    Historical Provider, MD  MYRBETRIQ 50 MG TB24 tablet Take 1 tablet by mouth daily. 05/03/15   Historical Provider, MD  NITROSTAT 0.4 MG SL tablet Place 0.4 mg under the tongue every 5 (five) minutes as needed for chest pain. Reported on 11/29/2015 05/29/13   Historical Provider, MD  omeprazole (PRILOSEC) 40 MG capsule TAKE ONE CAPSULE BY MOUTH TWICE DAILY 04/22/16   Manus Gunning, MD  SYMBICORT 160-4.5 MCG/ACT inhaler INHALE TWO PUFFS INTO LUNGS TWICE DAILY 04/19/14   Kathee Delton, MD  tadalafil (CIALIS) 5 MG tablet Take 5 mg by mouth daily. For BPH    Historical Provider, MD  valsartan (DIOVAN) 160 MG tablet Take 160 mg by mouth daily.    Historical Provider, MD    Family History Family History  Problem Relation Age of Onset  . Stroke Mother   . Rheum arthritis Mother   . Heart disease Father   . Colon cancer      ? mat. uncle died age 29  . Colon polyps Neg Hx   . Esophageal cancer Neg Hx   . Rectal cancer Neg Hx   . Stomach cancer Neg Hx     Social History Social History  Substance Use Topics  . Smoking status: Current Every [place] Smoker    Packs/[place]or[place]: 1.50    Years: 58.00    Types: Cigarettes  . Smokeless tobacco: Never Used     Comment: Tobaccco info given 10/15/13  . Alcohol use 8.4 oz/week    14 Standard drinks or equivalent per week     Comment: every [place], vodka- 1-2 a [place]      Allergies   Ivp dye [iodinated  diagnostic agents] and Macrodantin [nitrofurantoin]   Review of Systems Review of Systems  Constitutional: Negative for activity change and appetite change.  HENT: Negative for congestion.   Eyes: Negative for pain.  Respiratory: Negative for chest tightness and shortness of breath.   Cardiovascular: Positive for chest pain. Negative for leg swelling.  Gastrointestinal: Positive for constipation. Negative for abdominal pain, diarrhea, nausea and vomiting.  Genitourinary: Negative for flank  pain.  Musculoskeletal: Negative for back pain and neck stiffness.  Skin: Negative for rash.  Neurological: Negative for weakness, numbness and headaches.  Psychiatric/Behavioral: Negative for behavioral problems.     Physical Exam Updated Vital Signs BP 135/94   Pulse 82   Temp 97.4 F (36.3 C) (Oral)   Resp 16   Ht 5\' 6"  (1.676 m)   Wt 158 lb (71.7 kg)   SpO2 97%   BMI 25.50 kg/m   Physical Exam  Constitutional: He appears well-developed.  HENT:  Head: Atraumatic.  Eyes: EOM are normal.  Neck: Neck supple.  Cardiovascular: Normal rate.   Pulmonary/Chest: Effort normal.  Abdominal: Soft. There is no tenderness.  Musculoskeletal: Normal range of motion.  Neurological: He is alert.  Skin: Skin is warm.  Psychiatric: He has a normal mood and affect.     ED Treatments / Results  Labs (all labs ordered are listed, but only abnormal results are displayed) Labs Reviewed  BASIC METABOLIC PANEL - Abnormal; Notable for the following:       Result Value   Sodium 132 (*)    Chloride 98 (*)    BUN 38 (*)    Creatinine, Ser 2.04 (*)    Calcium 8.7 (*)    GFR calc non Af Amer 31 (*)    GFR calc Af Amer 36 (*)    All other components within normal limits  CBC - Abnormal; Notable for the following:    RBC 4.00 (*)    HCT 38.4 (*)    All other components within normal limits  I-STAT TROPOININ, ED    EKG  EKG Interpretation  Date/Time:  Friday August 02 2016 11:11:32  EST Ventricular Rate:  75 PR Interval:  150 QRS Duration: 86 QT Interval:  372 QTC Calculation: 415 R Axis:   31 Text Interpretation:  Sinus rhythm with occasional Premature ventricular complexes Otherwise normal ECG Confirmed by Alvino Chapel  MD, Ryhanna Dunsmore [phone]) on 08/02/2016 12:16:03 PM       Radiology Dg Chest 2 View  Result Date: 08/02/2016 CLINICAL DATA:  Chest pressure EXAM: CHEST  2 VIEW COMPARISON:  June 10, 2016 chest radiograph and chest CT June 17, 2016 FINDINGS: There is no edema or consolidation. Heart size and pulmonary vascularity are normal. No adenopathy. There is atherosclerotic calcification in the aorta. There are surgical clips in the midline upper chest region slightly to the left of midline. There is evidence of old trauma involving the lateral left clavicle. IMPRESSION: No edema or consolidation.  There is aortic atherosclerosis. Electronically Signed   By: Lowella Grip III M.D.   On: 08/02/2016 12:54    Procedures Procedures (including critical care time)  Medications Ordered in ED Medications - No data to display   Initial Impression / Assessment and Plan / ED Course  I have reviewed the triage vital signs and the nursing notes.  Pertinent labs & imaging results that were available during my care of the patient were reviewed by me and considered in my medical decision making (see chart for details).  Clinical Course     Patient with chest pain. Enzymes negative. EKG reassuring however patient states it did feel somewhat like previous angina. Feels like it's more reflux though. Enzymes negative 1 but patient not willing to stay for a second site or for admission. Discharged AMA. Patient's cardiologist is Dr. Wynonia Lawman.  Final Clinical Impressions(s) / ED Diagnoses   Final diagnoses:  Nonspecific chest pain    New Prescriptions Discharge  Medication List as of 08/02/2016  2:28 PM       Davonna Belling, MD 08/02/16 [phone]

## 2016-09-17 ENCOUNTER — Other Ambulatory Visit: Payer: Self-pay | Admitting: Orthopedic Surgery

## 2016-10-08 ENCOUNTER — Telehealth: Payer: Self-pay | Admitting: Pulmonary Disease

## 2016-10-08 NOTE — Pre-Procedure Instructions (Signed)
Brad Turner  10/08/2016      [name] [address] [place] [id number] [name] [name] [address] Phone: [phone] Fax: [phone]    Your procedure is scheduled on Thurs, Feb 15 @ 7:30 AM  Report to [facility] Admitting at 5:30 AM  Call this number if you have problems the morning of surgery:  [phone]   Remember:  Do not eat food or drink liquids after midnight.  Take these medicines the morning of surgery with A SIP OF WATER Albuterol<Bring Your Inhaler With You>,Pain Pill(if needed),Lyrica(Pregabalin),Myrbetriq,Symbicort,and Omeprazole(Prilosec)              Stop taking your Plavix and Aspirin along with any Vitamins or Herbal Medications. No Goody's,BC's,Aleve,Advil,Motrin,Ibuprofen,or Fish Oil.      Do not wear jewelry, make-up or nail polish.  Do not wear lotions, powders,colognes, or deoderant.  Men may shave face and neck.  Do not bring valuables to the hospital.  [name] is not responsible for any belongings or valuables.  Contacts, dentures or bridgework may not be worn into surgery.  Leave your suitcase in the car.  After surgery it may be brought to your room.  For patients admitted to the hospital, discharge time will be determined by your treatment team.  Patients discharged the [place] of surgery will not be allowed to drive home.    [facility]  Before surgery, you can play an important role.  Because skin is not sterile, your skin needs to be as free of germs as possible.  You can reduce the number of germs on you skin by washing with CHG (chlorahexidine gluconate) soap before surgery.  CHG is an antiseptic cleaner which kills germs and bonds with the skin to continue killing germs even after washing.  Please DO NOT use if you have an allergy to CHG or antibacterial soaps.  If your skin becomes reddened/irritated stop using the CHG and inform your  nurse when you arrive at Short Stay.  Do not shave (including legs and underarms) for at least 48 hours prior to the first CHG shower.  You may shave your face.  Please follow these instructions carefully:   1.  Shower with CHG Soap the night before surgery and the                                morning of Surgery.  2.  If you choose to wash your hair, wash your hair first as usual with your       normal shampoo.  3.  After you shampoo, rinse your hair and body thoroughly to remove the                      Shampoo.  4.  Use CHG as you would any other liquid soap.  You can apply chg directly       to the skin and wash gently with scrungie or a clean washcloth.  5.  Apply the CHG Soap to your body ONLY FROM THE NECK DOWN.        Do not use on open wounds or open sores.  Avoid contact with your eyes,       ears, mouth and genitals (private parts).  Wash genitals (private parts)       with your normal soap.  6.  Wash thoroughly,  paying special attention to the area where your surgery        will be performed.  7.  Thoroughly rinse your body with warm water from the neck down.  8.  DO NOT shower/wash with your normal soap after using and rinsing off       the CHG Soap.  9.  Pat yourself dry with a clean towel.            10.  Wear clean pajamas.            11.  Place clean sheets on your bed the night of your first shower and do not        sleep with pets.  [name] of Surgery  Do not apply any lotions/deoderants the morning of surgery.  Please wear clean clothes to the hospital/surgery center.    Please read over the following fact sheets that you were given. Pain Booklet, MRSA Information and Surgical Site Infection Prevention

## 2016-10-08 NOTE — Telephone Encounter (Signed)
Called and spoke to pt. Pt states he had an episode of syncope on 2.4.18 and hit his head that resulted in a laceration on forehead and two black eyes. Pt states he never was medically evaluated after hitting head. Pt states he doesn't remember if he was coughing at the time he passed out. Pt c/o chest congestion and prod cough with clear mucus x 1 -2 months. Pt denies SOB and CP/tightness and f/c/s. Pt states he has not taken the Symbicort x 1 week, pt states he just got this refilled on 2.7.18 and will pick up the med today. Pt states he has upcoming surgery regarding his leg weakness. Pt states he is unsure if he fell from his legs giving out and then hit his head and lost consciousness or if he lost consciousness and then hit his head. Pt states his memory of the event is poor and thinks he might of had a concussion. Pt called his PCP, Dr. Osborne Casco, but was then advised to contact us. Pt is requesting a call back today 10/08/16   Dr. Lenna Gilford please advise. Thanks.   Allergies  Allergen Reactions  . Ivp Dye [Iodinated Diagnostic Agents] Hives and Shortness Of Breath    reaction was when pt was 76 years old.  Clancy Gourd [Nitrofurantoin] Other (See Comments)    Fever that lasted several months    Current Outpatient Prescriptions on File Prior to Visit  Medication Sig Dispense Refill  . acetaminophen (TYLENOL) 500 MG tablet Take 1,000 mg by mouth every 6 (six) hours as needed (for back pain.).    Marland Kitchen albuterol (PROVENTIL HFA;VENTOLIN HFA) 108 (90 BASE) MCG/ACT inhaler Inhale 2 puffs into the lungs every 6 (six) hours as needed for wheezing or shortness of breath. Reported on 11/29/2015    . aspirin EC 81 MG tablet Take 81 mg by mouth daily.    Marland Kitchen atorvastatin (LIPITOR) 80 MG tablet Take 80 mg by mouth daily.     . clopidogrel (PLAVIX) 75 MG tablet Take 75 mg by mouth daily.     . fenofibrate (TRICOR) 145 MG tablet Take 145 mg by mouth daily.     . folic acid (FOLVITE) [id number] MCG tablet Take 400 mcg by  mouth daily.    . hydrochlorothiazide (HYDRODIURIL) 12.5 MG tablet Take 12.5 mg by mouth daily.    Marland Kitchen HYDROcodone-acetaminophen (NORCO/VICODIN) 5-325 MG tablet Take 0.5-1 tablets by mouth 2 (two) times daily as needed for severe pain.    Marland Kitchen LYRICA 75 MG capsule Take 75 mg by mouth daily.    Marland Kitchen MYRBETRIQ 50 MG TB24 tablet Take 50 mg by mouth daily.     Marland Kitchen NITROSTAT 0.4 MG SL tablet Place 0.4 mg under the tongue every 5 (five) minutes as needed for chest pain. Reported on 11/29/2015    . omeprazole (PRILOSEC) 40 MG capsule TAKE ONE CAPSULE BY MOUTH TWICE DAILY 180 capsule 1  . SYMBICORT 160-4.5 MCG/ACT inhaler INHALE TWO PUFFS INTO LUNGS TWICE DAILY 1 Inhaler 6  . tadalafil (CIALIS) 5 MG tablet Take 5 mg by mouth daily. For BPH    . valsartan (DIOVAN) 160 MG tablet Take 160 mg by mouth daily.     No current facility-administered medications on file prior to visit.

## 2016-10-08 NOTE — Telephone Encounter (Signed)
Per SN >> Pt should have gone to the ED for this head injury. Since he didn't, his PCP MUST take care of this. He needs to be evaluated for a possible neurologic injury. Pt's PCP will not put this off on me. I am happy to see him for follow up of his COPD/Emphysema, but nothing else.  Spoke with pt. He is aware of SN's response. States that he would like to make an appointment with SN to discuss his congestion. OV has been scheduled for 10/09/16 at 11am. Nothing further was needed.

## 2016-10-09 ENCOUNTER — Encounter: Payer: Self-pay | Admitting: Pulmonary Disease

## 2016-10-09 ENCOUNTER — Encounter (HOSPITAL_COMMUNITY)
Admission: RE | Admit: 2016-10-09 | Discharge: 2016-10-09 | Disposition: A | Discharge status: Home / Self Care | Payer: Medicare Other | Source: Ambulatory Visit | Attending: Orthopedic Surgery | Admitting: Orthopedic Surgery

## 2016-10-09 ENCOUNTER — Encounter (HOSPITAL_COMMUNITY): Payer: Self-pay

## 2016-10-09 ENCOUNTER — Ambulatory Visit (INDEPENDENT_AMBULATORY_CARE_PROVIDER_SITE_OTHER): Payer: Medicare Other | Admitting: Pulmonary Disease

## 2016-10-09 VITALS — BP 118/76 | HR 86 | Temp 97.8°F | Ht 66.0 in | Wt 158.0 lb

## 2016-10-09 DIAGNOSIS — Z01812 Encounter for preprocedural laboratory examination: Secondary | ICD-10-CM | POA: Insufficient documentation

## 2016-10-09 DIAGNOSIS — M5416 Radiculopathy, lumbar region: Secondary | ICD-10-CM | POA: Diagnosis not present

## 2016-10-09 DIAGNOSIS — J432 Centrilobular emphysema: Secondary | ICD-10-CM

## 2016-10-09 DIAGNOSIS — J441 Chronic obstructive pulmonary disease with (acute) exacerbation: Secondary | ICD-10-CM | POA: Diagnosis not present

## 2016-10-09 DIAGNOSIS — R9389 Abnormal findings on diagnostic imaging of other specified body structures: Secondary | ICD-10-CM

## 2016-10-09 DIAGNOSIS — M5417 Radiculopathy, lumbosacral region: Secondary | ICD-10-CM | POA: Insufficient documentation

## 2016-10-09 DIAGNOSIS — J449 Chronic obstructive pulmonary disease, unspecified: Secondary | ICD-10-CM

## 2016-10-09 DIAGNOSIS — R938 Abnormal findings on diagnostic imaging of other specified body structures: Secondary | ICD-10-CM

## 2016-10-09 DIAGNOSIS — F17210 Nicotine dependence, cigarettes, uncomplicated: Secondary | ICD-10-CM

## 2016-10-09 DIAGNOSIS — R55 Syncope and collapse: Secondary | ICD-10-CM

## 2016-10-09 HISTORY — DX: Personal history of colon polyps, unspecified: Z86.0100

## 2016-10-09 HISTORY — DX: Other chronic pain: G89.29

## 2016-10-09 HISTORY — DX: Other specified postprocedural states: Z98.890

## 2016-10-09 HISTORY — DX: Nocturia: R35.1

## 2016-10-09 HISTORY — DX: Dorsalgia, unspecified: M54.9

## 2016-10-09 HISTORY — DX: Polyneuropathy, unspecified: G62.9

## 2016-10-09 HISTORY — DX: Nausea with vomiting, unspecified: R11.2

## 2016-10-09 HISTORY — DX: Unspecified macular degeneration: H35.30

## 2016-10-09 HISTORY — DX: Personal history of colonic polyps: Z86.010

## 2016-10-09 HISTORY — DX: Syncope and collapse: R55

## 2016-10-09 HISTORY — DX: Unspecified fall, initial encounter: W19.XXXA

## 2016-10-09 LAB — URINALYSIS, ROUTINE W REFLEX MICROSCOPIC
Glucose, UA: NEGATIVE mg/dL
Hgb urine dipstick: NEGATIVE
Ketones, ur: 5 mg/dL — AB
Leukocytes, UA: NEGATIVE
Nitrite: NEGATIVE
Protein, ur: NEGATIVE mg/dL
Specific Gravity, Urine: 1.027 (ref 1.005–1.030)
pH: 5 (ref 5.0–8.0)

## 2016-10-09 LAB — TYPE AND SCREEN
ABO/RH(D): O POS
Antibody Screen: NEGATIVE

## 2016-10-09 LAB — CBC WITH DIFFERENTIAL/PLATELET
BASOS ABS: 0 10*3/uL (ref 0.0–0.1)
Basophils Relative: 0 %
EOS PCT: 1 %
Eosinophils Absolute: 0.1 10*3/uL (ref 0.0–0.7)
HCT: 41 % (ref 39.0–52.0)
HEMOGLOBIN: 13.9 g/dL (ref 13.0–17.0)
Lymphocytes Relative: 23 %
Lymphs Abs: 2.2 10*3/uL (ref 0.7–4.0)
MCH: 32.9 pg (ref 26.0–34.0)
MCHC: 33.9 g/dL (ref 30.0–36.0)
MCV: 97.2 fL (ref 78.0–100.0)
Monocytes Absolute: 0.7 10*3/uL (ref 0.1–1.0)
Monocytes Relative: 8 %
NEUTROS PCT: 68 %
Neutro Abs: 6.6 10*3/uL (ref 1.7–7.7)
PLATELETS: 441 10*3/uL — AB (ref 150–400)
RBC: 4.22 MIL/uL (ref 4.22–5.81)
RDW: 13.7 % (ref 11.5–15.5)
WBC: 9.6 10*3/uL (ref 4.0–10.5)

## 2016-10-09 LAB — COMPREHENSIVE METABOLIC PANEL
ALK PHOS: 72 U/L (ref 38–126)
ALT: 35 U/L (ref 17–63)
AST: 59 U/L — AB (ref 15–41)
Albumin: 4.2 g/dL (ref 3.5–5.0)
Anion gap: 13 (ref 5–15)
BUN: 19 mg/dL (ref 6–20)
CALCIUM: 9.3 mg/dL (ref 8.9–10.3)
CHLORIDE: 93 mmol/L — AB (ref 101–111)
CO2: 29 mmol/L (ref 22–32)
Creatinine, Ser: 1.44 mg/dL — ABNORMAL HIGH (ref 0.61–1.24)
GFR calc Af Amer: 54 mL/min — ABNORMAL LOW (ref >60)
GFR, EST NON AFRICAN AMERICAN: 47 mL/min — AB (ref >60)
Glucose, Bld: 94 mg/dL (ref 65–99)
Potassium: 3.2 mmol/L — ABNORMAL LOW (ref 3.5–5.1)
SODIUM: 135 mmol/L (ref 135–145)
Total Bilirubin: 1.6 mg/dL — ABNORMAL HIGH (ref 0.3–1.2)
Total Protein: 7.5 g/dL (ref 6.5–8.1)

## 2016-10-09 LAB — ABO/RH: ABO/RH(D): O POS

## 2016-10-09 LAB — SURGICAL PCR SCREEN
MRSA, PCR: NEGATIVE
Staphylococcus aureus: NEGATIVE

## 2016-10-09 LAB — PROTIME-INR
INR: 1.02
PROTHROMBIN TIME: 13.4 s (ref 11.4–15.2)

## 2016-10-09 LAB — APTT: APTT: 27 s (ref 24–36)

## 2016-10-09 MED ORDER — PREDNISONE 20 MG PO TABS: ORAL_TABLET | ORAL | 0 refills | Status: DC

## 2016-10-09 MED ORDER — METHYLPREDNISOLONE ACETATE 80 MG/ML IJ SUSP
80.0000 mg | Freq: Once | INTRAMUSCULAR | Status: AC
  Administered 2016-10-09: 80 mg via INTRAMUSCULAR

## 2016-10-09 MED ORDER — POVIDONE-IODINE 7.5 % EX SOLN
Freq: Once | CUTANEOUS | Status: DC
  Filled 2016-10-09: qty 118

## 2016-10-09 NOTE — Progress Notes (Addendum)
Cardiologist is Dr.Spencer Wynonia Lawman with last visit a month ago  Echo done several yrs ago  Stress test done about a month ago   Heart cath in 2011  EKG and CXR in epic from 08-02-16  More recent CXR done in Jan d/t uri,treated with Prednisone,Z Pak,and Mucinex  Dr.Nadel is Pulmonologist with visit today for 11

## 2016-10-09 NOTE — Progress Notes (Signed)
Subjective:    Patient ID: Brad Turner, male    DOB: 1941-03-20, 76 y.o.   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    CT Chest 09/17/13>  Clearing of prior RLL opac, 3 small nodules on the left- unchanged from 2011, severe emphysematous change, pos coronary calcif  Spirometry 11/22/13 at [facility]> FVC=3.26 (89%), FEV1=1.84 (69%), %1sec=56;  This is c/w a moderate obstructive ventilatory defect  V/Q Lung Scan 10/29/14>  Patchy nonsegmental ventilation defects, patchy scattered nonsegmental perfusion defects, felt to be a low prob scan...  CXR 10/29/14>  Heart is upper lim of norm, hyperinflated/ emphysematous lungs- NAD, DJD spine...  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     Cigarette smoker>  Still smoking 1.5ppd and he has smoked 1-1.5ppd for ~60 yrs; he declines smoking cessation help & is not motivated to quit...    Hx cough syncope> aware    CARDIAC issues> HBP, CAD, HL- followed by DrTilley (no notes in Epic)- on Plavix75, Amlod5-1/2, Diovan80, Lip80, [phone]; he remains in cardiac rehab regularly...    Dysphagia, hx esoph stricture & Barrett's esoph> on Dexilant60; eval by DrKaplan w/ hx dilatation in the past    GU issues> on Myrbetriq50, Cialis5    Ortho- LBP w/ lumbar lam 2016 by DrDumonski    Anxiety/depression> on WellbutrinXL150; his  son is treated for depression, friend died w/ metastatic lung cancer EXAM shows Afeb, VSS, O2sat=94% on RA;  HEENT- dental issues, mallampati2;  Chest- decr BS bilat w/o w/r/r;  Heart- RR w/o m/r/g;  Abd- soft, nontender, neg;  Ext- w/o c/c/e;  Neuro- intact...  Low dose screening CT Chest10/14/16>  Norm heart size, coronary & aortic calcif, 13mm noncalcif nodule in LUL, scat calcif granulomas up to 2.102mm; mod centrilobular & paraseptal emphysema, abd is ok w/ 2cm cyst in liver...  IMP/PLAN>>  Legrand Como & I discussed his continued smoking & he is not motivated to quit- offered meds and he will call if he changes his mind;  Rec to continue Symbicort160-2spBid, he was tried on Spiriva respimat in past but wouldn't fill it due to cost- I pointed out that if he'd quit smoking he could get the Spiriva & noted that his breathing would be better due to both things! Rec to add Mucinex 2400mg /d.Marland KitchenMarland KitchenWe plan ROV 59months.  ~  December 07, 2015:  83mo ROV w/ SN>  PCP- DrTisovec, Cards- DrTilley, GI- DrArmbruster...    [name] has had some recent dental work w/ mult teeth pulled; he remains in cardiac rehab maintenance program and doing well; no new complaints or concerns; he is using his Symbicort160-2spBid, not using the Mucinex; he has chr stable DOE, min cough, small amt beige sput, no CP/ palpit/ edema...    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     Cigarette smoker>  Still smoking 1.5ppd and he has smoked 1-1.5ppd for ~60 yrs; he declines smoking cessation help & is not motivated to quit...    Hx cough syncope> aware    CARDIAC issues> HBP, CAD, HL- followed by DrTilley (no notes in Epic)- on Plavix75, Amlod5-1/2, Diovan80, Lip80, [phone]; he remains in cardiac rehab regularly... EXAM shows Afeb, VSS, O2sat=92% on RA;  HEENT- dental issues, mallampati2;  Chest- decr BS  bilat w/o w/r/r;  Heart- RR w/o m/r/g;  Abd- soft, nontender, neg;  Ext- w/o c/c/e;  Neuro- intact... IMP/PLAN>>  [name] is unable to quit smoking and has mod COPD w/ emphysema on his CTscans- we will add INCRUSE one inhalation daily; he knows to avoid infections & be sure he is up to date on all vaccinations from DrTisovec... we plan ROV 56mo at which time he will need his next screening CT Chest...  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 He had a recent trip to [place] & did ok w/ walking & breathing but had some back discomfort that was more limiting; he saw DrDumonski recently w/ talk of rods and screws "but I'm too high risk" and they are considering trial of nerve blocks... Of interest he noted that his 19 y/o son (adopted) had a baby daughter recently Dx w/ CF... 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    Cigarette smoker>  Still smoking 1.5ppd and he has smoked 1-1.5ppd for ~60 yrs; he declines smoking cessation help & is not motivated to quit...    Hx cough syncope> past hx syncope w/ severe coughing  paroxysms- aware    Cardiac issues> HBP, CAD, HL- followed by DrTilley (no notes in Epic, pt reports last seen 02/2016)- on Plavix75, Amlod5-1/2, Diovan80, Lip80, Tricor145; he remains in cardiac rehab regularly...    Medical issues> followed by DrTisovec- Hx dysphagia/ stricture/ Barrett's, LBP- s/p lumbar lam, anxiety/depression EXAM shows Afeb, VSS, O2sat=98% on RA;  HEENT- dental issues, mallampati2;  Chest- decr BS bilat w/o w/r/r;  Heart- RR w/o m/r/g;  Abd- soft, nontender, neg;  Ext- w/o c/c/e;  Neuro- intact...  CXR 06/10/16>  Norm heart size, atherosclerotic Ao, no adenopathy, stable 24mm opac in LUL is noted- NAD, old trauma to left clavicle...   Low-dose screening CT Chest 06/18/16>  Norm heart size w/ atherosclerotic changes in Ao, great vessels, & coronaries; No adenopathy; Mult tiny pulm nodules bilat, most are calcified & c/w granulomas; Largest non-calcif nodule is 4-73mm  in LUL & no change from 27yr ago- no new lesions; Diffuse bronchial wall thickening, & mod centrilobular & paraseptal emphysema; Small liver cyst & small bilat renal cysts noted...  Spirometry 06/10/16>  FVC=4.10 (112%), FEV1=2.00 (76%), %1sec=49%, mid-flows reduced at 32% predicted...  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   ~  October 09, 2016:  70mo ROV & add-on appt requested for syncopal episode>  Kirin describes a mild COPD exac recently and ~10d ago he was seen by PA in DrTisovec's office & given ZPak & Pred pack; he also states that he hasn't been sleeping well at night due to back pain & c/o urinary  frequency (having to get up about 10x per night which he thinks is related to Lyrica making his ankles swell & his HCT diuretic); then about 4 nights ago he got up to urinate and doesn't remember what happened next but he appears to have had a syncopal episode & awoke a short time late on the floor of his bedroom having lacerated his forehead & bled on the carpet, plus 2 black eyes; he had a remote hx of cough syncope & wondered if this might have occurred again but clearly this episode was very different than previous (ie- previously he had a severe coughing paroxysm that lead to syncope from the valsalva, and this episode did not involve coughing paroxysm in fact he noted that legs were very weak due to his spinal condition & sounds like he fell- hit his head w/ prob concussion- and awoke a short time later w/ amnesia for the actual fall); Note- he did not call 911 or go to the ER or urgent care facility; he called DrTisovec's office and was referred here for pulmonary eval due to his remote hx of cough syncope... NOTE- DrDumonski plans back surg in ~10 days! 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    Cigarette smoker>  Still smoking 1.5ppd and he has smoked 1-1.5ppd for ~60 yrs; he declines  smoking cessation help & is not motivated to quit despite knowing the risks...    Hx cough syncope> past hx syncope w/ severe coughing paroxysms while on an ACE inhibitor- aware    Cardiac issues> HBP, CAD, HL- followed by DrTilley (no notes in Epic, pt reports last seen  09/2016 & cleared for back surg w/ neg nuclear study)- on ASA/Plavix75, Diovan160, HCT12.5, Lip80, Tricor145; he quit cardiac rehab maintenance 06/2016    Medical issues> followed by DrTisovec- Hx dysphagia/ stricture/ Barrett's (on Prilosec40), prostate and bladder problems (on Myrbetriq & Cialis), severe LBP & s/p lumbar lam (on Vicodin & Lyrica), anxiety/depression EXAM shows Afeb, VSS, O2sat=98% on RA;  HEENT- dental issues, mallampati2;  Chest- congested cough w/ scat basilar rhonchi, decr BS bilat w/o w/r/consolidation;  Heart- RR w/o m/r/g;  Abd- soft, nontender, neg;  Ext- w/o c/c/e.   CXR in [place] 08/02/16 (independently reviewed by me in the PACS system) showed norm heart size, atherosclerotic ao, clear lungs- NAD,   LABS in Epic 08/2016>  Chems showed hyponatremia w/ Na=132, & renal insuffic w/ BUN=38, Cr=2.04;  CBC is wnl w/ Hg=13.0.Marland KitchenMarland Kitchen  IMP/PLAN>>  Halbert's pulmonary situation remains the same- mixed COPD w/ emphysematous and chr bronchitic components, still smoking 1.5ppd, using his Symbicort (but I suspect not regularly), refuses addition of a LAMA med that would be additionally beneficial in the long run;  He improved after Zithromax & Pred pack per DrTisovec recently & we will place him on a sl longer course of oral PRED (see AVS), plus maximize mucolytic therapy w/ GFN400-2Qid + fluids;  His recent ?syncopal spell does not appear to be cough related- perhaps a fall from leg weakness, head trauma w/ concussion & retrograde amnesia for the fall- in any event he did not go to the ER & is in need of concussion eval/ CT vs MRI head (he is on ASA/Plavix)/ etc; pt has an ASAP appt w/ his PCP in the AM... I am concerned regarding his  surgical risk.  NOTE: >50% of this 67min visit was spent in counseling & coordination of care.    Past Medical History:  Diagnosis Date  . Allergy    seasonal  . Arthritis   . Barrett esophagus   . BPH (benign prostatic hyperplasia)   . Bronchitis    recently   . Bruise    forehead and under both eyes  . CAD (coronary artery disease)    takes Plavix and Aspirin daily  . Chronic back pain   . COPD (chronic obstructive pulmonary disease) (HCC)    Albuterol inhaler as needed.Uses Symbicort daily  . Depression    doesn't take any meds  . Emphysema lung ([place])   . Fall    fell on 10/06/16 with loss of consiousness for a short time.States he remembers getting up but remembers nothing else.  Marland Kitchen GERD (gastroesophageal reflux disease)    takes Omeprazole daily  . History of colon polyps    benign  . History of kidney stones   . Hyperlipidemia    takes Fenofibrate and Lipitor daily  . Hypertension    takes HCTZ and Diovan daily  . Joint pain   . Joint swelling   . 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 Neuropathy ([place])    Lyrica every couple of days.  . Nocturia   . Pneumonia    hx of  . PONV (postoperative nausea and vomiting)   . Skin cancer   . Stroke [facility]) 1995   Affected balance; and has very emotional if watching a movie  . Syncope    yr ago  . Urgency of urination    takes Myrbetriq daily    Past Surgical History:  Procedure Laterality Date  . APPENDECTOMY    .  CARDIAC CATHETERIZATION     x 4. Most recent one in 2011 at [facility](3 previous were done 20 yrs before)  . cataracts Bilateral   . COLONOSCOPY    . CYSTOSCOPY  07/21/2013   Dr. Jeffie Pollock  . DENTAL SURGERY     with sedation  . ESOPHAGEAL MANOMETRY N/A 09/20/2015   Procedure: ESOPHAGEAL MANOMETRY (EM);  Surgeon: Manus Gunning, MD;  Location: [facility];  Service: Gastroenterology;  Laterality: N/A;  . LUMBAR LAMINECTOMY/DECOMPRESSION MICRODISCECTOMY N/A  01/12/2015   Procedure: LUMBAR LAMINECTOMY/DECOMPRESSION MICRODISCECTOMY 3 LEVELS;  Surgeon: Phylliss Bob, MD;  Location: [facility];  Service: Orthopedics;  Laterality: N/A;  Lumbar 3-4, lumbar 4-5, lumbar 5-sacrum 1 decompression  . parathyroid adenoma removed    . POLYPECTOMY    . ROTATOR CUFF REPAIR     bilateral  . TONSILLECTOMY    . UPPER GASTROINTESTINAL ENDOSCOPY      Outpatient Encounter Prescriptions as of 10/09/2016  Medication Sig  . acetaminophen (TYLENOL) 500 MG tablet Take 1,000 mg by mouth every 6 (six) hours as needed (for back pain.).  Marland Kitchen albuterol (PROVENTIL HFA;VENTOLIN HFA) 108 (90 BASE) MCG/ACT inhaler Inhale 2 puffs into the lungs every 6 (six) hours as needed for wheezing or shortness of breath. Reported on 11/29/2015  . aspirin EC 81 MG tablet Take 81 mg by mouth daily.  Marland Kitchen atorvastatin (LIPITOR) 80 MG tablet Take 80 mg by mouth daily.   . clopidogrel (PLAVIX) 75 MG tablet Take 75 mg by mouth daily.   . fenofibrate (TRICOR) 145 MG tablet Take 145 mg by mouth daily.   . folic acid (FOLVITE) A999333 MCG tablet Take 400 mcg by mouth daily.  . hydrochlorothiazide (HYDRODIURIL) 12.5 MG tablet Take 12.5 mg by mouth daily.  Marland Kitchen HYDROcodone-acetaminophen (NORCO/VICODIN) 5-325 MG tablet Take 0.5-1 tablets by mouth 2 (two) times daily as needed for severe pain.  Marland Kitchen LYRICA 75 MG capsule Take 75 mg by mouth daily.  Marland Kitchen MYRBETRIQ 50 MG TB24 tablet Take 50 mg by mouth daily.   Marland Kitchen NITROSTAT 0.4 MG SL tablet Place 0.4 mg under the tongue every 5 (five) minutes as needed for chest pain. Reported on 11/29/2015  . omeprazole (PRILOSEC) 40 MG capsule TAKE ONE CAPSULE BY MOUTH TWICE DAILY  . SYMBICORT 160-4.5 MCG/ACT inhaler INHALE TWO PUFFS INTO LUNGS TWICE DAILY  . tadalafil (CIALIS) 5 MG tablet Take 5 mg by mouth daily. For BPH  . valsartan (DIOVAN) 160 MG tablet Take 160 mg by mouth daily.    Allergies  Allergen Reactions  . Ivp Dye [Iodinated Diagnostic Agents] Hives and Shortness Of Breath     reaction was when pt was 76 years old.  Clancy Gourd [Nitrofurantoin] Other (See Comments)    Fever that lasted several months    Immunization History  Administered Date(s) Administered  . Influenza Split 05/14/2012  . Influenza Whole 06/02/2010, 05/09/2011  . Influenza, High Dose Seasonal PF 06/17/2016  . Influenza,inj,Quad PF,36+ Mos 06/02/2013, 05/24/2014  . Influenza-Unspecified 05/31/2015  . Pneumococcal Polysaccharide-23 06/02/2009  . Tdap 10/29/2014    Current Medications, Allergies, Past Medical History, Past Surgical History, Family History, and Social History were reviewed in [employer] record.   Review of Systems  Constitutional: Positive for fatigue. Negative for fever and unexpected weight change.  HENT: Positive for congestion and dental problem. Negative for ear pain, nosebleeds, rhinorrhea, sinus pressure, sneezing, sore throat and trouble swallowing.   Eyes: Positive for redness. Negative for itching.       Bilat  black eyes from recent head trauma  Respiratory: Positive for cough and shortness of breath. Negative for chest tightness and wheezing.   Cardiovascular: Negative.  Negative for chest pain, palpitations and leg swelling.  Gastrointestinal: Negative for nausea and vomiting.  Genitourinary: Positive for frequency. Negative for dysuria.  Musculoskeletal: Positive for back pain and gait problem. Negative for joint swelling.  Skin: Negative for rash.  Neurological: Positive for headaches.  Psychiatric/Behavioral: Positive for sleep disturbance. Negative for dysphoric mood. The patient is nervous/anxious.       Objective:   Physical Exam    Vital Signs:  Reviewed...   General:  WD, WN, 76 y/o WM chr ill appearing; alert & oriented; pleasant & cooperative... HEENT:  Forehead laceration, bilat black eyes; Conjunctiva- red, Sclera- nonicteric, EOM-wnl, PERRLA; EACs-clear, TMs-wnl; NOSE-clear; THROAT- sl red Neck:  Supple w/ decr ROM;  no JVD; normal carotid impulses w/o bruits; no thyromegaly or nodules palpated; no lymphadenopathy.  Chest:  Decr BS bilat w/ few scat rhonchi at bases, congested cough, no wheezing/ rales/ or consolidation... Heart:  Regular Rhythm; norm S1 & S2 without murmurs, rubs, or gallops detected. Abdomen:  Soft & nontender- no guarding or rebound; normal bowel sounds; no organomegaly or masses palpated. Ext:  Decr ROM; without deformities, +arthritic changes; no varicose veins, venous insuffic, or edema;  Pulses intact w/o bruits. Neuro:  CNs II-XII intact; motor testing normal; sensory testing normal; +gait abn, sl leg weakness, balance is off... Derm:  No lesions noted x bilat ecchymoses periorbitally... Lymph:  No cervical, supraclavicular, axillary, or inguinal adenopathy palpated.    Assessment & Plan:    IMP >>     COPD> on Symbicort160-2spBid, ProventilHFA prn; he never filled the Incruse or Spiriva ($$); rec to add GFN400-2Qid + fluids...    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    Cigarette smoker>  Still smoking 1.5ppd and he has smoked 1-1.5ppd for ~60 yrs; he declines smoking cessation help & is not motivated to quit...    Hx cough syncope> this occurred remotely w/ severe coughing paroxysm while he was on an ACE inhib...    CARDIAC issues> HBP, CAD, HL- followed by DrTilley (no notes in Epic)- on ASA/Plavix75, Diovan160, HCT12.5, Lip80, Tricor145...    MEDICAL ISSUES>        Dysphagia, hx esoph stricture & Barrett's esoph> on Prilosec40; eval by DrKaplan in past w/ hx dilatation in the past, now followed by DrArmbruster...       GU issues> on Myrbetriq50, Cialis5       Ortho- LBP w/ lumbar lam 2016 by DrDumonski who now plans further extensive surg...       Anxiety/depression> prev on WellbutrinXL150; his son is treated for depression, friend died w/ metastatic  lung cancer  PLAN >>   06/07/15>   Normal & I discussed his continued smoking & he is not motivated to quit- offered meds and he will call if he changes his mind;  Rec to continue Symbicort160-2spBid, he was tried on Spiriva respimat in past but wouldn't fill it due to cost- I pointed out that if he'd quit smoking he could get the Spiriva & noted that his breathing would be better due to both things! Rec to add Mucinex 2400mg /d.Marland KitchenMarland KitchenWe plan ROV 30months. 12/07/15>   Osmel is unable to quit smoking and has mod COPD w/ emphysema on his CTscans- we will add  INCRUSE one inhalation daily; he knows to avoid infections & be sure he is up to date on all vaccinations from DrTisovec. 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; same Rx & we plan ROV 83mo 10/09/16>   Django's pulmonary situation remains the same- mixed COPD w/ emphysematous and chr bronchitic components, still smoking 1.5ppd, using his Symbicort (but I suspect not regularly), refuses addition of a LAMA med that would be additionally beneficial in the long run;  He improved after Zithromax & Pred pack per DrTisovec recently & we will place him on a sl longer course of oral PRED (see AVS), plus maximize mucolytic therapy w/ GFN400-2Qid + fluids;  His recent ?syncopal spell does not appear to be cough related- perhaps a fall from leg weakness, head trauma w/ concussion & retrograde amnesia for the fall- in any event he did not go to the ER & is in need of concussion eval/ CT vs MRI head (he is on ASA/Plavix)/ etc; pt has an ASAP appt w/ his PCP in the AM... I am concerned regarding his surgical risk.   Patient's  Medications  New Prescriptions   PREDNISONE (DELTASONE) 20 MG TABLET    Take as directed  Previous Medications   ACETAMINOPHEN (TYLENOL) 500 MG TABLET    Take 1,000 mg by mouth every 6 (six) hours as needed (for back pain.).   ALBUTEROL (PROVENTIL HFA;VENTOLIN HFA) 108 (90 BASE) MCG/ACT INHALER    Inhale 2 puffs into the lungs every 6 (six) hours as needed for wheezing or shortness of breath. Reported on 11/29/2015   ASPIRIN EC 81 MG TABLET    Take 81 mg by mouth daily.   ATORVASTATIN (LIPITOR) 80 MG TABLET    Take 80 mg by mouth daily.    CLOPIDOGREL (PLAVIX) 75 MG TABLET    Take 75 mg by mouth daily.    FENOFIBRATE (TRICOR) 145 MG TABLET    Take 145 mg by mouth daily.    FOLIC ACID (FOLVITE) A999333 MCG TABLET    Take 400 mcg by mouth daily.   HYDROCHLOROTHIAZIDE (HYDRODIURIL) 12.5 MG TABLET    Take 12.5 mg by mouth daily.   HYDROCODONE-ACETAMINOPHEN (NORCO/VICODIN) 5-325 MG TABLET    Take 0.5-1 tablets by mouth 2 (two) times daily as needed for severe pain.   LYRICA 75 MG CAPSULE    Take 75 mg by mouth daily.   MYRBETRIQ 50 MG TB24 TABLET    Take 50 mg by mouth daily.    NITROSTAT 0.4 MG SL TABLET    Place 0.4 mg under the tongue every 5 (five) minutes as needed for chest pain. Reported on 11/29/2015   OMEPRAZOLE (PRILOSEC) 40 MG CAPSULE    TAKE ONE CAPSULE BY MOUTH TWICE DAILY   SYMBICORT 160-4.5 MCG/ACT INHALER    INHALE TWO PUFFS INTO LUNGS TWICE DAILY   TADALAFIL (CIALIS) 5 MG TABLET    Take 5 mg by mouth daily. For BPH   VALSARTAN (DIOVAN) 160 MG TABLET    Take 160 mg by mouth daily.  Modified Medications   No medications on file  Discontinued Medications   No  medications on file

## 2016-10-09 NOTE — Progress Notes (Signed)
Per pt he states he was told to hold Plavix 5-7 days prior to surgery and that Fri will be his last [place]

## 2016-10-09 NOTE — Progress Notes (Signed)
Called Dr.[name]'s office and left a message for his nurse that pt fell on Sun and didn't report this to anyone and needs to be seen. Awaiting a return call.

## 2016-10-09 NOTE — Progress Notes (Addendum)
Anesthesia Consult:  Pt is a 76 year old male scheduled for R sided L4-5, L5-S1 transforaminal lumbar interbody fusion with instrumentation and allograft on 10/17/2016 with Phylliss Bob, MD.   - PCP is Domenick Gong, MD - Pulmonologist is Teressa Lower, MD - Cardiologist is Tollie Eth, MD, who cleared pt for surgery 08/22/16.   History includes current smoker, COPD, CAD s/p PTCA OM2 '11, CVA, GERD, HLD, HTN, wet macular degeneration, depression, BPH, skin cancer, Barrett's esophagus. Diagnoses in 2016 with cough induced syncope. BMI 25.5. S/p lumbar laminectomy 01/12/15.   Called to see pt at PAT.  He has a laceration on his forehead that is scabbed over, and has significant periorbital bruising.  Pt reports at around 5am on 10/07/16, he woke up and got out of bed.  The next thing he remembers is waking up on the floor bleeding from a wound on his forehead.  He does not recall any precipitating symptoms or falling. No similar episodes since.  He has not been evaluated for his syncope, fall and LOC.  He denies dizziness, lightheadedness, pre-syncopal symptoms, palpitations, chest pain.  He has had increased coughing lately for recent respiratory illness.  Lungs CTA B, HRRR. Gait steady, fluid. Dr. Loren Racer office notified of syncope, fall and LOC and they will see pt for eval 10/10/16 in the morning. Pt is seeing Dr. Lenna Gilford for respiratory illness after he leaves PAT. I notified Carla in Dr. Laurena Bering office that pt will need medical clearance due to syncope prior to surgery.   BP (!) 142/90   Pulse 86   Temp 36.4 C   Resp 20   Ht 5\' 6"  (1.676 m)   Wt 160 lb (72.6 kg)   SpO2 98%   BMI 25.82 kg/m    Pt was evaluated by Dr. Osborne Casco, Dr. Wynonia Lawman, Dr. Lenna Gilford, and neurologist Dr. Verdene Rio for syncope in 2016.  Determined to be cough induced syncope.   Meds include albuterol, ASA, amlodipine, Plavix, Lipitor, Tricor, Folvite, HCTZ, lyrica, myrbetriq, Nitro, prednisone, Symbicort, Cialis,  valsartan.  Preoperative labs reviewed.  Cr 1.44, BUN 19. Prior Cr results in Epic range 1.26-2.04 over last 1.5 years.   CXR 09/30/16:  Hyperexpansion. No active disease.  EKG 08/02/16: Sinus rhythm with occasional PVCs  Nuclear stress test 08/22/16 (Dr. Wynonia Lawman). 1. Normal Lexiscan Myoview with no evidence of ischemia or infarction. 2. Normal quantitative gated SPECT EF 50% with normal wall motion and wall thickening.  10/10/14 Echo (Dr. Wynonia Lawman): Moderate concentric LVH with normal LV systolic function, estimated EF 65%. Doppler evidence of grade 1 diastolic dysfunction. Mild to moderate TR with mild elevation of RVSP. Mild MR. Trace PR.   11/06/09 Cardiac cath: LV is normal in size. Estimated EF 60%. Calcification in teh left coronary system. LM NL. LAD with scattered irregularities, but no severe focal obstructive stenoses. CX is a large codominant vessel. OM2 with mild ostial stenosis. OM2 has long segmental proximal stenosis at 40-505% then segmental stenosis 60-70% followed by a focal eccentric stenosis of approximately 90%. RCA with mild irregularities and ends in a single PDA. S/P PTCA distal OM2 11/06/09 reducing 95% to < 25% with TIMI grade 3 flow.   Willeen Cass, FNP-BC [facility]/Anesthesiology Phone: [phone] 10/11/2016 4:40 PM  Addendum: Patient seen by Reginold Agent, NP on 10/11/16 at [facility] Vitals:  BP 110/62, HR 78 (lying)  BP 102/62, HR 82 (sitting)  BP 84/56, HR 92 (standing). She thought recent syncopal episode was likely due to  orthostatic syncope. HCTZ placed on hold. He was instructed to contact their office 10/14/16 or 10/15/16 with home BP results. Patient apparently continued on a prednisone taper by pulmonology due to COPD exacerbation (treated by PCP ~ 10 days prior), but was doing better from a pulmonology standpoint. A head CT was ordered and was unremarkable (see below). She would consider a Holter monitor if  symptoms recurred.  CT head w/o contrast 10/10/16 ([facility]; see Care Everywhere): FINDINGS:  No intracranial hemorrhage. White matter is grossly intact. No intracranial mass, mass effect, or midline shift. Ventricles: No hydrocephalus. Visualized paranasal sinuses: Clear. Visualized mastoid air cells: Clear. Calvarium: Intact. IMPRESSION: No acute intracranial process.  Carla at Dr. Laurena Bering office said that [facility] staff reported today that patient was medically cleared for surgery this week, but she is waiting for the signed form from Dr. Loren Racer office. (She will fax to PAT once received). I did notify her that patient has been on a prednisone taper, but "doing much better" per 10/11/16 office note. " She will review with Dr. Lynann Bologna to see if he thinks surgery will need to be postponed until he if off steroids.   George Hugh [facility]/Anesthesiology Phone [phone] 10/14/2016 3:27 PM

## 2016-10-09 NOTE — Patient Instructions (Signed)
Today we updated your med list in our EPIC system...    Continue your current medications the same...    Be sure you are using the SYMBICORT160- 2sprays twice daily every Griner...  You must quit all smoking!!!  Today we gave you a DepoMedrol shot... In the AM 2/8> start the PREDNISONE 20mg  tabs- one tab twice daily for 4 days...    Then decease to 1 tab each AM for 4 days...    Then decrease to 1/2 tab each AM til gone...  Continue the GUAIFENESIN 400mg  tabs taking 2 tabs four times daily w/ extra fluids....  Let's plan a follow up visit in 6wks, sooner if needed for breathing problems.Marland KitchenMarland Kitchen

## 2016-10-09 NOTE — Progress Notes (Signed)
Left a message with Angela Nevin at Dr.Dumonski's office that pt fell on Sun

## 2016-10-12 ENCOUNTER — Telehealth: Payer: Self-pay | Admitting: Cardiology

## 2016-10-12 NOTE — Telephone Encounter (Signed)
Paged regarding labile BPs. Brad Turner had HCTZ stopped by PCP after what sounds like traumatic orthostatic syncope at home with a head lac and periorbital bruising. On 2/7, his pulmonologist prescribed a prednisone taper.  Since Friday, he has had a BP as low as [id number] systolic but most readings have been 150-180s. No SBPs 190 or greater, denied HA, CP, SOB, etc. with increased SBPs. Usually, SBP is 130s.  We spoke at length about how the high readings are most likely related to the prednisone. He asked about restarting the HCTZ but I told him I was hesitant because he is due to decrease the prednisone (20mg ) from BID to daily on Monday and it can take a few days for HCTZ to work. His current BP med is diovan 160mg . We reviewed that although current BP is not optimal from a long term perspective, as long as it isn't getting higher, it is likely fine for the time being as he has not had any symptoms.   The plan we agreed upon is as follows: 1. Continue to measure BPs BID or if symptoms of CP, HA, SOB, numbness, weakness, tingling occur. Call if SBPs >190 or if HTN with sx.  2. Decreased prednisone to daily dosing tomorrow (a Grams early) 3. Call PCP office on Monday.   All questions answered. Patient in agreement with plan.

## 2016-10-14 NOTE — H&P (Signed)
PREOPERATIVE H&P  Chief Complaint: Right leg pain and weakness  HPI: Brad Turner is a 76 y.o. male who presents with ongoing pain in the weakness and pain in the right leg. Of note, patient is status post an L3-L4, L4-L5, and L5-S1 decompression from approximately 2 years ago.  The patient did well from that surgery, but did subsequently develop right leg pain and weakness.  Patient's updated MRI reveals significant collapse of L4/5 and L5/S1 discs with right NF stenosis at L4/5 and L5/S1, with spondy noted at L4/5  Patient has failed multiple forms of conservative care and continues to have pain (see office notes for additional details regarding the patient's full course of treatment)  Past Medical History:  Diagnosis Date  . Allergy    seasonal  . Arthritis   . Barrett esophagus   . BPH (benign prostatic hyperplasia)   . Bronchitis    recently   . Bruise    forehead and under both eyes  . CAD (coronary artery disease)    takes Plavix and Aspirin daily  . Chronic back pain   . COPD (chronic obstructive pulmonary disease) (HCC)    Albuterol inhaler as needed.Uses Symbicort daily  . Depression    doesn't take any meds  . Emphysema lung ([place])   . Fall    fell on 10/06/16 with loss of consiousness for a short time.States he remembers getting up but remembers nothing else.  Marland Kitchen GERD (gastroesophageal reflux disease)    takes Omeprazole daily  . History of colon polyps    benign  . History of kidney stones   . Hyperlipidemia    takes Fenofibrate and Lipitor daily  . Hypertension    takes HCTZ and Diovan daily  . Joint pain   . Joint swelling   . 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 Neuropathy ([place])    Lyrica every couple of days.  . Nocturia   . Pneumonia    hx of  . PONV (postoperative nausea and vomiting)   . Skin cancer   . Stroke [facility]) 1995   Affected balance; and has very emotional if watching a movie  . Syncope      yr ago  . Urgency of urination    takes Myrbetriq daily   Past Surgical History:  Procedure Laterality Date  . APPENDECTOMY    . CARDIAC CATHETERIZATION     x 4. Most recent one in 2011 at [facility](3 previous were done 20 yrs before)  . cataracts Bilateral   . COLONOSCOPY    . CYSTOSCOPY  07/21/2013   Dr. Jeffie Pollock  . DENTAL SURGERY     with sedation  . ESOPHAGEAL MANOMETRY N/A 09/20/2015   Procedure: ESOPHAGEAL MANOMETRY (EM);  Surgeon: Manus Gunning, MD;  Location: [facility] END[facility]COPY;  Service: Gastroenterology;  Laterality: N/A;  . LUMBAR LAMINECTOMY/DECOMPRESSION MICRODISCECTOMY N/A 01/12/2015   Procedure: LUMBAR LAMINECTOMY/DECOMPRESSION MICRODISCECTOMY 3 LEVELS;  Surgeon: Phylliss Bob, MD;  Location: [place];  Service: Orthopedics;  Laterality: N/A;  Lumbar 3-4, lumbar 4-5, lumbar 5-sacrum 1 decompression  . parathyroid adenoma removed    . POLYPECTOMY    . ROTATOR CUFF REPAIR     bilateral  . TONSILLECTOMY    . UPPER GASTROINTESTINAL ENDOSCOPY     Social History   Social History  . Marital status: Single    Spouse name: N/A  . Number of children: 1  . Years of  education: N/A   Occupational History  . PROFESSOR OF [employer]   Social History Main Topics  . Smoking status: Current Every Metzer Smoker    Packs/Siddall: 1.50    Years: 58.00    Types: Cigarettes  . Smokeless tobacco: Never Used  . Alcohol use 8.4 oz/week    14 Standard drinks or equivalent per week     Comment: every Eckman, vodka- 1-2 a Benninger   . Drug use: No  . Sexual activity: Not on file   Other Topics Concern  . Not on file   Social History Narrative  . No narrative on file   Family History  Problem Relation Age of Onset  . Stroke Mother   . Rheum arthritis Mother   . Heart disease Father   . Colon cancer      ? mat. uncle died age 52  . Colon polyps Neg Hx   . Esophageal cancer Neg Hx   . Rectal cancer Neg Hx   . Stomach cancer Neg Hx    Allergies  Allergen Reactions  . Ivp Dye  [Iodinated Diagnostic Agents] Hives and Shortness Of Breath    reaction was when pt was 76 years old.  Clancy Gourd [Nitrofurantoin] Other (See Comments)    Fever that lasted several months   Prior to Admission medications   Medication Sig Start Date End Date Taking? Authorizing Provider  acetaminophen (TYLENOL) 500 MG tablet Take 1,000 mg by mouth every 6 (six) hours as needed (for back pain.).   Yes Historical Provider, MD  albuterol (PROVENTIL HFA;VENTOLIN HFA) 108 (90 BASE) MCG/ACT inhaler Inhale 2 puffs into the lungs every 6 (six) hours as needed for wheezing or shortness of breath. Reported on 11/29/2015   Yes Historical Provider, MD  aspirin EC 81 MG tablet Take 81 mg by mouth daily.   Yes Historical Provider, MD  atorvastatin (LIPITOR) 80 MG tablet Take 80 mg by mouth daily.    Yes Historical Provider, MD  clopidogrel (PLAVIX) 75 MG tablet Take 75 mg by mouth daily.  03/14/15  Yes Historical Provider, MD  fenofibrate (TRICOR) 145 MG tablet Take 145 mg by mouth daily.    Yes Historical Provider, MD  folic acid (FOLVITE) A999333 MCG tablet Take 400 mcg by mouth daily.   Yes Historical Provider, MD  hydrochlorothiazide (HYDRODIURIL) 12.5 MG tablet Take 12.5 mg by mouth daily.   Yes Historical Provider, MD  HYDROcodone-acetaminophen (NORCO/VICODIN) 5-325 MG tablet Take 0.5-1 tablets by mouth 2 (two) times daily as needed for severe pain.   Yes Historical Provider, MD  LYRICA 75 MG capsule Take 75 mg by mouth daily. 07/29/16  Yes Historical Provider, MD  MYRBETRIQ 50 MG TB24 tablet Take 50 mg by mouth daily.  05/03/15  Yes Historical Provider, MD  NITROSTAT 0.4 MG SL tablet Place 0.4 mg under the tongue every 5 (five) minutes as needed for chest pain. Reported on 11/29/2015 05/29/13  Yes Historical Provider, MD  omeprazole (PRILOSEC) 40 MG capsule TAKE ONE CAPSULE BY MOUTH TWICE DAILY 04/22/16  Yes Manus Gunning, MD  SYMBICORT 160-4.5 MCG/ACT inhaler INHALE TWO PUFFS INTO LUNGS TWICE DAILY  04/19/14  Yes Kathee Delton, MD  tadalafil (CIALIS) 5 MG tablet Take 5 mg by mouth daily. For BPH   Yes Historical Provider, MD  valsartan (DIOVAN) 160 MG tablet Take 160 mg by mouth daily.   Yes Historical Provider, MD  predniSONE (DELTASONE) 20 MG tablet Take as directed 10/09/16   Nicki Reaper  Roddie Mc, MD     All other systems have been reviewed and were otherwise negative with the exception of those mentioned in the HPI and as above.  Physical Exam: There were no vitals filed for this visit.  General: Alert, no acute distress Cardiovascular: No pedal edema Respiratory: No cyanosis, no use of accessory musculature Skin: No lesions in the area of chief complaint Neurologic: Sensation intact distally Psychiatric: Patient is competent for consent with normal mood and affect Lymphatic: No axillary or cervical lymphadenopathy  MUSCULOSKELETAL: + SLR on the right  Assessment/Plan: Right Lumbar Radiculopathy Plan for Procedure(s): RIGHT SIDED LUMBAR 4-5, LUMBAR 5-SACRUM 1 TRANSFORAMINAL LUMBAR INTERBODY FUSION WITH INSTRUMENTATION AND ALLOGRAFT   Sinclair Ship, MD 10/14/2016 12:15 PM

## 2016-10-17 ENCOUNTER — Inpatient Hospital Stay (HOSPITAL_COMMUNITY): Payer: Medicare Other

## 2016-10-17 ENCOUNTER — Inpatient Hospital Stay (HOSPITAL_COMMUNITY): Payer: Medicare Other | Admitting: Certified Registered Nurse Anesthetist

## 2016-10-17 ENCOUNTER — Inpatient Hospital Stay (HOSPITAL_COMMUNITY): Payer: Medicare Other | Admitting: Emergency Medicine

## 2016-10-17 ENCOUNTER — Encounter (HOSPITAL_COMMUNITY)
Admission: RE | Disposition: A | Discharge status: Home Health | Payer: Self-pay | Source: Ambulatory Visit | Attending: Orthopedic Surgery

## 2016-10-17 ENCOUNTER — Encounter (HOSPITAL_COMMUNITY): Payer: Self-pay | Admitting: Anesthesiology

## 2016-10-17 ENCOUNTER — Inpatient Hospital Stay (HOSPITAL_COMMUNITY)
Admission: RE | Admit: 2016-10-17 | Discharge: 2016-10-20 | DRG: 455 | Disposition: A | Discharge status: Home Health | Payer: Medicare Other | Source: Ambulatory Visit | Attending: Orthopedic Surgery | Admitting: Orthopedic Surgery

## 2016-10-17 DIAGNOSIS — Z91041 Radiographic dye allergy status: Secondary | ICD-10-CM

## 2016-10-17 DIAGNOSIS — Z86010 Personal history of colonic polyps: Secondary | ICD-10-CM

## 2016-10-17 DIAGNOSIS — R3915 Urgency of urination: Secondary | ICD-10-CM | POA: Diagnosis present

## 2016-10-17 DIAGNOSIS — Z8701 Personal history of pneumonia (recurrent): Secondary | ICD-10-CM

## 2016-10-17 DIAGNOSIS — Z85828 Personal history of other malignant neoplasm of skin: Secondary | ICD-10-CM | POA: Diagnosis not present

## 2016-10-17 DIAGNOSIS — H3530 Unspecified macular degeneration: Secondary | ICD-10-CM | POA: Diagnosis present

## 2016-10-17 DIAGNOSIS — M48061 Spinal stenosis, lumbar region without neurogenic claudication: Principal | ICD-10-CM | POA: Diagnosis present

## 2016-10-17 DIAGNOSIS — Z8249 Family history of ischemic heart disease and other diseases of the circulatory system: Secondary | ICD-10-CM

## 2016-10-17 DIAGNOSIS — Z9181 History of falling: Secondary | ICD-10-CM

## 2016-10-17 DIAGNOSIS — M5410 Radiculopathy, site unspecified: Secondary | ICD-10-CM | POA: Diagnosis present

## 2016-10-17 DIAGNOSIS — Z7902 Long term (current) use of antithrombotics/antiplatelets: Secondary | ICD-10-CM

## 2016-10-17 DIAGNOSIS — G629 Polyneuropathy, unspecified: Secondary | ICD-10-CM | POA: Diagnosis present

## 2016-10-17 DIAGNOSIS — I2510 Atherosclerotic heart disease of native coronary artery without angina pectoris: Secondary | ICD-10-CM | POA: Diagnosis present

## 2016-10-17 DIAGNOSIS — K219 Gastro-esophageal reflux disease without esophagitis: Secondary | ICD-10-CM | POA: Diagnosis present

## 2016-10-17 DIAGNOSIS — Z419 Encounter for procedure for purposes other than remedying health state, unspecified: Secondary | ICD-10-CM

## 2016-10-17 DIAGNOSIS — Z800 Family history of malignant neoplasm of digestive organs: Secondary | ICD-10-CM

## 2016-10-17 DIAGNOSIS — J449 Chronic obstructive pulmonary disease, unspecified: Secondary | ICD-10-CM | POA: Diagnosis present

## 2016-10-17 DIAGNOSIS — M4316 Spondylolisthesis, lumbar region: Secondary | ICD-10-CM | POA: Diagnosis present

## 2016-10-17 DIAGNOSIS — Z9842 Cataract extraction status, left eye: Secondary | ICD-10-CM | POA: Diagnosis not present

## 2016-10-17 DIAGNOSIS — Z8673 Personal history of transient ischemic attack (TIA), and cerebral infarction without residual deficits: Secondary | ICD-10-CM

## 2016-10-17 DIAGNOSIS — Z7982 Long term (current) use of aspirin: Secondary | ICD-10-CM

## 2016-10-17 DIAGNOSIS — R351 Nocturia: Secondary | ICD-10-CM | POA: Diagnosis present

## 2016-10-17 DIAGNOSIS — M4807 Spinal stenosis, lumbosacral region: Secondary | ICD-10-CM | POA: Diagnosis present

## 2016-10-17 DIAGNOSIS — Z881 Allergy status to other antibiotic agents status: Secondary | ICD-10-CM

## 2016-10-17 DIAGNOSIS — I10 Essential (primary) hypertension: Secondary | ICD-10-CM | POA: Diagnosis present

## 2016-10-17 DIAGNOSIS — Z87442 Personal history of urinary calculi: Secondary | ICD-10-CM

## 2016-10-17 DIAGNOSIS — Z823 Family history of stroke: Secondary | ICD-10-CM

## 2016-10-17 DIAGNOSIS — M5416 Radiculopathy, lumbar region: Secondary | ICD-10-CM | POA: Diagnosis present

## 2016-10-17 DIAGNOSIS — K2270 Barrett's esophagus without dysplasia: Secondary | ICD-10-CM | POA: Diagnosis present

## 2016-10-17 DIAGNOSIS — Z8261 Family history of arthritis: Secondary | ICD-10-CM

## 2016-10-17 DIAGNOSIS — Z9841 Cataract extraction status, right eye: Secondary | ICD-10-CM | POA: Diagnosis not present

## 2016-10-17 DIAGNOSIS — M5126 Other intervertebral disc displacement, lumbar region: Secondary | ICD-10-CM | POA: Diagnosis present

## 2016-10-17 DIAGNOSIS — F17210 Nicotine dependence, cigarettes, uncomplicated: Secondary | ICD-10-CM | POA: Diagnosis present

## 2016-10-17 DIAGNOSIS — E785 Hyperlipidemia, unspecified: Secondary | ICD-10-CM | POA: Diagnosis present

## 2016-10-17 DIAGNOSIS — N400 Enlarged prostate without lower urinary tract symptoms: Secondary | ICD-10-CM | POA: Diagnosis present

## 2016-10-17 DIAGNOSIS — I739 Peripheral vascular disease, unspecified: Secondary | ICD-10-CM | POA: Diagnosis present

## 2016-10-17 DIAGNOSIS — Z79899 Other long term (current) drug therapy: Secondary | ICD-10-CM

## 2016-10-17 DIAGNOSIS — Z79891 Long term (current) use of opiate analgesic: Secondary | ICD-10-CM

## 2016-10-17 DIAGNOSIS — R262 Difficulty in walking, not elsewhere classified: Secondary | ICD-10-CM

## 2016-10-17 DIAGNOSIS — M9953 Intervertebral disc stenosis of neural canal of lumbar region: Secondary | ICD-10-CM | POA: Diagnosis present

## 2016-10-17 DIAGNOSIS — Z7951 Long term (current) use of inhaled steroids: Secondary | ICD-10-CM

## 2016-10-17 DIAGNOSIS — M79604 Pain in right leg: Secondary | ICD-10-CM | POA: Diagnosis present

## 2016-10-17 SURGERY — POSTERIOR LUMBAR FUSION 2 LEVEL
Anesthesia: General | Site: Back | Laterality: Right

## 2016-10-17 MED ORDER — METHYLENE BLUE 0.5 % INJ SOLN
INTRAVENOUS | Status: DC | PRN
  Administered 2016-10-17: 71.7 mg via INTRAVENOUS

## 2016-10-17 MED ORDER — SODIUM CHLORIDE 0.9% FLUSH: 3.0000 mL | INTRAVENOUS | Status: DC | PRN

## 2016-10-17 MED ORDER — SODIUM CHLORIDE 0.9% FLUSH
3.0000 mL | Freq: Two times a day (BID) | INTRAVENOUS | Status: DC
  Administered 2016-10-18 – 2016-10-20 (×3): 3 mL via INTRAVENOUS

## 2016-10-17 MED ORDER — ATORVASTATIN CALCIUM 80 MG PO TABS
80.0000 mg | ORAL_TABLET | Freq: Every day | ORAL | Status: DC
  Administered 2016-10-18 – 2016-10-20 (×3): 80 mg via ORAL
  Filled 2016-10-17 (×3): qty 1

## 2016-10-17 MED ORDER — FENTANYL CITRATE (PF) 100 MCG/2ML IJ SOLN
INTRAMUSCULAR | Status: AC
  Filled 2016-10-17: qty 2

## 2016-10-17 MED ORDER — MIRABEGRON ER 25 MG PO TB24
50.0000 mg | ORAL_TABLET | Freq: Every day | ORAL | Status: DC
  Administered 2016-10-18 – 2016-10-20 (×3): 50 mg via ORAL
  Filled 2016-10-17 (×3): qty 2

## 2016-10-17 MED ORDER — THROMBIN 20000 UNITS EX KIT
PACK | CUTANEOUS | Status: DC | PRN
Start: 2016-10-17 — End: 2016-10-17
  Administered 2016-10-17: 20000 [IU] via TOPICAL

## 2016-10-17 MED ORDER — MORPHINE SULFATE (PF) 2 MG/ML IV SOLN
1.0000 mg | INTRAVENOUS | Status: DC | PRN
  Administered 2016-10-17 – 2016-10-18 (×3): 1 mg via INTRAVENOUS
  Filled 2016-10-17 (×3): qty 1

## 2016-10-17 MED ORDER — DOCUSATE SODIUM 100 MG PO CAPS
100.0000 mg | ORAL_CAPSULE | Freq: Two times a day (BID) | ORAL | Status: DC
  Administered 2016-10-17 – 2016-10-20 (×5): 100 mg via ORAL
  Filled 2016-10-17 (×6): qty 1

## 2016-10-17 MED ORDER — BUPIVACAINE-EPINEPHRINE 0.25% -1:200000 IJ SOLN
INTRAMUSCULAR | Status: DC | PRN
  Administered 2016-10-17: 20 mL
  Administered 2016-10-17: 8 mL

## 2016-10-17 MED ORDER — EPINEPHRINE PF 1 MG/ML IJ SOLN
INTRAMUSCULAR | Status: AC
  Filled 2016-10-17: qty 1

## 2016-10-17 MED ORDER — ONDANSETRON HCL 4 MG/2ML IJ SOLN: 4.0000 mg | INTRAMUSCULAR | Status: DC | PRN

## 2016-10-17 MED ORDER — FENTANYL CITRATE (PF) 100 MCG/2ML IJ SOLN
INTRAMUSCULAR | Status: DC | PRN
  Administered 2016-10-17 (×8): 50 ug via INTRAVENOUS

## 2016-10-17 MED ORDER — ALBUTEROL SULFATE (2.5 MG/3ML) 0.083% IN NEBU: 3.0000 mL | INHALATION_SOLUTION | Freq: Four times a day (QID) | RESPIRATORY_TRACT | Status: DC | PRN

## 2016-10-17 MED ORDER — HYDROMORPHONE HCL 1 MG/ML IJ SOLN
INTRAMUSCULAR | Status: DC | PRN
  Administered 2016-10-17 (×2): 0.5 mg via INTRAVENOUS

## 2016-10-17 MED ORDER — PHENYLEPHRINE HCL 10 MG/ML IJ SOLN
INTRAMUSCULAR | Status: DC | PRN
  Administered 2016-10-17: 80 ug via INTRAVENOUS

## 2016-10-17 MED ORDER — ONDANSETRON HCL 4 MG/2ML IJ SOLN
INTRAMUSCULAR | Status: AC
  Filled 2016-10-17: qty 2

## 2016-10-17 MED ORDER — BUPIVACAINE LIPOSOME 1.3 % IJ SUSP
20.0000 mL | Freq: Once | INTRAMUSCULAR | Status: DC
  Filled 2016-10-17: qty 20

## 2016-10-17 MED ORDER — FENOFIBRATE 160 MG PO TABS
160.0000 mg | ORAL_TABLET | Freq: Every day | ORAL | Status: DC
  Administered 2016-10-18 – 2016-10-20 (×3): 160 mg via ORAL
  Filled 2016-10-17 (×3): qty 1

## 2016-10-17 MED ORDER — FOLIC ACID 1 MG PO TABS
0.5000 mg | ORAL_TABLET | Freq: Every day | ORAL | Status: DC
  Administered 2016-10-18 – 2016-10-20 (×3): 0.5 mg via ORAL
  Filled 2016-10-17 (×3): qty 1

## 2016-10-17 MED ORDER — NITROGLYCERIN 0.4 MG SL SUBL: 0.4000 mg | SUBLINGUAL_TABLET | SUBLINGUAL | Status: DC | PRN

## 2016-10-17 MED ORDER — PROPOFOL 500 MG/50ML IV EMUL
INTRAVENOUS | Status: DC | PRN
  Administered 2016-10-17: 50 ug/kg/min via INTRAVENOUS

## 2016-10-17 MED ORDER — ROCURONIUM BROMIDE 100 MG/10ML IV SOLN
INTRAVENOUS | Status: DC | PRN
  Administered 2016-10-17: 50 mg via INTRAVENOUS

## 2016-10-17 MED ORDER — MENTHOL 3 MG MT LOZG: 1.0000 | LOZENGE | OROMUCOSAL | Status: DC | PRN

## 2016-10-17 MED ORDER — THROMBIN 20000 UNITS EX SOLR
CUTANEOUS | Status: AC
  Filled 2016-10-17: qty 20000

## 2016-10-17 MED ORDER — SUGAMMADEX SODIUM 200 MG/2ML IV SOLN
INTRAVENOUS | Status: DC | PRN
  Administered 2016-10-17: 200 mg via INTRAVENOUS

## 2016-10-17 MED ORDER — PREGABALIN 75 MG PO CAPS
75.0000 mg | ORAL_CAPSULE | Freq: Every day | ORAL | Status: DC
  Administered 2016-10-17 – 2016-10-20 (×4): 75 mg via ORAL
  Filled 2016-10-17 (×4): qty 1

## 2016-10-17 MED ORDER — HYDROMORPHONE HCL 1 MG/ML IJ SOLN
INTRAMUSCULAR | Status: AC
  Filled 2016-10-17: qty 1

## 2016-10-17 MED ORDER — SUGAMMADEX SODIUM 200 MG/2ML IV SOLN
INTRAVENOUS | Status: AC
  Filled 2016-10-17: qty 2

## 2016-10-17 MED ORDER — BUPIVACAINE HCL (PF) 0.25 % IJ SOLN
INTRAMUSCULAR | Status: AC
  Filled 2016-10-17: qty 30

## 2016-10-17 MED ORDER — ACETAMINOPHEN 650 MG RE SUPP: 650.0000 mg | Freq: Four times a day (QID) | RECTAL | Status: DC | PRN

## 2016-10-17 MED ORDER — HEMOSTATIC AGENTS (NO CHARGE) OPTIME
TOPICAL | Status: DC | PRN
  Administered 2016-10-17: 1 via TOPICAL

## 2016-10-17 MED ORDER — FOLIC ACID 400 MCG PO TABS: 400.0000 ug | ORAL_TABLET | Freq: Every day | ORAL | Status: DC

## 2016-10-17 MED ORDER — PHENOL 1.4 % MT LIQD
1.0000 | OROMUCOSAL | Status: DC | PRN
Start: 2016-10-17 — End: 2016-10-20

## 2016-10-17 MED ORDER — ZOLPIDEM TARTRATE 5 MG PO TABS: 5.0000 mg | ORAL_TABLET | Freq: Every evening | ORAL | Status: DC | PRN

## 2016-10-17 MED ORDER — HYDROCHLOROTHIAZIDE 25 MG PO TABS: 12.5000 mg | ORAL_TABLET | Freq: Every day | ORAL | Status: DC

## 2016-10-17 MED ORDER — FLEET ENEMA 7-19 GM/118ML RE ENEM: 1.0000 | ENEMA | Freq: Once | RECTAL | Status: DC | PRN

## 2016-10-17 MED ORDER — FENTANYL CITRATE (PF) 100 MCG/2ML IJ SOLN
INTRAMUSCULAR | Status: AC
  Filled 2016-10-17: qty 4

## 2016-10-17 MED ORDER — HYDROMORPHONE HCL 1 MG/ML IJ SOLN
0.2500 mg | INTRAMUSCULAR | Status: DC | PRN
  Administered 2016-10-17 (×2): 0.5 mg via INTRAVENOUS

## 2016-10-17 MED ORDER — HYDROMORPHONE HCL 1 MG/ML IJ SOLN
INTRAMUSCULAR | Status: AC
  Filled 2016-10-17: qty 0.5

## 2016-10-17 MED ORDER — DIAZEPAM 5 MG PO TABS
5.0000 mg | ORAL_TABLET | Freq: Four times a day (QID) | ORAL | Status: DC | PRN
  Administered 2016-10-18 – 2016-10-20 (×7): 5 mg via ORAL
  Filled 2016-10-17 (×7): qty 1

## 2016-10-17 MED ORDER — ROCURONIUM BROMIDE 50 MG/5ML IV SOSY
PREFILLED_SYRINGE | INTRAVENOUS | Status: AC
  Filled 2016-10-17: qty 5

## 2016-10-17 MED ORDER — DEXTROSE 5 % IV SOLN
INTRAVENOUS | Status: DC | PRN
  Administered 2016-10-17: 07:00:00 via INTRAVENOUS

## 2016-10-17 MED ORDER — LACTATED RINGERS IV SOLN
INTRAVENOUS | Status: DC | PRN
  Administered 2016-10-17 (×3): via INTRAVENOUS

## 2016-10-17 MED ORDER — ALUM & MAG HYDROXIDE-SIMETH 200-200-20 MG/5ML PO SUSP: 30.0000 mL | Freq: Four times a day (QID) | ORAL | Status: DC | PRN

## 2016-10-17 MED ORDER — LIDOCAINE HCL (CARDIAC) 20 MG/ML IV SOLN
INTRAVENOUS | Status: DC | PRN
  Administered 2016-10-17: 60 mg via INTRAVENOUS

## 2016-10-17 MED ORDER — SODIUM CHLORIDE 0.9 % IV SOLN: 250.0000 mL | INTRAVENOUS | Status: DC

## 2016-10-17 MED ORDER — ARTIFICIAL TEARS OP OINT
TOPICAL_OINTMENT | OPHTHALMIC | Status: DC | PRN
Start: 2016-10-17 — End: 2016-10-17
  Administered 2016-10-17: 1 via OPHTHALMIC

## 2016-10-17 MED ORDER — MOMETASONE FURO-FORMOTEROL FUM 200-5 MCG/ACT IN AERO
2.0000 | INHALATION_SPRAY | Freq: Two times a day (BID) | RESPIRATORY_TRACT | Status: DC
  Administered 2016-10-18 – 2016-10-20 (×5): 2 via RESPIRATORY_TRACT
  Filled 2016-10-17 (×2): qty 8.8

## 2016-10-17 MED ORDER — POTASSIUM CHLORIDE IN NACL 20-0.9 MEQ/L-% IV SOLN
INTRAVENOUS | Status: DC
  Administered 2016-10-18: 22:00:00 via INTRAVENOUS
  Filled 2016-10-17: qty 1000

## 2016-10-17 MED ORDER — MIDAZOLAM HCL 2 MG/2ML IJ SOLN
INTRAMUSCULAR | Status: AC
  Filled 2016-10-17: qty 2

## 2016-10-17 MED ORDER — LIDOCAINE 2% (20 MG/ML) 5 ML SYRINGE
INTRAMUSCULAR | Status: AC
  Filled 2016-10-17: qty 5

## 2016-10-17 MED ORDER — ACETAMINOPHEN 325 MG PO TABS
650.0000 mg | ORAL_TABLET | Freq: Four times a day (QID) | ORAL | Status: DC | PRN
  Administered 2016-10-19 – 2016-10-20 (×2): 650 mg via ORAL
  Filled 2016-10-17 (×2): qty 2

## 2016-10-17 MED ORDER — ACETAMINOPHEN 325 MG PO TABS: 650.0000 mg | ORAL_TABLET | ORAL | Status: DC | PRN

## 2016-10-17 MED ORDER — PROPOFOL 10 MG/ML IV BOLUS
INTRAVENOUS | Status: DC | PRN
  Administered 2016-10-17: 150 mg via INTRAVENOUS

## 2016-10-17 MED ORDER — ALBUMIN HUMAN 5 % IV SOLN
INTRAVENOUS | Status: DC | PRN
  Administered 2016-10-17: 10:00:00 via INTRAVENOUS

## 2016-10-17 MED ORDER — CEFAZOLIN SODIUM-DEXTROSE 2-4 GM/100ML-% IV SOLN
2.0000 g | INTRAVENOUS | Status: AC
  Administered 2016-10-17 (×2): 2 g via INTRAVENOUS
  Filled 2016-10-17: qty 100

## 2016-10-17 MED ORDER — CEFAZOLIN SODIUM-DEXTROSE 2-4 GM/100ML-% IV SOLN
2.0000 g | Freq: Three times a day (TID) | INTRAVENOUS | Status: AC
  Administered 2016-10-17 (×2): 2 g via INTRAVENOUS
  Filled 2016-10-17 (×2): qty 100

## 2016-10-17 MED ORDER — METHYLENE BLUE 0.5 % INJ SOLN
INTRAVENOUS | Status: AC
  Filled 2016-10-17: qty 10

## 2016-10-17 MED ORDER — METOCLOPRAMIDE HCL 5 MG/ML IJ SOLN
INTRAMUSCULAR | Status: AC
  Filled 2016-10-17: qty 2

## 2016-10-17 MED ORDER — BISACODYL 5 MG PO TBEC: 5.0000 mg | DELAYED_RELEASE_TABLET | Freq: Every day | ORAL | Status: DC | PRN

## 2016-10-17 MED ORDER — SENNOSIDES-DOCUSATE SODIUM 8.6-50 MG PO TABS: 1.0000 | ORAL_TABLET | Freq: Every evening | ORAL | Status: DC | PRN

## 2016-10-17 MED ORDER — ONDANSETRON HCL 4 MG/2ML IJ SOLN
INTRAMUSCULAR | Status: DC | PRN
  Administered 2016-10-17: 4 mg via INTRAVENOUS

## 2016-10-17 MED ORDER — OXYCODONE HCL 5 MG PO TABS
5.0000 mg | ORAL_TABLET | ORAL | Status: DC | PRN
  Administered 2016-10-17 – 2016-10-19 (×8): 10 mg via ORAL
  Administered 2016-10-20: 5 mg via ORAL
  Filled 2016-10-17: qty 2
  Filled 2016-10-17: qty 1
  Filled 2016-10-17 (×8): qty 2

## 2016-10-17 MED ORDER — BUPIVACAINE LIPOSOME 1.3 % IJ SUSP
INTRAMUSCULAR | Status: DC | PRN
  Administered 2016-10-17: 20 mL

## 2016-10-17 MED ORDER — 0.9 % SODIUM CHLORIDE (POUR BTL) OPTIME
TOPICAL | Status: DC | PRN
  Administered 2016-10-17 (×4): 1000 mL

## 2016-10-17 MED ORDER — SODIUM CHLORIDE 0.9 % IV SOLN
INTRAVENOUS | Status: DC | PRN
  Administered 2016-10-17: 14:00:00 via INTRAVENOUS

## 2016-10-17 MED ORDER — MIDAZOLAM HCL 5 MG/5ML IJ SOLN
INTRAMUSCULAR | Status: DC | PRN
  Administered 2016-10-17: 2 mg via INTRAVENOUS

## 2016-10-17 MED ORDER — HYDROCHLOROTHIAZIDE 12.5 MG PO CAPS
12.5000 mg | ORAL_CAPSULE | Freq: Every day | ORAL | Status: DC
  Administered 2016-10-18 – 2016-10-20 (×3): 12.5 mg via ORAL
  Filled 2016-10-17 (×3): qty 1

## 2016-10-17 MED ORDER — VECURONIUM BROMIDE 10 MG IV SOLR
INTRAVENOUS | Status: DC | PRN
  Administered 2016-10-17 (×2): 2 mg via INTRAVENOUS
  Administered 2016-10-17: 4 mg via INTRAVENOUS

## 2016-10-17 MED ORDER — METOCLOPRAMIDE HCL 5 MG/ML IJ SOLN
INTRAMUSCULAR | Status: DC | PRN
  Administered 2016-10-17: 10 mg via INTRAVENOUS

## 2016-10-17 MED ORDER — PHENYLEPHRINE HCL 10 MG/ML IJ SOLN
INTRAVENOUS | Status: DC | PRN
  Administered 2016-10-17: 20 ug/min via INTRAVENOUS

## 2016-10-17 MED ORDER — IRBESARTAN 150 MG PO TABS
150.0000 mg | ORAL_TABLET | Freq: Every day | ORAL | Status: DC
  Administered 2016-10-18 – 2016-10-20 (×3): 150 mg via ORAL
  Filled 2016-10-17 (×3): qty 1

## 2016-10-17 MED ORDER — ACETAMINOPHEN 650 MG RE SUPP: 650.0000 mg | RECTAL | Status: DC | PRN

## 2016-10-17 MED ORDER — PANTOPRAZOLE SODIUM 40 MG PO TBEC
80.0000 mg | DELAYED_RELEASE_TABLET | Freq: Every day | ORAL | Status: DC
  Administered 2016-10-17 – 2016-10-20 (×4): 80 mg via ORAL
  Filled 2016-10-17 (×4): qty 2

## 2016-10-17 MED ORDER — ARTIFICIAL TEARS OP OINT
TOPICAL_OINTMENT | OPHTHALMIC | Status: AC
  Filled 2016-10-17: qty 3.5

## 2016-10-17 MED FILL — Sodium Chloride IV Soln 0.9%: INTRAVENOUS | Qty: 2000 | Status: AC

## 2016-10-17 MED FILL — Heparin Sodium (Porcine) Inj 1000 Unit/ML: INTRAMUSCULAR | Qty: 30 | Status: AC

## 2016-10-17 SURGICAL SUPPLY — 94 items
BENZOIN TINCTURE PRP APPL 2/3 (GAUZE/BANDAGES/DRESSINGS) ×3 IMPLANT
BIT DRILL 3.2 (BIT) ×2
BIT DRILL 65X3.2XQC STP NS (BIT) ×1 IMPLANT
BIT DRL 65X3.2XQC STP NS (BIT) ×1
BLADE SURG ROTATE 9660 (MISCELLANEOUS) IMPLANT
BONE VIVIGEN FORMABLE 10CC (Bone Implant) ×3 IMPLANT
BUR PRESCISION 1.7 ELITE (BURR) IMPLANT
BUR ROUND PRECISION 4.0 (BURR) IMPLANT
BUR ROUND PRECISION 4.0MM (BURR)
BUR SABER RD CUTTING 3.0 (BURR) IMPLANT
BUR SABER RD CUTTING 3.0MM (BURR)
CAGE BULLET CONCORDE 9X9X27 (Cage) ×2 IMPLANT
CAGE BULLET CONCORDE 9X9X27MM (Cage) ×1 IMPLANT
CAGE CONCORDE BULLET 9X11X27 (Cage) ×2 IMPLANT
CAGE SPNL PRLL BLT NOSE 27X9 (Cage) ×1 IMPLANT
CARTRIDGE OIL MAESTRO DRILL (MISCELLANEOUS) ×1 IMPLANT
CLOSURE WOUND 1/2 X4 (GAUZE/BANDAGES/DRESSINGS) ×1
CONT SPEC STER OR (MISCELLANEOUS) IMPLANT
COVER SURGICAL LIGHT HANDLE (MISCELLANEOUS) ×3 IMPLANT
DECANTER SPIKE VIAL GLASS SM (MISCELLANEOUS) ×6 IMPLANT
DIFFUSER DRILL AIR PNEUMATIC (MISCELLANEOUS) ×3 IMPLANT
DRAIN CHANNEL 15F RND FF W/TCR (WOUND CARE) ×3 IMPLANT
DRAPE C-ARM 42X72 X-RAY (DRAPES) ×3 IMPLANT
DRAPE POUCH INSTRU U-SHP 10X18 (DRAPES) ×3 IMPLANT
DRAPE SURG 17X23 STRL (DRAPES) ×12 IMPLANT
DRSG MEPILEX BORDER 4X12 (GAUZE/BANDAGES/DRESSINGS) IMPLANT
DRSG MEPILEX BORDER 4X8 (GAUZE/BANDAGES/DRESSINGS) IMPLANT
DURAPREP 26ML APPLICATOR (WOUND CARE) ×3 IMPLANT
ELECT BLADE 4.0 EZ CLEAN MEGAD (MISCELLANEOUS) ×3
ELECT CAUTERY BLADE 6.4 (BLADE) ×6 IMPLANT
ELECT REM PT RETURN 9FT ADLT (ELECTROSURGICAL) ×3
ELECTRODE BLDE 4.0 EZ CLN MEGD (MISCELLANEOUS) ×1 IMPLANT
ELECTRODE REM PT RTRN 9FT ADLT (ELECTROSURGICAL) ×1 IMPLANT
EVACUATOR SILICONE 100CC (DRAIN) ×3 IMPLANT
FEE INTRAOP MONITOR IMPULS NCS (MISCELLANEOUS) ×1 IMPLANT
FILTER STRAW FLUID ASPIR (MISCELLANEOUS) ×6 IMPLANT
GAUZE SPONGE 4X4 12PLY STRL (GAUZE/BANDAGES/DRESSINGS) ×3 IMPLANT
GAUZE SPONGE 4X4 16PLY XRAY LF (GAUZE/BANDAGES/DRESSINGS) ×6 IMPLANT
GLOVE BIO SURGEON STRL SZ7 (GLOVE) ×3 IMPLANT
GLOVE BIO SURGEON STRL SZ8 (GLOVE) ×3 IMPLANT
GLOVE BIOGEL PI IND STRL 7.0 (GLOVE) ×1 IMPLANT
GLOVE BIOGEL PI IND STRL 8 (GLOVE) ×1 IMPLANT
GLOVE BIOGEL PI INDICATOR 7.0 (GLOVE) ×2
GLOVE BIOGEL PI INDICATOR 8 (GLOVE) ×2
GOWN STRL REUS W/ TWL LRG LVL3 (GOWN DISPOSABLE) ×2 IMPLANT
GOWN STRL REUS W/ TWL XL LVL3 (GOWN DISPOSABLE) ×1 IMPLANT
GOWN STRL REUS W/TWL LRG LVL3 (GOWN DISPOSABLE) ×4
GOWN STRL REUS W/TWL XL LVL3 (GOWN DISPOSABLE) ×2
INTRAOP MONITOR FEE IMPULS NCS (MISCELLANEOUS) ×1
INTRAOP MONITOR FEE IMPULSE (MISCELLANEOUS) ×2
IV CATH 14GX2 1/4 (CATHETERS) ×3 IMPLANT
KIT BASIN OR (CUSTOM PROCEDURE TRAY) ×3 IMPLANT
KIT POSITION SURG JACKSON T1 (MISCELLANEOUS) ×3 IMPLANT
KIT ROOM TURNOVER OR (KITS) ×3 IMPLANT
MARKER SKIN DUAL TIP RULER LAB (MISCELLANEOUS) ×3 IMPLANT
NEEDLE 22X1 1/2 (OR ONLY) (NEEDLE) ×3 IMPLANT
NEEDLE HYPO 25GX1X1/2 BEV (NEEDLE) ×3 IMPLANT
NEEDLE SPNL 18GX3.5 QUINCKE PK (NEEDLE) ×6 IMPLANT
NS IRRIG 1000ML POUR BTL (IV SOLUTION) ×12 IMPLANT
OIL CARTRIDGE MAESTRO DRILL (MISCELLANEOUS) ×3
PACK LAMINECTOMY ORTHO (CUSTOM PROCEDURE TRAY) ×3 IMPLANT
PACK UNIVERSAL I (CUSTOM PROCEDURE TRAY) ×3 IMPLANT
PAD ARMBOARD 7.5X6 YLW CONV (MISCELLANEOUS) ×6 IMPLANT
PATTIES SURGICAL .5 X1 (DISPOSABLE) ×3 IMPLANT
PATTIES SURGICAL .5 X3 (DISPOSABLE) IMPLANT
PATTIES SURGICAL .5X1.5 (GAUZE/BANDAGES/DRESSINGS) ×3 IMPLANT
PATTIES SURGICAL .75X.75 (GAUZE/BANDAGES/DRESSINGS) ×3 IMPLANT
PROBE PEDCLE PROBE MAGSTM DISP (MISCELLANEOUS) ×3 IMPLANT
ROD EXPEDIUM PER BENT 65MM (Rod) ×3 IMPLANT
ROD EXPEDIUM PRE BENT 55MM (Rod) ×3 IMPLANT
SCREW CORTICAL VIPER 7X40MM (Screw) ×6 IMPLANT
SCREW SET SINGLE INNER (Screw) ×18 IMPLANT
SCREW VIPER CORT FIX 6X35 (Screw) ×12 IMPLANT
SPONGE INTESTINAL PEANUT (DISPOSABLE) IMPLANT
SPONGE SURGIFOAM ABS GEL 100 (HEMOSTASIS) ×3 IMPLANT
STRIP CLOSURE SKIN 1/2X4 (GAUZE/BANDAGES/DRESSINGS) ×2 IMPLANT
SURGIFLO W/THROMBIN 8M KIT (HEMOSTASIS) IMPLANT
SUT MNCRL AB 4-0 PS2 18 (SUTURE) ×3 IMPLANT
SUT VIC AB 0 CT1 18XCR BRD 8 (SUTURE) ×2 IMPLANT
SUT VIC AB 0 CT1 8-18 (SUTURE) ×4
SUT VIC AB 1 CT1 18XCR BRD 8 (SUTURE) ×2 IMPLANT
SUT VIC AB 1 CT1 8-18 (SUTURE) ×4
SUT VIC AB 2-0 CT2 18 VCP726D (SUTURE) ×6 IMPLANT
SYR 20CC LL (SYRINGE) ×3 IMPLANT
SYR BULB IRRIGATION 50ML (SYRINGE) ×3 IMPLANT
SYR CONTROL 10ML LL (SYRINGE) ×6 IMPLANT
SYR TB 1ML LUER SLIP (SYRINGE) ×3 IMPLANT
SYRINGE 1CC SLIP TB (MISCELLANEOUS) ×6 IMPLANT
TAPE CLOTH SURG 4X10 WHT LF (GAUZE/BANDAGES/DRESSINGS) ×3 IMPLANT
TOWEL OR 17X24 6PK STRL BLUE (TOWEL DISPOSABLE) ×3 IMPLANT
TOWEL OR 17X26 10 PK STRL BLUE (TOWEL DISPOSABLE) ×3 IMPLANT
TRAY FOLEY CATH 16FRSI W/METER (SET/KITS/TRAYS/PACK) ×3 IMPLANT
WATER STERILE IRR 1000ML POUR (IV SOLUTION) IMPLANT
YANKAUER SUCT BULB TIP NO VENT (SUCTIONS) ×3 IMPLANT

## 2016-10-17 NOTE — Anesthesia Preprocedure Evaluation (Addendum)
Anesthesia Evaluation  Patient identified by MRN, date of birth, ID band Patient awake    Reviewed: Allergy & Precautions, NPO status , Patient's Chart, lab work & pertinent test results  History of Anesthesia Complications (+) PONV  Airway Mallampati: II  TM Distance: >3 FB     Dental   Pulmonary pneumonia, COPD, Current Smoker,    breath sounds clear to auscultation       Cardiovascular hypertension, + CAD and + Peripheral Vascular Disease   Rhythm:Regular Rate:Normal     Neuro/Psych    GI/Hepatic Neg liver ROS, GERD  ,  Endo/Other  negative endocrine ROS  Renal/GU negative Renal ROS     Musculoskeletal   Abdominal   Peds  Hematology   Anesthesia Other Findings   Reproductive/Obstetrics                             Anesthesia Physical Anesthesia Plan  ASA: III  Anesthesia Plan: General   Post-op Pain Management:    Induction: Intravenous  Airway Management Planned: Oral ETT  Additional Equipment:   Intra-op Plan:   Post-operative Plan:   Informed Consent: I have reviewed the patients History and Physical, chart, labs and discussed the procedure including the risks, benefits and alternatives for the proposed anesthesia with the patient or authorized representative who has indicated his/her understanding and acceptance.   Dental advisory given  Plan Discussed with: CRNA and Anesthesiologist  Anesthesia Plan Comments:         Anesthesia Quick Evaluation

## 2016-10-17 NOTE — Anesthesia Procedure Notes (Signed)
Procedure Name: Intubation Date/Time: 10/17/2016 7:35 AM Performed by: Jacquiline Doe A Pre-anesthesia Checklist: Patient identified, Emergency Drugs available, Suction available and Patient being monitored Patient Re-evaluated:Patient Re-evaluated prior to inductionOxygen Delivery Method: Circle System Utilized and Circle system utilized Preoxygenation: Pre-oxygenation with 100% oxygen Intubation Type: IV induction and Cricoid Pressure applied Ventilation: Mask ventilation without difficulty and Oral airway inserted - appropriate to patient size Laryngoscope Size: Mac and 4 Grade View: Grade II Tube type: Oral Tube size: 7.5 mm Number of attempts: 1 Airway Equipment and Method: Stylet and Oral airway Placement Confirmation: ETT inserted through vocal cords under direct vision,  positive ETCO2 and breath sounds checked- equal and bilateral Secured at: 23 cm Tube secured with: Tape Dental Injury: Teeth and Oropharynx as per pre-operative assessment

## 2016-10-17 NOTE — Anesthesia Postprocedure Evaluation (Signed)
Anesthesia Post Note  Patient: Sian Vallier Strohecker  Procedure(s) Performed: Procedure(s) (LRB): RIGHT SIDED LUMBAR 4-5, LUMBAR 5-SACRUM 1 TRANSFORAMINAL LUMBAR INTERBODY FUSION WITH INSTRUMENTATION AND ALLOGRAFT (Right)  Patient location during evaluation: PACU Anesthesia Type: General Level of consciousness: awake Pain management: pain level controlled Vital Signs Assessment: post-procedure vital signs reviewed and stable Respiratory status: spontaneous breathing Cardiovascular status: stable Anesthetic complications: no       Last Vitals:  Vitals:   10/17/16 0625 10/17/16 1435  BP: (!) 161/96 133/72  Pulse: (!) 102 (!) 111  Resp: 20 13  Temp: 36.8 C 36.3 C    Last Pain:  Vitals:   10/17/16 1435  PainSc: Asleep                 Gael Delude

## 2016-10-17 NOTE — Transfer of Care (Signed)
Immediate Anesthesia Transfer of Care Note  Patient: Brad Turner  Procedure(s) Performed: Procedure(s) with comments: RIGHT SIDED LUMBAR 4-5, LUMBAR 5-SACRUM 1 TRANSFORAMINAL LUMBAR INTERBODY FUSION WITH INSTRUMENTATION AND ALLOGRAFT (Right) - RIGHT SIDED LUMBAR 4-5, LUMBAR 5-SACRUM 1 TRANSFORAMINAL LUMBAR INTERBODY FUSION WITH INSTRUMENTATION AND ALLOGRAFT  Patient Location: PACU  Anesthesia Type:General  Level of Consciousness: awake, sedated, patient cooperative and responds to stimulation  Airway & Oxygen Therapy: Patient Spontanous Breathing and Patient connected to nasal cannula oxygen  Post-op Assessment: Report given to RN, Post -op Vital signs reviewed and stable, Patient moving all extremities and Patient moving all extremities X 4  Post vital signs: Reviewed and stable  Last Vitals:  Vitals:   10/17/16 0625 10/17/16 1435  BP: (!) 161/96 133/72  Pulse: (!) 102 (!) 111  Resp: 20 13  Temp: 36.8 C 36.3 C    Last Pain:  Vitals:   10/17/16 0624  PainSc: 3          Complications: No apparent anesthesia complications

## 2016-10-17 NOTE — Addendum Note (Signed)
Addendum  created 10/17/16 1613 by Fidela Juneau, CRNA   Anesthesia Intra Flowsheets edited

## 2016-10-18 LAB — GLUCOSE, CAPILLARY: Glucose-Capillary: 77 mg/dL (ref 65–99)

## 2016-10-18 MED FILL — Thrombin For Soln 20000 Unit: CUTANEOUS | Qty: 1 | Status: AC

## 2016-10-18 NOTE — Progress Notes (Signed)
    Patient doing OK with resolved pre-op R leg pain, expected PO LBP predominantly muscular with spasming. Pt reports he has not been out of bed once since surgery. He is urinating, has been eating. He is motivated to progress and D/C home as soon as possible.    Physical Exam: BP 122/68 (BP Location: Right Arm)   Pulse 81   Temp 98 F (36.7 C) (Oral)   Resp 16   SpO2 100%   Dressing in place, CDI, appears to have been replaced.  NVI, SCD's   POD #1 s/p L4-S1 Revision Decompression and Fusion   - Pt needs to be ambulated frequently, 2x per shift today per existing orders, needs to get up at least to chair as soon as possible. Preferably should have been ambulated yesterday per orders. Please ambulate pt minimum 2x shift - up with PT/OT, encourage ambulation - Percocet for pain, Valium for muscle spasms Q6 - likely d/c home today vs tomorrow pending pain control and PT clearance

## 2016-10-18 NOTE — Op Note (Signed)
NAMEAMUN, SCHEIBER NO.:  [id number]  MEDICAL RECORD NO.:  [id number]  LOCATION:                               FACILITY:  [facility]  PHYSICIAN:  Phylliss Bob, MD      DATE OF BIRTH:  Sep 26, 1940  DATE OF PROCEDURE:  10/17/2016                              OPERATIVE REPORT   PREOPERATIVE DIAGNOSES: 1. 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 2. 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 3. Severe bilateral neuroforaminal stenosis, L4-5, L5-S1.  POSTOPERATIVE DIAGNOSES: 1. 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 2. 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 3. Severe bilateral neuroforaminal stenosis, L4-5, L5-S1.  PROCEDURE: 1. Revision lumbar decompression, L4-5, L5-S1, including meticulous     decompression of the neuroforamen on the right at L4-5 and L5-S1,     also including a complex microdiskectomy procedure with removal of     right-sided L4-5 disk herniation occupying the right lateral     recess, foramen, and extraforaminal region. 2. Right-sided transforaminal lumbar interbody fusion, L4-5, L5-S1. 3. Left-sided posterolateral fusion, L4-5, L5-S1. 4. Placement of posterior segmental instrumentation, L4, L5, and S1. 5. Insertion of interbody device x2 (Concorde bullet cages, L4-5 and     L5-S1). 6. Use of local autograft. 7. Use of morselized allograft-ViviGen. 8. Intraoperative use of fluoroscopy.  SURGEON:  Phylliss Bob, MD  ASSISTANT:  Pricilla Holm, PA-C.  ANESTHESIA:  General endotracheal anesthesia.  COMPLICATIONS:  None.  DISPOSITION:  Stable.  ESTIMATED BLOOD LOSS:  400 mL with 150 mL re-transfused with cell saver.  INDICATIONS FOR SURGERY:  Briefly, Mr. Kolb is a pleasant 76 year old male, who did initially present to me with bilateral leg pain.  He did have a three-level decompression, spanning L3-S1.  He did  well with the decompression, but did have recurrence of right leg pain and weakness. The patient did fail multiple forms of conservative care, and did continue to have ongoing debilitating pain.  Given the patient's ongoing dysfunction, we did discuss proceeding with the procedure reflected above.  The patient was fully aware of the risks and limitations of surgery.  Of particular note, the patient is a smoker, and he does understand that smoking does increase his risk for a nonunion.  OPERATIVE DETAILS:  On October 17, 2016, the patient was brought to surgery and general endotracheal anesthesia was administered.  The patient was placed prone on a well-padded flat Jackson bed with a Wilson frame.  The back was prepped and draped.  Antibiotics were given and a time-out procedure was performed.  The patient's previous incision was reutilized, slightly extended both superiorly and inferiorly.  The fascia was incised at the midline.  There was abundant epidural scar identified.  I did dissect around the epidural scar, and the pars interarticularis of L4, L5, and S1 was identified and subperiosteally exposed.  The L4-5 and L5-S1 facet joints were exposed bilaterally and decorticated.  Using anatomic landmarks in addition to AP and lateral fluoroscopy, I did cannulate the L4, L5, and S1 pedicles.  At the L4 and L5 levels, a medial  to lateral cortical trajectory technique was utilized.  A standard lateral to medial technique was used at S1.  On the left side, I did place 6 x 35 mm screws into the L4 and L5 pedicles. A 7 x 40 mm screw was placed into the S1 pedicle on the left.  A 55 mm rod was placed.  Distraction was then applied across the rod and caps were placed and provisionally tightened.  On the right side, I did place bone wax in the cannulated pedicle holes.  I then turned my attention to the L5-S1 level.  I did proceed with a thorough and complete L5-S1 decompression, meticulously  decompressing the neuroforamen, where there was noted to be rather substantial compression of the exiting right L5 nerve.  I was very pleased with the decompression, however.  There was abundant scar tissue, which was teased away from the bone that was ultimately removed.  Then, with an assistant holding medial retraction of the traversing right S1 nerve, a thorough and complete L5-S1 intervertebral diskectomy was performed.  The endplates were then thoroughly prepared.  The appropriate size intervertebral spacer, 9 mm in height x 27 mm in length, was liberally packed with allograft and autograft and tamped into the intervertebral space, after liberally packing the intervertebral space with allograft and autograft as well. I was very pleased with the press-fit of the implant.  I was very pleased with the fluoroscopic images.  I then turned my attention to the L4-5 intervertebral level.  Once again, a plane was developed between the epidural scar and the remaining bone.  I was able to identify and expose the exiting right L4 nerve and the traversing right L5 nerve. There was noted to be a substantial disk herniation identified, occupying the lateral recess and extraforaminal region.  I did remove the herniation.  This portion of the procedure was extremely meticulous, given the fact that there was significant epidural scar identified in the region of the lateral recess, adherent to the traversing right L5 nerve.  I was, however, able to adequately remove the herniation and decompress both the traversing L5 nerve and the exiting L4 nerve.  Then, from the right side, an annulotomy was performed and I did perform a thorough and complete L4-5 intervertebral diskectomy.  The endplates were then prepared, and then I did liberally pack the intervertebral space with allograft and autograft, after which point the intervertebral spacer, 11 mm in height x 27 mm in length, was also packed  with allograft and autograft and tamped into position in the usual fashion. I was very pleased with the press-fit of the implant.  Of note, I did liberally irrigate the wound throughout the surgery with a total of approximately 3 L of normal saline.  At this point, the distraction was discontinued on the contralateral left side and the caps were then retightened.  I then packed allograft and autograft into the posterolateral gutter on the left across the L4-5 and L5-S1 levels.  I was very pleased with the final AP and lateral fluoroscopic images.  Of note, I did use neurologic monitoring throughout the surgery, and there was no abnormal EMG activity noted throughout the surgery.  The wound was then explored for any undue bleeding and there was no significant bleeding encountered.  The wound was then closed in layers using #1 Vicryl followed by 2-0 Vicryl, followed by 4-0 Monocryl.  Benzoin and Steri-Strips were applied followed by sterile dressing.  All instrument counts were correct.  Of note,  Pricilla Holm was my assistant throughout the surgery, and did aid in retraction, suctioning, and closure from start to finish.     Phylliss Bob, MD   ______________________________ Phylliss Bob, MD    MD/MEDQ  D:  10/17/2016  T:  10/18/2016  Job:  [id number]

## 2016-10-18 NOTE — Evaluation (Signed)
Physical Therapy Evaluation Patient Details Name: Brad Turner MRN: [id number] DOB: [birth year] Today's Date: 10/18/2016   History of Present Illness  Pt is a 76 yo male admitted on 10/17/16 for right sided L4-L5, L5-S1 transforaminal interverterbal fusion. PMH significant for HTN, neuropathy, CAD, BPH, COPD, Emphysema, Fall 10/06/16 resulting in brusing under eyes, GERD, macular degeneration, OA.   Clinical Impression  Pt is POD 1 and limited significantly by pain. Pt requires assistance to perform bed mobs and transfers this session and is unable to perform gait due to pain. Assisted and instructed pt on proper donning of brace. Pt is able to stand pivot to recliner this session but is very uncomfortable in his seat but is agreeable to sit up for at least 30 minutes. Prior to admission, pt was completely independent and has a small dog at home. Pt would like to return to home but with current mobility, this would not be appropriate. Pt will need to improve with gait and transfers and have more frequent assistance to safely return home. Pt will benefit from continued acute PT services to address below deficits prior to discharge.     Follow Up Recommendations SNF    Equipment Recommendations  Rolling walker with 5" wheels;3in1 (PT)    Recommendations for Other Services       Precautions / Restrictions Precautions Precautions: Fall;Back Precaution Booklet Issued: Yes (comment) Precaution Comments: Had a fall 10/06/16 , handout given and reviewed Required Braces or Orthoses: Spinal Brace Spinal Brace: Thoracolumbosacral orthotic;Applied in sitting position;Applied in standing position Restrictions Weight Bearing Restrictions: No      Mobility  Bed Mobility Overal bed mobility: Needs Assistance Bed Mobility: Rolling;Sidelying to Sit Rolling: Supervision Sidelying to sit: Min assist       General bed mobility comments: Min A to bring trunk upright at EOB  Transfers Overall transfer  level: Needs assistance Equipment used: Rolling walker (2 wheeled) Transfers: Sit to/from [facility] Sit to Stand: Min assist Stand pivot transfers: Min assist       General transfer comment: Min A for safety with cues for hand placement and for proper positioning in front of the chair before sitting down  Ambulation/Gait             General Gait Details: unable to perform this session due to pain  Stairs            Wheelchair Mobility    Modified Rankin (Stroke Patients Only)       Balance Overall balance assessment: Needs assistance Sitting-balance support: No upper extremity supported;Feet supported Sitting balance-Leahy Scale: Good     Standing balance support: Bilateral upper extremity supported Standing balance-Leahy Scale: Poor                               Pertinent Vitals/Pain Pain Assessment: 0-10 Pain Score: 8  Pain Location: low back  Pain Descriptors / Indicators: Sharp;Shooting;Stabbing;Throbbing Pain Intervention(s): Limited activity within patient's tolerance;Monitored during session;Repositioned;Premedicated before session;Patient requesting pain meds-RN notified;RN gave pain meds during session;Ice applied    Home Living Family/patient expects to be discharged to:: Private residence Living Arrangements: Alone;Other (Comment) (has a puppy at home) Available Help at Discharge: Family;Available PRN/intermittently Type of Home: House Home Access: Stairs to enter Entrance Stairs-Rails: None Entrance Stairs-Number of Steps: 2 Home Layout: Two level;Bed/bath upstairs;1/2 bath on main level Home Equipment: None      Prior Function Level of Independence: Independent  Comments: was completely independent, retired Corporate treasurer professor, 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      Hand Dominance   Dominant Hand: Right    Extremity/Trunk Assessment   Upper Extremity Assessment Upper Extremity  Assessment: Defer to OT evaluation    Lower Extremity Assessment Lower Extremity Assessment: Generalized weakness       Communication   Communication: No difficulties  Cognition Arousal/Alertness: Awake/alert Behavior During Therapy: WFL for tasks assessed/performed Overall Cognitive Status: Within Functional Limits for tasks assessed                      General Comments      Exercises     Assessment/Plan    PT Assessment Patient needs continued PT services  PT Problem List Decreased strength;Decreased activity tolerance;Decreased balance;Decreased mobility;Decreased knowledge of use of DME;Pain          PT Treatment Interventions DME instruction;Gait training;Stair training;Functional mobility training;Therapeutic activities;Therapeutic exercise;Balance training;Patient/family education    PT Goals (Current goals can be found in the Care Plan section)  Acute Rehab PT Goals Patient Stated Goal: to go home PT Goal Formulation: With patient Time For Goal Achievement: 10/25/16 Potential to Achieve Goals: Good    Frequency Min 5X/week   Barriers to discharge Decreased caregiver support No caregiver available    Co-evaluation               End of Session Equipment Utilized During Treatment: Gait belt;Back brace Activity Tolerance: Patient limited by pain Patient left: in chair;with call bell/phone within reach Nurse Communication: Patient requests pain meds         Time: 1023-1101 PT Time Calculation (min) (ACUTE ONLY): 38 min   Charges:   PT Evaluation $PT Eval Moderate Complexity: 1 Procedure PT Treatments $Therapeutic Activity: 23-37 mins   PT G Codes:        Scheryl Marten PT, DPT  [phone]  10/18/2016, 2:11 PM

## 2016-10-18 NOTE — Procedures (Addendum)
Spo2 72% on RA.  Pt placed on 6L [place] & instructed to take deep breaths, spo2 quickly recovered, decreased to 2L [place] at this time, spo2 96%

## 2016-10-18 NOTE — Plan of Care (Signed)
Problem: Safety: Goal: Ability to remain free from injury will improve Outcome: Progressing Safety precautions maintained  Problem: Tissue Perfusion: Goal: Risk factors for ineffective tissue perfusion will decrease Outcome: Progressing No S/S of DVT noted, SCDs are on   Problem: Nutrition: Goal: Adequate nutrition will be maintained Outcome: Not Progressing Ate poorly about 10% of meal   Problem: Activity: Goal: Ability to return to normal activity level will improve Outcome: Not Progressing Difficulty with turning and repositioning in the bed  Problem: Bowel/Gastric: Goal: Gastrointestinal status for postoperative course will improve Outcome: Progressing Denies gastric and bowel issues  Problem: Physical Regulation: Goal: Postoperative complications will be avoided or minimized Outcome: Progressing No post op. Complications noted  Problem: Respiratory: Goal: Ability to achieve and maintain a regular respiratory rate will improve Outcome: Progressing No respiratory issues noted noted, O2 at 2 l via , sats 97%  Problem: Pain Management: Goal: General experience of comfort will improve Outcome: Progressing Medicated twice for pain tonight , resting in the bed with eyes closed at present time  Problem: Skin Integrity: Goal: Demonstration of wound healing without infection will improve Outcome: Progressing No skin issues noted other than the back incision

## 2016-10-18 NOTE — Progress Notes (Signed)
Patient ambulated 50 feet in his room with nurse. Patient sat down halfway to rest but continued to make it from the bed to the door and back to the bed

## 2016-10-18 NOTE — Evaluation (Signed)
Occupational Therapy Evaluation Patient Details Name: Brad Turner MRN: [id number] DOB: 19-Nov-1940 Today's Date: 10/18/2016    History of Present Illness Pt is a 76 yo male admitted on 10/17/16 for right sided L4-L5, L5-S1 transforaminal interverterbal fusion. PMH significant for HTN, neuropathy, CAD, BPH, COPD, Emphysema, Fall 10/06/16 resulting in brusing under eyes, GERD, macular degeneration, OA.    Clinical Impression   PTA Pt independent in ADL and mobility. Pt currently max A for ADL and mod A for mobility with RW. This session limited as Pt was in severe pain. Pt not able to recall any precautions (OT reviewed back handout). Pt educated on don/doff of brace and safe bed mobility. Pt will benefit from skilled OT in the acute setting to maximize independence and safety in ADL and functional transfers. Pt will currently require SNF level therapy upon dc to return to PLOF. Pt is anxious to get home to his puppy. Next session to focus on AE education for LB bathing/dressing/peri care.     Follow Up Recommendations  SNF;Supervision/Assistance - 24 hour (initially)    Equipment Recommendations  3 in 1 bedside commode;Other (comment) (AE kit)    Recommendations for Other Services       Precautions / Restrictions Precautions Precautions: Fall;Back Precaution Booklet Issued: Yes (comment) Precaution Comments: Pt not able to recall precautions. Pt re-educated in precautions Required Braces or Orthoses: Spinal Brace Spinal Brace: Thoracolumbosacral orthotic;Applied in sitting position Restrictions Weight Bearing Restrictions: No      Mobility Bed Mobility Overal bed mobility: Needs Assistance Bed Mobility: Rolling;Sit to Sidelying Rolling: Supervision Sidelying to sit: Min assist     Sit to sidelying: Mod assist General bed mobility comments: vc for sequencing and coming down on elbow, mod A to bring legs into bed  Transfers Overall transfer level: Needs assistance Equipment  used: Rolling walker (2 wheeled) Transfers: Sit to/from Stand Sit to Stand: Min assist Stand pivot transfers: Min assist       General transfer comment: vc for safe hand placement (slightly impulsive) and Pt moaning in pain during transfer    Balance Overall balance assessment: Needs assistance Sitting-balance support: No upper extremity supported;Feet supported Sitting balance-Leahy Scale: Good     Standing balance support: Bilateral upper extremity supported Standing balance-Leahy Scale: Poor                              ADL Overall ADL's : Needs assistance/impaired     Grooming: Set up;Sitting   Upper Body Bathing: Moderate assistance;Sitting   Lower Body Bathing: Maximal assistance   Upper Body Dressing : Moderate assistance   Lower Body Dressing: Maximal assistance;Sit to/from stand   Toilet Transfer: Maximal assistance;BSC;RW;Stand-pivot Toilet Transfer Details (indicate cue type and reason): simulated with transfer from recliner to bed         Functional mobility during ADLs: Moderate assistance;Rolling walker;Cueing for sequencing General ADL Comments: Pt extremely limited by pain this session     Vision Vision Assessment?: No apparent visual deficits   Perception     Praxis      Pertinent Vitals/Pain Pain Assessment: 0-10 Pain Score: 8  Pain Location: low back  Pain Descriptors / Indicators: Sharp;Shooting;Stabbing;Throbbing;Moaning Pain Intervention(s): Limited activity within patient's tolerance;Repositioned;Monitored during session (Pt able to have pain meds in 20 min)     Hand Dominance Right   Extremity/Trunk Assessment Upper Extremity Assessment Upper Extremity Assessment: Generalized weakness   Lower Extremity Assessment Lower Extremity Assessment:  Generalized weakness   Cervical / Trunk Assessment Cervical / Trunk Assessment: Other exceptions Cervical / Trunk Exceptions: s/p back surgery   Communication  Communication Communication: No difficulties   Cognition Arousal/Alertness: Awake/alert Behavior During Therapy: WFL for tasks assessed/performed;Impulsive Overall Cognitive Status: Within Functional Limits for tasks assessed                     General Comments       Exercises       Shoulder Instructions      Home Living Family/patient expects to be discharged to:: Private residence Living Arrangements: Alone;Other (Comment) (has pit bull puppy at home) Available Help at Discharge: Family;Available PRN/intermittently Type of Home: House Home Access: Stairs to enter [employer] of Steps: 2 Entrance Stairs-Rails: None Home Layout: Two level;Bed/bath upstairs;1/2 bath on main level Alternate Level Stairs-Number of Steps: 14 Alternate Level Stairs-Rails: Right;Left Bathroom Shower/Tub: Occupational psychologist: Standard Bathroom Accessibility: Yes How Accessible: Accessible via walker Home Equipment: None          Prior Functioning/Environment Level of Independence: Independent        Comments: was completely independent, retired Corporate treasurer professor, 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         OT Problem List: Decreased strength;Decreased range of motion;Decreased activity tolerance;Impaired balance (sitting and/or standing);Decreased safety awareness;Decreased knowledge of use of DME or AE;Decreased knowledge of precautions;Pain   OT Treatment/Interventions: Self-care/ADL training;DME and/or AE instruction;Therapeutic activities;Patient/family education;Balance training    OT Goals(Current goals can be found in the care plan section) Acute Rehab OT Goals Patient Stated Goal: to go home OT Goal Formulation: With patient Time For Goal Achievement: 10/25/16 Potential to Achieve Goals: Fair ADL Goals Pt Will Perform Grooming: with modified independence;standing Pt Will Perform Upper Body Dressing: with modified  independence;sitting Pt Will Perform Lower Body Dressing: with modified independence;with adaptive equipment;sit to/from stand Pt Will Transfer to Toilet: with modified independence;ambulating;bedside commode (with RW) Pt Will Perform Toileting - Clothing Manipulation and hygiene: with modified independence;sit to/from stand;with adaptive equipment  OT Frequency: Min 3X/week   Barriers to D/C: Decreased caregiver support  Pt lives alone and has to care for puppy (lots of bending)       Co-evaluation              End of Session Equipment Utilized During Treatment: Gait belt;Rolling walker;Back brace Nurse Communication: Mobility status;Patient requests pain meds (RN stated pain meds were due in 20 min)  Activity Tolerance: Patient limited by pain Patient left: in bed;with call bell/phone within reach;with SCD's reapplied   Time: 2257-5051 OT Time Calculation (min): 23 min Charges:  OT General Charges $OT Visit: 1 Procedure OT Evaluation $OT Eval Moderate Complexity: 1 Procedure OT Treatments $Self Care/Home Management : 8-22 mins G-Codes:    Merri Ray Therese Rocco 11-14-16, 4:20 PM  Hulda Humphrey OTR/L [phone]

## 2016-10-18 NOTE — Op Note (Deleted)
  The note originally documented on this encounter has been moved the the encounter in which it belongs.  

## 2016-10-19 NOTE — Discharge Summary (Signed)
Patient ID: Brad Turner MRN: [id number] DOB/AGE: 04-06-1941 76 y.o.  Admit date: 10/17/2016 Discharge date: 10/20/2016  Admission Diagnoses:  Active Problems:   Radiculopathy   Discharge Diagnoses:  Same  Past Medical History:  Diagnosis Date  . Allergy    seasonal  . Arthritis   . Barrett esophagus   . BPH (benign prostatic hyperplasia)   . Bronchitis    recently   . Bruise    forehead and under both eyes  . CAD (coronary artery disease)    takes Plavix and Aspirin daily  . Chronic back pain   . COPD (chronic obstructive pulmonary disease) (HCC)    Albuterol inhaler as needed.Uses Symbicort daily  . Depression    doesn't take any meds  . Emphysema lung ([place])   . Fall    fell on 10/06/16 with loss of consiousness for a short time.States he remembers getting up but remembers nothing else.  Marland Kitchen GERD (gastroesophageal reflux disease)    takes Omeprazole daily  . History of colon polyps    benign  . History of kidney stones   . Hyperlipidemia    takes Fenofibrate and Lipitor daily  . Hypertension    takes HCTZ and Diovan daily  . Joint pain   . Joint swelling   . 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 Neuropathy ([place])    Lyrica every couple of days.  . Nocturia   . Pneumonia    hx of  . PONV (postoperative nausea and vomiting)   . Skin cancer   . Stroke [facility]) 1995   Affected balance; and has very emotional if watching a movie  . Syncope    yr ago  . Urgency of urination    takes Myrbetriq daily    Surgeries: Procedure(s): RIGHT SIDED LUMBAR 4-5, LUMBAR 5-SACRUM 1 TRANSFORAMINAL LUMBAR INTERBODY FUSION WITH INSTRUMENTATION AND ALLOGRAFT on 10/17/2016   Consultants:   Discharged Condition: Improved  Hospital Course: Brad Turner is an 76 y.o. male who was admitted 10/17/2016 for operative treatment of<principal problem not specified>. Patient has severe unremitting pain that affects sleep, daily activities, and  work/hobbies. After pre-op clearance the patient was taken to the operating room on 10/17/2016 and underwent  Procedure(s): RIGHT SIDED LUMBAR 4-5, LUMBAR 5-SACRUM 1 TRANSFORAMINAL LUMBAR INTERBODY FUSION WITH INSTRUMENTATION AND ALLOGRAFT.    Patient was given perioperative antibiotics: Anti-infectives    Start     Dose/Rate Route Frequency Ordered Stop   10/17/16 1600  ceFAZolin (ANCEF) IVPB 2g/100 mL premix     2 g 200 mL/hr over 30 Minutes Intravenous Every 8 hours 10/17/16 1553 10/17/16 2347   10/17/16 0531  ceFAZolin (ANCEF) IVPB 2g/100 mL premix     2 g 200 mL/hr over 30 Minutes Intravenous On call to O.R. 10/17/16 0531 10/17/16 1205       Patient was given sequential compression devices, early ambulation, and chemoprophylaxis to prevent DVT.  Patient benefited maximally from hospital stay and there were no complications.    Recent vital signs: Patient Vitals for the past 24 hrs:  BP Temp Temp src Pulse Resp SpO2  10/19/16 0831 - - - - - 100 %  10/19/16 0527 (!) 146/66 98.6 F (37 C) Axillary 81 - 96 %  10/19/16 0000 (!) 127/58 99.1 F (37.3 C) Oral 92 - 92 %  10/18/16 2028 - - - - - 100 %  10/18/16 1951 130/64 97.8 F (36.6 C)  Oral 97 - 100 %  10/18/16 1348 110/76 98.1 F (36.7 C) Oral 78 18 96 %  10/18/16 1209 - - - - - 96 %     Recent laboratory studies: No results for input(s): WBC, HGB, HCT, PLT, NA, K, CL, CO2, BUN, CREATININE, GLUCOSE, INR, CALCIUM in the last 72 hours.  Invalid input(s): PT, 2   Discharge Medications:   Allergies as of 10/19/2016      Reactions   Ivp Dye [iodinated Diagnostic Agents] Hives, Shortness Of Breath   reaction was when pt was 76 years old.   Macrodantin [nitrofurantoin] Other (See Comments)   Fever that lasted several months      Medication List    TAKE these medications   acetaminophen 500 MG tablet Commonly known as:  TYLENOL Take 1,000 mg by mouth every 6 (six) hours as needed (for back pain.).   albuterol 108 (90 Base)  MCG/ACT inhaler Commonly known as:  PROVENTIL HFA;VENTOLIN HFA Inhale 2 puffs into the lungs every 6 (six) hours as needed for wheezing or shortness of breath. Reported on 11/29/2015   atorvastatin 80 MG tablet Commonly known as:  LIPITOR Take 80 mg by mouth daily.   fenofibrate 145 MG tablet Commonly known as:  TRICOR Take 145 mg by mouth daily.   folic acid A999333 MCG tablet Commonly known as:  FOLVITE Take 400 mcg by mouth daily.   hydrochlorothiazide 12.5 MG tablet Commonly known as:  HYDRODIURIL Take 12.5 mg by mouth daily.   LYRICA 75 MG capsule Generic drug:  pregabalin Take 75 mg by mouth daily.   MYRBETRIQ 50 MG Tb24 tablet Generic drug:  mirabegron ER Take 50 mg by mouth daily.   NITROSTAT 0.4 MG SL tablet Generic drug:  nitroGLYCERIN Place 0.4 mg under the tongue every 5 (five) minutes as needed for chest pain. Reported on 11/29/2015   omeprazole 40 MG capsule Commonly known as:  PRILOSEC TAKE ONE CAPSULE BY MOUTH TWICE DAILY   SYMBICORT 160-4.5 MCG/ACT inhaler Generic drug:  budesonide-formoterol INHALE TWO PUFFS INTO LUNGS TWICE DAILY   tadalafil 5 MG tablet Commonly known as:  CIALIS Take 5 mg by mouth daily. For BPH   valsartan 160 MG tablet Commonly known as:  DIOVAN Take 160 mg by mouth daily.       Diagnostic Studies: Dg Lumbar Spine 2-3 Views  Result Date: 10/17/2016 CLINICAL DATA:  L4-S1 TLIF EXAM: LUMBAR SPINE - 1 VIEW; DG C-ARM 61-120 MIN; LUMBAR SPINE - 2-3 VIEW COMPARISON:  None. FINDINGS: Initial cross-table radiograph shows localization of the L5-S1 disc space. 2 fluoroscopic images obtained during the fusion per seizure extruded bilateral transpedicular screws at L4, L5 and S1. Fluoroscopy time was reported as 1 minutes 6 seconds. IMPRESSION: Pre- and intraoperative images during TLIF at L4-S1. Electronically Signed   By: Ulyses Jarred M.D.   On: 10/17/2016 14:03   Dg Lumbar Spine 1 View  Result Date: 10/17/2016 CLINICAL DATA:  L4-S1 TLIF  EXAM: LUMBAR SPINE - 1 VIEW; DG C-ARM 61-120 MIN; LUMBAR SPINE - 2-3 VIEW COMPARISON:  None. FINDINGS: Initial cross-table radiograph shows localization of the L5-S1 disc space. 2 fluoroscopic images obtained during the fusion per seizure extruded bilateral transpedicular screws at L4, L5 and S1. Fluoroscopy time was reported as 1 minutes 6 seconds. IMPRESSION: Pre- and intraoperative images during TLIF at L4-S1. Electronically Signed   By: Ulyses Jarred M.D.   On: 10/17/2016 14:03   Dg C-arm 61-120 Min  Result Date: 10/17/2016 CLINICAL DATA:  L4-S1 TLIF EXAM: LUMBAR SPINE - 1 VIEW; DG C-ARM 61-120 MIN; LUMBAR SPINE - 2-3 VIEW COMPARISON:  None. FINDINGS: Initial cross-table radiograph shows localization of the L5-S1 disc space. 2 fluoroscopic images obtained during the fusion per seizure extruded bilateral transpedicular screws at L4, L5 and S1. Fluoroscopy time was reported as 1 minutes 6 seconds. IMPRESSION: Pre- and intraoperative images during TLIF at L4-S1. Electronically Signed   By: Ulyses Jarred M.D.   On: 10/17/2016 14:03    Disposition: 01-Home or Self Care  Discharge Instructions    Call MD / Call 911    Complete by:  As directed    If you experience chest pain or shortness of breath, CALL 911 and be transported to the hospital emergency room.  If you develope a fever above 101 F, pus (white drainage) or increased drainage or redness at the wound, or calf pain, call your surgeon's office.   Constipation Prevention    Complete by:  As directed    Drink plenty of fluids.  Prune juice may be helpful.  You may use a stool softener, such as Colace (over the counter) 100 mg twice a [place].  Use MiraLax (over the counter) for constipation as needed.   Diet - low sodium heart healthy    Complete by:  As directed    Increase activity slowly as tolerated    Complete by:  As directed          Signed: Jaryan Chicoine, Larwance Sachs 10/19/2016, 8:37 AM

## 2016-10-19 NOTE — Progress Notes (Signed)
Occupational Therapy Treatment Patient Details Name: Tramell Piechota Reeves MRN: [id number] DOB: Jan 20, 1941 Today's Date: 10/19/2016    History of present illness Pt is a 76 yo male admitted on 10/17/16 for right sided L4-L5, L5-S1 transforaminal interverterbal fusion. PMH significant for HTN, neuropathy, CAD, BPH, COPD, Emphysema, Fall 10/06/16 resulting in brusing under eyes, GERD, macular degeneration, OA.    OT comments  Pt demonstrating mild confusion and inability to perform ADL at Modified independent level at this time. Pt unable to recall ANY precautions from previous OT or PT sessions. Please see ADL section below. Pt educated in [place], and practiced with each piece of equipment. OT STRONGLY FEELS THAT THIS PT REQUIRES SNF LEVEL THERAPY to be safe and successful upon dc from acute setting. A VERY significant amount of time was spent educating, and attempting to let the Pt demonstrate the ability to perform ADL - without success.    Follow Up Recommendations  SNF    Equipment Recommendations  3 in 1 bedside commode;Other (comment) (defer to next venue)    Recommendations for Other Services      Precautions / Restrictions Precautions Precautions: Fall;Back Precaution Booklet Issued: Yes (comment) Precaution Comments: Pt unable to recall any precautions; reviewed 3/3 Required Braces or Orthoses: Spinal Brace Spinal Brace: Thoracolumbosacral orthotic;Applied in sitting position Restrictions Weight Bearing Restrictions: No       Mobility Bed Mobility               General bed mobility comments: Pt sitting EOB when OT entered the room  Transfers Overall transfer level: Needs assistance Equipment used: Rolling walker (2 wheeled) Transfers: Sit to/from Stand Sit to Stand: Min assist         General transfer comment: assist to stabilize RW; cues for safe hand placement and technique but pt tends to pull on RW to stand    Balance Overall balance assessment: Needs  assistance Sitting-balance support: No upper extremity supported;Feet supported Sitting balance-Leahy Scale: Good     Standing balance support: Bilateral upper extremity supported Standing balance-Leahy Scale: Poor Standing balance comment: reliant on RW, but needed cues for safety                   ADL Overall ADL's : Needs assistance/impaired Eating/Feeding: Set up;Sitting Eating/Feeding Details (indicate cue type and reason): at the end of session, Pt sitting in recliner eating lunch with no problems Grooming: Set up;Sitting;Wash/dry hands Grooming Details (indicate cue type and reason): sitting EOB Upper Body Bathing: Moderate assistance;Sitting   Lower Body Bathing: Maximal assistance;With adaptive equipment;Sitting/lateral leans Lower Body Bathing Details (indicate cue type and reason): Pt educated in use of long handle sponge Upper Body Dressing : Moderate assistance;Cueing for sequencing;Cueing for compensatory techniques;Cueing for safety Upper Body Dressing Details (indicate cue type and reason): Pt UNABLE to don/doff brace independently. Pt attempted to put brace on backwards and couldn't problem solve how to put it on. Lower Body Dressing: Maximal assistance;Sit to/from stand;With adaptive equipment Lower Body Dressing Details (indicate cue type and reason): Pt educated in long shoe horn, and practiced donning "pants" with grabber/reacher and sock with sock donner. Pt required mod assist for these activities and was reluctant to try them. Toilet Transfer: Minimal assistance;Ambulation;BSC;RW Toilet Transfer Details (indicate cue type and reason): simulated with transfer from bed to recliner, declined therapy offer to go into bathroom and opted to use the urinal sitting on the EOB Toileting- Clothing Manipulation and Hygiene: Maximal assistance;With adaptive equipment;Sit to/from stand [place]  Manipulation Details (indicate cue type and reason): educated on  toileting aide     Functional mobility during ADLs: Minimal assistance;Rolling walker;Cueing for sequencing;Cueing for safety General ADL Comments: Pt continues to be limited by pain, and continues to demonstrate inability to perform ADL at a modified independent level, which would be necessary for him to return home alone.      Vision                     Perception     Praxis      Cognition   Behavior During Therapy: [facility] for tasks assessed/performed Overall Cognitive Status: Difficult to assess                  General Comments: Pt making questionable comments during session, displaying mild confusion. When OT asked about how he was going to feed his dog he stated "with miralax" and when OT asked if he remembered the session from yesterday he stated "You got that stamp on your forehead". Pt attempted to put his brace on backwards and could not figure out how to fix it. Pt has a joking personality at baseline so it was sometimes difficult to decifer what was confusion, and what was him trying to be funny    Extremity/Trunk Assessment               Exercises     Shoulder Instructions       General Comments      Pertinent Vitals/ Pain       Pain Assessment: 0-10 Pain Score: 9  Pain Location: low back  Pain Descriptors / Indicators: Grimacing;Guarding;Sore;Throbbing Pain Intervention(s): Limited activity within patient's tolerance;Monitored during session;Repositioned;RN gave pain meds during session  Home Living                                          Prior Functioning/Environment              Frequency  Min 3X/week        Progress Toward Goals  OT Goals(current goals can now be found in the care plan section)  Progress towards OT goals: Not progressing toward goals - comment  Acute Rehab OT Goals Patient Stated Goal: to get home to dog OT Goal Formulation: With patient Time For Goal Achievement: 10/25/16 Potential  to Achieve Goals: [place] Discharge plan remains appropriate;Frequency remains appropriate    Co-evaluation                 End of Session Equipment Utilized During Treatment: Gait belt;Rolling walker;Back brace   Activity Tolerance Patient limited by pain   Patient Left in chair;with call bell/phone within reach;with family/visitor present   Nurse Communication Mobility status;Precautions (safety concerns for Pt going home; mild confusion)        Time: 4142-3953 OT Time Calculation (min): 67 min  Charges: OT General Charges $OT Visit: 1 Procedure OT Treatments $Self Care/Home Management : 53-67 mins  Merri Ray Valorie Mcgrory 10/19/2016, 2:27 PM Hulda Humphrey OTR/L [phone]

## 2016-10-19 NOTE — Progress Notes (Signed)
Patient has intermittent confusion. Patient worked with occupational therapy and could not remember her, stated that she had a stamp on her forehead yesterday of grass.

## 2016-10-19 NOTE — Progress Notes (Signed)
Patient just ambulated in the hall with walker and 2 staff members.  He ambulated about 150 feet in the hallway.  He did have to rest in the wheel chair once when leaving the room but was able to walk back to the room with out resting.

## 2016-10-19 NOTE — Progress Notes (Signed)
Subjective: 2 Days Post-Op Procedure(s) (LRB): RIGHT SIDED LUMBAR 4-5, LUMBAR 5-SACRUM 1 TRANSFORAMINAL LUMBAR INTERBODY FUSION WITH INSTRUMENTATION AND ALLOGRAFT (Right)    Activity level:  WBAT Diet tolerance:  OK Voiding:  OK Patient reports pain as mild and moderate.    Objective: Vital signs in last 24 hours: Temp:  [97.8 F (36.6 C)-99.1 F (37.3 C)] 98.6 F (37 C) (02/17 0527) Pulse Rate:  [78-97] 81 (02/17 0527) Resp:  [18] 18 (02/16 1348) BP: (110-146)/(58-76) 146/66 (02/17 0527) SpO2:  [92 %-100 %] 100 % (02/17 0831)  Labs: No results for input(s): HGB in the last 72 hours. No results for input(s): WBC, RBC, HCT, PLT in the last 72 hours. No results for input(s): NA, K, CL, CO2, BUN, CREATININE, GLUCOSE, CALCIUM in the last 72 hours. No results for input(s): LABPT, INR in the last 72 hours.  Physical Exam:  Neurologically intact ABD soft Neurovascular intact Sensation intact distally Intact pulses distally Dorsiflexion/Plantar flexion intact Incision: dressing C/D/I and no drainage No cellulitis present Compartment soft  Assessment/Plan:  2 Days Post-Op Procedure(s) (LRB): RIGHT SIDED LUMBAR 4-5, LUMBAR 5-SACRUM 1 TRANSFORAMINAL LUMBAR INTERBODY FUSION WITH INSTRUMENTATION AND ALLOGRAFT (Right) Advance diet Up with therapy Discharge home with home health today if doing well and cleared by PT. Continue current pain meds and muscle relaxors. Follow up with Dr. Lynann Bologna post op as scheduled.  Sheddrick Lattanzio, Larwance Sachs 10/19/2016, 8:33 AM

## 2016-10-19 NOTE — Progress Notes (Signed)
Physical Therapy Treatment Patient Details Name: Brad Turner MRN: [id number] DOB: 28-Jul-1941 Today's Date: 10/19/2016    History of Present Illness Pt is a 76 yo male admitted on 10/17/16 for right sided L4-L5, L5-S1 transforaminal interverterbal fusion. PMH significant for HTN, neuropathy, CAD, BPH, COPD, Emphysema, Fall 10/06/16 resulting in brusing under eyes, GERD, macular degeneration, OA.     PT Comments    Patient is progressing with gait however pt does continues to require mod cues for safety and required seated rest break due to bilat LE weakness. Pt required assistance to donn TLSO as well as maintain back precautions with bed mobility. Pt recalled 1/3 precautions and 3/3 reviewed with pt. Continue to recommend SNF for further skilled PT services to maximize independence and safety with mobility as pt will have very little intermittent assistance at home.   Follow Up Recommendations  SNF     Equipment Recommendations  Rolling walker with 5" wheels;3in1 (PT)    Recommendations for Other Services       Precautions / Restrictions Precautions Precautions: Fall;Back Precaution Booklet Issued: Yes (comment) Precaution Comments: pt recalled 1/3 precautions; reviewed 3/3 Required Braces or Orthoses: Spinal Brace Spinal Brace: Thoracolumbosacral orthotic;Applied in sitting position;Applied in standing position Restrictions Weight Bearing Restrictions: No    Mobility  Bed Mobility Overal bed mobility: Needs Assistance Bed Mobility: Sidelying to Sit;Rolling;Sit to Sidelying Rolling: Supervision Sidelying to sit: Min assist     Sit to sidelying: Min assist General bed mobility comments: mod cues for sequnecing and technique with cues to maintain back precautions  Transfers Overall transfer level: Needs assistance Equipment used: Rolling walker (2 wheeled) Transfers: Sit to/from Stand Sit to Stand: Min assist         General transfer comment: assist to stabilize RW;  cues for safe hand placement and technique but pt tends to pull on RW to stand  Ambulation/Gait Ambulation/Gait assistance: Min assist Ambulation Distance (Feet):  (66ft then 40ft) Assistive device: Rolling walker (2 wheeled) Gait Pattern/deviations: Step-through pattern;Decreased stride length;Shuffle;Trunk flexed Gait velocity: decreased   General Gait Details: required seated rest break due to bilat LE weakness; mod cues for posture and safe use of AD; pt with flexed trunk; pt tends to run into objects in hallway as well as door frames   Stairs            Wheelchair Mobility    Modified Rankin (Stroke Patients Only)       Balance Overall balance assessment: Needs assistance Sitting-balance support: No upper extremity supported;Feet supported Sitting balance-Leahy Scale: Good     Standing balance support: Bilateral upper extremity supported Standing balance-Leahy Scale: Poor                      Cognition Arousal/Alertness: Awake/alert Behavior During Therapy: WFL for tasks assessed/performed Overall Cognitive Status: Within Functional Limits for tasks assessed                      Exercises      General Comments General comments (skin integrity, edema, etc.): pt unable to donn TLSO without assistance      Pertinent Vitals/Pain Pain Assessment: Faces Faces Pain Scale: Hurts little more Pain Location: low back  Pain Descriptors / Indicators: Grimacing;Guarding;Sore Pain Intervention(s): Limited activity within patient's tolerance;Monitored during session;Premedicated before session;Repositioned    Home Living                      Prior Function  PT Goals (current goals can now be found in the care plan section) Acute Rehab PT Goals Patient Stated Goal: to go home PT Goal Formulation: With patient Time For Goal Achievement: 10/25/16 Potential to Achieve Goals: Good Progress towards PT goals: Progressing toward  goals    Frequency    Min 5X/week      PT Plan Current plan remains appropriate    Co-evaluation             End of Session Equipment Utilized During Treatment: Gait belt;Back brace Activity Tolerance: Patient tolerated treatment well Patient left: with call bell/phone within reach;in bed     Time: [id number] PT Time Calculation (min) (ACUTE ONLY): 30 min  Charges:  $Gait Training: 8-22 mins $Therapeutic Activity: 8-22 mins                    G Codes:      [place] April, PTA Pager: [phone]   10/19/2016, 10:34 AM

## 2016-10-19 NOTE — Progress Notes (Signed)
Patient is sitting in the chair dozing off. Patient is very lethargic and confused intermittently. Patient was placed back into bed. Patient walked 15 feet to get back into bed. MD paged. Nurse will continue to monitor

## 2016-10-20 NOTE — Progress Notes (Signed)
Physical Therapy Treatment Patient Details Name: Brad Turner MRN: [id number] DOB: 02-24-1941 Today's Date: 10/20/2016    History of Present Illness Pt is a 76 yo male admitted on 10/17/16 for right sided L4-L5, L5-S1 transforaminal interverterbal fusion. PMH significant for HTN, neuropathy, CAD, BPH, COPD, Emphysema, Fall 10/06/16 resulting in brusing under eyes, GERD, macular degeneration, OA.     PT Comments    Pt sitting on EOB upon arrial without back brace donned.  Educated pt that if sitting for more than 15 mins he should wear back brack for support.  Pt was able to verbalize 3/3 back precautions today and donn back brace independently although joking at first and attempting to put brace on backwards saying "the doctor told me you would try to tell me to put it on like this".   Pt states he will not be going to SNF and will be going home today.  He states he has intermittent assistance.  This clinician updated d/c recommendation to Home with HHPT and supervision for mobility to ensure safety when he goes home however this clinician cont's to feel pt would best benefit from SNF prior to home to maximize safety with functional mobility as he is not independent with mobility at this time.      Follow Up Recommendations  SNF;Home health PT;Supervision for mobility/OOB     Equipment Recommendations  Rolling walker with 5" wheels;3in1 (PT)    Recommendations for Other Services       Precautions / Restrictions Precautions Precautions: Fall;Back Precaution Comments: Pt able to recall 3/3 back precautions Required Braces or Orthoses: Spinal Brace Spinal Brace: Thoracolumbosacral orthotic;Applied in sitting position Restrictions Weight Bearing Restrictions: No    Mobility  Bed Mobility               General bed mobility comments: Pt sitting EOB upon arrival  Transfers Overall transfer level: Needs assistance Equipment used: Rolling walker (2 wheeled) Transfers: Sit to/from  Stand Sit to Stand: Min guard         General transfer comment: Cues for safe hand placement & reinforce back precautions  Ambulation/Gait Ambulation/Gait assistance: Min guard Ambulation Distance (Feet): 140 Feet Assistive device: Rolling walker (2 wheeled) Gait Pattern/deviations: Step-through pattern;Decreased stride length Gait velocity: decreased   General Gait Details: Guarding for safety due to pt reports LE's feel weak and sometimes give out on him.  slow & guarded.  Cues to relax shoulders and increase step length.     Stairs            Wheelchair Mobility    Modified Rankin (Stroke Patients Only)       Balance                                    Cognition Arousal/Alertness: Awake/alert Behavior During Therapy: WFL for tasks assessed/performed Overall Cognitive Status: Within Functional Limits for tasks assessed                      Exercises      General Comments        Pertinent Vitals/Pain Pain Assessment: 0-10 Pain Score: 6  Pain Location: low back  Pain Descriptors / Indicators: Discomfort;Guarding;Grimacing Pain Intervention(s): Limited activity within patient's tolerance;Monitored during session;Premedicated before session;Repositioned    Home Living  Prior Function            PT Goals (current goals can now be found in the care plan section) Acute Rehab PT Goals Patient Stated Goal: to get home to dog PT Goal Formulation: With patient Time For Goal Achievement: 10/25/16 Potential to Achieve Goals: Good Progress towards PT goals: Progressing toward goals    Frequency    Min 5X/week      PT Plan Discharge plan needs to be updated    Co-evaluation             End of Session Equipment Utilized During Treatment: Back brace Activity Tolerance: Patient tolerated treatment well Patient left: in chair;with call bell/phone within reach     Time: 0943-1009 PT Time  Calculation (min) (ACUTE ONLY): 26 min  Charges:  $Gait Training: 8-22 mins $Therapeutic Activity: 8-22 mins                    G Codes:      Sena Hitch 10/20/2016, 10:24 AM   Sarajane Marek, PTA [phone] 10/20/2016

## 2016-10-20 NOTE — Care Management Note (Signed)
Case Management Note  Patient Details  Name: Brad Turner MRN: [id number] Date of Birth: [birth year]  Subjective/Objective:                  right sided L4-L5, L5-S1 transforaminal interverterbal fusion. PMH significant for HTN, neuropathy, Action/Plan: Discharge planning Expected Discharge Date:  10/20/16               Expected Discharge Plan:  [place]  In-House Referral:     Discharge planning Services  CM Consult  Post Acute Care Choice:  Home Health Choice offered to:  Patient  DME Arranged:  3-N-1, Walker rolling DME Agency:  [place]:  PT [place]:  [facility])  Status of Service:  Completed, signed off  If discussed at [employer], dates discussed:    Additional Comments: CM spoke with pt in room to offer choice of home health agency for HHPT. Pt chooses [facility] for home health services.  Referral called to [facility] rep, Tommi Emery who is waiting for f45f and HHPT order.  Cm notified [facility] [employer] rep, Reggie to please deliver the 3n1 and rolling walker to room.  No other Cm needs were communicated. Dellie Catholic, RN 10/20/2016, 2:18 PM

## 2016-10-20 NOTE — Progress Notes (Signed)
Occupational Therapy Treatment Patient Details Name: Brad Turner MRN: [id number] DOB: June 19, 1941 Today's Date: 10/20/2016    History of present illness Pt is a 76 yo male admitted on 10/17/16 for right sided L4-L5, L5-S1 transforaminal interverterbal fusion. PMH significant for HTN, neuropathy, CAD, BPH, COPD, Emphysema, Fall 10/06/16 resulting in brusing under eyes, GERD, macular degeneration, OA.    OT comments  Pt with improved abilities today, and no signs of confusion during session. Pt Able to recall 2/3 precautions, and don brace with modified independence (OT assisted him getting one strap that was hard to reach). Pt had to be re-edcuated for transfer (use both hands to help lower yourself). Pt rude during session telling OT to "mind my superiors and send him home" in joking manner. Pt adamantly refusing SNF, insisting on going home. Pt will have limited assist from son, and still benefits from education and assist to maintain precautions and for functioning. Changing discharge to [facility], but am concerned about Pt going home alone.    Follow Up Recommendations  Home health OT;Supervision - Intermittent (He will only have intermittent supervision, but would benefit 24 hour)    Equipment Recommendations  3 in 1 bedside commode    Recommendations for Other Services      Precautions / Restrictions Precautions Precautions: Fall;Back Precaution Booklet Issued: Yes (comment) Precaution Comments: Pt able to recall 2/3 back precautions. Re-educated Required Braces or Orthoses: Spinal Brace Spinal Brace: Thoracolumbosacral orthotic;Applied in sitting position Restrictions Weight Bearing Restrictions: No       Mobility Bed Mobility               General bed mobility comments: Pt sitting in recliner upon arrival. When OT offered to practice this with Pt, Pt declined stating "I can just roll out of bed at home" OT showed Pt where on the handout it demonstrates safe bed  mobility  Transfers Overall transfer level: Needs assistance Equipment used: Rolling walker (2 wheeled) Transfers: Sit to/from Stand Sit to Stand: Min guard         General transfer comment: Cues for safe hand placement & reinforce back precautions    Balance Overall balance assessment: Needs assistance Sitting-balance support: No upper extremity supported;Feet supported Sitting balance-Leahy Scale: Good     Standing balance support: Bilateral upper extremity supported Standing balance-Leahy Scale: Fair Standing balance comment: vc for safety with RW                   ADL Overall ADL's : Needs assistance/impaired Eating/Feeding: Set up;Sitting   Grooming: Supervision/safety;Standing;Wash/dry hands Grooming Details (indicate cue type and reason): sink level         Upper Body Dressing : Sitting;Minimal assistance Upper Body Dressing Details (indicate cue type and reason): Pt re-educated on how to don brace, Pt only needed help with one strap this session. Pt asked "now when do I REALLY have to wear this thing at home?" Pt educated anytime he is out of bed with the exception of night time bathroom visits.   Lower Body Dressing Details (indicate cue type and reason): When OT entered the room, Pt fully dressed. He stated that he did it all himself with no assist from his son. When OT asked how he did it, he stated "Like I always do". OT educated in crossing of legs method. Pt did not remember AE education from yesterday "I don't think I have that at home, and I don't want it" (when OT tried to explain) Toilet Transfer: Minimal  assistance;Ambulation;BSC;RW Toilet Transfer Details (indicate cue type and reason): BSC over toilet Toileting- Clothing Manipulation and Hygiene: Min guard;Sit to/from stand       Functional mobility during ADLs: Min guard;Rolling walker General ADL Comments: Pt with improved cognition this session, but OT still genuinely concerned for back  precaution maintenance with this Pt.      Vision                     Perception     Praxis      Cognition   Behavior During Therapy: WFL for tasks assessed/performed Overall Cognitive Status: Within Functional Limits for tasks assessed                  General Comments: pt with improved cognition from yesterday    Extremity/Trunk Assessment               Exercises     Shoulder Instructions       General Comments      Pertinent Vitals/ Pain       Pain Assessment: 0-10 Pain Score: 6  Pain Location: low back  Pain Descriptors / Indicators: Discomfort;Guarding;Grimacing Pain Intervention(s): Monitored during session;Limited activity within patient's tolerance;Repositioned  Home Living                                          Prior Functioning/Environment              Frequency  Min 3X/week        Progress Toward Goals  OT Goals(current goals can now be found in the care plan section)  Progress towards OT goals: Progressing toward goals  Acute Rehab OT Goals Patient Stated Goal: go on [place] cruise in June OT Goal Formulation: With patient Time For Goal Achievement: 10/25/16 Potential to Achieve Goals: [place] Discharge plan needs to be updated;Equipment recommendations need to be updated    Co-evaluation                 End of Session Equipment Utilized During Treatment: Gait belt;Rolling walker;Back brace   Activity Tolerance Patient tolerated treatment well   Patient Left in chair;with call bell/phone within reach;with family/visitor present   Nurse Communication Mobility status;Precautions        Time: [id number] OT Time Calculation (min): 40 min  Charges: OT General Charges $OT Visit: 1 Procedure OT Treatments $Self Care/Home Management : 38-52 mins  Jaci Carrel 10/20/2016, 1:59 PM  Hulda Humphrey OTR/L [phone]

## 2016-10-20 NOTE — Progress Notes (Signed)
Subjective: 3 Days Post-Op Procedure(s) (LRB): RIGHT SIDED LUMBAR 4-5, LUMBAR 5-SACRUM 1 TRANSFORAMINAL LUMBAR INTERBODY FUSION WITH INSTRUMENTATION AND ALLOGRAFT (Right)   [name] feeling much better this morning. He states that some of the confusion yesterday was misunderstood joking.  Activity level:  wbat Diet tolerance:  ok Voiding:  ok Patient reports pain as mild and moderate.    Objective: Vital signs in last 24 hours: Temp:  [97.8 F (36.6 C)-98.3 F (36.8 C)] 97.8 F (36.6 C) (02/18 0516) Pulse Rate:  [84-102] 84 (02/18 0516) Resp:  [18] 18 (02/17 1100) BP: (111-162)/(71-101) 162/101 (02/18 0516) SpO2:  [92 %-98 %] 93 % (02/18 0516)  Labs: No results for input(s): HGB in the last 72 hours. No results for input(s): WBC, RBC, HCT, PLT in the last 72 hours. No results for input(s): NA, K, CL, CO2, BUN, CREATININE, GLUCOSE, CALCIUM in the last 72 hours. No results for input(s): LABPT, INR in the last 72 hours.  Physical Exam:  Neurologically intact ABD soft Neurovascular intact Sensation intact distally Intact pulses distally Dorsiflexion/Plantar flexion intact Incision: dressing C/D/I and no drainage No cellulitis present Compartment soft  Assessment/Plan:  3 Days Post-Op Procedure(s) (LRB): RIGHT SIDED LUMBAR 4-5, LUMBAR 5-SACRUM 1 TRANSFORAMINAL LUMBAR INTERBODY FUSION WITH INSTRUMENTATION AND ALLOGRAFT (Right) Advance diet Up with therapy Discharge home with home health today if doing well and cleared by PT/OT. Follow up with Dr. Lynann Bologna as scheduled. Continue taking as little pain meds as possible to help with confusion.     Brad Turner, Brad Turner 10/20/2016, 9:24 AM

## 2016-11-03 ENCOUNTER — Emergency Department (HOSPITAL_COMMUNITY): Payer: Medicare Other

## 2016-11-03 ENCOUNTER — Encounter (HOSPITAL_COMMUNITY): Payer: Self-pay

## 2016-11-03 ENCOUNTER — Emergency Department (HOSPITAL_COMMUNITY)
Admission: EM | Admit: 2016-11-03 | Discharge: 2016-11-03 | Disposition: A | Discharge status: Home / Self Care | Payer: Medicare Other | Attending: Emergency Medicine | Admitting: Emergency Medicine

## 2016-11-03 DIAGNOSIS — Y929 Unspecified place or not applicable: Secondary | ICD-10-CM | POA: Insufficient documentation

## 2016-11-03 DIAGNOSIS — Y999 Unspecified external cause status: Secondary | ICD-10-CM | POA: Diagnosis not present

## 2016-11-03 DIAGNOSIS — M545 Low back pain: Secondary | ICD-10-CM | POA: Diagnosis present

## 2016-11-03 DIAGNOSIS — Y939 Activity, unspecified: Secondary | ICD-10-CM | POA: Insufficient documentation

## 2016-11-03 DIAGNOSIS — Z8673 Personal history of transient ischemic attack (TIA), and cerebral infarction without residual deficits: Secondary | ICD-10-CM | POA: Insufficient documentation

## 2016-11-03 DIAGNOSIS — G8918 Other acute postprocedural pain: Secondary | ICD-10-CM

## 2016-11-03 DIAGNOSIS — J449 Chronic obstructive pulmonary disease, unspecified: Secondary | ICD-10-CM | POA: Diagnosis not present

## 2016-11-03 DIAGNOSIS — W1830XA Fall on same level, unspecified, initial encounter: Secondary | ICD-10-CM | POA: Diagnosis not present

## 2016-11-03 DIAGNOSIS — F17210 Nicotine dependence, cigarettes, uncomplicated: Secondary | ICD-10-CM | POA: Insufficient documentation

## 2016-11-03 DIAGNOSIS — I10 Essential (primary) hypertension: Secondary | ICD-10-CM | POA: Insufficient documentation

## 2016-11-03 DIAGNOSIS — Z85828 Personal history of other malignant neoplasm of skin: Secondary | ICD-10-CM | POA: Insufficient documentation

## 2016-11-03 DIAGNOSIS — I2510 Atherosclerotic heart disease of native coronary artery without angina pectoris: Secondary | ICD-10-CM | POA: Insufficient documentation

## 2016-11-03 MED ORDER — OXYCODONE-ACETAMINOPHEN 5-325 MG PO TABS
1.0000 | ORAL_TABLET | Freq: Once | ORAL | Status: AC
  Administered 2016-11-03: 1 via ORAL
  Filled 2016-11-03: qty 1

## 2016-11-03 NOTE — ED Notes (Signed)
Pt stable, understands discharge instructions, and reasons for return.   

## 2016-11-03 NOTE — ED Triage Notes (Signed)
Patient reports that he fell yesterday and concerned that he has re-injured his l-spine fusion that he had done the past 2 weeks, complains of pain, no neuro deficits, NAD

## 2016-11-03 NOTE — ED Notes (Signed)
Transported to X-ray

## 2016-11-03 NOTE — ED Provider Notes (Addendum)
[place] [facility] DEPT Provider Note   CSN: [id number] Arrival date & time: 11/03/16  1407  By signing my name below, I, Brad Turner, attest that this documentation has been prepared under the direction and in the presence of Brad Johns, MD. Electronically Signed: Hansel Turner, ED Scribe. 11/03/16. 4:04 PM.     History   Chief Complaint Chief Complaint  Patient presents with  . Fall/ back pain    HPI Brad Turner is a 76 y.o. male s/p L4-5, L5-S1 transforaminal lumbar interbody fusion 10/17/16 by Dr. Lenord Fellers who presents to the Emergency Department complaining of moderate lower back pain with radiation to bilateral hips s/p mechanical fall that occurred yesterday. Pt states he stepped on a curb, lost balance and fell backward, landing on his back. He denies head injury or LOC. Pt states his current pain is consistent with the pain he has been experiencing since his recent surgery, and it was slightly worsened after falling. . Per pt, he still has Percocet and Valium for pain. He states he has not taken any medications PTA for management of his pain since falling. He denies numbness or weakness to BLE, bowel or bladder incontinence, fever, additional complaints or injuries.   The history is provided by the patient. No language interpreter was used.    Past Medical History:  Diagnosis Date  . Allergy    seasonal  . Arthritis   . Barrett esophagus   . BPH (benign prostatic hyperplasia)   . Bronchitis    recently   . Bruise    forehead and under both eyes  . CAD (coronary artery disease)    takes Plavix and Aspirin daily  . Chronic back pain   . COPD (chronic obstructive pulmonary disease) (HCC)    Albuterol inhaler as needed.Uses Symbicort daily  . Depression    doesn't take any meds  . Emphysema lung ([place])   . Fall    fell on 10/06/16 with loss of consiousness for a short time.States he remembers getting up but remembers nothing else.  Marland Kitchen GERD (gastroesophageal reflux disease)     takes Omeprazole daily  . History of colon polyps    benign  . History of kidney stones   . Hyperlipidemia    takes Fenofibrate and Lipitor daily  . Hypertension    takes HCTZ and Diovan daily  . Joint pain   . Joint swelling   . 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 Neuropathy ([place])    Lyrica every couple of days.  . Nocturia   . Pneumonia    hx of  . PONV (postoperative nausea and vomiting)   . Skin cancer   . Stroke [facility]) 1995   Affected balance; and has very emotional if watching a movie  . Syncope    yr ago  . Urgency of urination    takes Myrbetriq daily    Patient Active Problem List   Diagnosis Date Noted  . Radiculopathy 10/17/2016  . COPD mixed type ([place]) 06/10/2016  . Abnormal CT of the chest 06/10/2016  . Dysphagia   . Cigarette smoker 06/07/2015  . Essential hypertension, benign 10/30/2014    Class: Chronic  . Syncope 10/29/2014  . Cough syncope 10/07/2014  . COPD exacerbation ([place]) 09/29/2014  . Pulmonary nodules 11/22/2013  . Chest pain, unspecified 10/15/2013  . Personal history of colonic polyps 06/04/2010  . COPD (chronic obstructive pulmonary disease) with emphysema ([place]) 02/26/2010  .  CHEST PAIN, ATYPICAL 02/26/2010  . BARRETTS ESOPHAGUS 05/18/2009  . HYPERLIPIDEMIA 04/28/2008  . Coronary atherosclerosis 04/28/2008  . CVA 04/28/2008  . GERD 04/28/2008  . ARTHRITIS 04/28/2008  . NEPHROLITHIASIS, HX OF 04/28/2008    Past Surgical History:  Procedure Laterality Date  . APPENDECTOMY    . CARDIAC CATHETERIZATION     x 4. Most recent one in 2011 at [facility](3 previous were done 20 yrs before)  . cataracts Bilateral   . COLONOSCOPY    . CYSTOSCOPY  07/21/2013   Dr. Jeffie Pollock  . DENTAL SURGERY     with sedation  . ESOPHAGEAL MANOMETRY N/A 09/20/2015   Procedure: ESOPHAGEAL MANOMETRY (EM);  Surgeon: Manus Gunning, MD;  Location: [facility];  Service: Gastroenterology;  Laterality: N/A;  .  LUMBAR LAMINECTOMY/DECOMPRESSION MICRODISCECTOMY N/A 01/12/2015   Procedure: LUMBAR LAMINECTOMY/DECOMPRESSION MICRODISCECTOMY 3 LEVELS;  Surgeon: Phylliss Bob, MD;  Location: [facility];  Service: Orthopedics;  Laterality: N/A;  Lumbar 3-4, lumbar 4-5, lumbar 5-sacrum 1 decompression  . parathyroid adenoma removed    . POLYPECTOMY    . ROTATOR CUFF REPAIR     bilateral  . TONSILLECTOMY    . UPPER GASTROINTESTINAL ENDOSCOPY         Home Medications    Prior to Admission medications   Medication Sig Start Date End Date Taking? Authorizing Provider  acetaminophen (TYLENOL) 500 MG tablet Take 1,000 mg by mouth every 6 (six) hours as needed (for back pain.).    Historical Provider, MD  albuterol (PROVENTIL HFA;VENTOLIN HFA) 108 (90 BASE) MCG/ACT inhaler Inhale 2 puffs into the lungs every 6 (six) hours as needed for wheezing or shortness of breath. Reported on 11/29/2015    Historical Provider, MD  atorvastatin (LIPITOR) 80 MG tablet Take 80 mg by mouth daily.     Historical Provider, MD  fenofibrate (TRICOR) 145 MG tablet Take 145 mg by mouth daily.     Historical Provider, MD  folic acid (FOLVITE) A999333 MCG tablet Take 400 mcg by mouth daily.    Historical Provider, MD  hydrochlorothiazide (HYDRODIURIL) 12.5 MG tablet Take 12.5 mg by mouth daily.    Historical Provider, MD  LYRICA 75 MG capsule Take 75 mg by mouth daily. 07/29/16   Historical Provider, MD  MYRBETRIQ 50 MG TB24 tablet Take 50 mg by mouth daily.  05/03/15   Historical Provider, MD  NITROSTAT 0.4 MG SL tablet Place 0.4 mg under the tongue every 5 (five) minutes as needed for chest pain. Reported on 11/29/2015 05/29/13   Historical Provider, MD  omeprazole (PRILOSEC) 40 MG capsule TAKE ONE CAPSULE BY MOUTH TWICE DAILY 04/22/16   Manus Gunning, MD  SYMBICORT 160-4.5 MCG/ACT inhaler INHALE TWO PUFFS INTO LUNGS TWICE DAILY 04/19/14   Kathee Delton, MD  tadalafil (CIALIS) 5 MG tablet Take 5 mg by mouth daily. For BPH    Historical  Provider, MD  valsartan (DIOVAN) 160 MG tablet Take 160 mg by mouth daily.    Historical Provider, MD    Family History Family History  Problem Relation Age of Onset  . Stroke Mother   . Rheum arthritis Mother   . Heart disease Father   . Colon cancer      ? mat. uncle died age 12  . Colon polyps Neg Hx   . Esophageal cancer Neg Hx   . Rectal cancer Neg Hx   . Stomach cancer Neg Hx     Social History Social History  Substance Use Topics  . Smoking  status: Current Every [place] Smoker    Packs/Bracken: 1.50    Years: 58.00    Types: Cigarettes  . Smokeless tobacco: Never Used  . Alcohol use 8.4 oz/week    14 Standard drinks or equivalent per week     Comment: every [place], vodka- 1-2 a Mallin      Allergies   Ivp dye [iodinated diagnostic agents] and Macrodantin [nitrofurantoin]   Review of Systems Review of Systems  Constitutional: Negative for chills, diaphoresis, fatigue and fever.  HENT: Negative for congestion, rhinorrhea and sneezing.   Eyes: Negative.   Respiratory: Negative for cough, chest tightness and shortness of breath.   Cardiovascular: Negative for chest pain and leg swelling.  Gastrointestinal: Negative for abdominal pain, blood in stool, diarrhea, nausea and vomiting.       Negative for bowel incontinence.   Genitourinary: Negative for difficulty urinating, flank pain, frequency and hematuria.       Negative for bladder incontinence and saddle anesthesia.   Musculoskeletal: Positive for back pain. Negative for arthralgias and neck pain.  Skin: Negative for rash and wound.  Neurological: Negative for dizziness, speech difficulty, weakness, numbness and headaches.       Negative for paresthesias.      Physical Exam Updated Vital Signs BP 135/77 (BP Location: Right Arm)   Pulse 84   Temp 98.5 F (36.9 C) (Oral)   Resp 18   SpO2 98%   Physical Exam  Constitutional: He is oriented to person, place, and time. He appears well-developed and well-nourished.    HENT:  Head: Normocephalic and atraumatic.  Neck: Normal range of motion. Neck supple.  Cardiovascular: Normal rate.   Pulmonary/Chest: Effort normal.  Musculoskeletal: He exhibits tenderness.  Healing incision to the lumbar spine.No signs of infection. Moderate diffuse tenderness throughout.   Neurological: He is alert and oriented to person, place, and time. No sensory deficit.  Patellar reflexes symmetric bilaterally. Normal motor function and sensation.   Skin: Skin is warm and dry.  Psychiatric: He has a normal mood and affect.  Nursing note and vitals reviewed.    ED Treatments / Results   DIAGNOSTIC STUDIES: Oxygen Saturation is 98% on RA, normal by my interpretation.    COORDINATION OF CARE: 4:00 PM Discussed treatment plan with pt at bedside which includes XR and pt agreed to plan.    Labs (all labs ordered are listed, but only abnormal results are displayed) Labs Reviewed - No data to display  EKG  EKG Interpretation None       Radiology Dg Lumbar Spine Complete  Result Date: 11/03/2016 CLINICAL DATA:  Initial encounter for pts states he fell yesterday. Pt had recent lumbar sx on 10/17/16. EXAM: LUMBAR SPINE - COMPLETE 4+ VIEW COMPARISON:  10/17/2016 FINDINGS: Five lumbar type vertebral bodies. Mild degenerative sclerosis of the left sacroiliac joint. L4-S1 trans pedicle screw fixation. No acute hardware complication. BL4 and L5 screws projects similarly, at the level of the superior endplates. Maintenance of vertebral body height and alignment. Aortic atherosclerosis. IMPRESSION: No acute osseous abnormality. L4-S1 trans pedicle screw fixation. Aortic atherosclerosis. Electronically Signed   By: Abigail Miyamoto M.D.   On: 11/03/2016 16:55    Procedures Procedures (including critical care time)  Medications Ordered in ED Medications  oxyCODONE-acetaminophen (PERCOCET/ROXICET) 5-325 MG per tablet 1 tablet (1 tablet Oral Given 11/03/16 1610)     Initial Impression  / Assessment and Plan / ED Course  I have reviewed the triage vital signs and the nursing notes.  Pertinent labs & imaging results that were available during my care of the patient were reviewed by me and considered in my medical decision making (see chart for details).     Patient with worsened back pain s/p  L4-5, L5-S1 transforaminal lumbar interbody fusion 10/17/16 after a mechanical fall yesterday.  No neurological deficits and normal neuro exam.  Patient is ambulatory.  No loss of bowel or bladder control.  No concern for cauda equina. No red flags.  XR lumbar spine neg for acute abnormality.  Supportive care and return precaution discussed. Appears safe for discharge at this time. Follow up as indicated in discharge paperwork.    Final Clinical Impressions(s) / ED Diagnoses   Final diagnoses:  Post-op pain    New Prescriptions New Prescriptions   No medications on file    I personally performed the services described in this documentation, which was scribed in my presence.  The recorded information has been reviewed and considered.    Brad Johns, MD 11/03/16 [place], MD 11/03/16 [phone]

## 2016-11-21 ENCOUNTER — Ambulatory Visit: Payer: Medicare Other | Admitting: Pulmonary Disease

## 2016-11-28 ENCOUNTER — Ambulatory Visit (INDEPENDENT_AMBULATORY_CARE_PROVIDER_SITE_OTHER): Payer: Medicare Other | Admitting: Pulmonary Disease

## 2016-11-28 VITALS — BP 110/70 | HR 96 | Temp 97.0°F | Ht 66.0 in | Wt 147.2 lb

## 2016-11-28 DIAGNOSIS — M5416 Radiculopathy, lumbar region: Secondary | ICD-10-CM

## 2016-11-28 DIAGNOSIS — I2510 Atherosclerotic heart disease of native coronary artery without angina pectoris: Secondary | ICD-10-CM | POA: Diagnosis not present

## 2016-11-28 DIAGNOSIS — I10 Essential (primary) hypertension: Secondary | ICD-10-CM | POA: Diagnosis not present

## 2016-11-28 DIAGNOSIS — J449 Chronic obstructive pulmonary disease, unspecified: Secondary | ICD-10-CM

## 2016-11-28 DIAGNOSIS — F341 Dysthymic disorder: Secondary | ICD-10-CM

## 2016-11-28 DIAGNOSIS — F17210 Nicotine dependence, cigarettes, uncomplicated: Secondary | ICD-10-CM

## 2016-11-28 DIAGNOSIS — G8929 Other chronic pain: Secondary | ICD-10-CM | POA: Diagnosis not present

## 2016-11-28 DIAGNOSIS — Z981 Arthrodesis status: Secondary | ICD-10-CM

## 2016-11-28 DIAGNOSIS — R110 Nausea: Secondary | ICD-10-CM

## 2016-11-28 DIAGNOSIS — M549 Dorsalgia, unspecified: Secondary | ICD-10-CM

## 2016-11-28 MED ORDER — AZELASTINE HCL 0.1 % NA SOLN: NASAL | 11 refills | Status: DC

## 2016-11-28 NOTE — Patient Instructions (Signed)
Today we updated your med list in our EPIC system...    Continue your current medications the same...  We wrote for a nasal spray to help dry up the drainage...    Try the ASTELIN NS - 1-2 sprays in each nostril twice daily...  Try sonme nutritional supplements like ENSURE, SUSTACAL, etc...   Be sure to get in an adequate amt of liquids...  Today we rechecked your blood work & we'll call you as soon as we know results...  Call for any questions...  You may need a f/u appt w/ your PCP is symptoms persist...  Let's plan a follow up visit in 36mo, sooner if needed for any breathing problems.Marland KitchenMarland Kitchen

## 2016-12-01 ENCOUNTER — Encounter: Payer: Self-pay | Admitting: Pulmonary Disease

## 2016-12-01 DIAGNOSIS — M549 Dorsalgia, unspecified: Secondary | ICD-10-CM

## 2016-12-01 DIAGNOSIS — G8929 Other chronic pain: Secondary | ICD-10-CM | POA: Insufficient documentation

## 2016-12-01 DIAGNOSIS — F341 Dysthymic disorder: Secondary | ICD-10-CM | POA: Insufficient documentation

## 2016-12-01 DIAGNOSIS — Z981 Arthrodesis status: Secondary | ICD-10-CM | POA: Insufficient documentation

## 2016-12-01 NOTE — Progress Notes (Addendum)
Subjective:    Patient ID: Brad Turner, male    DOB: [birth year], 76 y.o.   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    CT Chest 09/17/13>  Clearing of prior RLL opac, 3 small nodules on the left- unchanged from 2011, severe emphysematous change, pos coronary calcif  Spirometry 11/22/13 at [facility]> FVC=3.26 (89%), FEV1=1.84 (69%), %1sec=56;  This is c/w a moderate obstructive ventilatory defect  V/Q Lung Scan 10/29/14>  Patchy nonsegmental ventilation defects, patchy scattered nonsegmental perfusion defects, felt to be a low prob scan...  CXR 10/29/14>  Heart is upper lim of norm, hyperinflated/ emphysematous lungs- NAD, DJD spine...  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     Cigarette smoker>  Still smoking 1.5ppd and he has smoked 1-1.5ppd for ~60 yrs; he declines smoking cessation help & is not motivated to quit...    Hx cough syncope> aware    CARDIAC issues> HBP, CAD, HL- followed by DrTilley (no notes in Epic)- on Plavix75, Amlod5-1/2, Diovan80, Lip80, [phone]; he remains in cardiac rehab regularly...    Dysphagia, hx esoph stricture & Barrett's esoph> on Dexilant60; eval by DrKaplan w/ hx dilatation in the past    GU issues> on Myrbetriq50, Cialis5    Ortho- LBP w/ lumbar lam 2016 by DrDumonski    Anxiety/depression> on WellbutrinXL150; his  son is treated for depression, friend died w/ metastatic lung cancer EXAM shows Afeb, VSS, O2sat=94% on RA;  HEENT- dental issues, mallampati2;  Chest- decr BS bilat w/o w/r/r;  Heart- RR w/o m/r/g;  Abd- soft, nontender, neg;  Ext- w/o c/c/e;  Neuro- intact...  Low dose screening CT Chest10/14/16>  Norm heart size, coronary & aortic calcif, 55mm noncalcif nodule in LUL, scat calcif granulomas up to 2.36mm; mod centrilobular & paraseptal emphysema, abd is ok w/ 2cm cyst in liver...  IMP/PLAN>>  Brad Turner & I discussed his continued smoking & he is not motivated to quit- offered meds and he will call if he changes his mind;  Rec to continue Symbicort160-2spBid, he was tried on Spiriva respimat in past but wouldn't fill it due to cost- I pointed out that if he'd quit smoking he could get the Spiriva & noted that his breathing would be better due to both things! Rec to add Mucinex 2400mg /d.Marland KitchenMarland KitchenWe plan ROV 36months.  ~  December 07, 2015:  90mo ROV w/ SN>  PCP- DrTisovec, Cards- DrTilley, GI- DrArmbruster...    Brad Turner has had some recent dental work w/ mult teeth pulled; he remains in cardiac rehab maintenance program and doing well; no new complaints or concerns; he is using his Symbicort160-2spBid, not using the Mucinex; he has chr stable DOE, min cough, small amt beige sput, no CP/ palpit/ edema...    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     Cigarette smoker>  Still smoking 1.5ppd and he has smoked 1-1.5ppd for ~60 yrs; he declines smoking cessation help & is not motivated to quit...    Hx cough syncope> aware    CARDIAC issues> HBP, CAD, HL- followed by DrTilley (no notes in Epic)- on Plavix75, Amlod5-1/2, Diovan80, Lip80, [phone]; he remains in cardiac rehab regularly... EXAM shows Afeb, VSS, O2sat=92% on RA;  HEENT- dental issues, mallampati2;  Chest- decr BS  bilat w/o w/r/r;  Heart- RR w/o m/r/g;  Abd- soft, nontender, neg;  Ext- w/o c/c/e;  Neuro- intact... IMP/PLAN>>  Brad Turner is unable to quit smoking and has mod COPD w/ emphysema on his CTscans- we will add INCRUSE one inhalation daily; he knows to avoid infections & be sure he is up to date on all vaccinations from DrTisovec... we plan ROV 56mo at which time he will need his next screening CT Chest...  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 He had a recent trip to [place] & did ok w/ walking & breathing but had some back discomfort that was more limiting; he saw DrDumonski recently w/ talk of rods and screws "but I'm too high risk" and they are considering trial of nerve blocks... Of interest he noted that his 19 y/o son (adopted) had a baby daughter recently Dx w/ CF... 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    Cigarette smoker>  Still smoking 1.5ppd and he has smoked 1-1.5ppd for ~60 yrs; he declines smoking cessation help & is not motivated to quit...    Hx cough syncope> past hx syncope w/ severe coughing  paroxysms- aware    Cardiac issues> HBP, CAD, HL- followed by DrTilley (no notes in Epic, pt reports last seen 02/2016)- on Plavix75, Amlod5-1/2, Diovan80, Lip80, Tricor145; he remains in cardiac rehab regularly...    Medical issues> followed by DrTisovec- Hx dysphagia/ stricture/ Barrett's, LBP- s/p lumbar lam, anxiety/depression EXAM shows Afeb, VSS, O2sat=98% on RA;  HEENT- dental issues, mallampati2;  Chest- decr BS bilat w/o w/r/r;  Heart- RR w/o m/r/g;  Abd- soft, nontender, neg;  Ext- w/o c/c/e;  Neuro- intact...  CXR 06/10/16>  Norm heart size, atherosclerotic Ao, no adenopathy, stable 24mm opac in LUL is noted- NAD, old trauma to left clavicle...   Low-dose screening CT Chest 06/18/16>  Norm heart size w/ atherosclerotic changes in Ao, great vessels, & coronaries; No adenopathy; Mult tiny pulm nodules bilat, most are calcified & c/w granulomas; Largest non-calcif nodule is 4-73mm  in LUL & no change from 74yr ago- no new lesions; Diffuse bronchial wall thickening, & mod centrilobular & paraseptal emphysema; Small liver cyst & small bilat renal cysts noted...  Spirometry 06/10/16>  FVC=4.10 (112%), FEV1=2.00 (76%), %1sec=49%, mid-flows reduced at 32% predicted...  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  ~  October 09, 2016:  45mo ROV & add-on appt requested for syncopal episode>  Brad Turner describes a mild COPD exac recently and ~10d ago he was seen by PA in DrTisovec's office & given ZPak & Pred pack; he also states that he hasn't been sleeping well at night due to back pain & c/o urinary  frequency (having to get up about 10x per night which he thinks is related to Lyrica making his ankles swell & his HCT diuretic); then about 4 nights ago he got up to urinate and doesn't remember what happened next but he appears to have had a syncopal episode & awoke a short time late on the floor of his bedroom having lacerated his forehead & bled on the carpet, plus 2 black eyes; he had a remote hx of cough syncope & wondered if this might have occurred again but clearly this episode was very different than previous (ie- previously he had a severe coughing paroxysm that lead to syncope from the valsalva, and this episode did not involve coughing paroxysm in fact he noted that legs were very weak due to his spinal condition & sounds like he fell- hit his head w/ prob concussion- and awoke a short time later w/ amnesia for the actual fall); Note- he did not call 911 or go to the ER or urgent care facility; he called DrTisovec's office and was referred here for pulmonary eval due to his remote hx of cough syncope... NOTE- DrDumonski plans back surg in ~10 days! 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    Cigarette smoker>  Still smoking 1.5ppd and he has smoked 1-1.5ppd for ~60 yrs; he declines  smoking cessation help & is not motivated to quit despite knowing the risks...    Hx cough syncope> past hx syncope w/ severe coughing paroxysms while on an ACE inhibitor- aware    Cardiac issues> HBP, CAD, HL- followed by DrTilley (no notes in Epic, pt reports last seen 09/2016 &  cleared for back surg w/ neg nuclear study)- on ASA/Plavix75, Diovan160, HCT12.5, Lip80, Tricor145; he quit cardiac rehab maintenance 06/2016    Medical issues> followed by DrTisovec- Hx dysphagia/ stricture/ Barrett's (on Prilosec40), prostate and bladder problems (on Myrbetriq & Cialis), severe LBP & s/p lumbar lam (on Vicodin & Lyrica), anxiety/depression EXAM shows Afeb, VSS, O2sat=98% on RA;  HEENT- dental issues, mallampati2;  Chest- congested cough w/ scat basilar rhonchi, decr BS bilat w/o w/r/consolidation;  Heart- RR w/o m/r/g;  Abd- soft, nontender, neg;  Ext- w/o c/c/e.   CXR in [place] 08/02/16 (independently reviewed by me in the PACS system) showed norm heart size, atherosclerotic ao, clear lungs- NAD,   LABS in Epic 08/2016>  Chems showed hyponatremia w/ Na=132, & renal insuffic w/ BUN=38, Cr=2.04;  CBC is wnl w/ Hg=13.0.Marland KitchenMarland Kitchen  IMP/PLAN>>  Brad Turner's pulmonary situation remains the same- mixed COPD w/ emphysematous and chr bronchitic components, still smoking 1.5ppd, using his Symbicort (but I suspect not regularly), refuses addition of a LAMA med that would be additionally beneficial in the long run;  He improved after Zithromax & Pred pack per DrTisovec recently & we will place him on a sl longer course of oral PRED (see AVS), plus maximize mucolytic therapy w/ GFN400-2Qid + fluids;  His recent ?syncopal spell does not appear to be cough related- perhaps a fall from leg weakness, head trauma w/ concussion & retrograde amnesia for the fall- in any event he did not go to the ER & is in need of concussion eval/ CT vs MRI head (he is on ASA/Plavix)/ etc; pt has an ASAP appt w/ his PCP in the AM... I am concerned regarding his  surgical risk.  NOTE: >50% of this 71min visit was spent in counseling & coordination of care.   ~  November 28, 2016:  6wk ROV & post hosp check>  Brad Turner has a hx of prev back surgery & was [facility] 2/15 - 10/20/16 by Ortho- DrDumonski for Lumbar laminectomy & fusion (rods & screws) w/ allograft due to severe unremitting pain;  He underwent post-op rehab & improved (still using walker), then fell 11/03/16 & was seen in the ER- XRays were ok w/o acute changes, he continues to f/u w/ DrDumonski- on Percocet & muscle relaxers... He is under a lot of stress as well since his 74 y/o yellow lab "Festus Holts" is at the end of her life...    COPD,emphysema> on Symbicort160 & AlbutHFA as needed; he says his breathing is ok, notes some watery nasal drainage (try Astelin), min cough w/ min thick sput (no discoloration or blood)...     Pulm nodules> aware, see above...    Cigarette smoker> still smoking 1ppd, and can't vs won't quit... EXAM shows Afeb, VSS, O2sat=98% on RA;  He has lost 13# down to 147# today (not eating, no appetite);  HEENT- dental issues, mallampati2;  Chest- sl congested cough w/ scat basilar rhonchi, decr BS bilat w/o w/r/consolidation;  Heart- RR w/o m/r/g;  Abd- soft, nontender, neg;  Ext- w/o c/c/e.   LABS 12/02/16>  Chems- ok x BS=112;  CBC- ok w/ Hg=12.4;  TSH= 1.00 IMP/PLAN>>  Maxmilian is rec to add a nutritional supplements to his regimen, try Astelin NS for the runny nose, and continue the [employer];  But most importantly he must committ to smoking cessation!! Continue same meds...     Past Medical History:  Diagnosis Date  . Allergy    seasonal  . Arthritis   . Barrett esophagus   .  BPH (benign prostatic hyperplasia)   . Bronchitis    recently   . Bruise    forehead and under both eyes  . CAD (coronary artery disease)    takes Plavix and Aspirin daily  . Chronic back pain   . COPD (chronic obstructive pulmonary disease) (HCC)    Albuterol inhaler as needed.Uses Symbicort daily    . Depression    doesn't take any meds  . Emphysema lung ([place])   . Fall    fell on 10/06/16 with loss of consiousness for a short time.States he remembers getting up but remembers nothing else.  Marland Kitchen GERD (gastroesophageal reflux disease)    takes Omeprazole daily  . History of colon polyps    benign  . History of kidney stones   . Hyperlipidemia    takes Fenofibrate and Lipitor daily  . Hypertension    takes HCTZ and Diovan daily  . Joint pain   . Joint swelling   . 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 Neuropathy ([place])    Lyrica every couple of days.  . Nocturia   . Pneumonia    hx of  . PONV (postoperative nausea and vomiting)   . Skin cancer   . Stroke [facility]) 1995   Affected balance; and has very emotional if watching a movie  . Syncope    yr ago  . Urgency of urination    takes Myrbetriq daily    Past Surgical History:  Procedure Laterality Date  . APPENDECTOMY    . CARDIAC CATHETERIZATION     x 4. Most recent one in 2011 at [facility](3 previous were done 20 yrs before)  . cataracts Bilateral   . COLONOSCOPY    . CYSTOSCOPY  07/21/2013   Dr. Jeffie Pollock  . DENTAL SURGERY     with sedation  . ESOPHAGEAL MANOMETRY N/A 09/20/2015   Procedure: ESOPHAGEAL MANOMETRY (EM);  Surgeon: Manus Gunning, MD;  Location: [facility];  Service: Gastroenterology;  Laterality: N/A;  . LUMBAR LAMINECTOMY/DECOMPRESSION MICRODISCECTOMY N/A 01/12/2015   Procedure: LUMBAR LAMINECTOMY/DECOMPRESSION MICRODISCECTOMY 3 LEVELS;  Surgeon: Phylliss Bob, MD;  Location: [place];  Service: Orthopedics;  Laterality: N/A;  Lumbar 3-4, lumbar 4-5, lumbar 5-sacrum 1 decompression  . parathyroid adenoma removed    . POLYPECTOMY    . ROTATOR CUFF REPAIR     bilateral  . TONSILLECTOMY    . UPPER GASTROINTESTINAL ENDOSCOPY      Outpatient Encounter Prescriptions as of 11/28/2016  Medication Sig  . acetaminophen (TYLENOL) 500 MG tablet Take 1,000 mg by mouth every  6 (six) hours as needed (for back pain.).  Marland Kitchen albuterol (PROVENTIL HFA;VENTOLIN HFA) 108 (90 BASE) MCG/ACT inhaler Inhale 2 puffs into the lungs every 6 (six) hours as needed for wheezing or shortness of breath. Reported on 11/29/2015  . aspirin EC 81 MG tablet Take 81 mg by mouth daily.  Marland Kitchen atorvastatin (LIPITOR) 80 MG tablet Take 80 mg by mouth daily.   . clopidogrel (PLAVIX) 75 MG tablet Take 75 mg by mouth daily.   . fenofibrate (TRICOR) 145 MG tablet Take 145 mg by mouth daily.   . folic acid (FOLVITE) 026 MCG tablet Take 400 mcg by mouth daily.  . hydrochlorothiazide (HYDRODIURIL) 12.5 MG tablet Take 12.5 mg by mouth daily.  Marland Kitchen HYDROcodone-acetaminophen (NORCO/VICODIN) 5-325 MG tablet Take 0.5-1 tablets by mouth 2 (two) times daily as needed for severe pain.  Marland Kitchen LYRICA 75 MG capsule Take  75 mg by mouth daily.  Marland Kitchen MYRBETRIQ 50 MG TB24 tablet Take 50 mg by mouth daily.   Marland Kitchen NITROSTAT 0.4 MG SL tablet Place 0.4 mg under the tongue every 5 (five) minutes as needed for chest pain. Reported on 11/29/2015  . omeprazole (PRILOSEC) 40 MG capsule TAKE ONE CAPSULE BY MOUTH TWICE DAILY  . SYMBICORT 160-4.5 MCG/ACT inhaler INHALE TWO PUFFS INTO LUNGS TWICE DAILY  . tadalafil (CIALIS) 5 MG tablet Take 5 mg by mouth daily. For BPH  . valsartan (DIOVAN) 160 MG tablet Take 160 mg by mouth daily.    Allergies  Allergen Reactions  . Ivp Dye [Iodinated Diagnostic Agents] Hives and Shortness Of Breath    reaction was when pt was 76 years old.  Clancy Gourd [Nitrofurantoin] Other (See Comments)    Fever that lasted several months    Immunization History  Administered Date(s) Administered  . Influenza Split 05/14/2012  . Influenza Whole 06/02/2010, 05/09/2011  . Influenza, High Dose Seasonal PF 06/17/2016  . Influenza,inj,Quad PF,36+ Mos 06/02/2013, 05/24/2014  . Influenza-Unspecified 05/31/2015  . Pneumococcal Polysaccharide-23 06/02/2009  . Tdap 10/29/2014  HE WILL CHECK w/ DrTISOVEC regarding  PREVNAR-13 vaccination...   Current Medications, Allergies, Past Medical History, Past Surgical History, Family History, and Social History were reviewed in [employer] record.   Review of Systems         All symptoms NEG except where BOLDED >>  Constitutional:  F/C/S, fatigue, anorexia, unexpected weight change. HEENT:  HA, visual changes, hearing loss, earache, nasal symptoms, sore throat, mouth sores, hoarseness. Resp:  cough, sputum, hemoptysis; SOB, tightness, wheezing. Cardio:  CP, palpit, DOE, orthopnea, edema. GI:  N/V/D/C, blood in stool; reflux, abd pain, distention, gas. GU:  dysuria, freq, urgency, hematuria, flank pain, voiding difficulty. MS:  joint pain, swelling, tenderness, decr ROM; neck pain, back pain, etc. Neuro:  HA, tremors, seizures, dizziness, syncope, weakness, numbness, gait abn. Skin:  suspicious lesions or skin rash. Heme:  adenopathy, bruising, bleeding. Psyche:  confusion, agitation, sleep disturbance, hallucinations, anxiety, depression suicidal.     Objective:   Physical Exam    Vital Signs:  Reviewed...   General:  WD, WN, 76 y/o WM chr ill appearing; alert & oriented; pleasant & cooperative... HEENT:  Forehead laceration, bilat black eyes; Conjunctiva- red, Sclera- nonicteric, EOM-wnl, PERRLA; EACs-clear, TMs-wnl; NOSE-clear; THROAT- sl red Neck:  Supple w/ decr ROM; no JVD; normal carotid impulses w/o bruits; no thyromegaly or nodules palpated; no lymphadenopathy.  Chest:  Decr BS bilat w/ few scat rhonchi at bases, congested cough, no wheezing/ rales/ or consolidation... Heart:  Regular Rhythm; norm S1 & S2 without murmurs, rubs, or gallops detected. Abdomen:  Soft & nontender- no guarding or rebound; normal bowel sounds; no organomegaly or masses palpated. Ext:  Decr ROM; without deformities, +arthritic changes; no varicose veins, venous insuffic, or edema;  Pulses intact w/o bruits. Neuro:  CNs II-XII intact; motor testing  normal; sensory testing normal; +gait abn, sl leg weakness, balance is off... Derm:  No lesions noted x bilat ecchymoses periorbitally... Lymph:  No cervical, supraclavicular, axillary, or inguinal adenopathy palpated.    Assessment & Plan:    IMP >>     COPD> on Symbicort160-2spBid, ProventilHFA prn; he never filled the Incruse or Spiriva ($$); rec to add GFN400-2Qid + fluids...    Pulm nodules on CT Chest> Yearly f/u low-dose screening CT done 06/2016- mult tiny calcif nodules c/w old granulomatous dis + 4-55mm LUL nodule- stable (see report).Marland KitchenMarland Kitchen  Cigarette smoker>  Still smoking 1.5ppd and he has smoked 1-1.5ppd for ~60 yrs; he declines smoking cessation help & is not motivated to quit...    Hx cough syncope> this occurred remotely w/ severe coughing paroxysm while he was on an ACE inhib...    CARDIAC issues> HBP, CAD, HL- followed by DrTilley (no notes in Epic)- on ASA/Plavix75, Diovan160, HCT12.5, Lip80, Tricor145...    MEDICAL ISSUES>        Dysphagia, hx esoph stricture & Barrett's esoph> on Prilosec40; eval by DrKaplan in past w/ hx dilatation in the past, now followed by DrArmbruster...       GU issues> on Myrbetriq50, Cialis5       Ortho- LBP w/ lumbar lam 2016 by DrDumonski who now plans further extensive surg...       Anxiety/depression> prev on WellbutrinXL150; his son is treated for depression, friend died w/ metastatic lung cancer  PLAN >>   06/07/15>   Cornellius & I discussed his continued smoking & he is not motivated to quit- offered meds and he will call if he changes his mind;  Rec to continue Symbicort160-2spBid, he was tried on Spiriva respimat in past but wouldn't fill it due to cost- I pointed out that if he'd quit smoking he could get the Spiriva & noted that his breathing would be better due to both things! Rec to add Mucinex 2400mg /d.Marland KitchenMarland KitchenWe plan ROV 42months. 12/07/15>   Genaro is unable to quit smoking and has mod COPD w/ emphysema on his CTscans- we will add INCRUSE one  inhalation daily; he knows to avoid infections & be sure he is up to date on all vaccinations from DrTisovec. 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; same Rx & we plan ROV 82mo 10/09/16>   Salam's pulmonary situation remains the same- mixed COPD w/ emphysematous and chr bronchitic components, still smoking 1.5ppd, using his Symbicort (but I suspect not regularly), refuses addition of a LAMA med that would be additionally beneficial in the long run;  He improved after Zithromax & Pred pack per DrTisovec recently & we will place him on a sl longer course of oral PRED (see AVS), plus maximize mucolytic therapy w/ GFN400-2Qid + fluids;  His recent ?syncopal spell does not appear to be cough related- perhaps a fall from leg weakness, head trauma w/ concussion & retrograde amnesia for the fall- in any event he did not go to the ER & is in need of concussion eval/ CT vs MRI head (he is on ASA/Plavix)/ etc; pt has an ASAP appt w/ his PCP in the AM... I am concerned regarding his surgical risk. 11/28/16>   Adib is rec to add a nutritional supplements to his regimen, try Astelin NS for the runny nose, and continue the [employer];  But most importantly he must committ to smoking cessation!! Continue same meds   Patient's Medications  New Prescriptions   AZELASTINE (ASTELIN) 0.1 % NASAL SPRAY    Use 1-2 sprays in each nostril two times daily as needed  Previous Medications   ACETAMINOPHEN (TYLENOL) 500 MG TABLET    Take 1,000 mg by mouth  every 6 (six) hours as needed (for back pain.).   ALBUTEROL (PROVENTIL HFA;VENTOLIN HFA) 108 (90 BASE) MCG/ACT INHALER    Inhale  2 puffs into the lungs every 6 (six) hours as needed for wheezing or shortness of breath. Reported on 11/29/2015   ATORVASTATIN (LIPITOR) 80 MG TABLET    Take 80 mg by mouth daily.    FENOFIBRATE (TRICOR) 145 MG TABLET    Take 145 mg by mouth daily.    FOLIC ACID (FOLVITE) 725 MCG TABLET    Take 400 mcg by mouth daily.   HYDROCHLOROTHIAZIDE (HYDRODIURIL) 12.5 MG TABLET    Take 12.5 mg by mouth daily.   LYRICA 75 MG CAPSULE    Take 75 mg by mouth daily.   MYRBETRIQ 50 MG TB24 TABLET    Take 50 mg by mouth daily.    NITROSTAT 0.4 MG SL TABLET    Place 0.4 mg under the tongue every 5 (five) minutes as needed for chest pain. Reported on 11/29/2015   OMEPRAZOLE (PRILOSEC) 40 MG CAPSULE    TAKE ONE CAPSULE BY MOUTH TWICE DAILY   SYMBICORT 160-4.5 MCG/ACT INHALER    INHALE TWO PUFFS INTO LUNGS TWICE DAILY   TADALAFIL (CIALIS) 5 MG TABLET    Take 5 mg by mouth daily. For BPH   VALSARTAN (DIOVAN) 160 MG TABLET    Take 160 mg by mouth daily.  Modified Medications   No medications on file  Discontinued Medications   No medications on file

## 2016-12-02 ENCOUNTER — Other Ambulatory Visit (INDEPENDENT_AMBULATORY_CARE_PROVIDER_SITE_OTHER): Payer: Medicare Other

## 2016-12-02 DIAGNOSIS — R110 Nausea: Secondary | ICD-10-CM

## 2016-12-02 LAB — CBC WITH DIFFERENTIAL/PLATELET
BASOS ABS: 0 10*3/uL (ref 0.0–0.1)
Basophils Relative: 0.3 % (ref 0.0–3.0)
EOS ABS: 0.1 10*3/uL (ref 0.0–0.7)
Eosinophils Relative: 1.1 % (ref 0.0–5.0)
HEMATOCRIT: 37.5 % — AB (ref 39.0–52.0)
HEMOGLOBIN: 12.4 g/dL — AB (ref 13.0–17.0)
LYMPHS ABS: 2.1 10*3/uL (ref 0.7–4.0)
Lymphocytes Relative: 30.5 % (ref 12.0–46.0)
MCHC: 33.1 g/dL (ref 30.0–36.0)
MCV: 98.6 fl (ref 78.0–100.0)
MONO ABS: 0.7 10*3/uL (ref 0.1–1.0)
Monocytes Relative: 9.8 % (ref 3.0–12.0)
Neutro Abs: 4 10*3/uL (ref 1.4–7.7)
Neutrophils Relative %: 58.3 % (ref 43.0–77.0)
PLATELETS: 391 10*3/uL (ref 150.0–400.0)
RBC: 3.8 Mil/uL — AB (ref 4.22–5.81)
RDW: 14.2 % (ref 11.5–15.5)
WBC: 6.8 10*3/uL (ref 4.0–10.5)

## 2016-12-02 LAB — COMPREHENSIVE METABOLIC PANEL
ALBUMIN: 3.7 g/dL (ref 3.5–5.2)
ALK PHOS: 103 U/L (ref 39–117)
ALT: 31 U/L (ref 0–53)
AST: 34 U/L (ref 0–37)
BILIRUBIN TOTAL: 0.7 mg/dL (ref 0.2–1.2)
BUN: 19 mg/dL (ref 6–23)
CALCIUM: 8.8 mg/dL (ref 8.4–10.5)
CHLORIDE: 107 meq/L (ref 96–112)
CO2: 27 mEq/L (ref 19–32)
Creatinine, Ser: 1.16 mg/dL (ref 0.40–1.50)
GFR: 65.49 mL/min (ref >60.00)
Glucose, Bld: 112 mg/dL — ABNORMAL HIGH (ref 70–99)
Potassium: 3.9 mEq/L (ref 3.5–5.1)
Sodium: 141 mEq/L (ref 135–145)
TOTAL PROTEIN: 7 g/dL (ref 6.0–8.3)

## 2016-12-02 LAB — TSH: TSH: 1 u[IU]/mL (ref 0.35–4.50)

## 2016-12-18 ENCOUNTER — Other Ambulatory Visit: Payer: Self-pay | Admitting: Orthopedic Surgery

## 2016-12-18 DIAGNOSIS — M5416 Radiculopathy, lumbar region: Secondary | ICD-10-CM

## 2016-12-20 ENCOUNTER — Ambulatory Visit
Admission: RE | Admit: 2016-12-20 | Discharge: 2016-12-20 | Disposition: A | Discharge status: Home / Self Care | Payer: Medicare Other | Source: Ambulatory Visit | Attending: Orthopedic Surgery | Admitting: Orthopedic Surgery

## 2016-12-20 DIAGNOSIS — M5416 Radiculopathy, lumbar region: Secondary | ICD-10-CM

## 2017-02-06 ENCOUNTER — Other Ambulatory Visit: Payer: Self-pay | Admitting: Orthopedic Surgery

## 2017-02-06 ENCOUNTER — Telehealth: Payer: Self-pay

## 2017-02-06 DIAGNOSIS — M533 Sacrococcygeal disorders, not elsewhere classified: Secondary | ICD-10-CM

## 2017-02-06 NOTE — Telephone Encounter (Signed)
Spoke with patient and he is aware to take Prednisone 50mg  PO 02/13/17 @ 1930 and 02/14/17 @ 0130 and 0730.  He understands to take Benadryl 50mg  PO 02/14/17 @ 0730, too.  Brita Romp, R

## 2017-02-14 ENCOUNTER — Ambulatory Visit
Admission: RE | Admit: 2017-02-14 | Discharge: 2017-02-14 | Disposition: A | Discharge status: Home / Self Care | Payer: Medicare Other | Source: Ambulatory Visit | Attending: Orthopedic Surgery | Admitting: Orthopedic Surgery

## 2017-02-14 DIAGNOSIS — M533 Sacrococcygeal disorders, not elsewhere classified: Secondary | ICD-10-CM

## 2017-03-03 ENCOUNTER — Ambulatory Visit (INDEPENDENT_AMBULATORY_CARE_PROVIDER_SITE_OTHER): Payer: Medicare Other | Admitting: Pulmonary Disease

## 2017-03-03 VITALS — BP 112/68 | HR 90 | Temp 97.1°F | Ht 66.0 in | Wt 137.0 lb

## 2017-03-03 DIAGNOSIS — M549 Dorsalgia, unspecified: Secondary | ICD-10-CM

## 2017-03-03 DIAGNOSIS — F341 Dysthymic disorder: Secondary | ICD-10-CM

## 2017-03-03 DIAGNOSIS — I2510 Atherosclerotic heart disease of native coronary artery without angina pectoris: Secondary | ICD-10-CM

## 2017-03-03 DIAGNOSIS — Z981 Arthrodesis status: Secondary | ICD-10-CM

## 2017-03-03 DIAGNOSIS — G8929 Other chronic pain: Secondary | ICD-10-CM | POA: Diagnosis not present

## 2017-03-03 DIAGNOSIS — I10 Essential (primary) hypertension: Secondary | ICD-10-CM

## 2017-03-03 DIAGNOSIS — F17210 Nicotine dependence, cigarettes, uncomplicated: Secondary | ICD-10-CM

## 2017-03-03 DIAGNOSIS — J449 Chronic obstructive pulmonary disease, unspecified: Secondary | ICD-10-CM

## 2017-03-03 DIAGNOSIS — M5416 Radiculopathy, lumbar region: Secondary | ICD-10-CM

## 2017-03-03 NOTE — Patient Instructions (Signed)
Today we updated your med list in our EPIC system...    Continue your current medications the same...  Please please please decrease the smoking & continue to work on smoking cessation!!!  Continue the SYMBICORT160- 2sprays twice daily...  Use the PROAIR-HFA rescue inhaler as needed...  We discussed taking GUAIFENESIN for the thick phlegm- 2400mg /d    You can use OTC MUCINEX 1200mg  twice daily or 600mg  4 times daily...    You can use the less expensive generic Guaifenesin from [facility] or [employer] 400mg  tabs - 2tabs 3 times daily...    And don't foget the extra fluids...  Check w/ drTisovec about the PREVNAR-13 pneumonia shot   Continue your nutritional supplements & gentle exercise program...  Call for any questions...  Let's plan a follow up visit in 4-51mo, sooner if needed for problems.Marland KitchenMarland Kitchen

## 2017-03-04 ENCOUNTER — Encounter: Payer: Self-pay | Admitting: Pulmonary Disease

## 2017-03-04 NOTE — Progress Notes (Addendum)
Subjective:    Patient ID: Rexford Prevo Napolitano, male    DOB: 1941-03-20, 76 y.o.   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    CT Chest 09/17/13>  Clearing of prior RLL opac, 3 small nodules on the left- unchanged from 2011, severe emphysematous change, pos coronary calcif  Spirometry 11/22/13 at [facility]> FVC=3.26 (89%), FEV1=1.84 (69%), %1sec=56;  This is c/w a moderate obstructive ventilatory defect  V/Q Lung Scan 10/29/14>  Patchy nonsegmental ventilation defects, patchy scattered nonsegmental perfusion defects, felt to be a low prob scan...  CXR 10/29/14>  Heart is upper lim of norm, hyperinflated/ emphysematous lungs- NAD, DJD spine...  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     Cigarette smoker>  Still smoking 1.5ppd and he has smoked 1-1.5ppd for ~60 yrs; he declines smoking cessation help & is not motivated to quit...    Hx cough syncope> aware    CARDIAC issues> HBP, CAD, HL- followed by DrTilley (no notes in Epic)- on Plavix75, Amlod5-1/2, Diovan80, Lip80, [phone]; he remains in cardiac rehab regularly...    Dysphagia, hx esoph stricture & Barrett's esoph> on Dexilant60; eval by DrKaplan w/ hx dilatation in the past    GU issues> on Myrbetriq50, Cialis5    Ortho- LBP w/ lumbar lam 2016 by DrDumonski    Anxiety/depression> on WellbutrinXL150; his  son is treated for depression, friend died w/ metastatic lung cancer EXAM shows Afeb, VSS, O2sat=94% on RA;  HEENT- dental issues, mallampati2;  Chest- decr BS bilat w/o w/r/r;  Heart- RR w/o m/r/g;  Abd- soft, nontender, neg;  Ext- w/o c/c/e;  Neuro- intact...  Low dose screening CT Chest10/14/16>  Norm heart size, coronary & aortic calcif, 13mm noncalcif nodule in LUL, scat calcif granulomas up to 2.102mm; mod centrilobular & paraseptal emphysema, abd is ok w/ 2cm cyst in liver...  IMP/PLAN>>  Legrand Como & I discussed his continued smoking & he is not motivated to quit- offered meds and he will call if he changes his mind;  Rec to continue Symbicort160-2spBid, he was tried on Spiriva respimat in past but wouldn't fill it due to cost- I pointed out that if he'd quit smoking he could get the Spiriva & noted that his breathing would be better due to both things! Rec to add Mucinex 2400mg /d.Marland KitchenMarland KitchenWe plan ROV 59months.  ~  December 07, 2015:  83mo ROV w/ SN>  PCP- DrTisovec, Cards- DrTilley, GI- DrArmbruster...    [name] has had some recent dental work w/ mult teeth pulled; he remains in cardiac rehab maintenance program and doing well; no new complaints or concerns; he is using his Symbicort160-2spBid, not using the Mucinex; he has chr stable DOE, min cough, small amt beige sput, no CP/ palpit/ edema...    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     Cigarette smoker>  Still smoking 1.5ppd and he has smoked 1-1.5ppd for ~60 yrs; he declines smoking cessation help & is not motivated to quit...    Hx cough syncope> aware    CARDIAC issues> HBP, CAD, HL- followed by DrTilley (no notes in Epic)- on Plavix75, Amlod5-1/2, Diovan80, Lip80, [phone]; he remains in cardiac rehab regularly... EXAM shows Afeb, VSS, O2sat=92% on RA;  HEENT- dental issues, mallampati2;  Chest- decr BS  bilat w/o w/r/r;  Heart- RR w/o m/r/g;  Abd- soft, nontender, neg;  Ext- w/o c/c/e;  Neuro- intact... IMP/PLAN>>  [name] is unable to quit smoking and has mod COPD w/ emphysema on his CTscans- we will add INCRUSE one inhalation daily; he knows to avoid infections & be sure he is up to date on all vaccinations from DrTisovec... we plan ROV 56mo at which time he will need his next screening CT Chest...  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 He had a recent trip to [place] & did ok w/ walking & breathing but had some back discomfort that was more limiting; he saw DrDumonski recently w/ talk of rods and screws "but I'm too high risk" and they are considering trial of nerve blocks... Of interest he noted that his 19 y/o son (adopted) had a baby daughter recently Dx w/ CF... 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    Cigarette smoker>  Still smoking 1.5ppd and he has smoked 1-1.5ppd for ~60 yrs; he declines smoking cessation help & is not motivated to quit...    Hx cough syncope> past hx syncope w/ severe coughing  paroxysms- aware    Cardiac issues> HBP, CAD, HL- followed by DrTilley (no notes in Epic, pt reports last seen 02/2016)- on Plavix75, Amlod5-1/2, Diovan80, Lip80, Tricor145; he remains in cardiac rehab regularly...    Medical issues> followed by DrTisovec- Hx dysphagia/ stricture/ Barrett's, LBP- s/p lumbar lam, anxiety/depression EXAM shows Afeb, VSS, O2sat=98% on RA;  HEENT- dental issues, mallampati2;  Chest- decr BS bilat w/o w/r/r;  Heart- RR w/o m/r/g;  Abd- soft, nontender, neg;  Ext- w/o c/c/e;  Neuro- intact...  CXR 06/10/16>  Norm heart size, atherosclerotic Ao, no adenopathy, stable 24mm opac in LUL is noted- NAD, old trauma to left clavicle...   Low-dose screening CT Chest 06/18/16>  Norm heart size w/ atherosclerotic changes in Ao, great vessels, & coronaries; No adenopathy; Mult tiny pulm nodules bilat, most are calcified & c/w granulomas; Largest non-calcif nodule is 4-73mm  in LUL & no change from 74yr ago- no new lesions; Diffuse bronchial wall thickening, & mod centrilobular & paraseptal emphysema; Small liver cyst & small bilat renal cysts noted...  Spirometry 06/10/16>  FVC=4.10 (112%), FEV1=2.00 (76%), %1sec=49%, mid-flows reduced at 32% predicted...  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  ~  October 09, 2016:  45mo ROV & add-on appt requested for syncopal episode>  Umberto describes a mild COPD exac recently and ~10d ago he was seen by PA in DrTisovec's office & given ZPak & Pred pack; he also states that he hasn't been sleeping well at night due to back pain & c/o urinary  frequency (having to get up about 10x per night which he thinks is related to Lyrica making his ankles swell & his HCT diuretic); then about 4 nights ago he got up to urinate and doesn't remember what happened next but he appears to have had a syncopal episode & awoke a short time late on the floor of his bedroom having lacerated his forehead & bled on the carpet, plus 2 black eyes; he had a remote hx of cough syncope & wondered if this might have occurred again but clearly this episode was very different than previous (ie- previously he had a severe coughing paroxysm that lead to syncope from the valsalva, and this episode did not involve coughing paroxysm in fact he noted that legs were very weak due to his spinal condition & sounds like he fell- hit his head w/ prob concussion- and awoke a short time later w/ amnesia for the actual fall); Note- he did not call 911 or go to the ER or urgent care facility; he called DrTisovec's office and was referred here for pulmonary eval due to his remote hx of cough syncope... NOTE- DrDumonski plans back surg in ~10 days! 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    Cigarette smoker>  Still smoking 1.5ppd and he has smoked 1-1.5ppd for ~60 yrs; he declines  smoking cessation help & is not motivated to quit despite knowing the risks...    Hx cough syncope> past hx syncope w/ severe coughing paroxysms while on an ACE inhibitor- aware    Cardiac issues> HBP, CAD, HL- followed by DrTilley (no notes in Epic, pt reports last seen 09/2016 &  cleared for back surg w/ neg nuclear study)- on ASA/Plavix75, Diovan160, HCT12.5, Lip80, Tricor145; he quit cardiac rehab maintenance 06/2016    Medical issues> followed by DrTisovec- Hx dysphagia/ stricture/ Barrett's (on Prilosec40), prostate and bladder problems (on Myrbetriq & Cialis), severe LBP & s/p lumbar lam (on Vicodin & Lyrica), anxiety/depression EXAM shows Afeb, VSS, O2sat=98% on RA;  HEENT- dental issues, mallampati2;  Chest- congested cough w/ scat basilar rhonchi, decr BS bilat w/o w/r/consolidation;  Heart- RR w/o m/r/g;  Abd- soft, nontender, neg;  Ext- w/o c/c/e.   CXR in [place] 08/02/16 (independently reviewed by me in the PACS system) showed norm heart size, atherosclerotic ao, clear lungs- NAD,   LABS in Epic 08/2016>  Chems showed hyponatremia w/ Na=132, & renal insuffic w/ BUN=38, Cr=2.04;  CBC is wnl w/ Hg=13.0.Marland KitchenMarland Kitchen  IMP/PLAN>>  Eston's pulmonary situation remains the same- mixed COPD w/ emphysematous and chr bronchitic components, still smoking 1.5ppd, using his Symbicort (but I suspect not regularly), refuses addition of a LAMA med that would be additionally beneficial in the long run;  He improved after Zithromax & Pred pack per DrTisovec recently & we will place him on a sl longer course of oral PRED (see AVS), plus maximize mucolytic therapy w/ GFN400-2Qid + fluids;  His recent ?syncopal spell does not appear to be cough related- perhaps a fall from leg weakness, head trauma w/ concussion & retrograde amnesia for the fall- in any event he did not go to the ER & is in need of concussion eval/ CT vs MRI head (he is on ASA/Plavix)/ etc; pt has an ASAP appt w/ his PCP in the AM... I am concerned regarding his  surgical risk.  NOTE: >50% of this 8min visit was spent in counseling & coordination of care.  ~  November 28, 2016:  6wk ROV & post hosp check>  Ruari has a hx of prev back surgery & was [facility] 2/15 - 10/20/16 by Ortho- DrDumonski for Lumbar laminectomy & fusion (rods & screws) w/ allograft due to severe unremitting pain;  He underwent post-op rehab & improved (still using walker), then fell 11/03/16 & was seen in the ER- XRays were ok w/o acute changes, he continues to f/u w/ DrDumonski- on Percocet & muscle relaxers... He is under a lot of stress as well since his 74 y/o yellow lab "Festus Holts" is at the end of her life...    COPD,emphysema> on Symbicort160 & AlbutHFA as needed; he says his breathing is ok, notes some watery nasal drainage (try Astelin), min cough w/ min thick sput (no discoloration or blood)...     Pulm nodules> aware, see above...    Cigarette smoker> still smoking 1ppd, and can't vs won't quit... EXAM shows Afeb, VSS, O2sat=98% on RA;  He has lost 13# down to 147# today (not eating, no appetite);  HEENT- dental issues, mallampati2;  Chest- sl congested cough w/ scat basilar rhonchi, decr BS bilat w/o w/r/consolidation;  Heart- RR w/o m/r/g;  Abd- soft, nontender, neg;  Ext- w/o c/c/e.   LABS 12/02/16>  Chems- ok x BS=112;  CBC- ok w/ Hg=12.4;  TSH= 1.00 IMP/PLAN>>  Hilman is rec to add a nutritional supplements to his regimen, try Astelin NS for the runny nose, and continue the [employer];  But most importantly he must committ to smoking cessation!! Continue same meds...    ~  March 03, 2017:  37mo ROV & pulm f/u visit> Ronalee Belts reports that the prev back fusion "didn't fuse" & DrDumonski is considering  further surg but wants to wait 66mo (his note of 02/03/17 is reviewed);  He is wearing a T-brace & remains on Tizanidine & Vicodin but has stopped the Lyrica due to swelling;  He got some temporary relief from a CT-guided left SI joint injection on 02/14/17;  From the pulmonary standpoint-- [place]  states that his breathing has been OK, notes intermit cough, small amt clear sput & chest congestion but denies wheezing/ chest tightness/ CP;  He is smoking 1-1.5ppd even noting that the e-cig was w/o help-- "it's the only way I got thru this"... He remains on Symbicort160-2spBid, VentolinHFA rescue as needed (using several times daily);  We discussed adding MUCINEX/ Gen GFN 400mg -2Tid w/ fluids for the congestion/ thick phlegm/ mucus plugging...    EXAM shows Afeb, VSS, O2sat=98% on RA;  He has lost more wt down to 137# today (not eating, no appetite);  HEENT- dental issues, mallampati2;  Chest- sl congested cough w/ scat basilar rhonchi, decr BS bilat w/o w/r/consolidation;  Heart- RR w/o m/r/g;  Abd- soft, nontender, neg;  Ext- w/o c/c/e.  IMP/PLAN>>  Kawika is again encouraged to cut back & quit smoking- options for medications, nicotine replacement, counseling, hypnosis, etc discussed but he is not interested;  Continue Symbicort & Ventolin, add GFN as discussed and do the best he can w/ exercise... NOTE:  >50% of this 59min ov was spent in counseling & coordination of care...    Past Medical History:  Diagnosis Date  . Allergy    seasonal  . Arthritis   . Barrett esophagus   . BPH (benign prostatic hyperplasia)   . Bronchitis    recently   . Bruise    forehead and under both eyes  . CAD (coronary artery disease)    takes Plavix and Aspirin daily  . Chronic back pain   . COPD (chronic obstructive pulmonary disease) (HCC)    Albuterol inhaler as needed.Uses Symbicort daily  . Depression    doesn't take any meds  . Emphysema lung ([place])   . Fall    fell on 10/06/16 with loss of consiousness for a short time.States he remembers getting up but remembers nothing else.  Marland Kitchen GERD (gastroesophageal reflux disease)    takes Omeprazole daily  . History of colon polyps    benign  . History of kidney stones   . Hyperlipidemia    takes Fenofibrate and Lipitor daily  . Hypertension    takes  HCTZ and Diovan daily  . Joint pain   . Joint swelling   . 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 Neuropathy    Lyrica every couple of days.  . Nocturia   . Pneumonia    hx of  . PONV (postoperative nausea and vomiting)   . Skin cancer   . Stroke [facility]) 1995   Affected balance; and has very emotional if watching a movie  . Syncope    yr ago  . Urgency of urination    takes Myrbetriq daily    Past Surgical History:  Procedure Laterality Date  . APPENDECTOMY    . CARDIAC CATHETERIZATION     x 4. Most recent one in 2011 at [facility](3 previous were done 20 yrs before)  . cataracts Bilateral   . COLONOSCOPY    . CYSTOSCOPY  07/21/2013   Dr. Jeffie Pollock  . DENTAL SURGERY     with sedation  . ESOPHAGEAL MANOMETRY N/A 09/20/2015  Procedure: ESOPHAGEAL MANOMETRY (EM);  Surgeon: Manus Gunning, MD;  Location: [facility];  Service: Gastroenterology;  Laterality: N/A;  . LUMBAR LAMINECTOMY/DECOMPRESSION MICRODISCECTOMY N/A 01/12/2015   Procedure: LUMBAR LAMINECTOMY/DECOMPRESSION MICRODISCECTOMY 3 LEVELS;  Surgeon: Phylliss Bob, MD;  Location: [place];  Service: Orthopedics;  Laterality: N/A;  Lumbar 3-4, lumbar 4-5, lumbar 5-sacrum 1 decompression  . parathyroid adenoma removed    . POLYPECTOMY    . ROTATOR CUFF REPAIR     bilateral  . TONSILLECTOMY    . UPPER GASTROINTESTINAL ENDOSCOPY      Outpatient Encounter Prescriptions as of 03/03/2017  Medication Sig  . acetaminophen (TYLENOL) 500 MG tablet Take 1,000 mg by mouth every 6 (six) hours as needed (for back pain.).  Marland Kitchen albuterol (PROVENTIL HFA;VENTOLIN HFA) 108 (90 BASE) MCG/ACT inhaler Inhale 2 puffs into the lungs every 6 (six) hours as needed for wheezing or shortness of breath. Reported on 11/29/2015  . aspirin EC 81 MG tablet Take 81 mg by mouth daily.  Marland Kitchen atorvastatin (LIPITOR) 80 MG tablet Take 80 mg by mouth daily.   . clopidogrel (PLAVIX) 75 MG tablet Take 75 mg by mouth daily.   .  fenofibrate (TRICOR) 145 MG tablet Take 145 mg by mouth daily.   . folic acid (FOLVITE) 308 MCG tablet Take 400 mcg by mouth daily.  . hydrochlorothiazide (HYDRODIURIL) 12.5 MG tablet Take 12.5 mg by mouth daily.  Marland Kitchen HYDROcodone-acetaminophen (NORCO/VICODIN) 5-325 MG tablet Take 0.5-1 tablets by mouth 2 (two) times daily as needed for severe pain.  Marland Kitchen LYRICA 75 MG capsule Take 75 mg by mouth daily.  Marland Kitchen MYRBETRIQ 50 MG TB24 tablet Take 50 mg by mouth daily.   Marland Kitchen NITROSTAT 0.4 MG SL tablet Place 0.4 mg under the tongue every 5 (five) minutes as needed for chest pain. Reported on 11/29/2015  . omeprazole (PRILOSEC) 40 MG capsule TAKE ONE CAPSULE BY MOUTH TWICE DAILY  . SYMBICORT 160-4.5 MCG/ACT inhaler INHALE TWO PUFFS INTO LUNGS TWICE DAILY  . tadalafil (CIALIS) 5 MG tablet Take 5 mg by mouth daily. For BPH  . valsartan (DIOVAN) 160 MG tablet Take 160 mg by mouth daily.    Allergies  Allergen Reactions  . Ivp Dye [Iodinated Diagnostic Agents] Hives and Shortness Of Breath    reaction was when pt was 76 years old.  Clancy Gourd [Nitrofurantoin] Other (See Comments)    Fever that lasted several months    Immunization History  Administered Date(s) Administered  . Influenza Split 05/14/2012  . Influenza Whole 06/02/2010, 05/09/2011  . Influenza, High Dose Seasonal PF 06/17/2016  . Influenza,inj,Quad PF,36+ Mos 06/02/2013, 05/24/2014  . Influenza-Unspecified 05/31/2015  . Pneumococcal Polysaccharide-23 06/02/2009  . Tdap 10/29/2014  HE WILL CHECK w/ DrTISOVEC regarding PREVNAR-13 vaccination...   Current Medications, Allergies, Past Medical History, Past Surgical History, Family History, and Social History were reviewed in [employer] record.   Review of Systems        All symptoms NEG except where BOLDED >>  Constitutional:  F/C/S, fatigue, anorexia, unexpected weight change. HEENT:  HA, visual changes, hearing loss, earache, nasal symptoms, sore throat, mouth  sores, hoarseness. Resp:  cough, sputum, hemoptysis; SOB, tightness, wheezing. Cardio:  CP, palpit, DOE, orthopnea, edema. GI:  N/V/D/C, blood in stool; reflux, abd pain, distention, gas. GU:  dysuria, freq, urgency, hematuria, flank pain, voiding difficulty. MS:  joint pain, swelling, tenderness, decr ROM; neck pain, back pain, etc. Neuro:  HA, tremors, seizures, dizziness, syncope, weakness, numbness, gait abn. Skin:  suspicious lesions or skin rash. Heme:  adenopathy, bruising, bleeding. Psyche:  confusion, agitation, sleep disturbance, hallucinations, anxiety, depression suicidal.     Objective:   Physical Exam    Vital Signs:  Reviewed...   General:  WD, WN, 76 y/o WM chr ill appearing; alert & oriented; pleasant & cooperative... HEENT:  Forehead laceration, bilat black eyes; Conjunctiva- red, Sclera- nonicteric, EOM-wnl, PERRLA; EACs-clear, TMs-wnl; NOSE-clear; THROAT- sl red Neck:  Supple w/ decr ROM; no JVD; normal carotid impulses w/o bruits; no thyromegaly or nodules palpated; no lymphadenopathy.  Chest:  Decr BS bilat w/ few scat rhonchi at bases, congested cough, no wheezing/ rales/ or consolidation... Heart:  Regular Rhythm; norm S1 & S2 without murmurs, rubs, or gallops detected. Abdomen:  Soft & nontender- no guarding or rebound; normal bowel sounds; no organomegaly or masses palpated. Ext:  Decr ROM; without deformities, +arthritic changes; no varicose veins, venous insuffic, or edema;  Pulses intact w/o bruits. Neuro:  CNs II-XII intact; motor testing normal; sensory testing normal; +gait abn, sl leg weakness, balance is off... Derm:  No lesions noted x bilat ecchymoses periorbitally... Lymph:  No cervical, supraclavicular, axillary, or inguinal adenopathy palpated.    Assessment & Plan:    IMP >>     COPD> on Symbicort160-2spBid, ProventilHFA prn; he never filled the Incruse or Spiriva ($$); rec to add GFN400-2Qid + fluids...    Pulm nodules on CT Chest> Yearly f/u  low-dose screening CT done 06/2016- mult tiny calcif nodules c/w old granulomatous dis + 4-81mm LUL nodule- stable (see report)...    Cigarette smoker>  Still smoking 1.5ppd and he has smoked 1-1.5ppd for ~60 yrs; he declines smoking cessation help & is not motivated to quit...    Hx cough syncope> this occurred remotely w/ severe coughing paroxysm while he was on an ACE inhib...    CARDIAC issues> HBP, CAD, HL- followed by DrTilley (no notes in Epic)- on ASA/Plavix75, Diovan160, HCT12.5, Lip80, Tricor145...    MEDICAL ISSUES>        Dysphagia, hx esoph stricture & Barrett's esoph> on Prilosec40; eval by DrKaplan in past w/ hx dilatation in the past, now followed by DrArmbruster...       GU issues> on Myrbetriq50, Cialis5       Ortho- LBP w/ lumbar lam 2016 by DrDumonski who now plans further extensive surg...       Anxiety/depression> prev on WellbutrinXL150; his son is treated for depression, friend died w/ metastatic lung cancer  PLAN >>   06/07/15>   Ger & I discussed his continued smoking & he is not motivated to quit- offered meds and he will call if he changes his mind;  Rec to continue Symbicort160-2spBid, he was tried on Spiriva respimat in past but wouldn't fill it due to cost- I pointed out that if he'd quit smoking he could get the Spiriva & noted that his breathing would be better due to both things! Rec to add Mucinex 2400mg /d.Marland KitchenMarland KitchenWe plan ROV 11months. 12/07/15>   [place] is unable to quit smoking and has mod COPD w/ emphysema on his CTscans- we will add INCRUSE one inhalation daily; he knows to avoid infections & be sure he is up to date on all vaccinations from DrTisovec. 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; same Rx & we plan ROV 58mo 10/09/16>   Matti's pulmonary situation remains the same- mixed COPD w/ emphysematous and chr bronchitic components, still smoking 1.5ppd, using his Symbicort (but I suspect not regularly), refuses addition of a LAMA med that would be additionally beneficial in the long run;  He improved after Zithromax & Pred pack per DrTisovec recently & we will place him on a sl longer course of oral PRED (see AVS), plus maximize mucolytic therapy w/ [id number] + fluids;  His recent ?syncopal spell does not appear to be cough related- perhaps a fall from leg weakness, head trauma w/ concussion & retrograde amnesia for the fall- in any event he did not go to the ER & is in need of concussion eval/ CT vs MRI head (he is on ASA/Plavix)/ etc; pt has an ASAP appt w/ his PCP in the AM... I am concerned regarding his surgical risk. 11/28/16>   Piper is rec to add a nutritional supplements to his regimen, try Astelin NS for the runny nose, and continue the [employer];  But most importantly he must committ to smoking cessation!! Continue same meds 03/03/17>   Narada is again encouraged to cut back & quit smoking- options for medications, nicotine replacement, counseling, hypnosis, etc discussed but he is not interested;  Continue Symbicort & Ventolin, add GFN as discussed and do the best he can w/ exercise.   Patient's Medications  New Prescriptions   No medications on file  Previous Medications   ACETAMINOPHEN (TYLENOL) 500 MG TABLET    Take 1,000 mg by mouth every 6 (six) hours as needed (for back pain.).   ALBUTEROL (PROVENTIL HFA;VENTOLIN HFA) 108 (90 BASE) MCG/ACT INHALER    Inhale 2 puffs into the lungs every 6 (six) hours as needed for wheezing or shortness of breath. Reported on 11/29/2015   ATORVASTATIN (LIPITOR) 80 MG TABLET    Take 80 mg by mouth daily.    FENOFIBRATE (TRICOR) 145 MG TABLET    Take 145 mg by mouth  daily.    FOLIC ACID (FOLVITE) 643 MCG TABLET    Take 400 mcg by mouth daily.   HYDROCODONE-ACETAMINOPHEN (NORCO/VICODIN) 5-325 MG TABLET    Take 1 tablet by mouth 2 (two) times daily.   MYRBETRIQ 50 MG TB24 TABLET    Take 50 mg by mouth daily.    NITROSTAT 0.4 MG SL TABLET    Place 0.4 mg under the tongue every 5 (five) minutes as needed for chest pain. Reported on 11/29/2015   OMEPRAZOLE (PRILOSEC) 40 MG CAPSULE    TAKE ONE CAPSULE BY MOUTH TWICE DAILY   SYMBICORT 160-4.5 MCG/ACT INHALER    INHALE TWO PUFFS INTO LUNGS TWICE DAILY   TADALAFIL (CIALIS) 5 MG TABLET    Take 5 mg by mouth daily. For BPH   TIZANIDINE (ZANAFLEX) 4 MG CAPSULE    Take 4 mg by mouth 4 (four) times daily.   VALSARTAN (DIOVAN) 160 MG TABLET    Take 160 mg by mouth daily.  Modified Medications   No medications on file  Discontinued Medications   AZELASTINE (ASTELIN) 0.1 % NASAL SPRAY    Use 1-2 sprays in each nostril two times daily as needed   HYDROCHLOROTHIAZIDE (HYDRODIURIL) 12.5 MG TABLET    Take 12.5 mg by mouth daily.   LYRICA 75 MG CAPSULE  Take 75 mg by mouth daily.

## 2017-08-04 ENCOUNTER — Encounter: Payer: Self-pay | Admitting: Pulmonary Disease

## 2017-08-04 ENCOUNTER — Other Ambulatory Visit (INDEPENDENT_AMBULATORY_CARE_PROVIDER_SITE_OTHER): Payer: Medicare Other

## 2017-08-04 ENCOUNTER — Ambulatory Visit: Payer: Medicare Other | Admitting: Pulmonary Disease

## 2017-08-04 VITALS — BP 94/60 | HR 91 | Temp 98.0°F | Ht 65.0 in | Wt 136.0 lb

## 2017-08-04 DIAGNOSIS — I2510 Atherosclerotic heart disease of native coronary artery without angina pectoris: Secondary | ICD-10-CM

## 2017-08-04 DIAGNOSIS — J449 Chronic obstructive pulmonary disease, unspecified: Secondary | ICD-10-CM

## 2017-08-04 DIAGNOSIS — I951 Orthostatic hypotension: Secondary | ICD-10-CM

## 2017-08-04 DIAGNOSIS — G8929 Other chronic pain: Secondary | ICD-10-CM | POA: Diagnosis not present

## 2017-08-04 DIAGNOSIS — I10 Essential (primary) hypertension: Secondary | ICD-10-CM

## 2017-08-04 DIAGNOSIS — F341 Dysthymic disorder: Secondary | ICD-10-CM | POA: Diagnosis not present

## 2017-08-04 DIAGNOSIS — Z122 Encounter for screening for malignant neoplasm of respiratory organs: Secondary | ICD-10-CM | POA: Diagnosis not present

## 2017-08-04 DIAGNOSIS — M5416 Radiculopathy, lumbar region: Secondary | ICD-10-CM

## 2017-08-04 DIAGNOSIS — M549 Dorsalgia, unspecified: Secondary | ICD-10-CM | POA: Diagnosis not present

## 2017-08-04 DIAGNOSIS — F17210 Nicotine dependence, cigarettes, uncomplicated: Secondary | ICD-10-CM

## 2017-08-04 DIAGNOSIS — Z981 Arthrodesis status: Secondary | ICD-10-CM

## 2017-08-04 DIAGNOSIS — R42 Dizziness and giddiness: Secondary | ICD-10-CM

## 2017-08-04 LAB — CBC WITH DIFFERENTIAL/PLATELET
BASOS PCT: 0.4 % (ref 0.0–3.0)
Basophils Absolute: 0 10*3/uL (ref 0.0–0.1)
EOS ABS: 0 10*3/uL (ref 0.0–0.7)
Eosinophils Relative: 0.2 % (ref 0.0–5.0)
HEMATOCRIT: 37.2 % — AB (ref 39.0–52.0)
HEMOGLOBIN: 12.6 g/dL — AB (ref 13.0–17.0)
LYMPHS PCT: 18.9 % (ref 12.0–46.0)
Lymphs Abs: 1.6 10*3/uL (ref 0.7–4.0)
MCHC: 34 g/dL (ref 30.0–36.0)
MCV: 102.5 fl — AB (ref 78.0–100.0)
MONOS PCT: 9.1 % (ref 3.0–12.0)
Monocytes Absolute: 0.8 10*3/uL (ref 0.1–1.0)
NEUTROS ABS: 6 10*3/uL (ref 1.4–7.7)
Neutrophils Relative %: 71.4 % (ref 43.0–77.0)
PLATELETS: 412 10*3/uL — AB (ref 150.0–400.0)
RBC: 3.63 Mil/uL — ABNORMAL LOW (ref 4.22–5.81)
RDW: 14.8 % (ref 11.5–15.5)
WBC: 8.4 10*3/uL (ref 4.0–10.5)

## 2017-08-04 LAB — SEDIMENTATION RATE: SED RATE: 29 mm/h — AB (ref 0–20)

## 2017-08-04 LAB — BASIC METABOLIC PANEL
BUN: 23 mg/dL (ref 6–23)
CHLORIDE: 94 meq/L — AB (ref 96–112)
CO2: 25 mEq/L (ref 19–32)
CREATININE: 1.6 mg/dL — AB (ref 0.40–1.50)
Calcium: 9.3 mg/dL (ref 8.4–10.5)
GFR: 45.1 mL/min — ABNORMAL LOW (ref >60.00)
GLUCOSE: 89 mg/dL (ref 70–99)
POTASSIUM: 3.5 meq/L (ref 3.5–5.1)
Sodium: 132 mEq/L — ABNORMAL LOW (ref 135–145)

## 2017-08-04 NOTE — Progress Notes (Signed)
Subjective:    Patient ID: Brad Turner, male    DOB: 1941/03/15, 76 y.o.   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    CT Chest 09/17/13>  Clearing of prior RLL opac, 3 small nodules on the left- unchanged from 2011, severe emphysematous change, pos coronary calcif  Spirometry 11/22/13 at [facility]> FVC=3.26 (89%), FEV1=1.84 (69%), %1sec=56;  This is c/w a moderate obstructive ventilatory defect  V/Q Lung Scan 10/29/14>  Patchy nonsegmental ventilation defects, patchy scattered nonsegmental perfusion defects, felt to be a low prob scan...  CXR 10/29/14>  Heart is upper lim of norm, hyperinflated/ emphysematous lungs- NAD, DJD spine...  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     Cigarette smoker>  Still smoking 1.5ppd and he has smoked 1-1.5ppd for ~60 yrs; he declines smoking cessation help & is not motivated to quit...    Hx cough syncope> aware    CARDIAC issues> HBP, CAD, HL- followed by DrTilley (no notes in Epic)- on Plavix75, Amlod5-1/2, Diovan80, Lip80, [phone]; he remains in cardiac rehab regularly...    Dysphagia, hx esoph stricture & Barrett's esoph> on Dexilant60; eval by DrKaplan w/ hx dilatation in the past    GU issues> on Myrbetriq50, Cialis5    Ortho- LBP w/ lumbar lam 2016 by DrDumonski    Anxiety/depression> on WellbutrinXL150; his  son is treated for depression, friend died w/ metastatic lung cancer EXAM shows Afeb, VSS, O2sat=94% on RA;  HEENT- dental issues, mallampati2;  Chest- decr BS bilat w/o w/r/r;  Heart- RR w/o m/r/g;  Abd- soft, nontender, neg;  Ext- w/o c/c/e;  Neuro- intact...  Low dose screening CT Chest10/14/16>  Norm heart size, coronary & aortic calcif, 40mm noncalcif nodule in LUL, scat calcif granulomas up to 2.26mm; mod centrilobular & paraseptal emphysema, abd is ok w/ 2cm cyst in liver...  IMP/PLAN>>  Legrand Como & I discussed his continued smoking & he is not motivated to quit- offered meds and he will call if he changes his mind;  Rec to continue Symbicort160-2spBid, he was tried on Spiriva respimat in past but wouldn't fill it due to cost- I pointed out that if he'd quit smoking he could get the Spiriva & noted that his breathing would be better due to both things! Rec to add Mucinex 2400mg /d.Marland KitchenMarland KitchenWe plan ROV 43months.  ~  December 07, 2015:  75mo ROV w/ SN>  PCP- DrTisovec, Cards- DrTilley, GI- DrArmbruster...    Brad Turner has had some recent dental work w/ mult teeth pulled; he remains in cardiac rehab maintenance program and doing well; no new complaints or concerns; he is using his Symbicort160-2spBid, not using the Mucinex; he has chr stable DOE, min cough, small amt beige sput, no CP/ palpit/ edema...    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     Cigarette smoker>  Still smoking 1.5ppd and he has smoked 1-1.5ppd for ~60 yrs; he declines smoking cessation help & is not motivated to quit...    Hx cough syncope> aware    CARDIAC issues> HBP, CAD, HL- followed by DrTilley (no notes in Epic)- on Plavix75, Amlod5-1/2, Diovan80, Lip80, [phone]; he remains in cardiac rehab regularly... EXAM shows Afeb, VSS, O2sat=92% on RA;  HEENT- dental issues, mallampati2;  Chest- decr BS  bilat w/o w/r/r;  Heart- RR w/o m/r/g;  Abd- soft, nontender, neg;  Ext- w/o c/c/e;  Neuro- intact... IMP/PLAN>>  [name] is unable to quit smoking and has mod COPD w/ emphysema on his CTscans- we will add INCRUSE one inhalation daily; he knows to avoid infections & be sure he is up to date on all vaccinations from DrTisovec... we plan ROV 56mo at which time he will need his next screening CT Chest...  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 He had a recent trip to [place] & did ok w/ walking & breathing but had some back discomfort that was more limiting; he saw DrDumonski recently w/ talk of rods and screws "but I'm too high risk" and they are considering trial of nerve blocks... Of interest he noted that his 19 y/o son (adopted) had a baby daughter recently Dx w/ CF... 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    Cigarette smoker>  Still smoking 1.5ppd and he has smoked 1-1.5ppd for ~60 yrs; he declines smoking cessation help & is not motivated to quit...    Hx cough syncope> past hx syncope w/ severe coughing  paroxysms- aware    Cardiac issues> HBP, CAD, HL- followed by DrTilley (no notes in Epic, pt reports last seen 02/2016)- on Plavix75, Amlod5-1/2, Diovan80, Lip80, Tricor145; he remains in cardiac rehab regularly...    Medical issues> followed by DrTisovec- Hx dysphagia/ stricture/ Barrett's, LBP- s/p lumbar lam, anxiety/depression EXAM shows Afeb, VSS, O2sat=98% on RA;  HEENT- dental issues, mallampati2;  Chest- decr BS bilat w/o w/r/r;  Heart- RR w/o m/r/g;  Abd- soft, nontender, neg;  Ext- w/o c/c/e;  Neuro- intact...  CXR 06/10/16>  Norm heart size, atherosclerotic Ao, no adenopathy, stable 24mm opac in LUL is noted- NAD, old trauma to left clavicle...   Low-dose screening CT Chest 06/18/16>  Norm heart size w/ atherosclerotic changes in Ao, great vessels, & coronaries; No adenopathy; Mult tiny pulm nodules bilat, most are calcified & c/w granulomas; Largest non-calcif nodule is 4-73mm  in LUL & no change from 74yr ago- no new lesions; Diffuse bronchial wall thickening, & mod centrilobular & paraseptal emphysema; Small liver cyst & small bilat renal cysts noted...  Spirometry 06/10/16>  FVC=4.10 (112%), FEV1=2.00 (76%), %1sec=49%, mid-flows reduced at 32% predicted...  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  ~  October 09, 2016:  45mo ROV & add-on appt requested for syncopal episode>  Umberto describes a mild COPD exac recently and ~10d ago he was seen by PA in DrTisovec's office & given ZPak & Pred pack; he also states that he hasn't been sleeping well at night due to back pain & c/o urinary  frequency (having to get up about 10x per night which he thinks is related to Lyrica making his ankles swell & his HCT diuretic); then about 4 nights ago he got up to urinate and doesn't remember what happened next but he appears to have had a syncopal episode & awoke a short time late on the floor of his bedroom having lacerated his forehead & bled on the carpet, plus 2 black eyes; he had a remote hx of cough syncope & wondered if this might have occurred again but clearly this episode was very different than previous (ie- previously he had a severe coughing paroxysm that lead to syncope from the valsalva, and this episode did not involve coughing paroxysm in fact he noted that legs were very weak due to his spinal condition & sounds like he fell- hit his head w/ prob concussion- and awoke a short time later w/ amnesia for the actual fall); Note- he did not call 911 or go to the ER or urgent care facility; he called DrTisovec's office and was referred here for pulmonary eval due to his remote hx of cough syncope... NOTE- DrDumonski plans back surg in ~10 days! 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    Cigarette smoker>  Still smoking 1.5ppd and he has smoked 1-1.5ppd for ~60 yrs; he declines  smoking cessation help & is not motivated to quit despite knowing the risks...    Hx cough syncope> past hx syncope w/ severe coughing paroxysms while on an ACE inhibitor- aware    Cardiac issues> HBP, CAD, HL- followed by DrTilley (no notes in Epic, pt reports last seen 09/2016 &  cleared for back surg w/ neg nuclear study)- on ASA/Plavix75, Diovan160, HCT12.5, Lip80, Tricor145; he quit cardiac rehab maintenance 06/2016    Medical issues> followed by DrTisovec- Hx dysphagia/ stricture/ Barrett's (on Prilosec40), prostate and bladder problems (on Myrbetriq & Cialis), severe LBP & s/p lumbar lam (on Vicodin & Lyrica), anxiety/depression EXAM shows Afeb, VSS, O2sat=98% on RA;  HEENT- dental issues, mallampati2;  Chest- congested cough w/ scat basilar rhonchi, decr BS bilat w/o w/r/consolidation;  Heart- RR w/o m/r/g;  Abd- soft, nontender, neg;  Ext- w/o c/c/e.   CXR in [place] 08/02/16 (independently reviewed by me in the PACS system) showed norm heart size, atherosclerotic ao, clear lungs- NAD,   LABS in Epic 08/2016>  Chems showed hyponatremia w/ Na=132, & renal insuffic w/ BUN=38, Cr=2.04;  CBC is wnl w/ Hg=13.0.Marland KitchenMarland Kitchen  IMP/PLAN>>  Greydon's pulmonary situation remains the same- mixed COPD w/ emphysematous and chr bronchitic components, still smoking 1.5ppd, using his Symbicort (but I suspect not regularly), refuses addition of a LAMA med that would be additionally beneficial in the long run;  He improved after Zithromax & Pred pack per DrTisovec recently & we will place him on a sl longer course of oral PRED (see AVS), plus maximize mucolytic therapy w/ GFN400-2Qid + fluids;  His recent ?syncopal spell does not appear to be cough related- perhaps a fall from leg weakness, head trauma w/ concussion & retrograde amnesia for the fall- in any event he did not go to the ER & is in need of concussion eval/ CT vs MRI head (he is on ASA/Plavix)/ etc; pt has an ASAP appt w/ his PCP in the AM... I am concerned regarding his  surgical risk.  NOTE: >50% of this 84min visit was spent in counseling & coordination of care.  ~  November 28, 2016:  6wk ROV & post hosp check>  Andersen has a hx of prev back surgery & was [facility] 2/15 - 10/20/16 by Ortho- DrDumonski for Lumbar laminectomy & fusion (rods & screws) w/ allograft due to severe unremitting pain;  He underwent post-op rehab & improved (still using walker), then fell 11/03/16 & was seen in the ER- XRays were ok w/o acute changes, he continues to f/u w/ DrDumonski- on Percocet & muscle relaxers... He is under a lot of stress as well since his 74 y/o yellow lab "Festus Holts" is at the end of her life...    COPD,emphysema> on Symbicort160 & AlbutHFA as needed; he says his breathing is ok, notes some watery nasal drainage (try Astelin), min cough w/ min thick sput (no discoloration or blood)...     Pulm nodules> aware, see above...    Cigarette smoker> still smoking 1ppd, and can't vs won't quit... EXAM shows Afeb, VSS, O2sat=98% on RA;  He has lost 13# down to 147# today (not eating, no appetite);  HEENT- dental issues, mallampati2;  Chest- sl congested cough w/ scat basilar rhonchi, decr BS bilat w/o w/r/consolidation;  Heart- RR w/o m/r/g;  Abd- soft, nontender, neg;  Ext- w/o c/c/e.   LABS 12/02/16>  Chems- ok x BS=112;  CBC- ok w/ Hg=12.4;  TSH= 1.00 IMP/PLAN>>  Jabe is rec to add a nutritional supplements to his regimen, try Astelin NS for the runny nose, and continue the [employer];  But most importantly he must committ to smoking cessation!! Continue same meds...   ~  March 03, 2017:  74mo ROV & pulm f/u visit> Ronalee Belts reports that the prev back fusion "didn't fuse" & DrDumonski is considering further  surg but wants to wait 67mo (his note of 02/03/17 is reviewed);  He is wearing a T-brace & remains on Tizanidine & Vicodin but has stopped the Lyrica due to swelling;  He got some temporary relief from a CT-guided left SI joint injection on 02/14/17;  From the pulmonary standpoint-- Bela  states that his breathing has been OK, notes intermit cough, small amt clear sput & chest congestion but denies wheezing/ chest tightness/ CP;  He is smoking 1-1.5ppd even noting that the e-cig was w/o help-- "it's the only way I got thru this"... He remains on Symbicort160-2spBid, VentolinHFA rescue as needed (using several times daily);  We discussed adding MUCINEX/ Gen GFN 400mg -2Tid w/ fluids for the congestion/ thick phlegm/ mucus plugging...    EXAM shows Afeb, VSS, O2sat=98% on RA;  He has lost more wt down to 137# today (not eating, no appetite);  HEENT- dental issues, mallampati2;  Chest- sl congested cough w/ scat basilar rhonchi, decr BS bilat w/o w/r/consolidation;  Heart- RR w/o m/r/g;  Abd- soft, nontender, neg;  Ext- w/o c/c/e.  IMP/PLAN>>  Minard is again encouraged to cut back & quit smoking- options for medications, nicotine replacement, counseling, hypnosis, etc discussed but he is not interested;  Continue Symbicort & Ventolin, add GFN as discussed and do the best he can w/ exercise...      NOTE:  >50% of this 61min ov was spent in counseling & coordination of care...   ~  August 04, 2017:  46mo ROV & Eirik's breathing is about the same- still smoking ~2ppd w/ cough, thick clear mucus, some wheezing;  He feels that his Tomasita Crumble is more limited by his LBP than his breathing;  Today his CC is dizziness w/ postural BP changes that he thinks is related to diarrhea from [place] food- Rec to take gatorade & use imodium/ align/ activia yogurt;  BP in office today w/ supine BP= 104/70, and standing BP= 80/50...  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     Pulm nodules on CT  Chest> Yearly f/u low-dose screening CT done 06/2016- mult tiny calcif nodules c/w old granulomatous dis + 4-30mm LUL nodule- stable (see full report) & f/u due now (see below)...    Cigarette smoker>  Still smoking 1.5-2ppd and he has smoked 1-1.5ppd for ~60 yrs; he declines smoking cessation help & is not motivated to quit despite knowing the risks & his love of the trumpet...    Hx cough syncope> past hx syncope w/ severe coughing paroxysms while on an ACE inhibitor- aware    Cardiac issues> HBP, CAD, HL- followed by DrTilley (no notes in Epic), pt reports last seen 09/2016 & cleared for back surg w/ neg nuclear study- on ASA/Plavix75, Diovan160, HCT12.5, Lip80, Tricor145; he quit cardiac rehab maintenance 06/2016    Medical issues> followed by DrTisovec- Hx dysphagia/ stricture/ Barrett's (on Prilosec40), prostate and bladder problems (on Myrbetriq & Cialis), severe LBP & s/p lumbar lam (on Vicodin & Zanaflex & Cymbalta), dysthymia...  EXAM shows Afeb, postural BP changes noted, O2sat=95% on RA;  HEENT- dental issues, mallampati2;  Chest- congested cough w/ scat basilar rhonchi, decr BS bilat w/o w/r/consolidation;  Heart-  RR w/o m/r/g;  Abd- soft, nontender, neg;  Ext- w/o c/c/e.   LABS 08/04/17>  Chems-  Na=132, Cr=1.6;  CBC- Hg=12.6, WBC=8.4;  Sed=29  Low-dose screening CT Chest 08/07/17>  Norm heart size, aortic & coronary calcif, no adenopathy, lungs w/ centrilob & paraseptal emphysema plus peribronch thickening, scat tiny nodules, many calcif c/w granulomas, no suspicious nodules or masses, no focal disease, stable appearance of hepatic & renal cysts... IMP/PLAN>>  As prev discussed on numerous occais- he needs to quit all smoking but is not motivated & refuses smoking cessation help etc;  Continue Symbicort160-2spBid + AlbutHFA rescue inhaler, he refuses the addition of a LAMA to his regimen;  For the diarrhea, postural hypotension- avoid [place] food 7 MSG, take gatorade/ imodium/ align/ [place]  and f/u w/ PCP- DrTisovec...     Past Medical History:  Diagnosis Date  . Allergy    seasonal  . Arthritis   . Barrett esophagus   . BPH (benign prostatic hyperplasia)   . Bronchitis    recently   . Bruise    forehead and under both eyes  . CAD (coronary artery disease)    takes Plavix and Aspirin daily  . Chronic back pain   . COPD (chronic obstructive pulmonary disease) (HCC)    Albuterol inhaler as needed.Uses Symbicort daily  . Depression    doesn't take any meds  . Emphysema lung ([place])   . Fall    fell on 10/06/16 with loss of consiousness for a short time.States he remembers getting up but remembers nothing else.  Marland Kitchen GERD (gastroesophageal reflux disease)    takes Omeprazole daily  . History of colon polyps    benign  . History of kidney stones   . Hyperlipidemia    takes Fenofibrate and Lipitor daily  . Hypertension    takes HCTZ and Diovan daily  . Joint pain   . Joint swelling   . 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 Neuropathy    Lyrica every couple of days.  . Nocturia   . Pneumonia    hx of  . PONV (postoperative nausea and vomiting)   . Skin cancer   . Stroke [facility]) 1995   Affected balance; and has very emotional if watching a movie  . Syncope    yr ago  . Urgency of urination    takes Myrbetriq daily    Past Surgical History:  Procedure Laterality Date  . APPENDECTOMY    . CARDIAC CATHETERIZATION     x 4. Most recent one in 2011 at [facility](3 previous were done 20 yrs before)  . cataracts Bilateral   . COLONOSCOPY    . CYSTOSCOPY  07/21/2013   Dr. Jeffie Pollock  . DENTAL SURGERY     with sedation  . ESOPHAGEAL MANOMETRY N/A 09/20/2015   Procedure: ESOPHAGEAL MANOMETRY (EM);  Surgeon: Manus Gunning, MD;  Location: [facility];  Service: Gastroenterology;  Laterality: N/A;  . LUMBAR LAMINECTOMY/DECOMPRESSION MICRODISCECTOMY N/A 01/12/2015   Procedure: LUMBAR LAMINECTOMY/DECOMPRESSION MICRODISCECTOMY 3  LEVELS;  Surgeon: Phylliss Bob, MD;  Location: [place];  Service: Orthopedics;  Laterality: N/A;  Lumbar 3-4, lumbar 4-5, lumbar 5-sacrum 1 decompression  . parathyroid adenoma removed    . POLYPECTOMY    . ROTATOR CUFF REPAIR     bilateral  . TONSILLECTOMY    . UPPER GASTROINTESTINAL ENDOSCOPY      Outpatient Encounter Medications as of 08/04/2017  Medication Sig  .  acetaminophen (TYLENOL) 500 MG tablet Take 1,000 mg by mouth every 6 (six) hours as needed (for back pain.).  Marland Kitchen albuterol (PROVENTIL HFA;VENTOLIN HFA) 108 (90 BASE) MCG/ACT inhaler Inhale 2 puffs into the lungs every 6 (six) hours as needed for wheezing or shortness of breath. Reported on 11/29/2015  . atorvastatin (LIPITOR) 80 MG tablet Take 80 mg by mouth daily.   . clopidogrel (PLAVIX) 75 MG tablet Take 75 mg by mouth daily.  . DULoxetine (CYMBALTA) 60 MG capsule Take 60 mg by mouth daily.  . fenofibrate (TRICOR) 145 MG tablet Take 145 mg by mouth daily.   . folic acid (FOLVITE) 433 MCG tablet Take 400 mcg by mouth daily.  . hydrochlorothiazide (MICROZIDE) 12.5 MG capsule Take 12.5 mg by mouth daily.  Marland Kitchen MYRBETRIQ 50 MG TB24 tablet Take 50 mg by mouth daily.   Marland Kitchen NITROSTAT 0.4 MG SL tablet Place 0.4 mg under the tongue every 5 (five) minutes as needed for chest pain. Reported on 11/29/2015  . omeprazole (PRILOSEC) 40 MG capsule TAKE ONE CAPSULE BY MOUTH TWICE DAILY  . SYMBICORT 160-4.5 MCG/ACT inhaler INHALE TWO PUFFS INTO LUNGS TWICE DAILY  . tadalafil (CIALIS) 5 MG tablet Take 5 mg by mouth daily. For BPH  . valsartan (DIOVAN) 160 MG tablet Take 160 mg by mouth daily.  Marland Kitchen HYDROcodone-acetaminophen (NORCO/VICODIN) 5-325 MG tablet Take 1 tablet by mouth 2 (two) times daily.  Marland Kitchen tiZANidine (ZANAFLEX) 4 MG capsule Take 4 mg by mouth 4 (four) times daily.   No facility-administered encounter medications on file as of 08/04/2017.      Allergies  Allergen Reactions  . Ivp Dye [Iodinated Diagnostic Agents] Hives and Shortness Of  Breath    reaction was when pt was 76 years old.  Clancy Gourd [Nitrofurantoin] Other (See Comments)    Fever that lasted several months    Immunization History  Administered Date(s) Administered  . Influenza Split 05/14/2012  . Influenza Whole 06/02/2010, 05/09/2011  . Influenza, High Dose Seasonal PF 06/17/2016, 06/04/2017  . Influenza,inj,Quad PF,6+ Mos 06/02/2013, 05/24/2014  . Influenza-Unspecified 05/31/2015  . Pneumococcal Conjugate-13 09/02/2014  . Pneumococcal Polysaccharide-23 06/02/2009  . Tdap 10/29/2014  HE WILL CHECK w/ DrTISOVEC regarding PREVNAR-13 vaccination...   Current Medications, Allergies, Past Medical History, Past Surgical History, Family History, and Social History were reviewed in [employer] record.   Review of Systems        All symptoms NEG except where BOLDED >>  Constitutional:  F/C/S, fatigue, anorexia, unexpected weight change. HEENT:  HA, visual changes, hearing loss, earache, nasal symptoms, sore throat, mouth sores, hoarseness. Resp:  cough, sputum, hemoptysis; SOB, tightness, wheezing. Cardio:  CP, palpit, DOE, orthopnea, edema. GI:  N/V/D/C, blood in stool; reflux, abd pain, distention, gas. GU:  dysuria, freq, urgency, hematuria, flank pain, voiding difficulty. MS:  joint pain, swelling, tenderness, decr ROM; neck pain, back pain, etc. Neuro:  HA, tremors, seizures, dizziness, syncope, weakness, numbness, gait abn. Skin:  suspicious lesions or skin rash. Heme:  adenopathy, bruising, bleeding. Psyche:  confusion, agitation, sleep disturbance, hallucinations, anxiety, depression suicidal.     Objective:   Physical Exam    Vital Signs:  Reviewed...   General:  WD, WN, 76 y/o WM chr ill appearing; alert & oriented; pleasant & cooperative... HEENT:  Forehead laceration, bilat black eyes; Conjunctiva- red, Sclera- nonicteric, EOM-wnl, PERRLA; EACs-clear, TMs-wnl; NOSE-clear; THROAT- sl red Neck:  Supple w/ decr ROM;  no JVD; normal carotid impulses w/o bruits; no thyromegaly or nodules  palpated; no lymphadenopathy.  Chest:  Decr BS bilat w/ few scat rhonchi at bases, congested cough, no wheezing/ rales/ or consolidation... Heart:  Regular Rhythm; norm S1 & S2 without murmurs, rubs, or gallops detected. Abdomen:  Soft & nontender- no guarding or rebound; normal bowel sounds; no organomegaly or masses palpated. Ext:  Decr ROM; without deformities, +arthritic changes; no varicose veins, venous insuffic, or edema;  Pulses intact w/o bruits. Neuro:  CNs II-XII intact; motor testing normal; sensory testing normal; +gait abn, sl leg weakness, balance is off... Derm:  No lesions noted x bilat ecchymoses periorbitally... Lymph:  No cervical, supraclavicular, axillary, or inguinal adenopathy palpated.    Assessment & Plan:    IMP >>     COPD> on Symbicort160-2spBid, ProventilHFA prn; he never filled the Incruse or Spiriva ($$); rec to add GFN400-2Qid + fluids...    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    Cigarette smoker>  Still smoking 1.5ppd and he has smoked 1-1.5ppd for ~60 yrs; he declines smoking cessation help & is not motivated to quit...    Hx cough syncope> this occurred remotely w/ severe coughing paroxysm while he was on an ACE inhib...    CARDIAC issues> HBP, CAD, HL- followed by DrTilley (no notes in Epic)- on ASA/Plavix75, Diovan160, HCT12.5, Lip80, Tricor145...    MEDICAL ISSUES>        Dysphagia, hx esoph stricture & Barrett's esoph> on Prilosec40; eval by DrKaplan in past w/ hx dilatation in the past, now followed by DrArmbruster...       GU issues> on Myrbetriq50, Cialis5       Ortho- LBP w/ lumbar lam 2016 by DrDumonski who now plans further extensive surg...       Anxiety/depression> prev on WellbutrinXL150; his son is treated for depression, friend died w/ metastatic  lung cancer  PLAN >>   06/07/15>   Treson & I discussed his continued smoking & he is not motivated to quit- offered meds and he will call if he changes his mind;  Rec to continue Symbicort160-2spBid, he was tried on Spiriva respimat in past but wouldn't fill it due to cost- I pointed out that if he'd quit smoking he could get the Spiriva & noted that his breathing would be better due to both things! Rec to add Mucinex 2400mg /d.Marland KitchenMarland KitchenWe plan ROV 56months. 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, still smoking 1.5ppd, using his Symbicort (but I suspect not regularly), refuses addition of a LAMA med that would be additionally beneficial in the long run;  He improved after Zithromax & Pred pack per DrTisovec recently & we will  place him on a sl longer course of oral PRED (see AVS), plus maximize mucolytic therapy w/ GFN400-2Qid + fluids;  His recent ?syncopal spell does not appear to be cough related- perhaps a fall from leg weakness, head trauma w/ concussion & retrograde amnesia for the fall- in any event he did not go to the ER & is in need of concussion eval/ CT vs MRI head (he is on ASA/Plavix)/ etc; pt has an ASAP appt w/ his PCP in the AM... I am concerned regarding his surgical risk. 11/28/16>   [place] is  rec to add a nutritional supplements to his regimen, try Astelin NS for the runny nose, and continue the [employer];  But most importantly he must committ to smoking cessation!! Continue same meds 03/03/17>   Allan is again encouraged to cut back & quit smoking- options for medications, nicotine replacement, counseling, hypnosis, etc discussed but he is not interested;  Continue Symbicort & Ventolin, add GFN as discussed and do the best he can w/ exercise. 08/04/17>   As prev discussed on numerous occais- he needs to quit all smoking but is not motivated & refuses smoking cessation help etc;  Continue Symbicort160-2spBid + AlbutHFA rescue inhaler, he refuses the addition of a LAMA to his regimen;  For the diarrhea, postural hypotension- avoid chenese food 7 MSG, take gatorade/ imodium/ align/ [place] and f/u w/ PCP- DrTisovec...     Medication List        Accurate as of 08/04/17  3:24 PM. Always use your most recent med list.          acetaminophen 500 MG tablet Commonly known as:  TYLENOL   albuterol 108 (90 Base) MCG/ACT inhaler Commonly known as:  PROVENTIL HFA;VENTOLIN HFA   atorvastatin 80 MG tablet Commonly known as:  LIPITOR   clopidogrel 75 MG tablet Commonly known as:  PLAVIX   DULoxetine 60 MG capsule Commonly known as:  CYMBALTA   fenofibrate 145 MG tablet Commonly known as:  TRICOR   folic acid 242 MCG tablet Commonly known as:  FOLVITE   hydrochlorothiazide 12.5 MG capsule Commonly known as:  MICROZIDE   HYDROcodone-acetaminophen 5-325 MG tablet Commonly known as:  NORCO/VICODIN   MYRBETRIQ 50 MG Tb24 tablet Generic drug:  mirabegron ER   NITROSTAT 0.4 MG SL tablet Generic drug:  nitroGLYCERIN   omeprazole 40 MG capsule Commonly known as:  PRILOSEC TAKE ONE CAPSULE BY MOUTH TWICE DAILY   SYMBICORT 160-4.5 MCG/ACT inhaler Generic drug:  budesonide-formoterol INHALE TWO PUFFS INTO LUNGS TWICE DAILY   tadalafil 5 MG tablet Commonly known as:   CIALIS   tiZANidine 4 MG capsule Commonly known as:  ZANAFLEX   valsartan 160 MG tablet Commonly known as:  DIOVAN

## 2017-08-04 NOTE — Patient Instructions (Signed)
Today we updated your med list in our EPIC system...    Continue the SYMBICORT= 2 sprays twice daily    Continue the GUAIFENESIN 400mg  2tabs 3 times daily w/ fluids...    For your dizziness & postural hypotension>>    1) HOLD your BP meds- Valsartan & HCT >> ok to restart slowly when you are feeling better & as your BP dictates.Marland KitchenMarland Kitchen    2) you need some fluids >> Gatorade (sip it all Foti), soups, bullion, etc..Marland Kitchen    3) if not responding you will need some IVFluids via the ER or outpt...  Remember to take the ALIGN or similar probiotic daily  Today we checked some blood work>>    We will contact you w/ the results when available...   It is time for you yearly low-dose screening CT Chest & we will set this up for you...  Marland KitchenRonalee Belts, for goodness sake-- cut back on your smoking & work on smoking cessation...  Call for any questions...  Let's plan a follow up visit in 69mo, sooner if needed for problems.Marland KitchenMarland Kitchen

## 2017-08-07 ENCOUNTER — Ambulatory Visit (INDEPENDENT_AMBULATORY_CARE_PROVIDER_SITE_OTHER)
Admission: RE | Admit: 2017-08-07 | Discharge: 2017-08-07 | Disposition: A | Discharge status: Home / Self Care | Payer: Medicare Other | Source: Ambulatory Visit | Attending: Pulmonary Disease | Admitting: Pulmonary Disease

## 2017-08-07 DIAGNOSIS — Z122 Encounter for screening for malignant neoplasm of respiratory organs: Secondary | ICD-10-CM

## 2017-08-07 DIAGNOSIS — Z87891 Personal history of nicotine dependence: Secondary | ICD-10-CM | POA: Diagnosis not present

## 2017-09-14 ENCOUNTER — Other Ambulatory Visit: Payer: Self-pay

## 2017-09-14 ENCOUNTER — Emergency Department (HOSPITAL_COMMUNITY): Payer: Medicare Other

## 2017-09-14 ENCOUNTER — Inpatient Hospital Stay (HOSPITAL_COMMUNITY)
Admission: EM | Admit: 2017-09-14 | Discharge: 2017-09-18 | DRG: 287 | Disposition: A | Discharge status: Home / Self Care | Payer: Medicare Other | Attending: Cardiology | Admitting: Cardiology

## 2017-09-14 DIAGNOSIS — Z9842 Cataract extraction status, left eye: Secondary | ICD-10-CM

## 2017-09-14 DIAGNOSIS — N401 Enlarged prostate with lower urinary tract symptoms: Secondary | ICD-10-CM | POA: Diagnosis present

## 2017-09-14 DIAGNOSIS — I119 Hypertensive heart disease without heart failure: Secondary | ICD-10-CM | POA: Diagnosis present

## 2017-09-14 DIAGNOSIS — E785 Hyperlipidemia, unspecified: Secondary | ICD-10-CM | POA: Diagnosis present

## 2017-09-14 DIAGNOSIS — I213 ST elevation (STEMI) myocardial infarction of unspecified site: Secondary | ICD-10-CM

## 2017-09-14 DIAGNOSIS — F10239 Alcohol dependence with withdrawal, unspecified: Secondary | ICD-10-CM | POA: Diagnosis present

## 2017-09-14 DIAGNOSIS — Z7951 Long term (current) use of inhaled steroids: Secondary | ICD-10-CM

## 2017-09-14 DIAGNOSIS — F10231 Alcohol dependence with withdrawal delirium: Secondary | ICD-10-CM | POA: Diagnosis present

## 2017-09-14 DIAGNOSIS — R410 Disorientation, unspecified: Secondary | ICD-10-CM | POA: Diagnosis present

## 2017-09-14 DIAGNOSIS — I25119 Atherosclerotic heart disease of native coronary artery with unspecified angina pectoris: Secondary | ICD-10-CM | POA: Diagnosis present

## 2017-09-14 DIAGNOSIS — Z8673 Personal history of transient ischemic attack (TIA), and cerebral infarction without residual deficits: Secondary | ICD-10-CM

## 2017-09-14 DIAGNOSIS — I5181 Takotsubo syndrome: Principal | ICD-10-CM | POA: Diagnosis present

## 2017-09-14 DIAGNOSIS — Z86010 Personal history of colonic polyps: Secondary | ICD-10-CM

## 2017-09-14 DIAGNOSIS — Z823 Family history of stroke: Secondary | ICD-10-CM

## 2017-09-14 DIAGNOSIS — I2510 Atherosclerotic heart disease of native coronary artery without angina pectoris: Secondary | ICD-10-CM | POA: Diagnosis present

## 2017-09-14 DIAGNOSIS — F10939 Alcohol use, unspecified with withdrawal, unspecified: Secondary | ICD-10-CM | POA: Diagnosis present

## 2017-09-14 DIAGNOSIS — K2270 Barrett's esophagus without dysplasia: Secondary | ICD-10-CM | POA: Diagnosis present

## 2017-09-14 DIAGNOSIS — Z7902 Long term (current) use of antithrombotics/antiplatelets: Secondary | ICD-10-CM

## 2017-09-14 DIAGNOSIS — Z79891 Long term (current) use of opiate analgesic: Secondary | ICD-10-CM

## 2017-09-14 DIAGNOSIS — Z888 Allergy status to other drugs, medicaments and biological substances status: Secondary | ICD-10-CM

## 2017-09-14 DIAGNOSIS — M549 Dorsalgia, unspecified: Secondary | ICD-10-CM | POA: Diagnosis present

## 2017-09-14 DIAGNOSIS — Z85828 Personal history of other malignant neoplasm of skin: Secondary | ICD-10-CM

## 2017-09-14 DIAGNOSIS — Z9841 Cataract extraction status, right eye: Secondary | ICD-10-CM

## 2017-09-14 DIAGNOSIS — Z79899 Other long term (current) drug therapy: Secondary | ICD-10-CM

## 2017-09-14 DIAGNOSIS — J439 Emphysema, unspecified: Secondary | ICD-10-CM | POA: Diagnosis present

## 2017-09-14 DIAGNOSIS — R3915 Urgency of urination: Secondary | ICD-10-CM | POA: Diagnosis present

## 2017-09-14 DIAGNOSIS — Z91041 Radiographic dye allergy status: Secondary | ICD-10-CM

## 2017-09-14 DIAGNOSIS — I10 Essential (primary) hypertension: Secondary | ICD-10-CM | POA: Diagnosis present

## 2017-09-14 DIAGNOSIS — R631 Polydipsia: Secondary | ICD-10-CM | POA: Diagnosis present

## 2017-09-14 DIAGNOSIS — F32A Depression, unspecified: Secondary | ICD-10-CM | POA: Diagnosis present

## 2017-09-14 DIAGNOSIS — Z720 Tobacco use: Secondary | ICD-10-CM | POA: Diagnosis present

## 2017-09-14 DIAGNOSIS — E871 Hypo-osmolality and hyponatremia: Secondary | ICD-10-CM | POA: Diagnosis present

## 2017-09-14 DIAGNOSIS — Z716 Tobacco abuse counseling: Secondary | ICD-10-CM

## 2017-09-14 DIAGNOSIS — J449 Chronic obstructive pulmonary disease, unspecified: Secondary | ICD-10-CM | POA: Diagnosis present

## 2017-09-14 DIAGNOSIS — Z8249 Family history of ischemic heart disease and other diseases of the circulatory system: Secondary | ICD-10-CM

## 2017-09-14 DIAGNOSIS — R918 Other nonspecific abnormal finding of lung field: Secondary | ICD-10-CM | POA: Diagnosis present

## 2017-09-14 DIAGNOSIS — G8929 Other chronic pain: Secondary | ICD-10-CM | POA: Diagnosis present

## 2017-09-14 DIAGNOSIS — N179 Acute kidney failure, unspecified: Secondary | ICD-10-CM | POA: Diagnosis not present

## 2017-09-14 DIAGNOSIS — Z87442 Personal history of urinary calculi: Secondary | ICD-10-CM

## 2017-09-14 DIAGNOSIS — G629 Polyneuropathy, unspecified: Secondary | ICD-10-CM | POA: Diagnosis present

## 2017-09-14 DIAGNOSIS — F329 Major depressive disorder, single episode, unspecified: Secondary | ICD-10-CM | POA: Diagnosis present

## 2017-09-14 DIAGNOSIS — F17210 Nicotine dependence, cigarettes, uncomplicated: Secondary | ICD-10-CM | POA: Diagnosis present

## 2017-09-14 DIAGNOSIS — Z8719 Personal history of other diseases of the digestive system: Secondary | ICD-10-CM

## 2017-09-14 DIAGNOSIS — K219 Gastro-esophageal reflux disease without esophagitis: Secondary | ICD-10-CM | POA: Diagnosis present

## 2017-09-14 DIAGNOSIS — Z8261 Family history of arthritis: Secondary | ICD-10-CM

## 2017-09-14 LAB — I-STAT TROPONIN, ED: Troponin i, poc: 1.76 ng/mL (ref 0.00–0.08)

## 2017-09-14 MED ORDER — SODIUM CHLORIDE 0.9 % IV SOLN
INTRAVENOUS | Status: DC
  Administered 2017-09-15: 20 mL via INTRAVENOUS

## 2017-09-14 MED ORDER — HYDROCORTISONE NA SUCCINATE PF 250 MG IJ SOLR
200.0000 mg | Freq: Once | INTRAMUSCULAR | Status: AC
  Administered 2017-09-15: 200 mg via INTRAVENOUS
  Filled 2017-09-14: qty 200

## 2017-09-14 MED ORDER — DIPHENHYDRAMINE HCL 50 MG/ML IJ SOLN
50.0000 mg | Freq: Once | INTRAMUSCULAR | Status: DC
  Filled 2017-09-14: qty 1

## 2017-09-14 MED ORDER — DIPHENHYDRAMINE HCL 25 MG PO CAPS
50.0000 mg | ORAL_CAPSULE | Freq: Once | ORAL | Status: DC
  Filled 2017-09-14: qty 2

## 2017-09-14 MED ORDER — HEPARIN SODIUM (PORCINE) 5000 UNIT/ML IJ SOLN
60.0000 [IU]/kg | Freq: Once | INTRAMUSCULAR | Status: AC
  Administered 2017-09-15: 3650 [IU] via INTRAVENOUS
  Filled 2017-09-14: qty 1

## 2017-09-14 MED ORDER — ASPIRIN 81 MG PO CHEW
324.0000 mg | CHEWABLE_TABLET | Freq: Once | ORAL | Status: AC
  Administered 2017-09-15: 324 mg via ORAL
  Filled 2017-09-14: qty 4

## 2017-09-14 NOTE — ED Triage Notes (Signed)
Pt states he has been weak all over that got worse today. Pt also c/o chest pain that is central/left sided that goes into his back. Pt states he has had pericarditis in the past and it feels similar. Pt denies anything that makes the discomfort better or worse. Looks pale during triage

## 2017-09-15 ENCOUNTER — Encounter (HOSPITAL_COMMUNITY)
Admission: EM | Disposition: A | Discharge status: Home / Self Care | Payer: Self-pay | Source: Home / Self Care | Attending: Cardiology

## 2017-09-15 ENCOUNTER — Encounter (HOSPITAL_COMMUNITY): Payer: Self-pay | Admitting: *Deleted

## 2017-09-15 ENCOUNTER — Other Ambulatory Visit: Payer: Self-pay

## 2017-09-15 ENCOUNTER — Inpatient Hospital Stay (HOSPITAL_COMMUNITY): Payer: Medicare Other

## 2017-09-15 DIAGNOSIS — I5181 Takotsubo syndrome: Secondary | ICD-10-CM

## 2017-09-15 DIAGNOSIS — Z720 Tobacco use: Secondary | ICD-10-CM | POA: Diagnosis not present

## 2017-09-15 DIAGNOSIS — I2510 Atherosclerotic heart disease of native coronary artery without angina pectoris: Secondary | ICD-10-CM

## 2017-09-15 DIAGNOSIS — K219 Gastro-esophageal reflux disease without esophagitis: Secondary | ICD-10-CM | POA: Diagnosis present

## 2017-09-15 DIAGNOSIS — J430 Unilateral pulmonary emphysema [MacLeod's syndrome]: Secondary | ICD-10-CM | POA: Diagnosis not present

## 2017-09-15 DIAGNOSIS — E785 Hyperlipidemia, unspecified: Secondary | ICD-10-CM | POA: Diagnosis present

## 2017-09-15 DIAGNOSIS — J439 Emphysema, unspecified: Secondary | ICD-10-CM | POA: Diagnosis present

## 2017-09-15 DIAGNOSIS — J449 Chronic obstructive pulmonary disease, unspecified: Secondary | ICD-10-CM | POA: Diagnosis not present

## 2017-09-15 DIAGNOSIS — N179 Acute kidney failure, unspecified: Secondary | ICD-10-CM | POA: Diagnosis not present

## 2017-09-15 DIAGNOSIS — Z91041 Radiographic dye allergy status: Secondary | ICD-10-CM | POA: Diagnosis not present

## 2017-09-15 DIAGNOSIS — Z8261 Family history of arthritis: Secondary | ICD-10-CM | POA: Diagnosis not present

## 2017-09-15 DIAGNOSIS — Z86010 Personal history of colonic polyps: Secondary | ICD-10-CM | POA: Diagnosis not present

## 2017-09-15 DIAGNOSIS — I10 Essential (primary) hypertension: Secondary | ICD-10-CM | POA: Diagnosis not present

## 2017-09-15 DIAGNOSIS — Z85828 Personal history of other malignant neoplasm of skin: Secondary | ICD-10-CM | POA: Diagnosis not present

## 2017-09-15 DIAGNOSIS — F17210 Nicotine dependence, cigarettes, uncomplicated: Secondary | ICD-10-CM | POA: Diagnosis present

## 2017-09-15 DIAGNOSIS — Z8719 Personal history of other diseases of the digestive system: Secondary | ICD-10-CM | POA: Diagnosis not present

## 2017-09-15 DIAGNOSIS — Z716 Tobacco abuse counseling: Secondary | ICD-10-CM | POA: Diagnosis not present

## 2017-09-15 DIAGNOSIS — Z87442 Personal history of urinary calculi: Secondary | ICD-10-CM | POA: Diagnosis not present

## 2017-09-15 DIAGNOSIS — R410 Disorientation, unspecified: Secondary | ICD-10-CM | POA: Diagnosis not present

## 2017-09-15 DIAGNOSIS — Z823 Family history of stroke: Secondary | ICD-10-CM | POA: Diagnosis not present

## 2017-09-15 DIAGNOSIS — I428 Other cardiomyopathies: Secondary | ICD-10-CM | POA: Diagnosis not present

## 2017-09-15 DIAGNOSIS — E871 Hypo-osmolality and hyponatremia: Secondary | ICD-10-CM | POA: Diagnosis present

## 2017-09-15 DIAGNOSIS — F329 Major depressive disorder, single episode, unspecified: Secondary | ICD-10-CM | POA: Diagnosis present

## 2017-09-15 DIAGNOSIS — G8929 Other chronic pain: Secondary | ICD-10-CM | POA: Diagnosis present

## 2017-09-15 DIAGNOSIS — Z8673 Personal history of transient ischemic attack (TIA), and cerebral infarction without residual deficits: Secondary | ICD-10-CM | POA: Diagnosis not present

## 2017-09-15 DIAGNOSIS — Z8249 Family history of ischemic heart disease and other diseases of the circulatory system: Secondary | ICD-10-CM | POA: Diagnosis not present

## 2017-09-15 DIAGNOSIS — Z888 Allergy status to other drugs, medicaments and biological substances status: Secondary | ICD-10-CM | POA: Diagnosis not present

## 2017-09-15 DIAGNOSIS — I213 ST elevation (STEMI) myocardial infarction of unspecified site: Secondary | ICD-10-CM | POA: Diagnosis present

## 2017-09-15 DIAGNOSIS — K2270 Barrett's esophagus without dysplasia: Secondary | ICD-10-CM | POA: Diagnosis present

## 2017-09-15 DIAGNOSIS — R631 Polydipsia: Secondary | ICD-10-CM | POA: Diagnosis present

## 2017-09-15 DIAGNOSIS — F10231 Alcohol dependence with withdrawal delirium: Secondary | ICD-10-CM | POA: Diagnosis present

## 2017-09-15 HISTORY — PX: LEFT HEART CATH AND CORONARY ANGIOGRAPHY: CATH118249

## 2017-09-15 LAB — CBC
HEMATOCRIT: 32.7 % — AB (ref 39.0–52.0)
HEMOGLOBIN: 11.8 g/dL — AB (ref 13.0–17.0)
MCH: 34.8 pg — AB (ref 26.0–34.0)
MCHC: 36.1 g/dL — AB (ref 30.0–36.0)
MCV: 96.5 fL (ref 78.0–100.0)
Platelets: 300 10*3/uL (ref 150–400)
RBC: 3.39 MIL/uL — ABNORMAL LOW (ref 4.22–5.81)
RDW: 13.8 % (ref 11.5–15.5)
WBC: 6 10*3/uL (ref 4.0–10.5)

## 2017-09-15 LAB — LIPID PANEL
CHOL/HDL RATIO: 1.6 ratio
Cholesterol: 131 mg/dL (ref 0–200)
HDL: 84 mg/dL (ref >40)
LDL CALC: 38 mg/dL (ref 0–99)
Triglycerides: 43 mg/dL (ref <150)
VLDL: 9 mg/dL (ref 0–40)

## 2017-09-15 LAB — ECHOCARDIOGRAM COMPLETE
Height: 65 in
WEIGHTICAEL: 2253.98 [oz_av]

## 2017-09-15 LAB — BASIC METABOLIC PANEL
ANION GAP: 12 (ref 5–15)
ANION GAP: 17 — AB (ref 5–15)
Anion gap: 15 (ref 5–15)
BUN: 15 mg/dL (ref 6–20)
BUN: 16 mg/dL (ref 6–20)
BUN: 19 mg/dL (ref 6–20)
CALCIUM: 8.2 mg/dL — AB (ref 8.9–10.3)
CHLORIDE: 84 mmol/L — AB (ref 101–111)
CO2: 20 mmol/L — AB (ref 22–32)
CO2: 22 mmol/L (ref 22–32)
CO2: 22 mmol/L (ref 22–32)
Calcium: 8.5 mg/dL — ABNORMAL LOW (ref 8.9–10.3)
Calcium: 8.8 mg/dL — ABNORMAL LOW (ref 8.9–10.3)
Chloride: 85 mmol/L — ABNORMAL LOW (ref 101–111)
Chloride: 90 mmol/L — ABNORMAL LOW (ref 101–111)
Creatinine, Ser: 1.22 mg/dL (ref 0.61–1.24)
Creatinine, Ser: 1.29 mg/dL — ABNORMAL HIGH (ref 0.61–1.24)
Creatinine, Ser: 1.51 mg/dL — ABNORMAL HIGH (ref 0.61–1.24)
GFR calc Af Amer: >60 mL/min (ref >60)
GFR calc Af Amer: >60 mL/min (ref >60)
GFR calc non Af Amer: 53 mL/min — ABNORMAL LOW (ref >60)
GFR calc non Af Amer: 57 mL/min — ABNORMAL LOW (ref >60)
GFR, EST AFRICAN AMERICAN: 51 mL/min — AB (ref >60)
GFR, EST NON AFRICAN AMERICAN: 44 mL/min — AB (ref >60)
GLUCOSE: 128 mg/dL — AB (ref 65–99)
GLUCOSE: 131 mg/dL — AB (ref 65–99)
Glucose, Bld: 101 mg/dL — ABNORMAL HIGH (ref 65–99)
POTASSIUM: 3.3 mmol/L — AB (ref 3.5–5.1)
POTASSIUM: 3.3 mmol/L — AB (ref 3.5–5.1)
POTASSIUM: 3.3 mmol/L — AB (ref 3.5–5.1)
Sodium: 121 mmol/L — ABNORMAL LOW (ref 135–145)
Sodium: 122 mmol/L — ABNORMAL LOW (ref 135–145)
Sodium: 124 mmol/L — ABNORMAL LOW (ref 135–145)

## 2017-09-15 LAB — I-STAT CHEM 8, ED
BUN: 17 mg/dL (ref 6–20)
CREATININE: 1.3 mg/dL — AB (ref 0.61–1.24)
Calcium, Ion: 1.02 mmol/L — ABNORMAL LOW (ref 1.15–1.40)
Chloride: 85 mmol/L — ABNORMAL LOW (ref 101–111)
Glucose, Bld: 131 mg/dL — ABNORMAL HIGH (ref 65–99)
HCT: 40 % (ref 39.0–52.0)
Hemoglobin: 13.6 g/dL (ref 13.0–17.0)
Potassium: 3.4 mmol/L — ABNORMAL LOW (ref 3.5–5.1)
Sodium: 123 mmol/L — ABNORMAL LOW (ref 135–145)
TCO2: 24 mmol/L (ref 22–32)

## 2017-09-15 LAB — TYPE AND SCREEN
ABO/RH(D): O POS
Antibody Screen: NEGATIVE

## 2017-09-15 LAB — TROPONIN I
TROPONIN I: 3.46 ng/mL — AB (ref <0.03)
Troponin I: 1.35 ng/mL (ref <0.03)

## 2017-09-15 LAB — GLUCOSE, RANDOM: GLUCOSE: 129 mg/dL — AB (ref 65–99)

## 2017-09-15 LAB — I-STAT CG4 LACTIC ACID, ED: Lactic Acid, Venous: 1.77 mmol/L (ref 0.5–1.9)

## 2017-09-15 LAB — POCT ACTIVATED CLOTTING TIME: Activated Clotting Time: 175 seconds

## 2017-09-15 SURGERY — LEFT HEART CATH AND CORONARY ANGIOGRAPHY
Anesthesia: LOCAL

## 2017-09-15 MED ORDER — FENOFIBRATE 160 MG PO TABS
160.0000 mg | ORAL_TABLET | Freq: Every day | ORAL | Status: DC
  Administered 2017-09-15 – 2017-09-18 (×4): 160 mg via ORAL
  Filled 2017-09-15 (×4): qty 1

## 2017-09-15 MED ORDER — HEPARIN SODIUM (PORCINE) 5000 UNIT/ML IJ SOLN
5000.0000 [IU] | Freq: Three times a day (TID) | INTRAMUSCULAR | Status: DC
  Administered 2017-09-15 – 2017-09-18 (×7): 5000 [IU] via SUBCUTANEOUS
  Filled 2017-09-15 (×7): qty 1

## 2017-09-15 MED ORDER — NITROGLYCERIN 0.4 MG SL SUBL
0.4000 mg | SUBLINGUAL_TABLET | SUBLINGUAL | Status: DC | PRN
  Filled 2017-09-15: qty 1

## 2017-09-15 MED ORDER — METOPROLOL SUCCINATE ER 50 MG PO TB24
50.0000 mg | ORAL_TABLET | Freq: Every day | ORAL | Status: DC
  Administered 2017-09-15 – 2017-09-18 (×4): 50 mg via ORAL
  Filled 2017-09-15 (×4): qty 1

## 2017-09-15 MED ORDER — SODIUM CHLORIDE 0.9 % IV SOLN: 250.0000 mL | INTRAVENOUS | Status: DC | PRN

## 2017-09-15 MED ORDER — HYDROCORTISONE NA SUCCINATE PF 250 MG IJ SOLR
200.0000 mg | Freq: Once | INTRAMUSCULAR | Status: DC
Start: 2017-09-15 — End: 2017-09-15

## 2017-09-15 MED ORDER — POTASSIUM CHLORIDE CRYS ER 20 MEQ PO TBCR
40.0000 meq | EXTENDED_RELEASE_TABLET | Freq: Once | ORAL | Status: AC
  Administered 2017-09-15: 40 meq via ORAL
  Filled 2017-09-15: qty 2

## 2017-09-15 MED ORDER — PANTOPRAZOLE SODIUM 40 MG PO TBEC
40.0000 mg | DELAYED_RELEASE_TABLET | Freq: Every day | ORAL | Status: DC
  Administered 2017-09-15 – 2017-09-18 (×4): 40 mg via ORAL
  Filled 2017-09-15 (×4): qty 1

## 2017-09-15 MED ORDER — HYDROCHLOROTHIAZIDE 12.5 MG PO CAPS: 12.5000 mg | ORAL_CAPSULE | Freq: Every day | ORAL | Status: DC

## 2017-09-15 MED ORDER — HEPARIN (PORCINE) IN NACL 2-0.9 UNIT/ML-% IJ SOLN
INTRAMUSCULAR | Status: AC
  Filled 2017-09-15: qty 1000

## 2017-09-15 MED ORDER — IOPAMIDOL (ISOVUE-370) INJECTION 76%
INTRAVENOUS | Status: DC | PRN
  Administered 2017-09-15: 100 mL via INTRAVENOUS

## 2017-09-15 MED ORDER — SODIUM CHLORIDE 0.9 % WEIGHT BASED INFUSION: 1.0000 mL/kg/h | INTRAVENOUS | Status: DC

## 2017-09-15 MED ORDER — DULOXETINE HCL 60 MG PO CPEP
60.0000 mg | ORAL_CAPSULE | Freq: Every day | ORAL | Status: DC
  Administered 2017-09-15 – 2017-09-18 (×4): 60 mg via ORAL
  Filled 2017-09-15 (×4): qty 1

## 2017-09-15 MED ORDER — NITROGLYCERIN 0.4 MG SL SUBL: 0.4000 mg | SUBLINGUAL_TABLET | SUBLINGUAL | Status: DC | PRN

## 2017-09-15 MED ORDER — VERAPAMIL HCL 2.5 MG/ML IV SOLN
INTRAVENOUS | Status: DC | PRN
  Administered 2017-09-15: 10 mL via INTRA_ARTERIAL

## 2017-09-15 MED ORDER — SODIUM CHLORIDE 0.9 % IV SOLN
0.2000 ug/kg/h | INTRAVENOUS | Status: DC
  Filled 2017-09-15: qty 2

## 2017-09-15 MED ORDER — VERAPAMIL HCL 2.5 MG/ML IV SOLN
INTRAVENOUS | Status: AC
  Filled 2017-09-15: qty 2

## 2017-09-15 MED ORDER — LIDOCAINE HCL (PF) 1 % IJ SOLN
INTRAMUSCULAR | Status: AC
  Filled 2017-09-15: qty 30

## 2017-09-15 MED ORDER — METOPROLOL TARTRATE 12.5 MG HALF TABLET
12.5000 mg | ORAL_TABLET | Freq: Two times a day (BID) | ORAL | Status: DC
Start: 2017-09-15 — End: 2017-09-15
  Administered 2017-09-15: 12.5 mg via ORAL
  Filled 2017-09-15 (×2): qty 1

## 2017-09-15 MED ORDER — HYDROCODONE-ACETAMINOPHEN 5-325 MG PO TABS
1.0000 | ORAL_TABLET | Freq: Two times a day (BID) | ORAL | Status: DC
  Administered 2017-09-15 – 2017-09-18 (×7): 1 via ORAL
  Filled 2017-09-15 (×7): qty 1

## 2017-09-15 MED ORDER — LIDOCAINE HCL (PF) 1 % IJ SOLN
INTRAMUSCULAR | Status: DC | PRN
  Administered 2017-09-15: 15 mL
  Administered 2017-09-15: 3 mL

## 2017-09-15 MED ORDER — FOLIC ACID 0.5 MG HALF TAB
500.0000 ug | ORAL_TABLET | Freq: Every day | ORAL | Status: DC
  Administered 2017-09-15: 0.5 mg via ORAL
  Filled 2017-09-15 (×2): qty 1

## 2017-09-15 MED ORDER — SODIUM CHLORIDE 0.9 % IV SOLN
INTRAVENOUS | Status: AC
  Administered 2017-09-15: 75 mL/h via INTRAVENOUS

## 2017-09-15 MED ORDER — SODIUM CHLORIDE 0.9% FLUSH
3.0000 mL | Freq: Two times a day (BID) | INTRAVENOUS | Status: DC
  Administered 2017-09-15 – 2017-09-18 (×6): 3 mL via INTRAVENOUS

## 2017-09-15 MED ORDER — NITROGLYCERIN 1 MG/10 ML FOR IR/CATH LAB
INTRA_ARTERIAL | Status: AC
  Filled 2017-09-15: qty 10

## 2017-09-15 MED ORDER — ONDANSETRON HCL 4 MG/2ML IJ SOLN: 4.0000 mg | Freq: Four times a day (QID) | INTRAMUSCULAR | Status: DC | PRN

## 2017-09-15 MED ORDER — MIRABEGRON ER 25 MG PO TB24
50.0000 mg | ORAL_TABLET | Freq: Every day | ORAL | Status: DC
  Administered 2017-09-16 – 2017-09-18 (×3): 50 mg via ORAL
  Filled 2017-09-15: qty 1
  Filled 2017-09-15 (×3): qty 2
  Filled 2017-09-15: qty 1

## 2017-09-15 MED ORDER — TIZANIDINE HCL 4 MG PO TABS
4.0000 mg | ORAL_TABLET | Freq: Four times a day (QID) | ORAL | Status: DC
  Filled 2017-09-15 (×4): qty 1

## 2017-09-15 MED ORDER — ACETAMINOPHEN 325 MG PO TABS
650.0000 mg | ORAL_TABLET | ORAL | Status: DC | PRN
  Administered 2017-09-17 – 2017-09-18 (×2): 650 mg via ORAL
  Filled 2017-09-15 (×2): qty 2

## 2017-09-15 MED ORDER — MOMETASONE FURO-FORMOTEROL FUM 200-5 MCG/ACT IN AERO
2.0000 | INHALATION_SPRAY | Freq: Two times a day (BID) | RESPIRATORY_TRACT | Status: DC
  Administered 2017-09-15 – 2017-09-18 (×6): 2 via RESPIRATORY_TRACT
  Filled 2017-09-15: qty 8.8

## 2017-09-15 MED ORDER — SODIUM CHLORIDE 0.9% FLUSH: 3.0000 mL | INTRAVENOUS | Status: DC | PRN

## 2017-09-15 MED ORDER — CLOPIDOGREL BISULFATE 75 MG PO TABS
75.0000 mg | ORAL_TABLET | Freq: Every day | ORAL | Status: DC
  Administered 2017-09-15 – 2017-09-18 (×4): 75 mg via ORAL
  Filled 2017-09-15 (×4): qty 1

## 2017-09-15 MED ORDER — LORAZEPAM 2 MG/ML IJ SOLN
1.0000 mg | Freq: Four times a day (QID) | INTRAMUSCULAR | Status: DC | PRN
  Administered 2017-09-15: 1 mg via INTRAVENOUS
  Filled 2017-09-15: qty 1

## 2017-09-15 MED ORDER — IOPAMIDOL (ISOVUE-370) INJECTION 76%
INTRAVENOUS | Status: AC
  Filled 2017-09-15: qty 100

## 2017-09-15 MED ORDER — IRBESARTAN 150 MG PO TABS
150.0000 mg | ORAL_TABLET | Freq: Every day | ORAL | Status: DC
  Administered 2017-09-15: 150 mg via ORAL
  Filled 2017-09-15: qty 1

## 2017-09-15 MED ORDER — ATORVASTATIN CALCIUM 80 MG PO TABS
80.0000 mg | ORAL_TABLET | Freq: Every day | ORAL | Status: DC
  Administered 2017-09-15 – 2017-09-17 (×3): 80 mg via ORAL
  Filled 2017-09-15 (×3): qty 1

## 2017-09-15 SURGICAL SUPPLY — 14 items
CATH INFINITI 5FR ANG PIGTAIL (CATHETERS) ×2 IMPLANT
CATH INFINITI JR4 5F (CATHETERS) ×2 IMPLANT
CATH VISTA GUIDE 6FR XBLAD3.5 (CATHETERS) ×2 IMPLANT
DEVICE RAD COMP TR BAND LRG (VASCULAR PRODUCTS) ×2 IMPLANT
GLIDESHEATH SLEND SS 6F .021 (SHEATH) ×2 IMPLANT
GUIDEWIRE INQWIRE 1.5J.035X260 (WIRE) ×1 IMPLANT
INQWIRE 1.5J .035X260CM (WIRE) ×2
KIT ENCORE 26 ADVANTAGE (KITS) ×4 IMPLANT
PACK CARDIAC CATHETERIZATION (CUSTOM PROCEDURE TRAY) ×2 IMPLANT
SHEATH PINNACLE 6F 10CM (SHEATH) ×2 IMPLANT
SYR MEDRAD MARK V 150ML (SYRINGE) ×2 IMPLANT
TRANSDUCER W/STOPCOCK (MISCELLANEOUS) ×2 IMPLANT
TUBING CIL FLEX 10 FLL-RA (TUBING) ×2 IMPLANT
WIRE HI TORQ VERSACORE-J 145CM (WIRE) ×2 IMPLANT

## 2017-09-15 NOTE — Progress Notes (Signed)
Pt is alert and oriented but has some confusion. Pt has inappropriate questions. Pt states that he has generalized pain. Pt was given Ativan. Pt is very anxious and restless. Medications given. Will continue to monitor.

## 2017-09-15 NOTE — Progress Notes (Signed)
Chaplain responded to the Code Stemi of patient who was alert and responding to the medical staff regarding his complaint of chest pain. He was evaluated by Cardiology physician regarding his prior medical history of of heart related symptoms, and was taken to the Cath Lab for further evaluation. The patients neighbor was present with him, and stated he was trying to contact the son of the patient who was working. Chaplain will follow up with the patient's progress at a later time, and ensure contact was made with his son.  Yaakov Guthrie, [place]

## 2017-09-15 NOTE — Evaluation (Signed)
Physical Therapy Evaluation Patient Details Name: Brad Turner MRN: [id number] DOB: Apr 10, 1941 Today's Date: 09/15/2017   History of Present Illness  Pt is a 77 y/o male with a PMH significant of CAD s/p multiple POBA, HTN, HLD, COPD, pericarditis. Pt presents with chest pain and was found to have troponin elevated to 1.76 and ECG revealed NSTEMI. He is now s/p cardiac catheterization.   Clinical Impression  Pt admitted with above diagnosis. Pt currently with functional limitations due to the deficits listed below (see PT Problem List). At the time of PT eval pt was able to perform transfers and ambulation with gross min guard to min assist for balance support and safety. Pt reports that he lives alone with minimal assist able to be provided by family/friends. Discussed recommendation of rehab at the SNF level prior to return home, and pt did not seem to fully understand the type of rehab therapist was attempting to explain. When confusion clears pt may be able to discuss rehab options a little more clearly. Pt will benefit from skilled PT to increase their independence and safety with mobility to allow discharge to the venue listed below.      Follow Up Recommendations SNF;Supervision/Assistance - 24 hour    Equipment Recommendations  Rolling walker with 5" wheels;3in1 (PT)    Recommendations for Other Services       Precautions / Restrictions Precautions Precautions: Fall Restrictions Weight Bearing Restrictions: No      Mobility  Bed Mobility Overal bed mobility: Needs Assistance Bed Mobility: Supine to Sit;Sit to Supine     Supine to sit: Min guard Sit to supine: Min guard   General bed mobility comments: Increased time and close guard for assist to/from EOB.   Transfers Overall transfer level: Needs assistance Equipment used: Rolling walker (2 wheeled) Transfers: Sit to/from Stand Sit to Stand: Min guard         General transfer comment: Pt required momentum to  power-up to full stand. Hands-on support provided throughout session.   Ambulation/Gait Ambulation/Gait assistance: Min assist Ambulation Distance (Feet): 75 Feet Assistive device: Rolling walker (2 wheeled) Gait Pattern/deviations: Step-through pattern;Decreased stride length;Trunk flexed Gait velocity: Decreased Gait velocity interpretation: Below normal speed for age/gender General Gait Details: Occasional assist required for walker management. Frequent VC's for improved posture and closer walker proximity.   Stairs            Wheelchair Mobility    Modified Rankin (Stroke Patients Only)       Balance Overall balance assessment: Needs assistance Sitting-balance support: Feet supported;No upper extremity supported Sitting balance-Leahy Scale: Fair     Standing balance support: No upper extremity supported;During functional activity Standing balance-Leahy Scale: Poor Standing balance comment: Reliant on UE support for balance.                              Pertinent Vitals/Pain Pain Assessment: Faces Faces Pain Scale: Hurts little more Pain Location: back Pain Descriptors / Indicators: Aching Pain Intervention(s): Limited activity within patient's tolerance;Monitored during session;Repositioned    Home Living Family/patient expects to be discharged to:: Private residence Living Arrangements: Alone Available Help at Discharge: Family;Available PRN/intermittently Type of Home: House Home Access: Stairs to enter Entrance Stairs-Rails: None Entrance Stairs-Number of Steps: 2 Home Layout: Two level;Bed/bath upstairs;1/2 bath on main level Home Equipment: Shower seat      Prior Function Level of Independence: Independent  Hand Dominance   Dominant Hand: Right    Extremity/Trunk Assessment   Upper Extremity Assessment Upper Extremity Assessment: Defer to OT evaluation    Lower Extremity Assessment Lower Extremity Assessment:  Generalized weakness    Cervical / Trunk Assessment Cervical / Trunk Assessment: Other exceptions Cervical / Trunk Exceptions: Forward head and rounded shoulder posture noted  Communication   Communication: No difficulties  Cognition Arousal/Alertness: Awake/alert Behavior During Therapy: WFL for tasks assessed/performed Overall Cognitive Status: No family/caregiver present to determine baseline cognitive functioning                                 General Comments: Slightly confused - answers orientation questions with jokes possibly to try and mask confusion. States we are in a "brewery that does not brew enough beer".      General Comments      Exercises     Assessment/Plan    PT Assessment Patient needs continued PT services  PT Problem List Decreased strength;Decreased range of motion;Decreased activity tolerance;Decreased balance;Decreased mobility;Decreased safety awareness;Decreased knowledge of use of DME;Decreased knowledge of precautions;Cardiopulmonary status limiting activity;Pain       PT Treatment Interventions DME instruction;Gait training;Stair training;Functional mobility training;Therapeutic activities;Therapeutic exercise;Neuromuscular re-education;Patient/family education    PT Goals (Current goals can be found in the Care Plan section)  Acute Rehab PT Goals Patient Stated Goal: Return home PT Goal Formulation: With patient Time For Goal Achievement: 09/22/17 Potential to Achieve Goals: Good    Frequency Min 3X/week   Barriers to discharge        Co-evaluation               AM-PAC PT "6 Clicks" Daily Activity  Outcome Measure Difficulty turning over in bed (including adjusting bedclothes, sheets and blankets)?: A Little Difficulty moving from lying on back to sitting on the side of the bed? : A Lot Difficulty sitting down on and standing up from a chair with arms (e.g., wheelchair, bedside commode, etc,.)?: A Lot Help needed  moving to and from a bed to chair (including a wheelchair)?: A Little Help needed walking in hospital room?: A Little Help needed climbing 3-5 steps with a railing? : A Little 6 Click Score: 16    End of Session Equipment Utilized During Treatment: Gait belt Activity Tolerance: Patient limited by fatigue;Patient limited by pain Patient left: in bed;with call bell/phone within reach;with bed alarm set Nurse Communication: Mobility status PT Visit Diagnosis: Unsteadiness on feet (R26.81);Difficulty in walking, not elsewhere classified (R26.2)    Time: 4010-2725 PT Time Calculation (min) (ACUTE ONLY): 23 min   Charges:   PT Evaluation $PT Eval Moderate Complexity: 1 Mod PT Treatments $Gait Training: 8-22 mins   PT G Codes:        Rolinda Roan, PT, DPT Acute Rehabilitation Services Pager: [phone]   Thelma Comp 09/15/2017, 2:30 PM

## 2017-09-15 NOTE — Progress Notes (Signed)
Progress Note  Patient Name: Brad Turner Date of Encounter: 09/15/2017  Primary Cardiologist: No primary care provider on file.   Subjective   At one point asked if they were cleaning the window?? No CP. Been feeling bad for a few days. Smokes. Increased fluids because of dizziness.   Inpatient Medications    Scheduled Meds: . atorvastatin  80 mg Oral QHS  . clopidogrel  75 mg Oral Daily  . DULoxetine  60 mg Oral Daily  . fenofibrate  160 mg Oral Daily  . folic acid  976 mcg Oral Daily  . heparin  5,000 Units Subcutaneous Q8H  . HYDROcodone-acetaminophen  1 tablet Oral BID  . irbesartan  150 mg Oral Daily  . metoprolol tartrate  12.5 mg Oral BID  . mirabegron ER  50 mg Oral Daily  . mometasone-formoterol  2 puff Inhalation BID  . pantoprazole  40 mg Oral Daily  . sodium chloride flush  3 mL Intravenous Q12H  . tiZANidine  4 mg Oral QID   Continuous Infusions: . sodium chloride 20 mL (09/15/17 0002)  . sodium chloride    . sodium chloride 75 mL/hr (09/15/17 0157)   PRN Meds: sodium chloride, acetaminophen, nitroGLYCERIN, ondansetron (ZOFRAN) IV, sodium chloride flush   Vital Signs    Vitals:   09/15/17 0402 09/15/17 0417 09/15/17 0432 09/15/17 0514  BP: (!) 127/92 123/90 (!) 130/91 (!) 119/108  Pulse: 73 73 70 79  Resp: 15 16 19 15   Temp:    97.9 F (36.6 C)  TempSrc:    Oral  SpO2: 98% 99% 97% 100%  Weight:    140 lb 14 oz (63.9 kg)  Height:        Intake/Output Summary (Last 24 hours) at 09/15/2017 0855 Last data filed at 09/15/2017 0647 Gross per 24 hour  Intake 497.5 ml  Output 50 ml  Net 447.5 ml   Filed Weights   09/14/17 2325 09/15/17 0514  Weight: 135 lb (61.2 kg) 140 lb 14 oz (63.9 kg)    Telemetry    NSR, 4 beats of AIVR - Personally Reviewed  ECG    09/14/17-sinus tach, 103, nonspecific ST-T wave changes, questionable PR depression.- Personally Reviewed  Physical Exam   GEN: No acute distress.   Neck: No JVD Cardiac: RRR, no  murmurs, rubs, or gallops.  Respiratory: Clear to auscultation bilaterally. GI: Soft, nontender, non-distended  MS: No edema; No deformity.  Cath site normal. Neuro:  Nonfocal  Psych: Normal affect, he did appear slightly tired.  He did ask me once if they were washing the window.  Labs    Chemistry Recent Labs  Lab 09/14/17 2344 09/15/17 0015 09/15/17 0223  NA 121* 123*  --   K 3.3* 3.4*  --   CL 84* 85*  --   CO2 20*  --   --   GLUCOSE 131* 131* 129*  BUN 16 17  --   CREATININE 1.29* 1.30*  --   CALCIUM 8.8*  --   --   GFRNONAA 53*  --   --   GFRAA >60  --   --   ANIONGAP 17*  --   --      Hematology Recent Labs  Lab 09/14/17 2344 09/15/17 0015  WBC 6.0  --   RBC 3.39*  --   HGB 11.8* 13.6  HCT 32.7* 40.0  MCV 96.5  --   MCH 34.8*  --   MCHC 36.1*  --   RDW  13.8  --   PLT 300  --     Cardiac Enzymes Recent Labs  Lab 09/14/17 2344 09/15/17 0218  TROPONINI 1.35* 3.46*    Recent Labs  Lab 09/14/17 2341  TROPIPOC 1.76*     BNPNo results for input(s): BNP, PROBNP in the last 168 hours.   DDimer No results for input(s): DDIMER in the last 168 hours.   Radiology    Dg Chest Portable 1 View  Result Date: 09/15/2017 CLINICAL DATA:  Weakness, LEFT chest pain. History of pericarditis with similar symptoms. EXAM: PORTABLE CHEST 1 VIEW COMPARISON:  CT chest August 07, 2017. FINDINGS: Cardiac silhouette is upper limits of normal in size, mediastinal silhouette is nonsuspicious. Mildly calcified aortic knob. Mild hyperinflation. No pleural effusion or focal consolidation. No pneumothorax. Surgical clips LEFT neck compatible with thyroidectomy. Soft tissue planes and included osseous structures are nonsuspicious. Multiple EKG lines overlie the patient and may obscure subtle underlying pathology. IMPRESSION: Borderline cardiomegaly.  No acute pulmonary process. Aortic Atherosclerosis (ICD10-I70.0). Electronically Signed   By: Elon Alas M.D.   On: 09/15/2017  00:22    Cardiac Studies   Cath 09/15/17  Prox LAD lesion is 25% stenosed.  Ost 1st Mrg to 1st Mrg lesion is 60% stenosed.  Ost 2nd Mrg to 2nd Mrg lesion is 65% stenosed.  There is moderate left ventricular systolic dysfunction.  LV end diastolic pressure is mildly elevated.  The left ventricular ejection fraction is 35-45% by visual estimate.  Dist LAD lesion is 60% stenosed.   1. Modest nonobstructive CAD involving the distal LAD, OM1, and OM2. 2. Moderate LV dysfunction with typical pattern of Takotsubo cardiomyopathy 3. Mildly elevated LVEDP- 25 mm Hg  Plan: medical management. Would avoid using right radial access in the future.    Patient Profile     77 y.o. male admitted with non-ST elevation myocardial infarction with urgent cardiac catheterization revealing moderate nonobstructive CAD with ejection fraction in the 35-45% range with a typical pattern of stress-induced cardiomyopathy with hypertension hyperlipidemia COPD active smoker and prior pericarditis.  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  No PCI performed.   Continue with aspirin and aggressive secondary prevention.  On dual antiplatelet therapy. -I will change his metoprolol to Toprol-XL 50 mg, currently on metoprolol tartrate 12.5 mg twice a Fun[place]. - Currently on irbesartan 150.  We will continue. -LVEDP 25 mmHg.  Hyponatremia sodium 121, 123 on admission. -Possibly from increased water intake over the past few days.  Continue with free water restriction.  We will stop his HCTZ.  Received IV normal saline 75 an hour for 12 hours.  Repeating sodium.  Previous sodiums were 135 141 and 132.  He stated that he had a 35 pound weight loss since his back surgery.  He still continues to smoke.  Has severe COPD is followed by pulmonary.  No acute pulmonary process seen on chest x-ray.  His hyponatremia seems out of proportion to pulmonary disease.   He has been having a lot of postural dizziness and hypotension and was instructed to aggressively hydrate at his last visit with Dr. Lenna Gilford on 08/04/17.  He was also instructed to hold his valsartan and HCTZ but it was okay to restart slowly when he was feeling better and blood pressure dictated.  Perhaps this is all free water related.  Severe COPD/Tobacco use -Followed by pulmonary.  Notes reviewed. Cessation.   Follow today. If Sodium improves and feels better, OK for  DC.   For questions or updates, please contact [place] Please consult [email or web address] for contact info under Cardiology/STEMI.      Signed, Candee Furbish, MD  09/15/2017, 8:55 AM

## 2017-09-15 NOTE — Progress Notes (Signed)
CRITICAL VALUE ALERT  Critical Value: Troponin 3.46 Date & Time Notied: 09/15/17 @ 0230  Provider Notified: Dr. Fuller Mandril  Orders Received/Actions taken: No new orders

## 2017-09-15 NOTE — H&P (Signed)
CARDIOLOGY H&P  HPI: Brad Turner is a 77 y.o. male w/ history of CAD s/p multiple POBA (last in 2011 to distal OM2), HTN, HLD, COPD, active smoking, and pericarditis who presents with chest pain.  Symptoms started ~ 3 hours prior to presentation. Acute in onset. Also associated with mild mid-line back pain. Non-radiating. Not improved with SLN. Feels similar to his pericarditis pain that he's had before. Never had an MI per his recollection. No recent fevers. No chest trauma. No syncope, presycnope, dizziness, lightheadedness, SOB, diaphoresis.   In ED, patient found to have troponin elevated to 1.76. ECG revealing subtle sub-mm ST elevations in II, III, aVF. Cath lab activated for emergent angiography.   Review of Systems:     Cardiac Review of Systems: {Y] = yes [ ]  = no  Chest Pain [ X ]  Resting SOB [   ] Exertional SOB  [  ]  Orthopnea [  ]   Pedal Edema [   ]    Palpitations [  ] Syncope  [  ]   Presyncope [   ]  General Review of Systems: [Y] = yes [  ]=no Constitional: recent weight change [  ]; anorexia [  ]; fatigue [  ]; nausea [  ]; night sweats [  ]; fever [  ]; or chills [  ];                                                                     Dental: poor dentition[  ];   Eye : blurred vision [  ]; diplopia [   ]; vision changes [  ];  Amaurosis fugax[  ]; Resp: cough [  ];  wheezing[  ];  hemoptysis[  ]; shortness of breath[  ]; paroxysmal nocturnal dyspnea[  ]; dyspnea on exertion[  ]; or orthopnea[  ];  GI:  gallstones[  ], vomiting[  ];  dysphagia[  ]; melena[  ];  hematochezia [  ]; heartburn[  ];   GU: kidney stones [  ]; hematuria[  ];   dysuria [  ];  nocturia[  ];               Skin: rash [  ], swelling[  ];, hair loss[  ];  peripheral edema[  ];  or itching[  ]; Musculosketetal: myalgias[  ];  joint swelling[  ];  joint erythema[  ];  joint pain[  ];  back pain[  ];  Heme/Lymph: bruising[  ];  bleeding[  ];  anemia[  ];  Neuro: TIA[  ];  headaches[  ];  stroke[   ];  vertigo[  ];  seizures[  ];   paresthesias[  ];  difficulty walking[  ];  Psych:depression[  ]; anxiety[  ];  Endocrine: diabetes[  ];  thyroid dysfunction[  ];  Other:  Past Medical History:  Diagnosis Date  . Allergy    seasonal  . Arthritis   . Barrett esophagus   . BPH (benign prostatic hyperplasia)   . Bronchitis    recently   . Bruise    forehead and under both eyes  . CAD (coronary artery disease)    takes Plavix and Aspirin daily  . Chronic back  pain   . COPD (chronic obstructive pulmonary disease) (HCC)    Albuterol inhaler as needed.Uses Symbicort daily  . Depression    doesn't take any meds  . Emphysema lung ([place])   . Fall    fell on 10/06/16 with loss of consiousness for a short time.States he remembers getting up but remembers nothing else.  Marland Kitchen GERD (gastroesophageal reflux disease)    takes Omeprazole daily  . History of colon polyps    benign  . History of kidney stones   . Hyperlipidemia    takes Fenofibrate and Lipitor daily  . Hypertension    takes HCTZ and Diovan daily  . Joint pain   . Joint swelling   . 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 Neuropathy    Lyrica every couple of days.  . Nocturia   . Pneumonia    hx of  . PONV (postoperative nausea and vomiting)   . Skin cancer   . Stroke [facility]) 1995   Affected balance; and has very emotional if watching a movie  . Syncope    yr ago  . Urgency of urination    takes Myrbetriq daily    Prior to Admission medications   Medication Sig Start Date End Date Taking? Authorizing Provider  acetaminophen (TYLENOL) 500 MG tablet Take 1,000 mg by mouth every 6 (six) hours as needed (for back pain.).    [provider]  albuterol (PROVENTIL HFA;VENTOLIN HFA) 108 (90 BASE) MCG/ACT inhaler Inhale 2 puffs into the lungs every 6 (six) hours as needed for wheezing or shortness of breath. Reported on 11/29/2015    [provider]  atorvastatin  (LIPITOR) 80 MG tablet Take 80 mg by mouth daily.     [provider]  clopidogrel (PLAVIX) 75 MG tablet Take 75 mg by mouth daily.    [provider]  DULoxetine (CYMBALTA) 60 MG capsule Take 60 mg by mouth daily.    [provider]  fenofibrate (TRICOR) 145 MG tablet Take 145 mg by mouth daily.     [provider]  folic acid (FOLVITE) 790 MCG tablet Take 400 mcg by mouth daily.    [provider]  hydrochlorothiazide (MICROZIDE) 12.5 MG capsule Take 12.5 mg by mouth daily.    [provider]  HYDROcodone-acetaminophen (NORCO/VICODIN) 5-325 MG tablet Take 1 tablet by mouth 2 (two) times daily.    [provider]  MYRBETRIQ 50 MG TB24 tablet Take 50 mg by mouth daily.  05/03/15   [provider]  NITROSTAT 0.4 MG SL tablet Place 0.4 mg under the tongue every 5 (five) minutes as needed for chest pain. Reported on 11/29/2015 05/29/13   [provider]  omeprazole (PRILOSEC) 40 MG capsule TAKE ONE CAPSULE BY MOUTH TWICE DAILY 04/22/16   Armbruster, Carlota Raspberry, MD  SYMBICORT 160-4.5 MCG/ACT inhaler INHALE TWO PUFFS INTO LUNGS TWICE DAILY 04/19/14   Clance, Armando Reichert, MD  tadalafil (CIALIS) 5 MG tablet Take 5 mg by mouth daily. For BPH    [provider]  tiZANidine (ZANAFLEX) 4 MG capsule Take 4 mg by mouth 4 (four) times daily.    [provider]  valsartan (DIOVAN) 160 MG tablet Take 160 mg by mouth daily.    [provider]     Allergies  Allergen Reactions  . Ivp Dye [Iodinated Diagnostic Agents] Hives and Shortness Of Breath    reaction was when pt was  77 years old.  Clancy Gourd [Nitrofurantoin] Other (See Comments)    Fever that lasted several months    Social History   Socioeconomic History  . Marital status: Single    Spouse name: Not on file  . Number of children: 1  . Years of education: Not on file  . Highest education level: Not on file  Social Needs  . Financial resource  strain: Not on file  . Food insecurity - worry: Not on file  . Food insecurity - inability: Not on file  . Transportation needs - medical: Not on file  . Transportation needs - non-medical: Not on file  Occupational History  . Occupation: Network engineer OF [employer]    Employer: [employer]  Tobacco Use  . Smoking status: Current Every Braxton Smoker    Packs/Burck: 1.50    Years: 58.00    Pack years: 87.00    Types: Cigarettes  . Smokeless tobacco: Never Used  Substance and Sexual Activity  . Alcohol use: Yes    Alcohol/week: 8.4 oz    Types: 14 Standard drinks or equivalent per week    Comment: every Troyer, vodka- 1-2 a Ferrentino   . Drug use: No  . Sexual activity: Not on file  Other Topics Concern  . Not on file  Social History Narrative  . Not on file    Family History  Problem Relation Age of Onset  . Stroke Mother   . Rheum arthritis Mother   . Heart disease Father   . Colon cancer Unknown        ? mat. uncle died age 103  . Colon polyps Neg Hx   . Esophageal cancer Neg Hx   . Rectal cancer Neg Hx   . Stomach cancer Neg Hx     PHYSICAL EXAM: Vitals:   09/15/17 0006 09/15/17 0007  BP: 103/75 115/68  Pulse: 97 97  Resp: 17 (!) 23  SpO2: 100% 100%   General:  Well appearing. No respiratory difficulty HEENT: normal Neck: supple. no JVD. No lymphadenopathy or thryomegaly appreciated. Cor: PMI nondisplaced. Regular rate & rhythm. No rubs, gallops or murmurs. Lungs: clear Abdomen: soft, nontender, nondistended. No hepatosplenomegaly. No bruits or masses. Good bowel sounds. Extremities: no cyanosis, clubbing, rash, edema Neuro: alert & oriented x 3, cranial nerves grossly intact. moves all 4 extremities w/o difficulty. Affect pleasant.  ECG: Sinus tachycardia, HR 100 bpm, normal axis, anterior Q waves, PR depression throughout the limb leads, very subtle sub-mm ST elevation in II, III, aVF  Results for orders placed or performed during the hospital encounter of 09/14/17 (from the  past 24 hour(s))  I-stat troponin, ED     Status: Abnormal   Collection Time: 09/14/17 11:41 PM  Result Value Ref Range   Troponin i, poc 1.76 (HH) 0.00 - 0.08 ng/mL   Comment NOTIFIED PHYSICIAN    Comment 3          CBC     Status: Abnormal   Collection Time: 09/14/17 11:44 PM  Result Value Ref Range   WBC 6.0 4.0 - 10.5 K/uL   RBC 3.39 (L) 4.22 - 5.81 MIL/uL   Hemoglobin 11.8 (L) 13.0 - 17.0 g/dL   HCT 32.7 (L) 39.0 - 52.0 %   MCV 96.5 78.0 - 100.0 fL   MCH 34.8 (H) 26.0 - 34.0 pg   MCHC 36.1 (H) 30.0 - 36.0 g/dL   RDW 13.8 11.5 - 15.5 %   Platelets 300 150 - 400 K/uL  I-stat chem 8, ed     Status: Abnormal   Collection Time: 09/15/17 12:15 AM  Result Value Ref Range   Sodium 123 (L) 135 - 145 mmol/L   Potassium 3.4 (L) 3.5 - 5.1 mmol/L   Chloride 85 (L) 101 - 111 mmol/L   BUN 17 6 - 20 mg/dL   Creatinine, Ser 1.30 (H) 0.61 - 1.24 mg/dL   Glucose, Bld 131 (H) 65 - 99 mg/dL   Calcium, Ion 1.02 (L) 1.15 - 1.40 mmol/L   TCO2 24 22 - 32 mmol/L   Hemoglobin 13.6 13.0 - 17.0 g/dL   HCT 40.0 39.0 - 52.0 %  I-Stat CG4 Lactic Acid, ED     Status: None   Collection Time: 09/15/17 12:16 AM  Result Value Ref Range   Lactic Acid, Venous 1.77 0.5 - 1.9 mmol/L   No results found.  ASSESSMENT: Brad Turner is a 77 y.o. male w/ history of CAD s/p multiple POBA (last in 2011 to distal OM2), HTN, HLD, COPD, active smoking, and pericarditis who presents with chest pain, found to have troponin elevation and subtle ST changes by ECG suggestive of Type I ACS. Patient underwent emergent coronary angiography, which revealed non-obstructive CAD without a culprit lesion. A ventriculogram at the conclusion of the case was highly consistent with stress cardiomyopathy (i.e.t[place]o cardiomyopathy).   PLAN/DISCUSSION: #) Stress-induced cardiomyopathy: no evidence of cardiogenic shock or decompensated heart failure; LVEDP 13 during angiogram - TTE in AM - check lipids, A1c - ASA 81mg  daily - no  indication for continued heparin drip - cont home atorva 80mg  daily - cont home valsartan - K > 4, Mg > 2 - cont home plavix 75mg  daily - hold home HCTZ given hyponatremia - start low dose metoprolol - smoking cessation counseling  - monitor closely for evidence of allergic reaction given known allergy to contrast and emergent angiogram  #) Hyponatremia: patient endorses increased water intake for the past few days, euvolemic on exam. Most likely due to polydipsia - free water restriction - normal saline infusion @ 75 cc/hr for 12 hours  - hold home HCTZ - renal consult if no improvement  Marcie Mowers, MD Cardiology Fellow, PGY-5

## 2017-09-15 NOTE — Progress Notes (Signed)
  Echocardiogram 2D Echocardiogram has been performed.  Brad Turner 09/15/2017, 10:47 AM

## 2017-09-15 NOTE — Progress Notes (Signed)
     Serum sodium remains low. [phone] Fluid restrict 1 L Vodka "a couple a Akens". Mild confusion. CIWA protocol. Will check BMET Q4 x 3.  Replete Kcl I discussed with Dr. Hal Hope with [facility]. He will check the BMET tonight.  Holding the HCTZ.  Candee Furbish, MD

## 2017-09-15 NOTE — ED Provider Notes (Signed)
[facility] EMERGENCY DEPARTMENT Provider Note   CSN: [id number] Arrival date & time: 09/14/17  2321     History   Chief Complaint Chief Complaint  Patient presents with  . Chest Pain  . Weakness    HPI Brad Turner is a 77 y.o. male.  Level 5 caveat for condition.  Patient presents with generalized weakness, fatigue, shortness of breath, and chest pain that radiates to back between shoulder blades. Symptoms started about 3 hours to arrival, progressively worsening. He took nitroglycerin at home without much change. When he awoke this morning, he was feeling weak and unwell. Did not have chest pain until several hours ago. No focal weakness numbness or tingling.  Patient reports CAD with 4 angioplasties, no stents. Pain reminds him of when he pericarditis in the past.  Patient pale appearing.    The history is provided by the patient.  Chest Pain   Associated symptoms include shortness of breath and weakness. Pertinent negatives include no abdominal pain, no nausea and no vomiting.  Weakness  Associated symptoms include shortness of breath and chest pain. Pertinent negatives include no vomiting.    Past Medical History:  Diagnosis Date  . Allergy    seasonal  . Arthritis   . Barrett esophagus   . BPH (benign prostatic hyperplasia)   . Bronchitis    recently   . Bruise    forehead and under both eyes  . CAD (coronary artery disease)    takes Plavix and Aspirin daily  . Chronic back pain   . COPD (chronic obstructive pulmonary disease) (HCC)    Albuterol inhaler as needed.Uses Symbicort daily  . Depression    doesn't take any meds  . Emphysema lung ([place])   . Fall    fell on 10/06/16 with loss of consiousness for a short time.States he remembers getting up but remembers nothing else.  Marland Kitchen GERD (gastroesophageal reflux disease)    takes Omeprazole daily  . History of colon polyps    benign  . History of kidney stones   . Hyperlipidemia    takes  Fenofibrate and Lipitor daily  . Hypertension    takes HCTZ and Diovan daily  . Joint pain   . Joint swelling   . 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 Neuropathy    Lyrica every couple of days.  . Nocturia   . Pneumonia    hx of  . PONV (postoperative nausea and vomiting)   . Skin cancer   . Stroke [facility]) 1995   Affected balance; and has very emotional if watching a movie  . Syncope    yr ago  . Urgency of urination    takes Myrbetriq daily    Patient Active Problem List   Diagnosis Date Noted  . Chronic back pain 12/01/2016  . S/P lumbar spinal fusion 12/01/2016  . Dysthymia 12/01/2016  . Radiculopathy 10/17/2016  . COPD mixed type ([place]) 06/10/2016  . Abnormal CT of the chest 06/10/2016  . Dysphagia   . Cigarette smoker 06/07/2015  . Essential hypertension, benign 10/30/2014    Class: Chronic  . Syncope 10/29/2014  . Cough syncope 10/07/2014  . COPD exacerbation ([place]) 09/29/2014  . Pulmonary nodules 11/22/2013  . Chest pain, unspecified 10/15/2013  . Personal history of colonic polyps 06/04/2010  . COPD (chronic obstructive pulmonary disease) with emphysema ([place]) 02/26/2010  . CHEST PAIN, ATYPICAL 02/26/2010  . BARRETTS ESOPHAGUS  05/18/2009  . HYPERLIPIDEMIA 04/28/2008  . Coronary atherosclerosis 04/28/2008  . CVA 04/28/2008  . GERD 04/28/2008  . ARTHRITIS 04/28/2008  . NEPHROLITHIASIS, HX OF 04/28/2008    Past Surgical History:  Procedure Laterality Date  . APPENDECTOMY    . CARDIAC CATHETERIZATION     x 4. Most recent one in 2011 at [facility](3 previous were done 20 yrs before)  . cataracts Bilateral   . COLONOSCOPY    . CYSTOSCOPY  07/21/2013   Dr. Jeffie Pollock  . DENTAL SURGERY     with sedation  . ESOPHAGEAL MANOMETRY N/A 09/20/2015   Procedure: ESOPHAGEAL MANOMETRY (EM);  Surgeon: Manus Gunning, MD;  Location: [facility];  Service: Gastroenterology;  Laterality: N/A;  . LUMBAR LAMINECTOMY/DECOMPRESSION  MICRODISCECTOMY N/A 01/12/2015   Procedure: LUMBAR LAMINECTOMY/DECOMPRESSION MICRODISCECTOMY 3 LEVELS;  Surgeon: Phylliss Bob, MD;  Location: [facility];  Service: Orthopedics;  Laterality: N/A;  Lumbar 3-4, lumbar 4-5, lumbar 5-sacrum 1 decompression  . parathyroid adenoma removed    . POLYPECTOMY    . ROTATOR CUFF REPAIR     bilateral  . TONSILLECTOMY    . UPPER GASTROINTESTINAL ENDOSCOPY         Home Medications    Prior to Admission medications   Medication Sig Start Date End Date Taking? Authorizing Provider  acetaminophen (TYLENOL) 500 MG tablet Take 1,000 mg by mouth every 6 (six) hours as needed (for back pain.).    [provider]  albuterol (PROVENTIL HFA;VENTOLIN HFA) 108 (90 BASE) MCG/ACT inhaler Inhale 2 puffs into the lungs every 6 (six) hours as needed for wheezing or shortness of breath. Reported on 11/29/2015    [provider]  atorvastatin (LIPITOR) 80 MG tablet Take 80 mg by mouth daily.     [provider]  clopidogrel (PLAVIX) 75 MG tablet Take 75 mg by mouth daily.    [provider]  DULoxetine (CYMBALTA) 60 MG capsule Take 60 mg by mouth daily.    [provider]  fenofibrate (TRICOR) 145 MG tablet Take 145 mg by mouth daily.     [provider]  folic acid (FOLVITE) 673 MCG tablet Take 400 mcg by mouth daily.    [provider]  hydrochlorothiazide (MICROZIDE) 12.5 MG capsule Take 12.5 mg by mouth daily.    [provider]  HYDROcodone-acetaminophen (NORCO/VICODIN) 5-325 MG tablet Take 1 tablet by mouth 2 (two) times daily.    [provider]  MYRBETRIQ 50 MG TB24 tablet Take 50 mg by mouth daily.  05/03/15   [provider]  NITROSTAT 0.4 MG SL tablet Place 0.4 mg under the tongue every 5 (five) minutes as needed for chest pain. Reported on 11/29/2015 05/29/13   [provider]  omeprazole (PRILOSEC) 40 MG capsule TAKE ONE CAPSULE BY MOUTH TWICE DAILY 04/22/16   Armbruster,  Carlota Raspberry, MD  SYMBICORT 160-4.5 MCG/ACT inhaler INHALE TWO PUFFS INTO LUNGS TWICE DAILY 04/19/14   Clance, Armando Reichert, MD  tadalafil (CIALIS) 5 MG tablet Take 5 mg by mouth daily. For BPH    [provider]  tiZANidine (ZANAFLEX) 4 MG capsule Take 4 mg by mouth 4 (four) times daily.    [provider]  valsartan (DIOVAN) 160 MG tablet Take 160 mg by mouth daily.    [provider]    Family History Family History  Problem Relation Age of Onset  . Stroke Mother   . Rheum arthritis Mother   . Heart disease Father   . Colon cancer  Unknown        ? mat. uncle died age 32  . Colon polyps Neg Hx   . Esophageal cancer Neg Hx   . Rectal cancer Neg Hx   . Stomach cancer Neg Hx     Social History Social History   Tobacco Use  . Smoking status: Current Every Osinski Smoker    Packs/Tessler: 1.50    Years: 58.00    Pack years: 87.00    Types: Cigarettes  . Smokeless tobacco: Never Used  Substance Use Topics  . Alcohol use: Yes    Alcohol/week: 8.4 oz    Types: 14 Standard drinks or equivalent per week    Comment: every Ellery, vodka- 1-2 a [place]   . Drug use: No     Allergies   Ivp dye [iodinated diagnostic agents] and Macrodantin [nitrofurantoin]   Review of Systems Review of Systems  Constitutional: Positive for activity change, appetite change and fatigue.  Respiratory: Positive for chest tightness and shortness of breath.   Cardiovascular: Positive for chest pain.  Gastrointestinal: Negative for abdominal pain, nausea and vomiting.  Genitourinary: Negative for dysuria, hematuria and urgency.  Musculoskeletal: Positive for arthralgias and myalgias.  Skin: Negative for wound.  Neurological: Positive for weakness and light-headedness.    all other systems are negative except as noted in the HPI and PMH.    Physical Exam Updated Vital Signs BP 115/68   Pulse 97   Resp (!) 23   Ht 5\' 5"  (1.651 m)   Wt 61.2 kg (135 lb)   SpO2 100%   BMI 22.47 kg/m    Physical Exam  Constitutional: He is oriented to person, place, and time. He appears well-developed and well-nourished. No distress.  Pale appearing  HENT:  Head: Normocephalic and atraumatic.  Mouth/Throat: Oropharynx is clear and moist. No oropharyngeal exudate.  Eyes: Conjunctivae and EOM are normal. Pupils are equal, round, and reactive to light.  Pale conjunctiva  Neck: Normal range of motion. Neck supple.  No meningismus.  Cardiovascular: Normal rate, regular rhythm, normal heart sounds and intact distal pulses.  No murmur heard. Equal radial pulses and grip strengths  Pulmonary/Chest: Effort normal and breath sounds normal. No respiratory distress. He exhibits no tenderness.  Abdominal: Soft. There is no tenderness. There is no rebound and no guarding.  Musculoskeletal: Normal range of motion. He exhibits edema. He exhibits no tenderness.  +2 pretibial edema  Neurological: He is alert and oriented to person, place, and time. No cranial nerve deficit. He exhibits normal muscle tone. Coordination normal.  No ataxia on finger to nose bilaterally. No pronator drift. 5/5 strength throughout. CN 2-12 intact.Equal grip strength. Sensation intact.   Skin: Skin is warm. He is diaphoretic.  Psychiatric: He has a normal mood and affect. His behavior is normal.  Nursing note and vitals reviewed.    ED Treatments / Results  Labs (all labs ordered are listed, but only abnormal results are displayed) Labs Reviewed  CBC - Abnormal; Notable for the following components:      Result Value   RBC 3.39 (*)    Hemoglobin 11.8 (*)    HCT 32.7 (*)    MCH 34.8 (*)    MCHC 36.1 (*)    All other components within normal limits  I-STAT TROPONIN, ED - Abnormal; Notable for the following components:   Troponin i, poc 1.76 (*)    All other components within normal limits  I-STAT CHEM 8, ED - Abnormal; Notable for the following  components:   Sodium 123 (*)    Potassium 3.4 (*)    Chloride 85  (*)    Creatinine, Ser 1.30 (*)    Glucose, Bld 131 (*)    Calcium, Ion 1.02 (*)    All other components within normal limits  BASIC METABOLIC PANEL  TROPONIN I  LIPID PANEL  I-STAT CG4 LACTIC ACID, ED  TYPE AND SCREEN    EKG  EKG Interpretation  Date/Time:  Sunday September 14 2017 23:33:30 EST Ventricular Rate:  106 PR Interval:  138 QRS Duration: 84 QT Interval:  372 QTC Calculation: 494 R Axis:   58 Text Interpretation:  Sinus tachycardia Otherwise normal ECG inferior ST elevation with PR depression Confirmed by Ezequiel Essex [phone]) on 09/15/2017 12:03:47 AM       Radiology Dg Chest Portable 1 View  Result Date: 09/15/2017 CLINICAL DATA:  Weakness, LEFT chest pain. History of pericarditis with similar symptoms. EXAM: PORTABLE CHEST 1 VIEW COMPARISON:  CT chest August 07, 2017. FINDINGS: Cardiac silhouette is upper limits of normal in size, mediastinal silhouette is nonsuspicious. Mildly calcified aortic knob. Mild hyperinflation. No pleural effusion or focal consolidation. No pneumothorax. Surgical clips LEFT neck compatible with thyroidectomy. Soft tissue planes and included osseous structures are nonsuspicious. Multiple EKG lines overlie the patient and may obscure subtle underlying pathology. IMPRESSION: Borderline cardiomegaly.  No acute pulmonary process. Aortic Atherosclerosis (ICD10-I70.0). Electronically Signed   By: Elon Alas M.D.   On: 09/15/2017 00:22    Procedures Procedures (including critical care time)  Medications Ordered in ED Medications  0.9 %  sodium chloride infusion (20 mLs Intravenous New Bag/Given 09/15/17 0002)  diphenhydrAMINE (BENADRYL) capsule 50 mg (not administered)    Or  diphenhydrAMINE (BENADRYL) injection 50 mg (not administered)  nitroGLYCERIN (NITROSTAT) SL tablet 0.4 mg (not administered)  aspirin chewable tablet 324 mg (324 mg Oral Given 09/15/17 0003)  heparin injection 3,650 Units (3,650 Units Intravenous Given 09/15/17  0003)  hydrocortisone sodium succinate (SOLU-CORTEF) injection 200 mg (200 mg Intravenous Given 09/15/17 0015)     Initial Impression / Assessment and Plan / ED Course  I have reviewed the triage vital signs and the nursing notes.  Pertinent labs & imaging results that were available during my care of the patient were reviewed by me and considered in my medical decision making (see chart for details).     Generalized weakness, short of breath, chest pain radiating to back. Onset 3 hours ago.  Pale appearing.   EKG with subtle J point ST elevation inferiorly with PR depression. D/w Dr. Martinique on arrival. Troponin 1.76.  In light of elevated troponin and patient's appearance, Dr. Martinique agrees with activation of STEMI.  ASA and heparin given.  Steroids and antihistamines for dye allergy.   Equal radial pulses and grip strengths. Equal upper extremity BP. Lower suspicion for aortic dissection.  Cardiology at bedside.  NTG given. Patient taken emergently to cath lab. BP and mental status remain stable in the ED. Patient protecting airway.  Incidental hyponatremia on labs. Dr. Neena Rhymes of cardiology aware.  CRITICAL CARE Performed by: Ezequiel Essex Total critical care time: 35 minutes Critical care time was exclusive of separately billable procedures and treating other patients. Critical care was necessary to treat or prevent imminent or life-threatening deterioration. Critical care was time spent personally by me on the following activities: development of treatment plan with patient and/or surrogate as well as nursing, discussions with consultants, evaluation of patient's response to treatment, examination of patient, obtaining  history from patient or surrogate, ordering and performing treatments and interventions, ordering and review of laboratory studies, ordering and review of radiographic studies, pulse oximetry and re-evaluation of patient's condition.  Final Clinical  Impressions(s) / ED Diagnoses   Final diagnoses:  None    ED Discharge Orders    None       Kashif Pooler, Annie Main, MD 09/15/17 0134

## 2017-09-15 NOTE — ED Notes (Signed)
Cards at bedside

## 2017-09-16 ENCOUNTER — Other Ambulatory Visit: Payer: Self-pay

## 2017-09-16 ENCOUNTER — Encounter (HOSPITAL_COMMUNITY): Payer: Self-pay

## 2017-09-16 DIAGNOSIS — F10239 Alcohol dependence with withdrawal, unspecified: Secondary | ICD-10-CM | POA: Diagnosis present

## 2017-09-16 DIAGNOSIS — I10 Essential (primary) hypertension: Secondary | ICD-10-CM

## 2017-09-16 DIAGNOSIS — Z720 Tobacco use: Secondary | ICD-10-CM | POA: Diagnosis present

## 2017-09-16 DIAGNOSIS — R410 Disorientation, unspecified: Secondary | ICD-10-CM | POA: Diagnosis present

## 2017-09-16 DIAGNOSIS — J449 Chronic obstructive pulmonary disease, unspecified: Secondary | ICD-10-CM | POA: Diagnosis present

## 2017-09-16 DIAGNOSIS — F329 Major depressive disorder, single episode, unspecified: Secondary | ICD-10-CM | POA: Diagnosis present

## 2017-09-16 DIAGNOSIS — Z8673 Personal history of transient ischemic attack (TIA), and cerebral infarction without residual deficits: Secondary | ICD-10-CM

## 2017-09-16 DIAGNOSIS — F10231 Alcohol dependence with withdrawal delirium: Secondary | ICD-10-CM

## 2017-09-16 DIAGNOSIS — F32A Depression, unspecified: Secondary | ICD-10-CM | POA: Diagnosis present

## 2017-09-16 DIAGNOSIS — F10939 Alcohol use, unspecified with withdrawal, unspecified: Secondary | ICD-10-CM | POA: Diagnosis present

## 2017-09-16 DIAGNOSIS — Z8719 Personal history of other diseases of the digestive system: Secondary | ICD-10-CM

## 2017-09-16 DIAGNOSIS — E871 Hypo-osmolality and hyponatremia: Secondary | ICD-10-CM

## 2017-09-16 LAB — BASIC METABOLIC PANEL
ANION GAP: 12 (ref 5–15)
Anion gap: 10 (ref 5–15)
BUN: 19 mg/dL (ref 6–20)
BUN: 20 mg/dL (ref 6–20)
CHLORIDE: 94 mmol/L — AB (ref 101–111)
CHLORIDE: 94 mmol/L — AB (ref 101–111)
CO2: 24 mmol/L (ref 22–32)
CO2: 23 mmol/L (ref 22–32)
Calcium: 8.7 mg/dL — ABNORMAL LOW (ref 8.9–10.3)
Calcium: 8.6 mg/dL — ABNORMAL LOW (ref 8.9–10.3)
Creatinine, Ser: 1.52 mg/dL — ABNORMAL HIGH (ref 0.61–1.24)
Creatinine, Ser: 1.46 mg/dL — ABNORMAL HIGH (ref 0.61–1.24)
GFR calc non Af Amer: 43 mL/min — ABNORMAL LOW (ref >60)
GFR calc non Af Amer: 46 mL/min — ABNORMAL LOW (ref >60)
GFR, EST AFRICAN AMERICAN: 50 mL/min — AB (ref >60)
GFR, EST AFRICAN AMERICAN: 53 mL/min — AB (ref >60)
Glucose, Bld: 80 mg/dL (ref 65–99)
Glucose, Bld: 83 mg/dL (ref 65–99)
POTASSIUM: 4.1 mmol/L (ref 3.5–5.1)
POTASSIUM: 3.7 mmol/L (ref 3.5–5.1)
Sodium: 130 mmol/L — ABNORMAL LOW (ref 135–145)
Sodium: 127 mmol/L — ABNORMAL LOW (ref 135–145)

## 2017-09-16 LAB — URINALYSIS, ROUTINE W REFLEX MICROSCOPIC
BILIRUBIN URINE: NEGATIVE
Glucose, UA: NEGATIVE mg/dL
HGB URINE DIPSTICK: NEGATIVE
Ketones, ur: NEGATIVE mg/dL
Leukocytes, UA: NEGATIVE
Nitrite: NEGATIVE
PROTEIN: NEGATIVE mg/dL
SPECIFIC GRAVITY, URINE: 1.023 (ref 1.005–1.030)
pH: 5 (ref 5.0–8.0)

## 2017-09-16 LAB — OSMOLALITY, URINE: OSMOLALITY UR: 594 mosm/kg (ref 300–900)

## 2017-09-16 LAB — TSH: TSH: 3.074 u[IU]/mL (ref 0.350–4.500)

## 2017-09-16 LAB — SODIUM, URINE, RANDOM: SODIUM UR: 54 mmol/L

## 2017-09-16 LAB — OSMOLALITY: Osmolality: 268 mOsm/kg — ABNORMAL LOW (ref 275–295)

## 2017-09-16 MED ORDER — LORAZEPAM 2 MG/ML IJ SOLN: 0.0000 mg | Freq: Two times a day (BID) | INTRAMUSCULAR | Status: DC

## 2017-09-16 MED ORDER — LORAZEPAM 1 MG PO TABS: 1.0000 mg | ORAL_TABLET | Freq: Four times a day (QID) | ORAL | Status: DC | PRN

## 2017-09-16 MED ORDER — LORAZEPAM 2 MG/ML IJ SOLN
0.0000 mg | Freq: Four times a day (QID) | INTRAMUSCULAR | Status: AC
  Administered 2017-09-16 (×3): 1 mg via INTRAVENOUS
  Filled 2017-09-16 (×3): qty 1

## 2017-09-16 MED ORDER — THIAMINE HCL 100 MG/ML IJ SOLN: 100.0000 mg | Freq: Every day | INTRAMUSCULAR | Status: DC

## 2017-09-16 MED ORDER — DIAZEPAM 2 MG PO TABS
2.0000 mg | ORAL_TABLET | Freq: Three times a day (TID) | ORAL | Status: DC
  Administered 2017-09-16 – 2017-09-17 (×6): 2 mg via ORAL
  Filled 2017-09-16 (×7): qty 1

## 2017-09-16 MED ORDER — VITAMIN B-1 100 MG PO TABS
100.0000 mg | ORAL_TABLET | Freq: Every day | ORAL | Status: DC
  Administered 2017-09-16 – 2017-09-18 (×3): 100 mg via ORAL
  Filled 2017-09-16 (×3): qty 1

## 2017-09-16 MED ORDER — LORAZEPAM 2 MG/ML IJ SOLN: 1.0000 mg | Freq: Four times a day (QID) | INTRAMUSCULAR | Status: DC | PRN

## 2017-09-16 MED ORDER — ADULT MULTIVITAMIN W/MINERALS CH: 1.0000 | ORAL_TABLET | Freq: Every day | ORAL | Status: DC

## 2017-09-16 MED ORDER — VITAMIN B-1 100 MG PO TABS: 100.0000 mg | ORAL_TABLET | Freq: Every day | ORAL | Status: DC

## 2017-09-16 MED ORDER — FOLIC ACID 1 MG PO TABS: 1.0000 mg | ORAL_TABLET | Freq: Every day | ORAL | Status: DC

## 2017-09-16 MED ORDER — ADULT MULTIVITAMIN W/MINERALS CH
1.0000 | ORAL_TABLET | Freq: Every day | ORAL | Status: DC
  Administered 2017-09-16 – 2017-09-18 (×3): 1 via ORAL
  Filled 2017-09-16 (×2): qty 1

## 2017-09-16 MED ORDER — FOLIC ACID 1 MG PO TABS
1.0000 mg | ORAL_TABLET | Freq: Every day | ORAL | Status: DC
  Administered 2017-09-16 – 2017-09-18 (×3): 1 mg via ORAL
  Filled 2017-09-16 (×2): qty 1

## 2017-09-16 MED ORDER — LORAZEPAM 2 MG/ML IJ SOLN: 0.0000 mg | Freq: Four times a day (QID) | INTRAMUSCULAR | Status: DC

## 2017-09-16 MED FILL — Heparin Sodium (Porcine) 2 Unit/ML in Sodium Chloride 0.9%: INTRAMUSCULAR | Qty: 1000 | Status: AC

## 2017-09-16 MED FILL — Nitroglycerin IV Soln 100 MCG/ML in D5W: INTRA_ARTERIAL | Qty: 10 | Status: AC

## 2017-09-16 NOTE — NC FL2 (Signed)
[place] LEVEL OF CARE SCREENING TOOL     IDENTIFICATION  Patient Name: Brad Turner Birthdate: 1941-06-22 Sex: male Admission Date (Current Location): 09/14/2017  [facility] and [place] Number:  Herbalist and Address:  [facility] [place]. [facility], [place] [address], [place], [place] [address]      Provider Number: [id number]  Attending Physician Name and Address:  Martinique, Peter M, MD  Relative Name and Phone Number:  Xyon Lukasik, son, [phone]    Current Level of Care: Hospital Recommended Level of Care: [place] Prior Approval Number:    Date Approved/Denied:   PASRR Number: [id number]  Discharge Plan: SNF    Current Diagnoses: Patient Active Problem List   Diagnosis Date Noted  . Acute delirium 09/16/2017  . Acute hyponatremia 09/16/2017  . Alcohol withdrawal ([place]) 09/16/2017  . 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 Tobacco abuse 09/16/2017  . Depression 09/16/2017  . History of CVA (cerebrovascular accident) 09/16/2017  . History of Barrett's esophagus 09/16/2017  . Takotsubo cardiomyopathy 09/15/2017  . Chronic back pain 12/01/2016  . S/P lumbar spinal fusion 12/01/2016  . Dysthymia 12/01/2016  . Radiculopathy 10/17/2016  . COPD mixed type ([place]) 06/10/2016  . Abnormal CT of the chest 06/10/2016  . Dysphagia   . Cigarette smoker 06/07/2015  . Essential hypertension, benign 10/30/2014    Class: Chronic  . Syncope 10/29/2014  . Cough syncope 10/07/2014  . COPD exacerbation ([place]) 09/29/2014  . Pulmonary nodules 11/22/2013  . Chest pain, unspecified 10/15/2013  . Personal history of colonic polyps 06/04/2010  . COPD (chronic obstructive pulmonary disease) with emphysema ([place]) 02/26/2010  . CHEST PAIN, ATYPICAL 02/26/2010  . BARRETTS ESOPHAGUS 05/18/2009  . HYPERLIPIDEMIA 04/28/2008  . Coronary atherosclerosis 04/28/2008  . CVA 04/28/2008  . GERD 04/28/2008  .  ARTHRITIS 04/28/2008  . NEPHROLITHIASIS, HX OF 04/28/2008    Orientation RESPIRATION BLADDER Height & Weight     Self  Normal Continent Weight: 143 lb 4.8 oz (65 kg) Height:  5\' 5"  (165.1 cm)  BEHAVIORAL SYMPTOMS/MOOD NEUROLOGICAL BOWEL NUTRITION STATUS      Continent Diet(please see DC summary)  AMBULATORY STATUS COMMUNICATION OF NEEDS Skin   Limited Assist Verbally Normal                       Personal Care Assistance Level of Assistance  Bathing, Feeding, Dressing Bathing Assistance: Limited assistance Feeding assistance: Independent Dressing Assistance: Limited assistance     Functional Limitations Info  Sight, Hearing, Speech Sight Info: Adequate Hearing Info: Adequate Speech Info: Adequate    SPECIAL CARE FACTORS FREQUENCY  PT (By licensed PT)     PT Frequency: 5x/week              Contractures Contractures Info: Not present    Additional Factors Info  Code Status, Allergies Code Status Info: Full Allergies Info: Ivp Dye Iodinated Diagnostic Agents, Macrodantin Nitrofurantoin           Current Medications (09/16/2017):  This is the current hospital active medication list Current Facility-Administered Medications  Medication Dose Route Frequency Provider Last Rate Last Dose  . 0.9 %  sodium chloride infusion   Intravenous Continuous Rancour, Stephen, MD 20 mL/hr at 09/15/17 0002 20 mL at 09/15/17 0002  . acetaminophen (TYLENOL) tablet 650 mg  650 mg Oral Q4H PRN Martinique, Peter M, MD      . atorvastatin (LIPITOR) tablet 80 mg  80  mg Oral QHS Martinique, Peter M, MD   80 mg at 09/15/17 2105  . clopidogrel (PLAVIX) tablet 75 mg  75 mg Oral Daily Martinique, Peter M, MD   75 mg at 09/16/17 3536  . diazepam (VALIUM) tablet 2 mg  2 mg Oral Q8H Samella Parr, NP   2 mg at 09/16/17 0933  . DULoxetine (CYMBALTA) DR capsule 60 mg  60 mg Oral Daily Martinique, Peter M, MD   60 mg at 09/16/17 1443  . fenofibrate tablet 160 mg  160 mg Oral Daily Martinique, Peter M, MD   160  mg at 09/16/17 0933  . folic acid (FOLVITE) tablet 1 mg  1 mg Oral Daily Samella Parr, NP      . heparin injection 5,000 Units  5,000 Units Subcutaneous Q8H Martinique, Peter M, MD   5,000 Units at 09/15/17 2106  . HYDROcodone-acetaminophen (NORCO/VICODIN) 5-325 MG per tablet 1 tablet  1 tablet Oral BID Martinique, Peter M, MD   1 tablet at 09/16/17 0933  . LORazepam (ATIVAN) injection 0-4 mg  0-4 mg Intravenous Q6H Samella Parr, NP       Followed by  . [START ON 09/18/2017] LORazepam (ATIVAN) injection 0-4 mg  0-4 mg Intravenous Q12H Samella Parr, NP      . LORazepam (ATIVAN) tablet 1 mg  1 mg Oral Q6H PRN Samella Parr, NP       Or  . LORazepam (ATIVAN) injection 1 mg  1 mg Intravenous Q6H PRN Samella Parr, NP      . metoprolol succinate (TOPROL-XL) 24 hr tablet 50 mg  50 mg Oral Daily [name], MD   50 mg at 09/16/17 0933  . mirabegron ER (MYRBETRIQ) tablet 50 mg  50 mg Oral Daily Martinique, Peter M, MD   50 mg at 09/16/17 0933  . mometasone-formoterol (DULERA) 200-5 MCG/ACT inhaler 2 puff  2 puff Inhalation BID Martinique, Peter M, MD   2 puff at 09/16/17 [phone]  . multivitamin with minerals tablet 1 tablet  1 tablet Oral Daily Samella Parr, NP   1 tablet at 09/16/17 0934  . nitroGLYCERIN (NITROSTAT) SL tablet 0.4 mg  0.4 mg Sublingual Q5 min PRN Martinique, Peter M, MD      . ondansetron [facility]) injection 4 mg  4 mg Intravenous Q6H PRN Martinique, Peter M, MD      . pantoprazole (PROTONIX) EC tablet 40 mg  40 mg Oral Daily Martinique, Peter M, MD   40 mg at 09/16/17 0933  . sodium chloride flush (NS) 0.9 % injection 3 mL  3 mL Intravenous Q12H Martinique, Peter M, MD   3 mL at 09/16/17 0935  . sodium chloride flush (NS) 0.9 % injection 3 mL  3 mL Intravenous PRN Martinique, Peter M, MD      . thiamine (VITAMIN B-1) tablet 100 mg  100 mg Oral Daily Samella Parr, NP   100 mg at 09/16/17 0867   Or  . thiamine (B-1) injection 100 mg  100 mg Intravenous Daily Samella Parr, NP          Discharge Medications: Please see discharge summary for a list of discharge medications.  Relevant Imaging Results:  Relevant Lab Results:   Additional Information SSN: [id number]  Estanislado Emms, LCSW

## 2017-09-16 NOTE — Progress Notes (Signed)
CSW acknowledges consult for SNF. Patient in active DTs and not oriented per RN. CSW to follow and complete assessment when appropriate.  Brad Turner, [place]

## 2017-09-16 NOTE — Consult Note (Signed)
Consultation Note   Brad Turner HCW:[id number] DOB: 02-23-1941 DOA: 09/14/2017   PCP: Haywood Pao, MD   Consulting physician: Lorin Mercy  Requesting physician: Marlou Porch  Reason for consultation: Assist with management of alcohol withdrawal, acute delirium and hyponatremia  HPI: Brad Turner is a 77 y.o. male with medical history significant for COPD Gold stage II with ongoing tobacco abuse, depression, daily alcohol use, history of Barrett's esophagus, hypertension, and remote history of stroke.  He was initially admitted on obtain by the cardiology service in the context of 3 hours of chest pain with a troponin of 1.76 and subtle submillimeter ST elevations in the inferior leads.  He was taken emergently to the Cath Lab where he was found to have moderate nonobstructive CAD with other findings consistent with Takotsubo cardiomyopathy with an EF of 30-35% with grade 1 diastolic dysfunction.  Since admission patient has developed acute delirium as well as hyponatremia.  Thiazide diuretic was discontinued and he was placed on 1 L fluid restriction with improvement of sodium from 122-130.  Additional history obtained by rounding cardiologist who determined that patient had been drinking large volumes of water at home.  Apparently he had verbalized to his pulmonologist during recent visit of dizziness and weakness upon standing and was felt he was volume depleted so therefore was instructed to increase water intake.   Review of Systems:  **Difficult to obtain adequate review of systems from patient given his waxing and waning mental status.  History primarily obtained from the medical record and from rounding cardiologist Dr. Marlou Porch    Past Medical History:  Diagnosis Date  . Allergy    seasonal  . Arthritis   . Barrett esophagus   . BPH (benign prostatic hyperplasia)   . Bronchitis    recently   . Bruise    forehead and under both eyes  . CAD (coronary artery disease)    takes  Plavix and Aspirin daily  . Chronic back pain   . COPD (chronic obstructive pulmonary disease) (HCC)    Albuterol inhaler as needed.Uses Symbicort daily  . Depression    doesn't take any meds  . Emphysema lung ([place])   . Fall    fell on 10/06/16 with loss of consiousness for a short time.States he remembers getting up but remembers nothing else.  Marland Kitchen GERD (gastroesophageal reflux disease)    takes Omeprazole daily  . History of colon polyps    benign  . History of kidney stones   . Hyperlipidemia    takes Fenofibrate and Lipitor daily  . Hypertension    takes HCTZ and Diovan daily  . Joint pain   . Joint swelling   . 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 Neuropathy    Lyrica every couple of days.  . Nocturia   . Pneumonia    hx of  . PONV (postoperative nausea and vomiting)   . Skin cancer   . Stroke [facility]) 1995   Affected balance; and has very emotional if watching a movie  . Syncope    yr ago  . Urgency of urination    takes Myrbetriq daily    Past Surgical History:  Procedure Laterality Date  . APPENDECTOMY    . CARDIAC CATHETERIZATION     x 4. Most recent one in 2011 at [facility](3 previous were done 20 yrs before)  . cataracts Bilateral   . COLONOSCOPY    .  CYSTOSCOPY  07/21/2013   Dr. Jeffie Pollock  . DENTAL SURGERY     with sedation  . ESOPHAGEAL MANOMETRY N/A 09/20/2015   Procedure: ESOPHAGEAL MANOMETRY (EM);  Surgeon: Manus Gunning, MD;  Location: [facility];  Service: Gastroenterology;  Laterality: N/A;  . LEFT HEART CATH AND CORONARY ANGIOGRAPHY N/A 09/15/2017   Procedure: LEFT HEART CATH AND CORONARY ANGIOGRAPHY;  Surgeon: Martinique, Peter M, MD;  Location: [place] CV LAB;  Service: Cardiovascular;  Laterality: N/A;  . LUMBAR LAMINECTOMY/DECOMPRESSION MICRODISCECTOMY N/A 01/12/2015   Procedure: LUMBAR LAMINECTOMY/DECOMPRESSION MICRODISCECTOMY 3 LEVELS;  Surgeon: Phylliss Bob, MD;  Location: [place];  Service: Orthopedics;   Laterality: N/A;  Lumbar 3-4, lumbar 4-5, lumbar 5-sacrum 1 decompression  . parathyroid adenoma removed    . POLYPECTOMY    . ROTATOR CUFF REPAIR     bilateral  . TONSILLECTOMY    . UPPER GASTROINTESTINAL ENDOSCOPY      Social History   Socioeconomic History  . Marital status: Single    Spouse name: Not on file  . Number of children: 1  . Years of education: Not on file  . Highest education level: Not on file  Social Needs  . Financial resource strain: Not on file  . Food insecurity - worry: Not on file  . Food insecurity - inability: Not on file  . Transportation needs - medical: Not on file  . Transportation needs - non-medical: Not on file  Occupational History  . Occupation: Network engineer OF [employer]    Employer: [employer]  Tobacco Use  . Smoking status: Current Every Sleeper Smoker    Packs/[place]: 1.50    Years: 58.00    Pack years: 87.00    Types: Cigarettes  . Smokeless tobacco: Never Used  Substance and Sexual Activity  . Alcohol use: Yes    Alcohol/week: 8.4 oz    Types: 14 Standard drinks or equivalent per week    Comment: every [place]o, vodka- 1-2 a Deshotel   . Drug use: No  . Sexual activity: Not on file  Other Topics Concern  . Not on file  Social History Narrative  . Not on file    Mobility: Independent prior to admission Work history: Retired Actuary at [employer]  . Ivp Dye [Iodinated Diagnostic Agents] Hives and Shortness Of Breath    reaction was when pt was 77 years old.  Clancy Gourd [Nitrofurantoin] Other (See Comments)    Fever that lasted several months    Family History  Problem Relation Age of Onset  . Stroke Mother   . Rheum arthritis Mother   . Heart disease Father   . Colon cancer Unknown        ? mat. uncle died age 6  . Colon polyps Neg Hx   . Esophageal cancer Neg Hx   . Rectal cancer Neg Hx   . Stomach cancer Neg Hx      Prior to Admission medications   Medication  Sig Start Date End Date Taking? Authorizing Provider  acetaminophen (TYLENOL) 500 MG tablet Take 1,000 mg by mouth every 6 (six) hours as needed (for back pain.).   Yes [provider]  albuterol (PROVENTIL HFA;VENTOLIN HFA) 108 (90 BASE) MCG/ACT inhaler Inhale 2 puffs into the lungs every 6 (six) hours as needed for wheezing or shortness of breath. Reported on 11/29/2015   Yes [provider]  atorvastatin (LIPITOR) 80 MG tablet Take 80 mg by mouth daily.  Yes [provider]  clopidogrel (PLAVIX) 75 MG tablet Take 75 mg by mouth daily.   Yes [provider]  DULoxetine (CYMBALTA) 60 MG capsule Take 60 mg by mouth daily.   Yes [provider]  fenofibrate (TRICOR) 145 MG tablet Take 145 mg by mouth daily.    Yes [provider]  folic acid (FOLVITE) 573 MCG tablet Take 400 mcg by mouth daily.   Yes [provider]  hydrochlorothiazide (MICROZIDE) 12.5 MG capsule Take 12.5 mg by mouth daily.   Yes [provider]  HYDROcodone-acetaminophen (NORCO/VICODIN) 5-325 MG tablet Take 1 tablet by mouth 2 (two) times daily.   Yes [provider]  MYRBETRIQ 50 MG TB24 tablet Take 50 mg by mouth daily.  05/03/15  Yes [provider]  NITROSTAT 0.4 MG SL tablet Place 0.4 mg under the tongue every 5 (five) minutes as needed for chest pain. Reported on 11/29/2015 05/29/13  Yes [provider]  omeprazole (PRILOSEC) 40 MG capsule TAKE ONE CAPSULE BY MOUTH TWICE DAILY 04/22/16  Yes Armbruster, Carlota Raspberry, MD  SYMBICORT 160-4.5 MCG/ACT inhaler INHALE TWO PUFFS INTO LUNGS TWICE DAILY 04/19/14  Yes Clance, Armando Reichert, MD  tadalafil (CIALIS) 5 MG tablet Take 5 mg by mouth daily. For BPH   Yes [provider]  tiZANidine (ZANAFLEX) 4 MG capsule Take 4 mg by mouth 3 (three) times daily as needed for muscle spasms.    Yes [provider]  valsartan (DIOVAN) 160 MG tablet Take 160 mg by mouth daily.   Yes [provider]  VOLTAREN 1 % GEL Apply 4 g topically 4 (four) times daily as needed for pain. 09/11/17   [provider]    Physical Exam: Vitals:   09/15/17 2003 09/16/17 0000 09/16/17 0632 09/16/17 0831  BP:   129/81 129/89  Pulse:  75 77   Resp:   20 17  Temp:   98.4 F (36.9 C)   TempSrc:   Oral   SpO2: 94%  99%   Weight:   65 kg (143 lb 4.8 oz)   Height:          Constitutional: NAD, mildly restless, comfortable Eyes: PERRL, lids and conjunctivae normal ENMT: Mucous membranes are dry. Posterior pharynx clear of any exudate or lesions.age-appropriate dentition.  Neck: normal, supple, no masses, no thyromegaly Respiratory: clear to auscultation bilaterally, no wheezing, no crackles. Normal respiratory effort. No accessory muscle use.  Cardiovascular: Regular rate and rhythm, no murmurs / rubs / gallops. No extremity edema. 2+ pedal pulses. No carotid bruits.  Abdomen: no tenderness, no masses palpated. No hepatosplenomegaly. Bowel sounds positive.  Musculoskeletal: no clubbing / cyanosis. No joint deformity upper and lower extremities. Good ROM, no contractures. Normal muscle tone.  Skin: no rashes, lesions, ulcers. No induration Neurologic: CN 2-12 grossly intact. Sensation intact, DTR normal. Strength 4-5/5 x all 4 extremities.  Mildly tremulous in upper extremities.  Patient observed having difficulty managing diet/eating breakfast tray secondary to tremulousness Psychiatric:  Alert and oriented x name and year.  Repeatedly states he is currently in [facility].  Patient repeatedly goes off subject during conversations noting subject he is focusing on his not consistent with subject matter that was being discussed.  He does have waxing and waning mentation noting he was joking during the orientation portion and initially stated [facility] Tawni Pummel is Software engineer but then immediately stated "I am just kidding".   Labs on Admission: I have personally reviewed following  labs  and imaging studies  CBC: Recent Labs  Lab 09/14/17 2344 09/15/17 0015  WBC 6.0  --   HGB 11.8* 13.6  HCT 32.7* 40.0  MCV 96.5  --   PLT 300  --    Basic Metabolic Panel: Recent Labs  Lab 09/14/17 2344 09/15/17 0015 09/15/17 0223 09/15/17 1405 09/15/17 2100 09/16/17 0418  NA 121* 123*  --  122* 124* 130*  K 3.3* 3.4*  --  3.3* 3.3* 4.1  CL 84* 85*  --  85* 90* 94*  CO2 20*  --   --  22 22 24   GLUCOSE 131* 131* 129* 128* 101* 80  BUN 16 17  --  15 19 19   CREATININE 1.29* 1.30*  --  1.22 1.51* 1.52*  CALCIUM 8.8*  --   --  8.5* 8.2* 8.7*   GFR: Estimated Creatinine Clearance: 37.1 mL/min (A) (by C-G formula based on SCr of 1.52 mg/dL (H)). Liver Function Tests: No results for input(s): AST, ALT, ALKPHOS, BILITOT, PROT, ALBUMIN in the last 168 hours. No results for input(s): LIPASE, AMYLASE in the last 168 hours. No results for input(s): AMMONIA in the last 168 hours. Coagulation Profile: No results for input(s): INR, PROTIME in the last 168 hours. Cardiac Enzymes: Recent Labs  Lab 09/14/17 2344 09/15/17 0218  TROPONINI 1.35* 3.46*   BNP (last 3 results) No results for input(s): PROBNP in the last 8760 hours. HbA1C: No results for input(s): HGBA1C in the last 72 hours. CBG: No results for input(s): GLUCAP in the last 168 hours. Lipid Profile: Recent Labs    09/14/17 2354  CHOL 131  HDL 84  LDLCALC 38  TRIG 43  CHOLHDL 1.6   Thyroid Function Tests: No results for input(s): TSH, T4TOTAL, FREET4, T3FREE, THYROIDAB in the last 72 hours. Anemia Panel: No results for input(s): VITAMINB12, FOLATE, FERRITIN, TIBC, IRON, RETICCTPCT in the last 72 hours. Urine analysis:    Component Value Date/Time   COLORURINE AMBER (A) 10/09/2016 0958   APPEARANCEUR CLEAR 10/09/2016 0958   LABSPEC 1.027 10/09/2016 0958   PHURINE 5.0 10/09/2016 0958   GLUCOSEU NEGATIVE 10/09/2016 0958   HGBUR NEGATIVE 10/09/2016 0958   BILIRUBINUR SMALL (A) 10/09/2016 0958    KETONESUR 5 (A) 10/09/2016 0958   PROTEINUR NEGATIVE 10/09/2016 0958   UROBILINOGEN 1.0 01/09/2015 1427   NITRITE NEGATIVE 10/09/2016 0958   LEUKOCYTESUR NEGATIVE 10/09/2016 0958   Sepsis Labs: @LABRCNTIP (procalcitonin:4,lacticidven:4) )No results found for this or any previous visit (from the past 240 hour(s)).   Radiological Exams on Admission: Dg Chest Portable 1 View  Result Date: 09/15/2017 CLINICAL DATA:  Weakness, LEFT chest pain. History of pericarditis with similar symptoms. EXAM: PORTABLE CHEST 1 VIEW COMPARISON:  CT chest August 07, 2017. FINDINGS: Cardiac silhouette is upper limits of normal in size, mediastinal silhouette is nonsuspicious. Mildly calcified aortic knob. Mild hyperinflation. No pleural effusion or focal consolidation. No pneumothorax. Surgical clips LEFT neck compatible with thyroidectomy. Soft tissue planes and included osseous structures are nonsuspicious. Multiple EKG lines overlie the patient and may obscure subtle underlying pathology. IMPRESSION: Borderline cardiomegaly.  No acute pulmonary process. Aortic Atherosclerosis (ICD10-I70.0). Electronically Signed   By: Elon Alas M.D.   On: 09/15/2017 00:22     Assessment/Plan Active Problems:   Acute delirium -Likely multifactorial with primary driving factor acute alcohol withdrawal with current hyponatremia influencing mentation -Treat underlying causes (see below) -Obtain urinalysis and culture to rule out infectious etiology, ck CBC    Acute hyponatremia/mild acute kidney injury -Hyponatremia reflective  of recent thiazide diuretic use as well as increased free water at home as reported by patient; regular EtOH use also contributory -Thiazide diuretics stopped by cardiology with institution of 1000 cc/24 hr fluid restriction with improvement in sodium from 122-130.  Continue fluid restriction -Patient on Cymbalta which can cause hyponatremia/SIADH-may need to consider dose reduction if felt to be  culprit -Obtain urinary sodium and osmolality as well as serum osmolality  -We will check TSH -Cardiology wishes to avoid volume resuscitation noting low EF secondary to stress cardiomyopathy (Takotsubo) -Discussed with cardiology and we will hold ARB in the context of acute kidney injury especially after receiving IV contrast for catheterization-follow labs-cardiology will resume ARB once renal function improved -Oral intake of fluids over the past 24 hours was 720 cc    Alcohol withdrawal  -Patient admits to 2 glasses(unknown actual volume) of vodka daily plus triple sec; patient apparently told cardiology PA he was also drinking wine along with vodka at times -Continue med-surg CIWA protocol with combo scheduled and prn Ativan noting patient ineligible for scheduled Ativan if CIWA score less than 5 -Begin Valium 2 mg every 8 hours -Encourage patient to mobilize in hall with assistance as well as out of bed to chair at least every shift    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/ongoing tobacco abuse -Currently stable without exacerbation symptoms or wheezing -Continue Dulera -Pulmonologist has repeatedly discussed the need for tobacco cessation but the patient states he is not interested -Patient does have pulmonary nodules but recent CT of the chest December 2018 without any concerning features for malignancy therefore doubt bronchogenic carcinoma as etiology to patient's hyponatremia    Depression -On Cymbalta prior to admission -Cymbalta can contribute to hyponatremia/SIADH so consideration may need to be given to dose adjustment and eventual tapering with utilization of alternate drug    History of CVA (cerebrovascular accident) -No focal neurological deficits concerning for possible CVA as etiology to patient's delirium    Coronary atherosclerosis -Moderate nonobstructive disease with cardiology presiding over medical management    Essential hypertension,  benign -Management per cardiology    Takotsubo cardiomyopathy -EF 30-35% and as discussed above ARB placed on hold secondary to mild AK I post-cath and with initiation of fluid restriction for hyponatremia -Management per cardiology    History of Barrett's esophagus -Patient is reporting significant anorexia and states he has lost weight although in review of medical record weight in December was 136 pounds, weight at presentation 135 pounds and current weight 143 pounds -Last EGD was in March 2017 and revealed irregular mucosa in the esophagus with erythematous mucosa in the gastric body with interval healing of previously documented gastric ulcer and gastritis with normal duodenal bulb.  Subsequent pathology benign for malignancy or H pylori -On twice daily Prilosec at home -Once delirium improved may benefit from nutrition consultation prior to discharge -Symptoms of GI bleeding reported since admission       ELLIS,ALLISON L. ANP-BC [facility] Pager [phone]   If 7PM-7AM, please contact night-coverage [email or web address] Password [email or web address]  09/16/2017, 9:12 AM

## 2017-09-16 NOTE — Progress Notes (Addendum)
Progress Note  Patient Name: Brad Turner Date of Encounter: 09/16/2017  Primary Cardiologist: No primary care provider on file.   Subjective   Confusion over night. ?ETOH withdrawal. Na correcting with free water restriction. No CP, no SOB. Laying sideways across bed.   Inpatient Medications    Scheduled Meds: . atorvastatin  80 mg Oral QHS  . clopidogrel  75 mg Oral Daily  . DULoxetine  60 mg Oral Daily  . fenofibrate  160 mg Oral Daily  . folic acid  1 mg Oral Daily  . heparin  5,000 Units Subcutaneous Q8H  . HYDROcodone-acetaminophen  1 tablet Oral BID  . irbesartan  150 mg Oral Daily  . LORazepam  0-4 mg Intravenous Q6H   Followed by  . [START ON 09/18/2017] LORazepam  0-4 mg Intravenous Q12H  . metoprolol succinate  50 mg Oral Daily  . mirabegron ER  50 mg Oral Daily  . mometasone-formoterol  2 puff Inhalation BID  . multivitamin with minerals  1 tablet Oral Daily  . pantoprazole  40 mg Oral Daily  . sodium chloride flush  3 mL Intravenous Q12H  . thiamine  100 mg Oral Daily   Or  . thiamine  100 mg Intravenous Daily   Continuous Infusions: . sodium chloride 20 mL (09/15/17 0002)  . sodium chloride     PRN Meds: sodium chloride, acetaminophen, LORazepam **OR** LORazepam, nitroGLYCERIN, ondansetron (ZOFRAN) IV, sodium chloride flush   Vital Signs    Vitals:   09/15/17 2000 09/15/17 2003 09/16/17 0000 09/16/17 0632  BP:    129/81  Pulse: 84  75 77  Resp:    20  Temp:    98.4 F (36.9 C)  TempSrc:    Oral  SpO2:  94%  99%  Weight:    143 lb 4.8 oz (65 kg)  Height:        Intake/Output Summary (Last 24 hours) at 09/16/2017 0816 Last data filed at 09/16/2017 4098 Gross per 24 hour  Intake 1036.25 ml  Output 300 ml  Net 736.25 ml   Filed Weights   09/14/17 2325 09/15/17 0514 09/16/17 1191  Weight: 135 lb (61.2 kg) 140 lb 14 oz (63.9 kg) 143 lb 4.8 oz (65 kg)    Telemetry    NSR, no change - Personally Reviewed  ECG    09/14/17-sinus tach,  103, nonspecific ST-T wave changes, questionable PR depression.- Personally Reviewed  Physical Exam   GEN: Well nourished, well developed, in no acute distress  HEENT: normal  Neck: no JVD, carotid bruits, or masses Cardiac: RRR; no murmurs, rubs, or gallops,no edema  Respiratory:  clear to auscultation bilaterally, normal work of breathing GI: soft, nontender, nondistended, + BS MS: no deformity or atrophy  Skin: warm and dry, no rash Neuro:  Alert but confused. No tremor noted Psych: confused  Labs    Chemistry Recent Labs  Lab 09/15/17 1405 09/15/17 2100 09/16/17 0418  NA 122* 124* 130*  K 3.3* 3.3* 4.1  CL 85* 90* 94*  CO2 22 22 24   GLUCOSE 128* 101* 80  BUN 15 19 19   CREATININE 1.22 1.51* 1.52*  CALCIUM 8.5* 8.2* 8.7*  GFRNONAA 57* 44* 43*  GFRAA >60 51* 50*  ANIONGAP 15 12 12      Hematology Recent Labs  Lab 09/14/17 2344 09/15/17 0015  WBC 6.0  --   RBC 3.39*  --   HGB 11.8* 13.6  HCT 32.7* 40.0  MCV 96.5  --  MCH 34.8*  --   MCHC 36.1*  --   RDW 13.8  --   PLT 300  --     Cardiac Enzymes Recent Labs  Lab 09/14/17 2344 09/15/17 0218  TROPONINI 1.35* 3.46*    Recent Labs  Lab 09/14/17 2341  TROPIPOC 1.76*     BNPNo results for input(s): BNP, PROBNP in the last 168 hours.   DDimer No results for input(s): DDIMER in the last 168 hours.   Radiology    Dg Chest Portable 1 View  Result Date: 09/15/2017 CLINICAL DATA:  Weakness, LEFT chest pain. History of pericarditis with similar symptoms. EXAM: PORTABLE CHEST 1 VIEW COMPARISON:  CT chest August 07, 2017. FINDINGS: Cardiac silhouette is upper limits of normal in size, mediastinal silhouette is nonsuspicious. Mildly calcified aortic knob. Mild hyperinflation. No pleural effusion or focal consolidation. No pneumothorax. Surgical clips LEFT neck compatible with thyroidectomy. Soft tissue planes and included osseous structures are nonsuspicious. Multiple EKG lines overlie the patient and may  obscure subtle underlying pathology. IMPRESSION: Borderline cardiomegaly.  No acute pulmonary process. Aortic Atherosclerosis (ICD10-I70.0). Electronically Signed   By: Elon Alas M.D.   On: 09/15/2017 00:22    Cardiac Studies   Cath 09/15/17  Prox LAD lesion is 25% stenosed.  Ost 1st Mrg to 1st Mrg lesion is 60% stenosed.  Ost 2nd Mrg to 2nd Mrg lesion is 65% stenosed.  There is moderate left ventricular systolic dysfunction.  LV end diastolic pressure is mildly elevated.  The left ventricular ejection fraction is 35-45% by visual estimate.  Dist LAD lesion is 60% stenosed.   1. Modest nonobstructive CAD involving the distal LAD, OM1, and OM2. 2. Moderate LV dysfunction with typical pattern of Takotsubo cardiomyopathy 3. Mildly elevated LVEDP- 25 mm Hg  Plan: medical management. Would avoid using right radial access in the future.    Patient Profile     77 y.o. male admitted with non-ST elevation myocardial infarction with urgent cardiac catheterization revealing moderate nonobstructive CAD with ejection fraction in the 35-45% range with a typical pattern of stress-induced cardiomyopathy with hypertension hyperlipidemia COPD active smoker and prior pericarditis. Now with apparent ETOH withdrawal.   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  No PCI performed.   Continue with aspirin and aggressive secondary prevention.  On dual antiplatelet therapy. -Toprol-XL 50 mg - Currently on irbesartan 150.  We will continue. -LVEDP 25 mmHg. Currently appears euvolemic  Hyponatremia sodium 121, 123 on admission now 130. -Possibly from increased water intake over the past few days.  Continue with free water restriction. Improved.  We stopped his HCTZ.  Received IV normal saline 75 an hour for 12 hours.  Previous sodiums were 135 141 and 132.  He stated yesterday that he had a 35 pound weight loss since his back  surgery.  He still continues to smoke.  Has severe COPD is followed by pulmonary.  No acute pulmonary process seen on chest x-ray.  His hyponatremia seems out of proportion to pulmonary disease.  He has been having a lot of postural dizziness and hypotension and was instructed to aggressively hydrate at his last visit with Dr. Lenna Gilford on 08/04/17.  He was also instructed to hold his valsartan and HCTZ but it was okay to restart slowly when he was feeling better and blood pressure dictated.  Perhaps this is all free water related.  Severe COPD/Tobacco use -Followed by pulmonary.  Notes reviewed. Cessation.   ETOH withdrawal  -  will consult [facility]. Wrote for CIWA protocol. Looking in record, has a few Vodka drinks a [place]. Now that his Na is going in the right direction, I think that his delirium is because of ETOH withdrawal.   AKI  - mild increase creat. Holding ARB. Irbesartan 150mg     For questions or updates, please contact [place] Please consult [email or web address] for contact info under Cardiology/STEMI.      Signed, Candee Furbish, MD  09/16/2017, 8:16 AM

## 2017-09-16 NOTE — Progress Notes (Signed)
Pt has been more confused than at the beginning of the shift. Pt knows he is at [employer] and his name. However, pt called me Gerald Stabs which he says is his son. Pt also voided on himself in the bed.

## 2017-09-17 DIAGNOSIS — Z8673 Personal history of transient ischemic attack (TIA), and cerebral infarction without residual deficits: Secondary | ICD-10-CM

## 2017-09-17 DIAGNOSIS — Z8719 Personal history of other diseases of the digestive system: Secondary | ICD-10-CM

## 2017-09-17 DIAGNOSIS — F329 Major depressive disorder, single episode, unspecified: Secondary | ICD-10-CM

## 2017-09-17 DIAGNOSIS — Z720 Tobacco use: Secondary | ICD-10-CM

## 2017-09-17 DIAGNOSIS — I213 ST elevation (STEMI) myocardial infarction of unspecified site: Secondary | ICD-10-CM

## 2017-09-17 DIAGNOSIS — R410 Disorientation, unspecified: Secondary | ICD-10-CM

## 2017-09-17 DIAGNOSIS — J449 Chronic obstructive pulmonary disease, unspecified: Secondary | ICD-10-CM

## 2017-09-17 LAB — COMPREHENSIVE METABOLIC PANEL
ALT: 37 U/L (ref 17–63)
AST: 74 U/L — AB (ref 15–41)
Albumin: 3.3 g/dL — ABNORMAL LOW (ref 3.5–5.0)
Alkaline Phosphatase: 46 U/L (ref 38–126)
Anion gap: 11 (ref 5–15)
BILIRUBIN TOTAL: 1.1 mg/dL (ref 0.3–1.2)
BUN: 20 mg/dL (ref 6–20)
CHLORIDE: 95 mmol/L — AB (ref 101–111)
CO2: 23 mmol/L (ref 22–32)
CREATININE: 1.25 mg/dL — AB (ref 0.61–1.24)
Calcium: 8.6 mg/dL — ABNORMAL LOW (ref 8.9–10.3)
GFR calc Af Amer: >60 mL/min (ref >60)
GFR, EST NON AFRICAN AMERICAN: 55 mL/min — AB (ref >60)
Glucose, Bld: 65 mg/dL (ref 65–99)
Potassium: 3.9 mmol/L (ref 3.5–5.1)
Sodium: 129 mmol/L — ABNORMAL LOW (ref 135–145)
Total Protein: 6.2 g/dL — ABNORMAL LOW (ref 6.5–8.1)

## 2017-09-17 LAB — CBC WITH DIFFERENTIAL/PLATELET
BASOS ABS: 0 10*3/uL (ref 0.0–0.1)
Basophils Absolute: 0 10*3/uL (ref 0.0–0.1)
Basophils Relative: 0 %
Basophils Relative: 0 %
EOS ABS: 0 10*3/uL (ref 0.0–0.7)
EOS PCT: 0 %
Eosinophils Absolute: 0.1 10*3/uL (ref 0.0–0.7)
Eosinophils Relative: 1 %
HCT: 29.7 % — ABNORMAL LOW (ref 39.0–52.0)
HEMATOCRIT: 31.6 % — AB (ref 39.0–52.0)
HEMOGLOBIN: 11 g/dL — AB (ref 13.0–17.0)
Hemoglobin: 10.6 g/dL — ABNORMAL LOW (ref 13.0–17.0)
LYMPHS ABS: 1.7 10*3/uL (ref 0.7–4.0)
Lymphocytes Relative: 22 %
Lymphocytes Relative: 26 %
Lymphs Abs: 2.3 10*3/uL (ref 0.7–4.0)
MCH: 34.5 pg — AB (ref 26.0–34.0)
MCH: 34.5 pg — ABNORMAL HIGH (ref 26.0–34.0)
MCHC: 35.7 g/dL (ref 30.0–36.0)
MCHC: 34.8 g/dL (ref 30.0–36.0)
MCV: 96.7 fL (ref 78.0–100.0)
MCV: 99.1 fL (ref 78.0–100.0)
MONO ABS: 0.5 10*3/uL (ref 0.1–1.0)
Monocytes Absolute: 0.8 10*3/uL (ref 0.1–1.0)
Monocytes Relative: 6 %
Monocytes Relative: 8 %
NEUTROS ABS: 5.7 10*3/uL (ref 1.7–7.7)
Neutro Abs: 5.7 10*3/uL (ref 1.7–7.7)
Neutrophils Relative %: 72 %
Neutrophils Relative %: 65 %
PLATELETS: 272 10*3/uL (ref 150–400)
Platelets: 272 10*3/uL (ref 150–400)
RBC: 3.07 MIL/uL — ABNORMAL LOW (ref 4.22–5.81)
RBC: 3.19 MIL/uL — AB (ref 4.22–5.81)
RDW: 14 % (ref 11.5–15.5)
RDW: 14 % (ref 11.5–15.5)
WBC: 8 10*3/uL (ref 4.0–10.5)
WBC: 8.9 10*3/uL (ref 4.0–10.5)

## 2017-09-17 LAB — MAGNESIUM: Magnesium: 1 mg/dL — ABNORMAL LOW (ref 1.7–2.4)

## 2017-09-17 LAB — PHOSPHORUS: Phosphorus: 2.9 mg/dL (ref 2.5–4.6)

## 2017-09-17 NOTE — Clinical Social Work Note (Signed)
Clinical Social Work Assessment  Patient Details  Name: Brad Turner MRN: [id number] Date of Birth: October 14, 1940  Date of referral:  09/17/17               Reason for consult:  Facility Placement, Substance Use/ETOH Abuse                Permission sought to share information with:  Facility Sport and exercise psychologist, Family Supports Permission granted to share information::  No  Name::        Agency::     Relationship::     Contact Information:     Housing/Transportation Living arrangements for the past 2 months:  Single Family Home Source of Information:  Patient Patient Interpreter Needed:  None Criminal Activity/Legal Involvement Pertinent to Current Situation/Hospitalization:  No - Comment as needed Significant Relationships:  Friend, Neighbor Lives with:  Self Do you feel safe going back to the place where you live?  Yes Need for family participation in patient care:  Yes (Comment)  Care giving concerns: Patient from home alone. Consult for ETOH abuse and PT recommendation for SNF.   Social Worker assessment / plan: CSW met with patient and friend, Mel, at bedside. Patient was lethargic and exhibited tangential speech, and seemed confused about time, place, and situation. Patient made many contradictory remarks. Patient preoccupied with appointments he believes he has and asked if he can leave the hospital and come back tomorrow.   CSW attempted to discuss recommendation for SNF. CSW explained reason for recommendation and assessed patient's functioning at home. Patient stated he does not need rehab, though also endorsed not doing well at home, and not being able to walk around well for longer periods of time. Patient stated he lives alone but has several friend in his neighborhood. Patient reported getting a walker six months ago and then stated "I haven't used my walker in six months." CSW reflected on patient's report of prior level of functioning and PT assessment. Patient continued  to decline rehab. CSW explained referral process and insurance coverage. Suspect patient not currently fully oriented.   CSW did not assess alcohol use as friend was present and patient disoriented. CSW to continue to follow and support with disposition planning. CSW to discuss with patient again when more oriented, as well address substance use concerns.  Employment status:  Retired Research officer, political party) PT Recommendations:  [place] / Referral to community resources:  [place]  Patient/Family's Response to care: Patient not fully oriented.  Patient/Family's Understanding of and Emotional Response to Diagnosis, Current Treatment, and Prognosis: Patient not fully oriented. CSW to follow.  Emotional Assessment Appearance:  Appears stated age Attitude/Demeanor/Rapport:  Lethargic, Inconsistent Affect (typically observed):  Calm Orientation:  Oriented to Self, Fluctuating Orientation (Suspected and/or reported Sundowners) Alcohol / Substance use:  Not Applicable Psych involvement (Current and /or in the community):  No (Comment)  Discharge Needs  Concerns to be addressed:  Discharge Planning Concerns, Care Coordination, Substance Abuse Concerns Readmission within the last 30 days:  No Current discharge risk:  Lives alone, Physical Impairment, Substance Abuse Barriers to Discharge:  Continued Medical Work up   Estanislado Emms, LCSW 09/17/2017, 3:17 PM

## 2017-09-17 NOTE — Progress Notes (Signed)
Progress Note  Patient Name: Brad Turner Date of Encounter: 09/17/2017  Primary Cardiologist: No primary care provider on file.   Subjective   On CIWA, confused. No CP.   Inpatient Medications    Scheduled Meds: . atorvastatin  80 mg Oral QHS  . clopidogrel  75 mg Oral Daily  . diazepam  2 mg Oral Q8H  . DULoxetine  60 mg Oral Daily  . fenofibrate  160 mg Oral Daily  . folic acid  1 mg Oral Daily  . heparin  5,000 Units Subcutaneous Q8H  . HYDROcodone-acetaminophen  1 tablet Oral BID  . LORazepam  0-4 mg Intravenous Q6H   Followed by  . [START ON 09/18/2017] LORazepam  0-4 mg Intravenous Q12H  . metoprolol succinate  50 mg Oral Daily  . mirabegron ER  50 mg Oral Daily  . mometasone-formoterol  2 puff Inhalation BID  . multivitamin with minerals  1 tablet Oral Daily  . pantoprazole  40 mg Oral Daily  . sodium chloride flush  3 mL Intravenous Q12H  . thiamine  100 mg Oral Daily   Or  . thiamine  100 mg Intravenous Daily   Continuous Infusions: . sodium chloride 20 mL (09/15/17 0002)   PRN Meds: acetaminophen, LORazepam **OR** LORazepam, nitroGLYCERIN, ondansetron (ZOFRAN) IV, sodium chloride flush   Vital Signs    Vitals:   09/17/17 0609 09/17/17 0859 09/17/17 0901 09/17/17 0909  BP: (!) 128/91   114/82  Pulse: 72 80  71  Resp: 18 16  17   Temp: 97.6 F (36.4 C)   97.6 F (36.4 C)  TempSrc:    Axillary  SpO2: 98% 99% 99% 99%  Weight: 143 lb 11.8 oz (65.2 kg)     Height:        Intake/Output Summary (Last 24 hours) at 09/17/2017 0954 Last data filed at 09/17/2017 0600 Gross per 24 hour  Intake 250 ml  Output 400 ml  Net -150 ml   Filed Weights   09/15/17 0514 09/16/17 0632 09/17/17 0609  Weight: 140 lb 14 oz (63.9 kg) 143 lb 4.8 oz (65 kg) 143 lb 11.8 oz (65.2 kg)    Telemetry    NSR, no change, occas PVC - Personally Reviewed  ECG    09/14/17-sinus tach, 103, nonspecific ST-T wave changes, questionable PR depression.- Personally  Reviewed  Physical Exam   GEN: Well nourished, well developed, in no acute distress  HEENT: normal  Neck: no JVD, carotid bruits, or masses Cardiac: RRR; no murmurs, rubs, or gallops,no edema  Respiratory:  clear to auscultation bilaterally, normal work of breathing GI: soft, nontender, nondistended, + BS MS: no deformity or atrophy  Skin: warm and dry, no rash Neuro:  Sleeping but arousable and confused.  Psych: confused   Labs    Chemistry Recent Labs  Lab 09/15/17 2100 09/16/17 0418 09/16/17 0917  NA 124* 130* 127*  K 3.3* 4.1 3.7  CL 90* 94* 94*  CO2 22 24 23   GLUCOSE 101* 80 83  BUN 19 19 20   CREATININE 1.51* 1.52* 1.46*  CALCIUM 8.2* 8.7* 8.6*  GFRNONAA 44* 43* 46*  GFRAA 51* 50* 53*  ANIONGAP 12 12 10      Hematology Recent Labs  Lab 09/14/17 2344 09/15/17 0015 09/16/17 0917  WBC 6.0  --  8.0  RBC 3.39*  --  3.07*  HGB 11.8* 13.6 10.6*  HCT 32.7* 40.0 29.7*  MCV 96.5  --  96.7  MCH 34.8*  --  34.5*  MCHC 36.1*  --  35.7  RDW 13.8  --  14.0  PLT 300  --  272    Cardiac Enzymes Recent Labs  Lab 09/14/17 2344 09/15/17 0218  TROPONINI 1.35* 3.46*    Recent Labs  Lab 09/14/17 2341  TROPIPOC 1.76*     BNPNo results for input(s): BNP, PROBNP in the last 168 hours.   DDimer No results for input(s): DDIMER in the last 168 hours.   Radiology    No results found.  Cardiac Studies   Cath 09/15/17  Prox LAD lesion is 25% stenosed.  Ost 1st Mrg to 1st Mrg lesion is 60% stenosed.  Ost 2nd Mrg to 2nd Mrg lesion is 65% stenosed.  There is moderate left ventricular systolic dysfunction.  LV end diastolic pressure is mildly elevated.  The left ventricular ejection fraction is 35-45% by visual estimate.  Dist LAD lesion is 60% stenosed.   1. Modest nonobstructive CAD involving the distal LAD, OM1, and OM2. 2. Moderate LV dysfunction with typical pattern of Takotsubo cardiomyopathy 3. Mildly elevated LVEDP- 25 mm Hg  Plan: medical  management. Would avoid using right radial access in the future.    Patient Profile     77 y.o. male admitted with non-ST elevation myocardial infarction with urgent cardiac catheterization revealing moderate nonobstructive CAD with ejection fraction in the 35-45% range with a typical pattern of stress-induced cardiomyopathy with hypertension hyperlipidemia COPD active smoker and prior pericarditis. Now with apparent ETOH withdrawal.   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  No PCI performed.   Continue with aspirin and aggressive secondary prevention.  On dual antiplatelet therapy. -Toprol-XL 50 mg - Off irbesartan 150 with AKI.  -LVEDP 25 mmHg. Currently appears euvolemic  Hyponatremia sodium 121, 123 on admission now 130, 128. -Possibly from increased water intake over the past few days.  Continue with free water restriction. Improved.  We stopped his HCTZ.  Received IV normal saline 75 an hour for 12 hours.  Previous sodiums were 135 141 and 132.  He stated previously that he had a 35 pound weight loss since his back surgery.  He still continues to smoke.  Has severe COPD is followed by pulmonary.  No acute pulmonary process seen on chest x-ray.  His hyponatremia seems out of proportion to pulmonary disease.  He has been having a lot of postural dizziness and hypotension and was instructed to aggressively hydrate at his last visit with Dr. Lenna Gilford on 08/04/17.  He was also instructed to hold his valsartan and HCTZ but it was okay to restart slowly when he was feeling better and blood pressure dictated.  Perhaps this is all free water related. improved  Severe COPD/Tobacco use -Followed by pulmonary.  Notes reviewed. Cessation. No change  ETOH withdrawal  - appreciate help from [facility]. Wrote for CIWA protocol. Looking in record, has a few Vodka drinks a [place]. Now that his Na is going in the right direction, I think that his  delirium is because of ETOH withdrawal.   AKI  - mild increase creat. Holding ARB. Irbesartan 150mg , stable.     For questions or updates, please contact [place] Please consult [email or web address] for contact info under Cardiology/STEMI.      Signed, Candee Furbish, MD  09/17/2017, 9:54 AM

## 2017-09-17 NOTE — Progress Notes (Signed)
A man named Rae Halsted (friend and Rosanne Sack band member with patient) stopped by and asked patient for permission to take patients keys to open a closet door at the [employer] to retrieve music for their practice session. Patient gave verbal consent for Rae Halsted to take his set of keys. Rae Halsted asked patient if anyone was feeding his cat and patient said no and asked Gerald Stabs to look in on his cat and feed it for him. Gerald Stabs gave me his phone number which is [phone]

## 2017-09-17 NOTE — Progress Notes (Signed)
Physical Therapy Treatment Patient Details Name: Brad Turner MRN: [id number] DOB: 05-06-1941 Today's Date: 09/17/2017    History of Present Illness Pt is a 77 y/o male with a PMH significant of CAD s/p multiple POBA, HTN, HLD, COPD, pericarditis. Pt presents with chest pain and was found to have troponin elevated to 1.76 and ECG revealed NSTEMI. He is now s/p cardiac catheterization.     PT Comments    Pt very lethargic which limited his functional mobility this session. Pt only able to tolerate bed mobility, transfers with min A and very short distance ambulation (~10') with use of RW and min A. Pt remained lethargic throughout and fatigued very rapidly with activity. Pt would continue to benefit from skilled physical therapy services at this time while admitted and after d/c to address the below listed limitations in order to improve overall safety and independence with functional mobility.   Follow Up Recommendations  SNF;Supervision/Assistance - 24 hour     Equipment Recommendations  Rolling walker with 5" wheels;3in1 (PT)    Recommendations for Other Services       Precautions / Restrictions Precautions Precautions: Fall Restrictions Weight Bearing Restrictions: No    Mobility  Bed Mobility Overal bed mobility: Needs Assistance Bed Mobility: Supine to Sit;Sit to Supine     Supine to sit: Min assist Sit to supine: Min guard   General bed mobility comments: increased time and effort, assist to elevate trunk to achieve sitting upright at EOB  Transfers Overall transfer level: Needs assistance Equipment used: Rolling walker (2 wheeled) Transfers: Sit to/from Stand Sit to Stand: Min assist         General transfer comment: pt using momentum with one unsuccessful attempt with LOB posteriorly. Second attempt was successful with min A. Pt performed sit<>stand from EOB x3  Ambulation/Gait Ambulation/Gait assistance: Min assist Ambulation Distance (Feet): 10  Feet Assistive device: Rolling walker (2 wheeled) Gait Pattern/deviations: Step-to pattern;Step-through pattern;Decreased step length - right;Decreased step length - left;Decreased stride length;Shuffle Gait velocity: Decreased Gait velocity interpretation: Below normal speed for age/gender General Gait Details: pt remained lethargic throughout and fatigued very rapidly with initiation of gait. Pt requesting to return to sitting and reported it was due to his poor endurance.   Stairs            Wheelchair Mobility    Modified Rankin (Stroke Patients Only)       Balance Overall balance assessment: Needs assistance Sitting-balance support: Feet supported;No upper extremity supported Sitting balance-Leahy Scale: Fair     Standing balance support: During functional activity;Bilateral upper extremity supported Standing balance-Leahy Scale: Poor Standing balance comment: Reliant on UE support for balance.                             Cognition Arousal/Alertness: Lethargic Behavior During Therapy: Flat affect Overall Cognitive Status: Impaired/Different from baseline Area of Impairment: Memory;Following commands;Safety/judgement;Problem solving                     Memory: Decreased short-term memory Following Commands: Follows one step commands inconsistently;Follows one step commands with increased time Safety/Judgement: Decreased awareness of safety;Decreased awareness of deficits   Problem Solving: Difficulty sequencing;Requires verbal cues;Requires tactile cues        Exercises      General Comments        Pertinent Vitals/Pain Pain Assessment: Faces Faces Pain Scale: No hurt    Home Living  Prior Function            PT Goals (current goals can now be found in the care plan section) Acute Rehab PT Goals PT Goal Formulation: With patient Time For Goal Achievement: 09/22/17 Potential to Achieve Goals:  Good Progress towards PT goals: Progressing toward goals    Frequency    Min 2X/week      PT Plan Current plan remains appropriate;Frequency needs to be updated    Co-evaluation              AM-PAC PT "6 Clicks" Daily Activity  Outcome Measure  Difficulty turning over in bed (including adjusting bedclothes, sheets and blankets)?: Unable Difficulty moving from lying on back to sitting on the side of the bed? : Unable Difficulty sitting down on and standing up from a chair with arms (e.g., wheelchair, bedside commode, etc,.)?: Unable Help needed moving to and from a bed to chair (including a wheelchair)?: A Little Help needed walking in hospital room?: A Lot Help needed climbing 3-5 steps with a railing? : A Lot 6 Click Score: 10    End of Session Equipment Utilized During Treatment: Gait belt Activity Tolerance: Patient limited by fatigue;Patient limited by lethargy Patient left: in bed;with call bell/phone within reach;with bed alarm set;Other (comment)(seizure precaution pads in place) Nurse Communication: Mobility status PT Visit Diagnosis: Unsteadiness on feet (R26.81);Difficulty in walking, not elsewhere classified (R26.2)     Time: 4536-4680 PT Time Calculation (min) (ACUTE ONLY): 17 min  Charges:  $Therapeutic Activity: 8-22 mins                    G Codes:       [place], [place], [place] 09/17/2017, 10:14 AM

## 2017-09-17 NOTE — Progress Notes (Signed)
PROGRESS NOTE    Brad Turner  XFG:[id number] DOB: [birth year] DOA: 09/14/2017 PCP: Haywood Pao, MD   Brief Narrative:  Brad Turner is a 77 y.o. male with medical history significant for COPD Gold stage II with ongoing tobacco abuse, depression, daily alcohol use, history of Barrett's esophagus, hypertension, and remote history of stroke.  He was initially admitted on obtain by the Cardiology service in the context of 3 hours of chest pain with a troponin of 1.76 and subtle submillimeter ST elevations in the inferior leads.  He was taken emergently to the Cath Lab where he was found to have moderate nonobstructive CAD with other findings consistent with Takotsubo cardiomyopathy with an EF of 30-35% with grade 1 diastolic dysfunction.  Since admission patient has developed acute delirium as well as Hyponatremia.  hiazide diuretic was discontinued and he was placed on 1 L fluid restriction with improvement of sodium from 122-130.  Additional history was obtained by rounding Cardiologist who determined that patient had been drinking large volumes of water at home.  Apparently he had verbalized to his pulmonologist during recent visit of dizziness and weakness upon standing and was felt he was volume depleted so therefore was instructed to increase water intake. TRH was consulted to assist with Management of EtOH withdrawal, Acute Delirium and Hyponatremia.   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 TSH was 11.0 -Delirium Precautions  Acute Hyponatremia/Mild Acute Kidney Injury -Hyponatremia reflective of recent thiazide diuretic use as well as increased free water at home as reported by patient; regular EtOH use also contributory ? Beer Potomania  -Thiazide diuretics stopped by Cardiology with institution of 1000 cc/24 hr fluid restriction with improvement in sodium from 122-130.  Continue fluid restriction; Na+ now 129 -Patient on Cymbalta which can cause hyponatremia/SIADH-may need to consider dose reduction if felt to be culprit -Obtained urinary sodium and osmolality as well as serum osmolality; Urinary Sodium was 54, Urinary Osmolals was 594, -TSH was 3.074 -Cardiology wishes to avoid volume resuscitation noting low EF secondary to stress cardiomyopathy (Takotsubo) -Discussed with Cardiology and we will hold ARB in the context of acute kidney injury especially after receiving IV contrast for catheterization-follow labs-cardiology will resume ARB once renal function improved -BUN/Cr went from 19/1.52 -> 20/1.25 -Oral intake of fluids over the past 24 hours was +120 mL -Continue to Hold Irbesartan 150 mg po Daily   Alcohol Withdrawal with DT's, Visual Hallucinations, and Tremors  -Patient admits to 2 glasses (unknown actual volume) of vodka daily plus triple sec; patient apparently told Cardiology PA he was also drinking wine along with vodka at times -Continue med-surg CIWA protocol with combo scheduled and prn Ativan noting patient ineligible for scheduled Ativan if CIWA score less than 5 -C/w Diazepam 2 mg every 8 hours -Encourage patient to mobilize in hall with assistance as well as out of bed to chair at least every shift -CIWA's been ranging from 0-5 -If worsening may  need Precedex gtt -C/w Seizure Precautions   COPD (chronic obstructive pulmonary disease)  GOLD stage II/ongoing tobacco abuse -Currently stable without exacerbation symptoms or wheezing -Continue Dulera -Pulmonologist has repeatedly discussed the need for tobacco cessation but the patient states he is not interested -Patient does have pulmonary nodules but recent CT of the chest December 2018 without any concerning features for malignancy therefore doubt bronchogenic carcinoma as etiology to patient's hyponatremia  Depression -On Cymbalta prior to admission -Cymbalta can contribute to hyponatremia/SIADH so consideration may need to be given to dose adjustment and eventual tapering with utilization of alternate drug  History of CVA (cerebrovascular accident) -No focal neurological deficits concerning for possible CVA as etiology to patient's delirium  Coronary Atherosclerosis -Moderate nonobstructive disease with cardiology presiding over medical management  Essential Hypertension, benign -Management per Cardiology  Takotsubo Cardiomyopathy -EF 30-35% and as discussed above ARB placed on hold secondary to mild AK I post-cath and with initiation of fluid restriction for hyponatremia -Management per Cardiology  History of Barrett's Esophagus -Patient is reporting significant anorexia and states he has lost weight although in review of medical record weight in December was 136 pounds, weight at presentation 135 pounds and current weight 143 pounds -Last EGD was in March 2017 and revealed irregular mucosa in the esophagus with erythematous mucosa in the gastric body with interval healing of previously documented gastric ulcer and gastritis with normal duodenal bulb.  Subsequent pathology benign for malignancy or H pylori -On twice daily Prilosec at home -Once delirium improved may benefit from nutrition consultation prior to discharge -Symptoms of GI bleeding reported since admission  Abnormal LFT's -2/2 to EtOH -Continue to Monitor and Repeat CMP in AM   DVT  prophylaxis: Heparin 5,000 units sq q8h  Code Status: FULL CODE Family Communication: None Disposition Plan: SNF at D/C per primary   Consultants:   [facility]  Cardiology (Primary)    Procedures:    Antimicrobials:  Anti-infectives (From admission, onward)   None     Subjective: Seen and examined at bedside and was still confused and thought he was "[place]" and wanted to leave. No CP or SOB  Objective: Vitals:   09/17/17 0859 09/17/17 0901 09/17/17 0909 09/17/17 1415  BP:   114/82 126/79  Pulse: 80  71 66  Resp: 16  17 16   Temp:   97.6 F (36.4 C) 97.8 F (36.6 C)  TempSrc:   Axillary Oral  SpO2: 99% 99% 99% 99%  Weight:      Height:        Intake/Output Summary (Last 24 hours) at 09/17/2017 1928 Last data filed at 09/17/2017 0900 Gross per 24 hour  Intake 370 ml  Output 400 ml  Net -30 ml   Filed Weights   09/15/17 0514 09/16/17 9381 09/17/17 0609  Weight: 63.9 kg (140 lb 14 oz) 65 kg (143 lb 4.8 oz) 65.2 kg (143 lb 11.8 oz)   Examination: Physical Exam:  Constitutional: WN/WD Caucasian male who is NAD but is still confused  Eyes: Lids and conjunctivae normal, sclerae anicteric  ENMT: External Ears, Nose appear normal. Grossly normal hearing. Mucous membranes are moist.  Neck: Appears normal, supple, no cervical masses, normal ROM, no appreciable thyromegaly, no JVD Respiratory: Diminished to auscultation bilaterally, no wheezing, rales, rhonchi or crackles. Normal respiratory effort and patient is not tachypenic. No accessory muscle use.  Cardiovascular: RRR, no murmurs / rubs / gallops. S1 and S2 auscultated. No extremity edema. Abdomen:  Soft, non-tender, non-distended,. No masses palpated. No appreciable hepatosplenomegaly. Bowel sounds positive x4.  GU: Deferred. Musculoskeletal: No clubbing / cyanosis of digits/nails. No joint deformity upper and lower extremities Skin: No rashes, lesions, ulcers on a limited skin evaluation.  No induration; Warm and dry.  Neurologic: CN 2-12 grossly intact with no focal deficits. Romberg sign cerebellar reflexes not assessed.  Psychiatric: Impaired judgment and insight. Awake but not oriented. Normal mood and appropriate affect.   Data Reviewed: I have personally reviewed following labs and imaging studies  CBC: Recent Labs  Lab 09/14/17 2344 09/15/17 0015 09/16/17 0917 09/17/17 0857  WBC 6.0  --  8.0 8.9  NEUTROABS  --   --  5.7 5.7  HGB 11.8* 13.6 10.6* 11.0*  HCT 32.7* 40.0 29.7* 31.6*  MCV 96.5  --  96.7 99.1  PLT 300  --  272 546   Basic Metabolic Panel: Recent Labs  Lab 09/15/17 1405 09/15/17 2100 09/16/17 0418 09/16/17 0917 09/17/17 0857  NA 122* 124* 130* 127* 129*  K 3.3* 3.3* 4.1 3.7 3.9  CL 85* 90* 94* 94* 95*  CO2 22 22 24 23 23   GLUCOSE 128* 101* 80 83 65  BUN 15 19 19 20 20   CREATININE 1.22 1.51* 1.52* 1.46* 1.25*  CALCIUM 8.5* 8.2* 8.7* 8.6* 8.6*  MG  --   --   --   --  1.0*  PHOS  --   --   --   --  2.9   GFR: Estimated Creatinine Clearance: 45.1 mL/min (A) (by C-G formula based on SCr of 1.25 mg/dL (H)). Liver Function Tests: Recent Labs  Lab 09/17/17 0857  AST 74*  ALT 37  ALKPHOS 46  BILITOT 1.1  PROT 6.2*  ALBUMIN 3.3*   No results for input(s): LIPASE, AMYLASE in the last 168 hours. No results for input(s): AMMONIA in the last 168 hours. Coagulation Profile: No results for input(s): INR, PROTIME in the last 168 hours. Cardiac Enzymes: Recent Labs  Lab 09/14/17 2344 09/15/17 0218  TROPONINI 1.35* 3.46*   BNP (last 3 results) No results for input(s): PROBNP in the last 8760 hours. HbA1C: No results for input(s): HGBA1C in the last 72 hours. CBG: No results for input(s): GLUCAP in the last 168 hours. Lipid Profile: Recent Labs    09/14/17 2354  CHOL 131  HDL 84  LDLCALC 38  TRIG 43  CHOLHDL 1.6   Thyroid Function Tests: Recent Labs    09/16/17 0907  TSH 3.074   Anemia Panel: No results for input(s):  VITAMINB12, FOLATE, FERRITIN, TIBC, IRON, RETICCTPCT in the last 72 hours. Sepsis Labs: Recent Labs  Lab 09/15/17 0016  LATICACIDVEN 1.77    No results found for this or any previous visit (from the past 240 hour(s)).   Radiology Studies: No results found.  Scheduled Meds: . atorvastatin  80 mg Oral QHS  . clopidogrel  75 mg Oral Daily  . diazepam  2 mg Oral Q8H  . DULoxetine  60 mg Oral Daily  . fenofibrate  160 mg Oral Daily  . folic acid  1 mg Oral Daily  . heparin  5,000 Units Subcutaneous Q8H  . HYDROcodone-acetaminophen  1 tablet Oral BID  . LORazepam  0-4 mg Intravenous Q6H   Followed by  . [START ON 09/18/2017] LORazepam  0-4 mg Intravenous Q12H  . metoprolol succinate  50 mg Oral Daily  . mirabegron ER  50 mg Oral Daily  . mometasone-formoterol  2 puff Inhalation BID  .  multivitamin with minerals  1 tablet Oral Daily  . pantoprazole  40 mg Oral Daily  . sodium chloride flush  3 mL Intravenous Q12H  . thiamine  100 mg Oral Daily   Or  . thiamine  100 mg Intravenous Daily   Continuous Infusions: . sodium chloride 20 mL (09/15/17 0002)    LOS: 2 days   Kerney Elbe, DO [facility] Pager [phone]  If 7PM-7AM, please contact night-coverage [email or web address] Password [facility] 09/17/2017, 7:28 PM

## 2017-09-18 LAB — CBC WITH DIFFERENTIAL/PLATELET
BASOS ABS: 0 10*3/uL (ref 0.0–0.1)
BASOS PCT: 0 %
EOS ABS: 0 10*3/uL (ref 0.0–0.7)
Eosinophils Relative: 1 %
HCT: 32.5 % — ABNORMAL LOW (ref 39.0–52.0)
Hemoglobin: 11.1 g/dL — ABNORMAL LOW (ref 13.0–17.0)
Lymphocytes Relative: 28 %
Lymphs Abs: 1.9 10*3/uL (ref 0.7–4.0)
MCH: 34.2 pg — ABNORMAL HIGH (ref 26.0–34.0)
MCHC: 34.2 g/dL (ref 30.0–36.0)
MCV: 100 fL (ref 78.0–100.0)
MONO ABS: 0.6 10*3/uL (ref 0.1–1.0)
Monocytes Relative: 9 %
Neutro Abs: 4.3 10*3/uL (ref 1.7–7.7)
Neutrophils Relative %: 62 %
PLATELETS: 308 10*3/uL (ref 150–400)
RBC: 3.25 MIL/uL — ABNORMAL LOW (ref 4.22–5.81)
RDW: 13.8 % (ref 11.5–15.5)
WBC: 6.8 10*3/uL (ref 4.0–10.5)

## 2017-09-18 LAB — URINE CULTURE

## 2017-09-18 LAB — COMPREHENSIVE METABOLIC PANEL
ALT: 35 U/L (ref 17–63)
AST: 61 U/L — AB (ref 15–41)
Albumin: 3.4 g/dL — ABNORMAL LOW (ref 3.5–5.0)
Alkaline Phosphatase: 43 U/L (ref 38–126)
Anion gap: 12 (ref 5–15)
BUN: 24 mg/dL — AB (ref 6–20)
CO2: 23 mmol/L (ref 22–32)
Calcium: 8.8 mg/dL — ABNORMAL LOW (ref 8.9–10.3)
Chloride: 96 mmol/L — ABNORMAL LOW (ref 101–111)
Creatinine, Ser: 1.39 mg/dL — ABNORMAL HIGH (ref 0.61–1.24)
GFR calc Af Amer: 56 mL/min — ABNORMAL LOW (ref >60)
GFR, EST NON AFRICAN AMERICAN: 48 mL/min — AB (ref >60)
Glucose, Bld: 66 mg/dL (ref 65–99)
POTASSIUM: 3.9 mmol/L (ref 3.5–5.1)
SODIUM: 131 mmol/L — AB (ref 135–145)
Total Bilirubin: 1.2 mg/dL (ref 0.3–1.2)
Total Protein: 6.4 g/dL — ABNORMAL LOW (ref 6.5–8.1)

## 2017-09-18 LAB — MAGNESIUM: MAGNESIUM: 1.2 mg/dL — AB (ref 1.7–2.4)

## 2017-09-18 LAB — PHOSPHORUS: PHOSPHORUS: 4 mg/dL (ref 2.5–4.6)

## 2017-09-18 MED ORDER — THIAMINE HCL 100 MG/ML IJ SOLN: 100.0000 mg | Freq: Once | INTRAMUSCULAR | Status: DC

## 2017-09-18 MED ORDER — METOPROLOL SUCCINATE ER 50 MG PO TB24: 50.0000 mg | ORAL_TABLET | Freq: Every day | ORAL | 3 refills | Status: AC

## 2017-09-18 MED ORDER — CHLORDIAZEPOXIDE HCL 25 MG PO CAPS: 25.0000 mg | ORAL_CAPSULE | Freq: Every day | ORAL | Status: DC

## 2017-09-18 MED ORDER — CHLORDIAZEPOXIDE HCL 25 MG PO CAPS
25.0000 mg | ORAL_CAPSULE | Freq: Once | ORAL | Status: AC
  Filled 2017-09-18: qty 1

## 2017-09-18 MED ORDER — ASPIRIN EC 81 MG PO TBEC
81.0000 mg | DELAYED_RELEASE_TABLET | Freq: Every day | ORAL | Status: DC
  Administered 2017-09-18: 81 mg via ORAL
  Filled 2017-09-18: qty 1

## 2017-09-18 MED ORDER — THIAMINE HCL 100 MG PO TABS: 100.0000 mg | ORAL_TABLET | Freq: Every day | ORAL | 0 refills | Status: DC

## 2017-09-18 MED ORDER — HYDROXYZINE HCL 25 MG PO TABS: 25.0000 mg | ORAL_TABLET | Freq: Four times a day (QID) | ORAL | Status: DC | PRN

## 2017-09-18 MED ORDER — CHLORDIAZEPOXIDE HCL 25 MG PO CAPS: 25.0000 mg | ORAL_CAPSULE | Freq: Two times a day (BID) | ORAL | Status: DC

## 2017-09-18 MED ORDER — LOPERAMIDE HCL 2 MG PO CAPS: 2.0000 mg | ORAL_CAPSULE | ORAL | Status: DC | PRN

## 2017-09-18 MED ORDER — MAGNESIUM SULFATE 2 GM/50ML IV SOLN: 2.0000 g | Freq: Once | INTRAVENOUS | Status: DC

## 2017-09-18 MED ORDER — ADULT MULTIVITAMIN W/MINERALS CH: 1.0000 | ORAL_TABLET | Freq: Every day | ORAL | 0 refills | Status: DC

## 2017-09-18 MED ORDER — ONDANSETRON 4 MG PO TBDP
4.0000 mg | ORAL_TABLET | Freq: Four times a day (QID) | ORAL | Status: DC | PRN
  Filled 2017-09-18: qty 1

## 2017-09-18 MED ORDER — HYDROXYZINE HCL 25 MG PO TABS: 25.0000 mg | ORAL_TABLET | Freq: Four times a day (QID) | ORAL | 0 refills | Status: DC | PRN

## 2017-09-18 MED ORDER — VITAMIN B-1 100 MG PO TABS: 100.0000 mg | ORAL_TABLET | Freq: Every day | ORAL | Status: DC

## 2017-09-18 MED ORDER — ASPIRIN 81 MG PO TBEC: 81.0000 mg | DELAYED_RELEASE_TABLET | Freq: Every day | ORAL | 4 refills | Status: DC

## 2017-09-18 MED ORDER — CHLORDIAZEPOXIDE HCL 25 MG PO CAPS
25.0000 mg | ORAL_CAPSULE | Freq: Four times a day (QID) | ORAL | Status: DC
  Administered 2017-09-18: 25 mg via ORAL

## 2017-09-18 MED ORDER — FOLIC ACID 1 MG PO TABS: 1.0000 mg | ORAL_TABLET | Freq: Every day | ORAL | 0 refills | Status: AC

## 2017-09-18 MED ORDER — CHLORDIAZEPOXIDE HCL 25 MG PO CAPS: ORAL_CAPSULE | ORAL | 0 refills | Status: DC

## 2017-09-18 MED ORDER — DIAZEPAM 2 MG PO TABS: 2.0000 mg | ORAL_TABLET | Freq: Two times a day (BID) | ORAL | Status: DC | PRN

## 2017-09-18 MED ORDER — MAGNESIUM SULFATE 4 GM/100ML IV SOLN
4.0000 g | Freq: Once | INTRAVENOUS | Status: AC
  Administered 2017-09-18: 4 g via INTRAVENOUS
  Filled 2017-09-18: qty 100

## 2017-09-18 MED ORDER — LOPERAMIDE HCL 2 MG PO CAPS: 2.0000 mg | ORAL_CAPSULE | ORAL | 0 refills | Status: DC | PRN

## 2017-09-18 MED ORDER — ADULT MULTIVITAMIN W/MINERALS CH: 1.0000 | ORAL_TABLET | Freq: Every day | ORAL | Status: DC

## 2017-09-18 MED ORDER — CHLORDIAZEPOXIDE HCL 25 MG PO CAPS: 25.0000 mg | ORAL_CAPSULE | Freq: Three times a day (TID) | ORAL | Status: DC

## 2017-09-18 MED ORDER — CHLORDIAZEPOXIDE HCL 25 MG PO CAPS: 25.0000 mg | ORAL_CAPSULE | Freq: Four times a day (QID) | ORAL | Status: DC | PRN

## 2017-09-18 NOTE — Progress Notes (Signed)
PROGRESS NOTE    Brad Turner  GLO:[id number] DOB: Mar 07, 1941 DOA: 09/14/2017 PCP: Haywood Pao, MD   Brief Narrative:  Brad Turner is a 77 y.o. male with medical history significant for COPD Gold stage II with ongoing tobacco abuse, depression, daily alcohol use, history of Barrett's esophagus, hypertension, and remote history of stroke.  He was initially admitted on obtain by the Cardiology service in the context of 3 hours of chest pain with a troponin of 1.76 and subtle submillimeter ST elevations in the inferior leads.  He was taken emergently to the Cath Lab where he was found to have moderate nonobstructive CAD with other findings consistent with Takotsubo cardiomyopathy with an EF of 30-35% with grade 1 diastolic dysfunction.  Since admission patient has developed acute delirium as well as Hyponatremia.  hiazide diuretic was discontinued and he was placed on 1 L fluid restriction with improvement of sodium from 122-130.  Additional history was obtained by rounding Cardiologist who determined that patient had been drinking large volumes of water at home.  Apparently he had verbalized to his pulmonologist during recent visit of dizziness and weakness upon standing and was felt he was volume depleted so therefore was instructed to increase water intake. TRH was consulted to assist with Management of EtOH withdrawal, Acute Delirium and Hyponatremia.   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 Hb/Hct was 11.1/32.5 -TSH was 3.074 -Delirium Precautions -Patient was alert and oriented x3 -PT recommending SNF but patient declining   Acute Hyponatremia/Mild Acute Kidney Injury -Hyponatremia reflective of recent thiazide diuretic use as well as increased free water at home as reported by patient; regular EtOH use also contributory ? Beer Potomania  -Thiazide diuretics stopped by Cardiology with institution of 1000 cc/24 hr fluid restriction with improvement in sodium from 122-130.  Continue fluid restriction; Na+ now 131 -Patient on Cymbalta which can cause hyponatremia/SIADH-may need to consider dose reduction if felt to be culprit -Obtained urinary sodium and osmolality as well as serum osmolality; Urinary Sodium was 54, Urinary Osmolals was 594, -TSH was 3.074 -Cardiology wishes to avoid volume resuscitation noting low EF secondary to stress cardiomyopathy (Takotsubo) -Discussed with Cardiology and we will hold ARB in the context of acute kidney injury especially after receiving IV contrast for catheterization-follow labs-cardiology will resume ARB once renal function improved -BUN/Cr went from 19/1.52 -> 20/1.25 -> 24/1.39 -Patient is +1.198 L since admission  -Continue to Hold Irbesartan 150 mg po Daily AND HCTZ -Patient to be D/C'd by Primary and recommend PCP follow up and repeat CMP as an outpatient   Alcohol Withdrawal with DT's, Visual Hallucinations, and Tremors, significantly improved  -Patient admitted to 2 glasses (unknown actual volume) of vodka daily plus triple sec; patient apparently told Cardiology PA he was also drinking wine along with vodka at times -Continued med-surg CIWA protocol with combo scheduled and prn Ativan noting patient ineligible for scheduled  Ativan if CIWA score less than 5 -Changed  Diazepam 2 mg every 8 hours to Librium Taper of 25 mg po QID today, then 25 mg po TID tomorrow, 25 mg BID on 09/20/17, and 25 mg po x1 on 09/21/17 as patient was going to be D/C'd Home by Primary as he has improved significantly mentation wise  -Encourage patient to mobilize in hall with assistance as well as out of bed to chair at least every shift; PT Recommending SNF but patient actively declining and 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 -CIWA's 0 for the last 18 hours -C/w Seizure Precautions  -C/w Folic Acid, MVI, and Thiamine as an outpatient  -Alcohol Cessation Counseling strongly advised   -Patient advised to go to SNF but actively declined  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/ongoing Tobacco Abuse -Currently stable without exacerbation symptoms or wheezing -[place] has repeatedly discussed the need for tobacco cessation but the patient states he is not interested -Patient does have pulmonary nodules but recent CT of the chest December 2018 without any concerning features for malignancy therefore doubt bronchogenic carcinoma as etiology to patient's hyponatremia -Follow up with Pulmonology as an outpatient   Depression -On Cymbalta prior to admission -Cymbalta can contribute to hyponatremia/SIADH so consideration may need to be given to dose adjustment and eventual tapering with utilization of alternate drug -Primary to Adjust   History of CVA (cerebrovascular accident) -No focal neurological deficits concerning for possible CVA as etiology to patient's delirium  Coronary Atherosclerosis -Moderate nonobstructive disease with Cardiology presiding over medical management -Follow up with Cardiology   Essential Hypertension, benign -Management per Cardiology  Takotsubo Cardiomyopathy -EF 30-35% and as discussed above ARB placed on hold secondary to mild AK I post-cath and with initiation of fluid restriction for  hyponatremia -Management per Cardiology and follow up with them as an outpatient   History of Barrett's Esophagus -Patient is reporting significant anorexia and states he has lost weight although in review of medical record weight in December was 136 pounds, weight at presentation 135 pounds and current weight 143 pounds -Last EGD was in March 2017 and revealed irregular mucosa in the esophagus with erythematous mucosa in the gastric body with interval healing of previously documented gastric ulcer and gastritis with normal duodenal bulb.  Subsequent pathology benign for malignancy or H pylori -On twice daily Prilosec at home -Follow up with Nutritionist as an outpatient  -Symptoms of GI bleeding reported since admission  Abnormal LFT's -2/2 to EtOH -AST went from 74 -> 61 -Continue to Monitor and Repeat CMP as an outpatient  Hypomagnesemia -Patient's Mag Level was 1.2 this AM  -Replete with IV Mag Sulfate 4 grams -Continue to Monitor and Replete as Necessary' -Repeat Mag Level as an outpatient   DVT prophylaxis: Heparin 5,000 units sq q8h  Code Status: FULL CODE Family Communication: None Disposition Plan: Patient refusing SNF; Being discharged by Primary  Consultants:   [facility]  Cardiology (Primary)    Procedures:  Cardiac Catheterization  Prox LAD lesion is 25% stenosed.  Ost 1st Mrg to 1st Mrg lesion is 60% stenosed.  Ost 2nd Mrg to 2nd Mrg lesion is 65% stenosed.  There is moderate left ventricular systolic dysfunction.  LV end diastolic pressure is mildly elevated.  The left ventricular ejection fraction is 35-45% by visual estimate.  Dist LAD lesion is 60% stenosed.   1. Modest nonobstructive CAD involving the distal LAD, OM1, and OM2. 2. Moderate LV dysfunction  with typical pattern of Takotsubo cardiomyopathy 3. Mildly elevated LVEDP- 25 mm Hg   ECHOCARDIOGRAM ------------------------------------------------------------------- Study Conclusions  -  Left ventricle: The cavity size was normal. Wall thickness was   normal. Systolic function was moderately to severely reduced. The   estimated ejection fraction was in the range of 30% to 35%.   Dyskinesis of the apical myocardium. Akinesis of the basal-mid   anteroseptal, anterior, inferior, and inferoseptal myocardium;   consistent with infarction in the distribution of the left   anterior descending coronary artery. Alternatively, this may   represent takotsubo cardiomyopathy. Doppler parameters are   consistent with abnormal left ventricular relaxation (grade 1   diastolic dysfunction). No evidence of thrombus. - Aortic valve: There was trivial regurgitation.   Antimicrobials:  Anti-infectives (From admission, onward)   None     Subjective: Seen and examined at was alert and oriented x4. 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 No CP or SOB. Wanting to go home and still refusing SNF.   Objective: Vitals:   09/18/17 0514 09/18/17 0848 09/18/17 1024 09/18/17 1431  BP: (!) 106/57  92/67 (!) 79/52  Pulse: 61  67 61  Resp: 18   14  Temp: 97.6 F (36.4 C)   98.9 F (37.2 C)  TempSrc: Oral   Axillary  SpO2: 100% 97%  97%  Weight:      Height:        Intake/Output Summary (Last 24 hours) at 09/18/2017 1627 Last data filed at 09/18/2017 1508 Gross per 24 hour  Intake 705 ml  Output 900 ml  Net -195 ml   Filed Weights   09/15/17 0514 09/16/17 1610 09/17/17 0609  Weight: 63.9 kg (140 lb 14 oz) 65 kg (143 lb 4.8 oz) 65.2 kg (143 lb 11.8 oz)   Examination: Physical Exam:  Constitutional: Caucasian male in NAD who is awake and alert and in NAD Eyes: Sclerae anicteric. Lids normal ENMT: External Ears and nose appear normal. MMM Neck: Supple with no JVD Respiratory: CTAB; Unlabored Breathing Cardiovascular: RRR; No LE Edema Abdomen: Soft, NT, ND. Bowel sounds present GU: Deferred Musculoskeletal: No contractures; No cyanosis Skin: Warm and Dry. No  rashes or lesions on a limited skin eval Neurologic: CN 2-12 grossly intact. No appreciable focal deficits Psychiatric: Normal mood and affect. Intact judgement and insight  Data Reviewed: I have personally reviewed following labs and imaging studies  CBC: Recent Labs  Lab 09/14/17 2344 09/15/17 0015 09/16/17 0917 09/17/17 0857 09/18/17 0836  WBC 6.0  --  8.0 8.9 6.8  NEUTROABS  --   --  5.7 5.7 4.3  HGB 11.8* 13.6 10.6* 11.0* 11.1*  HCT 32.7* 40.0 29.7* 31.6* 32.5*  MCV 96.5  --  96.7 99.1 100.0  PLT 300  --  272 272 960   Basic Metabolic Panel: Recent Labs  Lab 09/15/17 2100 09/16/17 0418 09/16/17 0917 09/17/17 0857 09/18/17 0836  NA 124* 130* 127* 129* 131*  K 3.3* 4.1 3.7 3.9 3.9  CL 90* 94* 94* 95* 96*  CO2 22 24 23 23 23   GLUCOSE 101* 80 83 65 66  BUN 19 19 20 20  24*  CREATININE 1.51* 1.52* 1.46* 1.25* 1.39*  CALCIUM 8.2* 8.7* 8.6* 8.6* 8.8*  MG  --   --   --  1.0* 1.2*  PHOS  --   --   --  2.9 4.0   GFR: Estimated Creatinine Clearance: 40.6 mL/min (A) (by C-G formula based on SCr of  1.39 mg/dL (H)). Liver Function Tests: Recent Labs  Lab 09/17/17 0857 09/18/17 0836  AST 74* 61*  ALT 37 35  ALKPHOS 46 43  BILITOT 1.1 1.2  PROT 6.2* 6.4*  ALBUMIN 3.3* 3.4*   No results for input(s): LIPASE, AMYLASE in the last 168 hours. No results for input(s): AMMONIA in the last 168 hours. Coagulation Profile: No results for input(s): INR, PROTIME in the last 168 hours. Cardiac Enzymes: Recent Labs  Lab 09/14/17 2344 09/15/17 0218  TROPONINI 1.35* 3.46*   BNP (last 3 results) No results for input(s): PROBNP in the last 8760 hours. HbA1C: No results for input(s): HGBA1C in the last 72 hours. CBG: No results for input(s): GLUCAP in the last 168 hours. Lipid Profile: No results for input(s): CHOL, HDL, LDLCALC, TRIG, CHOLHDL, LDLDIRECT in the last 72 hours. Thyroid Function Tests: Recent Labs    09/16/17 0907  TSH 3.074   Anemia Panel: No results  for input(s): VITAMINB12, FOLATE, FERRITIN, TIBC, IRON, RETICCTPCT in the last 72 hours. Sepsis Labs: Recent Labs  Lab 09/15/17 0016  LATICACIDVEN 1.77    Recent Results (from the past 240 hour(s))  Culture, Urine     Status: Abnormal   Collection Time: 09/16/17 10:31 PM  Result Value Ref Range Status   Specimen Description URINE, RANDOM  Final   Special Requests NONE  Final   Culture <10,000 COLONIES/mL INSIGNIFICANT GROWTH (A)  Final   Report Status 09/18/2017 FINAL  Final    Radiology Studies: No results found.  Scheduled Meds: . aspirin EC  81 mg Oral Daily  . atorvastatin  80 mg Oral QHS  . chlordiazePOXIDE  25 mg Oral QID   Followed by  . [START ON 09/19/2017] chlordiazePOXIDE  25 mg Oral TID   Followed by  . [START ON 09/20/2017] chlordiazePOXIDE  25 mg Oral BID   Followed by  . [START ON 09/21/2017] chlordiazePOXIDE  25 mg Oral Daily  . clopidogrel  75 mg Oral Daily  . DULoxetine  60 mg Oral Daily  . fenofibrate  160 mg Oral Daily  . folic acid  1 mg Oral Daily  . heparin  5,000 Units Subcutaneous Q8H  . HYDROcodone-acetaminophen  1 tablet Oral BID  . LORazepam  0-4 mg Intravenous Q12H  . metoprolol succinate  50 mg Oral Daily  . mirabegron ER  50 mg Oral Daily  . mometasone-formoterol  2 puff Inhalation BID  . multivitamin with minerals  1 tablet Oral Daily  . multivitamin with minerals  1 tablet Oral Daily  . pantoprazole  40 mg Oral Daily  . sodium chloride flush  3 mL Intravenous Q12H  . thiamine  100 mg Oral Daily   Or  . thiamine  100 mg Intravenous Daily  . thiamine  100 mg Intramuscular Once  . [START ON 09/19/2017] thiamine  100 mg Oral Daily   Continuous Infusions: . sodium chloride 20 mL (09/15/17 0002)    LOS: 3 days   Kerney Elbe, DO [facility] Pager [phone]  If 7PM-7AM, please contact night-coverage [email or web address] Password [facility] 09/18/2017, 4:27 PM

## 2017-09-18 NOTE — Progress Notes (Signed)
Patient and family received discharge information and acknowledged understanding of it. Patient received prescriptions. Patient IV was removed.

## 2017-09-18 NOTE — Progress Notes (Addendum)
Progress Note  Patient Name: Brad Turner Date of Encounter: 09/18/2017  Primary Cardiologist: Dr. Marlou Porch  Subjective   Pt remains on [place]. Improved today. 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 Seems to be more oriented. Yesterday was confabulating.   No CP, no SOB.   Inpatient Medications    Scheduled Meds: . atorvastatin  80 mg Oral QHS  . clopidogrel  75 mg Oral Daily  . diazepam  2 mg Oral Q8H  . DULoxetine  60 mg Oral Daily  . fenofibrate  160 mg Oral Daily  . folic acid  1 mg Oral Daily  . heparin  5,000 Units Subcutaneous Q8H  . HYDROcodone-acetaminophen  1 tablet Oral BID  . LORazepam  0-4 mg Intravenous Q6H   Followed by  . LORazepam  0-4 mg Intravenous Q12H  . metoprolol succinate  50 mg Oral Daily  . mirabegron ER  50 mg Oral Daily  . mometasone-formoterol  2 puff Inhalation BID  . multivitamin with minerals  1 tablet Oral Daily  . pantoprazole  40 mg Oral Daily  . sodium chloride flush  3 mL Intravenous Q12H  . thiamine  100 mg Oral Daily   Or  . thiamine  100 mg Intravenous Daily   Continuous Infusions: . sodium chloride 20 mL (09/15/17 0002)   PRN Meds: acetaminophen, LORazepam **OR** LORazepam, nitroGLYCERIN, ondansetron (ZOFRAN) IV, sodium chloride flush   Vital Signs    Vitals:   09/17/17 1415 09/17/17 2036 09/17/17 2105 09/18/17 0514  BP: 126/79  133/83 (!) 106/57  Pulse: 66  65 61  Resp: 16  19 18   Temp: 97.8 F (36.6 C)  97.6 F (36.4 C) 97.6 F (36.4 C)  TempSrc: Oral  Oral Oral  SpO2: 99% 96% 100% 100%  Weight:      Height:        Intake/Output Summary (Last 24 hours) at 09/18/2017 0802 Last data filed at 09/18/2017 0000 Gross per 24 hour  Intake 360 ml  Output 700 ml  Net -340 ml   Filed Weights   09/15/17 0514 09/16/17 0632 09/17/17 0609  Weight: 140 lb 14 oz (63.9 kg) 143 lb 4.8 oz (65 kg) 143 lb 11.8 oz (65.2 kg)     Physical Exam   General: Well developed, well nourished, male appearing  confused. Head: Normocephalic, atraumatic.  Neck: Supple without bruits, JVD. Lungs:  Resp regular and unlabored, CTA. Heart: RRR, S1, S2, no S3, S4, or murmur; no rub. Abdomen: Soft, non-tender, non-distended with normoactive bowel sounds. No hepatomegaly. No rebound/guarding. No obvious abdominal masses. Extremities: No clubbing, cyanosis, noedema. Distal pedal pulses are 2+ bilaterally. Neuro: confused Psych: confused  Labs    Chemistry Recent Labs  Lab 09/16/17 0418 09/16/17 0917 09/17/17 0857  NA 130* 127* 129*  K 4.1 3.7 3.9  CL 94* 94* 95*  CO2 24 23 23   GLUCOSE 80 83 65  BUN 19 20 20   CREATININE 1.52* 1.46* 1.25*  CALCIUM 8.7* 8.6* 8.6*  PROT  --   --  6.2*  ALBUMIN  --   --  3.3*  AST  --   --  74*  ALT  --   --  37  ALKPHOS  --   --  46  BILITOT  --   --  1.1  GFRNONAA 43* 46* 55*  GFRAA 50* 53* >60  ANIONGAP 12 10 11      Hematology Recent Labs  Lab 09/14/17 2344 09/15/17 0015 09/16/17 3785  09/17/17 0857  WBC 6.0  --  8.0 8.9  RBC 3.39*  --  3.07* 3.19*  HGB 11.8* 13.6 10.6* 11.0*  HCT 32.7* 40.0 29.7* 31.6*  MCV 96.5  --  96.7 99.1  MCH 34.8*  --  34.5* 34.5*  MCHC 36.1*  --  35.7 34.8  RDW 13.8  --  14.0 14.0  PLT 300  --  272 272    Cardiac Enzymes Recent Labs  Lab 09/14/17 2344 09/15/17 0218  TROPONINI 1.35* 3.46*    Recent Labs  Lab 09/14/17 2341  TROPIPOC 1.76*     BNPNo results for input(s): BNP, PROBNP in the last 168 hours.   DDimer No results for input(s): DDIMER in the last 168 hours.   Radiology    No results found.   Telemetry    NSR - Personally Reviewed  ECG    NSR - Personally Reviewed   Cardiac Studies   Echo 09/15/17: Study Conclusions - Left ventricle: The cavity size was normal. Wall thickness was   normal. Systolic function was moderately to severely reduced. The   estimated ejection fraction was in the range of 30% to 35%.   Dyskinesis of the apical myocardium. Akinesis of the basal-mid    anteroseptal, anterior, inferior, and inferoseptal myocardium;   consistent with infarction in the distribution of the left   anterior descending coronary artery. Alternatively, this may   represent takotsubo cardiomyopathy. Doppler parameters are   consistent with abnormal left ventricular relaxation (grade 1   diastolic dysfunction). No evidence of thrombus. - Aortic valve: There was trivial regurgitation.   Left heart cath 09/15/17:  Prox LAD lesion is 25% stenosed.  Ost 1st Mrg to 1st Mrg lesion is 60% stenosed.  Ost 2nd Mrg to 2nd Mrg lesion is 65% stenosed.  There is moderate left ventricular systolic dysfunction.  LV end diastolic pressure is mildly elevated.  The left ventricular ejection fraction is 35-45% by visual estimate.  Dist LAD lesion is 60% stenosed.   1. Modest nonobstructive CAD involving the distal LAD, OM1, and OM2. 2. Moderate LV dysfunction with typical pattern of Takotsubo cardiomyopathy 3. Mildly elevated LVEDP- 25 mm Hg  Plan: medical management. Would avoid using right radial access in the future.   Diagnostic Diagram           Patient Profile     77 y.o. male admitted with non-ST elevation myocardial infarction with urgent cardiac catheterization revealing moderate nonobstructive CAD with ejection fraction in the 35-45% range with a typical pattern of stress-induced cardiomyopathy with hypertension hyperlipidemia COPD active smoker and prior pericarditis. Now with apparent ETOH withdrawal.   Assessment & Plan    1. Stress-induced cardiomyopathy/NSTEMI - heart cath with mild nonobstructive disease - medical management - Continue ASA and aggressive secondary prevention - Continue ASA and plavix for DAPT - EKG with deep inverted T waves in precordial leads consistent with takatsubo   - continue toprol - pt appears euvolemic on exam  2. Hyponatremia - resolving, now 131 (nearly baseline seen in 12/18 - management per IM, appreciate their  assistance.  - continue with water restriction - I would be fine with decreasing cymbalta if this is felt to help.  - holding irbesartan. Fluid restrict 1L.    3. ETOH withdrawal DT  - TRH assistance appreciated. CIWA  - Discussed with Dr. Alfredia Ferguson. Score now is 0. He will write for librium taper, thiamine, MVI and loperamide if diarrhea occurs. OK to go home. 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   -  Continue to hold ARB on DC because of hyponatremia and AKI -OK to give Bb, statin, plavix.  -No referral to cardiac rehab because with stress induced cardiomyopathy, he should return to normal in several days.  - set him up for follow up with me or APP in 2 weeks.   Candee Furbish, MD    Signed, Candee Furbish, MD

## 2017-09-18 NOTE — Progress Notes (Addendum)
3:30 CSW met with patient and son at bedside and explained recommendations for SNF to son. Patient and son still decline SNF. Son assisting patient in getting dressed to go home. CSW signing off.  2:00 pm CSW met with patient at bedside and completed SBIRT. Patient reports drinking 4-5 alcoholic drinks most nights of the week for several years. Patient denies drinking alcohol in the past week.  CSW also assessed for SNF placement. PT continues to strongly recommend SNF, indicating that patient is weak and unsteady on his feet, not safe for independent living. Patient continues to decline SNF, explaining that he has neighbors and friends who help him at home. 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 Patient appears alert and oriented, however this CSW with concern for patient's understanding of situation given recent delirium.   CSW and patient together called patient's son, Gerald Stabs, who is on the way to hospital to visit patient. CSW to meet again with patient and son and discuss possible SNF placement. CSW to support with disposition planning.  Estanislado Emms, [place]

## 2017-09-18 NOTE — Care Management Note (Signed)
Case Management Note  Patient Details  Name: Brad Turner MRN: [id number] Date of Birth: 23-Jul-1941  Subjective/Objective:  Pt presented for Chest Pain- ETOH Withdrawal. Pt has support of son and son states he will be staying with patient once he transitions home. Pt states he has a RW for DME use.                    Action/Plan: Pt declined SNF. CM did speak with pt/son and he has declined [facility] as well. CM did make pt aware that he can call PCP if he decides to get [facility] Services once he gets home. No further needs from CM at this time.   Expected Discharge Date:  09/18/17               Expected Discharge Plan:  Home/Self Care  In-House Referral:  Clinical Social Work  Discharge planning Services  CM Consult  Post Acute Care Choice:  NA Choice offered to:  NA  DME Arranged:  N/A DME Agency:  NA  HH Arranged:  Patient Refused [place] Agency:  NA  Status of Service:  Completed, signed off  If discussed at [employer] of Stay Meetings, dates discussed:    Additional Comments:  Bethena Roys, RN 09/18/2017, 3:55 PM

## 2017-09-18 NOTE — Discharge Summary (Signed)
Discharge Summary    Patient ID: Brad Turner,  MRN: [id number], DOB/AGE: Apr 27, 1941 77 y.o.  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 Ivp Dye [Iodinated Diagnostic Agents] Hives and Shortness Of Breath    reaction was when pt was 77 years old.  . Macrodantin [Nitrofurantoin] Other (See Comments)    Fever that lasted several months    Diagnostic Studies/Procedures    Cath 09/15/17  Prox LAD lesion is 25% stenosed.  Ost 1st Mrg to 1st Mrg lesion is 60% stenosed.  Ost 2nd Mrg to 2nd Mrg lesion is 65% stenosed.  There is moderate left ventricular systolic dysfunction.  LV end diastolic pressure is mildly elevated.  The left ventricular ejection fraction is 35-45% by visual estimate.  Dist LAD lesion is 60% stenosed. 1. Modest nonobstructive CAD involving the distal LAD, OM1, and OM2.  2. Moderate LV dysfunction with typical pattern of Takotsubo cardiomyopathy  3. Mildly elevated LVEDP- 25 mm Hg   Echo 09/15/17: Study Conclusions - Left ventricle: The cavity size was normal. Wall thickness was normal. Systolic function was moderately to severely reduced. The estimated ejection fraction was in the range of 30% to 35%. Dyskinesis of the apical myocardium. Akinesis of the basal-mid anteroseptal, anterior, inferior, and inferoseptal myocardium; consistent with infarction in the distribution of the left anterior descending coronary artery.  Alternatively, this may represent takotsubo cardiomyopathy. Doppler parameters are consistent with abnormal left ventricular relaxation (grade 1 diastolic dysfunction). No evidence of thrombus. - Aortic valve: There was trivial regurgitation.     History of Present Illness     Brad Turner is a 77 y.o. male with a history of CAD s/p multiple POBA (last in 2011 to distal OM2), HTN, HLD, COPD managed by pulmonary, tobacco use, alcohol abuse, and prior pericarditis.   He presented to the ED on 09/15/2017 with acute CP r/t mid back that was unrelieved by SL nitro. It had begun 3 hours prior to arrival. No other associated symptoms. He felt that it was similiar to his pericarditis pain. Troponin was elevated and EKG showed sub-mm ST elevations in inferior leads, so urgent catheterization was performed.    Hospital Course     Consultants: Internal Medicine for ETOH withdrawal  Stress-induced cardiomyopathy Pt admitted on 09/15/2017 with positive troponin and EKG with sub-mm ST elevations in II, III, and avf. LHC showed non-obstructive CAD with no culprit lesion. Ventriculogram highly consistent with takutsubo cardiomyopathy. His echo showed EF 30-35% with akinesis of basal-mid anteroseptal, anterior, inferior, and inferoseptal myocardium (consistent with takotsubo cardiomyopathy) and grade 1 DD. Medical management was initiated with plavix, Toprol XL, and atorvastatin. ACEI/ARB was held due to AKI post cath. He had previously been on valsartan, but will continue to hold for now. He did not require any diuresis. EKG on [place] of discharge shows deep inverted T-waves in precordial leads, consistent with takotsubo cardiomyopathy.  AKI Creatinine peaked at 1.5, decreased at discharge to 1.39. His potassium was 3.9 and he is  making good urine.  Recommend checking BMP at follow up.  Alcohol withdrawal/delirium He became increasingly confused throughout the [place] on 09/15/2017 so CIWA protocol was  initiated.  Confusion did not improve with sodium improvement (see below), so thought to be r/t ETOH withdrawal (he drinks 3 vodka drinks + wine daily). ETOH withdrawal was managed with CIWA protocol by TRH. Today, he is no longer confused and is ready to be discharged.  Hyponatremia Free water restriction to 1 L for hyponatremia as low as 121. Prior to admission, Dr. Leeanne Deed had instructed him to increase fluid intake and hold HCTZ and valsartan due to reported orthostasis, so this is thought to be the cause of his hyponatremia. His sodium was responsive to free water restriction and on [place] of discharge improved to 131. His HCTZ was held to help improve sodium levels.   PT evaluated patient during delirium and then again when heavily medicated for ETOH withdrawal and had recommended SNF. This morning, he is oriented and ambulatory without issue, so there is no further concern for SNF placement.   In summary, pt will be discharged to home on plavix, toprol XL, and atorvastatin. HCTZ and ARB will be DC'd for now given AKI and hyponatremia. Continue fluid restriction of 1L. Could consider decreasing Cymbalta to help with hyponatremia. He does not need cardiac rehab with stress-induced CM. TRH to prescribe librium taper, thiamine, MVI, and loperamide for ETOH withdrawal management.   Pt was seen and examined today by Dr. Marlou Porch and felt stable for discharge home.    _____________  Discharge Vitals Blood pressure 92/67, pulse 67, temperature 97.6 F (36.4 C), temperature source Oral, resp. rate 18, height 5\' 5"  (1.651 m), weight 143 lb 11.8 oz (65.2 kg), SpO2 97 %.  Filed Weights   09/15/17 0514 09/16/17 0632 09/17/17 0609  Weight: 140 lb 14 oz (63.9 kg) 143 lb 4.8 oz (65 kg) 143 lb 11.8 oz (65.2 kg)    Labs & Radiologic Studies    CBC Recent Labs    09/17/17 0857 09/18/17 0836  WBC 8.9 6.8  NEUTROABS 5.7 4.3  HGB 11.0* 11.1*  HCT 31.6* 32.5*  MCV 99.1 100.0  PLT 272 751   Basic  Metabolic Panel Recent Labs    09/17/17 0857 09/18/17 0836  NA 129* 131*  K 3.9 3.9  CL 95* 96*  CO2 23 23  GLUCOSE 65 66  BUN 20 24*  CREATININE 1.25* 1.39*  CALCIUM 8.6* 8.8*  MG 1.0* 1.2*  PHOS 2.9 4.0   Liver Function Tests Recent Labs    09/17/17 0857 09/18/17 0836  AST 74* 61*  ALT 37 35  ALKPHOS 46 43  BILITOT 1.1 1.2  PROT 6.2* 6.4*  ALBUMIN 3.3* 3.4*   No results for input(s): LIPASE, AMYLASE in the last 72 hours. Cardiac Enzymes No results for input(s): CKTOTAL, CKMB, CKMBINDEX, TROPONINI in the last 72 hours. BNP Invalid input(s): POCBNP D-Dimer No results for input(s): DDIMER in the last 72 hours. Hemoglobin A1C No results for input(s): HGBA1C in the last 72 hours. Fasting Lipid Panel No results for input(s): CHOL, HDL, LDLCALC, TRIG, CHOLHDL, LDLDIRECT in the last 72 hours. Thyroid Function Tests Recent Labs    09/16/17 0907  TSH 3.074   _____________  Dg Chest Portable 1 View  Result Date: 09/15/2017 CLINICAL DATA:  Weakness, LEFT chest pain. History of pericarditis with similar symptoms. EXAM: PORTABLE CHEST 1 VIEW COMPARISON:  CT chest August 07, 2017. FINDINGS: Cardiac silhouette is upper limits of  normal in size, mediastinal silhouette is nonsuspicious. Mildly calcified aortic knob. Mild hyperinflation. No pleural effusion or focal consolidation. No pneumothorax. Surgical clips LEFT neck compatible with thyroidectomy. Soft tissue planes and included osseous structures are nonsuspicious. Multiple EKG lines overlie the patient and may obscure subtle underlying pathology. IMPRESSION: Borderline cardiomegaly.  No acute pulmonary process. Aortic Atherosclerosis (ICD10-I70.0). Electronically Signed   By: Elon Alas M.D.   On: 09/15/2017 00:22   Disposition   Pt is being discharged home today in good condition.  Follow-up Plans & Appointments    Follow-up Information    Isaiah Serge, NP Follow up on 09/25/2017.   Specialties:   Cardiology, Radiology Why:  11:00 AM for hospital follow up and TCM Contact information: [place] [address] [phone]          Discharge Instructions    Diet - low sodium heart healthy   Complete by:  As directed    Discharge instructions   Complete by:  As directed    No driving for 2 days. No lifting over 5 lbs for 1 week. No sexual activity for 1 week. You may return to work in 1 week. Keep procedure site clean & dry. If you notice increased pain, swelling, bleeding or pus, call/return!  You may shower, but no soaking baths/hot tubs/pools for 1 week.   Increase activity slowly   Complete by:  As directed       Discharge Medications   Allergies as of 09/18/2017      Reactions   Ivp Dye [iodinated Diagnostic Agents] Hives, Shortness Of Breath   reaction was when pt was 77 years old.   Macrodantin [nitrofurantoin] Other (See Comments)   Fever that lasted several months      Medication List    STOP taking these medications   hydrochlorothiazide 12.5 MG capsule Commonly known as:  MICROZIDE   valsartan 160 MG tablet Commonly known as:  DIOVAN     TAKE these medications   acetaminophen 500 MG tablet Commonly known as:  TYLENOL Take 1,000 mg by mouth every 6 (six) hours as needed (for back pain.).   albuterol 108 (90 Base) MCG/ACT inhaler Commonly known as:  PROVENTIL HFA;VENTOLIN HFA Inhale 2 puffs into the lungs every 6 (six) hours as needed for wheezing or shortness of breath. Reported on 11/29/2015   atorvastatin 80 MG tablet Commonly known as:  LIPITOR Take 80 mg by mouth daily.   chlordiazePOXIDE 25 MG capsule Commonly known as:  LIBRIUM Take 25 mg po 4 Times 09/18/17; Then take 25 mg po TID 09/19/17; Then take 25 mg po BID 09/20/17; Then take 25 mg po Daily x 1 Mena on 09/21/17   clopidogrel 75 MG tablet Commonly known as:  PLAVIX Take 75 mg by mouth daily.   DULoxetine 60 MG capsule Commonly known as:  CYMBALTA Take 60 mg by  mouth daily.   fenofibrate 145 MG tablet Commonly known as:  TRICOR Take 145 mg by mouth daily.   folic acid 1 MG tablet Commonly known as:  FOLVITE Take 1 tablet (1 mg total) by mouth daily. Start taking on:  09/19/2017 What changed:    medication strength  how much to take   HYDROcodone-acetaminophen 5-325 MG tablet Commonly known as:  NORCO/VICODIN Take 1 tablet by mouth 2 (two) times daily.   hydrOXYzine 25 MG tablet Commonly known as:  ATARAX/VISTARIL Take 1 tablet (25 mg total) by mouth every 6 (six) hours as  needed for anxiety (or CIWA score </= 10).   loperamide 2 MG capsule Commonly known as:  IMODIUM Take 1-2 capsules (2-4 mg total) by mouth as needed for diarrhea or loose stools.   metoprolol succinate 50 MG 24 hr tablet Commonly known as:  TOPROL-XL Take 1 tablet (50 mg total) by mouth daily. Take with or immediately following a meal. Start taking on:  09/19/2017   multivitamin with minerals Tabs tablet Take 1 tablet by mouth daily. Start taking on:  09/19/2017   MYRBETRIQ 50 MG Tb24 tablet Generic drug:  mirabegron ER Take 50 mg by mouth daily.   NITROSTAT 0.4 MG SL tablet Generic drug:  nitroGLYCERIN Place 0.4 mg under the tongue every 5 (five) minutes as needed for chest pain. Reported on 11/29/2015   omeprazole 40 MG capsule Commonly known as:  PRILOSEC TAKE ONE CAPSULE BY MOUTH TWICE DAILY   SYMBICORT 160-4.5 MCG/ACT inhaler Generic drug:  budesonide-formoterol INHALE TWO PUFFS INTO LUNGS TWICE DAILY   tadalafil 5 MG tablet Commonly known as:  CIALIS Take 5 mg by mouth daily. For BPH   thiamine 100 MG tablet Take 1 tablet (100 mg total) by mouth daily. Start taking on:  09/19/2017   tiZANidine 4 MG capsule Commonly known as:  ZANAFLEX Take 4 mg by mouth 3 (three) times daily as needed for muscle spasms.   VOLTAREN 1 % Gel Generic drug:  diclofenac sodium Apply 4 g topically 4 (four) times daily as needed for pain.        Aspirin  prescribed at discharge?  No, per Dr. Marlou Porch High Intensity Statin Prescribed? (Lipitor 40-80mg  or Crestor 20-40mg ): Yes Beta Blocker Prescribed? Yes For EF <40%, was ACEI/ARB Prescribed? No: AKI ADP Receptor Inhibitor Prescribed? (i.e. Plavix etc.-Includes Medically Managed Patients): Yes For EF <40%, Aldosterone Inhibitor Prescribed? No: AKI Was EF assessed during THIS hospitalization? Yes Was Cardiac Rehab II ordered? (Included Medically managed Patients): No: Not required for takotsubo cardiomyopathy per Dr Marlou Porch   Outstanding Labs/Studies   Check creatinine/K at follow up  Repeat echo in 3 months  Duration of Discharge Encounter   Greater than 30 minutes including physician time.  Signed, Tami Lin Duke NP 09/18/2017, 2:11 PM   Personally seen and examined. Agree with above.   1. Stress-induced cardiomyopathy/NSTEMI - heart cath with mild nonobstructive disease - medical management - Continue ASA and aggressive secondary prevention - Continue ASA and plavix for DAPT - EKG with deep inverted T waves in precordial leads consistent with takatsubo   - continue toprol - pt appears euvolemic on exam  2. Hyponatremia - resolving, now 131 (nearly baseline seen in 12/18 - management per IM, appreciate their assistance.  - continue with water restriction - I would be fine with decreasing cymbalta if this is felt to help.  - holding irbesartan. Fluid restrict 1L.    3. ETOH withdrawal DT  - TRH assistance appreciated. CIWA  - Discussed with Dr. Alfredia Ferguson. Score now is 0. He will write for librium taper, thiamine, MVI and loperamide if diarrhea occurs. OK to go home. 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   -Continue to hold ARB on DC because of hyponatremia and AKI -OK to give Bb, statin, plavix.  -No referral to cardiac rehab because with stress induced cardiomyopathy, he should return to normal in several days.  - set him up for follow up with me or APP in 2 weeks.   Candee Furbish, MD

## 2017-09-18 NOTE — Progress Notes (Signed)
Physical Therapy Treatment Patient Details Name: Brad Turner MRN: [id number] DOB: April 23, 1941 Today's Date: 09/18/2017    History of Present Illness Pt is a 77 y/o male with a PMH significant of CAD s/p multiple POBA, HTN, HLD, COPD, pericarditis. Pt presents with chest pain and was found to have troponin elevated to 1.76 and ECG revealed NSTEMI. He is now s/p cardiac catheterization.     PT Comments    Pt with slow progression towards goals. Continues to be extremely limited by fatigue only tolerating short ambulation distances, and requiring min A throughout. Education provided about deficits and decreased safety and current d/c recommendations, however, pt continues to insist he needs to go home. Unsure of understanding about SNF, so will need to reinforce. Will continue to follow acutely to maximize functional mobility independence and safety.    Follow Up Recommendations  SNF;Supervision/Assistance - 24 hour     Equipment Recommendations  Rolling walker with 5" wheels;3in1 (PT)    Recommendations for Other Services       Precautions / Restrictions Precautions Precautions: Fall Restrictions Weight Bearing Restrictions: No    Mobility  Bed Mobility Overal bed mobility: Needs Assistance Bed Mobility: Supine to Sit;Sit to Supine     Supine to sit: Supervision Sit to supine: Supervision   General bed mobility comments: Supervision for safety. Increased time and effort required. Pt attempting to lay back at EOB, instead of laying towards head of bed. Required cues for sequencing and positioning.   Transfers Overall transfer level: Needs assistance Equipment used: Rolling walker (2 wheeled) Transfers: Sit to/from Stand Sit to Stand: Min assist         General transfer comment: Min A for lift assist and steadying assist. Verbal cues for safe hand placement.   Ambulation/Gait Ambulation/Gait assistance: Min assist Ambulation Distance (Feet): 10 Feet Assistive device:  Rolling walker (2 wheeled) Gait Pattern/deviations: Step-to pattern;Step-through pattern;Decreased stride length;Shuffle Gait velocity: Decreased  Gait velocity interpretation: Below normal speed for age/gender General Gait Details: Very shaky throught gait requiring min A for steadying assist. Also requried cues for safe use of RW. Only able to tolerate short distance in room before requesting to go back to bed.    Stairs            Wheelchair Mobility    Modified Rankin (Stroke Patients Only)       Balance Overall balance assessment: Needs assistance Sitting-balance support: Feet supported;No upper extremity supported Sitting balance-Leahy Scale: Fair     Standing balance support: During functional activity;Bilateral upper extremity supported Standing balance-Leahy Scale: Poor Standing balance comment: Reliant on UE support for balance.                             Cognition Arousal/Alertness: Awake/alert Behavior During Therapy: Flat affect Overall Cognitive Status: Impaired/Different from baseline Area of Impairment: Memory;Following commands;Safety/judgement;Problem solving                     Memory: Decreased short-term memory Following Commands: Follows one step commands with increased time Safety/Judgement: Decreased awareness of safety;Decreased awareness of deficits   Problem Solving: Difficulty sequencing;Requires verbal cues;Requires tactile cues General Comments: Pt very unaware of deficits. Pt unable to tolerate ambulation within the room and when asked how he would get into his house and manage with mobility at home, pt replies "I won't take any chances."      Exercises      General Comments  General comments (skin integrity, edema, etc.): Pt very unaware of deficits. Extensive time to explain purpose of SNF, however, unsure if pt is understanding. Reports he has too much to do right now.       Pertinent Vitals/Pain Pain  Assessment: Faces Faces Pain Scale: No hurt    Home Living                      Prior Function            PT Goals (current goals can now be found in the care plan section) Acute Rehab PT Goals Patient Stated Goal: Return home PT Goal Formulation: With patient Time For Goal Achievement: 09/22/17 Potential to Achieve Goals: Good Progress towards PT goals: Progressing toward goals(slowly )    Frequency    Min 2X/week      PT Plan Current plan remains appropriate;Frequency needs to be updated    Co-evaluation              AM-PAC PT "6 Clicks" Daily Activity  Outcome Measure  Difficulty turning over in bed (including adjusting bedclothes, sheets and blankets)?: A Lot Difficulty moving from lying on back to sitting on the side of the bed? : A Lot Difficulty sitting down on and standing up from a chair with arms (e.g., wheelchair, bedside commode, etc,.)?: Unable Help needed moving to and from a bed to chair (including a wheelchair)?: A Little Help needed walking in hospital room?: A Little Help needed climbing 3-5 steps with a railing? : A Lot 6 Click Score: 13    End of Session Equipment Utilized During Treatment: Gait belt Activity Tolerance: Patient limited by fatigue;Patient limited by lethargy Patient left: in bed;with call bell/phone within reach;with bed alarm set;Other (comment)(seizure pads in place) Nurse Communication: Mobility status PT Visit Diagnosis: Unsteadiness on feet (R26.81);Difficulty in walking, not elsewhere classified (R26.2)     Time: 9563-8756 PT Time Calculation (min) (ACUTE ONLY): 24 min  Charges:  $Therapeutic Activity: 23-37 mins                    G Codes:       Leighton Ruff, PT, DPT  Acute Rehabilitation Services  Pager: [phone]    Rudean Hitt 09/18/2017, 3:42 PM

## 2017-09-20 ENCOUNTER — Encounter (HOSPITAL_COMMUNITY): Payer: Self-pay | Admitting: Emergency Medicine

## 2017-09-20 ENCOUNTER — Inpatient Hospital Stay (HOSPITAL_COMMUNITY)
Admission: EM | Admit: 2017-09-20 | Discharge: 2017-09-25 | DRG: 683 | Disposition: A | Discharge status: Skilled Nursing Facility | Payer: Medicare Other | Attending: Family Medicine | Admitting: Family Medicine

## 2017-09-20 ENCOUNTER — Other Ambulatory Visit: Payer: Self-pay

## 2017-09-20 DIAGNOSIS — R4189 Other symptoms and signs involving cognitive functions and awareness: Secondary | ICD-10-CM | POA: Diagnosis not present

## 2017-09-20 DIAGNOSIS — E860 Dehydration: Secondary | ICD-10-CM | POA: Diagnosis present

## 2017-09-20 DIAGNOSIS — F0390 Unspecified dementia without behavioral disturbance: Secondary | ICD-10-CM | POA: Diagnosis present

## 2017-09-20 DIAGNOSIS — M549 Dorsalgia, unspecified: Secondary | ICD-10-CM | POA: Diagnosis present

## 2017-09-20 DIAGNOSIS — Z7951 Long term (current) use of inhaled steroids: Secondary | ICD-10-CM

## 2017-09-20 DIAGNOSIS — Z8261 Family history of arthritis: Secondary | ICD-10-CM

## 2017-09-20 DIAGNOSIS — Z823 Family history of stroke: Secondary | ICD-10-CM

## 2017-09-20 DIAGNOSIS — I5181 Takotsubo syndrome: Secondary | ICD-10-CM | POA: Diagnosis present

## 2017-09-20 DIAGNOSIS — F1010 Alcohol abuse, uncomplicated: Secondary | ICD-10-CM | POA: Diagnosis present

## 2017-09-20 DIAGNOSIS — E440 Moderate protein-calorie malnutrition: Secondary | ICD-10-CM

## 2017-09-20 DIAGNOSIS — Z9842 Cataract extraction status, left eye: Secondary | ICD-10-CM

## 2017-09-20 DIAGNOSIS — Z91041 Radiographic dye allergy status: Secondary | ICD-10-CM

## 2017-09-20 DIAGNOSIS — E785 Hyperlipidemia, unspecified: Secondary | ICD-10-CM | POA: Diagnosis present

## 2017-09-20 DIAGNOSIS — N179 Acute kidney failure, unspecified: Principal | ICD-10-CM | POA: Diagnosis present

## 2017-09-20 DIAGNOSIS — Z86010 Personal history of colonic polyps: Secondary | ICD-10-CM

## 2017-09-20 DIAGNOSIS — Z955 Presence of coronary angioplasty implant and graft: Secondary | ICD-10-CM

## 2017-09-20 DIAGNOSIS — R0602 Shortness of breath: Secondary | ICD-10-CM

## 2017-09-20 DIAGNOSIS — G9340 Encephalopathy, unspecified: Secondary | ICD-10-CM | POA: Diagnosis present

## 2017-09-20 DIAGNOSIS — K219 Gastro-esophageal reflux disease without esophagitis: Secondary | ICD-10-CM | POA: Diagnosis present

## 2017-09-20 DIAGNOSIS — Z8673 Personal history of transient ischemic attack (TIA), and cerebral infarction without residual deficits: Secondary | ICD-10-CM

## 2017-09-20 DIAGNOSIS — F05 Delirium due to known physiological condition: Secondary | ICD-10-CM | POA: Diagnosis present

## 2017-09-20 DIAGNOSIS — Z8249 Family history of ischemic heart disease and other diseases of the circulatory system: Secondary | ICD-10-CM

## 2017-09-20 DIAGNOSIS — Z888 Allergy status to other drugs, medicaments and biological substances status: Secondary | ICD-10-CM

## 2017-09-20 DIAGNOSIS — Z9181 History of falling: Secondary | ICD-10-CM

## 2017-09-20 DIAGNOSIS — Z87442 Personal history of urinary calculi: Secondary | ICD-10-CM

## 2017-09-20 DIAGNOSIS — I110 Hypertensive heart disease with heart failure: Secondary | ICD-10-CM | POA: Diagnosis present

## 2017-09-20 DIAGNOSIS — K2270 Barrett's esophagus without dysplasia: Secondary | ICD-10-CM | POA: Diagnosis present

## 2017-09-20 DIAGNOSIS — R748 Abnormal levels of other serum enzymes: Secondary | ICD-10-CM | POA: Diagnosis present

## 2017-09-20 DIAGNOSIS — J439 Emphysema, unspecified: Secondary | ICD-10-CM | POA: Diagnosis present

## 2017-09-20 DIAGNOSIS — N401 Enlarged prostate with lower urinary tract symptoms: Secondary | ICD-10-CM | POA: Diagnosis present

## 2017-09-20 DIAGNOSIS — Z716 Tobacco abuse counseling: Secondary | ICD-10-CM

## 2017-09-20 DIAGNOSIS — I951 Orthostatic hypotension: Secondary | ICD-10-CM | POA: Diagnosis present

## 2017-09-20 DIAGNOSIS — Z85828 Personal history of other malignant neoplasm of skin: Secondary | ICD-10-CM

## 2017-09-20 DIAGNOSIS — Z9841 Cataract extraction status, right eye: Secondary | ICD-10-CM

## 2017-09-20 DIAGNOSIS — Z7902 Long term (current) use of antithrombotics/antiplatelets: Secondary | ICD-10-CM

## 2017-09-20 DIAGNOSIS — H3530 Unspecified macular degeneration: Secondary | ICD-10-CM | POA: Diagnosis present

## 2017-09-20 DIAGNOSIS — F329 Major depressive disorder, single episode, unspecified: Secondary | ICD-10-CM | POA: Diagnosis present

## 2017-09-20 DIAGNOSIS — I119 Hypertensive heart disease without heart failure: Secondary | ICD-10-CM | POA: Diagnosis present

## 2017-09-20 DIAGNOSIS — E538 Deficiency of other specified B group vitamins: Secondary | ICD-10-CM | POA: Diagnosis present

## 2017-09-20 DIAGNOSIS — I5042 Chronic combined systolic (congestive) and diastolic (congestive) heart failure: Secondary | ICD-10-CM | POA: Diagnosis present

## 2017-09-20 DIAGNOSIS — I2510 Atherosclerotic heart disease of native coronary artery without angina pectoris: Secondary | ICD-10-CM | POA: Diagnosis present

## 2017-09-20 DIAGNOSIS — R3915 Urgency of urination: Secondary | ICD-10-CM | POA: Diagnosis present

## 2017-09-20 DIAGNOSIS — G8929 Other chronic pain: Secondary | ICD-10-CM | POA: Diagnosis present

## 2017-09-20 DIAGNOSIS — Z79899 Other long term (current) drug therapy: Secondary | ICD-10-CM

## 2017-09-20 DIAGNOSIS — F17210 Nicotine dependence, cigarettes, uncomplicated: Secondary | ICD-10-CM | POA: Diagnosis present

## 2017-09-20 DIAGNOSIS — Z79891 Long term (current) use of opiate analgesic: Secondary | ICD-10-CM

## 2017-09-20 DIAGNOSIS — G629 Polyneuropathy, unspecified: Secondary | ICD-10-CM | POA: Diagnosis present

## 2017-09-20 DIAGNOSIS — R55 Syncope and collapse: Secondary | ICD-10-CM | POA: Diagnosis present

## 2017-09-20 HISTORY — DX: Alcohol dependence with withdrawal, unspecified: F10.239

## 2017-09-20 HISTORY — DX: Hypo-osmolality and hyponatremia: E87.1

## 2017-09-20 HISTORY — DX: Gastric ulcer, unspecified as acute or chronic, without hemorrhage or perforation: K25.9

## 2017-09-20 HISTORY — DX: Takotsubo syndrome: I51.81

## 2017-09-20 HISTORY — DX: Alcohol use, unspecified with withdrawal, unspecified: F10.939

## 2017-09-20 HISTORY — DX: Gastritis, unspecified, without bleeding: K29.70

## 2017-09-20 LAB — COMPREHENSIVE METABOLIC PANEL
ALT: 32 U/L (ref 17–63)
AST: 55 U/L — ABNORMAL HIGH (ref 15–41)
Albumin: 3.4 g/dL — ABNORMAL LOW (ref 3.5–5.0)
Alkaline Phosphatase: 43 U/L (ref 38–126)
Anion gap: 10 (ref 5–15)
BILIRUBIN TOTAL: 0.9 mg/dL (ref 0.3–1.2)
BUN: 45 mg/dL — ABNORMAL HIGH (ref 6–20)
CHLORIDE: 96 mmol/L — AB (ref 101–111)
CO2: 22 mmol/L (ref 22–32)
Calcium: 8.5 mg/dL — ABNORMAL LOW (ref 8.9–10.3)
Creatinine, Ser: 2.22 mg/dL — ABNORMAL HIGH (ref 0.61–1.24)
GFR, EST AFRICAN AMERICAN: 32 mL/min — AB (ref >60)
GFR, EST NON AFRICAN AMERICAN: 27 mL/min — AB (ref >60)
Glucose, Bld: 72 mg/dL (ref 65–99)
POTASSIUM: 4 mmol/L (ref 3.5–5.1)
Sodium: 128 mmol/L — ABNORMAL LOW (ref 135–145)
TOTAL PROTEIN: 6.5 g/dL (ref 6.5–8.1)

## 2017-09-20 LAB — CBC WITH DIFFERENTIAL/PLATELET
BASOS ABS: 0 10*3/uL (ref 0.0–0.1)
BASOS PCT: 0 %
EOS ABS: 0 10*3/uL (ref 0.0–0.7)
Eosinophils Relative: 0 %
HEMATOCRIT: 29.5 % — AB (ref 39.0–52.0)
HEMOGLOBIN: 10.4 g/dL — AB (ref 13.0–17.0)
Lymphocytes Relative: 22 %
Lymphs Abs: 1.5 10*3/uL (ref 0.7–4.0)
MCH: 34.8 pg — ABNORMAL HIGH (ref 26.0–34.0)
MCHC: 35.3 g/dL (ref 30.0–36.0)
MCV: 98.7 fL (ref 78.0–100.0)
MONO ABS: 0.7 10*3/uL (ref 0.1–1.0)
MONOS PCT: 10 %
NEUTROS ABS: 4.6 10*3/uL (ref 1.7–7.7)
NEUTROS PCT: 68 %
Platelets: 343 10*3/uL (ref 150–400)
RBC: 2.99 MIL/uL — ABNORMAL LOW (ref 4.22–5.81)
RDW: 14 % (ref 11.5–15.5)
WBC: 6.7 10*3/uL (ref 4.0–10.5)

## 2017-09-20 LAB — RAPID URINE DRUG SCREEN, HOSP PERFORMED
AMPHETAMINES: NOT DETECTED
BARBITURATES: NOT DETECTED
BENZODIAZEPINES: POSITIVE — AB
Cocaine: NOT DETECTED
Opiates: POSITIVE — AB
TETRAHYDROCANNABINOL: NOT DETECTED

## 2017-09-20 LAB — URINALYSIS, ROUTINE W REFLEX MICROSCOPIC
Bilirubin Urine: NEGATIVE
GLUCOSE, UA: NEGATIVE mg/dL
HGB URINE DIPSTICK: NEGATIVE
Ketones, ur: 5 mg/dL — AB
LEUKOCYTES UA: NEGATIVE
Nitrite: NEGATIVE
PROTEIN: NEGATIVE mg/dL
Specific Gravity, Urine: 1.016 (ref 1.005–1.030)
pH: 5 (ref 5.0–8.0)

## 2017-09-20 LAB — I-STAT TROPONIN, ED: TROPONIN I, POC: 0.17 ng/mL — AB (ref 0.00–0.08)

## 2017-09-20 LAB — I-STAT CG4 LACTIC ACID, ED: LACTIC ACID, VENOUS: 0.62 mmol/L (ref 0.5–1.9)

## 2017-09-20 LAB — ETHANOL

## 2017-09-20 LAB — PROTIME-INR
INR: 0.93
PROTHROMBIN TIME: 12.3 s (ref 11.4–15.2)

## 2017-09-20 LAB — BRAIN NATRIURETIC PEPTIDE: B NATRIURETIC PEPTIDE 5: 243.7 pg/mL — AB (ref 0.0–100.0)

## 2017-09-20 LAB — ACETAMINOPHEN LEVEL

## 2017-09-20 NOTE — ED Provider Notes (Signed)
[facility] DEPT Provider Note   CSN: [id number] Arrival date & time: 09/20/17  2017     History   Chief Complaint Chief Complaint  Patient presents with  . Hypotension    HPI Brad Turner is a 77 y.o. male.  HPI At this time, there are no bystander historians to describe the episode that brought patient to the emergency department.  The patient reports that everything really "got blown out of proportion" by a bunch of hypochondriac's.  He reports that his male companion was with him and that if there is not drama, she is not happy.  He reports that if she comes to the ED, she is going to really exaggerate how bad his physical condition is.  He is denying any chest pain, headache, shortness of breath.  He reports his last alcohol was 8 days ago.  He does report he continues to smoke.  EMS history differ significantly from that reported by the patient. Past Medical History:  Diagnosis Date  . Allergy    seasonal  . Arthritis   . Barrett esophagus   . BPH (benign prostatic hyperplasia)   . Bronchitis    recently   . Bruise    forehead and under both eyes  . CAD (coronary artery disease)    takes Plavix and Aspirin daily  . Chronic back pain   . COPD (chronic obstructive pulmonary disease) (HCC)    Albuterol inhaler as needed.Uses Symbicort daily  . Depression    doesn't take any meds  . Emphysema lung ([place])   . Fall    fell on 10/06/16 with loss of consiousness for a short time.States he remembers getting up but remembers nothing else.  Marland Kitchen GERD (gastroesophageal reflux disease)    takes Omeprazole daily  . History of colon polyps    benign  . History of kidney stones   . Hyperlipidemia    takes Fenofibrate and Lipitor daily  . Hypertension    takes HCTZ and Diovan daily  . Joint pain   . Joint swelling   . 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 Neuropathy    Lyrica every couple of days.  .  Nocturia   . Pneumonia    hx of  . PONV (postoperative nausea and vomiting)   . Skin cancer   . Stroke [employer] 1995   Affected balance; and has very emotional if watching a movie  . Syncope    yr ago  . Urgency of urination    takes Myrbetriq daily    Patient Active Problem List   Diagnosis Date Noted  . Acute delirium 09/16/2017  . Acute hyponatremia 09/16/2017  . Alcohol withdrawal ([place]) 09/16/2017  . 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 Tobacco abuse 09/16/2017  . Depression 09/16/2017  . History of CVA (cerebrovascular accident) 09/16/2017  . History of Barrett's esophagus 09/16/2017  . Takotsubo cardiomyopathy 09/15/2017  . Chronic back pain 12/01/2016  . S/P lumbar spinal fusion 12/01/2016  . Dysthymia 12/01/2016  . Radiculopathy 10/17/2016  . COPD mixed type ([place]) 06/10/2016  . Abnormal CT of the chest 06/10/2016  . Dysphagia   . Cigarette smoker 06/07/2015  . Essential hypertension, benign 10/30/2014    Class: Chronic  . Syncope 10/29/2014  . Cough syncope 10/07/2014  . COPD exacerbation ([place]) 09/29/2014  . Pulmonary nodules 11/22/2013  . Chest pain, unspecified 10/15/2013  . Personal  history of colonic polyps 06/04/2010  . COPD (chronic obstructive pulmonary disease) with emphysema ([place]) 02/26/2010  . CHEST PAIN, ATYPICAL 02/26/2010  . BARRETTS ESOPHAGUS 05/18/2009  . HYPERLIPIDEMIA 04/28/2008  . Coronary atherosclerosis 04/28/2008  . CVA 04/28/2008  . GERD 04/28/2008  . ARTHRITIS 04/28/2008  . NEPHROLITHIASIS, HX OF 04/28/2008    Past Surgical History:  Procedure Laterality Date  . APPENDECTOMY    . CARDIAC CATHETERIZATION     x 4. Most recent one in 2011 at [facility](3 previous were done 20 yrs before)  . cataracts Bilateral   . COLONOSCOPY    . CYSTOSCOPY  07/21/2013   Dr. Jeffie Pollock  . DENTAL SURGERY     with sedation  . ESOPHAGEAL MANOMETRY N/A 09/20/2015   Procedure: ESOPHAGEAL MANOMETRY (EM);  Surgeon: Manus Gunning, MD;  Location: [facility];  Service: Gastroenterology;  Laterality: N/A;  . LEFT HEART CATH AND CORONARY ANGIOGRAPHY N/A 09/15/2017   Procedure: LEFT HEART CATH AND CORONARY ANGIOGRAPHY;  Surgeon: Martinique, Peter M, MD;  Location: [facility];  Service: Cardiovascular;  Laterality: N/A;  . LUMBAR LAMINECTOMY/DECOMPRESSION MICRODISCECTOMY N/A 01/12/2015   Procedure: LUMBAR LAMINECTOMY/DECOMPRESSION MICRODISCECTOMY 3 LEVELS;  Surgeon: Phylliss Bob, MD;  Location: [facility];  Service: Orthopedics;  Laterality: N/A;  Lumbar 3-4, lumbar 4-5, lumbar 5-sacrum 1 decompression  . parathyroid adenoma removed    . POLYPECTOMY    . ROTATOR CUFF REPAIR     bilateral  . TONSILLECTOMY    . UPPER GASTROINTESTINAL ENDOSCOPY         Home Medications    Prior to Admission medications   Medication Sig Start Date End Date Taking? Authorizing Provider  acetaminophen (TYLENOL) 500 MG tablet Take 1,000 mg by mouth every 6 (six) hours as needed (for back pain.).   Yes [provider]  albuterol (PROVENTIL HFA;VENTOLIN HFA) 108 (90 BASE) MCG/ACT inhaler Inhale 2 puffs into the lungs every 6 (six) hours as needed for wheezing or shortness of breath. Reported on 11/29/2015   Yes [provider]  atorvastatin (LIPITOR) 80 MG tablet Take 80 mg by mouth daily.    Yes [provider]  chlordiazePOXIDE (LIBRIUM) 25 MG capsule Take 25 mg po 4 Times 09/18/17; Then take 25 mg po TID 09/19/17; Then take 25 mg po BID 09/20/17; Then take 25 mg po Daily x 1 [place] on 09/21/17 09/18/17  Yes Sheikh, [place], DO  clopidogrel (PLAVIX) 75 MG tablet Take 75 mg by mouth daily.   Yes [provider]  DULoxetine (CYMBALTA) 60 MG capsule Take 60 mg by mouth daily.   Yes [provider]  fenofibrate (TRICOR) 145 MG tablet Take 145 mg by mouth daily.    Yes [provider]  folic acid (FOLVITE) 1 MG tablet Take 1 tablet (1 mg total) by mouth daily. 09/19/17  Yes Sheikh, Omair  Latif, DO  HYDROcodone-acetaminophen (NORCO/VICODIN) 5-325 MG tablet Take 1 tablet by mouth 2 (two) times daily.   Yes [provider]  hydrOXYzine (ATARAX/VISTARIL) 25 MG tablet Take 1 tablet (25 mg total) by mouth every 6 (six) hours as needed for anxiety (or CIWA score </= 10). 09/18/17  Yes Sheikh, Omair Latif, DO  loperamide (IMODIUM) 2 MG capsule Take 1-2 capsules (2-4 mg total) by mouth as needed for diarrhea or loose stools. 09/18/17  Yes Sheikh, Omair Latif, DO  metoprolol succinate (TOPROL-XL) 50 MG 24 hr tablet Take 1 tablet (50 mg total) by mouth daily. Take with or immediately following a meal.  09/19/17  Yes Duke, Tami Lin, PA  Multiple Vitamin (MULTIVITAMIN WITH MINERALS) TABS tablet Take 1 tablet by mouth daily. 09/19/17  Yes Sheikh, Omair Latif, DO  MYRBETRIQ 50 MG TB24 tablet Take 50 mg by mouth daily.  05/03/15  Yes [provider]  NITROSTAT 0.4 MG SL tablet Place 0.4 mg under the tongue every 5 (five) minutes as needed for chest pain. Reported on 11/29/2015 05/29/13  Yes [provider]  omeprazole (PRILOSEC) 40 MG capsule TAKE ONE CAPSULE BY MOUTH TWICE DAILY 04/22/16  Yes Armbruster, Carlota Raspberry, MD  SYMBICORT 160-4.5 MCG/ACT inhaler INHALE TWO PUFFS INTO LUNGS TWICE DAILY 04/19/14  Yes Clance, Armando Reichert, MD  tadalafil (CIALIS) 5 MG tablet Take 5 mg by mouth daily. For BPH   Yes [provider]  thiamine 100 MG tablet Take 1 tablet (100 mg total) by mouth daily. 09/19/17  Yes Sheikh, Omair Latif, DO  tiZANidine (ZANAFLEX) 4 MG capsule Take 4 mg by mouth 3 (three) times daily as needed for muscle spasms.    Yes [provider]  VOLTAREN 1 % GEL Apply 4 g topically 4 (four) times daily as needed for pain. 09/11/17  Yes [provider]    Family History Family History  Problem Relation Age of Onset  . Stroke Mother   . Rheum arthritis Mother   . Heart disease Father   . Colon cancer Unknown        ? mat. uncle died age 18  . Colon  polyps Neg Hx   . Esophageal cancer Neg Hx   . Rectal cancer Neg Hx   . Stomach cancer Neg Hx     Social History Social History   Tobacco Use  . Smoking status: Current Every Wojtowicz Smoker    Packs/[place]: 1.50    Years: 58.00    Pack years: 87.00    Types: Cigarettes  . Smokeless tobacco: Never Used  Substance Use Topics  . Alcohol use: Yes    Alcohol/week: 8.4 oz    Types: 14 Standard drinks or equivalent per week    Comment: every Brozowski, vodka- 1-2 a Leppanen   . Drug use: No     Allergies   Ivp dye [iodinated diagnostic agents] and Macrodantin [nitrofurantoin]   Review of Systems Review of Systems 10 Systems reviewed and are negative for acute change except as noted in the HPI.  Physical Exam Updated Vital Signs BP 107/69   Pulse 73   Temp (!) 97.5 F (36.4 C) (Oral)   Resp 20   SpO2 97%   Physical Exam  Constitutional: He is oriented to person, place, and time. He appears well-developed and well-nourished.  HENT:  Head: Normocephalic and atraumatic.  Mouth/Throat: Oropharynx is clear and moist.  Eyes: Conjunctivae and EOM are normal. Pupils are equal, round, and reactive to light.  Neck: Neck supple.  Cardiovascular: Normal rate and regular rhythm.  No murmur heard. Pulmonary/Chest: Effort normal and breath sounds normal. No respiratory distress.  Abdominal: Soft. There is no tenderness.  Musculoskeletal: Normal range of motion. He exhibits no edema or tenderness.  Neurological: He is alert and oriented to person, place, and time. No cranial nerve deficit. He exhibits normal muscle tone. Coordination normal.  Patient is slightly tremulous.  No pronator drift.  Skin: Skin is warm and dry.  Psychiatric: He has a normal mood and affect.  Nursing note and vitals reviewed.    ED Treatments / Results  Labs (all labs ordered are listed, but  only abnormal results are displayed) Labs Reviewed  ACETAMINOPHEN LEVEL - Abnormal; Notable for the following components:       Result Value   Acetaminophen (Tylenol), Serum <10 (*)    All other components within normal limits  COMPREHENSIVE METABOLIC PANEL - Abnormal; Notable for the following components:   Sodium 128 (*)    Chloride 96 (*)    BUN 45 (*)    Creatinine, Ser 2.22 (*)    Calcium 8.5 (*)    Albumin 3.4 (*)    AST 55 (*)    GFR calc non Af Amer 27 (*)    GFR calc Af Amer 32 (*)    All other components within normal limits  BRAIN NATRIURETIC PEPTIDE - Abnormal; Notable for the following components:   B Natriuretic Peptide 243.7 (*)    All other components within normal limits  CBC WITH DIFFERENTIAL/PLATELET - Abnormal; Notable for the following components:   RBC 2.99 (*)    Hemoglobin 10.4 (*)    HCT 29.5 (*)    MCH 34.8 (*)    All other components within normal limits  URINALYSIS, ROUTINE W REFLEX MICROSCOPIC - Abnormal; Notable for the following components:   Ketones, ur 5 (*)    All other components within normal limits  RAPID URINE DRUG SCREEN, HOSP PERFORMED - Abnormal; Notable for the following components:   Opiates POSITIVE (*)    Benzodiazepines POSITIVE (*)    All other components within normal limits  I-STAT TROPONIN, ED - Abnormal; Notable for the following components:   Troponin i, poc 0.17 (*)    All other components within normal limits  ETHANOL  PROTIME-INR  I-STAT CG4 LACTIC ACID, ED  I-STAT CG4 LACTIC ACID, ED    EKG  EKG Interpretation  Date/Time:  Saturday September 20 2017 20:48:35 EST Ventricular Rate:  63 PR Interval:    QRS Duration: 95 QT Interval:  462 QTC Calculation: 473 R Axis:   25 Text Interpretation:  Sinus rhythm Abnrm T, consider ischemia, anterolateral lds similar to previous Confirmed by Charlesetta Shanks [phone]) on 09/20/2017 9:58:34 PM       Radiology No results found.  Procedures Procedures (including critical care time)  Medications Ordered in ED Medications - No data to display   Initial Impression / Assessment and Plan / ED Course    I have reviewed the triage vital signs and the nursing notes.  Pertinent labs & imaging results that were available during my care of the patient were reviewed by me and considered in my medical decision making (see chart for details).     Consult: Hospitalist for admission  Final Clinical Impressions(s) / ED Diagnoses   Final diagnoses:  Unresponsive episode   EMS history suggestive the patient was unresponsive and bystander CPR initiated.  Upon their arrival he was found to be hypotensive but becoming responsive.  Was recently hospitalized with Takosubo cardiomyopathy.  At this point, troponin is mildly elevated but may be trending down from previous.  EKG is consistent with prior with ST changes but not more elevation than previous.  These findings may very well be in resolution phase however EMS history is concerning for patient being unresponsive and bystanders feel he need to start CPR.  She does not wish to stay in the hospital however I feel that this is indicated for continued cardiac monitoring and serial enzymes to make sure these are continuing to trend down.  Patient reports he has not had alcohol for 8 days since  before his hospitalization.  He does have some tremulousness but is appropriately oriented without confusion, hypertension or tachycardia. ED Discharge Orders    None       Charlesetta Shanks, MD 09/21/17 0111

## 2017-09-20 NOTE — ED Notes (Signed)
I stat troponin given to RN and MD.

## 2017-09-20 NOTE — ED Notes (Signed)
Bed: WA02 Expected date:  Expected time:  Means of arrival:  Comments: 77 yo M/hypotensive

## 2017-09-20 NOTE — ED Triage Notes (Signed)
Per EMS, pt found by neighbors to be unresponsive this evening and compressions were started. Pt turned out to be just unconscious and woke up. EMS arrived on scene and systolic BP laying down was 80, sitting was 60, standing dropped to 50 palpated. Pt initially refused care, but then said he would come here and refused [facility]. Heart cath was completed on Sunday and is on Plavix.

## 2017-09-21 ENCOUNTER — Other Ambulatory Visit: Payer: Self-pay

## 2017-09-21 ENCOUNTER — Observation Stay (HOSPITAL_COMMUNITY): Payer: Medicare Other

## 2017-09-21 ENCOUNTER — Encounter (HOSPITAL_COMMUNITY): Payer: Self-pay

## 2017-09-21 DIAGNOSIS — R55 Syncope and collapse: Secondary | ICD-10-CM

## 2017-09-21 DIAGNOSIS — I10 Essential (primary) hypertension: Secondary | ICD-10-CM | POA: Diagnosis not present

## 2017-09-21 DIAGNOSIS — I5181 Takotsubo syndrome: Secondary | ICD-10-CM | POA: Diagnosis not present

## 2017-09-21 DIAGNOSIS — R0602 Shortness of breath: Secondary | ICD-10-CM | POA: Diagnosis not present

## 2017-09-21 LAB — COMPREHENSIVE METABOLIC PANEL
ALK PHOS: 45 U/L (ref 38–126)
ALT: 31 U/L (ref 17–63)
ANION GAP: 8 (ref 5–15)
AST: 49 U/L — ABNORMAL HIGH (ref 15–41)
Albumin: 3.4 g/dL — ABNORMAL LOW (ref 3.5–5.0)
BUN: 41 mg/dL — ABNORMAL HIGH (ref 6–20)
CALCIUM: 8.6 mg/dL — AB (ref 8.9–10.3)
CO2: 24 mmol/L (ref 22–32)
Chloride: 98 mmol/L — ABNORMAL LOW (ref 101–111)
Creatinine, Ser: 1.81 mg/dL — ABNORMAL HIGH (ref 0.61–1.24)
GFR, EST AFRICAN AMERICAN: 41 mL/min — AB (ref >60)
GFR, EST NON AFRICAN AMERICAN: 35 mL/min — AB (ref >60)
Glucose, Bld: 63 mg/dL — ABNORMAL LOW (ref 65–99)
Potassium: 3.8 mmol/L (ref 3.5–5.1)
Sodium: 130 mmol/L — ABNORMAL LOW (ref 135–145)
Total Bilirubin: 1.2 mg/dL (ref 0.3–1.2)
Total Protein: 6.2 g/dL — ABNORMAL LOW (ref 6.5–8.1)

## 2017-09-21 LAB — GLUCOSE, CAPILLARY
Glucose-Capillary: 55 mg/dL — ABNORMAL LOW (ref 65–99)
Glucose-Capillary: 79 mg/dL (ref 65–99)
Glucose-Capillary: 77 mg/dL (ref 65–99)

## 2017-09-21 LAB — TROPONIN I
TROPONIN I: 0.13 ng/mL — AB (ref <0.03)
Troponin I: 0.11 ng/mL (ref <0.03)
Troponin I: 0.11 ng/mL (ref <0.03)

## 2017-09-21 LAB — MAGNESIUM: Magnesium: 1.8 mg/dL (ref 1.7–2.4)

## 2017-09-21 LAB — SODIUM, URINE, RANDOM: SODIUM UR: 47 mmol/L

## 2017-09-21 LAB — TSH: TSH: 2.282 u[IU]/mL (ref 0.350–4.500)

## 2017-09-21 LAB — VITAMIN B12: Vitamin B-12: 279 pg/mL (ref 180–914)

## 2017-09-21 MED ORDER — MIRABEGRON ER 25 MG PO TB24
50.0000 mg | ORAL_TABLET | Freq: Every day | ORAL | Status: DC
  Administered 2017-09-21 – 2017-09-25 (×5): 50 mg via ORAL
  Filled 2017-09-21 (×5): qty 2

## 2017-09-21 MED ORDER — ENOXAPARIN SODIUM 30 MG/0.3ML ~~LOC~~ SOLN
30.0000 mg | SUBCUTANEOUS | Status: DC
  Administered 2017-09-21: 30 mg via SUBCUTANEOUS
  Filled 2017-09-21: qty 0.3

## 2017-09-21 MED ORDER — MORPHINE SULFATE (PF) 4 MG/ML IV SOLN: 1.0000 mg | INTRAVENOUS | Status: DC | PRN

## 2017-09-21 MED ORDER — METOPROLOL SUCCINATE ER 25 MG PO TB24
25.0000 mg | ORAL_TABLET | Freq: Every day | ORAL | Status: DC
  Administered 2017-09-22 – 2017-09-25 (×4): 25 mg via ORAL
  Filled 2017-09-21 (×4): qty 1

## 2017-09-21 MED ORDER — VITAMIN B-1 100 MG PO TABS
100.0000 mg | ORAL_TABLET | Freq: Every day | ORAL | Status: DC
  Administered 2017-09-21 – 2017-09-25 (×5): 100 mg via ORAL
  Filled 2017-09-21 (×5): qty 1

## 2017-09-21 MED ORDER — FENOFIBRATE 160 MG PO TABS
160.0000 mg | ORAL_TABLET | Freq: Every day | ORAL | Status: DC
Start: 2017-09-21 — End: 2017-09-25
  Administered 2017-09-21 – 2017-09-25 (×5): 160 mg via ORAL
  Filled 2017-09-21 (×5): qty 1

## 2017-09-21 MED ORDER — CLOPIDOGREL BISULFATE 75 MG PO TABS
75.0000 mg | ORAL_TABLET | Freq: Every day | ORAL | Status: DC
  Administered 2017-09-21 – 2017-09-25 (×5): 75 mg via ORAL
  Filled 2017-09-21 (×5): qty 1

## 2017-09-21 MED ORDER — POTASSIUM CHLORIDE CRYS ER 20 MEQ PO TBCR
40.0000 meq | EXTENDED_RELEASE_TABLET | Freq: Once | ORAL | Status: AC
  Administered 2017-09-21: 40 meq via ORAL
  Filled 2017-09-21: qty 2

## 2017-09-21 MED ORDER — DICLOFENAC SODIUM 1 % TD GEL
4.0000 g | Freq: Four times a day (QID) | TRANSDERMAL | Status: DC | PRN
  Filled 2017-09-21: qty 100

## 2017-09-21 MED ORDER — NITROGLYCERIN 0.4 MG SL SUBL: 0.4000 mg | SUBLINGUAL_TABLET | SUBLINGUAL | Status: DC | PRN

## 2017-09-21 MED ORDER — ATORVASTATIN CALCIUM 40 MG PO TABS
80.0000 mg | ORAL_TABLET | Freq: Every day | ORAL | Status: DC
  Administered 2017-09-21 – 2017-09-25 (×5): 80 mg via ORAL
  Filled 2017-09-21 (×5): qty 2

## 2017-09-21 MED ORDER — ENSURE ENLIVE PO LIQD
237.0000 mL | Freq: Two times a day (BID) | ORAL | Status: DC
  Administered 2017-09-21 – 2017-09-25 (×5): 237 mL via ORAL

## 2017-09-21 MED ORDER — HYDROCODONE-ACETAMINOPHEN 5-325 MG PO TABS: 1.0000 | ORAL_TABLET | Freq: Two times a day (BID) | ORAL | Status: DC

## 2017-09-21 MED ORDER — MAGNESIUM SULFATE IN D5W 1-5 GM/100ML-% IV SOLN
1.0000 g | Freq: Once | INTRAVENOUS | Status: AC
  Administered 2017-09-21: 1 g via INTRAVENOUS
  Filled 2017-09-21: qty 100

## 2017-09-21 MED ORDER — ADULT MULTIVITAMIN W/MINERALS CH
1.0000 | ORAL_TABLET | Freq: Every day | ORAL | Status: DC
  Administered 2017-09-21 – 2017-09-25 (×5): 1 via ORAL
  Filled 2017-09-21 (×5): qty 1

## 2017-09-21 MED ORDER — ACETAMINOPHEN 500 MG PO TABS: 1000.0000 mg | ORAL_TABLET | Freq: Four times a day (QID) | ORAL | Status: DC | PRN

## 2017-09-21 MED ORDER — DULOXETINE HCL 60 MG PO CPEP
60.0000 mg | ORAL_CAPSULE | Freq: Every day | ORAL | Status: DC
  Administered 2017-09-21 – 2017-09-22 (×2): 60 mg via ORAL
  Filled 2017-09-21 (×2): qty 1

## 2017-09-21 MED ORDER — DOCUSATE SODIUM 100 MG PO CAPS
100.0000 mg | ORAL_CAPSULE | Freq: Two times a day (BID) | ORAL | Status: DC
  Administered 2017-09-21 – 2017-09-25 (×10): 100 mg via ORAL
  Filled 2017-09-21 (×10): qty 1

## 2017-09-21 MED ORDER — TADALAFIL 5 MG PO TABS
5.0000 mg | ORAL_TABLET | Freq: Every day | ORAL | Status: DC
  Filled 2017-09-21: qty 1

## 2017-09-21 MED ORDER — PANTOPRAZOLE SODIUM 40 MG PO TBEC
40.0000 mg | DELAYED_RELEASE_TABLET | Freq: Every day | ORAL | Status: DC
  Administered 2017-09-21 – 2017-09-25 (×5): 40 mg via ORAL
  Filled 2017-09-21 (×5): qty 1

## 2017-09-21 MED ORDER — LOPERAMIDE HCL 2 MG PO CAPS: 2.0000 mg | ORAL_CAPSULE | ORAL | Status: DC | PRN

## 2017-09-21 MED ORDER — MAGNESIUM CITRATE PO SOLN: 1.0000 | Freq: Once | ORAL | Status: DC | PRN

## 2017-09-21 MED ORDER — SODIUM CHLORIDE 0.9 % IV SOLN
INTRAVENOUS | Status: AC
  Administered 2017-09-21: 14:00:00 via INTRAVENOUS

## 2017-09-21 MED ORDER — HYDROXYZINE HCL 25 MG PO TABS
25.0000 mg | ORAL_TABLET | Freq: Four times a day (QID) | ORAL | Status: DC | PRN
  Administered 2017-09-23: 25 mg via ORAL
  Filled 2017-09-21: qty 1

## 2017-09-21 MED ORDER — SODIUM CHLORIDE 0.9 % IV SOLN
INTRAVENOUS | Status: DC
  Administered 2017-09-21: 09:00:00 via INTRAVENOUS

## 2017-09-21 MED ORDER — SODIUM CHLORIDE 0.9% FLUSH
3.0000 mL | Freq: Two times a day (BID) | INTRAVENOUS | Status: DC
  Administered 2017-09-21 – 2017-09-25 (×5): 3 mL via INTRAVENOUS

## 2017-09-21 MED ORDER — ALBUTEROL SULFATE HFA 108 (90 BASE) MCG/ACT IN AERS: 2.0000 | INHALATION_SPRAY | Freq: Four times a day (QID) | RESPIRATORY_TRACT | Status: DC | PRN

## 2017-09-21 MED ORDER — METOPROLOL SUCCINATE ER 50 MG PO TB24
50.0000 mg | ORAL_TABLET | Freq: Every day | ORAL | Status: DC
  Administered 2017-09-21: 50 mg via ORAL
  Filled 2017-09-21: qty 1

## 2017-09-21 MED ORDER — FOLIC ACID 1 MG PO TABS
1.0000 mg | ORAL_TABLET | Freq: Every day | ORAL | Status: DC
  Administered 2017-09-21 – 2017-09-25 (×5): 1 mg via ORAL
  Filled 2017-09-21 (×5): qty 1

## 2017-09-21 MED ORDER — SODIUM CHLORIDE 0.9 % IV BOLUS (SEPSIS)
500.0000 mL | Freq: Once | INTRAVENOUS | Status: AC
  Administered 2017-09-21: 500 mL via INTRAVENOUS

## 2017-09-21 MED ORDER — TIZANIDINE HCL 4 MG PO TABS: 4.0000 mg | ORAL_TABLET | Freq: Three times a day (TID) | ORAL | Status: DC | PRN

## 2017-09-21 MED ORDER — FLUTICASONE FUROATE-VILANTEROL 200-25 MCG/INH IN AEPB
1.0000 | INHALATION_SPRAY | Freq: Every day | RESPIRATORY_TRACT | Status: DC
  Administered 2017-09-21 – 2017-09-25 (×4): 1 via RESPIRATORY_TRACT
  Filled 2017-09-21: qty 28

## 2017-09-21 MED ORDER — ALBUTEROL SULFATE (2.5 MG/3ML) 0.083% IN NEBU: 2.5000 mg | INHALATION_SOLUTION | Freq: Four times a day (QID) | RESPIRATORY_TRACT | Status: DC | PRN

## 2017-09-21 NOTE — H&P (Signed)
History and Physical    Brad Turner YQM:[id number] DOB: Feb 11, 1941 DOA: 09/20/2017  Referring MD/NP/PA: Dr Johnney Killian  PCP: Osborne Casco [name] Him, MD   Outpatient Specialists: Dr  Peter Martinique  Patient coming from: Home  Chief Complaint: Syncope  HPI: Brad Turner is a 77 y.o. male with medical history significant of alcohol abuse and recent diagnosis of Takotsubo cardiomyopathy with ST elevation MI including cardiac catheterization with stenting. Patient was discharged on January 14. He was brought back to the ER today after being found by friends and never completely confused and passed out. They reported patient being unresponsive but patient himself denied that. He is still relatively confused and was asking why he was in the ER. In the ER patient was noted to be confused however minimally elevated troponins. EKG looks normal and no other significant abnormalities. Syncope was suspected to be the main problem but no obvious cause. There was report of CPR being initiated. Patient is hypotensive. Patient is therefore being admitted for syncopal workup and observation.  ED Course: Patient has been evaluated with EKG and serial enzymes. IV fluid initiated due to hypertension.  Review of Systems: As per HPI otherwise 10 point review of systems negative.    Past Medical History:  Diagnosis Date  . Allergy    seasonal  . Arthritis   . Barrett esophagus   . BPH (benign prostatic hyperplasia)   . Bronchitis    recently   . Bruise    forehead and under both eyes  . CAD (coronary artery disease)    takes Plavix and Aspirin daily  . Chronic back pain   . COPD (chronic obstructive pulmonary disease) (HCC)    Albuterol inhaler as needed.Uses Symbicort daily  . Depression    doesn't take any meds  . Emphysema lung ([place])   . Fall    fell on 10/06/16 with loss of consiousness for a short time.States he remembers getting up but remembers nothing else.  Marland Kitchen GERD (gastroesophageal reflux  disease)    takes Omeprazole daily  . History of colon polyps    benign  . History of kidney stones   . Hyperlipidemia    takes Fenofibrate and Lipitor daily  . Hypertension    takes HCTZ and Diovan daily  . Joint pain   . Joint swelling   . 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 Neuropathy    Lyrica every couple of days.  . Nocturia   . Pneumonia    hx of  . PONV (postoperative nausea and vomiting)   . Skin cancer   . Stroke [facility]) 1995   Affected balance; and has very emotional if watching a movie  . Syncope    yr ago  . Urgency of urination    takes Myrbetriq daily    Past Surgical History:  Procedure Laterality Date  . APPENDECTOMY    . CARDIAC CATHETERIZATION     x 4. Most recent one in 2011 at [facility](3 previous were done 20 yrs before)  . cataracts Bilateral   . COLONOSCOPY    . CYSTOSCOPY  07/21/2013   Dr. Jeffie Pollock  . DENTAL SURGERY     with sedation  . ESOPHAGEAL MANOMETRY N/A 09/20/2015   Procedure: ESOPHAGEAL MANOMETRY (EM);  Surgeon: Manus Gunning, MD;  Location: [facility];  Service: Gastroenterology;  Laterality: N/A;  . LEFT HEART CATH AND CORONARY ANGIOGRAPHY N/A 09/15/2017   Procedure: LEFT  HEART CATH AND CORONARY ANGIOGRAPHY;  Surgeon: Martinique, Peter M, MD;  Location: [place] [facility];  Service: Cardiovascular;  Laterality: N/A;  . LUMBAR LAMINECTOMY/DECOMPRESSION MICRODISCECTOMY N/A 01/12/2015   Procedure: LUMBAR LAMINECTOMY/DECOMPRESSION MICRODISCECTOMY 3 LEVELS;  Surgeon: Phylliss Bob, MD;  Location: [place];  Service: Orthopedics;  Laterality: N/A;  Lumbar 3-4, lumbar 4-5, lumbar 5-sacrum 1 decompression  . parathyroid adenoma removed    . POLYPECTOMY    . ROTATOR CUFF REPAIR     bilateral  . TONSILLECTOMY    . UPPER GASTROINTESTINAL ENDOSCOPY       reports that he has been smoking cigarettes.  He has a 87.00 pack-year smoking history. he has never used smokeless tobacco. He reports that he drinks  about 8.4 oz of alcohol per week. He reports that he does not use drugs.  Allergies  Allergen Reactions  . Ivp Dye [Iodinated Diagnostic Agents] Hives and Shortness Of Breath    reaction was when pt was 77 years old.  Clancy Gourd [Nitrofurantoin] Other (See Comments)    Fever that lasted several months    Family History  Problem Relation Age of Onset  . Stroke Mother   . Rheum arthritis Mother   . Heart disease Father   . Colon cancer Unknown        ? mat. uncle died age 87  . Colon polyps Neg Hx   . Esophageal cancer Neg Hx   . Rectal cancer Neg Hx   . Stomach cancer Neg Hx     Prior to Admission medications   Medication Sig Start Date End Date Taking? Authorizing Provider  acetaminophen (TYLENOL) 500 MG tablet Take 1,000 mg by mouth every 6 (six) hours as needed (for back pain.).   Yes [provider]  albuterol (PROVENTIL HFA;VENTOLIN HFA) 108 (90 BASE) MCG/ACT inhaler Inhale 2 puffs into the lungs every 6 (six) hours as needed for wheezing or shortness of breath. Reported on 11/29/2015   Yes [provider]  atorvastatin (LIPITOR) 80 MG tablet Take 80 mg by mouth daily.    Yes [provider]  chlordiazePOXIDE (LIBRIUM) 25 MG capsule Take 25 mg po 4 Times 09/18/17; Then take 25 mg po TID 09/19/17; Then take 25 mg po BID 09/20/17; Then take 25 mg po Daily x 1 Spear on 09/21/17 09/18/17  Yes Sheikh, Bland, DO  clopidogrel (PLAVIX) 75 MG tablet Take 75 mg by mouth daily.   Yes [provider]  DULoxetine (CYMBALTA) 60 MG capsule Take 60 mg by mouth daily.   Yes [provider]  fenofibrate (TRICOR) 145 MG tablet Take 145 mg by mouth daily.    Yes [provider]  folic acid (FOLVITE) 1 MG tablet Take 1 tablet (1 mg total) by mouth daily. 09/19/17  Yes Sheikh, Omair Latif, DO  HYDROcodone-acetaminophen (NORCO/VICODIN) 5-325 MG tablet Take 1 tablet by mouth 2 (two) times daily.   Yes [provider]  hydrOXYzine  (ATARAX/VISTARIL) 25 MG tablet Take 1 tablet (25 mg total) by mouth every 6 (six) hours as needed for anxiety (or CIWA score </= 10). 09/18/17  Yes Sheikh, Omair Latif, DO  loperamide (IMODIUM) 2 MG capsule Take 1-2 capsules (2-4 mg total) by mouth as needed for diarrhea or loose stools. 09/18/17  Yes Sheikh, Omair Latif, DO  metoprolol succinate (TOPROL-XL) 50 MG 24 hr tablet Take 1 tablet (50 mg total) by mouth daily. Take with or immediately following a meal. 09/19/17  Yes Duke, Tami Lin, PA  Multiple  Vitamin (MULTIVITAMIN WITH MINERALS) TABS tablet Take 1 tablet by mouth daily. 09/19/17  Yes Sheikh, Omair Latif, DO  MYRBETRIQ 50 MG TB24 tablet Take 50 mg by mouth daily.  05/03/15  Yes [provider]  NITROSTAT 0.4 MG SL tablet Place 0.4 mg under the tongue every 5 (five) minutes as needed for chest pain. Reported on 11/29/2015 05/29/13  Yes [provider]  omeprazole (PRILOSEC) 40 MG capsule TAKE ONE CAPSULE BY MOUTH TWICE DAILY 04/22/16  Yes Armbruster, Carlota Raspberry, MD  SYMBICORT 160-4.5 MCG/ACT inhaler INHALE TWO PUFFS INTO LUNGS TWICE DAILY 04/19/14  Yes Clance, Armando Reichert, MD  tadalafil (CIALIS) 5 MG tablet Take 5 mg by mouth daily. For BPH   Yes [provider]  thiamine 100 MG tablet Take 1 tablet (100 mg total) by mouth daily. 09/19/17  Yes Sheikh, Omair Latif, DO  tiZANidine (ZANAFLEX) 4 MG capsule Take 4 mg by mouth 3 (three) times daily as needed for muscle spasms.    Yes [provider]  VOLTAREN 1 % GEL Apply 4 g topically 4 (four) times daily as needed for pain. 09/11/17  Yes [provider]    Physical Exam: Vitals:   09/20/17 2041 09/21/17 0020  BP: 124/73 107/69  Pulse: 65 73  Resp: 11 20  Temp: (!) 97.5 F (36.4 C)   TempSrc: Oral   SpO2: 99% 97%      Constitutional: NAD, calm, comfortable Vitals:   09/20/17 2041 09/21/17 0020  BP: 124/73 107/69  Pulse: 65 73  Resp: 11 20  Temp: (!) 97.5 F (36.4 C)   TempSrc: Oral   SpO2:  99% 97%   Eyes: PERRL, lids and conjunctivae normal ENMT: Mucous membranes are moist. Posterior pharynx clear of any exudate or lesions.Normal dentition.  Neck: normal, supple, no masses, no thyromegaly Respiratory: clear to auscultation bilaterally, no wheezing, no crackles. Normal respiratory effort. No accessory muscle use.  Cardiovascular: Regular rate and rhythm, no murmurs / rubs / gallops. No extremity edema. 2+ pedal pulses. No carotid bruits.  Abdomen: no tenderness, no masses palpated. No hepatosplenomegaly. Bowel sounds positive.  Musculoskeletal: no clubbing / cyanosis. No joint deformity upper and lower extremities. Good ROM, no contractures. Normal muscle tone.  Skin: no rashes, lesions, ulcers. No induration Neurologic: CN 2-12 grossly intact. Sensation intact, DTR normal. Strength 5/5 in all 4.  Psychiatric: Patient is alert but angry as well as confused in and out.   Labs on Admission: I have personally reviewed following labs and imaging studies  CBC: Recent Labs  Lab 09/14/17 2344 09/15/17 0015 09/16/17 0917 09/17/17 0857 09/18/17 0836 09/20/17 2202  WBC 6.0  --  8.0 8.9 6.8 6.7  NEUTROABS  --   --  5.7 5.7 4.3 4.6  HGB 11.8* 13.6 10.6* 11.0* 11.1* 10.4*  HCT 32.7* 40.0 29.7* 31.6* 32.5* 29.5*  MCV 96.5  --  96.7 99.1 100.0 98.7  PLT 300  --  272 272 308 062   Basic Metabolic Panel: Recent Labs  Lab 09/16/17 0418 09/16/17 0917 09/17/17 0857 09/18/17 0836 09/20/17 2202  NA 130* 127* 129* 131* 128*  K 4.1 3.7 3.9 3.9 4.0  CL 94* 94* 95* 96* 96*  CO2 24 23 23 23 22   GLUCOSE 80 83 65 66 72  BUN 19 20 20  24* 45*  CREATININE 1.52* 1.46* 1.25* 1.39* 2.22*  CALCIUM 8.7* 8.6* 8.6* 8.8* 8.5*  MG  --   --  1.0* 1.2*  --   PHOS  --   --  2.9 4.0  --    GFR: Estimated Creatinine Clearance: 25.4 mL/min (A) (by C-G formula based on SCr of 2.22 mg/dL (H)). Liver Function Tests: Recent Labs  Lab 09/17/17 0857 09/18/17 0836 09/20/17 2202  AST 74* 61* 55*    ALT 37 35 32  ALKPHOS 46 43 43  BILITOT 1.1 1.2 0.9  PROT 6.2* 6.4* 6.5  ALBUMIN 3.3* 3.4* 3.4*   No results for input(s): LIPASE, AMYLASE in the last 168 hours. No results for input(s): AMMONIA in the last 168 hours. Coagulation Profile: Recent Labs  Lab 09/20/17 2202  INR 0.93   Cardiac Enzymes: Recent Labs  Lab 09/14/17 2344 09/15/17 0218  TROPONINI 1.35* 3.46*   BNP (last 3 results) No results for input(s): PROBNP in the last 8760 hours. HbA1C: No results for input(s): HGBA1C in the last 72 hours. CBG: No results for input(s): GLUCAP in the last 168 hours. Lipid Profile: No results for input(s): CHOL, HDL, LDLCALC, TRIG, CHOLHDL, LDLDIRECT in the last 72 hours. Thyroid Function Tests: No results for input(s): TSH, T4TOTAL, FREET4, T3FREE, THYROIDAB in the last 72 hours. Anemia Panel: No results for input(s): VITAMINB12, FOLATE, FERRITIN, TIBC, IRON, RETICCTPCT in the last 72 hours. Urine analysis:    Component Value Date/Time   COLORURINE YELLOW 09/20/2017 2202   APPEARANCEUR CLEAR 09/20/2017 2202   LABSPEC 1.016 09/20/2017 2202   PHURINE 5.0 09/20/2017 2202   GLUCOSEU NEGATIVE 09/20/2017 2202   HGBUR NEGATIVE 09/20/2017 2202   BILIRUBINUR NEGATIVE 09/20/2017 2202   KETONESUR 5 (A) 09/20/2017 2202   PROTEINUR NEGATIVE 09/20/2017 2202   UROBILINOGEN 1.0 01/09/2015 1427   NITRITE NEGATIVE 09/20/2017 2202   LEUKOCYTESUR NEGATIVE 09/20/2017 2202   Sepsis Labs: @LABRCNTIP (procalcitonin:4,lacticidven:4) ) Recent Results (from the past 240 hour(s))  Culture, Urine     Status: Abnormal   Collection Time: 09/16/17 10:31 PM  Result Value Ref Range Status   Specimen Description URINE, RANDOM  Final   Special Requests NONE  Final   Culture <10,000 COLONIES/mL INSIGNIFICANT GROWTH (A)  Final   Report Status 09/18/2017 FINAL  Final     Radiological Exams on Admission: No results found.  EKG: Independently reviewed.  Assessment/Plan Principal Problem:    Syncope Active Problems:   Essential hypertension, benign   Cigarette smoker   Takotsubo cardiomyopathy     #1 syncope: Most likely due to dehydration but could also be transient arrhythmia. No history of seizures. Patient will be observed on telemetry. Syncopal workup will be initiated based on protocol. Patient has had recent echocardiogram so may not be repeated. Cardiology may be reconsulted if his enzymes appear to be increasing.  #2 hypertension: Blood pressure was low on admission but improving. Continue to monitor  #3 tobacco abuse: Counseling provided. Nicotine patch given  #4  Takotsubo cardiomyopathy: Monitor on telemetry and consider cardiology consultation if not better  DVT prophylaxis: Lovenox   Code Status: Full   Family Communication: None   Disposition Plan: To be determined  Consults called: none  Admission status: Observation   Severity of Illness: The appropriate patient status for this patient is OBSERVATION. Observation status is judged to be reasonable and necessary in order to provide the required intensity of service to ensure the patient's safety. The patient's presenting symptoms, physical exam findings, and initial radiographic and laboratory data in the context of their medical condition is felt to place them at decreased risk for further clinical deterioration. Furthermore, it is anticipated that the patient will be medically stable for discharge  from the hospital within 2 midnights of admission. The following factors support the patient status of observation.   " The patient's presenting symptoms include syncope. " The physical exam findings include confusion. " The initial radiographic and laboratory data are recent cardiac catheterization.     Barbette Merino MD [facility] Pager [phone]  If 7PM-7AM, please contact night-coverage [email or web address] Password [email or web address]  09/21/2017, 12:45 AM

## 2017-09-21 NOTE — Progress Notes (Signed)
@IPLOG @        PROGRESS NOTE                                                                                                                                                                                                             Patient Demographics:    Brad Turner, is a 77 y.o. male, DOB - 05-24-1941, VOJ:[id number]  Admit date - 09/20/2017   Admitting Physician Elwyn Reach, MD  Outpatient Primary MD for the patient is [name], [name] Him, MD  LOS - 0  Chief Complaint  Patient presents with  . Hypotension       Brief Narrative  Brad Turner is a 77 y.o. male with medical history significant of alcohol abuse and recent diagnosis of Takotsubo cardiomyopathy with ST elevation MI including cardiac catheterization with stenting. Patient was discharged on January 14. He was brought back to the ER today after being found by friends and never completely confused and passed out. They reported patient being unresponsive but patient himself denied that. He is still relatively confused and was asking why he was in the ER. In the ER patient was noted to be confused however minimally elevated troponins. EKG looks normal and no other significant abnormalities. Syncope was suspected to be the main problem but no obvious cause. There was report of CPR being initiated. Patient is hypotensive. Patient is therefore being admitted for syncopal workup and observation.    Subjective:    Brad Turner today has, No headache, No chest pain, No abdominal pain - No Nausea, No new weakness tingling or numbness, No Cough - SOB.     Assessment  & Plan :     1.  Syncope.  Patient was severely orthostatic, appears to be severely dehydrated as well, has a, hold diuretics and offending medications, hydrate with IV fluids, stockings, monitor orthostatics.  Recent echocardiogram noted.  2.  Recent Takotsubo cardiomyopathy.  Her workup included left heart cath which was unremarkable a few weeks ago, supportive  care.  Troponin trend flat.  Pain-free.  Continue beta-blocker, statin and Plavix for secondary prevention.  Had nonocclusive CAD on the catheterization few weeks ago.  3.  Previous smoking and alcohol abuse.  States he has quit everything 3 weeks ago.  Continue to monitor, currently no signs of withdrawal.  Continue folic acid and thiamine supplementation.  4.  Encephalopathy.  Mild delirium now question if he has undiagnosed early dementia, TSH is stable will check B12, no focal deficits, monitor with advance activity.  PT  evaluation.  5.  Dyslipidemia.  On statin and fenofibrate.  6.  Hypertension.  Beta-blocker as tolerated by blood pressure.  7.  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 Recent Takotsubo cardiomyopathy with unremarkable left heart cath 2 weeks ago.  Currently dehydrated, hydrate with IV fluids, hold ACE/ARB due to renal failure, low-dose beta-blocker as tolerated by blood pressure.  No acute issues.  8.  GERD.  On PPI continue.    Diet : Diet Heart Room service appropriate? Yes; Fluid consistency: Thin    Family Communication  : None  Code Status :  Full  Disposition Plan  :  Home 1-2 days  Consults  :  None  Procedures  :  None  DVT Prophylaxis  :  Lovenox   Lab Results  Component Value Date   PLT 343 09/20/2017    Inpatient Medications  Scheduled Meds: . atorvastatin  80 mg Oral Daily  . clopidogrel  75 mg Oral Daily  . docusate sodium  100 mg Oral BID  . DULoxetine  60 mg Oral Daily  . enoxaparin (LOVENOX) injection  30 mg Subcutaneous Q24H  . feeding supplement (ENSURE ENLIVE)  237 mL Oral BID BM  . fenofibrate  160 mg Oral Daily  . fluticasone furoate-vilanterol  1 puff Inhalation Daily  . folic acid  1 mg Oral Daily  . metoprolol succinate  50 mg Oral Daily  . mirabegron ER  50 mg Oral Daily  . multivitamin with minerals  1 tablet Oral Daily  . pantoprazole  40 mg Oral Daily  . sodium chloride  flush  3 mL Intravenous Q12H  . thiamine  100 mg Oral Daily   Continuous Infusions: . sodium chloride 100 mL/hr at 09/21/17 0927   PRN Meds:.acetaminophen, albuterol, diclofenac sodium, hydrOXYzine, loperamide, magnesium citrate, nitroGLYCERIN  Antibiotics  :    Anti-infectives (From admission, onward)   None         Objective:   Vitals:   09/21/17 0517 09/21/17 0704 09/21/17 0809 09/21/17 1034  BP:  117/69  110/60  Pulse:  71  71  Resp:  16    Temp:  98.2 F (36.8 C)    TempSrc:  Oral    SpO2:  97% 93%   Weight: 60.3 kg (132 lb 14.4 oz)     Height:        Wt Readings from Last 3 Encounters:  09/21/17 60.3 kg (132 lb 14.4 oz)  09/17/17 65.2 kg (143 lb 11.8 oz)  08/04/17 61.7 kg (136 lb)     Intake/Output Summary (Last 24 hours) at 09/21/2017 1303 Last data filed at 09/21/2017 1015 Gross per 24 hour  Intake 600 ml  Output 100 ml  Net 500 ml     Physical Exam  Awake Alert, mild intermittent confusion, No new F.N deficits, Normal affect [place].AT,PERRAL Supple Neck,No JVD, No cervical lymphadenopathy appriciated.  Symmetrical Chest wall movement, Good air movement bilaterally, CTAB RRR,No Gallops,Rubs or new Murmurs, No Parasternal Heave +ve B.Sounds, Abd Soft, No tenderness, No organomegaly appriciated, No rebound - guarding or rigidity. No Cyanosis, Clubbing or edema, No new Rash or bruise       Data Review:    CBC Recent Labs  Lab 09/14/17 2344 09/15/17 0015 09/16/17 0917 09/17/17 0857 09/18/17 0836 09/20/17 2202  WBC 6.0  --  8.0 8.9 6.8 6.7  HGB 11.8* 13.6 10.6* 11.0* 11.1* 10.4*  HCT 32.7* 40.0 29.7* 31.6* 32.5* 29.5*  PLT 300  --  272 272 308 343  MCV 96.5  --  96.7 99.1 100.0 98.7  MCH 34.8*  --  34.5* 34.5* 34.2* 34.8*  MCHC 36.1*  --  35.7 34.8 34.2 35.3  RDW 13.8  --  14.0 14.0 13.8 14.0  LYMPHSABS  --   --  1.7 2.3 1.9 1.5  MONOABS  --   --  0.5 0.8 0.6 0.7  EOSABS  --   --  0.0 0.1 0.0 0.0  BASOSABS  --   --  0.0 0.0 0.0 0.0     Chemistries  Recent Labs  Lab 09/16/17 0917 09/17/17 0857 09/18/17 0836 09/20/17 2202 09/21/17 0518  NA 127* 129* 131* 128* 130*  K 3.7 3.9 3.9 4.0 3.8  CL 94* 95* 96* 96* 98*  CO2 23 23 23 22 24   GLUCOSE 83 65 66 72 63*  BUN 20 20 24* 45* 41*  CREATININE 1.46* 1.25* 1.39* 2.22* 1.81*  CALCIUM 8.6* 8.6* 8.8* 8.5* 8.6*  MG  --  1.0* 1.2*  --  1.8  AST  --  74* 61* 55* 49*  ALT  --  37 35 32 31  ALKPHOS  --  46 43 43 45  BILITOT  --  1.1 1.2 0.9 1.2   ------------------------------------------------------------------------------------------------------------------ No results for input(s): CHOL, HDL, LDLCALC, TRIG, CHOLHDL, LDLDIRECT in the last 72 hours.  No results found for: HGBA1C ------------------------------------------------------------------------------------------------------------------ Recent Labs    09/21/17 0518  TSH 2.282   ------------------------------------------------------------------------------------------------------------------ No results for input(s): VITAMINB12, FOLATE, FERRITIN, TIBC, IRON, RETICCTPCT in the last 72 hours.  Coagulation profile Recent Labs  Lab 09/20/17 2202  INR 0.93    No results for input(s): DDIMER in the last 72 hours.  Cardiac Enzymes Recent Labs  Lab 09/15/17 0218 09/21/17 0518 09/21/17 1116  TROPONINI 3.46* 0.13* 0.11*   ------------------------------------------------------------------------------------------------------------------    Component Value Date/Time   BNP 243.7 (H) 09/20/2017 2202    Micro Results Recent Results (from the past 240 hour(s))  Culture, Urine     Status: Abnormal   Collection Time: 09/16/17 10:31 PM  Result Value Ref Range Status   Specimen Description URINE, RANDOM  Final   Special Requests NONE  Final   Culture <10,000 COLONIES/mL INSIGNIFICANT GROWTH (A)  Final   Report Status 09/18/2017 FINAL  Final    Radiology Reports Dg Chest Port 1 View  Result Date:  09/21/2017 CLINICAL DATA:  77 year old male with history of syncope, confusion and recent cardiac catheterization. EXAM: PORTABLE CHEST 1 VIEW COMPARISON:  Chest x-ray 09/15/2017. FINDINGS: Lung volumes are normal. No consolidative airspace disease. No pleural effusions. No pneumothorax. No suspicious appearing pulmonary nodules or masses. Pulmonary vasculature and the cardiomediastinal silhouette are within normal limits. Aortic atherosclerosis. IMPRESSION: 1. No radiographic evidence of acute cardiopulmonary disease. 2. Aortic atherosclerosis. Electronically Signed   By: Vinnie Langton M.D.   On: 09/21/2017 08:44   Dg Chest Portable 1 View  Result Date: 09/15/2017 CLINICAL DATA:  Weakness, LEFT chest pain. History of pericarditis with similar symptoms. EXAM: PORTABLE CHEST 1 VIEW COMPARISON:  CT chest August 07, 2017. FINDINGS: Cardiac silhouette is upper limits of normal in size, mediastinal silhouette is nonsuspicious. Mildly calcified aortic knob. Mild hyperinflation. No pleural effusion or focal consolidation. No pneumothorax. Surgical clips LEFT neck compatible with thyroidectomy. Soft tissue planes and included osseous structures are nonsuspicious. Multiple EKG lines overlie the patient and may obscure subtle underlying pathology. IMPRESSION: Borderline cardiomegaly.  No acute pulmonary process. Aortic Atherosclerosis (ICD10-I70.0). Electronically Signed   By:  Elon Alas M.D.   On: 09/15/2017 00:22    Time Spent in minutes  30   Lala Lund M.D on 09/21/2017 at 1:03 PM  Between 7am to 7pm - Pager - [phone] ( page via [email or web address], text pages only, please mention full 10 digit call back number). After 7pm go to [email or web address] - password [email or web address]

## 2017-09-21 NOTE — Progress Notes (Addendum)
CRITICAL VALUE ALERT  Critical Value:  Trop 0.13  Date & Time Notied:  09/20/17 0715  Provider Notified: Candiss Norse  Orders Received/Actions taken: MD paged. Oncoming nurse aware.

## 2017-09-21 NOTE — Progress Notes (Signed)
Hypoglycemic Event  CBG: 55   Treatment: 15 GM carbohydrate snack  Symptoms: None  Follow-up CBG: Time 0610    CBG Result:79  Possible Reasons for Event: Inadequate meal intake  Comments/MD notified:hypoglycemic nurse driven protocol initiated     Magaly Pollina, Sherryll Burger

## 2017-09-22 DIAGNOSIS — E785 Hyperlipidemia, unspecified: Secondary | ICD-10-CM | POA: Diagnosis present

## 2017-09-22 DIAGNOSIS — F329 Major depressive disorder, single episode, unspecified: Secondary | ICD-10-CM | POA: Diagnosis present

## 2017-09-22 DIAGNOSIS — Z8261 Family history of arthritis: Secondary | ICD-10-CM | POA: Diagnosis not present

## 2017-09-22 DIAGNOSIS — K2270 Barrett's esophagus without dysplasia: Secondary | ICD-10-CM | POA: Diagnosis present

## 2017-09-22 DIAGNOSIS — I2510 Atherosclerotic heart disease of native coronary artery without angina pectoris: Secondary | ICD-10-CM | POA: Diagnosis present

## 2017-09-22 DIAGNOSIS — R0602 Shortness of breath: Secondary | ICD-10-CM | POA: Diagnosis not present

## 2017-09-22 DIAGNOSIS — M549 Dorsalgia, unspecified: Secondary | ICD-10-CM | POA: Diagnosis present

## 2017-09-22 DIAGNOSIS — I110 Hypertensive heart disease with heart failure: Secondary | ICD-10-CM | POA: Diagnosis present

## 2017-09-22 DIAGNOSIS — Z888 Allergy status to other drugs, medicaments and biological substances status: Secondary | ICD-10-CM | POA: Diagnosis not present

## 2017-09-22 DIAGNOSIS — Z87442 Personal history of urinary calculi: Secondary | ICD-10-CM | POA: Diagnosis not present

## 2017-09-22 DIAGNOSIS — I10 Essential (primary) hypertension: Secondary | ICD-10-CM | POA: Diagnosis not present

## 2017-09-22 DIAGNOSIS — G629 Polyneuropathy, unspecified: Secondary | ICD-10-CM | POA: Diagnosis present

## 2017-09-22 DIAGNOSIS — Z91041 Radiographic dye allergy status: Secondary | ICD-10-CM | POA: Diagnosis not present

## 2017-09-22 DIAGNOSIS — I5181 Takotsubo syndrome: Secondary | ICD-10-CM | POA: Diagnosis not present

## 2017-09-22 DIAGNOSIS — F05 Delirium due to known physiological condition: Secondary | ICD-10-CM | POA: Diagnosis present

## 2017-09-22 DIAGNOSIS — Z85828 Personal history of other malignant neoplasm of skin: Secondary | ICD-10-CM | POA: Diagnosis not present

## 2017-09-22 DIAGNOSIS — R4189 Other symptoms and signs involving cognitive functions and awareness: Secondary | ICD-10-CM | POA: Diagnosis present

## 2017-09-22 DIAGNOSIS — J439 Emphysema, unspecified: Secondary | ICD-10-CM | POA: Diagnosis present

## 2017-09-22 DIAGNOSIS — N179 Acute kidney failure, unspecified: Secondary | ICD-10-CM | POA: Diagnosis present

## 2017-09-22 DIAGNOSIS — Z86010 Personal history of colonic polyps: Secondary | ICD-10-CM | POA: Diagnosis not present

## 2017-09-22 DIAGNOSIS — G8929 Other chronic pain: Secondary | ICD-10-CM | POA: Diagnosis present

## 2017-09-22 DIAGNOSIS — I5042 Chronic combined systolic (congestive) and diastolic (congestive) heart failure: Secondary | ICD-10-CM | POA: Diagnosis present

## 2017-09-22 DIAGNOSIS — R748 Abnormal levels of other serum enzymes: Secondary | ICD-10-CM | POA: Diagnosis present

## 2017-09-22 DIAGNOSIS — E860 Dehydration: Secondary | ICD-10-CM | POA: Diagnosis present

## 2017-09-22 DIAGNOSIS — K219 Gastro-esophageal reflux disease without esophagitis: Secondary | ICD-10-CM | POA: Diagnosis present

## 2017-09-22 DIAGNOSIS — G9340 Encephalopathy, unspecified: Secondary | ICD-10-CM | POA: Diagnosis present

## 2017-09-22 DIAGNOSIS — R55 Syncope and collapse: Secondary | ICD-10-CM | POA: Diagnosis not present

## 2017-09-22 DIAGNOSIS — E440 Moderate protein-calorie malnutrition: Secondary | ICD-10-CM | POA: Diagnosis not present

## 2017-09-22 DIAGNOSIS — I951 Orthostatic hypotension: Secondary | ICD-10-CM | POA: Diagnosis present

## 2017-09-22 LAB — BASIC METABOLIC PANEL
ANION GAP: 6 (ref 5–15)
Anion gap: 7 (ref 5–15)
BUN: 26 mg/dL — AB (ref 6–20)
BUN: 24 mg/dL — ABNORMAL HIGH (ref 6–20)
CALCIUM: 8.5 mg/dL — AB (ref 8.9–10.3)
CHLORIDE: 101 mmol/L (ref 101–111)
CO2: 23 mmol/L (ref 22–32)
CO2: 24 mmol/L (ref 22–32)
CREATININE: 1.1 mg/dL (ref 0.61–1.24)
Calcium: 8.7 mg/dL — ABNORMAL LOW (ref 8.9–10.3)
Chloride: 102 mmol/L (ref 101–111)
Creatinine, Ser: 1.05 mg/dL (ref 0.61–1.24)
GFR calc Af Amer: >60 mL/min (ref >60)
GFR calc Af Amer: >60 mL/min (ref >60)
GFR calc non Af Amer: >60 mL/min (ref >60)
GFR calc non Af Amer: >60 mL/min (ref >60)
GLUCOSE: 81 mg/dL (ref 65–99)
GLUCOSE: 104 mg/dL — AB (ref 65–99)
POTASSIUM: 4.3 mmol/L (ref 3.5–5.1)
Potassium: 4 mmol/L (ref 3.5–5.1)
SODIUM: 131 mmol/L — AB (ref 135–145)
Sodium: 132 mmol/L — ABNORMAL LOW (ref 135–145)

## 2017-09-22 LAB — GLUCOSE, CAPILLARY
GLUCOSE-CAPILLARY: 97 mg/dL (ref 65–99)
Glucose-Capillary: 69 mg/dL (ref 65–99)

## 2017-09-22 LAB — MAGNESIUM: MAGNESIUM: 1.7 mg/dL (ref 1.7–2.4)

## 2017-09-22 MED ORDER — ENOXAPARIN SODIUM 40 MG/0.4ML ~~LOC~~ SOLN
40.0000 mg | SUBCUTANEOUS | Status: DC
  Administered 2017-09-22 – 2017-09-25 (×4): 40 mg via SUBCUTANEOUS
  Filled 2017-09-22 (×4): qty 0.4

## 2017-09-22 MED ORDER — CYANOCOBALAMIN 1000 MCG/ML IJ SOLN
1000.0000 ug | Freq: Every day | INTRAMUSCULAR | Status: DC
  Administered 2017-09-22 – 2017-09-25 (×4): 1000 ug via SUBCUTANEOUS
  Filled 2017-09-22 (×4): qty 1

## 2017-09-22 MED ORDER — LACTATED RINGERS IV SOLN
INTRAVENOUS | Status: DC
Start: 2017-09-22 — End: 2017-09-22

## 2017-09-22 MED ORDER — SODIUM CHLORIDE 0.9 % IV SOLN
INTRAVENOUS | Status: DC
  Administered 2017-09-22 – 2017-09-23 (×2): via INTRAVENOUS

## 2017-09-22 MED ORDER — MIDODRINE HCL 2.5 MG PO TABS
2.5000 mg | ORAL_TABLET | Freq: Three times a day (TID) | ORAL | Status: DC
  Filled 2017-09-22: qty 1

## 2017-09-22 MED ORDER — LACTATED RINGERS IV SOLN
INTRAVENOUS | Status: AC
Start: 2017-09-22 — End: 2017-09-22
  Administered 2017-09-22: 10:00:00 via INTRAVENOUS

## 2017-09-22 NOTE — Progress Notes (Signed)
@IPLOG @        PROGRESS NOTE                                                                                                                                                                                                             Patient Demographics:    Brad Turner, is a 77 y.o. male, DOB - 28-Jan-1941, YQI:[id number]  Admit date - 09/20/2017   Admitting Physician Elwyn Reach, MD  Outpatient Primary MD for the patient is [name], [name] Him, MD  LOS - 0  Chief Complaint  Patient presents with  . Hypotension       Brief Narrative  Brad Turner is a 77 y.o. male with medical history significant of alcohol abuse and recent diagnosis of Takotsubo cardiomyopathy with ST elevation MI including cardiac catheterization with stenting. Patient was discharged on January 14. He was brought back to the ER today after being found by friends and never completely confused and passed out. They reported patient being unresponsive but patient himself denied that. He is still relatively confused and was asking why he was in the ER. In the ER patient was noted to be confused however minimally elevated troponins. EKG looks normal and no other significant abnormalities. Syncope was suspected to be the main problem but no obvious cause. There was report of CPR being initiated. Patient is hypotensive. Patient is therefore being admitted for syncopal workup and observation.    Subjective:   Patient in bed, appears comfortable, denies any headache, no fever, no chest pain or pressure, no shortness of breath , no abdominal pain. No focal weakness.   Assessment  & Plan :     1.  Syncope.  Patient was severely orthostatic, appears to be severely dehydrated as well, continue to hold his diuretics and offending medications, continue hydration with IV fluids, TED stockings, orthostatics improving, advance activity, PT evaluation, echocardiogram from a few weeks ago evaluated and noted.  2.  Recent Takotsubo  cardiomyopathy.  Her workup included left heart cath which was unremarkable a few weeks ago, supportive care.  Troponin trend flat.  Pain-free.  Continue beta-blocker, statin and Plavix for secondary prevention.  Had nonocclusive CAD on the catheterization few weeks ago.  3.  Previous smoking and alcohol abuse.  States he has quit everything 3 weeks ago.  Continue to monitor, currently no signs of withdrawal.  Continue folic acid and thiamine supplementation.  4.  Encephalopathy.  Mild delirium now question if he has undiagnosed early dementia, TSH is stable B12 is  borderline will start replacement, no focal deficits, monitor with advance activity.  PT evaluation.  5.  Dyslipidemia.  On statin and fenofibrate.  6.  Hypertension.  Beta-blocker as tolerated by blood pressure.  7.  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 Recent Takotsubo cardiomyopathy with unremarkable left heart cath 2 weeks ago.  Currently dehydrated, hydrate with IV fluids, hold ACE/ARB due to renal failure, low-dose beta-blocker as tolerated by blood pressure.  No acute issues.  8.  GERD.  On PPI continue.  9.  Mild delirium.  Could have undiagnosed early dementia, avoid benzodiazepines and narcotics, held on if gets agitated.  Currently still oriented x3, up in the chair, PT eval, exposed to sunlight.    Diet : Diet Heart Room service appropriate? Yes; Fluid consistency: Thin    Family Communication  : None  Code Status :  Full  Disposition Plan  :  Home 1-2 days  Consults  :  None  Procedures  :  None  DVT Prophylaxis  :  Lovenox   Lab Results  Component Value Date   PLT 343 09/20/2017    Inpatient Medications  Scheduled Meds: . atorvastatin  80 mg Oral Daily  . clopidogrel  75 mg Oral Daily  . cyanocobalamin  1,000 mcg Subcutaneous Daily  . docusate sodium  100 mg Oral BID  . DULoxetine  60 mg Oral Daily  . enoxaparin (LOVENOX) injection  40 mg Subcutaneous  Q24H  . feeding supplement (ENSURE ENLIVE)  237 mL Oral BID BM  . fenofibrate  160 mg Oral Daily  . fluticasone furoate-vilanterol  1 puff Inhalation Daily  . folic acid  1 mg Oral Daily  . metoprolol succinate  25 mg Oral Daily  . mirabegron ER  50 mg Oral Daily  . multivitamin with minerals  1 tablet Oral Daily  . pantoprazole  40 mg Oral Daily  . sodium chloride flush  3 mL Intravenous Q12H  . thiamine  100 mg Oral Daily   Continuous Infusions: . lactated ringers 250 mL/hr at 09/22/17 0943   PRN Meds:.acetaminophen, albuterol, hydrOXYzine, loperamide, magnesium citrate, nitroGLYCERIN  Antibiotics  :    Anti-infectives (From admission, onward)   None         Objective:   Vitals:   09/21/17 2139 09/22/17 0617 09/22/17 0700 09/22/17 1015  BP: 128/73     Pulse: 68     Resp: 16 16    Temp: 99.3 F (37.4 C) 98 F (36.7 C)    TempSrc: Oral Oral    SpO2: 99% 94%  95%  Weight:   60.9 kg (134 lb 4.2 oz)   Height:        Wt Readings from Last 3 Encounters:  09/22/17 60.9 kg (134 lb 4.2 oz)  09/17/17 65.2 kg (143 lb 11.8 oz)  08/04/17 61.7 kg (136 lb)     Intake/Output Summary (Last 24 hours) at 09/22/2017 1148 Last data filed at 09/22/2017 0811 Gross per 24 hour  Intake 1055 ml  Output 525 ml  Net 530 ml     Physical Exam  Awake Alert, Oriented X 3, does get intermittently confused, No new F.N deficits, Normal affect [place].AT,PERRAL Supple Neck,No JVD, No cervical lymphadenopathy appriciated.  Symmetrical Chest wall movement, Good air movement bilaterally, CTAB RRR,No Gallops, Rubs or new Murmurs, No Parasternal Heave +ve B.Sounds, Abd Soft, No tenderness, No organomegaly appriciated, No rebound - guarding or rigidity. No Cyanosis, Clubbing or edema, No new  Rash or bruise    Data Review:    CBC Recent Labs  Lab 09/16/17 0917 09/17/17 0857 09/18/17 0836 09/20/17 2202  WBC 8.0 8.9 6.8 6.7  HGB 10.6* 11.0* 11.1* 10.4*  HCT 29.7* 31.6* 32.5* 29.5*  PLT  272 272 308 343  MCV 96.7 99.1 100.0 98.7  MCH 34.5* 34.5* 34.2* 34.8*  MCHC 35.7 34.8 34.2 35.3  RDW 14.0 14.0 13.8 14.0  LYMPHSABS 1.7 2.3 1.9 1.5  MONOABS 0.5 0.8 0.6 0.7  EOSABS 0.0 0.1 0.0 0.0  BASOSABS 0.0 0.0 0.0 0.0    Chemistries  Recent Labs  Lab 09/17/17 0857 09/18/17 0836 09/20/17 2202 09/21/17 0518 09/22/17 0529 09/22/17 1047  NA 129* 131* 128* 130* 131* 132*  K 3.9 3.9 4.0 3.8 4.0 4.3  CL 95* 96* 96* 98* 102 101  CO2 23 23 22 24 23 24   GLUCOSE 65 66 72 63* 81 104*  BUN 20 24* 45* 41* 26* 24*  CREATININE 1.25* 1.39* 2.22* 1.81* 1.05 1.10  CALCIUM 8.6* 8.8* 8.5* 8.6* 8.5* 8.7*  MG 1.0* 1.2*  --  1.8 1.7  --   AST 74* 61* 55* 49*  --   --   ALT 37 35 32 31  --   --   ALKPHOS 46 43 43 45  --   --   BILITOT 1.1 1.2 0.9 1.2  --   --    ------------------------------------------------------------------------------------------------------------------ No results for input(s): CHOL, HDL, LDLCALC, TRIG, CHOLHDL, LDLDIRECT in the last 72 hours.  No results found for: HGBA1C ------------------------------------------------------------------------------------------------------------------ Recent Labs    09/21/17 0518  TSH 2.282   ------------------------------------------------------------------------------------------------------------------ Recent Labs    09/21/17 1116  VITAMINB12 279    Coagulation profile Recent Labs  Lab 09/20/17 2202  INR 0.93    No results for input(s): DDIMER in the last 72 hours.  Cardiac Enzymes Recent Labs  Lab 09/21/17 0518 09/21/17 1116 09/21/17 1533  TROPONINI 0.13* 0.11* 0.11*   ------------------------------------------------------------------------------------------------------------------    Component Value Date/Time   BNP 243.7 (H) 09/20/2017 2202    Micro Results Recent Results (from the past 240 hour(s))  Culture, Urine     Status: Abnormal   Collection Time: 09/16/17 10:31 PM  Result Value Ref Range  Status   Specimen Description URINE, RANDOM  Final   Special Requests NONE  Final   Culture <10,000 COLONIES/mL INSIGNIFICANT GROWTH (A)  Final   Report Status 09/18/2017 FINAL  Final    Radiology Reports Dg Chest Port 1 View  Result Date: 09/21/2017 CLINICAL DATA:  77 year old male with history of syncope, confusion and recent cardiac catheterization. EXAM: PORTABLE CHEST 1 VIEW COMPARISON:  Chest x-ray 09/15/2017. FINDINGS: Lung volumes are normal. No consolidative airspace disease. No pleural effusions. No pneumothorax. No suspicious appearing pulmonary nodules or masses. Pulmonary vasculature and the cardiomediastinal silhouette are within normal limits. Aortic atherosclerosis. IMPRESSION: 1. No radiographic evidence of acute cardiopulmonary disease. 2. Aortic atherosclerosis. Electronically Signed   By: Vinnie Langton M.D.   On: 09/21/2017 08:44   Dg Chest Portable 1 View  Result Date: 09/15/2017 CLINICAL DATA:  Weakness, LEFT chest pain. History of pericarditis with similar symptoms. EXAM: PORTABLE CHEST 1 VIEW COMPARISON:  CT chest August 07, 2017. FINDINGS: Cardiac silhouette is upper limits of normal in size, mediastinal silhouette is nonsuspicious. Mildly calcified aortic knob. Mild hyperinflation. No pleural effusion or focal consolidation. No pneumothorax. Surgical clips LEFT neck compatible with thyroidectomy. Soft tissue planes and included osseous structures are nonsuspicious. Multiple EKG lines overlie  the patient and may obscure subtle underlying pathology. IMPRESSION: Borderline cardiomegaly.  No acute pulmonary process. Aortic Atherosclerosis (ICD10-I70.0). Electronically Signed   By: Elon Alas M.D.   On: 09/15/2017 00:22    Time Spent in minutes  30   Lala Lund M.D on 09/22/2017 at 11:48 AM  Between 7am to 7pm - Pager - [phone] ( page via [email or web address], text pages only, please mention full 10 digit call back number). After 7pm go to [email or web address] - password  [facility]

## 2017-09-22 NOTE — Care Management Note (Signed)
Case Management Note  Patient Details  Name: Brad Turner MRN: [id number] Date of Birth: 1941-08-30  Subjective/Objective: 77 y/o m admitted w/syncope. From home.                   Action/Plan:d/c plan home.   Expected Discharge Date:                  Expected Discharge Plan:  Home/Self Care  In-House Referral:     Discharge planning Services  CM Consult  Post Acute Care Choice:    Choice offered to:     DME Arranged:    DME Agency:     HH Arranged:    HH Agency:     Status of Service:  In process, will continue to follow  If discussed at Long Length of Stay Meetings, dates discussed:    Additional Comments:  Dessa Phi, RN 09/22/2017, 12:03 PM

## 2017-09-22 NOTE — Evaluation (Signed)
Physical Therapy Evaluation Patient Details Name: Brad Turner MRN: [id number] DOB: 1941-07-12 Today's Date: 09/22/2017   History of Present Illness  Pt is a 77 y/o male with a PMH significant of CAD s/p multiple POBA, HTN, HLD, COPD, pericarditis, alcohol abuse and recent admission and diagnosis of Takotsubo cardiomyopathy with ST elevation MI including cardiac catheterization with stenting and admitted 09/20/17 for syncopal work up  Clinical Impression  Pt admitted with above diagnosis. Pt currently with functional limitations due to the deficits listed below (see PT Problem List).  Pt will benefit from skilled PT to increase their independence and safety with mobility to allow discharge to the venue listed below.  Pt assisted with ambulating short distance in hallway however he fatigues quickly.  Pt appears confused today and wishes to d/c back home.  Pt with recent admission which SNF was recommended however pt declined.  Pt would benefit from SNF if agreeable.     Follow Up Recommendations SNF;Supervision/Assistance - 24 hour    Equipment Recommendations  Rolling walker with 5" wheels;3in1 (PT)(may have these from recent admission)    Recommendations for Other Services       Precautions / Restrictions Precautions Precautions: Fall Restrictions Weight Bearing Restrictions: No      Mobility  Bed Mobility               General bed mobility comments: pt up in recliner on arrival  Transfers Overall transfer level: Needs assistance Equipment used: Rolling walker (2 wheeled) Transfers: Sit to/from Stand Sit to Stand: Min assist         General transfer comment: assist to rise and steady, posterior bias initially, verbal cues for safe technique; performed with a little less assist the second sit to stand from recliner  Ambulation/Gait Ambulation/Gait assistance: Min assist;+2 safety/equipment Ambulation Distance (Feet): 27 Feet(total) Assistive device: Rolling walker  (2 wheeled) Gait Pattern/deviations: Step-through pattern;Decreased stride length;Shuffle;Trunk flexed Gait velocity: Decreased    General Gait Details: verbal cues for RW positioning and posture, tends to keep RW too far forward, seated rest break after 7 feet as pt confused and poor historian; orthostatic this morning however BP 142/79 mmHg, HR 67bpm during rest break, pt ambulated another 20 feet, distance to tolerance; recliner followed for safety  Stairs            Wheelchair Mobility    Modified Rankin (Stroke Patients Only)       Balance Overall balance assessment: Needs assistance         Standing balance support: During functional activity;Bilateral upper extremity supported Standing balance-Leahy Scale: Poor Standing balance comment: requires UE support for balance.                              Pertinent Vitals/Pain Pain Assessment: Faces Faces Pain Scale: Hurts little more Pain Intervention(s): Repositioned;Monitored during session    [place] expects to be discharged to:: Private residence Living Arrangements: Alone Available Help at Discharge: Family;Available PRN/intermittently Type of Home: House Home Access: Stairs to enter Entrance Stairs-Rails: None Entrance Stairs-Number of Steps: 2 Home Layout: Two level;Bed/bath upstairs;1/2 bath on main level Home Equipment: Shower seat      Prior Function Level of Independence: Independent         Comments: pt seems slightly confused today, information from recent admission     Hand Dominance   Dominant Hand: Right    Extremity/Trunk Assessment  Lower Extremity Assessment Lower Extremity Assessment: Generalized weakness    Cervical / Trunk Assessment Cervical / Trunk Assessment: Other exceptions Cervical / Trunk Exceptions: Forward head and rounded shoulder posture observed  Communication   Communication: No difficulties  Cognition Arousal/Alertness:  Awake/alert Behavior During Therapy: Flat affect Overall Cognitive Status: Impaired/Different from baseline Area of Impairment: Memory;Following commands;Safety/judgement;Problem solving                     Memory: Decreased short-term memory Following Commands: Follows one step commands with increased time Safety/Judgement: Decreased awareness of safety;Decreased awareness of deficits   Problem Solving: Difficulty sequencing;Requires verbal cues;Requires tactile cues General Comments: Pt very unaware of deficits, reports just wanting to d/c home today despite limited mobility      General Comments      Exercises     Assessment/Plan    PT Assessment Patient needs continued PT services  PT Problem List Decreased strength;Decreased range of motion;Decreased activity tolerance;Decreased balance;Decreased mobility;Decreased safety awareness;Decreased knowledge of use of DME;Decreased knowledge of precautions       PT Treatment Interventions DME instruction;Gait training;Stair training;Functional mobility training;Therapeutic activities;Therapeutic exercise;Neuromuscular re-education;Patient/family education;Balance training    PT Goals (Current goals can be found in the Care Plan section)  Acute Rehab PT Goals PT Goal Formulation: With patient Time For Goal Achievement: 10/06/17 Potential to Achieve Goals: Good    Frequency Min 3X/week   Barriers to discharge        Co-evaluation               AM-PAC PT "6 Clicks" Daily Activity  Outcome Measure Difficulty turning over in bed (including adjusting bedclothes, sheets and blankets)?: A Lot Difficulty moving from lying on back to sitting on the side of the bed? : A Lot Difficulty sitting down on and standing up from a chair with arms (e.g., wheelchair, bedside commode, etc,.)?: Unable Help needed moving to and from a bed to chair (including a wheelchair)?: A Little Help needed walking in hospital room?: A  Little Help needed climbing 3-5 steps with a railing? : A Lot 6 Click Score: 13    End of Session Equipment Utilized During Treatment: Gait belt Activity Tolerance: Patient limited by fatigue Patient left: in chair;with chair alarm set;with call bell/phone within reach Nurse Communication: Mobility status PT Visit Diagnosis: Unsteadiness on feet (R26.81);Difficulty in walking, not elsewhere classified (R26.2)    Time: 4967-5916 PT Time Calculation (min) (ACUTE ONLY): 21 min   Charges:   PT Evaluation $PT Eval Moderate Complexity: 1 Mod     PT G Codes:        Carmelia Bake, PT, DPT 09/22/2017 Pager: [phone]  York Ram E 09/22/2017, 12:35 PM

## 2017-09-22 NOTE — Plan of Care (Signed)
  Clinical Measurements: Cardiovascular complication will be avoided 09/22/2017 0442 - Progressing by Ashley Murrain, RN

## 2017-09-22 NOTE — Evaluation (Signed)
Occupational Therapy Evaluation Patient Details Name: Brad Turner MRN: [id number] DOB: 1941-02-08 Today's Date: 09/22/2017    History of Present Illness Pt is a 77 y/o male with a PMH significant of CAD s/p multiple POBA, HTN, HLD, COPD, pericarditis, alcohol abuse and recent admission and diagnosis of Takotsubo cardiomyopathy with ST elevation MI including cardiac catheterization with stenting and admitted 09/20/17 for syncopal work up   Clinical Impression   Pt was admitted for the above.  Unsure of PLOF; pt is not a good historian and he answers are very vague. Will follow in acute setting with supervision level goals.  Pt needs mostly min A at this time for basic adls    Follow Up Recommendations  SNF;Supervision/Assistance - 24 hour    Equipment Recommendations  None recommended by OT(likely)    Recommendations for Other Services       Precautions / Restrictions Precautions Precautions: Fall Restrictions Weight Bearing Restrictions: No      Mobility Bed Mobility               General bed mobility comments: up in recliner  Transfers Overall transfer level: Needs assistance Equipment used: Rolling walker (2 wheeled) Transfers: Sit to/from Stand Sit to Stand: Min guard         General transfer comment: for safety    Balance Overall balance assessment: Needs assistance         Standing balance support: During functional activity;Bilateral upper extremity supported Standing balance-Leahy Scale: Poor Standing balance comment: requires UE support for balance.                            ADL either performed or assessed with clinical judgement   ADL Overall ADL's : Needs assistance/impaired Eating/Feeding: Set up;Sitting   Grooming: Set up;Sitting;Supervision/safety   Upper Body Bathing: Set up;Supervision/ safety;Sitting   Lower Body Bathing: Minimal assistance;Sit to/from stand   Upper Body Dressing : Minimal assistance;Sitting(iv)    Lower Body Dressing: Moderate assistance;Sit to/from stand                 General ADL Comments: Min guard for sit to stand.  Pt very focused on leaving today     Vision         Perception     Praxis      Pertinent Vitals/Pain Pain Assessment: No/denies pain Faces Pain Scale: Hurts little more Pain Intervention(s): Repositioned;Monitored during session     Hand Dominance Right   Extremity/Trunk Assessment Upper Extremity Assessment Upper Extremity Assessment: Generalized weakness   Lower Extremity Assessment Lower Extremity Assessment: Generalized weakness   Cervical / Trunk Assessment Cervical / Trunk Assessment: Other exceptions Cervical / Trunk Exceptions: Forward head and rounded shoulder posture observed   Communication Communication Communication: No difficulties   Cognition Arousal/Alertness: Awake/alert Behavior During Therapy: Flat affect Overall Cognitive Status: Impaired/Different from baseline Area of Impairment: Memory;Following commands;Safety/judgement;Problem solving                     Memory: Decreased short-term memory Following Commands: Follows one step commands with increased time Safety/Judgement: Decreased awareness of safety;Decreased awareness of deficits   Problem Solving: Difficulty sequencing;Requires verbal cues;Requires tactile cues General Comments: Pt very unaware of deficits, reports just wanting to d/c home today despite limited mobility. Answers very vague   General Comments       Exercises     Shoulder Instructions      Home Living Family/patient  expects to be discharged to:: Private residence Living Arrangements: Alone Available Help at Discharge: Family;Available PRN/intermittently Type of Home: House Home Access: Stairs to enter [employer] of Steps: 2 Entrance Stairs-Rails: None Home Layout: Two level;Bed/bath upstairs;1/2 bath on main level Alternate Level Stairs-Number of Steps:  14 Alternate Level Stairs-Rails: Right;Left Bathroom Shower/Tub: Occupational psychologist: Standard Bathroom Accessibility: Yes   Home Equipment: Shower seat          Prior Functioning/Environment Level of Independence: Independent        Comments: answers are very vague        OT Problem List: Decreased strength;Decreased activity tolerance;Impaired balance (sitting and/or standing);Pain;Decreased knowledge of use of DME or AE;Decreased cognition;Decreased safety awareness      OT Treatment/Interventions: Self-care/ADL training;DME and/or AE instruction;Patient/family education;Balance training;Therapeutic activities;Cognitive remediation/compensation    OT Goals(Current goals can be found in the care plan section) Acute Rehab OT Goals Patient Stated Goal: Return home OT Goal Formulation: With patient Time For Goal Achievement: 10/06/17 Potential to Achieve Goals: Good ADL Goals Pt Will Perform Grooming: with supervision;standing Pt Will Transfer to Toilet: with supervision;ambulating;regular height toilet Pt Will Perform Toileting - Clothing Manipulation and hygiene: with supervision;sit to/from stand Additional ADL Goal #1: pt will complete adl with supervision and no more than 2 vcs to intiate or continue  OT Frequency: Min 2X/week   Barriers to D/C:            Co-evaluation              AM-PAC PT "6 Clicks" Daily Activity     Outcome Measure Help from another person eating meals?: A Little Help from another person taking care of personal grooming?: A Little Help from another person toileting, which includes using toliet, bedpan, or urinal?: A Little Help from another person bathing (including washing, rinsing, drying)?: A Little Help from another person to put on and taking off regular upper body clothing?: A Little Help from another person to put on and taking off regular lower body clothing?: A Lot 6 Click Score: 17   End of Session     Activity Tolerance: Patient limited by fatigue Patient left: in chair;with call bell/phone within reach;with chair alarm set  OT Visit Diagnosis: Unsteadiness on feet (R26.81)                Time: 0175-1025 OT Time Calculation (min): 14 min Charges:  OT General Charges $OT Visit: 1 Visit OT Evaluation $OT Eval Low Complexity: 1 Low G-Codes:     [place], OTR/L [phone] 09/22/2017  Torrie Lafavor 09/22/2017, 1:57 PM

## 2017-09-22 NOTE — Progress Notes (Signed)
Nutrition Brief Note  Patient identified on the Malnutrition Screening Tool (MST) Report.  RD attempted to see pt x2.  PT working with pt at time of visit.  Will re-attempt tomorrow.    Mariana Single RD, LDN Clinical Nutrition Pager # [phone]

## 2017-09-22 NOTE — Progress Notes (Signed)
PHARMACY NOTE - RENAL DOSE ADJUSTMENT  LMWH increased from 30 to 40 mg q24 for improved renal function Eudelia Bunch, Pharm.D. [phone] 09/22/2017 8:02 AM

## 2017-09-22 NOTE — Progress Notes (Signed)
Patient up to chair until 4pm.  Patient has been very confused and disoriented today, despite attempts by nursing staff to reorient patient.  Patient is not agitated, however.  Dr. Candiss Norse aware.  Will continue to monitor.

## 2017-09-23 LAB — BASIC METABOLIC PANEL
ANION GAP: 8 (ref 5–15)
BUN: 15 mg/dL (ref 6–20)
CALCIUM: 8.7 mg/dL — AB (ref 8.9–10.3)
CO2: 25 mmol/L (ref 22–32)
Chloride: 99 mmol/L — ABNORMAL LOW (ref 101–111)
Creatinine, Ser: 0.97 mg/dL (ref 0.61–1.24)
GFR calc Af Amer: >60 mL/min (ref >60)
Glucose, Bld: 80 mg/dL (ref 65–99)
Potassium: 4 mmol/L (ref 3.5–5.1)
SODIUM: 132 mmol/L — AB (ref 135–145)

## 2017-09-23 LAB — GLUCOSE, CAPILLARY: Glucose-Capillary: 84 mg/dL (ref 65–99)

## 2017-09-23 LAB — MAGNESIUM: Magnesium: 1.3 mg/dL — ABNORMAL LOW (ref 1.7–2.4)

## 2017-09-23 MED ORDER — MIDODRINE HCL 2.5 MG PO TABS
2.5000 mg | ORAL_TABLET | Freq: Three times a day (TID) | ORAL | Status: DC
  Administered 2017-09-23 – 2017-09-25 (×5): 2.5 mg via ORAL
  Filled 2017-09-23 (×8): qty 1

## 2017-09-23 MED ORDER — QUETIAPINE FUMARATE 25 MG PO TABS
25.0000 mg | ORAL_TABLET | Freq: Every day | ORAL | Status: DC
  Administered 2017-09-23 – 2017-09-24 (×2): 25 mg via ORAL
  Filled 2017-09-23 (×2): qty 1

## 2017-09-23 MED ORDER — MAGNESIUM SULFATE IN D5W 1-5 GM/100ML-% IV SOLN
1.0000 g | Freq: Once | INTRAVENOUS | Status: AC
  Administered 2017-09-23: 1 g via INTRAVENOUS
  Filled 2017-09-23: qty 100

## 2017-09-23 MED ORDER — LACTATED RINGERS IV SOLN
INTRAVENOUS | Status: AC
  Administered 2017-09-23 – 2017-09-24 (×2): via INTRAVENOUS

## 2017-09-23 NOTE — NC FL2 (Signed)
[place] LEVEL OF CARE SCREENING TOOL     IDENTIFICATION  Patient Name: Brad Turner Birthdate: 1940-11-29 Sex: male Admission Date (Current Location): 09/20/2017  [facility] [place] Number:  Herbalist and Address:  [facility],  [address], [place]      Provider Number: [id number]  Attending Physician Name and Address:  Thurnell Lose, MD  Relative Name and Phone Number:  Camara Rosander, son, [phone]    Current Level of Care: Hospital Recommended Level of Care: [place] Prior Approval Number:    Date Approved/Denied:   PASRR Number: [id number]  Discharge Plan: SNF    Current Diagnoses: Patient Active Problem List   Diagnosis Date Noted  . Acute delirium 09/16/2017  . Acute hyponatremia 09/16/2017  . Alcohol withdrawal ([place]) 09/16/2017  . 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 Tobacco abuse 09/16/2017  . Depression 09/16/2017  . History of CVA (cerebrovascular accident) 09/16/2017  . History of Barrett's esophagus 09/16/2017  . Takotsubo cardiomyopathy 09/15/2017  . Chronic back pain 12/01/2016  . S/P lumbar spinal fusion 12/01/2016  . Dysthymia 12/01/2016  . Radiculopathy 10/17/2016  . COPD mixed type ([place]) 06/10/2016  . Abnormal CT of the chest 06/10/2016  . Dysphagia   . Cigarette smoker 06/07/2015  . Essential hypertension, benign 10/30/2014    Class: Chronic  . Syncope 10/29/2014  . Cough syncope 10/07/2014  . COPD exacerbation ([place]) 09/29/2014  . Pulmonary nodules 11/22/2013  . Chest pain, unspecified 10/15/2013  . Personal history of colonic polyps 06/04/2010  . COPD (chronic obstructive pulmonary disease) with emphysema ([place]) 02/26/2010  . CHEST PAIN, ATYPICAL 02/26/2010  . BARRETTS ESOPHAGUS 05/18/2009  . HYPERLIPIDEMIA 04/28/2008  . Coronary atherosclerosis 04/28/2008  . CVA 04/28/2008  . GERD 04/28/2008  . ARTHRITIS 04/28/2008   . NEPHROLITHIASIS, HX OF 04/28/2008    Orientation RESPIRATION BLADDER Height & Weight     Self, Time  Normal Continent Weight: 135 lb 9.3 oz (61.5 kg) Height:  5\' 5"  (165.1 cm)  BEHAVIORAL SYMPTOMS/MOOD NEUROLOGICAL BOWEL NUTRITION STATUS      Continent Diet(Heart)  AMBULATORY STATUS COMMUNICATION OF NEEDS Skin   Limited Assist Verbally Normal                       Personal Care Assistance Level of Assistance  Bathing, Feeding, Dressing Bathing Assistance: Limited assistance Feeding assistance: Limited assistance Dressing Assistance: Limited assistance     Functional Limitations Info  Sight, Hearing, Speech Sight Info: Adequate Hearing Info: Adequate Speech Info: Adequate    SPECIAL CARE FACTORS FREQUENCY  PT (By licensed PT), OT (By licensed OT)     PT Frequency: 5x/week OT Frequency: 5x/week            Contractures Contractures Info: Not present    Additional Factors Info  Code Status, Allergies Code Status Info: Full Code Allergies Info: Ivp Dye Iodinated Diagnostic Agents;Macrodantin Nitrofurantoin;           Current Medications (09/23/2017):  This is the current hospital active medication list Current Facility-Administered Medications  Medication Dose Route Frequency Provider Last Rate Last Dose  . acetaminophen (TYLENOL) tablet 1,000 mg  1,000 mg Oral Q6H PRN Garba, Mohammad L, MD      . albuterol (PROVENTIL) (2.5 MG/3ML) 0.083% nebulizer solution 2.5 mg  2.5 mg Nebulization Q6H PRN Gala Romney L, MD      . atorvastatin (LIPITOR) tablet 80 mg  80 mg Oral Daily Elwyn Reach, MD   80 mg at 09/23/17 0945  . clopidogrel (PLAVIX) tablet 75 mg  75 mg Oral Daily Elwyn Reach, MD   75 mg at 09/23/17 0945  . cyanocobalamin ((VITAMIN B-12)) injection 1,000 mcg  1,000 mcg Subcutaneous Daily Thurnell Lose, MD   1,000 mcg at 09/23/17 0947  . docusate sodium (COLACE) capsule 100 mg  100 mg Oral BID Gala Romney L, MD   100 mg at 09/23/17  0946  . enoxaparin (LOVENOX) injection 40 mg  40 mg Subcutaneous Q24H Leodis Sias T, RPH   40 mg at 09/23/17 0946  . feeding supplement (ENSURE ENLIVE) (ENSURE ENLIVE) liquid 237 mL  237 mL Oral BID BM Garba, Mohammad L, MD   237 mL at 09/23/17 1450  . fenofibrate tablet 160 mg  160 mg Oral Daily Elwyn Reach, MD   160 mg at 09/23/17 0947  . fluticasone furoate-vilanterol (BREO ELLIPTA) 200-25 MCG/INH 1 puff  1 puff Inhalation Daily Elwyn Reach, MD   1 puff at 09/23/17 1252  . folic acid (FOLVITE) tablet 1 mg  1 mg Oral Daily Elwyn Reach, MD   1 mg at 09/23/17 0946  . lactated ringers infusion   Intravenous Continuous Thurnell Lose, MD 50 mL/hr at 09/23/17 1112    . magnesium citrate solution 1 Bottle  1 Bottle Oral Once PRN Gala Romney L, MD      . metoprolol succinate (TOPROL-XL) 24 hr tablet 25 mg  25 mg Oral Daily Thurnell Lose, MD   25 mg at 09/23/17 0948  . midodrine (PROAMATINE) tablet 2.5 mg  2.5 mg Oral TID WC Thurnell Lose, MD   2.5 mg at 09/23/17 1630  . mirabegron ER (MYRBETRIQ) tablet 50 mg  50 mg Oral Daily Gala Romney L, MD   50 mg at 09/23/17 0947  . multivitamin with minerals tablet 1 tablet  1 tablet Oral Daily Elwyn Reach, MD   1 tablet at 09/23/17 0946  . nitroGLYCERIN (NITROSTAT) SL tablet 0.4 mg  0.4 mg Sublingual Q5 min PRN Elwyn Reach, MD      . pantoprazole (PROTONIX) EC tablet 40 mg  40 mg Oral Daily Elwyn Reach, MD   40 mg at 09/23/17 0946  . QUEtiapine (SEROQUEL) tablet 25 mg  25 mg Oral QHS Lala Lund K, MD      . sodium chloride flush (NS) 0.9 % injection 3 mL  3 mL Intravenous Q12H Gala Romney L, MD   3 mL at 09/21/17 2231  . thiamine (VITAMIN B-1) tablet 100 mg  100 mg Oral Daily Elwyn Reach, MD   100 mg at 09/23/17 6384     Discharge Medications: Please see discharge summary for a list of discharge medications.  Relevant Imaging Results:  Relevant Lab Results:   Additional  Information SSN: [id number]  Burnis Medin, LCSW

## 2017-09-23 NOTE — Progress Notes (Signed)
@IPLOG @        PROGRESS NOTE                                                                                                                                                                                                             Patient Demographics:    Brad Turner, is a 77 y.o. male, DOB - [birth year], ZHG:[id number]  Admit date - 09/20/2017   Admitting Physician Elwyn Reach, MD  Outpatient Primary MD for the patient is [name], [name] Him, MD  LOS - 1  Chief Complaint  Patient presents with  . Hypotension       Brief Narrative  Brad Turner is a 77 y.o. male with medical history significant of alcohol abuse and recent diagnosis of Takotsubo cardiomyopathy with ST elevation MI including cardiac catheterization with stenting. Patient was discharged on January 14. He was brought back to the ER today after being found by friends and never completely confused and passed out. They reported patient being unresponsive but patient himself denied that. He is still relatively confused and was asking why he was in the ER. In the ER patient was noted to be confused however minimally elevated troponins. EKG looks normal and no other significant abnormalities.  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    Subjective:   Patient in bed, appears comfortable, denies any headache, no fever, no chest pain or pressure, no shortness of breath , no abdominal pain. No focal weakness.   Assessment  & Plan :     1.  Syncope.  Patient was severely orthostatic, appears to be severely dehydrated as well, continue to hold his diuretics and offending medications, continue hydration with IV fluids, TED stockings, orthostatics improving still dropping around 30-40 points will add low-dose midodrine and monitor, advance activity, PT evaluation, echocardiogram from a few weeks ago  evaluated and noted.  Will require SNF placement.  PT following.  2.  Recent Takotsubo cardiomyopathy.  Her workup included left heart cath which was unremarkable a few weeks ago, supportive care.  Troponin trend flat.  Pain-free.  Continue beta-blocker, statin and Plavix for secondary prevention.  Had nonocclusive CAD on the catheterization few weeks ago.  Discussed with cardiologist on call Dr. Johnsie Cancel nothing else to offer.  3.  Previous smoking and alcohol abuse.  States he has quit everything 3 weeks ago.  Continue to monitor, currently no signs of withdrawal.  Continue folic  acid and thiamine supplementation.  4.  Encephalopathy.  Mild delirium now question if he has undiagnosed early dementia, TSH is stable B12 is borderline will start replacement, no focal deficits, monitor with advance activity.  PT evaluation.  Will benefit from outpatient neurology follow-up in the next 1-2 weeks for dementia workup and evaluation.  5.  Dyslipidemia.  On statin and fenofibrate.  6.  Hypertension.  Beta-blocker as tolerated by blood pressure.  7.  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 Recent Takotsubo cardiomyopathy with unremarkable left heart cath 2 weeks ago.  Currently dehydrated, hydrate with IV fluids, hold ACE/ARB due to renal failure, low-dose beta-blocker as tolerated by blood pressure.  No acute issues.  With addition of midodrine will monitor status closely.  8.  GERD.  On PPI continue.  9.  Mild delirium.  Could have undiagnosed early dementia, avoid benzodiazepines and narcotics, held on if gets agitated.  Currently still oriented x 2 , up in the chair, PT eval, exposed to sunlight.  We will give him nighttime low-dose Seroquel to help him sleep better and to reduce daytime delirium.  10.  B12 deficiency.  B12 was borderline low, in the setting of delirium, likely undiagnosed dementia and history of alcohol abuse probably prudent to supplement him,  will give SQ dose daily times 7 days or until he is here thereafter thousand micrograms oral daily indefinitely.    Diet : Diet Heart Room service appropriate? Yes; Fluid consistency: Thin    Family Communication  : None  Code Status :  Full  Disposition Plan  : SNF likely in the morning  Consults  :  None  Procedures  :  None  DVT Prophylaxis  :  Lovenox   Lab Results  Component Value Date   PLT 343 09/20/2017    Inpatient Medications  Scheduled Meds: . atorvastatin  80 mg Oral Daily  . clopidogrel  75 mg Oral Daily  . cyanocobalamin  1,000 mcg Subcutaneous Daily  . docusate sodium  100 mg Oral BID  . enoxaparin (LOVENOX) injection  40 mg Subcutaneous Q24H  . feeding supplement (ENSURE ENLIVE)  237 mL Oral BID BM  . fenofibrate  160 mg Oral Daily  . fluticasone furoate-vilanterol  1 puff Inhalation Daily  . folic acid  1 mg Oral Daily  . metoprolol succinate  25 mg Oral Daily  . midodrine  2.5 mg Oral TID WC  . mirabegron ER  50 mg Oral Daily  . multivitamin with minerals  1 tablet Oral Daily  . pantoprazole  40 mg Oral Daily  . QUEtiapine  25 mg Oral QHS  . sodium chloride flush  3 mL Intravenous Q12H  . thiamine  100 mg Oral Daily   Continuous Infusions:  PRN Meds:.acetaminophen, albuterol, magnesium citrate, nitroGLYCERIN  Antibiotics  :    Anti-infectives (From admission, onward)   None         Objective:   Vitals:   09/22/17 1400 09/22/17 2033 09/23/17 0554 09/23/17 0948  BP:  (!) 149/86 (!) 141/97 (!) 145/88  Pulse:  78 88 84  Resp: 18 18 18    Temp: 97.8 F (36.6 C) 97.7 F (36.5 C) 99.7 F (37.6 C)   TempSrc:  Oral Oral   SpO2: 100% 96% 90%   Weight:   61.5 kg (135 lb 9.3 oz)   Height:        Wt Readings from Last 3 Encounters:  09/23/17 61.5 kg (135  lb 9.3 oz)  09/17/17 65.2 kg (143 lb 11.8 oz)  08/04/17 61.7 kg (136 lb)     Intake/Output Summary (Last 24 hours) at 09/23/2017 1041 Last data filed at 09/23/2017 0554 Gross per 24  hour  Intake 1173.75 ml  Output 1250 ml  Net -76.25 ml     Physical Exam  Awake, in no discomfort, oriented x2, still gets intermittently confused and delirious, no tremors, no focal deficits [place].AT,PERRAL Supple Neck,No JVD, No cervical lymphadenopathy appriciated.  Symmetrical Chest wall movement, Good air movement bilaterally, CTAB RRR,No Gallops, Rubs or new Murmurs, No Parasternal Heave +ve B.Sounds, Abd Soft, No tenderness, No organomegaly appriciated, No rebound - guarding or rigidity. No Cyanosis, Clubbing or edema, No new Rash or bruise    Data Review:    CBC Recent Labs  Lab 09/17/17 0857 09/18/17 0836 09/20/17 2202  WBC 8.9 6.8 6.7  HGB 11.0* 11.1* 10.4*  HCT 31.6* 32.5* 29.5*  PLT 272 308 343  MCV 99.1 100.0 98.7  MCH 34.5* 34.2* 34.8*  MCHC 34.8 34.2 35.3  RDW 14.0 13.8 14.0  LYMPHSABS 2.3 1.9 1.5  MONOABS 0.8 0.6 0.7  EOSABS 0.1 0.0 0.0  BASOSABS 0.0 0.0 0.0    Chemistries  Recent Labs  Lab 09/17/17 0857 09/18/17 0836 09/20/17 2202 09/21/17 0518 09/22/17 0529 09/22/17 1047 09/23/17 0740  NA 129* 131* 128* 130* 131* 132* 132*  K 3.9 3.9 4.0 3.8 4.0 4.3 4.0  CL 95* 96* 96* 98* 102 101 99*  CO2 23 23 22 24 23 24 25   GLUCOSE 65 66 72 63* 81 104* 80  BUN 20 24* 45* 41* 26* 24* 15  CREATININE 1.25* 1.39* 2.22* 1.81* 1.05 1.10 0.97  CALCIUM 8.6* 8.8* 8.5* 8.6* 8.5* 8.7* 8.7*  MG 1.0* 1.2*  --  1.8 1.7  --  1.3*  AST 74* 61* 55* 49*  --   --   --   ALT 37 35 32 31  --   --   --   ALKPHOS 46 43 43 45  --   --   --   BILITOT 1.1 1.2 0.9 1.2  --   --   --    ------------------------------------------------------------------------------------------------------------------ No results for input(s): CHOL, HDL, LDLCALC, TRIG, CHOLHDL, LDLDIRECT in the last 72 hours.  No results found for: HGBA1C ------------------------------------------------------------------------------------------------------------------ Recent Labs    09/21/17 0518  TSH 2.282    ------------------------------------------------------------------------------------------------------------------ Recent Labs    09/21/17 1116  VITAMINB12 279    Coagulation profile Recent Labs  Lab 09/20/17 2202  INR 0.93    No results for input(s): DDIMER in the last 72 hours.  Cardiac Enzymes Recent Labs  Lab 09/21/17 0518 09/21/17 1116 09/21/17 1533  TROPONINI 0.13* 0.11* 0.11*   ------------------------------------------------------------------------------------------------------------------    Component Value Date/Time   BNP 243.7 (H) 09/20/2017 2202    Micro Results Recent Results (from the past 240 hour(s))  Culture, Urine     Status: Abnormal   Collection Time: 09/16/17 10:31 PM  Result Value Ref Range Status   Specimen Description URINE, RANDOM  Final   Special Requests NONE  Final   Culture <10,000 COLONIES/mL INSIGNIFICANT GROWTH (A)  Final   Report Status 09/18/2017 FINAL  Final    Radiology Reports Dg Chest Port 1 View  Result Date: 09/21/2017 CLINICAL DATA:  77 year old male with history of syncope, confusion and recent cardiac catheterization. EXAM: PORTABLE CHEST 1 VIEW COMPARISON:  Chest x-ray 09/15/2017. FINDINGS: Lung volumes are normal. No consolidative airspace disease. No  pleural effusions. No pneumothorax. No suspicious appearing pulmonary nodules or masses. Pulmonary vasculature and the cardiomediastinal silhouette are within normal limits. Aortic atherosclerosis. IMPRESSION: 1. No radiographic evidence of acute cardiopulmonary disease. 2. Aortic atherosclerosis. Electronically Signed   By: Vinnie Langton M.D.   On: 09/21/2017 08:44   Dg Chest Portable 1 View  Result Date: 09/15/2017 CLINICAL DATA:  Weakness, LEFT chest pain. History of pericarditis with similar symptoms. EXAM: PORTABLE CHEST 1 VIEW COMPARISON:  CT chest August 07, 2017. FINDINGS: Cardiac silhouette is upper limits of normal in size, mediastinal silhouette is  nonsuspicious. Mildly calcified aortic knob. Mild hyperinflation. No pleural effusion or focal consolidation. No pneumothorax. Surgical clips LEFT neck compatible with thyroidectomy. Soft tissue planes and included osseous structures are nonsuspicious. Multiple EKG lines overlie the patient and may obscure subtle underlying pathology. IMPRESSION: Borderline cardiomegaly.  No acute pulmonary process. Aortic Atherosclerosis (ICD10-I70.0). Electronically Signed   By: Elon Alas M.D.   On: 09/15/2017 00:22    Time Spent in minutes  30   Lala Lund M.D on 09/23/2017 at 10:41 AM  Between 7am to 7pm - Pager - [phone] ( page via [email or web address], text pages only, please mention full 10 digit call back number). After 7pm go to [email or web address] - password [email or web address]

## 2017-09-23 NOTE — Progress Notes (Signed)
Initial Nutrition Assessment  DOCUMENTATION CODES:   Non-severe (moderate) malnutrition in context of social or environmental circumstances  INTERVENTION:    Ensure Enlive po BID, each supplement provides 350 kcal and 20 grams of protein  Mighty Shake II BID with meals, each supplement provides 480-500 kcals and 20-23 grams of protein  NUTRITION DIAGNOSIS:   Moderate Malnutrition related to social / environmental circumstances(alcohol abuse) as evidenced by moderate fat depletion, moderate muscle depletion.  GOAL:   Patient will meet greater than or equal to 90% of their needs  MONITOR:   PO intake, Weight trends, Labs, Supplement acceptance  REASON FOR ASSESSMENT:   Malnutrition Screening Tool    ASSESSMENT:   Pt with PMH significant for alcohol abuse, Takotsubo cardiomyopathy, recent MI s/p stenting (discharged 1/14), Barrett esophagus, COPD, HLD, HTN, and stroke. Presents this admission with syncope likely related to dehydration or transient arrhythmia.    Spoke with pt at bedside. States his intake has been poor for "a couple of days" related to feelings of fatigue. Pt refuses to eat meals this admission, but drinks Ensures occasionally. Spoke with tech who reports pt spits food out when assisted with feeding. When asked about spitting out food, pt states "food makes me feel tired." Denies alcohol consumption. Since pt is only willing to drink, will provide high calorie high protein shake.   Pt reports a UBW of 135 lb. Denies any recent wt loss. Records indicate pt weighed 158 lb 10/09/16 and 131 lb this admission. This shows a 17% wt loss in one year. Not significant for time frame. Nutrition-Focused physical exam completed.   Medications reviewed and include: Vit X52, colace, folic acid, MVI with minerals, thiamine, Mag sulfate  Labs reviewed: Na 132 (L) Mg 1.3 (L)  NUTRITION - FOCUSED PHYSICAL EXAM:    Most Recent Value  Orbital Region  No depletion  Upper Arm Region   Moderate depletion  Thoracic and Lumbar Region  Unable to assess  Buccal Region  Moderate depletion  Temple Region  Moderate depletion  Clavicle Bone Region  Moderate depletion  Clavicle and Acromion Bone Region  Moderate depletion  Scapular Bone Region  Unable to assess  Dorsal Hand  Mild depletion  Patellar Region  Moderate depletion  Anterior Thigh Region  Moderate depletion  Posterior Calf Region  Moderate depletion  Edema (RD Assessment)  Mild  Hair  Reviewed  Eyes  Reviewed  Mouth  Reviewed  Skin  Reviewed  Nails  Reviewed     Diet Order:  Diet Heart Room service appropriate? Yes; Fluid consistency: Thin  EDUCATION NEEDS:   Not appropriate for education at this time  Skin:  Skin Assessment: Reviewed RN Assessment  Last BM:  09/16/17  Height:   Ht Readings from Last 1 Encounters:  09/21/17 5\' 5"  (1.651 m)    Weight:   Wt Readings from Last 1 Encounters:  09/23/17 135 lb 9.3 oz (61.5 kg)    Ideal Body Weight:  56.8 kg  BMI:  Body mass index is 22.56 kg/m.  Estimated Nutritional Needs:   Kcal:  1700-1900 kcal/[place]  Protein:  85-95 g/[place]  Fluid:  >1.7 L/[place]    Mariana Single RD, LDN Clinical Nutrition Pager # - [phone]

## 2017-09-24 DIAGNOSIS — R0602 Shortness of breath: Secondary | ICD-10-CM

## 2017-09-24 DIAGNOSIS — E440 Moderate protein-calorie malnutrition: Secondary | ICD-10-CM

## 2017-09-24 DIAGNOSIS — I10 Essential (primary) hypertension: Secondary | ICD-10-CM

## 2017-09-24 DIAGNOSIS — R4189 Other symptoms and signs involving cognitive functions and awareness: Secondary | ICD-10-CM

## 2017-09-24 DIAGNOSIS — I5181 Takotsubo syndrome: Secondary | ICD-10-CM

## 2017-09-24 LAB — BASIC METABOLIC PANEL
ANION GAP: 8 (ref 5–15)
BUN: 12 mg/dL (ref 6–20)
CO2: 25 mmol/L (ref 22–32)
Calcium: 8.5 mg/dL — ABNORMAL LOW (ref 8.9–10.3)
Chloride: 97 mmol/L — ABNORMAL LOW (ref 101–111)
Creatinine, Ser: 0.88 mg/dL (ref 0.61–1.24)
GFR calc Af Amer: >60 mL/min (ref >60)
GLUCOSE: 87 mg/dL (ref 65–99)
Potassium: 3.9 mmol/L (ref 3.5–5.1)
Sodium: 130 mmol/L — ABNORMAL LOW (ref 135–145)

## 2017-09-24 LAB — CBC
HEMATOCRIT: 27.3 % — AB (ref 39.0–52.0)
HEMOGLOBIN: 9.6 g/dL — AB (ref 13.0–17.0)
MCH: 34.9 pg — ABNORMAL HIGH (ref 26.0–34.0)
MCHC: 35.2 g/dL (ref 30.0–36.0)
MCV: 99.3 fL (ref 78.0–100.0)
Platelets: 386 10*3/uL (ref 150–400)
RBC: 2.75 MIL/uL — ABNORMAL LOW (ref 4.22–5.81)
RDW: 14.3 % (ref 11.5–15.5)
WBC: 6.7 10*3/uL (ref 4.0–10.5)

## 2017-09-24 LAB — GLUCOSE, CAPILLARY: Glucose-Capillary: 82 mg/dL (ref 65–99)

## 2017-09-24 LAB — MAGNESIUM: MAGNESIUM: 1.5 mg/dL — AB (ref 1.7–2.4)

## 2017-09-24 NOTE — Progress Notes (Signed)
[facility]  PROGRESS NOTE  Brad Turner BDZ:[id number] DOB: Jan 10, 1941 DOA: 09/20/2017 PCP: Haywood Pao, MD   Brief HPI:   77 y.o.malewith medical history significant ofalcohol abuse and recent diagnosis ofTakotsubo cardiomyopathywith ST elevation MI including cardiac catheterization with stenting. Patient was discharged on January 14. He was brought back to the ER today after beingfoundby friends and never completely confused and passed out. They reported patient being unresponsive but patient himself denied that. He is still relatively confused and was asking why he was in the ER. In the ER patient was noted to be confused however minimally elevated troponins. EKG looks normal and no other significant abnormalities.  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    Subjective   Patient seen and examined, denies chest pain or shortness of breath.   Assessment/Plan:     1. Syncope- patient has severe orthostatic, diuretics are currently on hold. Added low-dose by Dr. Caryl Comes. PT evaluation recommended skilled nursing facility rehabilitation. Echocardiogram from 09/15/2017 showed EF 30-35%, dyskinesia of the apical myocardium, grade 1 diastolic dysfunction. 2. Takotsubocardiomyopathy- workup included left heart cath which was unremarkable a few weeks ago. Troponin was elevated 0.11 Continue beta blocker, statin, Plavix for secondary prevention.Dr Candiss Norse discussed with cardiologist on call Dr. Johnsie Cancel, who said there is nothing else to offer. 3. Encephalopathy- mild delirium, improved. TSH is stable. B12 is borderline. B12 was borderline low, in the setting of delirium, likely undiagnosed dementia and history of alcohol abuse probably prudent to supplement him, will give SQ dose daily times 7 days or until he is here thereafter thousand micrograms oral daily  indefinitely.Started on replacement. He would benefit from outpatient neurology evaluation for dementia. 4. Dyslipidemia-continue statin, fenofibrate 5. Hypertension-continue beta blockers 6. Chronic combined systolic and diastolic CHF-currently well compensated. Continue beta blockers. No A/due to renal failure. 7. GERD-continue PPI    DVT prophylaxis: Lovenox  Code Status: Full code  Family Communication: No family present at bedside  Disposition Plan: *Likely skilled nursing facility in a.m.    Consultants:  None  Procedures:  None    Antibiotics:   Anti-infectives (From admission, onward)   None       Objective   Vitals:   09/23/17 1303 09/23/17 2059 09/24/17 0550 09/24/17 1300  BP: 137/77 (!) 146/85 (!) 146/79 (!) 150/77  Pulse: 80 77 75 70  Resp: 18 18 18 18   Temp: 97.8 F (36.6 C) 99.8 F (37.7 C) 98.2 F (36.8 C) 98.5 F (36.9 C)  TempSrc:  Oral Oral Oral  SpO2: 98% 92% 91% 94%  Weight:   60 kg (132 lb 4.4 oz)   Height:        Intake/Output Summary (Last 24 hours) at 09/24/2017 1539 Last data filed at 09/24/2017 1345 Gross per 24 hour  Intake 1098.33 ml  Output 1625 ml  Net -526.67 ml   Filed Weights   09/22/17 0700 09/23/17 0554 09/24/17 0550  Weight: 60.9 kg (134 lb 4.2 oz) 61.5 kg (135 lb 9.3 oz) 60 kg (132 lb 4.4 oz)     Physical Examination:   Physical Exam: Eyes: No icterus, extraocular muscles intact  Mouth: Oral mucosa is moist, no lesions on palate,  Neck: Supple, no deformities, masses, or tenderness Lungs: Normal respiratory effort, bilateral clear to auscultation, no crackles or wheezes.  Heart: Regular rate and rhythm, S1 and S2 normal, no murmurs, rubs auscultated Abdomen:  BS normoactive,soft,nondistended,non-tender to palpation,no organomegaly Extremities: No pretibial edema, no erythema, no cyanosis, no clubbing Neuro : Alert and oriented to place and person, No focal deficits  Skin: No rashes seen on  exam     Data Reviewed: I have personally reviewed following labs and imaging studies  CBG: Recent Labs  Lab 09/21/17 1356 09/22/17 0719 09/22/17 0749 09/23/17 0753 09/24/17 0544  GLUCAP 77 69 97 84 82    CBC: Recent Labs  Lab 09/18/17 0836 09/20/17 2202 09/24/17 0526  WBC 6.8 6.7 6.7  NEUTROABS 4.3 4.6  --   HGB 11.1* 10.4* 9.6*  HCT 32.5* 29.5* 27.3*  MCV 100.0 98.7 99.3  PLT 308 343 295    Basic Metabolic Panel: Recent Labs  Lab 09/18/17 0836  09/21/17 0518 09/22/17 0529 09/22/17 1047 09/23/17 0740 09/24/17 0526  NA 131*   < > 130* 131* 132* 132* 130*  K 3.9   < > 3.8 4.0 4.3 4.0 3.9  CL 96*   < > 98* 102 101 99* 97*  CO2 23   < > 24 23 24 25 25   GLUCOSE 66   < > 63* 81 104* 80 87  BUN 24*   < > 41* 26* 24* 15 12  CREATININE 1.39*   < > 1.81* 1.05 1.10 0.97 0.88  CALCIUM 8.8*   < > 8.6* 8.5* 8.7* 8.7* 8.5*  MG 1.2*  --  1.8 1.7  --  1.3* 1.5*  PHOS 4.0  --   --   --   --   --   --    < > = values in this interval not displayed.    Recent Results (from the past 240 hour(s))  Culture, Urine     Status: Abnormal   Collection Time: 09/16/17 10:31 PM  Result Value Ref Range Status   Specimen Description URINE, RANDOM  Final   Special Requests NONE  Final   Culture <10,000 COLONIES/mL INSIGNIFICANT GROWTH (A)  Final   Report Status 09/18/2017 FINAL  Final     Liver Function Tests: Recent Labs  Lab 09/18/17 0836 09/20/17 2202 09/21/17 0518  AST 61* 55* 49*  ALT 35 32 31  ALKPHOS 43 43 45  BILITOT 1.2 0.9 1.2  PROT 6.4* 6.5 6.2*  ALBUMIN 3.4* 3.4* 3.4*   No results for input(s): LIPASE, AMYLASE in the last 168 hours. No results for input(s): AMMONIA in the last 168 hours.  Cardiac Enzymes: Recent Labs  Lab 09/21/17 0518 09/21/17 1116 09/21/17 1533  TROPONINI 0.13* 0.11* 0.11*   BNP (last 3 results) Recent Labs    09/20/17 2202  BNP 243.7*    ProBNP (last 3 results) No results for input(s): PROBNP in the last 8760  hours.    Studies: No results found.  Scheduled Meds: . atorvastatin  80 mg Oral Daily  . clopidogrel  75 mg Oral Daily  . cyanocobalamin  1,000 mcg Subcutaneous Daily  . docusate sodium  100 mg Oral BID  . enoxaparin (LOVENOX) injection  40 mg Subcutaneous Q24H  . feeding supplement (ENSURE ENLIVE)  237 mL Oral BID BM  . fenofibrate  160 mg Oral Daily  . fluticasone furoate-vilanterol  1 puff Inhalation Daily  . folic acid  1 mg Oral Daily  . metoprolol succinate  25 mg Oral Daily  . midodrine  2.5 mg Oral TID WC  . mirabegron ER  50 mg Oral Daily  . multivitamin with minerals  1 tablet Oral Daily  . pantoprazole  40 mg Oral Daily  . QUEtiapine  25 mg Oral QHS  . sodium chloride flush  3 mL Intravenous Q12H  . thiamine  100 mg Oral Daily      Time spent: 25 min  [facility] Pager [phone]. If 7PM-7AM, please contact night-coverage at [email or web address], Office  [phone]  password [email or web address]  09/24/2017, 3:39 PM  LOS: 2 days

## 2017-09-24 NOTE — Clinical Social Work Note (Signed)
Clinical Social Work Assessment  Patient Details  Name: Brad Turner MRN: [id number] Date of Birth: [birth year]  Date of referral:  09/24/17               Reason for consult:  Facility Placement                Permission sought to share information with:  Facility Sport and exercise psychologist, Family Supports Permission granted to share information::  Yes, Verbal Permission Granted  Name::     Gerald Stabs Wynns  Agency::     Relationship::  Son  Contact Information:  [phone]  Housing/Transportation Living arrangements for the past 2 months:  Single Family Home Source of Information:  Patient, Adult Children Patient Interpreter Needed:  None Criminal Activity/Legal Involvement Pertinent to Current Situation/Hospitalization:  No - Comment as needed Significant Relationships:  Adult Children, Friend, Neighbor Lives with:  Self Do you feel safe going back to the place where you live?  (PT recommending SNF) Need for family participation in patient care:  Yes (Comment)  Care giving concerns:  Patient from home alone. Patient's son reported that he and patient's neighbor would go over and assist patient with ADLs and around the house. Patient's son reported that patient uses a walker to get around the home and usually he can bathe and dress himself. PT recommending SNF.    Social Worker assessment / plan:  CSW spoke with patient/patient's son at bedside regarding discharge planning. Patient oriented at times to situation and other times confused. Patient's son reported that he was agreeable to SNF for ST rehab. CSW explained SNF placement process and insurance authorization, patient's son verbalized understanding. CSW provided patient's son with bed offers. Patient's son reported that he preferred to transfer patient to SNF. CSW reported that he preferred [facility]. CSW agreed to follow up with selected SNF.   CSW will continue to follow up and assist with discharge planning.   Employment  status:  Retired Nurse, adult PT Recommendations:  [place] / Referral to community resources:  [place]  Patient/Family's Response to care:  Patient's son appreciative of CSW assistance with discharge planning.   Patient/Family's Understanding of and Emotional Response to Diagnosis, Current Treatment, and Prognosis:  Patient presented calm and confused. Patient not able to verbalize understanding of diagnosis and current treatment. Patient's son verbalized plan for patient to discharge to SNF for ST rehab.   Emotional Assessment Appearance:  Appears stated age Attitude/Demeanor/Rapport:  Inconsistent Affect (typically observed):  Calm Orientation:  Oriented to Self, Fluctuating Orientation (Suspected and/or reported Sundowners), Oriented to Situation(Patient confused intermittently; not oriented to place or time; some orientation to situation) Alcohol / Substance use:  Not Applicable Psych involvement (Current and /or in the community):  No (Comment)  Discharge Needs  Concerns to be addressed:  Care Coordination Readmission within the last 30 days:  Yes Current discharge risk:  Lives alone, Physical Impairment Barriers to Discharge:  Continued Medical Work up   [employer], LCSW 09/24/2017, 4:53 PM

## 2017-09-24 NOTE — Progress Notes (Signed)
Physical Therapy Treatment Patient Details Name: Brad Turner MRN: [id number] DOB: 02-25-1941 Today's Date: 09/24/2017    History of Present Illness Pt is a 77 y/o male with a PMH significant of CAD s/p multiple POBA, HTN, HLD, COPD, pericarditis, alcohol abuse and recent admission and diagnosis of Takotsubo cardiomyopathy with ST elevation MI including cardiac catheterization with stenting and admitted 09/20/17 for syncopal work up    PT Comments    Pt continues to require at least Min assist for mobility. He remains confused-perseverating on going home and finding his keys to his car. Some difficulty redirecting at times. Session was limited due to confusion. Will continue to follow. Continue to recommend SNF.     Follow Up Recommendations  SNF     Equipment Recommendations  (TBD at next venue)    Recommendations for Other Services       Precautions / Restrictions Precautions Precautions: Fall Restrictions Weight Bearing Restrictions: No    Mobility  Bed Mobility Overal bed mobility: Needs Assistance Bed Mobility: Supine to Sit;Sit to Supine     Supine to sit: Min assist Sit to supine: Min assist   General bed mobility comments: Assist for trunk and LEs. Increased time. Multimodal cueing required.   Transfers Overall transfer level: Needs assistance Equipment used: Rolling walker (2 wheeled) Transfers: Sit to/from Stand Sit to Stand: Min assist         General transfer comment: Assist to rise, stabilize, control descent. Multimodal cueing required.   Ambulation/Gait Ambulation/Gait assistance: Min assist Ambulation Distance (Feet): 10 Feet Assistive device: Rolling walker (2 wheeled) Gait Pattern/deviations: Step-through pattern;Decreased stride length;Trunk flexed     General Gait Details: Multimodal cueing required. Assist to stabilize pt and maneuver with RW. Pt very confused and perseverating about "going home" and "getting his keys"   Stairs             Wheelchair Mobility    Modified Rankin (Stroke Patients Only)       Balance Overall balance assessment: Needs assistance           Standing balance-Leahy Scale: Poor                              Cognition Arousal/Alertness: Awake/alert Behavior During Therapy: Flat affect Overall Cognitive Status: Impaired/Different from baseline Area of Impairment: Memory;Following commands;Safety/judgement;Problem solving                     Memory: Decreased short-term memory Following Commands: Follows one step commands with increased time Safety/Judgement: Decreased awareness of safety;Decreased awareness of deficits   Problem Solving: Difficulty sequencing;Requires verbal cues;Requires tactile cues General Comments: Perseverating on "going home"      Exercises      General Comments        Pertinent Vitals/Pain      Home Living                      Prior Function            PT Goals (current goals can now be found in the care plan section) Progress towards PT goals: Progressing toward goals    Frequency    Min 3X/week      PT Plan      Co-evaluation              AM-PAC PT "6 Clicks" Daily Activity  Outcome Measure  Difficulty turning over in bed (including  adjusting bedclothes, sheets and blankets)?: A Little Difficulty moving from lying on back to sitting on the side of the bed? : Unable Difficulty sitting down on and standing up from a chair with arms (e.g., wheelchair, bedside commode, etc,.)?: A Little Help needed moving to and from a bed to chair (including a wheelchair)?: A Little Help needed walking in hospital room?: A Little Help needed climbing 3-5 steps with a railing? : A Lot 6 Click Score: 15    End of Session Equipment Utilized During Treatment: Gait belt Activity Tolerance: Other (comment)(Limited by confusion) Patient left: in bed;with call bell/phone within reach;with bed alarm set   PT  Visit Diagnosis: Muscle weakness (generalized) (M62.81);Difficulty in walking, not elsewhere classified (R26.2)     Time: 9532-0233 PT Time Calculation (min) (ACUTE ONLY): 17 min  Charges:  $Gait Training: 8-22 mins                    G Codes:          Brad Turner, MPT Pager: [phone]

## 2017-09-25 ENCOUNTER — Ambulatory Visit: Payer: Medicare Other | Admitting: Cardiology

## 2017-09-25 LAB — GLUCOSE, CAPILLARY
Glucose-Capillary: 68 mg/dL (ref 65–99)
Glucose-Capillary: 105 mg/dL — ABNORMAL HIGH (ref 65–99)

## 2017-09-25 MED ORDER — QUETIAPINE FUMARATE 25 MG PO TABS: 25.0000 mg | ORAL_TABLET | Freq: Every day | ORAL | 0 refills | Status: DC

## 2017-09-25 MED ORDER — MIDODRINE HCL 2.5 MG PO TABS: 2.5000 mg | ORAL_TABLET | Freq: Three times a day (TID) | ORAL | Status: DC

## 2017-09-25 MED ORDER — HYDROCODONE-ACETAMINOPHEN 5-325 MG PO TABS: 1.0000 | ORAL_TABLET | Freq: Two times a day (BID) | ORAL | 0 refills | Status: DC

## 2017-09-25 MED ORDER — ENSURE ENLIVE PO LIQD: 237.0000 mL | Freq: Two times a day (BID) | ORAL | 12 refills | Status: DC

## 2017-09-25 MED ORDER — VITAMIN B-12 1000 MCG PO TABS: 1000.0000 ug | ORAL_TABLET | Freq: Every day | ORAL | 2 refills | Status: DC

## 2017-09-25 NOTE — Discharge Summary (Signed)
Physician Discharge Summary  Brad Turner JKD:[id number] DOB: 1941-07-26 DOA: 09/20/2017  PCP: Haywood Pao, MD  Admit date: 09/20/2017 Discharge date: 09/25/2017  Time spent: 25* minutes  Recommendations for Outpatient Follow-up:  1. Follow up PCP in 2 weeks   Discharge Diagnoses:  Principal Problem:   Syncope Active Problems:   Essential hypertension, benign   Cigarette smoker   Takotsubo cardiomyopathy   Malnutrition of moderate degree   Discharge Condition: Stable  Diet recommendation: Heart healthy diet  Filed Weights   09/22/17 0700 09/23/17 0554 09/24/17 0550  Weight: 60.9 kg (134 lb 4.2 oz) 61.5 kg (135 lb 9.3 oz) 60 kg (132 lb 4.4 oz)    History of present illness:  77 y.o.malewith medical history significant ofalcohol abuse and recent diagnosis ofTakotsubo cardiomyopathywith ST elevation MI including cardiac catheterization with stenting. Patient was discharged on January 14. He was brought back to the ER today after beingfoundby friends and never completely confused and passed out. They reported patient being unresponsive but patient himself denied that. He is still relatively confused and was asking why he was in the ER. In the ER patient was noted to be confused however minimally elevated troponins. 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    Hospital Course:   1. Syncope- patient has been  severely orthostatic,. Added low-dose by Midodrine, Cialis has been discontinued. PT evaluation recommended skilled nursing facility rehabilitation. Echocardiogram from 09/15/2017 showed EF 30-35%, dyskinesia of the apical myocardium, grade 1 diastolic dysfunction. 2. Takotsubocardiomyopathy- workup included left heart cath which was unremarkable a few weeks ago. Troponin was elevated  0.11 Continue beta blocker, statin, Plavix for secondary prevention.Dr Candiss Norse discussed with cardiologist on call Dr. Johnsie Cancel, who said there is nothing else to offer. 3. Encephalopathy- mild delirium, improved. TSH is stable. B12 is borderline. B12 was borderline low, in the setting of delirium, likely undiagnosed dementia and history of alcohol abuse probably prudent to supplement him, was given  SQdose daily  For 7 days or until he is here thereafter 1000 micrograms oral daily indefinitely.Started on replacement. He would benefit from outpatient neurology evaluation for dementia. 4. Dyslipidemia-continue statin, fenofibrate 5. Hypertension-continue beta blockers 6. Chronic combined systolic and diastolic CHF-currently well compensated. Continue beta blockers.      Procedures:  Echocardiogram  Consultations:  None   Discharge Exam: Vitals:   09/25/17 0433 09/25/17 0833  BP: (!) 150/75   Pulse: 65   Resp: 18   Temp: 98.8 F (37.1 C)   SpO2: 97% 96%    General: Appears in no acute distress Cardiovascular: S1s2 RRR Respiratory: Clear bilaterally  Discharge Instructions   Discharge Instructions    Diet - low sodium heart healthy   Complete by:  As directed    Increase activity slowly   Complete by:  As directed      Allergies as of 09/25/2017      Reactions   Ivp Dye [iodinated Diagnostic Agents] Hives, Shortness Of Breath   reaction was when pt was 77 years old.   Macrodantin [nitrofurantoin] Other (See Comments)   Fever that lasted several months      Medication List    STOP taking these medications   acetaminophen 500 MG tablet Commonly known as:  TYLENOL   chlordiazePOXIDE 25 MG capsule Commonly known as:  LIBRIUM   tadalafil 5 MG tablet Commonly known  as:  CIALIS     TAKE these medications   albuterol 108 (90 Base) MCG/ACT inhaler Commonly known as:  PROVENTIL HFA;VENTOLIN HFA Inhale 2 puffs into the lungs every 6 (six) hours as needed for wheezing  or shortness of breath. Reported on 11/29/2015   atorvastatin 80 MG tablet Commonly known as:  LIPITOR Take 80 mg by mouth daily.   clopidogrel 75 MG tablet Commonly known as:  PLAVIX Take 75 mg by mouth daily.   DULoxetine 60 MG capsule Commonly known as:  CYMBALTA Take 60 mg by mouth daily.   feeding supplement (ENSURE ENLIVE) Liqd Take 237 mLs by mouth 2 (two) times daily between meals.   fenofibrate 145 MG tablet Commonly known as:  TRICOR Take 145 mg by mouth daily.   folic acid 1 MG tablet Commonly known as:  FOLVITE Take 1 tablet (1 mg total) by mouth daily.   HYDROcodone-acetaminophen 5-325 MG tablet Commonly known as:  NORCO/VICODIN Take 1 tablet by mouth 2 (two) times daily.   hydrOXYzine 25 MG tablet Commonly known as:  ATARAX/VISTARIL Take 1 tablet (25 mg total) by mouth every 6 (six) hours as needed for anxiety (or CIWA score </= 10).   loperamide 2 MG capsule Commonly known as:  IMODIUM Take 1-2 capsules (2-4 mg total) by mouth as needed for diarrhea or loose stools.   metoprolol succinate 50 MG 24 hr tablet Commonly known as:  TOPROL-XL Take 1 tablet (50 mg total) by mouth daily. Take with or immediately following a meal.   midodrine 2.5 MG tablet Commonly known as:  PROAMATINE Take 1 tablet (2.5 mg total) by mouth 3 (three) times daily with meals.   multivitamin with minerals Tabs tablet Take 1 tablet by mouth daily.   MYRBETRIQ 50 MG Tb24 tablet Generic drug:  mirabegron ER Take 50 mg by mouth daily.   NITROSTAT 0.4 MG SL tablet Generic drug:  nitroGLYCERIN Place 0.4 mg under the tongue every 5 (five) minutes as needed for chest pain. Reported on 11/29/2015   omeprazole 40 MG capsule Commonly known as:  PRILOSEC TAKE ONE CAPSULE BY MOUTH TWICE DAILY   QUEtiapine 25 MG tablet Commonly known as:  SEROQUEL Take 1 tablet (25 mg total) by mouth at bedtime.   SYMBICORT 160-4.5 MCG/ACT inhaler Generic drug:  budesonide-formoterol INHALE TWO  PUFFS INTO LUNGS TWICE DAILY   thiamine 100 MG tablet Take 1 tablet (100 mg total) by mouth daily.   tiZANidine 4 MG capsule Commonly known as:  ZANAFLEX Take 4 mg by mouth 3 (three) times daily as needed for muscle spasms.   vitamin B-12 1000 MCG tablet Commonly known as:  CYANOCOBALAMIN Take 1 tablet (1,000 mcg total) by mouth daily.   VOLTAREN 1 % Gel Generic drug:  diclofenac sodium Apply 4 g topically 4 (four) times daily as needed for pain.      Allergies  Allergen Reactions  . Ivp Dye [Iodinated Diagnostic Agents] Hives and Shortness Of Breath    reaction was when pt was 77 years old.  Clancy Gourd [Nitrofurantoin] Other (See Comments)    Fever that lasted several months      The results of significant diagnostics from this hospitalization (including imaging, microbiology, ancillary and laboratory) are listed below for reference.    Significant Diagnostic Studies: Dg Chest Port 1 View  Result Date: 09/21/2017 CLINICAL DATA:  77 year old male with history of syncope, confusion and recent cardiac catheterization. EXAM: PORTABLE CHEST 1 VIEW COMPARISON:  Chest x-ray 09/15/2017. FINDINGS: Lung  volumes are normal. No consolidative airspace disease. No pleural effusions. No pneumothorax. No suspicious appearing pulmonary nodules or masses. Pulmonary vasculature and the cardiomediastinal silhouette are within normal limits. Aortic atherosclerosis. IMPRESSION: 1. No radiographic evidence of acute cardiopulmonary disease. 2. Aortic atherosclerosis. Electronically Signed   By: Vinnie Langton M.D.   On: 09/21/2017 08:44   Dg Chest Portable 1 View  Result Date: 09/15/2017 CLINICAL DATA:  Weakness, LEFT chest pain. History of pericarditis with similar symptoms. EXAM: PORTABLE CHEST 1 VIEW COMPARISON:  CT chest August 07, 2017. FINDINGS: Cardiac silhouette is upper limits of normal in size, mediastinal silhouette is nonsuspicious. Mildly calcified aortic knob. Mild hyperinflation.  No pleural effusion or focal consolidation. No pneumothorax. Surgical clips LEFT neck compatible with thyroidectomy. Soft tissue planes and included osseous structures are nonsuspicious. Multiple EKG lines overlie the patient and may obscure subtle underlying pathology. IMPRESSION: Borderline cardiomegaly.  No acute pulmonary process. Aortic Atherosclerosis (ICD10-I70.0). Electronically Signed   By: Elon Alas M.D.   On: 09/15/2017 00:22    Microbiology: Recent Results (from the past 240 hour(s))  Culture, Urine     Status: Abnormal   Collection Time: 09/16/17 10:31 PM  Result Value Ref Range Status   Specimen Description URINE, RANDOM  Final   Special Requests NONE  Final   Culture <10,000 COLONIES/mL INSIGNIFICANT GROWTH (A)  Final   Report Status 09/18/2017 FINAL  Final     Labs: Basic Metabolic Panel: Recent Labs  Lab 09/21/17 0518 09/22/17 0529 09/22/17 1047 09/23/17 0740 09/24/17 0526  NA 130* 131* 132* 132* 130*  K 3.8 4.0 4.3 4.0 3.9  CL 98* 102 101 99* 97*  CO2 24 23 24 25 25   GLUCOSE 63* 81 104* 80 87  BUN 41* 26* 24* 15 12  CREATININE 1.81* 1.05 1.10 0.97 0.88  CALCIUM 8.6* 8.5* 8.7* 8.7* 8.5*  MG 1.8 1.7  --  1.3* 1.5*   Liver Function Tests: Recent Labs  Lab 09/20/17 2202 09/21/17 0518  AST 55* 49*  ALT 32 31  ALKPHOS 43 45  BILITOT 0.9 1.2  PROT 6.5 6.2*  ALBUMIN 3.4* 3.4*   No results for input(s): LIPASE, AMYLASE in the last 168 hours. No results for input(s): AMMONIA in the last 168 hours. CBC: Recent Labs  Lab 09/20/17 2202 09/24/17 0526  WBC 6.7 6.7  NEUTROABS 4.6  --   HGB 10.4* 9.6*  HCT 29.5* 27.3*  MCV 98.7 99.3  PLT 343 386   Cardiac Enzymes: Recent Labs  Lab 09/21/17 0518 09/21/17 1116 09/21/17 1533  TROPONINI 0.13* 0.11* 0.11*   BNP: BNP (last 3 results) Recent Labs    09/20/17 2202  BNP 243.7*    ProBNP (last 3 results) No results for input(s): PROBNP in the last 8760 hours.  CBG: Recent Labs  Lab  09/22/17 0749 09/23/17 0753 09/24/17 0544 09/25/17 0647 09/25/17 0748  GLUCAP 97 84 82 68 105*       Signed:  Oswald Hillock MD.  [facility] 09/25/2017, 11:41 AM

## 2017-09-25 NOTE — Clinical Social Work Placement (Signed)
Patient received and accepted bed offer at [facility]. Facility aware of patient's discharge and confirmed bed offer. Staff reported that patient's son would be at SNF at 4:30PM and that patient could arrive after. Patient originally planned to be transported by son, Patient's attending MD reported that patient was not safe to travel by car. PTAR contacted and arranged for 5:00PM, left voicemail for patient's son. Patient's RN can call report to [phone], packet complete. CSW signing off, no other needs identified at this time.   CLINICAL SOCIAL WORK PLACEMENT  NOTE  Date:  09/25/2017  Patient Details  Name: Brad Turner MRN: [id number] Date of Birth: 12-Apr-1941  Clinical Social Work is seeking post-discharge placement for this patient at the [place] level of care (*CSW will initial, date and re-position this form in  chart as items are completed):  Yes   Patient/family provided with [place] Work Department's list of facilities offering this level of care within the geographic area requested by the patient (or if unable, by the patient's family).  Yes   Patient/family informed of their freedom to choose among providers that offer the needed level of care, that participate in Medicare, Medicaid or managed care program needed by the patient, have an available bed and are willing to accept the patient.  Yes   Patient/family informed of [facility]'s ownership interest in [facility] and [facility], as well as of the fact that they are under no obligation to receive care at these facilities.  PASRR submitted to EDS on       PASRR number received on       Existing PASRR number confirmed on 09/23/17     FL2 transmitted to all facilities in geographic area requested by pt/family on 09/23/17     FL2 transmitted to all facilities within larger geographic area on       Patient informed that his/her managed care company has contracts with or  will negotiate with certain facilities, including the following:        Yes   Patient/family informed of bed offers received.  Patient chooses bed at [facility]     Physician recommends and patient chooses bed at      Patient to be transferred to [facility] on 09/25/17.  Patient to be transferred to facility by PTAR     Patient family notified on 09/25/17 of transfer.  Name of family member notified:  Left voicemail for son [name])     PHYSICIAN       Additional Comment:    _______________________________________________ Burnis Medin, LCSW 09/25/2017, 3:17 PM

## 2017-09-25 NOTE — Progress Notes (Deleted)
Cardiology Office Note   Date:  09/25/2017   ID:  Boston Cookson Cervi, DOB 05-Dec-1940, MRN [id number]  PCP:  Haywood Pao, MD  Cardiologist:  Dr. Marlou Porch    No chief complaint on file.     History of Present Illness: Brad Turner is a 77 y.o. male who presents for post hospitalization X 2.  09/14/17-/09/18/17 with  Acute chest pain unrelieved with SL NTG.  Similar to prior pericarditis pain. Troponin elevated and EKG with sub mm ST elevation inf so pt had urgent cath.  Pt with non obstructive disease and EF 35-45% with typical pattern of takotsubo cardiomyopathy.  Echo with EF 30-35% with akinesis of basal-mid anteroseptal, anterior, inf, and inferoseptal again takotsubo and G1DD   Medical management with plavix, BB and statin, ACE/ARB held for AKI.  He had ETOH withdrawal, hyponatremia.  Back in ER 09/20/16 after found unresponsive       Past Medical History:  Diagnosis Date  . Alcohol withdrawal ([place])   . Allergy    seasonal  . Arthritis   . Barrett esophagus   . BPH (benign prostatic hyperplasia)   . CAD (coronary artery disease)    a. s/p multiple POBA last in 2011 to distal OM2. b. Takotsubo event 09/2017 with cath showing no culprit, 25% prox LAD, 60% OM1, 65% OM2, EF 35-40% by cath and 30-35% by echo.  . Chronic back pain   . COPD (chronic obstructive pulmonary disease) (HCC)    Albuterol inhaler as needed.Uses Symbicort daily  . Depression   . Emphysema lung ([place])   . Fall    fell on 10/06/16 with loss of consiousness for a short time.States he remembers getting up but remembers nothing else.  . Gastric ulcer   . Gastritis   . GERD (gastroesophageal reflux disease)    takes Omeprazole daily  . History of colon polyps    benign  . History of kidney stones   . Hyperlipidemia   . Hypertension   . Hyponatremia   . Joint pain   . Joint swelling   . 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 Neuropathy   . Nocturia   .  Pneumonia    hx of  . PONV (postoperative nausea and vomiting)   . Skin cancer   . Stroke [facility]) 1995   Affected balance; and has very emotional if watching a movie  . Syncope    yr ago  . Takotsubo cardiomyopathy    a. 09/2017 - EF 35-40% by cath, 30-35% by echo.  . Urgency of urination    takes Myrbetriq daily    Past Surgical History:  Procedure Laterality Date  . APPENDECTOMY    . CARDIAC CATHETERIZATION     x 4. Most recent one in 2011 at [facility](3 previous were done 20 yrs before)  . cataracts Bilateral   . COLONOSCOPY    . CYSTOSCOPY  07/21/2013   Dr. Jeffie Pollock  . DENTAL SURGERY     with sedation  . ESOPHAGEAL MANOMETRY N/A 09/20/2015   Procedure: ESOPHAGEAL MANOMETRY (EM);  Surgeon: Manus Gunning, MD;  Location: [facility];  Service: Gastroenterology;  Laterality: N/A;  . LEFT HEART CATH AND CORONARY ANGIOGRAPHY N/A 09/15/2017   Procedure: LEFT HEART CATH AND CORONARY ANGIOGRAPHY;  Surgeon: Martinique, Peter M, MD;  Location: [place] CV [facility];  Service: Cardiovascular;  Laterality: N/A;  . LUMBAR LAMINECTOMY/DECOMPRESSION MICRODISCECTOMY N/A 01/12/2015   Procedure:  LUMBAR LAMINECTOMY/DECOMPRESSION MICRODISCECTOMY 3 LEVELS;  Surgeon: Phylliss Bob, MD;  Location: [place];  Service: Orthopedics;  Laterality: N/A;  Lumbar 3-4, lumbar 4-5, lumbar 5-sacrum 1 decompression  . parathyroid adenoma removed    . POLYPECTOMY    . ROTATOR CUFF REPAIR     bilateral  . TONSILLECTOMY    . UPPER GASTROINTESTINAL ENDOSCOPY       No current facility-administered medications for this visit.    No current outpatient medications on file.   Facility-Administered Medications Ordered in Other Visits  Medication Dose Route Frequency Provider Last Rate Last Dose  . acetaminophen (TYLENOL) tablet 1,000 mg  1,000 mg Oral Q6H PRN Garba, Mohammad L, MD      . albuterol (PROVENTIL) (2.5 MG/3ML) 0.083% nebulizer solution 2.5 mg  2.5 mg Nebulization Q6H PRN Gala Romney L, MD      . atorvastatin  (LIPITOR) tablet 80 mg  80 mg Oral Daily Gala Romney L, MD   80 mg at 09/24/17 0934  . clopidogrel (PLAVIX) tablet 75 mg  75 mg Oral Daily Elwyn Reach, MD   75 mg at 09/24/17 0935  . cyanocobalamin ((VITAMIN B-12)) injection 1,000 mcg  1,000 mcg Subcutaneous Daily Thurnell Lose, MD   1,000 mcg at 09/24/17 0934  . docusate sodium (COLACE) capsule 100 mg  100 mg Oral BID Gala Romney L, MD   100 mg at 09/24/17 2209  . enoxaparin (LOVENOX) injection 40 mg  40 mg Subcutaneous Q24H Leodis Sias T, RPH   40 mg at 09/24/17 0934  . feeding supplement (ENSURE ENLIVE) (ENSURE ENLIVE) liquid 237 mL  237 mL Oral BID BM Gala Romney L, MD   237 mL at 09/24/17 0935  . fenofibrate tablet 160 mg  160 mg Oral Daily Elwyn Reach, MD   160 mg at 09/24/17 0934  . fluticasone furoate-vilanterol (BREO ELLIPTA) 200-25 MCG/INH 1 puff  1 puff Inhalation Daily Elwyn Reach, MD   1 puff at 09/23/17 1252  . folic acid (FOLVITE) tablet 1 mg  1 mg Oral Daily Elwyn Reach, MD   1 mg at 09/24/17 0934  . magnesium citrate solution 1 Bottle  1 Bottle Oral Once PRN Elwyn Reach, MD      . metoprolol succinate (TOPROL-XL) 24 hr tablet 25 mg  25 mg Oral Daily Thurnell Lose, MD   25 mg at 09/24/17 0935  . midodrine (PROAMATINE) tablet 2.5 mg  2.5 mg Oral TID WC Thurnell Lose, MD   2.5 mg at 09/25/17 0756  . mirabegron ER (MYRBETRIQ) tablet 50 mg  50 mg Oral Daily Elwyn Reach, MD   50 mg at 09/24/17 0933  . multivitamin with minerals tablet 1 tablet  1 tablet Oral Daily Elwyn Reach, MD   1 tablet at 09/24/17 0934  . nitroGLYCERIN (NITROSTAT) SL tablet 0.4 mg  0.4 mg Sublingual Q5 min PRN Gala Romney L, MD      . pantoprazole (PROTONIX) EC tablet 40 mg  40 mg Oral Daily Elwyn Reach, MD   40 mg at 09/24/17 0935  . QUEtiapine (SEROQUEL) tablet 25 mg  25 mg Oral QHS Thurnell Lose, MD   25 mg at 09/24/17 2209  . sodium chloride flush (NS) 0.9 % injection 3 mL  3 mL  Intravenous Q12H Elwyn Reach, MD   3 mL at 09/24/17 2209  . thiamine (VITAMIN B-1) tablet 100 mg  100 mg Oral Daily Elwyn Reach, MD  100 mg at 09/24/17 1517    Allergies:   Ivp dye [iodinated diagnostic agents] and Macrodantin [nitrofurantoin]    Social History:  The patient  reports that he has been smoking cigarettes.  He has a 87.00 pack-year smoking history. he has never used smokeless tobacco. He reports that he drinks about 8.4 oz of alcohol per week. He reports that he does not use drugs.   Family History:  The patient's ***family history includes Colon cancer in his unknown relative; Heart disease in his father; Rheum arthritis in his mother; Stroke in his mother.    ROS:  General:no colds or fevers, no weight changes Skin:no rashes or ulcers HEENT:no blurred vision, no congestion CV:see HPI PUL:see HPI GI:no diarrhea constipation or melena, no indigestion GU:no hematuria, no dysuria MS:no joint pain, no claudication Neuro:no syncope, no lightheadedness Endo:no diabetes, no thyroid disease Wt Readings from Last 3 Encounters:  09/24/17 132 lb 4.4 oz (60 kg)  09/17/17 143 lb 11.8 oz (65.2 kg)  08/04/17 136 lb (61.7 kg)     PHYSICAL EXAM: VS:  There were no vitals taken for this visit. , BMI There is no height or weight on file to calculate BMI. General:Pleasant affect, NAD Skin:Warm and dry, brisk capillary refill HEENT:normocephalic, sclera clear, mucus membranes moist Neck:supple, no JVD, no bruits  Heart:S1S2 RRR without murmur, gallup, rub or click Lungs:clear without rales, rhonchi, or wheezes OHY:WVPX, non tender, + BS, do not palpate liver spleen or masses Ext:no lower ext edema, 2+ pedal pulses, 2+ radial pulses Neuro:alert and oriented, MAE, follows commands, + facial symmetry    EKG:  EKG is ordered today. The ekg ordered today demonstrates ***   Recent Labs: 09/20/2017: B Natriuretic Peptide 243.7 09/21/2017: ALT 31; TSH 2.282 09/24/2017:  BUN 12; Creatinine, Ser 0.88; Hemoglobin 9.6; Magnesium 1.5; Platelets 386; Potassium 3.9; Sodium 130    Lipid Panel    Component Value Date/Time   CHOL 131 09/14/2017 2354   TRIG 43 09/14/2017 2354   HDL 84 09/14/2017 2354   CHOLHDL 1.6 09/14/2017 2354   VLDL 9 09/14/2017 2354   LDLCALC 38 09/14/2017 2354       Other studies Reviewed: Additional studies/ records that were reviewed today include: ***.   ASSESSMENT AND PLAN:  1.  ***   Current medicines are reviewed with the patient today.  The patient Has no concerns regarding medicines.  The following changes have been made:  See above Labs/ tests ordered today include:see above  Disposition:   FU:  see above  Signed, Cecilie Kicks, NP  09/25/2017 8:19 AM    [address], [place], [place], [place] Phone: [phone]; Fax: [phone]

## 2017-09-25 NOTE — Progress Notes (Signed)
CSW followed up with [facility] SNF regarding patient's bed offer. Staff reported that they no longer had a bed available for patient.  CSW provided update to patient's son. Patient's son requested that patient make new selection.  CSW spoke with patient at bedside and provided update. Patient reported that he needed to get to rehearsal and requested to speak with CSW tomorrow. Patient confused and unable to make SNF selection.  CSW contacted patient's son and provided update about patient's confusion and inability to make SNF selection. Patient's son selected [facility] SNF.  CSW contacted [facility], staff confirmed ability to offer patient a bed. Staff agreed to start insurance authorization.  CSW contacted [facility] to follow up on patient's insurance authorization. Staff reported that patient's insurance authorization was received. Staff agreed to follow up with patient's son to complete paperwork. Staff reported that patient's paperwork would need to be completed prior to arrival.   CSW contacted by [facility] and informed they were unable to reach patient's son, CSW agreed to follow up with patient's son.   CSW contacted patient's son, no answer. CSW left voicemail requesting return phone call.  CSW will continue to follow and assist with discharge planning.  Abundio Miu, [place] Social Worker [facility] Cell#: [phone]

## 2017-09-25 NOTE — Progress Notes (Signed)
Nsg Discharge Note  Admit Date:  09/20/2017 Discharge date: 09/25/2017   Dev Dhondt Runkel to be D/C'd [facility] per MD order.  RN called Report to Education administrator at [facility]. No questions or concerns voiced when asked. Pt to leave [employer]. Pt awaiting PTAR at this time. Pt is in bed. Bed alarm on, Bed low and locked, and Call bell within reach. Pt has visitor at bedside.     Discharge Medication: Allergies as of 09/25/2017      Reactions   Ivp Dye [iodinated Diagnostic Agents] Hives, Shortness Of Breath   reaction was when pt was 77 years old.   Macrodantin [nitrofurantoin] Other (See Comments)   Fever that lasted several months      Medication List    STOP taking these medications   acetaminophen 500 MG tablet Commonly known as:  TYLENOL   chlordiazePOXIDE 25 MG capsule Commonly known as:  LIBRIUM   tadalafil 5 MG tablet Commonly known as:  CIALIS     TAKE these medications   albuterol 108 (90 Base) MCG/ACT inhaler Commonly known as:  PROVENTIL HFA;VENTOLIN HFA Inhale 2 puffs into the lungs every 6 (six) hours as needed for wheezing or shortness of breath. Reported on 11/29/2015   atorvastatin 80 MG tablet Commonly known as:  LIPITOR Take 80 mg by mouth daily.   clopidogrel 75 MG tablet Commonly known as:  PLAVIX Take 75 mg by mouth daily.   DULoxetine 60 MG capsule Commonly known as:  CYMBALTA Take 60 mg by mouth daily.   feeding supplement (ENSURE ENLIVE) Liqd Take 237 mLs by mouth 2 (two) times daily between meals.   fenofibrate 145 MG tablet Commonly known as:  TRICOR Take 145 mg by mouth daily.   folic acid 1 MG tablet Commonly known as:  FOLVITE Take 1 tablet (1 mg total) by mouth daily.   HYDROcodone-acetaminophen 5-325 MG tablet Commonly known as:  NORCO/VICODIN Take 1 tablet by mouth 2 (two) times daily.   hydrOXYzine 25 MG tablet Commonly known as:  ATARAX/VISTARIL Take 1 tablet (25 mg total) by mouth every 6 (six) hours as needed for  anxiety (or CIWA score </= 10).   loperamide 2 MG capsule Commonly known as:  IMODIUM Take 1-2 capsules (2-4 mg total) by mouth as needed for diarrhea or loose stools.   metoprolol succinate 50 MG 24 hr tablet Commonly known as:  TOPROL-XL Take 1 tablet (50 mg total) by mouth daily. Take with or immediately following a meal.   midodrine 2.5 MG tablet Commonly known as:  PROAMATINE Take 1 tablet (2.5 mg total) by mouth 3 (three) times daily with meals.   multivitamin with minerals Tabs tablet Take 1 tablet by mouth daily.   MYRBETRIQ 50 MG Tb24 tablet Generic drug:  mirabegron ER Take 50 mg by mouth daily.   NITROSTAT 0.4 MG SL tablet Generic drug:  nitroGLYCERIN Place 0.4 mg under the tongue every 5 (five) minutes as needed for chest pain. Reported on 11/29/2015   omeprazole 40 MG capsule Commonly known as:  PRILOSEC TAKE ONE CAPSULE BY MOUTH TWICE DAILY   QUEtiapine 25 MG tablet Commonly known as:  SEROQUEL Take 1 tablet (25 mg total) by mouth at bedtime.   SYMBICORT 160-4.5 MCG/ACT inhaler Generic drug:  budesonide-formoterol INHALE TWO PUFFS INTO LUNGS TWICE DAILY   thiamine 100 MG tablet Take 1 tablet (100 mg total) by mouth daily.   tiZANidine 4 MG capsule Commonly known as:  ZANAFLEX Take  4 mg by mouth 3 (three) times daily as needed for muscle spasms.   vitamin B-12 1000 MCG tablet Commonly known as:  CYANOCOBALAMIN Take 1 tablet (1,000 mcg total) by mouth daily.   VOLTAREN 1 % Gel Generic drug:  diclofenac sodium Apply 4 g topically 4 (four) times daily as needed for pain.       Discharge Assessment: Vitals:   09/25/17 0433 09/25/17 0833  BP: (!) 150/75   Pulse: 65   Resp: 18   Temp: 98.8 F (37.1 C)   SpO2: 97% 96%    Skin clean, dry and intact without evidence of skin break down, no evidence of skin tears noted. Pt has multiple scattered bruises to Bilateral Upper and Lower Extremities. Excoriation noted to left knee. Redness is noted to pt's  buttocks but is blanchable.  IV catheter discontinued intact. Site without signs and symptoms of complications - no redness or edema noted at insertion site, patient denies c/o pain - only slight tenderness at site.  Dressing with slight pressure applied.    Dorita Fray, RN 09/25/2017 4:16 PM

## 2017-09-25 NOTE — Care Management Important Message (Signed)
Important Message  Patient Details  Name: Elijio Staples Sevey MRN: [id number] Date of Birth: 1941-06-06   Medicare Important Message Given:  Yes    Kerin Salen 09/25/2017, 11:54 AMImportant Message  Patient Details  Name: Lenox Bink Zuba MRN: [id number] Date of Birth: February 25, 1941   Medicare Important Message Given:  Yes    Kerin Salen 09/25/2017, 11:54 AM

## 2017-09-25 NOTE — Care Management Note (Signed)
Case Management Note  Patient Details  Name: Brad Turner MRN: [id number] Date of Birth: 09-Jun-1941  Subjective/Objective:                    Action/Plan:d/c SNF   Expected Discharge Date:  09/25/17               Expected Discharge Plan:  [place]  In-House Referral:     Discharge planning Services  CM Consult  Post Acute Care Choice:    Choice offered to:     DME Arranged:    DME Agency:     HH Arranged:    [place] Agency:     Status of Service:  Completed, signed off  If discussed at [employer] of Stay Meetings, dates discussed:    Additional Comments:  Dessa Phi, RN 09/25/2017, 12:45 PM

## 2017-11-21 ENCOUNTER — Emergency Department (HOSPITAL_COMMUNITY): Payer: Medicare Other

## 2017-11-21 ENCOUNTER — Inpatient Hospital Stay (HOSPITAL_COMMUNITY): Payer: Medicare Other

## 2017-11-21 ENCOUNTER — Encounter (HOSPITAL_COMMUNITY): Payer: Self-pay | Admitting: Emergency Medicine

## 2017-11-21 ENCOUNTER — Other Ambulatory Visit: Payer: Self-pay

## 2017-11-21 ENCOUNTER — Inpatient Hospital Stay (HOSPITAL_COMMUNITY)
Admission: EM | Admit: 2017-11-21 | Discharge: 2017-12-03 | DRG: 853 | Disposition: A | Discharge status: Skilled Nursing Facility | Payer: Medicare Other | Attending: Internal Medicine | Admitting: Internal Medicine

## 2017-11-21 DIAGNOSIS — D62 Acute posthemorrhagic anemia: Secondary | ICD-10-CM | POA: Diagnosis present

## 2017-11-21 DIAGNOSIS — R1012 Left upper quadrant pain: Secondary | ICD-10-CM | POA: Diagnosis not present

## 2017-11-21 DIAGNOSIS — I119 Hypertensive heart disease without heart failure: Secondary | ICD-10-CM | POA: Diagnosis present

## 2017-11-21 DIAGNOSIS — F329 Major depressive disorder, single episode, unspecified: Secondary | ICD-10-CM | POA: Diagnosis not present

## 2017-11-21 DIAGNOSIS — H3530 Unspecified macular degeneration: Secondary | ICD-10-CM | POA: Diagnosis present

## 2017-11-21 DIAGNOSIS — E785 Hyperlipidemia, unspecified: Secondary | ICD-10-CM | POA: Diagnosis present

## 2017-11-21 DIAGNOSIS — D509 Iron deficiency anemia, unspecified: Secondary | ICD-10-CM | POA: Diagnosis present

## 2017-11-21 DIAGNOSIS — J189 Pneumonia, unspecified organism: Secondary | ICD-10-CM

## 2017-11-21 DIAGNOSIS — J90 Pleural effusion, not elsewhere classified: Secondary | ICD-10-CM | POA: Diagnosis present

## 2017-11-21 DIAGNOSIS — E46 Unspecified protein-calorie malnutrition: Secondary | ICD-10-CM | POA: Diagnosis present

## 2017-11-21 DIAGNOSIS — F319 Bipolar disorder, unspecified: Secondary | ICD-10-CM | POA: Diagnosis present

## 2017-11-21 DIAGNOSIS — R338 Other retention of urine: Secondary | ICD-10-CM | POA: Diagnosis present

## 2017-11-21 DIAGNOSIS — I110 Hypertensive heart disease with heart failure: Secondary | ICD-10-CM | POA: Diagnosis present

## 2017-11-21 DIAGNOSIS — I25119 Atherosclerotic heart disease of native coronary artery with unspecified angina pectoris: Secondary | ICD-10-CM | POA: Diagnosis present

## 2017-11-21 DIAGNOSIS — A419 Sepsis, unspecified organism: Principal | ICD-10-CM | POA: Diagnosis present

## 2017-11-21 DIAGNOSIS — J9600 Acute respiratory failure, unspecified whether with hypoxia or hypercapnia: Secondary | ICD-10-CM | POA: Diagnosis present

## 2017-11-21 DIAGNOSIS — Z4682 Encounter for fitting and adjustment of non-vascular catheter: Secondary | ICD-10-CM

## 2017-11-21 DIAGNOSIS — E860 Dehydration: Secondary | ICD-10-CM | POA: Diagnosis present

## 2017-11-21 DIAGNOSIS — J181 Lobar pneumonia, unspecified organism: Secondary | ICD-10-CM | POA: Diagnosis present

## 2017-11-21 DIAGNOSIS — I2510 Atherosclerotic heart disease of native coronary artery without angina pectoris: Secondary | ICD-10-CM | POA: Diagnosis present

## 2017-11-21 DIAGNOSIS — F1099 Alcohol use, unspecified with unspecified alcohol-induced disorder: Secondary | ICD-10-CM | POA: Diagnosis not present

## 2017-11-21 DIAGNOSIS — J440 Chronic obstructive pulmonary disease with acute lower respiratory infection: Secondary | ICD-10-CM | POA: Diagnosis present

## 2017-11-21 DIAGNOSIS — Z7951 Long term (current) use of inhaled steroids: Secondary | ICD-10-CM

## 2017-11-21 DIAGNOSIS — E876 Hypokalemia: Secondary | ICD-10-CM | POA: Diagnosis present

## 2017-11-21 DIAGNOSIS — E871 Hypo-osmolality and hyponatremia: Secondary | ICD-10-CM | POA: Diagnosis present

## 2017-11-21 DIAGNOSIS — I429 Cardiomyopathy, unspecified: Secondary | ICD-10-CM | POA: Diagnosis present

## 2017-11-21 DIAGNOSIS — K210 Gastro-esophageal reflux disease with esophagitis: Secondary | ICD-10-CM | POA: Diagnosis not present

## 2017-11-21 DIAGNOSIS — Z7902 Long term (current) use of antithrombotics/antiplatelets: Secondary | ICD-10-CM

## 2017-11-21 DIAGNOSIS — F13239 Sedative, hypnotic or anxiolytic dependence with withdrawal, unspecified: Secondary | ICD-10-CM | POA: Diagnosis present

## 2017-11-21 DIAGNOSIS — Z85828 Personal history of other malignant neoplasm of skin: Secondary | ICD-10-CM

## 2017-11-21 DIAGNOSIS — F32A Depression, unspecified: Secondary | ICD-10-CM | POA: Diagnosis present

## 2017-11-21 DIAGNOSIS — J869 Pyothorax without fistula: Secondary | ICD-10-CM | POA: Diagnosis present

## 2017-11-21 DIAGNOSIS — Z720 Tobacco use: Secondary | ICD-10-CM | POA: Diagnosis not present

## 2017-11-21 DIAGNOSIS — J9601 Acute respiratory failure with hypoxia: Secondary | ICD-10-CM | POA: Diagnosis present

## 2017-11-21 DIAGNOSIS — J9690 Respiratory failure, unspecified, unspecified whether with hypoxia or hypercapnia: Secondary | ICD-10-CM

## 2017-11-21 DIAGNOSIS — I5022 Chronic systolic (congestive) heart failure: Secondary | ICD-10-CM | POA: Diagnosis present

## 2017-11-21 DIAGNOSIS — R52 Pain, unspecified: Secondary | ICD-10-CM | POA: Diagnosis present

## 2017-11-21 DIAGNOSIS — I5181 Takotsubo syndrome: Secondary | ICD-10-CM

## 2017-11-21 DIAGNOSIS — Z8673 Personal history of transient ischemic attack (TIA), and cerebral infarction without residual deficits: Secondary | ICD-10-CM

## 2017-11-21 DIAGNOSIS — F1010 Alcohol abuse, uncomplicated: Secondary | ICD-10-CM | POA: Diagnosis present

## 2017-11-21 DIAGNOSIS — J441 Chronic obstructive pulmonary disease with (acute) exacerbation: Secondary | ICD-10-CM | POA: Diagnosis present

## 2017-11-21 DIAGNOSIS — J449 Chronic obstructive pulmonary disease, unspecified: Secondary | ICD-10-CM | POA: Diagnosis present

## 2017-11-21 DIAGNOSIS — I9589 Other hypotension: Secondary | ICD-10-CM | POA: Diagnosis present

## 2017-11-21 DIAGNOSIS — N179 Acute kidney failure, unspecified: Secondary | ICD-10-CM | POA: Diagnosis present

## 2017-11-21 DIAGNOSIS — Z419 Encounter for procedure for purposes other than remedying health state, unspecified: Secondary | ICD-10-CM

## 2017-11-21 DIAGNOSIS — K219 Gastro-esophageal reflux disease without esophagitis: Secondary | ICD-10-CM | POA: Diagnosis present

## 2017-11-21 DIAGNOSIS — I252 Old myocardial infarction: Secondary | ICD-10-CM

## 2017-11-21 DIAGNOSIS — Z823 Family history of stroke: Secondary | ICD-10-CM | POA: Diagnosis not present

## 2017-11-21 DIAGNOSIS — I313 Pericardial effusion (noninflammatory): Secondary | ICD-10-CM | POA: Diagnosis present

## 2017-11-21 DIAGNOSIS — Z9689 Presence of other specified functional implants: Secondary | ICD-10-CM

## 2017-11-21 DIAGNOSIS — Z955 Presence of coronary angioplasty implant and graft: Secondary | ICD-10-CM

## 2017-11-21 DIAGNOSIS — F419 Anxiety disorder, unspecified: Secondary | ICD-10-CM | POA: Diagnosis not present

## 2017-11-21 DIAGNOSIS — G8929 Other chronic pain: Secondary | ICD-10-CM | POA: Diagnosis present

## 2017-11-21 DIAGNOSIS — Z6822 Body mass index (BMI) 22.0-22.9, adult: Secondary | ICD-10-CM

## 2017-11-21 DIAGNOSIS — I10 Essential (primary) hypertension: Secondary | ICD-10-CM | POA: Diagnosis not present

## 2017-11-21 DIAGNOSIS — K2270 Barrett's esophagus without dysplasia: Secondary | ICD-10-CM | POA: Diagnosis present

## 2017-11-21 DIAGNOSIS — D638 Anemia in other chronic diseases classified elsewhere: Secondary | ICD-10-CM | POA: Diagnosis not present

## 2017-11-21 DIAGNOSIS — M549 Dorsalgia, unspecified: Secondary | ICD-10-CM | POA: Diagnosis present

## 2017-11-21 DIAGNOSIS — Z9049 Acquired absence of other specified parts of digestive tract: Secondary | ICD-10-CM

## 2017-11-21 DIAGNOSIS — Y95 Nosocomial condition: Secondary | ICD-10-CM | POA: Diagnosis present

## 2017-11-21 DIAGNOSIS — L8990 Pressure ulcer of unspecified site, unspecified stage: Secondary | ICD-10-CM

## 2017-11-21 DIAGNOSIS — J939 Pneumothorax, unspecified: Secondary | ICD-10-CM

## 2017-11-21 DIAGNOSIS — Z79899 Other long term (current) drug therapy: Secondary | ICD-10-CM

## 2017-11-21 DIAGNOSIS — Z9181 History of falling: Secondary | ICD-10-CM

## 2017-11-21 DIAGNOSIS — R1310 Dysphagia, unspecified: Secondary | ICD-10-CM | POA: Diagnosis present

## 2017-11-21 DIAGNOSIS — J918 Pleural effusion in other conditions classified elsewhere: Secondary | ICD-10-CM

## 2017-11-21 DIAGNOSIS — Z981 Arthrodesis status: Secondary | ICD-10-CM

## 2017-11-21 DIAGNOSIS — R450 Nervousness: Secondary | ICD-10-CM | POA: Diagnosis not present

## 2017-11-21 DIAGNOSIS — F17210 Nicotine dependence, cigarettes, uncomplicated: Secondary | ICD-10-CM | POA: Diagnosis present

## 2017-11-21 DIAGNOSIS — R109 Unspecified abdominal pain: Secondary | ICD-10-CM | POA: Diagnosis present

## 2017-11-21 LAB — COMPREHENSIVE METABOLIC PANEL
ALK PHOS: 70 U/L (ref 38–126)
ALT: 26 U/L (ref 17–63)
AST: 40 U/L (ref 15–41)
Albumin: 2.5 g/dL — ABNORMAL LOW (ref 3.5–5.0)
Anion gap: 12 (ref 5–15)
BUN: 15 mg/dL (ref 6–20)
CALCIUM: 8.1 mg/dL — AB (ref 8.9–10.3)
CO2: 26 mmol/L (ref 22–32)
CREATININE: 1.35 mg/dL — AB (ref 0.61–1.24)
Chloride: 92 mmol/L — ABNORMAL LOW (ref 101–111)
GFR, EST AFRICAN AMERICAN: 58 mL/min — AB (ref >60)
GFR, EST NON AFRICAN AMERICAN: 50 mL/min — AB (ref >60)
Glucose, Bld: 104 mg/dL — ABNORMAL HIGH (ref 65–99)
Potassium: 3.4 mmol/L — ABNORMAL LOW (ref 3.5–5.1)
SODIUM: 130 mmol/L — AB (ref 135–145)
Total Bilirubin: 1 mg/dL (ref 0.3–1.2)
Total Protein: 7.2 g/dL (ref 6.5–8.1)

## 2017-11-21 LAB — LACTIC ACID, PLASMA
LACTIC ACID, VENOUS: 1.5 mmol/L (ref 0.5–1.9)
Lactic Acid, Venous: 0.9 mmol/L (ref 0.5–1.9)

## 2017-11-21 LAB — IRON AND TIBC
IRON: 9 ug/dL — AB (ref 45–182)
Saturation Ratios: 5 % — ABNORMAL LOW (ref 17.9–39.5)
TIBC: 171 ug/dL — ABNORMAL LOW (ref 250–450)
UIBC: 162 ug/dL

## 2017-11-21 LAB — CBC
HCT: 29.5 % — ABNORMAL LOW (ref 39.0–52.0)
HEMOGLOBIN: 10.1 g/dL — AB (ref 13.0–17.0)
MCH: 31.9 pg (ref 26.0–34.0)
MCHC: 34.2 g/dL (ref 30.0–36.0)
MCV: 93.1 fL (ref 78.0–100.0)
PLATELETS: 758 10*3/uL — AB (ref 150–400)
RBC: 3.17 MIL/uL — AB (ref 4.22–5.81)
RDW: 13.5 % (ref 11.5–15.5)
WBC: 28.6 10*3/uL — AB (ref 4.0–10.5)

## 2017-11-21 LAB — MAGNESIUM: MAGNESIUM: 1 mg/dL — AB (ref 1.7–2.4)

## 2017-11-21 LAB — URINALYSIS, ROUTINE W REFLEX MICROSCOPIC
BILIRUBIN URINE: NEGATIVE
Bacteria, UA: NONE SEEN
GLUCOSE, UA: NEGATIVE mg/dL
HGB URINE DIPSTICK: NEGATIVE
KETONES UR: NEGATIVE mg/dL
LEUKOCYTES UA: NEGATIVE
NITRITE: NEGATIVE
PH: 5 (ref 5.0–8.0)
PROTEIN: 30 mg/dL — AB
Specific Gravity, Urine: 1.019 (ref 1.005–1.030)

## 2017-11-21 LAB — PROTIME-INR
INR: 1.23
PROTHROMBIN TIME: 15.4 s — AB (ref 11.4–15.2)

## 2017-11-21 LAB — FOLATE: FOLATE: 20.4 ng/mL (ref >5.9)

## 2017-11-21 LAB — I-STAT TROPONIN, ED: TROPONIN I, POC: 0.02 ng/mL (ref 0.00–0.08)

## 2017-11-21 LAB — DIFFERENTIAL
Basophils Absolute: 0 10*3/uL (ref 0.0–0.1)
Basophils Relative: 0 %
EOS ABS: 0 10*3/uL (ref 0.0–0.7)
EOS PCT: 0 %
LYMPHS ABS: 1.5 10*3/uL (ref 0.7–4.0)
LYMPHS PCT: 6 %
Monocytes Absolute: 1.4 10*3/uL — ABNORMAL HIGH (ref 0.1–1.0)
Monocytes Relative: 5 %
NEUTROS PCT: 89 %
Neutro Abs: 25.1 10*3/uL — ABNORMAL HIGH (ref 1.7–7.7)

## 2017-11-21 LAB — INFLUENZA PANEL BY PCR (TYPE A & B)
INFLAPCR: NEGATIVE
Influenza B By PCR: NEGATIVE

## 2017-11-21 LAB — RETICULOCYTES
RBC.: 2.73 MIL/uL — AB (ref 4.22–5.81)
RETIC COUNT ABSOLUTE: 30 10*3/uL (ref 19.0–186.0)
Retic Ct Pct: 1.1 % (ref 0.4–3.1)

## 2017-11-21 LAB — BRAIN NATRIURETIC PEPTIDE: B NATRIURETIC PEPTIDE 5: 184.6 pg/mL — AB (ref 0.0–100.0)

## 2017-11-21 LAB — I-STAT CG4 LACTIC ACID, ED
LACTIC ACID, VENOUS: 1.44 mmol/L (ref 0.5–1.9)
Lactic Acid, Venous: 1.17 mmol/L (ref 0.5–1.9)

## 2017-11-21 LAB — LIPASE, BLOOD: LIPASE: 21 U/L (ref 11–51)

## 2017-11-21 LAB — VITAMIN B12: VITAMIN B 12: 592 pg/mL (ref 180–914)

## 2017-11-21 LAB — FERRITIN: Ferritin: 386 ng/mL — ABNORMAL HIGH (ref 24–336)

## 2017-11-21 LAB — STREP PNEUMONIAE URINARY ANTIGEN: Strep Pneumo Urinary Antigen: NEGATIVE

## 2017-11-21 LAB — MRSA PCR SCREENING: MRSA BY PCR: NEGATIVE

## 2017-11-21 MED ORDER — ALBUTEROL SULFATE (2.5 MG/3ML) 0.083% IN NEBU
2.5000 mg | INHALATION_SOLUTION | Freq: Four times a day (QID) | RESPIRATORY_TRACT | Status: DC
  Administered 2017-11-22: 2.5 mg via RESPIRATORY_TRACT
  Filled 2017-11-21: qty 3

## 2017-11-21 MED ORDER — NICOTINE 21 MG/24HR TD PT24
21.0000 mg | MEDICATED_PATCH | Freq: Every day | TRANSDERMAL | Status: DC
  Administered 2017-11-21 – 2017-12-03 (×12): 21 mg via TRANSDERMAL
  Filled 2017-11-21 (×12): qty 1

## 2017-11-21 MED ORDER — LORAZEPAM 1 MG PO TABS: 1.0000 mg | ORAL_TABLET | Freq: Four times a day (QID) | ORAL | Status: DC | PRN

## 2017-11-21 MED ORDER — POTASSIUM CHLORIDE CRYS ER 20 MEQ PO TBCR
40.0000 meq | EXTENDED_RELEASE_TABLET | Freq: Once | ORAL | Status: AC
  Administered 2017-11-21: 40 meq via ORAL
  Filled 2017-11-21: qty 2

## 2017-11-21 MED ORDER — VITAMIN B-1 100 MG PO TABS
100.0000 mg | ORAL_TABLET | Freq: Every day | ORAL | Status: DC
  Administered 2017-11-22 – 2017-11-23 (×2): 100 mg via ORAL
  Filled 2017-11-21 (×2): qty 1

## 2017-11-21 MED ORDER — SODIUM CHLORIDE 0.9 % IV SOLN
INTRAVENOUS | Status: AC
  Administered 2017-11-21 – 2017-11-22 (×2): via INTRAVENOUS
  Administered 2017-11-22: 75 mL via INTRAVENOUS

## 2017-11-21 MED ORDER — ACETAMINOPHEN 325 MG PO TABS
650.0000 mg | ORAL_TABLET | Freq: Four times a day (QID) | ORAL | Status: DC | PRN
  Administered 2017-11-22: 650 mg via ORAL
  Filled 2017-11-21: qty 2

## 2017-11-21 MED ORDER — ACETAMINOPHEN 500 MG PO TABS
1000.0000 mg | ORAL_TABLET | Freq: Once | ORAL | Status: AC
  Administered 2017-11-21: 1000 mg via ORAL
  Filled 2017-11-21: qty 2

## 2017-11-21 MED ORDER — GUAIFENESIN ER 600 MG PO TB12: 600.0000 mg | ORAL_TABLET | ORAL | Status: DC | PRN

## 2017-11-21 MED ORDER — ADULT MULTIVITAMIN W/MINERALS CH
1.0000 | ORAL_TABLET | Freq: Every day | ORAL | Status: DC
  Administered 2017-11-22 – 2017-11-23 (×2): 1 via ORAL
  Filled 2017-11-21 (×3): qty 1

## 2017-11-21 MED ORDER — DULOXETINE HCL 60 MG PO CPEP
60.0000 mg | ORAL_CAPSULE | Freq: Every day | ORAL | Status: DC
  Administered 2017-11-21 – 2017-12-03 (×10): 60 mg via ORAL
  Filled 2017-11-21 (×10): qty 1

## 2017-11-21 MED ORDER — ONDANSETRON HCL 4 MG/2ML IJ SOLN: 4.0000 mg | Freq: Four times a day (QID) | INTRAMUSCULAR | Status: DC | PRN

## 2017-11-21 MED ORDER — POTASSIUM CHLORIDE 10 MEQ/100ML IV SOLN
10.0000 meq | INTRAVENOUS | Status: DC
  Administered 2017-11-21: 10 meq via INTRAVENOUS
  Filled 2017-11-21: qty 100

## 2017-11-21 MED ORDER — QUETIAPINE FUMARATE 50 MG PO TABS
25.0000 mg | ORAL_TABLET | Freq: Every day | ORAL | Status: DC
  Administered 2017-11-21 – 2017-12-02 (×10): 25 mg via ORAL
  Filled 2017-11-21 (×10): qty 1

## 2017-11-21 MED ORDER — PIPERACILLIN-TAZOBACTAM 3.375 G IVPB 30 MIN
3.3750 g | Freq: Once | INTRAVENOUS | Status: AC
  Administered 2017-11-21: 3.375 g via INTRAVENOUS
  Filled 2017-11-21: qty 50

## 2017-11-21 MED ORDER — FOLIC ACID 1 MG PO TABS
1.0000 mg | ORAL_TABLET | Freq: Every day | ORAL | Status: DC
  Administered 2017-11-22 – 2017-11-23 (×2): 1 mg via ORAL
  Filled 2017-11-21 (×2): qty 1

## 2017-11-21 MED ORDER — THIAMINE HCL 100 MG/ML IJ SOLN
Freq: Once | INTRAVENOUS | Status: AC
  Administered 2017-11-21: 14:00:00 via INTRAVENOUS
  Filled 2017-11-21: qty 1000

## 2017-11-21 MED ORDER — POTASSIUM CHLORIDE 10 MEQ/100ML IV SOLN
10.0000 meq | INTRAVENOUS | Status: AC
Start: 2017-11-21 — End: 2017-11-21
  Administered 2017-11-21 (×2): 10 meq via INTRAVENOUS
  Filled 2017-11-21 (×2): qty 100

## 2017-11-21 MED ORDER — MORPHINE SULFATE (PF) 4 MG/ML IV SOLN
4.0000 mg | Freq: Once | INTRAVENOUS | Status: AC
  Administered 2017-11-21: 4 mg via INTRAVENOUS
  Filled 2017-11-21: qty 1

## 2017-11-21 MED ORDER — ONDANSETRON HCL 4 MG PO TABS
4.0000 mg | ORAL_TABLET | Freq: Four times a day (QID) | ORAL | Status: DC | PRN
  Administered 2017-11-23: 4 mg via ORAL
  Filled 2017-11-21: qty 1

## 2017-11-21 MED ORDER — ALBUTEROL SULFATE (2.5 MG/3ML) 0.083% IN NEBU
2.5000 mg | INHALATION_SOLUTION | RESPIRATORY_TRACT | Status: DC
  Administered 2017-11-21: 2.5 mg via RESPIRATORY_TRACT
  Filled 2017-11-21: qty 3

## 2017-11-21 MED ORDER — PIPERACILLIN-TAZOBACTAM 3.375 G IVPB
3.3750 g | Freq: Three times a day (TID) | INTRAVENOUS | Status: DC
  Administered 2017-11-21 – 2017-11-29 (×24): 3.375 g via INTRAVENOUS
  Filled 2017-11-21 (×25): qty 50

## 2017-11-21 MED ORDER — SODIUM CHLORIDE 0.9 % IV BOLUS (SEPSIS)
500.0000 mL | Freq: Once | INTRAVENOUS | Status: AC
  Administered 2017-11-21: 500 mL via INTRAVENOUS

## 2017-11-21 MED ORDER — VITAMIN B-12 1000 MCG PO TABS: 1000.0000 ug | ORAL_TABLET | Freq: Every day | ORAL | Status: DC

## 2017-11-21 MED ORDER — LORAZEPAM 2 MG/ML IJ SOLN
1.0000 mg | Freq: Four times a day (QID) | INTRAMUSCULAR | Status: DC | PRN
  Administered 2017-11-23: 1 mg via INTRAVENOUS
  Filled 2017-11-21: qty 1

## 2017-11-21 MED ORDER — ALBUTEROL SULFATE (2.5 MG/3ML) 0.083% IN NEBU: 2.5000 mg | INHALATION_SOLUTION | RESPIRATORY_TRACT | Status: DC | PRN

## 2017-11-21 MED ORDER — THIAMINE HCL 100 MG PO TABS: 100.0000 mg | ORAL_TABLET | Freq: Every day | ORAL | Status: DC

## 2017-11-21 MED ORDER — MIDODRINE HCL 5 MG PO TABS
2.5000 mg | ORAL_TABLET | Freq: Three times a day (TID) | ORAL | Status: DC
  Administered 2017-11-21 – 2017-11-23 (×6): 2.5 mg via ORAL
  Filled 2017-11-21 (×8): qty 1

## 2017-11-21 MED ORDER — VANCOMYCIN HCL 500 MG IV SOLR
500.0000 mg | Freq: Two times a day (BID) | INTRAVENOUS | Status: DC
  Administered 2017-11-21 – 2017-11-22 (×3): 500 mg via INTRAVENOUS
  Filled 2017-11-21 (×4): qty 500

## 2017-11-21 MED ORDER — PIPERACILLIN-TAZOBACTAM 3.375 G IVPB 30 MIN: 3.3750 g | Freq: Once | INTRAVENOUS | Status: DC

## 2017-11-21 MED ORDER — ACETAMINOPHEN 650 MG RE SUPP: 650.0000 mg | Freq: Four times a day (QID) | RECTAL | Status: DC | PRN

## 2017-11-21 MED ORDER — MOMETASONE FURO-FORMOTEROL FUM 200-5 MCG/ACT IN AERO
2.0000 | INHALATION_SPRAY | Freq: Two times a day (BID) | RESPIRATORY_TRACT | Status: DC
  Administered 2017-11-21 – 2017-11-22 (×2): 2 via RESPIRATORY_TRACT
  Filled 2017-11-21: qty 8.8

## 2017-11-21 MED ORDER — TIZANIDINE HCL 4 MG PO TABS
4.0000 mg | ORAL_TABLET | Freq: Three times a day (TID) | ORAL | Status: DC
  Administered 2017-11-21 – 2017-11-23 (×5): 4 mg via ORAL
  Filled 2017-11-21 (×5): qty 1

## 2017-11-21 MED ORDER — VANCOMYCIN HCL IN DEXTROSE 1-5 GM/200ML-% IV SOLN
1000.0000 mg | Freq: Once | INTRAVENOUS | Status: AC
  Administered 2017-11-21: 1000 mg via INTRAVENOUS
  Filled 2017-11-21: qty 200

## 2017-11-21 MED ORDER — CLOPIDOGREL BISULFATE 75 MG PO TABS
75.0000 mg | ORAL_TABLET | Freq: Every day | ORAL | Status: DC
  Administered 2017-11-21 – 2017-11-23 (×3): 75 mg via ORAL
  Filled 2017-11-21 (×3): qty 1

## 2017-11-21 MED ORDER — BARIUM SULFATE 2.1 % PO SUSP
ORAL | Status: AC
  Filled 2017-11-21: qty 2

## 2017-11-21 MED ORDER — FOLIC ACID 1 MG PO TABS: 1.0000 mg | ORAL_TABLET | Freq: Every day | ORAL | Status: DC

## 2017-11-21 MED ORDER — ONDANSETRON HCL 4 MG/2ML IJ SOLN
4.0000 mg | Freq: Once | INTRAMUSCULAR | Status: AC
  Administered 2017-11-21: 4 mg via INTRAVENOUS
  Filled 2017-11-21: qty 2

## 2017-11-21 MED ORDER — MAGNESIUM SULFATE 2 GM/50ML IV SOLN
2.0000 g | Freq: Once | INTRAVENOUS | Status: AC
  Administered 2017-11-21: 2 g via INTRAVENOUS
  Filled 2017-11-21: qty 50

## 2017-11-21 MED ORDER — LOPERAMIDE HCL 2 MG PO CAPS
2.0000 mg | ORAL_CAPSULE | ORAL | Status: DC | PRN
  Administered 2017-11-28: 4 mg via ORAL
  Filled 2017-11-21: qty 2

## 2017-11-21 MED ORDER — ALBUTEROL SULFATE (2.5 MG/3ML) 0.083% IN NEBU: 2.5000 mg | INHALATION_SOLUTION | Freq: Four times a day (QID) | RESPIRATORY_TRACT | Status: DC

## 2017-11-21 MED ORDER — THIAMINE HCL 100 MG/ML IJ SOLN: 100.0000 mg | Freq: Every day | INTRAMUSCULAR | Status: DC

## 2017-11-21 MED ORDER — ATORVASTATIN CALCIUM 80 MG PO TABS
80.0000 mg | ORAL_TABLET | Freq: Every day | ORAL | Status: DC
  Administered 2017-11-22 – 2017-12-03 (×9): 80 mg via ORAL
  Filled 2017-11-21 (×10): qty 1

## 2017-11-21 MED ORDER — HYDROCODONE-HOMATROPINE 5-1.5 MG/5ML PO SYRP
5.0000 mL | ORAL_SOLUTION | ORAL | Status: DC | PRN
  Administered 2017-11-21 – 2017-11-23 (×2): 5 mL via ORAL
  Filled 2017-11-21 (×2): qty 5

## 2017-11-21 MED ORDER — GUAIFENESIN ER 600 MG PO TB12
1200.0000 mg | ORAL_TABLET | Freq: Two times a day (BID) | ORAL | Status: DC
  Administered 2017-11-21 – 2017-12-03 (×19): 1200 mg via ORAL
  Filled 2017-11-21 (×19): qty 2

## 2017-11-21 MED ORDER — SODIUM CHLORIDE 0.9 % IV BOLUS (SEPSIS): 500.0000 mL | Freq: Once | INTRAVENOUS | Status: DC

## 2017-11-21 NOTE — ED Notes (Signed)
Attempted Report. Agricultural consultant unavailable.

## 2017-11-21 NOTE — H&P (Signed)
History and Physical    Brad Turner MCN:[id number] DOB: 10/19/1940 DOA: 11/21/2017  PCP: Haywood Pao, MD Patient coming from: home  Chief Complaint: abdominal pain  HPI: Brad Turner is a 77 y.o. male with medical history significant for alcohol abuse, recent Takotsubo gammopathy with ST elevation MI including cardiac cath with stent,hypertension, COPD, tobacco use, serious CVA, since emergency Department chief complaint abdominal pain and shortness of breath. Initial evaluation reveals sepsis likely related to healthcare associated pneumonia. [facility] are asked to admit.  Information is obtained from the chart and the patient. E states he was in his usual state of health over the last 2-3 days he developed intermittent abdominal pain. He describes the pain as constant ache worse with taking a deep breath. Describes it as a "spasm". He also states that eating makes it worse.He denies nausea vomiting. He does endorse a decreased oral intake due to decreased appetite. He denies headache dizziness syncope or near-syncope. He denies chest pain palpitation but does endorse shortness of breath is no worse and baseline. Also reports increased lower extremity swelling. He's had a wet sounding nonproductive cough denies fever chills. Denies diarrhea constipation melena bright red blood per rectum. Denies dysuria hematuria frequency or urgency.    ED Course: emergency department max temp is 101.9 ectally he's tachycardia blood pressure stable hypoxic with an oxygen saturation level of 84% on room air. He is provided with 500 mL normal saline broad-spectrum antibiotic oxygen supplementation. At the time of admission he is in no acute distress not hypoxic on 2 L nasal cannula nontoxic  Review of Systems: As per HPI otherwise all other systems reviewed and are negative.   Ambulatory Status: at home alone. Ambulates independently is independent with ADLs  Past Medical History:  Diagnosis  Date  . Alcohol withdrawal ([place])   . Allergy    seasonal  . Arthritis   . Barrett esophagus   . BPH (benign prostatic hyperplasia)   . CAD (coronary artery disease)    a. s/p multiple POBA last in 2011 to distal OM2. b. Takotsubo event 09/2017 with cath showing no culprit, 25% prox LAD, 60% OM1, 65% OM2, EF 35-40% by cath and 30-35% by echo.  . Chronic back pain   . COPD (chronic obstructive pulmonary disease) (HCC)    Albuterol inhaler as needed.Uses Symbicort daily  . Depression   . Emphysema lung ([place])   . Fall    fell on 10/06/16 with loss of consiousness for a short time.States he remembers getting up but remembers nothing else.  . Gastric ulcer   . Gastritis   . GERD (gastroesophageal reflux disease)    takes Omeprazole daily  . History of colon polyps    benign  . History of kidney stones   . Hyperlipidemia   . Hypertension   . Hyponatremia   . Joint pain   . Joint swelling   . 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 Neuropathy   . Nocturia   . Pneumonia    hx of  . PONV (postoperative nausea and vomiting)   . Skin cancer   . Stroke [facility] 1995   Affected balance; and has very emotional if watching a movie  . Syncope    yr ago  . Takotsubo cardiomyopathy    a. 09/2017 - EF 35-40% by cath, 30-35% by echo.  . Urgency of urination    takes Myrbetriq daily  Past Surgical History:  Procedure Laterality Date  . APPENDECTOMY    . CARDIAC CATHETERIZATION     x 4. Most recent one in 2011 at [facility](3 previous were done 20 yrs before)  . cataracts Bilateral   . COLONOSCOPY    . CYSTOSCOPY  07/21/2013   Dr. Jeffie Pollock  . DENTAL SURGERY     with sedation  . ESOPHAGEAL MANOMETRY N/A 09/20/2015   Procedure: ESOPHAGEAL MANOMETRY (EM);  Surgeon: Manus Gunning, MD;  Location: [facility];  Service: Gastroenterology;  Laterality: N/A;  . LEFT HEART CATH AND CORONARY ANGIOGRAPHY N/A 09/15/2017   Procedure: LEFT HEART CATH AND  CORONARY ANGIOGRAPHY;  Surgeon: Martinique, Peter M, MD;  Location: [facility];  Service: Cardiovascular;  Laterality: N/A;  . LUMBAR LAMINECTOMY/DECOMPRESSION MICRODISCECTOMY N/A 01/12/2015   Procedure: LUMBAR LAMINECTOMY/DECOMPRESSION MICRODISCECTOMY 3 LEVELS;  Surgeon: Phylliss Bob, MD;  Location: [facility];  Service: Orthopedics;  Laterality: N/A;  Lumbar 3-4, lumbar 4-5, lumbar 5-sacrum 1 decompression  . parathyroid adenoma removed    . POLYPECTOMY    . ROTATOR CUFF REPAIR     bilateral  . TONSILLECTOMY    . UPPER GASTROINTESTINAL ENDOSCOPY      Social History   Socioeconomic History  . Marital status: Single    Spouse name: Not on file  . Number of children: 1  . Years of education: Not on file  . Highest education level: Not on file  Occupational History  . Occupation: Network engineer OF [employer]    Employer: [employer]  Social Needs  . Financial resource strain: Not on file  . Food insecurity:    Worry: Not on file    Inability: Not on file  . Transportation needs:    Medical: Not on file    Non-medical: Not on file  Tobacco Use  . Smoking status: Current Every Kaine Smoker    Packs/[place]: 1.50    Years: 58.00    Pack years: 87.00    Types: Cigarettes  . Smokeless tobacco: Never Used  Substance and Sexual Activity  . Alcohol use: Yes    Alcohol/week: 8.4 oz    Types: 14 Standard drinks or equivalent per week    Comment: every Danser, vodka- 1-2 a Zbikowski   . Drug use: No  . Sexual activity: Not on file  Lifestyle  . Physical activity:    Days per week: Not on file    Minutes per session: Not on file  . Stress: Not on file  Relationships  . Social connections:    Talks on phone: Not on file    Gets together: Not on file    Attends religious service: Not on file    Active member of club or organization: Not on file    Attends meetings of clubs or organizations: Not on file    Relationship status: Not on file  . Intimate partner violence:    Fear of current or ex  partner: Not on file    Emotionally abused: Not on file    Physically abused: Not on file    Forced sexual activity: Not on file  Other Topics Concern  . Not on file  Social History Narrative  . Not on file    Allergies  Allergen Reactions  . Ivp Dye [Iodinated Diagnostic Agents] Hives and Shortness Of Breath    reaction was when pt was 77 years old.  Clancy Gourd [Nitrofurantoin] Other (See Comments)    Fever that lasted several months  Family History  Problem Relation Age of Onset  . Stroke Mother   . Rheum arthritis Mother   . Heart disease Father   . Colon cancer Unknown        ? mat. uncle died age 23  . Colon polyps Neg Hx   . Esophageal cancer Neg Hx   . Rectal cancer Neg Hx   . Stomach cancer Neg Hx     Prior to Admission medications   Medication Sig Start Date End Date Taking? Authorizing Provider  acetaminophen (TYLENOL 8 HOUR) 650 MG CR tablet Take 650 mg by mouth every 8 (eight) hours as needed for pain.   Yes [provider]  albuterol (PROVENTIL HFA;VENTOLIN HFA) 108 (90 BASE) MCG/ACT inhaler Inhale 2 puffs into the lungs every 6 (six) hours as needed for wheezing or shortness of breath. Reported on 11/29/2015   Yes [provider]  atorvastatin (LIPITOR) 80 MG tablet Take 80 mg by mouth daily.    Yes [provider]  clopidogrel (PLAVIX) 75 MG tablet Take 75 mg by mouth daily.   Yes [provider]  DULoxetine (CYMBALTA) 60 MG capsule Take 60 mg by mouth daily.   Yes [provider]  feeding supplement, ENSURE ENLIVE, (ENSURE ENLIVE) LIQD Take 237 mLs by mouth 2 (two) times daily between meals. 09/25/17  Yes Oswald Hillock, MD  fenofibrate (TRICOR) 145 MG tablet Take 145 mg by mouth daily.    Yes [provider]  folic acid (FOLVITE) 1 MG tablet Take 1 tablet (1 mg total) by mouth daily. 09/19/17  Yes Sheikh, Omair Latif, DO  furosemide (LASIX) 40 MG tablet Take 40 mg by mouth daily. 11/13/17  Yes [provider]  guaiFENesin (MUCINEX) 600 MG 12 hr tablet Take 600 mg by mouth as needed for cough or to loosen phlegm.   Yes [provider]  loperamide (IMODIUM) 2 MG capsule Take 1-2 capsules (2-4 mg total) by mouth as needed for diarrhea or loose stools. 09/18/17  Yes Sheikh, Omair Latif, DO  metoprolol succinate (TOPROL-XL) 50 MG 24 hr tablet Take 1 tablet (50 mg total) by mouth daily. Take with or immediately following a meal. 09/19/17  Yes Duke, Tami Lin, PA  midodrine (PROAMATINE) 2.5 MG tablet Take 1 tablet (2.5 mg total) by mouth 3 (three) times daily with meals. 09/25/17  Yes Lama, Marge Duncans, MD  MYRBETRIQ 50 MG TB24 tablet Take 50 mg by mouth daily.  05/03/15  Yes [provider]  NITROSTAT 0.4 MG SL tablet Place 0.4 mg under the tongue every 5 (five) minutes as needed for chest pain. Reported on 11/29/2015 05/29/13  Yes [provider]  omeprazole (PRILOSEC) 40 MG capsule TAKE ONE CAPSULE BY MOUTH TWICE DAILY 04/22/16  Yes Armbruster, Carlota Raspberry, MD  QUEtiapine (SEROQUEL) 25 MG tablet Take 1 tablet (25 mg total) by mouth at bedtime. 09/25/17  Yes Oswald Hillock, MD  SYMBICORT 160-4.5 MCG/ACT inhaler INHALE TWO PUFFS INTO LUNGS TWICE DAILY 04/19/14  Yes Clance, Armando Reichert, MD  thiamine 100 MG tablet Take 1 tablet (100 mg total) by mouth daily. 09/19/17  Yes Sheikh, Omair Latif, DO  tiZANidine (ZANAFLEX) 4 MG capsule Take 4 mg by mouth 3 (three) times daily as needed for muscle spasms.    Yes [provider]  vitamin B-12 (CYANOCOBALAMIN) 1000 MCG tablet Take 1 tablet (1,000 mcg total) by mouth daily. 09/25/17  Yes Oswald Hillock, MD  VOLTAREN 1 % GEL Apply 4 g  topically 4 (four) times daily as needed for pain. 09/11/17   [provider]    Physical Exam: Vitals:   11/21/17 1245 11/21/17 1300 11/21/17 1441 11/21/17 1744  BP: (!) 132/92 136/77 124/83 (!) 155/89  Pulse: (!) 106 (!) 103 98 88  Resp: (!) 22 20 19 20   Temp:      TempSrc:      SpO2: (!) 86%  97% 97% 99%  Weight:      Height:         General:  Appears slightly anxious thin and frail chronically ill in no acute distress Eyes:  PERRL, EOMI, normal lids, iris ENT:  grossly normal hearing, lips & tongue, mucous membranes of his mouth are pink but dry Neck:  no LAD, masses or thyromegaly Cardiovascular:  RRR, no m/r/g.  2+ LE edema.  Respiratory:  Normal effort. Respirations slightly shallow. Decreased breath sounds diffuse rhonchi and faint crackles particularly in bases Abdomen:  soft, slightly distended sluggish bowel sounds is tenderness to palpation Skin:  no rash or induration seen on limited exam Musculoskeletal:  grossly normal tone BUE/BLE, good ROM, no bony abnormality Psychiatric:  grossly normal mood and affect, speech fluent and appropriate, AOx3 Neurologic:  CN 2-12 grossly intact, moves all extremities in coordinated fashion, sensation intact alert and oriented 3 speech clear  Labs on Admission: I have personally reviewed following labs and imaging studies  CBC: Recent Labs  Lab 11/21/17 1000  WBC 28.6*  NEUTROABS 25.1*  HGB 10.1*  HCT 29.5*  MCV 93.1  PLT 937*   Basic Metabolic Panel: Recent Labs  Lab 11/21/17 1000 11/21/17 1109  NA 130*  --   K 3.4*  --   CL 92*  --   CO2 26  --   GLUCOSE 104*  --   BUN 15  --   CREATININE 1.35*  --   CALCIUM 8.1*  --   MG  --  1.0*   GFR: Estimated Creatinine Clearance: 41.6 mL/min (A) (by C-G formula based on SCr of 1.35 mg/dL (H)). Liver Function Tests: Recent Labs  Lab 11/21/17 1000  AST 40  ALT 26  ALKPHOS 70  BILITOT 1.0  PROT 7.2  ALBUMIN 2.5*   Recent Labs  Lab 11/21/17 1000  LIPASE 21   No results for input(s): AMMONIA in the last 168 hours. Coagulation Profile: Recent Labs  Lab 11/21/17 1109  INR 1.23   Cardiac Enzymes: No results for input(s): CKTOTAL, CKMB, CKMBINDEX, TROPONINI in the last 168 hours. BNP (last 3 results) No results for input(s): PROBNP in the last 8760  hours. HbA1C: No results for input(s): HGBA1C in the last 72 hours. CBG: No results for input(s): GLUCAP in the last 168 hours. Lipid Profile: No results for input(s): CHOL, HDL, LDLCALC, TRIG, CHOLHDL, LDLDIRECT in the last 72 hours. Thyroid Function Tests: No results for input(s): TSH, T4TOTAL, FREET4, T3FREE, THYROIDAB in the last 72 hours. Anemia Panel: No results for input(s): VITAMINB12, FOLATE, FERRITIN, TIBC, IRON, RETICCTPCT in the last 72 hours. Urine analysis:    Component Value Date/Time   COLORURINE AMBER (A) 11/21/2017 1215   APPEARANCEUR HAZY (A) 11/21/2017 1215   LABSPEC 1.019 11/21/2017 1215   PHURINE 5.0 11/21/2017 1215   GLUCOSEU NEGATIVE 11/21/2017 1215   HGBUR NEGATIVE 11/21/2017 1215   BILIRUBINUR NEGATIVE 11/21/2017 1215   KETONESUR NEGATIVE 11/21/2017 1215   PROTEINUR 30 (A) 11/21/2017 1215   UROBILINOGEN 1.0 01/09/2015 1427   NITRITE NEGATIVE 11/21/2017 1215   LEUKOCYTESUR NEGATIVE  11/21/2017 1215    Creatinine Clearance: Estimated Creatinine Clearance: 41.6 mL/min (A) (by C-G formula based on SCr of 1.35 mg/dL (H)).  Sepsis Labs: @LABRCNTIP (procalcitonin:4,lacticidven:4) )No results found for this or any previous visit (from the past 240 hour(s)).   Radiological Exams on Admission: Ct Abdomen Pelvis Wo Contrast  Result Date: 11/21/2017 CLINICAL DATA:  Lower abdominal pain for several weeks.  Diarrhea. EXAM: CT ABDOMEN AND PELVIS WITHOUT CONTRAST TECHNIQUE: Multidetector CT imaging of the abdomen and pelvis was performed following the standard protocol without IV contrast. COMPARISON:  05/01/2017 FINDINGS: Lower chest: Pericardial effusion measuring up to 16 mm in cross-section. Mild calcific atherosclerotic disease of the coronary arteries. Dense airspace consolidation in the left lower lobe, incompletely visualized with an area of decreased attenuation in the dependent portion of the lung which may represent loculated pleural effusion or area of  necrosis. Hepatobiliary: Stable liver cyst. Sludge/gallstones within the dependent portion of the gallbladder. Pancreas: Unremarkable. No pancreatic ductal dilatation or surrounding inflammatory changes. Spleen: Normal in size without focal abnormality. Adrenals/Urinary Tract: Normal adrenal glands. Normal left kidney. Large but benign-appearing right renal cysts, the larger of the 2 measuring 3.5 cm. Stomach/Bowel: Stomach is within normal limits. Post appendectomy. No evidence of bowel wall thickening, distention, or inflammatory changes. Vascular/Lymphatic: Aortic atherosclerosis. No enlarged abdominal or pelvic lymph nodes., Reproductive:  Prostate is unremarkable. Other: No abdominal wall hernia or abnormality. No abdominopelvic ascites. Musculoskeletal: L4-S1 posterior fusion. IMPRESSION: Dense airspace consolidation of the left lower lobe of the lung with hypoattenuated area posteriorly which may represent loculated pleural effusion or area of necrosis. Associated left pleural effusion. Moderate in size pericardial effusion. No evidence of acute abnormalities within the abdomen or pelvis. Electronically Signed   By: Fidela Salisbury M.D.   On: 11/21/2017 16:03   Dg Chest 2 View  Result Date: 11/21/2017 CLINICAL DATA:  Abdominal pain and hypertension. Shortness of breath. EXAM: CHEST - 2 VIEW COMPARISON:  September 21, 2017 FINDINGS: There is left pleural effusion with consolidation in portions of the inferior lingula and left lower lobe. Right lung is clear except for slight right base atelectasis. Heart is upper normal in size with pulmonary vascularity within normal limits. No adenopathy. There is aortic atherosclerosis. There is evidence of old trauma involving the lateral left clavicle. IMPRESSION: Left pleural effusion with consolidation in portions of the lingula and left lower lobe. Slight right base atelectasis. Lungs elsewhere clear. Heart upper normal in size. No evident adenopathy. There is  aortic atherosclerosis. Aortic Atherosclerosis (ICD10-I70.0). Electronically Signed   By: Lowella Grip III M.D.   On: 11/21/2017 10:29   Dg Chest Port 1 View  Result Date: 11/21/2017 CLINICAL DATA:  Shortness of breath and chest pain EXAM: PORTABLE CHEST 1 VIEW COMPARISON:  November 21, 2017 FINDINGS: There is consolidation throughout the left mid lower lung zones with left pleural effusion. Right lung is clear. Heart is mildly enlarged with pulmonary vascularity within normal limits. No adenopathy. There is aortic atherosclerosis. There are surgical clips to the left of the paratracheal region at the level of T2. There is evidence of old trauma involving the lateral left clavicle. IMPRESSION: Airspace consolidation most indicative of pneumonia throughout the left mid and lower lung zones with left pleural effusion. Right lung clear. Stable cardiomegaly. There is aortic atherosclerosis. Aortic Atherosclerosis (ICD10-I70.0). Electronically Signed   By: Lowella Grip III M.D.   On: 11/21/2017 12:04    EKG: Sinus tachycardia Multiform ventricular premature complexes Anterior infarct, old  Assessment/Plan Principal  Problem:   Acute respiratory failure (HCC) Active Problems:   Coronary atherosclerosis   GERD   BARRETTS ESOPHAGUS   Essential hypertension, benign   Cigarette smoker   Chronic back pain   Takotsubo cardiomyopathy   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   Tobacco abuse   ETOH abuse   HCAP (healthcare-associated pneumonia)   Sepsis ([place])   Pleural effusion   Acute kidney injury ([place])   Abdominal pain   #1. Acute respiratory failure likely related to healthcare associated pneumonia and pleural effusion in setting of  COPD and current tobacco use. Oxygen  saturation level 86% on room air. He has leukocytosis max temp 101.9 rectally tachycardia tachypnea hypoxia. Lactic acid within the limits of normal hemodynamically stable at the time of admission. CT  abdomen pelvis reveals dense airspace consolidation of the left lower lobe of the lung with hypoattenuated area posteriorly which may represent loculated pleural effusion or area of necrosis. Associated left pleural effusion or area of necrosis. He is provided with 500 mL IV fluids and broad-spectrum antibiotics. -Admit to step down -Continue IV antibiotics -Follow blood culture -Track lactic acid -Sputum culture as able -strep pneumo urine antigen -Oxygen supplementation -Scheduled nebulizers  -wean oxygen as able -pulmonary consult requested evaluation loculated pleural effusion  #2. Healthcare associated pneumonia. See #1. -Track lactic acid -Blood cultures -Sputum culture as able -Strep pneumo urine antigen -IV antibiotics per protocol -Supportive therapy  #3. Sepsis. Patient  with leukocytosis tachycardia tachypnea hypoxia acute kidney injury related to #2. Some concern for aspiration as well given his chronic EtOH abuse. Is hemodynamically stable at the time of admission. Valenza panel negative Of note patient received 500 mL of IV fluids versus to 30 mL/kg given his history of Takutsubo's. Reportedly recent echo reveals improvement in EF. Unable to locate results of most recent echo.  -Track lactic acid -antibiotics as noted above -follow blood culture -Supportive therapy -See above  #4. Acute kidney injury/hyponatremia. Mild. Yesterday 1.3 on admission. Sodium 130.this appears close to his baseline.home medications include Lasix, -Monitor -Hold nephrotoxins as able -Monitor urine output -Recheck in the morning  5. COPD. Does not wear oxygen at home. Home meds include Symbicort. X-rays noted above. -Scheduled nebs -Oxygen supplementation -See #1  #4.abdominal pain. CT of abdomen pelvis as noted above. She reports early satiety crease appetite unintentional weight loss and tobacco use. Patient with a history of Barrett's esophagus. Review indicates he had an EGD March 2017  revealing irregular mucosa in the esophagus with erythematous mucosa in the gastric body with an interval healing of previously documented gastric all*and gastritis with normal duodenal bulb. Abs: Pathology benign for malignancy or H. Pylori area. Also of note similar 2018 at 136 pounds currently 135 pounds. -Clear liquids -Supportive therapy -Continue PPI -requested gi consult as concern for gastric malignancy  .5. EtOH use. He reports 2 glasses of "Liquor" per [place]. No signs withdrawal -CIWA protocol -monitor  #6.Takotsubo cardiomyopathy. Chest pain. EKG as noted above.  Patient appears dehydrated at presentation. Echo done in January reveals an EF of 30-35%. reportedly had a more recent echo which shows an improvement in EF. I was unable to find result -hold Lasix -Monitor intake and output -Daily weights  #7. Tobacco use -Cessation counseling offered -15 patch  #8. Hypertension. Controlled in the emergency department.home meds include Lasix, Toprol,midodrine -holding lasix and metoprolol -midodrine  #9. Hypomagnisemia. Mag 1.Marland Kitchen0 -replete -recheck  #10. Cad. Chest pain. Initial troponin negative. EKG as noted above. Chart  review indicates moderate nonobstructive disease managed medically. -Continue home meds as noted above      DVT prophylaxis: plavix  Code Status: full  Family Communication: none present  Disposition Plan: home  Consults called: gi, [facility]  Admission status: inpatient    Radene Gunning MD [facility]  If 7PM-7AM, please contact night-coverage [email or web address] Password [email or web address]  11/21/2017, 5:56 PM

## 2017-11-21 NOTE — Progress Notes (Signed)
Pharmacy Antibiotic Note  Brad Turner is a 77 y.o. male admitted on 11/21/2017 with sepsis.  Pharmacy has been consulted for vancomycin and zosyn dosing. Pt presents with progressively worsening generalized abdominal pain. WBC elevated at 28.6. LA 1.44. SCr acutely elevated at 1.35. CrCl ~ 40-45 mL/min. Patient has received a dose of vancomycin and Zosyn in the ED  Plan: -Vancomycin 500 mg IV Q 12 hours -Zosyn 3.375 gm IV Q 8 hours (EI infusion) -Monitor CBC, renal fx, cultures and clinical progress -VT at SS   Height: 5\' 5"  (165.1 cm) Weight: 135 lb (61.2 kg) IBW/kg (Calculated) : 61.5  Temp (24hrs), Avg:98.9 F (37.2 C), Min:98.9 F (37.2 C), Max:98.9 F (37.2 C)  Recent Labs  Lab 11/21/17 1000  WBC 28.6*  CREATININE 1.35*    Estimated Creatinine Clearance: 41.6 mL/min (A) (by C-G formula based on SCr of 1.35 mg/dL (H)).    Allergies  Allergen Reactions  . Ivp Dye [Iodinated Diagnostic Agents] Hives and Shortness Of Breath    reaction was when pt was 77 years old.  Clancy Gourd [Nitrofurantoin] Other (See Comments)    Fever that lasted several months    Antimicrobials this admission: Vanc 3/22 >>  Zosyn 3/22 >>   Dose adjustments this admission: None   Microbiology results:  Thank you for allowing pharmacy to be a part of this patient's care.  Albertina Parr, PharmD., BCPS Clinical Pharmacist Clinical phone for 11/21/17 until 3:30pm: [phone] If after 3:30pm, please call main pharmacy at: [phone]

## 2017-11-21 NOTE — ED Triage Notes (Addendum)
PT reports generalized abdominal pain that started a few days ago, states his last BM was also a few days ago. Does endorse back pain. Denies chest pain. Denies urinary symptoms. States "not much" when asked if he has any nausea.  Pt abdomen is tender to palpation and he states it feels swollen. Upon assessment pt ankles are swollen and tight. Pt also appears pale.

## 2017-11-21 NOTE — ED Notes (Addendum)
Pt c/o pain in lower abd for approx 2 weeks, worse in past 2 days-- pt is pale, states he is having diarrhea like stools- last one yesterday. C/o aching everywhere "Every bone in my body is sore"  Pt has had recent hospitalizations in January

## 2017-11-21 NOTE — ED Provider Notes (Addendum)
[facility] PROGRESSIVE CARE Provider Note   CSN: [id number] Arrival date & time: 11/21/17  5956     History   Chief Complaint Chief Complaint  Patient presents with  . Abdominal Pain  . Back Pain    HPI Brad Turner is a 77 y.o. male.  HPI   77 year old male with extensive past medical history of chronic alcoholism, coronary disease, COPD with chronic alcohol use, recent Takutsubo cardiomyopathy here with severe abdominal pain.  Patient states that over the last several days, he has had progressively worsening generalized abdominal pain.  He has associated abdominal fullness.  He describes aching, throbbing, severe pain in his abdomen.  This is worse with palpation and eating.  He is continue to use alcohol.  He denies any associated chest pain.  He does have shortness of breath that has been persistent since his recent CM but denies any CP. He's noticed increased swelling of his bilateral legs as well, but this has also been constant. No alleviating factors. He's had a cough as well, productive of yellow-green sputum.  Past Medical History:  Diagnosis Date  . Alcohol withdrawal ([place])   . Allergy    seasonal  . Arthritis   . Barrett esophagus   . BPH (benign prostatic hyperplasia)   . CAD (coronary artery disease)    a. s/p multiple POBA last in 2011 to distal OM2. b. Takotsubo event 09/2017 with cath showing no culprit, 25% prox LAD, 60% OM1, 65% OM2, EF 35-40% by cath and 30-35% by echo.  . Chronic back pain   . COPD (chronic obstructive pulmonary disease) (HCC)    Albuterol inhaler as needed.Uses Symbicort daily  . Depression   . Emphysema lung ([place])   . Fall    fell on 10/06/16 with loss of consiousness for a short time.States he remembers getting up but remembers nothing else.  . Gastric ulcer   . Gastritis   . GERD (gastroesophageal reflux disease)    takes Omeprazole daily  . History of colon polyps    benign  . History of kidney stones   . Hyperlipidemia     . Hypertension   . Hyponatremia   . Joint pain   . Joint swelling   . 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 Neuropathy   . Nocturia   . Pneumonia    hx of  . PONV (postoperative nausea and vomiting)   . Skin cancer   . Stroke [facility] 1995   Affected balance; and has very emotional if watching a movie  . Syncope    yr ago  . Takotsubo cardiomyopathy    a. 09/2017 - EF 35-40% by cath, 30-35% by echo.  . Urgency of urination    takes Myrbetriq daily    Patient Active Problem List   Diagnosis Date Noted  . ETOH abuse 11/21/2017  . Acute respiratory failure ([place]) 11/21/2017  . HCAP (healthcare-associated pneumonia) 11/21/2017  . Sepsis ([place]) 11/21/2017  . Pleural effusion 11/21/2017  . Acute kidney injury ([place]) 11/21/2017  . Abdominal pain 11/21/2017  . Malnutrition of moderate degree 09/24/2017  . Acute delirium 09/16/2017  . Acute hyponatremia 09/16/2017  . Alcohol withdrawal ([place]) 09/16/2017  . 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 Tobacco abuse 09/16/2017  . Depression 09/16/2017  . History of CVA (cerebrovascular accident) 09/16/2017  . History of Barrett's esophagus 09/16/2017  . Takotsubo cardiomyopathy 09/15/2017  .  Chronic back pain 12/01/2016  . S/P lumbar spinal fusion 12/01/2016  . Dysthymia 12/01/2016  . Radiculopathy 10/17/2016  . COPD mixed type ([place]) 06/10/2016  . Abnormal CT of the chest 06/10/2016  . Dysphagia   . Cigarette smoker 06/07/2015  . Essential hypertension, benign 10/30/2014    Class: Chronic  . Syncope 10/29/2014  . Cough syncope 10/07/2014  . COPD exacerbation ([place]) 09/29/2014  . Pulmonary nodules 11/22/2013  . Chest pain, unspecified 10/15/2013  . Personal history of colonic polyps 06/04/2010  . COPD (chronic obstructive pulmonary disease) with emphysema ([place]) 02/26/2010  . CHEST PAIN, ATYPICAL 02/26/2010  . BARRETTS ESOPHAGUS 05/18/2009  . HYPERLIPIDEMIA  04/28/2008  . Coronary atherosclerosis 04/28/2008  . CVA 04/28/2008  . GERD 04/28/2008  . ARTHRITIS 04/28/2008  . NEPHROLITHIASIS, HX OF 04/28/2008    Past Surgical History:  Procedure Laterality Date  . APPENDECTOMY    . CARDIAC CATHETERIZATION     x 4. Most recent one in 2011 at [facility](3 previous were done 20 yrs before)  . cataracts Bilateral   . COLONOSCOPY    . CYSTOSCOPY  07/21/2013   Dr. Jeffie Pollock  . DENTAL SURGERY     with sedation  . ESOPHAGEAL MANOMETRY N/A 09/20/2015   Procedure: ESOPHAGEAL MANOMETRY (EM);  Surgeon: Manus Gunning, MD;  Location: [facility];  Service: Gastroenterology;  Laterality: N/A;  . LEFT HEART CATH AND CORONARY ANGIOGRAPHY N/A 09/15/2017   Procedure: LEFT HEART CATH AND CORONARY ANGIOGRAPHY;  Surgeon: Martinique, Peter M, MD;  Location: [facility];  Service: Cardiovascular;  Laterality: N/A;  . LUMBAR LAMINECTOMY/DECOMPRESSION MICRODISCECTOMY N/A 01/12/2015   Procedure: LUMBAR LAMINECTOMY/DECOMPRESSION MICRODISCECTOMY 3 LEVELS;  Surgeon: Phylliss Bob, MD;  Location: [facility];  Service: Orthopedics;  Laterality: N/A;  Lumbar 3-4, lumbar 4-5, lumbar 5-sacrum 1 decompression  . parathyroid adenoma removed    . POLYPECTOMY    . ROTATOR CUFF REPAIR     bilateral  . TONSILLECTOMY    . UPPER GASTROINTESTINAL ENDOSCOPY         Home Medications    Prior to Admission medications   Medication Sig Start Date End Date Taking? Authorizing Provider  acetaminophen (TYLENOL 8 HOUR) 650 MG CR tablet Take 650 mg by mouth every 8 (eight) hours as needed for pain.   Yes [provider]  albuterol (PROVENTIL HFA;VENTOLIN HFA) 108 (90 BASE) MCG/ACT inhaler Inhale 2 puffs into the lungs every 6 (six) hours as needed for wheezing or shortness of breath. Reported on 11/29/2015   Yes [provider]  atorvastatin (LIPITOR) 80 MG tablet Take 80 mg by mouth daily.    Yes [provider]  clopidogrel (PLAVIX) 75 MG tablet Take 75 mg by  mouth daily.   Yes [provider]  DULoxetine (CYMBALTA) 60 MG capsule Take 60 mg by mouth daily.   Yes [provider]  feeding supplement, ENSURE ENLIVE, (ENSURE ENLIVE) LIQD Take 237 mLs by mouth 2 (two) times daily between meals. 09/25/17  Yes Oswald Hillock, MD  fenofibrate (TRICOR) 145 MG tablet Take 145 mg by mouth daily.    Yes [provider]  folic acid (FOLVITE) 1 MG tablet Take 1 tablet (1 mg total) by mouth daily. 09/19/17  Yes Sheikh, Omair Latif, DO  furosemide (LASIX) 40 MG tablet Take 40 mg by mouth daily. 11/13/17  Yes [provider]  guaiFENesin (MUCINEX) 600 MG 12 hr tablet Take 600 mg by mouth as needed for cough or to loosen phlegm.   Yes  [provider]  loperamide (IMODIUM) 2 MG capsule Take 1-2 capsules (2-4 mg total) by mouth as needed for diarrhea or loose stools. 09/18/17  Yes Sheikh, Omair Latif, DO  metoprolol succinate (TOPROL-XL) 50 MG 24 hr tablet Take 1 tablet (50 mg total) by mouth daily. Take with or immediately following a meal. 09/19/17  Yes Duke, Tami Lin, PA  midodrine (PROAMATINE) 2.5 MG tablet Take 1 tablet (2.5 mg total) by mouth 3 (three) times daily with meals. 09/25/17  Yes Lama, Marge Duncans, MD  MYRBETRIQ 50 MG TB24 tablet Take 50 mg by mouth daily.  05/03/15  Yes [provider]  NITROSTAT 0.4 MG SL tablet Place 0.4 mg under the tongue every 5 (five) minutes as needed for chest pain. Reported on 11/29/2015 05/29/13  Yes [provider]  omeprazole (PRILOSEC) 40 MG capsule TAKE ONE CAPSULE BY MOUTH TWICE DAILY 04/22/16  Yes Armbruster, Carlota Raspberry, MD  QUEtiapine (SEROQUEL) 25 MG tablet Take 1 tablet (25 mg total) by mouth at bedtime. 09/25/17  Yes Oswald Hillock, MD  SYMBICORT 160-4.5 MCG/ACT inhaler INHALE TWO PUFFS INTO LUNGS TWICE DAILY 04/19/14  Yes Clance, Armando Reichert, MD  thiamine 100 MG tablet Take 1 tablet (100 mg total) by mouth daily. 09/19/17  Yes Sheikh, Omair Latif, DO  tiZANidine (ZANAFLEX) 4 MG  capsule Take 4 mg by mouth 3 (three) times daily as needed for muscle spasms.    Yes [provider]  vitamin B-12 (CYANOCOBALAMIN) 1000 MCG tablet Take 1 tablet (1,000 mcg total) by mouth daily. 09/25/17  Yes Lama, Marge Duncans, MD  VOLTAREN 1 % GEL Apply 4 g topically 4 (four) times daily as needed for pain. 09/11/17   [provider]    Family History Family History  Problem Relation Age of Onset  . Stroke Mother   . Rheum arthritis Mother   . Heart disease Father   . Colon cancer Unknown        ? mat. uncle died age 27  . Colon polyps Neg Hx   . Esophageal cancer Neg Hx   . Rectal cancer Neg Hx   . Stomach cancer Neg Hx     Social History Social History   Tobacco Use  . Smoking status: Current Every Henner Smoker    Packs/[place]: 1.50    Years: 58.00    Pack years: 87.00    Types: Cigarettes  . Smokeless tobacco: Never Used  Substance Use Topics  . Alcohol use: Yes    Alcohol/week: 8.4 oz    Types: 14 Standard drinks or equivalent per week    Comment: every [place], vodka- 1-2 a [place]   . Drug use: No     Allergies   Ivp dye [iodinated diagnostic agents] and Macrodantin [nitrofurantoin]   Review of Systems Review of Systems  Constitutional: Positive for fatigue. Negative for chills and fever.  HENT: Negative for congestion and rhinorrhea.   Eyes: Negative for visual disturbance.  Respiratory: Positive for cough and shortness of breath. Negative for wheezing.   Cardiovascular: Negative for chest pain and leg swelling.  Gastrointestinal: Positive for abdominal distention, abdominal pain, nausea and vomiting. Negative for diarrhea.  Genitourinary: Negative for dysuria and flank pain.  Musculoskeletal: Negative for neck pain and neck stiffness.  Skin: Negative for rash and wound.  Allergic/Immunologic: Negative for immunocompromised state.  Neurological: Positive for weakness. Negative for syncope and headaches.  All other systems reviewed and are  negative.    Physical Exam Updated Vital Signs  BP 114/73 (BP Location: Right Arm)   Pulse 88   Temp (!) 97.5 F (36.4 C) (Oral)   Resp 20   Ht 5\' 5"  (1.651 m)   Wt 61.2 kg (134 lb 14.7 oz)   SpO2 99%   BMI 22.45 kg/m   Physical Exam  Constitutional: He is oriented to person, place, and time. He appears well-developed and well-nourished. He appears ill. He appears distressed.  HENT:  Head: Normocephalic and atraumatic.  Eyes: Conjunctivae are normal.  Neck: Neck supple.  Cardiovascular: Normal rate, regular rhythm and normal heart sounds. Exam reveals no friction rub.  No murmur heard. Pulmonary/Chest: Effort normal. No respiratory distress. He has decreased breath sounds. He has no wheezes. He has rhonchi in the left middle field and the left lower field. He has rales in the left lower field.  Abdominal: Normal appearance. He exhibits distension. Bowel sounds are decreased. There is generalized tenderness. There is rebound and guarding. There is no rigidity.  Musculoskeletal: He exhibits no edema.  2+ pitting edema b/l LE  Neurological: He is alert and oriented to person, place, and time. He exhibits normal muscle tone.  Skin: Skin is warm. Capillary refill takes less than 2 seconds.  Psychiatric: He has a normal mood and affect.  Nursing note and vitals reviewed.    ED Treatments / Results  Labs (all labs ordered are listed, but only abnormal results are displayed) Labs Reviewed  COMPREHENSIVE METABOLIC PANEL - Abnormal; Notable for the following components:      Result Value   Sodium 130 (*)    Potassium 3.4 (*)    Chloride 92 (*)    Glucose, Bld 104 (*)    Creatinine, Ser 1.35 (*)    Calcium 8.1 (*)    Albumin 2.5 (*)    GFR calc non Af Amer 50 (*)    GFR calc Af Amer 58 (*)    All other components within normal limits  CBC - Abnormal; Notable for the following components:   WBC 28.6 (*)    RBC 3.17 (*)    Hemoglobin 10.1 (*)    HCT 29.5 (*)    Platelets  758 (*)    All other components within normal limits  URINALYSIS, ROUTINE W REFLEX MICROSCOPIC - Abnormal; Notable for the following components:   Color, Urine AMBER (*)    APPearance HAZY (*)    Protein, ur 30 (*)    Squamous Epithelial / LPF 0-5 (*)    All other components within normal limits  BRAIN NATRIURETIC PEPTIDE - Abnormal; Notable for the following components:   B Natriuretic Peptide 184.6 (*)    All other components within normal limits  PROTIME-INR - Abnormal; Notable for the following components:   Prothrombin Time 15.4 (*)    All other components within normal limits  MAGNESIUM - Abnormal; Notable for the following components:   Magnesium 1.0 (*)    All other components within normal limits  DIFFERENTIAL - Abnormal; Notable for the following components:   Neutro Abs 25.1 (*)    Monocytes Absolute 1.4 (*)    All other components within normal limits  IRON AND TIBC - Abnormal; Notable for the following components:   Iron 9 (*)    TIBC 171 (*)    Saturation Ratios 5 (*)    All other components within normal limits  FERRITIN - Abnormal; Notable for the following components:   Ferritin 386 (*)    All other components within normal limits  RETICULOCYTES - Abnormal; Notable for the following components:   RBC. 2.73 (*)    All other components within normal limits  CULTURE, BLOOD (ROUTINE X 2)  CULTURE, BLOOD (ROUTINE X 2)  URINE CULTURE  CULTURE, EXPECTORATED SPUTUM-ASSESSMENT  GRAM STAIN  MRSA PCR SCREENING  LIPASE, BLOOD  INFLUENZA PANEL BY PCR (TYPE A & B)  STREP PNEUMONIAE URINARY ANTIGEN  LACTIC ACID, PLASMA  VITAMIN B12  FOLATE  LACTIC ACID, PLASMA  CBC  MAGNESIUM  FOLATE RBC  RPR  LEGIONELLA PNEUMOPHILA SEROGP 1 UR AG  COMPREHENSIVE METABOLIC PANEL  LACTATE DEHYDROGENASE  I-STAT TROPONIN, ED  I-STAT CG4 LACTIC ACID, ED  I-STAT CG4 LACTIC ACID, ED    EKG  EKG Interpretation  Date/Time:  Friday November 21 2017 10:01:29 EDT Ventricular Rate:   115 PR Interval:  166 QRS Duration: 72 QT Interval:  338 QTC Calculation: 467 R Axis:   32 Text Interpretation:  Sinus tachycardia T wave abnormality, consider lateral ischemia Abnormal ECG new TWI in i, aVL - baseline wander, but no obvious STEMI Confirmed by Merrily Pew [phone]) on 11/21/2017 10:09:56 AM       Radiology Ct Abdomen Pelvis Wo Contrast  Result Date: 11/21/2017 CLINICAL DATA:  Lower abdominal pain for several weeks.  Diarrhea. EXAM: CT ABDOMEN AND PELVIS WITHOUT CONTRAST TECHNIQUE: Multidetector CT imaging of the abdomen and pelvis was performed following the standard protocol without IV contrast. COMPARISON:  05/01/2017 FINDINGS: Lower chest: Pericardial effusion measuring up to 16 mm in cross-section. Mild calcific atherosclerotic disease of the coronary arteries. Dense airspace consolidation in the left lower lobe, incompletely visualized with an area of decreased attenuation in the dependent portion of the lung which may represent loculated pleural effusion or area of necrosis. Hepatobiliary: Stable liver cyst. Sludge/gallstones within the dependent portion of the gallbladder. Pancreas: Unremarkable. No pancreatic ductal dilatation or surrounding inflammatory changes. Spleen: Normal in size without focal abnormality. Adrenals/Urinary Tract: Normal adrenal glands. Normal left kidney. Large but benign-appearing right renal cysts, the larger of the 2 measuring 3.5 cm. Stomach/Bowel: Stomach is within normal limits. Post appendectomy. No evidence of bowel wall thickening, distention, or inflammatory changes. Vascular/Lymphatic: Aortic atherosclerosis. No enlarged abdominal or pelvic lymph nodes., Reproductive:  Prostate is unremarkable. Other: No abdominal wall hernia or abnormality. No abdominopelvic ascites. Musculoskeletal: L4-S1 posterior fusion. IMPRESSION: Dense airspace consolidation of the left lower lobe of the lung with hypoattenuated area posteriorly which may represent  loculated pleural effusion or area of necrosis. Associated left pleural effusion. Moderate in size pericardial effusion. No evidence of acute abnormalities within the abdomen or pelvis. Electronically Signed   By: Fidela Salisbury M.D.   On: 11/21/2017 16:03   Dg Chest 2 View  Result Date: 11/21/2017 CLINICAL DATA:  Abdominal pain and hypertension. Shortness of breath. EXAM: CHEST - 2 VIEW COMPARISON:  September 21, 2017 FINDINGS: There is left pleural effusion with consolidation in portions of the inferior lingula and left lower lobe. Right lung is clear except for slight right base atelectasis. Heart is upper normal in size with pulmonary vascularity within normal limits. No adenopathy. There is aortic atherosclerosis. There is evidence of old trauma involving the lateral left clavicle. IMPRESSION: Left pleural effusion with consolidation in portions of the lingula and left lower lobe. Slight right base atelectasis. Lungs elsewhere clear. Heart upper normal in size. No evident adenopathy. There is aortic atherosclerosis. Aortic Atherosclerosis (ICD10-I70.0). Electronically Signed   By: Lowella Grip III M.D.   On: 11/21/2017 10:29   Dg Chest  Port 1 View  Result Date: 11/21/2017 CLINICAL DATA:  Shortness of breath and chest pain EXAM: PORTABLE CHEST 1 VIEW COMPARISON:  November 21, 2017 FINDINGS: There is consolidation throughout the left mid lower lung zones with left pleural effusion. Right lung is clear. Heart is mildly enlarged with pulmonary vascularity within normal limits. No adenopathy. There is aortic atherosclerosis. There are surgical clips to the left of the paratracheal region at the level of T2. There is evidence of old trauma involving the lateral left clavicle. IMPRESSION: Airspace consolidation most indicative of pneumonia throughout the left mid and lower lung zones with left pleural effusion. Right lung clear. Stable cardiomegaly. There is aortic atherosclerosis. Aortic Atherosclerosis  (ICD10-I70.0). Electronically Signed   By: Lowella Grip III M.D.   On: 11/21/2017 12:04    Procedures .Critical Care Performed by: Duffy Bruce, MD Authorized by: Duffy Bruce, MD   Critical care provider statement:    Critical care time (minutes):  45   Critical care time was exclusive of:  Separately billable procedures and treating other patients and teaching time   Critical care was necessary to treat or prevent imminent or life-threatening deterioration of the following conditions:  Sepsis and respiratory failure   Critical care was time spent personally by me on the following activities:  Development of treatment plan with patient or surrogate, discussions with consultants, evaluation of patient's response to treatment, examination of patient, obtaining history from patient or surrogate, ordering and performing treatments and interventions, ordering and review of laboratory studies, ordering and review of radiographic studies, pulse oximetry, re-evaluation of patient's condition and review of old charts   I assumed direction of critical care for this patient from another provider in my specialty: no     (including critical care time)  Medications Ordered in ED Medications  vancomycin (VANCOCIN) 500 mg in sodium chloride 0.9 % 100 mL IVPB (has no administration in time range)  piperacillin-tazobactam (ZOSYN) IVPB 3.375 g (3.375 g Intravenous New Bag/Given 11/21/17 2049)  atorvastatin (LIPITOR) tablet 80 mg (80 mg Oral Not Given 11/21/17 2056)  clopidogrel (PLAVIX) tablet 75 mg (75 mg Oral Given 11/21/17 2056)  DULoxetine (CYMBALTA) DR capsule 60 mg (60 mg Oral Given 12/10/79 1914)  folic acid (FOLVITE) tablet 1 mg (1 mg Oral Not Given 11/21/17 2031)  loperamide (IMODIUM) capsule 2-4 mg (has no administration in time range)  midodrine (PROAMATINE) tablet 2.5 mg (2.5 mg Oral Given 11/21/17 2019)  QUEtiapine (SEROQUEL) tablet 25 mg (25 mg Oral Given 11/21/17 2229)   mometasone-formoterol (DULERA) 200-5 MCG/ACT inhaler 2 puff (2 puffs Inhalation Given 11/21/17 1945)  tiZANidine (ZANAFLEX) tablet 4 mg (4 mg Oral Given 11/21/17 2229)  LORazepam (ATIVAN) tablet 1 mg (has no administration in time range)    Or  LORazepam (ATIVAN) injection 1 mg (has no administration in time range)  thiamine (VITAMIN B-1) tablet 100 mg (100 mg Oral Not Given 11/21/17 2032)    Or  thiamine (B-1) injection 100 mg ( Intravenous See Alternative 11/21/17 2032)  multivitamin with minerals tablet 1 tablet (1 tablet Oral Not Given 11/21/17 2055)  acetaminophen (TYLENOL) tablet 650 mg (has no administration in time range)    Or  acetaminophen (TYLENOL) suppository 650 mg (has no administration in time range)  ondansetron (ZOFRAN) tablet 4 mg (has no administration in time range)    Or  ondansetron (ZOFRAN) injection 4 mg (has no administration in time range)  albuterol (PROVENTIL) (2.5 MG/3ML) 0.083% nebulizer solution 2.5 mg (has no administration in time  range)  magnesium sulfate IVPB 2 g 50 mL (2 g Intravenous New Bag/Given 11/21/17 2112)  HYDROcodone-homatropine (HYCODAN) 5-1.5 MG/5ML syrup 5 mL (5 mLs Oral Given 11/21/17 2054)  guaiFENesin (MUCINEX) 12 hr tablet 1,200 mg (1,200 mg Oral Given 11/21/17 2227)  nicotine (NICODERM CQ - dosed in mg/24 hours) patch 21 mg (21 mg Transdermal Patch Applied 11/21/17 2016)  0.9 %  sodium chloride infusion ( Intravenous New Bag/Given 11/21/17 1957)  albuterol (PROVENTIL) (2.5 MG/3ML) 0.083% nebulizer solution 2.5 mg (has no administration in time range)  sodium chloride 0.9 % bolus 500 mL (0 mLs Intravenous Stopped 11/21/17 1246)  piperacillin-tazobactam (ZOSYN) IVPB 3.375 g (0 g Intravenous Stopped 11/21/17 1217)  vancomycin (VANCOCIN) IVPB 1000 mg/200 mL premix (0 mg Intravenous Stopped 11/21/17 1359)  morphine 4 MG/ML injection 4 mg (4 mg Intravenous Given 11/21/17 1142)  ondansetron (ZOFRAN) injection 4 mg (4 mg Intravenous Given 11/21/17 1142)   Barium Sulfate 2.1 % SUSP (  Duplicate 7/62/83 1517)  acetaminophen (TYLENOL) tablet 1,000 mg (1,000 mg Oral Given 11/21/17 1347)  morphine 4 MG/ML injection 4 mg (4 mg Intravenous Given 11/21/17 1348)  sodium chloride 0.9 % 1,000 mL with thiamine 616 mg, folic acid 1 mg, multivitamins adult 10 mL infusion ( Intravenous New Bag/Given 11/21/17 1348)  magnesium sulfate IVPB 2 g 50 mL (0 g Intravenous Stopped 11/21/17 1454)  potassium chloride 10 mEq in 100 mL IVPB (0 mEq Intravenous Stopped 11/21/17 1735)  potassium chloride SA (K-DUR,KLOR-CON) CR tablet 40 mEq (40 mEq Oral Given 11/21/17 2018)     Initial Impression / Assessment and Plan / ED Course  I have reviewed the triage vital signs and the nursing notes.  Pertinent labs & imaging results that were available during my care of the patient were reviewed by me and considered in my medical decision making (see chart for details).     77 yo M with PMHx as above here with abdominal pain, back pain, and SOB. On arrival, pt febrile, tachycardic, and in mild distress. Concern for sepsis, likely 2/2 PNA but must also consider intra-abd pathology. Will check CT, imaging, and re-assess. CODE SEPSIS initiated, but will hold on 30 cc/kg as patient had Takutsubo's in January with poor EF, has pitting edema b/l LE, and concern for hypervolemia.   Labs, imaging as above. Pt with marked leukocytosis, mild AKI. Pt also with hypokalemia, hypomagnesemia -suspect 2/2 EtOH abuse, banana bag given. LA normal which is reassuring. CXR c/w PNA and CT scan confirms PNA, no intra-abd pathology. Pt given IV Vanc/Zosyn, fluids, and will admit to step down. Clinically is improving. HR Improved.   Sepsis re-eval - HR, VS improved. Perfusion remains normal.  Final Clinical Impressions(s) / ED Diagnoses   Final diagnoses:  Sepsis due to pneumonia [facility])  Hypokalemia  Hypomagnesemia    ED Discharge Orders    None       Duffy Bruce, MD 11/21/17 0737     Duffy Bruce, MD 11/21/17 2241

## 2017-11-21 NOTE — Progress Notes (Signed)
Pt arrived from ED. VSS, Oriented to unit and routine.  Call bell within reach.

## 2017-11-21 NOTE — Consult Note (Signed)
PULMONARY / CRITICAL CARE MEDICINE   Name: Brad Turner MRN: [id number] DOB: March 17, 1941    ADMISSION DATE:  11/21/2017 CONSULTATION DATE: 11/21/2017   CHIEF COMPLAINT: abdominal pain.  Consult for left pleural effusion  HISTORY OF PRESENT ILLNESS:        This is a 77 year old who was recently treated for Takotsubo syndrome and pneumonia who presented to the department of emergency medicine for abdominal pain.  We were asked to see the patient after a left pleural effusion was found on a CT scan of the abdomen.  The patient tells me that the abdominal pain is been insidiously progressive, it is not clearly related to eating, describes it is primarily over both lower quadrants and radiating to the back.  He has not had any associated nausea or vomiting he tells me that he is having instantaneous sudden diarrhea.  He believes the diarrhea is liquid brown has not contained any current jelly looking stool red or maroon colored stools.  He thinks he had a history of diverticulitis in the past, he is not had any fevers chills or sweats at home.  He is currently not having any significant productive cough although he feels as though he has had insidiously progressive shortness of breath.  PAST MEDICAL HISTORY :  He  has a past medical history of Alcohol withdrawal ([place]), Allergy, Arthritis, Barrett esophagus, BPH (benign prostatic hyperplasia), CAD (coronary artery disease), Chronic back pain, COPD (chronic obstructive pulmonary disease) ([place]), Depression, Emphysema lung ([place]), Fall, Gastric ulcer, Gastritis, GERD (gastroesophageal reflux disease), History of colon polyps, History of kidney stones, Hyperlipidemia, Hypertension, Hyponatremia, Joint pain, Joint swelling, Macular degeneration, Neuropathy, Nocturia, Pneumonia, PONV (postoperative nausea and vomiting), Skin cancer, Stroke ([place]) (1995), Syncope, Takotsubo cardiomyopathy, and Urgency of urination.  PAST SURGICAL HISTORY: He  has a past surgical  history that includes Appendectomy; Tonsillectomy; Rotator cuff repair; cataracts (Bilateral); Cystoscopy (07/21/2013); Lumbar laminectomy/decompression microdiscectomy (N/A, 01/12/2015); Colonoscopy; Polypectomy; Esophageal manometry (N/A, 09/20/2015); Upper gastrointestinal endoscopy; Dental surgery; Cardiac catheterization; parathyroid adenoma removed; and LEFT HEART CATH AND CORONARY ANGIOGRAPHY (N/A, 09/15/2017).  Allergies  Allergen Reactions  . Ivp Dye [Iodinated Diagnostic Agents] Hives and Shortness Of Breath    reaction was when pt was 77 years old.  . Macrodantin [Nitrofurantoin] Other (See Comments)    Fever that lasted several months    No current facility-administered medications on file prior to encounter.    Current Outpatient Medications on File Prior to Encounter  Medication Sig  . acetaminophen (TYLENOL 8 HOUR) 650 MG CR tablet Take 650 mg by mouth every 8 (eight) hours as needed for pain.  Marland Kitchen albuterol (PROVENTIL HFA;VENTOLIN HFA) 108 (90 BASE) MCG/ACT inhaler Inhale 2 puffs into the lungs every 6 (six) hours as needed for wheezing or shortness of breath. Reported on 11/29/2015  . atorvastatin (LIPITOR) 80 MG tablet Take 80 mg by mouth daily.   . clopidogrel (PLAVIX) 75 MG tablet Take 75 mg by mouth daily.  . DULoxetine (CYMBALTA) 60 MG capsule Take 60 mg by mouth daily.  . feeding supplement, ENSURE ENLIVE, (ENSURE ENLIVE) LIQD Take 237 mLs by mouth 2 (two) times daily between meals.  . fenofibrate (TRICOR) 145 MG tablet Take 145 mg by mouth daily.   . folic acid (FOLVITE) 1 MG tablet Take 1 tablet (1 mg total) by mouth daily.  . furosemide (LASIX) 40 MG tablet Take 40 mg by mouth daily.  Marland Kitchen guaiFENesin (MUCINEX) 600 MG 12 hr tablet Take 600 mg by mouth as needed for  cough or to loosen phlegm.  . loperamide (IMODIUM) 2 MG capsule Take 1-2 capsules (2-4 mg total) by mouth as needed for diarrhea or loose stools.  . metoprolol succinate (TOPROL-XL) 50 MG 24 hr tablet Take 1  tablet (50 mg total) by mouth daily. Take with or immediately following a meal.  . midodrine (PROAMATINE) 2.5 MG tablet Take 1 tablet (2.5 mg total) by mouth 3 (three) times daily with meals.  Marland Kitchen MYRBETRIQ 50 MG TB24 tablet Take 50 mg by mouth daily.   Marland Kitchen NITROSTAT 0.4 MG SL tablet Place 0.4 mg under the tongue every 5 (five) minutes as needed for chest pain. Reported on 11/29/2015  . omeprazole (PRILOSEC) 40 MG capsule TAKE ONE CAPSULE BY MOUTH TWICE DAILY  . QUEtiapine (SEROQUEL) 25 MG tablet Take 1 tablet (25 mg total) by mouth at bedtime.  . SYMBICORT 160-4.5 MCG/ACT inhaler INHALE TWO PUFFS INTO LUNGS TWICE DAILY  . thiamine 100 MG tablet Take 1 tablet (100 mg total) by mouth daily.  Marland Kitchen tiZANidine (ZANAFLEX) 4 MG capsule Take 4 mg by mouth 3 (three) times daily as needed for muscle spasms.   . vitamin B-12 (CYANOCOBALAMIN) 1000 MCG tablet Take 1 tablet (1,000 mcg total) by mouth daily.  . VOLTAREN 1 % GEL Apply 4 g topically 4 (four) times daily as needed for pain.    FAMILY HISTORY:  His indicated that the status of his mother is unknown. He indicated that the status of his father is unknown. He indicated that the status of his neg hx is unknown. He indicated that the status of his unknown relative is unknown.   SOCIAL HISTORY: He  reports that he has been smoking cigarettes.  He has a 87.00 pack-year smoking history. He has never used smokeless tobacco. He reports that he drinks about 8.4 oz of alcohol per week. He reports that he does not use drugs.  REVIEW OF SYSTEMS:   He has had a significant weight loss with anorexia since her recent spinal fusion.  He has had a CVA in 1995 and he denies any significant defects.  Before his presentation with his cardiomyopathy and pneumonia he thinks that his respiratory function was normal he is not currently having PND orthopnea and he tells me that his cardiologist called him with an echo last week which shows improved LV function.  No history of  diabetes no history of thyroid disease.  SUBJECTIVE:  As above  VITAL SIGNS: BP (!) 155/85 (BP Location: Right Arm)   Pulse 88   Temp 98.7 F (37.1 C) (Oral)   Resp 20   Ht 5\' 5"  (1.651 m)   Wt 134 lb 14.7 oz (61.2 kg)   SpO2 99%   BMI 22.45 kg/m   HEMODYNAMICS:    VENTILATOR SETTINGS:    INTAKE / OUTPUT: No intake/output data recorded.  PHYSICAL EXAMINATION: General: Patient is sitting in bed watching television and in no obvious distress. Neuro: He is oriented x3 and conversant Cardiovascular: Due to the finding of a pericardial effusion on CT he has no JVD and I do not palpate any paradox.  S1 and S2 are crisp without murmur rub or gallop. Lungs: Respirations are unlabored there is symmetric air movement anteriorly and no wheezes. Abdomen: Abdomen is flat and soft with reproducible left upper quadrant tenderness.  I cannot palpate the spleen or mass.  There is no guarding or rebound. Musculoskeletal: No edema.   LABS:  BMET Recent Labs  Lab 11/21/17 1000  NA 130*  K 3.4*  CL 92*  CO2 26  BUN 15  CREATININE 1.35*  GLUCOSE 104*    Electrolytes Recent Labs  Lab 11/21/17 1000 11/21/17 1109  CALCIUM 8.1*  --   MG  --  1.0*    CBC Recent Labs  Lab 11/21/17 1000  WBC 28.6*  HGB 10.1*  HCT 29.5*  PLT 758*    Coag's Recent Labs  Lab 11/21/17 1109  INR 1.23    Sepsis Markers Recent Labs  Lab 11/21/17 1131 11/21/17 1328  LATICACIDVEN 1.44 1.17    ABG No results for input(s): PHART, PCO2ART, PO2ART in the last 168 hours.  Liver Enzymes Recent Labs  Lab 11/21/17 1000  AST 40  ALT 26  ALKPHOS 70  BILITOT 1.0  ALBUMIN 2.5*    Cardiac Enzymes No results for input(s): TROPONINI, PROBNP in the last 168 hours.  Glucose No results for input(s): GLUCAP in the last 168 hours.  Imaging Ct Abdomen Pelvis Wo Contrast  Result Date: 11/21/2017 CLINICAL DATA:  Lower abdominal pain for several weeks.  Diarrhea. EXAM: CT ABDOMEN AND  PELVIS WITHOUT CONTRAST TECHNIQUE: Multidetector CT imaging of the abdomen and pelvis was performed following the standard protocol without IV contrast. COMPARISON:  05/01/2017 FINDINGS: Lower chest: Pericardial effusion measuring up to 16 mm in cross-section. Mild calcific atherosclerotic disease of the coronary arteries. Dense airspace consolidation in the left lower lobe, incompletely visualized with an area of decreased attenuation in the dependent portion of the lung which may represent loculated pleural effusion or area of necrosis. Hepatobiliary: Stable liver cyst. Sludge/gallstones within the dependent portion of the gallbladder. Pancreas: Unremarkable. No pancreatic ductal dilatation or surrounding inflammatory changes. Spleen: Normal in size without focal abnormality. Adrenals/Urinary Tract: Normal adrenal glands. Normal left kidney. Large but benign-appearing right renal cysts, the larger of the 2 measuring 3.5 cm. Stomach/Bowel: Stomach is within normal limits. Post appendectomy. No evidence of bowel wall thickening, distention, or inflammatory changes. Vascular/Lymphatic: Aortic atherosclerosis. No enlarged abdominal or pelvic lymph nodes., Reproductive:  Prostate is unremarkable. Other: No abdominal wall hernia or abnormality. No abdominopelvic ascites. Musculoskeletal: L4-S1 posterior fusion. IMPRESSION: Dense airspace consolidation of the left lower lobe of the lung with hypoattenuated area posteriorly which may represent loculated pleural effusion or area of necrosis. Associated left pleural effusion. Moderate in size pericardial effusion. No evidence of acute abnormalities within the abdomen or pelvis. Electronically Signed   By: Fidela Salisbury M.D.   On: 11/21/2017 16:03   Dg Chest 2 View  Result Date: 11/21/2017 CLINICAL DATA:  Abdominal pain and hypertension. Shortness of breath. EXAM: CHEST - 2 VIEW COMPARISON:  September 21, 2017 FINDINGS: There is left pleural effusion with  consolidation in portions of the inferior lingula and left lower lobe. Right lung is clear except for slight right base atelectasis. Heart is upper normal in size with pulmonary vascularity within normal limits. No adenopathy. There is aortic atherosclerosis. There is evidence of old trauma involving the lateral left clavicle. IMPRESSION: Left pleural effusion with consolidation in portions of the lingula and left lower lobe. Slight right base atelectasis. Lungs elsewhere clear. Heart upper normal in size. No evident adenopathy. There is aortic atherosclerosis. Aortic Atherosclerosis (ICD10-I70.0). Electronically Signed   By: Lowella Grip III M.D.   On: 11/21/2017 10:29   Dg Chest Port 1 View  Result Date: 11/21/2017 CLINICAL DATA:  Shortness of breath and chest pain EXAM: PORTABLE CHEST 1 VIEW COMPARISON:  November 21, 2017 FINDINGS: There is consolidation throughout the left mid  lower lung zones with left pleural effusion. Right lung is clear. Heart is mildly enlarged with pulmonary vascularity within normal limits. No adenopathy. There is aortic atherosclerosis. There are surgical clips to the left of the paratracheal region at the level of T2. There is evidence of old trauma involving the lateral left clavicle. IMPRESSION: Airspace consolidation most indicative of pneumonia throughout the left mid and lower lung zones with left pleural effusion. Right lung clear. Stable cardiomegaly. There is aortic atherosclerosis. Aortic Atherosclerosis (ICD10-I70.0). Electronically Signed   By: Lowella Grip III M.D.   On: 11/21/2017 12:04      ANTIBIOTICS: Vancomycin and Zosyn  DISCUSSION: This is a 77 year old with a recent history of Tocco Subu syndrome and pneumonia who presents with abdominal pain with an incidental finding of a left pleural effusion.  He has a substantial leukocytosis.  ASSESSMENT / PLAN:  PULMONARY A: I am aware of the presence of the pleural effusion however I think the acute  pathology lies below the diaphragm and this effusion may in fact be sympathetic.  I would prefer to the left upper quadrant tenderness with considerations including ischemia of the splenic flexure especially in a patient who is on both Midrin and Lasix, diverticuli not seen on CT and splenic emboli not seen on CT.  Should be adequately covered with vancomycin and Zosyn at the present time.  I would encourage generous hydration at this point despite his LV function in the event that marginal perfusion is in fact the issue causing his tenderness.  A thoracentesis can be arranged at leisure with interventional radiology and will be sure to send the fluid for cell count and differential, LDH, culture and sensitivity.  I have reviewed the films and history do not suspect that the collection represents a Boerhaave E syndrome or other immediately life-threatening issue.   Lars Masson, MD [facility] [place] Pager: [phone]  11/21/2017, 7:00 PM

## 2017-11-22 ENCOUNTER — Inpatient Hospital Stay (HOSPITAL_COMMUNITY): Payer: Medicare Other

## 2017-11-22 DIAGNOSIS — D509 Iron deficiency anemia, unspecified: Secondary | ICD-10-CM | POA: Diagnosis present

## 2017-11-22 DIAGNOSIS — J918 Pleural effusion in other conditions classified elsewhere: Secondary | ICD-10-CM

## 2017-11-22 DIAGNOSIS — E876 Hypokalemia: Secondary | ICD-10-CM

## 2017-11-22 DIAGNOSIS — J189 Pneumonia, unspecified organism: Secondary | ICD-10-CM

## 2017-11-22 DIAGNOSIS — A419 Sepsis, unspecified organism: Secondary | ICD-10-CM

## 2017-11-22 DIAGNOSIS — R338 Other retention of urine: Secondary | ICD-10-CM | POA: Diagnosis present

## 2017-11-22 LAB — URINE CULTURE

## 2017-11-22 LAB — GLUCOSE, PLEURAL OR PERITONEAL FLUID: Glucose, Fluid: <20 mg/dL

## 2017-11-22 LAB — BODY FLUID CELL COUNT WITH DIFFERENTIAL
Eos, Fluid: 0 %
LYMPHS FL: 3 %
Monocyte-Macrophage-Serous Fluid: 7 % — ABNORMAL LOW (ref 50–90)
Neutrophil Count, Fluid: 90 % — ABNORMAL HIGH (ref 0–25)
WBC FLUID: 2375 uL — AB (ref 0–1000)

## 2017-11-22 LAB — PROTEIN, PLEURAL OR PERITONEAL FLUID: Total protein, fluid: 3.3 g/dL

## 2017-11-22 LAB — COMPREHENSIVE METABOLIC PANEL
ALBUMIN: 1.7 g/dL — AB (ref 3.5–5.0)
ALK PHOS: 56 U/L (ref 38–126)
ALT: 15 U/L — ABNORMAL LOW (ref 17–63)
AST: 23 U/L (ref 15–41)
Anion gap: 8 (ref 5–15)
BILIRUBIN TOTAL: 0.6 mg/dL (ref 0.3–1.2)
BUN: 13 mg/dL (ref 6–20)
CALCIUM: 6.8 mg/dL — AB (ref 8.9–10.3)
CO2: 25 mmol/L (ref 22–32)
Chloride: 95 mmol/L — ABNORMAL LOW (ref 101–111)
Creatinine, Ser: 0.91 mg/dL (ref 0.61–1.24)
GFR calc Af Amer: >60 mL/min (ref >60)
GFR calc non Af Amer: >60 mL/min (ref >60)
GLUCOSE: 96 mg/dL (ref 65–99)
Potassium: 3.6 mmol/L (ref 3.5–5.1)
Sodium: 128 mmol/L — ABNORMAL LOW (ref 135–145)
Total Protein: 5.2 g/dL — ABNORMAL LOW (ref 6.5–8.1)

## 2017-11-22 LAB — GRAM STAIN

## 2017-11-22 LAB — CBC
HCT: 25.3 % — ABNORMAL LOW (ref 39.0–52.0)
HEMOGLOBIN: 8.2 g/dL — AB (ref 13.0–17.0)
MCH: 30.6 pg (ref 26.0–34.0)
MCHC: 32.4 g/dL (ref 30.0–36.0)
MCV: 94.4 fL (ref 78.0–100.0)
Platelets: 528 10*3/uL — ABNORMAL HIGH (ref 150–400)
RBC: 2.68 MIL/uL — AB (ref 4.22–5.81)
RDW: 13.4 % (ref 11.5–15.5)
WBC: 19.6 10*3/uL — ABNORMAL HIGH (ref 4.0–10.5)

## 2017-11-22 LAB — MAGNESIUM: Magnesium: 1.7 mg/dL (ref 1.7–2.4)

## 2017-11-22 LAB — LACTATE DEHYDROGENASE: LDH: 111 U/L (ref 98–192)

## 2017-11-22 LAB — GLUCOSE, CAPILLARY: Glucose-Capillary: 185 mg/dL — ABNORMAL HIGH (ref 65–99)

## 2017-11-22 LAB — RPR: RPR Ser Ql: NONREACTIVE

## 2017-11-22 LAB — LACTATE DEHYDROGENASE, PLEURAL OR PERITONEAL FLUID: LD FL: 1513 U/L — AB (ref 3–23)

## 2017-11-22 MED ORDER — ORAL CARE MOUTH RINSE
15.0000 mL | Freq: Two times a day (BID) | OROMUCOSAL | Status: DC
  Administered 2017-11-22 – 2017-11-23 (×4): 15 mL via OROMUCOSAL

## 2017-11-22 MED ORDER — BOOST / RESOURCE BREEZE PO LIQD CUSTOM
1.0000 | Freq: Three times a day (TID) | ORAL | Status: DC
  Administered 2017-11-22 – 2017-11-28 (×6): 1 via ORAL

## 2017-11-22 MED ORDER — IPRATROPIUM BROMIDE 0.02 % IN SOLN
0.5000 mg | Freq: Four times a day (QID) | RESPIRATORY_TRACT | Status: DC
  Administered 2017-11-22 (×2): 0.5 mg via RESPIRATORY_TRACT
  Filled 2017-11-22 (×2): qty 2.5

## 2017-11-22 MED ORDER — MAGNESIUM SULFATE 4 GM/100ML IV SOLN
4.0000 g | Freq: Once | INTRAVENOUS | Status: AC
Start: 2017-11-22 — End: 2017-11-22
  Administered 2017-11-22: 4 g via INTRAVENOUS
  Filled 2017-11-22: qty 100

## 2017-11-22 MED ORDER — PANTOPRAZOLE SODIUM 40 MG PO TBEC
40.0000 mg | DELAYED_RELEASE_TABLET | Freq: Every day | ORAL | Status: DC
  Administered 2017-11-22 – 2017-12-01 (×7): 40 mg via ORAL
  Filled 2017-11-22 (×9): qty 1

## 2017-11-22 MED ORDER — KETOROLAC TROMETHAMINE 15 MG/ML IJ SOLN
15.0000 mg | Freq: Four times a day (QID) | INTRAMUSCULAR | Status: DC | PRN
  Administered 2017-11-23 (×3): 15 mg via INTRAVENOUS
  Filled 2017-11-22 (×3): qty 1

## 2017-11-22 MED ORDER — MIRABEGRON ER 50 MG PO TB24
50.0000 mg | ORAL_TABLET | Freq: Every day | ORAL | Status: DC
  Administered 2017-11-22 – 2017-12-03 (×9): 50 mg via ORAL
  Filled 2017-11-22 (×12): qty 1

## 2017-11-22 MED ORDER — BUDESONIDE 0.5 MG/2ML IN SUSP
0.5000 mg | Freq: Two times a day (BID) | RESPIRATORY_TRACT | Status: DC
  Administered 2017-11-22 – 2017-12-03 (×23): 0.5 mg via RESPIRATORY_TRACT
  Filled 2017-11-22 (×22): qty 2

## 2017-11-22 MED ORDER — LEVALBUTEROL HCL 0.63 MG/3ML IN NEBU
0.6300 mg | INHALATION_SOLUTION | Freq: Two times a day (BID) | RESPIRATORY_TRACT | Status: DC
  Administered 2017-11-23 – 2017-11-24 (×3): 0.63 mg via RESPIRATORY_TRACT
  Filled 2017-11-22 (×3): qty 3

## 2017-11-22 MED ORDER — LEVALBUTEROL HCL 0.63 MG/3ML IN NEBU: 0.6300 mg | INHALATION_SOLUTION | RESPIRATORY_TRACT | Status: DC | PRN

## 2017-11-22 MED ORDER — IPRATROPIUM BROMIDE 0.02 % IN SOLN
0.5000 mg | Freq: Two times a day (BID) | RESPIRATORY_TRACT | Status: DC
  Administered 2017-11-23 – 2017-11-24 (×2): 0.5 mg via RESPIRATORY_TRACT
  Filled 2017-11-22 (×3): qty 2.5

## 2017-11-22 MED ORDER — IPRATROPIUM BROMIDE 0.02 % IN SOLN: 0.5000 mg | RESPIRATORY_TRACT | Status: DC | PRN

## 2017-11-22 MED ORDER — LEVALBUTEROL HCL 0.63 MG/3ML IN NEBU
0.6300 mg | INHALATION_SOLUTION | Freq: Four times a day (QID) | RESPIRATORY_TRACT | Status: DC
  Administered 2017-11-22 (×2): 0.63 mg via RESPIRATORY_TRACT
  Filled 2017-11-22 (×2): qty 3

## 2017-11-22 MED ORDER — LORATADINE 10 MG PO TABS
10.0000 mg | ORAL_TABLET | Freq: Every day | ORAL | Status: DC
  Administered 2017-11-22 – 2017-12-02 (×8): 10 mg via ORAL
  Filled 2017-11-22 (×8): qty 1

## 2017-11-22 MED ORDER — METHOCARBAMOL 1000 MG/10ML IJ SOLN
500.0000 mg | Freq: Three times a day (TID) | INTRAMUSCULAR | Status: DC | PRN
  Administered 2017-11-22: 500 mg via INTRAVENOUS
  Filled 2017-11-22 (×2): qty 5

## 2017-11-22 MED ORDER — POTASSIUM CHLORIDE CRYS ER 20 MEQ PO TBCR
40.0000 meq | EXTENDED_RELEASE_TABLET | Freq: Once | ORAL | Status: AC
  Administered 2017-11-22: 40 meq via ORAL
  Filled 2017-11-22: qty 2

## 2017-11-22 NOTE — Progress Notes (Signed)
Initial Nutrition Assessment  DOCUMENTATION CODES:   Non-severe (moderate) malnutrition in context of chronic illness  INTERVENTION:   -Continue Boost Breeze po TID, each supplement provides 250 kcal and 9 grams of protein -Continue MVI daily  NUTRITION DIAGNOSIS:   Moderate Malnutrition related to chronic illness(COPD) as evidenced by energy intake < or equal to 75% for > or equal to 1 month, mild fat depletion, moderate fat depletion, mild muscle depletion, moderate muscle depletion.  GOAL:   Patient will meet greater than or equal to 90% of their needs  MONITOR:   PO intake, Supplement acceptance, Diet advancement, Labs, Weight trends, Skin, I & O's  REASON FOR ASSESSMENT:   Malnutrition Screening Tool    ASSESSMENT:   Brad Turner is a 77 y.o. male with medical history significant for alcohol abuse, recent Takotsubo gammopathy with ST elevation MI including cardiac cath with stent,hypertension, COPD, tobacco use, serious CVA, since emergency Department chief complaint abdominal pain and shortness of breath. Initial evaluation reveals sepsis likely related to healthcare associated pneumonia.  Pt admitted with acute respiratory failure with hypoxia, HCAP, and PE in the setting of COPD.   Spoke with pt at bedside, who had a very flat affect. He describes a general decline in health over the past 8-9 months after undergoing a surgical procedure (pt did not elaborate as to which procedure). Pt reports he typically consumes one meal per Jamar, which consists of a piece of toast and Ensure supplement. He reports eating very little of clear liquid tray. He consumed about 1/3 of his Boost Breeze supplement.   Pt endorses a 30-40# wt loss over the past year. He reports UBW is around 155#. Per wt hx, pt has experienced a14.2% wt loss over the past year, which while not significant for time frame, is concerning when coupled with prolonged poor oral intake.   Discussed with pt importance  of good meal and supplement intake. Pt eager for diet advancement and requesting Ensure supplements. Encouraged pt to consume Boost Breeze until diet is advanced.   Medications reviewed and include MVI, thiamine, and folic acid.   Labs reviewed: Na: 128,   NUTRITION - FOCUSED PHYSICAL EXAM:    Most Recent Value  Orbital Region  Moderate depletion  Upper Arm Region  Moderate depletion  Thoracic and Lumbar Region  Mild depletion  Buccal Region  Mild depletion  Temple Region  Moderate depletion  Clavicle Bone Region  Moderate depletion  Clavicle and Acromion Bone Region  Moderate depletion  Scapular Bone Region  Moderate depletion  Dorsal Hand  Mild depletion  Patellar Region  Mild depletion  Anterior Thigh Region  Moderate depletion  Posterior Calf Region  Moderate depletion  Edema (RD Assessment)  None  Hair  Reviewed  Eyes  Reviewed  Mouth  Reviewed  Skin  Reviewed  Nails  Reviewed       Diet Order:  Diet clear liquid Room service appropriate? Yes; Fluid consistency: Thin  EDUCATION NEEDS:   Education needs have been addressed  Skin:  Skin Assessment: Reviewed RN Assessment  Last BM:  11/22/17  Height:   Ht Readings from Last 1 Encounters:  11/21/17 5\' 5"  (1.651 m)    Weight:   Wt Readings from Last 1 Encounters:  11/22/17 126 lb 15.8 oz (57.6 kg)    Ideal Body Weight:  61.8 kg  BMI:  Body mass index is 21.13 kg/m.  Estimated Nutritional Needs:   Kcal:  1800-2000  Protein:  100-115 grams  Fluid:  1.8-2.0 L    Zamira Hickam A. Jimmye Norman, RD, LDN, CDE Pager: [phone] After hours Pager: [phone]

## 2017-11-22 NOTE — Progress Notes (Addendum)
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  Patient did endorse decreased oral intake over the past 6-7 months, early satiety, 35 pound weight loss over the past year, generalized weakness, bilateral lower extremity edema, which he is on diuretics.  Patient denies any change in his chronic shortness of breath.  Patient denied any chest pain fever or chills. Patient seen in the ED known to have a temperature of 101.9 rectally, tachycardic, hypoxic with sats of 84% on room air, dehydrated with CT of the abdomen and pelvis showing dense airspace consolidation of the left lower lobe of the lung with hypoattenuation area posteriorly which may represent loculated pleural effusion no area of necrosis.  Moderate in size pericardial effusion.  No evidence of acute abnormalities within the abdomen or pelvis.   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  Urine Legionella antigen pending.  Urine pneumococcus antigen pending.  Influenza PCR negative.  Lactic acid levels trending down.  Patient on gentle hydration.  CT chest obtained 11/21/2017 with moderate left pleural effusion with associated near complete opacification of left lower lobe may be atelectasis versus pneumonia.  Dependent atelectasis in left upper lobe adjacent to pleural fluid.  Moderate emphysema.  Ultrasound-guided diagnostic and therapeutic thoracentesis pending per interventional radiology.  Patient may need bronchoscopy.  Patient has been assessed by pulmonary who feel patient's left upper quadrant tenderness may be secondary to ischemia of the splenic flexure as patient on both Midrin and Lasix.  Continue empiric IV vancomycin IV Zosyn.  Continue Mucinex.  Continue Pulmicort and scheduled neb treatments.  Continue Claritin.  PPI.  Pulmonary following and appreciate input and recommendations.  2.  Sepsis secondary to healthcare associated pneumonia/??  Loculated pleural effusion Patient met criteria for sepsis on admission with leukocytosis, fever, tachycardia, tachypnea, hypoxia, acute kidney injury.  Patient has been pancultured.  Influenza PCR negative.  Leukocytosis slowly trending down.  Patient still with fevers.  CT chest done to follow-up on left pleural effusion showing a moderate pleural effusion.  Ultrasound-guided thoracentesis for diagnostic and therapeutic per IR pending.  Continue empiric IV vancomycin IV Zosyn.  3.  Acute kidney injury Secondary to prerenal azotemia in the setting of poor  oral intake and diuretic use.  Diuretics on hold.  Nephrotoxins on hold.  Renal function  improving with gentle hydration.  Follow.  4.  Abdominal pain/severe iron deficiency anemia Patient presented with complaints of left upper quadrant abdominal pain in the setting of 35 pound weight loss over the past year, decreased oral intake over the past 6-7 months, early satiety, history of Barrett's esophagus, ongoing tobacco use.  Patient still with left-sided abdominal pain.  Concern left upper abdominal pain may be secondary to moderate pleural effusion.  Patient was seen by pulmonary who feel left upper abdominal pain may be secondary to ischemia of the splenic flexure as patient noted to be both on Midrin and Lasix.  CT abdomen and pelvis with no acute abnormalities noted.  Anemia panel consistent with iron deficiency anemia.  Patient with no overt bleeding.  Check a FOBT.  Will consult with gastroenterology for further evaluation and management.  May need a dose of IV Feraheme in the next 24-48 hours.  Placed on IV Robaxin as needed.  IV Toradol as needed.  5.  COPD/ongoing tobacco use Tobacco cessation.  Placed on Pulmicort.  Xopenex and Atrovent scheduled nebs.  Nicotine patch.  6.  Alcohol use Patient states only drinks 1-2 glasses of liquor daily.  Patient status post banana bag.  Continue the Ativan withdrawal protocol.  Continue thiamine and folic acid.  7.  Hypokalemia/hypomagnesemia Replete.  8.  Hyponatremia Likely secondary to volume depletion in the setting of diuretics.  Patient clinically dehydrated on examination.  Continue gentle hydration.  Hold diuretics.  9.  History of probable chronic hypotension Continue home dose of Midrin.  10.  ??Urinary retention versus hesitancy Resume home dose myrbetriq.  11.  Coronary artery disease/recent Takutsubo cardiomyopathy Patient denied any crushing substernal left-sided chest pain.  2D echo from [facility] 2019 with a EF of 30-35%.  Patient states had a recent 2D echo done approximately a week ago and Dr. Thurman Coyer office will  try to obtain those records.  Continue to hold diuretics.  Continue Lipitor, Plavix.  Continue to hold Toprol.  Strict I's and O's.  Daily weights.  12.  Chronic hypotension/??  Hypertension Continue Midrin.  Diuretics and beta-blocker on hold.   DVT prophylaxis: SCDs Code Status: Full Family Communication: Updated patient.  No family at bedside. Disposition Plan: Remain in stepdown unit today.   Consultants:   Pulmonary: Dr. Pearline Cables 11/21/2017    Procedures:   CT abdomen and pelvis 11/21/2017  CT chest 11/21/2017  Chest x-ray 11/21/2017  Antimicrobials:   IV vancomycin 11/21/2017  IV Zosyn 11/21/2017   Subjective: Patient complaining of diffuse abdominal pain more in the left upper quadrant which she describes as a spasm occurring intermittently as well as back spasms as well.  Patient denies any chest pain.  Patient denies any significant shortness of breath.  Patient complains of pain with coughing.  Objective: Vitals:   11/21/17 2308 11/22/17 0413 11/22/17 0500 11/22/17 0805  BP: 102/72 97/67  120/80  Pulse: 72 83  81  Resp: '12 17  15  ' Temp: (!) 97.5 F (36.4 C) 98.2 F (36.8 C)  97.7 F (36.5 C)  TempSrc: Oral Oral  Oral  SpO2: 99% 100%  100%  Weight:   57.6 kg (126 lb 15.8 oz)   Height:        Intake/Output Summary (Last 24 hours) at 11/22/2017 1039 Last data filed at 11/22/2017 0900 Gross per 24 hour  Intake 2308.75 ml  Output 325 ml  Net 1983.75 ml   Filed Weights   11/21/17 0955 11/21/17 1809 11/22/17 0500  Weight: 61.2 kg (135 lb) 61.2 kg (134 lb 14.7 oz) 57.6 kg (126 lb 15.8 oz)    Examination:  General exam: Appears calm and comfortable  Respiratory system: Decreased breath sounds in the left base.  Fair air movement.  No crackles.  No rhonchi.  Respiratory effort normal. Cardiovascular system: S1 & S2 heard, RRR. No JVD, murmurs, rubs, gallops or clicks.  1-2+ bilateral lower extremity edema.   Gastrointestinal system: Abdomen is soft,  nondistended, tender to palpation left upper quadrant, positive bowel sounds.  No hepatosplenomegaly.  Central nervous system: Alert and oriented. No focal neurological deficits. Extremities: Symmetric 5 x 5 power. Skin: No rashes, lesions or ulcers Psychiatry: Judgement and insight appear normal. Mood & affect appropriate.     Data Reviewed: I have personally reviewed following labs and imaging studies  CBC: Recent Labs  Lab 11/21/17 1000 11/22/17 0658  WBC 28.6* 19.6*  NEUTROABS 25.1*  --   HGB 10.1* 8.2*  HCT 29.5* 25.3*  MCV 93.1 94.4  PLT 758* 681*   Basic Metabolic Panel: Recent Labs  Lab 11/21/17 1000 11/21/17 1109 11/22/17 0658  NA 130*  --  128*  K 3.4*  --  3.6  CL 92*  --  95*  CO2 26  --  25  GLUCOSE 104*  --  96  BUN 15  --  13  CREATININE 1.35*  --  0.91  CALCIUM 8.1*  --  6.8*  MG  --  1.0* 1.7   GFR: Estimated Creatinine Clearance: 58 mL/min (by C-G formula based on SCr of 0.91 mg/dL). Liver Function Tests: Recent Labs  Lab 11/21/17 1000 11/22/17 0658  AST 40 23  ALT 26 15*  ALKPHOS 70 56  BILITOT 1.0 0.6  PROT 7.2 5.2*  ALBUMIN 2.5* 1.7*   Recent Labs  Lab 11/21/17 1000  LIPASE 21   No results for input(s): AMMONIA in the last 168 hours. Coagulation Profile: Recent Labs  Lab 11/21/17 1109  INR 1.23   Cardiac Enzymes: No results for input(s): CKTOTAL, CKMB, CKMBINDEX, TROPONINI in the last 168 hours. BNP (last 3 results) No results for input(s): PROBNP in the last 8760 hours. HbA1C: No results for input(s): HGBA1C in the last 72 hours. CBG: No results for input(s): GLUCAP in the last 168 hours. Lipid Profile: No results for input(s): CHOL, HDL, LDLCALC, TRIG, CHOLHDL, LDLDIRECT in the last 72 hours. Thyroid Function Tests: No results for input(s): TSH, T4TOTAL, FREET4, T3FREE, THYROIDAB in the last 72 hours. Anemia Panel: Recent Labs    11/21/17 1839  VITAMINB12 592  FOLATE 20.4  FERRITIN 386*  TIBC 171*  IRON 9*    RETICCTPCT 1.1   Sepsis Labs: Recent Labs  Lab 11/21/17 1131 11/21/17 1328 11/21/17 1839 11/21/17 2205  LATICACIDVEN 1.44 1.17 0.9 1.5    Recent Results (from the past 240 hour(s))  MRSA PCR Screening     Status: None   Collection Time: 11/21/17  7:37 PM  Result Value Ref Range Status   MRSA by PCR NEGATIVE NEGATIVE Final    Comment:        The GeneXpert MRSA Assay (FDA approved for NASAL specimens only), is one component of a comprehensive MRSA colonization surveillance program. It is not intended to diagnose MRSA infection nor to guide or monitor treatment for MRSA infections. Performed at [facility], [place] [address], [place], [place] [address]  Radiology Studies: Ct Abdomen Pelvis Wo Contrast  Result Date: 11/21/2017 CLINICAL DATA:  Lower abdominal pain for several weeks.  Diarrhea. EXAM: CT ABDOMEN AND PELVIS WITHOUT CONTRAST TECHNIQUE: Multidetector CT imaging of the abdomen and pelvis was performed following the standard protocol without IV contrast. COMPARISON:  05/01/2017 FINDINGS: Lower chest: Pericardial effusion measuring up to 16 mm in cross-section. Mild calcific atherosclerotic disease of the coronary arteries. Dense airspace consolidation in the left lower lobe, incompletely visualized with an area of decreased attenuation in the dependent portion of the lung which may represent loculated pleural effusion or area of necrosis. Hepatobiliary: Stable liver cyst. Sludge/gallstones within the dependent portion of the gallbladder. Pancreas: Unremarkable. No pancreatic ductal dilatation or surrounding inflammatory changes. Spleen: Normal in size without focal abnormality. Adrenals/Urinary Tract: Normal adrenal glands. Normal left kidney. Large but benign-appearing right renal cysts, the larger of the 2 measuring 3.5 cm. Stomach/Bowel: Stomach is within normal limits. Post appendectomy. No evidence of bowel wall thickening, distention, or inflammatory  changes. Vascular/Lymphatic: Aortic atherosclerosis. No enlarged abdominal or pelvic lymph nodes., Reproductive:  Prostate is unremarkable. Other: No abdominal wall hernia or abnormality. No abdominopelvic ascites. Musculoskeletal: L4-S1 posterior fusion. IMPRESSION: Dense airspace consolidation of the left lower lobe of the lung with hypoattenuated area posteriorly which may represent loculated pleural effusion or area of necrosis. Associated left pleural effusion. Moderate in size pericardial effusion. No evidence of acute abnormalities within the abdomen or pelvis. Electronically Signed   By: Fidela Salisbury M.D.   On: 11/21/2017 16:03   Dg Chest 2 View  Result Date: 11/21/2017 CLINICAL DATA:  Abdominal pain and hypertension. Shortness of breath. EXAM: CHEST - 2 VIEW COMPARISON:  September 21, 2017 FINDINGS: There is left pleural effusion with consolidation in portions of the inferior lingula and left lower lobe. Right lung is clear except for slight right base atelectasis. Heart is upper normal in size with pulmonary vascularity within normal limits. No adenopathy. There is aortic atherosclerosis. There is evidence of old trauma involving the lateral left clavicle. IMPRESSION: Left pleural effusion with consolidation in portions of the lingula and left lower lobe. Slight right base atelectasis. Lungs elsewhere clear. Heart upper normal in size. No evident adenopathy. There is aortic atherosclerosis. Aortic Atherosclerosis (ICD10-I70.0). Electronically Signed   By: Lowella Grip III M.D.   On: 11/21/2017 10:29   Ct Chest Wo Contrast  Result Date: 11/22/2017 CLINICAL DATA:  Left pleural effusion. EXAM: CT CHEST WITHOUT CONTRAST TECHNIQUE: Multidetector CT imaging of the chest was performed following the standard protocol without IV contrast. COMPARISON:  Chest radiograph and abdominal CT earlier this [place]. Chest CT 08/07/2017 FINDINGS: Cardiovascular: Aortic atherosclerosis without aneurysm. Small  pericardial effusion that is not significantly changed from abdominal CT. Coronary artery calcifications. Mediastinum/Nodes: Lack of IV contrast limits assessment for hilar adenopathy. Suspect prominent left hilar nodes that are obscured by adjacent lung opacity. Small mediastinal nodes are not enlarged by size criteria. No esophageal thickening. Surgical clip adjacent to the posterior left thyroid lobe. Lungs/Pleura: Left pleural effusion, moderate in size that measures simple fluid density. There is near complete opacification of the left lower lobe. Dependent densities in the upper lobe adjacent to pleural fluid likely atelectasis. Moderate emphysema. No evidence of endobronchial lesion. No pulmonary edema. Mild dependent atelectasis in the right lung base. Scattered calcified granuloma. Upper Abdomen: Evaluated on abdominal CT earlier this [place]. Enteric contrast within the colon. Gallstones. Hepatic and right renal cysts. Musculoskeletal: There are no acute or suspicious osseous abnormalities. IMPRESSION: 1.  Moderate left pleural effusion measuring simple fluid density. Associated near complete opacification of the left lower lobe may be atelectasis or pneumonia. Dependent atelectasis in the left upper lobe adjacent to pleural fluid. 2. Moderate emphysema. 3. Aortic atherosclerosis and coronary artery calcifications. Small pericardial effusion as seen on abdominal CT. Aortic Atherosclerosis (ICD10-I70.0) and Emphysema (ICD10-J43.9). Electronically Signed   By: Jeb Levering M.D.   On: 11/22/2017 02:11   Dg Chest Port 1 View  Result Date: 11/21/2017 CLINICAL DATA:  Shortness of breath and chest pain EXAM: PORTABLE CHEST 1 VIEW COMPARISON:  November 21, 2017 FINDINGS: There is consolidation throughout the left mid lower lung zones with left pleural effusion. Right lung is clear. Heart is mildly enlarged with pulmonary vascularity within normal limits. No adenopathy. There is aortic atherosclerosis. There are  surgical clips to the left of the paratracheal region at the level of T2. There is evidence of old trauma involving the lateral left clavicle. IMPRESSION: Airspace consolidation most indicative of pneumonia throughout the left mid and lower lung zones with left pleural effusion. Right lung clear. Stable cardiomegaly. There is aortic atherosclerosis. Aortic Atherosclerosis (ICD10-I70.0). Electronically Signed   By: Lowella Grip III M.D.   On: 11/21/2017 12:04        Scheduled Meds: . atorvastatin  80 mg Oral Daily  . budesonide (PULMICORT) nebulizer solution  0.5 mg Nebulization BID  . clopidogrel  75 mg Oral Daily  . DULoxetine  60 mg Oral Daily  . feeding supplement  1 Container Oral TID BM  . folic acid  1 mg Oral Daily  . guaiFENesin  1,200 mg Oral BID  . ipratropium  0.5 mg Nebulization Q6H  . levalbuterol  0.63 mg Nebulization Q6H  . loratadine  10 mg Oral Daily  . mouth rinse  15 mL Mouth Rinse BID  . midodrine  2.5 mg Oral TID WC  . multivitamin with minerals  1 tablet Oral Daily  . nicotine  21 mg Transdermal Daily  . pantoprazole  40 mg Oral Q0600  . potassium chloride  40 mEq Oral Once  . QUEtiapine  25 mg Oral QHS  . thiamine  100 mg Oral Daily   Or  . thiamine  100 mg Intravenous Daily  . tiZANidine  4 mg Oral TID   Continuous Infusions: . sodium chloride 75 mL/hr at 11/21/17 1957  . magnesium sulfate 1 - 4 g bolus IVPB    . methocarbamol (ROBAXIN)  IV    . piperacillin-tazobactam (ZOSYN)  IV 3.375 g (11/22/17 1024)  . vancomycin Stopped (11/22/17 0056)     LOS: 1 [place]    Time spent: 40 minutes    Irine Seal, MD [facility] Pager [phone]  If 7PM-7AM, please contact night-coverage [email or web address] Password [facility] 11/22/2017, 10:39 AM

## 2017-11-23 ENCOUNTER — Inpatient Hospital Stay (HOSPITAL_COMMUNITY): Payer: Medicare Other

## 2017-11-23 DIAGNOSIS — J869 Pyothorax without fistula: Secondary | ICD-10-CM

## 2017-11-23 DIAGNOSIS — J189 Pneumonia, unspecified organism: Secondary | ICD-10-CM

## 2017-11-23 DIAGNOSIS — D638 Anemia in other chronic diseases classified elsewhere: Secondary | ICD-10-CM

## 2017-11-23 DIAGNOSIS — K210 Gastro-esophageal reflux disease with esophagitis: Secondary | ICD-10-CM

## 2017-11-23 DIAGNOSIS — R1012 Left upper quadrant pain: Secondary | ICD-10-CM

## 2017-11-23 DIAGNOSIS — J918 Pleural effusion in other conditions classified elsewhere: Secondary | ICD-10-CM

## 2017-11-23 LAB — CBC WITH DIFFERENTIAL/PLATELET
BASOS ABS: 0 10*3/uL (ref 0.0–0.1)
Basophils Relative: 0 %
Eosinophils Absolute: 0.1 10*3/uL (ref 0.0–0.7)
Eosinophils Relative: 1 %
HEMATOCRIT: 26.5 % — AB (ref 39.0–52.0)
Hemoglobin: 8.8 g/dL — ABNORMAL LOW (ref 13.0–17.0)
LYMPHS ABS: 1.7 10*3/uL (ref 0.7–4.0)
LYMPHS PCT: 10 %
MCH: 31.3 pg (ref 26.0–34.0)
MCHC: 33.2 g/dL (ref 30.0–36.0)
MCV: 94.3 fL (ref 78.0–100.0)
Monocytes Absolute: 1.2 10*3/uL — ABNORMAL HIGH (ref 0.1–1.0)
Monocytes Relative: 7 %
NEUTROS ABS: 14.3 10*3/uL — AB (ref 1.7–7.7)
Neutrophils Relative %: 82 %
Platelets: 565 10*3/uL — ABNORMAL HIGH (ref 150–400)
RBC: 2.81 MIL/uL — AB (ref 4.22–5.81)
RDW: 13.4 % (ref 11.5–15.5)
WBC: 17.4 10*3/uL — AB (ref 4.0–10.5)

## 2017-11-23 LAB — SURGICAL PCR SCREEN
MRSA, PCR: NEGATIVE
STAPHYLOCOCCUS AUREUS: NEGATIVE

## 2017-11-23 LAB — COMPREHENSIVE METABOLIC PANEL
ALK PHOS: 56 U/L (ref 38–126)
ALT: 14 U/L — AB (ref 17–63)
AST: 27 U/L (ref 15–41)
Albumin: 1.6 g/dL — ABNORMAL LOW (ref 3.5–5.0)
Anion gap: 9 (ref 5–15)
BILIRUBIN TOTAL: 0.8 mg/dL (ref 0.3–1.2)
BUN: 8 mg/dL (ref 6–20)
CALCIUM: 7.1 mg/dL — AB (ref 8.9–10.3)
CO2: 21 mmol/L — ABNORMAL LOW (ref 22–32)
CREATININE: 0.88 mg/dL (ref 0.61–1.24)
Chloride: 100 mmol/L — ABNORMAL LOW (ref 101–111)
GFR calc Af Amer: >60 mL/min (ref >60)
Glucose, Bld: 121 mg/dL — ABNORMAL HIGH (ref 65–99)
Potassium: 3.9 mmol/L (ref 3.5–5.1)
Sodium: 130 mmol/L — ABNORMAL LOW (ref 135–145)
TOTAL PROTEIN: 5.2 g/dL — AB (ref 6.5–8.1)

## 2017-11-23 LAB — BLOOD GAS, ARTERIAL
Acid-base deficit: 1.2 mmol/L (ref 0.0–2.0)
Bicarbonate: 22.6 mmol/L (ref 20.0–28.0)
Drawn by: 52075
FIO2: 21
O2 Saturation: 78.3 %
Patient temperature: 98.6
pCO2 arterial: 35.2 mmHg (ref 32.0–48.0)
pH, Arterial: 7.424 (ref 7.350–7.450)
pO2, Arterial: 46.1 mmHg — ABNORMAL LOW (ref 83.0–108.0)

## 2017-11-23 LAB — PROTIME-INR
INR: 1.17
PROTHROMBIN TIME: 14.8 s (ref 11.4–15.2)

## 2017-11-23 LAB — MAGNESIUM: MAGNESIUM: 2.1 mg/dL (ref 1.7–2.4)

## 2017-11-23 LAB — APTT: aPTT: 44 seconds — ABNORMAL HIGH (ref 24–36)

## 2017-11-23 LAB — VANCOMYCIN, TROUGH: Vancomycin Tr: 10 ug/mL — ABNORMAL LOW (ref 15–20)

## 2017-11-23 MED ORDER — THIAMINE HCL 100 MG/ML IJ SOLN: 100.0000 mg | Freq: Every day | INTRAMUSCULAR | Status: DC

## 2017-11-23 MED ORDER — ADULT MULTIVITAMIN W/MINERALS CH: 1.0000 | ORAL_TABLET | Freq: Every day | ORAL | Status: DC

## 2017-11-23 MED ORDER — VANCOMYCIN HCL IN DEXTROSE 750-5 MG/150ML-% IV SOLN
750.0000 mg | Freq: Two times a day (BID) | INTRAVENOUS | Status: AC
  Administered 2017-11-23 – 2017-11-26 (×7): 750 mg via INTRAVENOUS
  Filled 2017-11-23 (×7): qty 150

## 2017-11-23 MED ORDER — METOPROLOL SUCCINATE ER 50 MG PO TB24
50.0000 mg | ORAL_TABLET | Freq: Every day | ORAL | Status: DC
  Administered 2017-11-23: 50 mg via ORAL
  Filled 2017-11-23: qty 1

## 2017-11-23 MED ORDER — SODIUM CHLORIDE 0.9 % IV SOLN
INTRAVENOUS | Status: DC
  Administered 2017-11-23 – 2017-11-24 (×2): 75 mL via INTRAVENOUS

## 2017-11-23 MED ORDER — METHOCARBAMOL 500 MG PO TABS: 500.0000 mg | ORAL_TABLET | Freq: Three times a day (TID) | ORAL | Status: DC | PRN

## 2017-11-23 MED ORDER — VITAMIN B-1 100 MG PO TABS
100.0000 mg | ORAL_TABLET | Freq: Every day | ORAL | Status: DC
Start: 2017-11-23 — End: 2017-11-24
  Administered 2017-11-23: 100 mg via ORAL
  Filled 2017-11-23: qty 1

## 2017-11-23 MED ORDER — FOLIC ACID 1 MG PO TABS: 1.0000 mg | ORAL_TABLET | Freq: Every day | ORAL | Status: DC

## 2017-11-23 MED ORDER — SORBITOL 70 % SOLN: 30.0000 mL | Freq: Every day | Status: DC | PRN

## 2017-11-23 MED ORDER — SODIUM CHLORIDE 0.9 % IV SOLN
510.0000 mg | Freq: Once | INTRAVENOUS | Status: AC
  Administered 2017-11-23: 510 mg via INTRAVENOUS
  Filled 2017-11-23: qty 17

## 2017-11-23 MED ORDER — LORAZEPAM 2 MG/ML IJ SOLN: 0.0000 mg | Freq: Two times a day (BID) | INTRAMUSCULAR | Status: DC

## 2017-11-23 MED ORDER — LORAZEPAM 1 MG PO TABS: 1.0000 mg | ORAL_TABLET | Freq: Four times a day (QID) | ORAL | Status: DC | PRN

## 2017-11-23 MED ORDER — LORAZEPAM 2 MG/ML IJ SOLN
1.0000 mg | Freq: Four times a day (QID) | INTRAMUSCULAR | Status: DC | PRN
  Filled 2017-11-23: qty 1

## 2017-11-23 MED ORDER — LORAZEPAM 2 MG/ML IJ SOLN
0.0000 mg | Freq: Four times a day (QID) | INTRAMUSCULAR | Status: DC
  Administered 2017-11-24: 1 mg via INTRAVENOUS

## 2017-11-23 NOTE — Progress Notes (Signed)
Pharmacy Antibiotic Note  Brad Turner is a 77 y.o. male admitted on 11/21/2017 with sepsis and concern for empyema. Pharmacy has been consulted for vancomycin and Zosyn dosing. Vancomycin trough today subtherapeutic at 10 mcg/ml. All cultures negative thus far, Cr stable.  Plan: -Increase vancomycin to 750mg  IV q12h -Continue Zosyn 3.375g IV EI q8h -Obtain repeat VT at [facility] -Follow cultures, renal funx, LOT  Height: 5\' 5"  (165.1 cm) Weight: 132 lb 4.4 oz (60 kg) IBW/kg (Calculated) : 61.5  Temp (24hrs), Avg:98.2 F (36.8 C), Min:97.6 F (36.4 C), Max:99.6 F (37.6 C)  Recent Labs  Lab 11/21/17 1000 11/21/17 1131 11/21/17 1328 11/21/17 1839 11/21/17 2205 11/22/17 0658 11/23/17 0226 11/23/17 1057  WBC 28.6*  --   --   --   --  19.6* 17.4*  --   CREATININE 1.35*  --   --   --   --  0.91 0.88  --   LATICACIDVEN  --  1.44 1.17 0.9 1.5  --   --   --   VANCOTROUGH  --   --   --   --   --   --   --  10*    Estimated Creatinine Clearance: 62.5 mL/min (by C-G formula based on SCr of 0.88 mg/dL).    Allergies  Allergen Reactions  . Ivp Dye [Iodinated Diagnostic Agents] Hives and Shortness Of Breath    reaction was when pt was 77 years old.  Clancy Gourd [Nitrofurantoin] Other (See Comments)    Fever that lasted several months    Antimicrobials this admission: 3/22 Vancomycin >> 3/22 Zosyn >>  Dose adjustments this admission: 3/24 VT = 10 (drawn ~30 min early)  Microbiology results: 3/22 BCx: NGTD 3/22 MRSA: neg 3/22 UCx: neg 3/22 Legionella: pending 3/22 S. Pneumo: neg   Thank you for allowing pharmacy to be a part of this patient's care.  Arrie Senate, PharmD, BCPS PGY-2 Cardiology Pharmacy Resident Pager: [phone] 11/23/2017

## 2017-11-23 NOTE — Progress Notes (Addendum)
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  Patient did endorse decreased oral intake over the past 6-7 months, early satiety, 35 pound weight loss over the past year, generalized weakness, bilateral lower extremity edema, which he is on diuretics.  Patient denies any change in his chronic shortness of breath.  Patient denied any chest pain fever or chills. Patient seen in the ED known to have a temperature of 101.9 rectally, tachycardic, hypoxic with sats of 84% on room air, dehydrated with CT of the abdomen and pelvis showing dense airspace consolidation of the left lower lobe of the lung with hypoattenuation area posteriorly which may represent loculated pleural effusion no area of necrosis.  Moderate in size pericardial effusion.  No evidence of acute abnormalities within the abdomen or pelvis.   Assessment & Plan:   Principal Problem:   Acute respiratory failure (HCC) Active Problems:   Sepsis (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   Tobacco abuse   ETOH abuse   HCAP (healthcare-associated pneumonia)   Pleural effusion   Acute kidney injury ([place])   Abdominal pain   Iron deficiency anemia   Acute urinary retention   Hypokalemia   Hypomagnesemia   Pleural  effusion on left   Sepsis due to pneumonia (HCC)   Empyema, left (HCC)  #1 acute hypoxic respiratory failure secondary to healthcare associated pneumonia and moderate side left pleural effusion/questionable loculated pleural effusion/??post obstructive PNA in the setting of COPD and ongoing tobacco abuse Patient now on room air, still on nasal cannula with some clinical improvement however complaining more of left upper quadrant pain worse with coughing.  Urine Legionella antigen pending.  Urine pneumococcus antigen negative.  Influenza PCR negative.  Lactic acid levels trending down.  Patient on gentle hydration.  CT chest obtained 11/21/2017 with moderate left pleural effusion with associated near complete opacification of left lower lobe may be atelectasis versus pneumonia.  Dependent atelectasis in left upper lobe adjacent to pleural fluid.  Moderate emphysema.  Ultrasound-guided diagnostic and therapeutic thoracentesis was done on 11/22/2017, per interventional radiology.  Patient may need bronchoscopy.  Patient has been assessed by pulmonary who feel patient's left upper quadrant tenderness may be secondary to ischemia of the splenic flexure as patient on both Midrin and Lasix.  Thoracentesis consistent with exudative process with body fluids concerning for empyema.  Cultures pending.  Continue empiric IV vancomycin IV Zosyn, Mucinex, Pulmicort and scheduled neb treatments, Claritin, PPI.  Pulmonary following and appreciate input and recommendations.  2.  Sepsis secondary to healthcare associated pneumonia/exudative pleural effusion concerning for empyema. Patient met criteria for sepsis on admission with leukocytosis, fever, tachycardia, tachypnea, hypoxia, acute kidney injury.  Patient has been pancultured.  Influenza PCR negative.  Leukocytosis slowly trending down.  Fever curve trending down.  CT  chest done to follow-up on left pleural effusion showing a moderate pleural effusion.  Patient status  post ultrasound-guided thoracentesis for diagnostic and therapeutic per IR 11/22/2017 with 480 cc of hazy yellow fluid removed.  Fluid concerning for empyema as glucose levels are less than 20, by light's criteria and exudate, 90% neutrophils noted, LDH of 1513.  Cultures from thoracentesis pending. Continue empiric IV vancomycin IV Zosyn.  Pulmonary and critical care medicine following.  3.  Acute kidney injury Secondary to prerenal azotemia in the setting of poor oral intake and diuretic use.  Diuretics on hold.  Nephrotoxins on hold.  Renal function improving with gentle hydration.  Follow.  4.  Abdominal pain/severe iron deficiency anemia Patient presented with complaints of left upper quadrant abdominal pain in the setting of 35 pound weight loss over the past year, decreased oral intake over the past 6-7 months, early satiety, history of Barrett's esophagus, ongoing tobacco use.  Patient still with left-sided abdominal pain.  Concern left upper abdominal pain may be secondary to moderate pleural effusion.  Patient was seen by pulmonary who feel left upper abdominal pain may be secondary to ischemia of the splenic flexure as patient noted to be both on Midrin and Lasix.  CT abdomen and pelvis with no acute abnormalities noted.  Anemia panel consistent with iron deficiency anemia.  Patient with no overt bleeding.  FOBT pending.  Gastroenterology has been consulted.  We will give a dose of IV Feraheme x1.  Continue Robaxin and Toradol as needed for pain.    5.  COPD/ongoing tobacco use Tobacco cessation.  Continue Pulmicort,  Xopenex and Atrovent scheduled nebs.  Nicotine patch.  6.  Alcohol use Patient with no signs of alcohol withdrawal.  Patient states only drinks 1-2 glasses of liquor daily.  Patient status post banana bag.  Continue the Ativan withdrawal protocol.  Continue thiamine and folic acid.  7.  Hypokalemia/hypomagnesemia Repleted.  8.  Hyponatremia Likely secondary to volume  depletion in the setting of diuretics.  Patient clinically dehydrated on examination.  Slowly improving with hydration.  Continue gentle hydration.  Continue to hold diuretics.   9.  History of probable chronic hypotension Continue home dose of Midrin.  10.  ??Urinary retention versus hesitancy Continue home dose myrbetriq.  11.  Coronary artery disease/recent Takutsubo cardiomyopathy Patient denied any crushing substernal left-sided chest pain.  2D echo from [facility] 2019 with a EF of 30-35%.  Patient states had a recent 2D echo done approximately a week ago and Dr. Thurman Coyer office will try to obtain those records.  Continue to hold diuretics.  Continue Lipitor, Plavix.  Resumed Toprol. Strict I's and O's.  Daily weights.  12.  Chronic hypotension/??  Hypertension Continue Midrin.  Diuretics and beta-blocker on hold.  Follow.   DVT prophylaxis: SCDs Code Status: Full Family Communication: Updated patient.  No family at bedside. Disposition Plan: Remain in stepdown unit today.   Consultants:   Pulmonary: Dr. Pearline Cables 11/21/2017    Procedures:   CT abdomen and pelvis 11/21/2017  CT chest 11/21/2017  Chest x-ray 11/21/2017  Ultrasound-guided thoracentesis with 480 cc of hazy yellow fluid removed per IR 11/22/2017  Antimicrobials:   IV vancomycin 11/21/2017  IV Zosyn 11/21/2017   Subjective: Patient somewhat drowsy.  States Toradol helping with his abdominal pain medication and says that he was able to sleep last night.  Patient status post thoracentesis with not that much significant left upper quadrant pain improvement.  Denies any significant chest pain.  Objective: Vitals:   11/23/17 0526 11/23/17 0600 11/23/17 0734 11/23/17 0738  BP:   (!) 163/92   Pulse:  79 88 86  Resp:   (!) 24 (!) 21  Temp:   97.8 F (36.6 C)   TempSrc:   Oral   SpO2:   97% 96%  Weight: 60 kg (132 lb 4.4 oz)     Height:        Intake/Output Summary (Last 24 hours) at 11/23/2017 1020 Last data  filed at 11/23/2017 0900 Gross per 24 hour  Intake 2675 ml  Output 420 ml  Net 2255 ml   Filed Weights   11/21/17 1809 11/22/17 0500 11/23/17 0526  Weight: 61.2 kg (134 lb 14.7 oz) 57.6 kg (126 lb 15.8 oz) 60 kg (132 lb 4.4 oz)    Examination:  General exam: Somewhat drowsy Respiratory system: Decreased breath sounds in the left base.  Fair air movement.  No crackles.  No rhonchi.  Respiratory effort normal. Cardiovascular system: Regular rate rhythm no murmurs rubs or gallops.  No JVD.  Trace to 1+ bilateral lower extremity edema.  Gastrointestinal system: Abdomen is soft, nondistended, some decreased tenderness to palpation left upper quadrant, positive bowel sounds.  No rebound.  No guarding.  Central nervous system: Alert and oriented. No focal neurological deficits. Extremities: Symmetric 5 x 5 power. Skin: No rashes, lesions or ulcers Psychiatry: Judgement and insight appear normal. Mood & affect appropriate.     Data Reviewed: I have personally reviewed following labs and imaging studies  CBC: Recent Labs  Lab 11/21/17 1000 11/22/17 0658 11/23/17 0226  WBC 28.6* 19.6* 17.4*  NEUTROABS 25.1*  --  14.3*  HGB 10.1* 8.2* 8.8*  HCT 29.5* 25.3* 26.5*  MCV 93.1 94.4 94.3  PLT 758* 528* 657*   Basic Metabolic Panel: Recent Labs  Lab 11/21/17 1000 11/21/17 1109 11/22/17 0658 11/23/17 0226  NA 130*  --  128* 130*  K 3.4*  --  3.6 3.9  CL 92*  --  95* 100*  CO2 26  --  25 21*  GLUCOSE 104*  --  96 121*  BUN 15  --  13 8  CREATININE 1.35*  --  0.91 0.88  CALCIUM 8.1*  --  6.8* 7.1*  MG  --  1.0* 1.7 2.1   GFR: Estimated Creatinine Clearance: 62.5 mL/min (by C-G formula based on SCr of 0.88 mg/dL). Liver Function Tests: Recent Labs  Lab 11/21/17 1000 11/22/17 0658 11/23/17 0226  AST 40 23 27  ALT 26 15* 14*  ALKPHOS 70 56 56  BILITOT 1.0 0.6 0.8  PROT 7.2 5.2* 5.2*  ALBUMIN 2.5* 1.7* 1.6*   Recent Labs  Lab 11/21/17 1000  LIPASE 21   No results for  input(s): AMMONIA in the last 168 hours. Coagulation Profile: Recent Labs  Lab 11/21/17 1109  INR 1.23   Cardiac Enzymes: No results for input(s): CKTOTAL, CKMB, CKMBINDEX, TROPONINI in the last 168 hours. BNP (last 3 results) No results for input(s): PROBNP in the last 8760 hours. HbA1C: No results for input(s): HGBA1C in the last 72 hours. CBG: Recent Labs  Lab 11/22/17 1230  GLUCAP 185*   Lipid Profile: No results for input(s): CHOL, HDL, LDLCALC, TRIG, CHOLHDL, LDLDIRECT in the last 72 hours. Thyroid Function Tests: No results for input(s): TSH, T4TOTAL, FREET4, T3FREE, THYROIDAB in the last 72 hours. Anemia Panel: Recent Labs    11/21/17 1839  VITAMINB12 592  FOLATE 20.4  FERRITIN 386*  TIBC 171*  IRON 9*  RETICCTPCT 1.1   Sepsis Labs: Recent Labs  Lab 11/21/17 1131 11/21/17 1328 11/21/17 1839 11/21/17 2205  LATICACIDVEN 1.44 1.17 0.9 1.5    Recent Results (from the past 240 hour(s))  Blood Culture (routine x 2)     Status: None (Preliminary result)   Collection Time: 11/21/17 11:08 AM  Result Value Ref Range Status   Specimen Description BLOOD BLOOD RIGHT FOREARM  Final   Special Requests   Final    BOTTLES DRAWN AEROBIC AND ANAEROBIC Blood Culture adequate volume   Culture   Final    NO GROWTH 1 [place] Performed at [facility], [place] [address], [place], [place] [address]    Report Status PENDING  Incomplete  Blood Culture (routine x 2)     Status: None (Preliminary result)   Collection Time: 11/21/17 11:13 AM  Result Value Ref Range Status   Specimen Description BLOOD LEFT ANTECUBITAL  Final   Special Requests   Final    BOTTLES DRAWN AEROBIC AND ANAEROBIC Blood Culture results may not be optimal due to an inadequate volume of blood received in culture bottles   Culture   Final    NO GROWTH 1 [place] Performed at [facility], [place] [address] [place], [place] [address]    Report Status PENDING  Incomplete  Urine culture     Status:  Abnormal   Collection Time: 11/21/17 12:15 PM  Result Value Ref Range Status   Specimen Description URINE, CLEAN CATCH  Final   Special Requests NONE  Final   Culture (A)  Final    <10,000 COLONIES/mL INSIGNIFICANT GROWTH Performed at [facility], [place] [address] [place], [place] [address]    Report Status 11/22/2017 FINAL  Final  MRSA PCR Screening     Status: None   Collection Time: 11/21/17  7:37 PM  Result Value Ref Range Status   MRSA by PCR NEGATIVE NEGATIVE Final    Comment:        The GeneXpert MRSA Assay (FDA approved for NASAL specimens only), is one component of a comprehensive MRSA colonization surveillance program. It is not intended to diagnose MRSA infection nor to guide or monitor treatment for MRSA infections. Performed at [facility], [place] [address], [place], [place] [address]   Gram stain     Status: None   Collection Time: 11/22/17  2:27 PM  Result Value Ref Range Status   Specimen Description PLEURAL LEFT  Final   Special Requests NONE  Final   Gram Stain   Final    RARE WBC PRESENT, PREDOMINANTLY PMN NO ORGANISMS SEEN Performed at [facility], [place] [address], [place], [place] [address]    Report Status 11/22/2017 FINAL  Final         Radiology Studies: Ct Abdomen Pelvis Wo Contrast  Result Date: 11/21/2017 CLINICAL DATA:  Lower abdominal pain for several weeks.  Diarrhea. EXAM: CT ABDOMEN AND PELVIS WITHOUT CONTRAST TECHNIQUE: Multidetector CT imaging of the abdomen and pelvis was performed following the standard protocol without IV contrast. COMPARISON:  05/01/2017 FINDINGS: Lower chest: Pericardial effusion measuring up to 16 mm in cross-section. Mild calcific atherosclerotic disease of the coronary arteries. Dense airspace consolidation in the left lower lobe, incompletely visualized with an area of decreased attenuation in the dependent portion of the lung which may represent loculated pleural effusion or area of necrosis.  Hepatobiliary: Stable liver cyst. Sludge/gallstones within the dependent portion of the gallbladder.  Pancreas: Unremarkable. No pancreatic ductal dilatation or surrounding inflammatory changes. Spleen: Normal in size without focal abnormality. Adrenals/Urinary Tract: Normal adrenal glands. Normal left kidney. Large but benign-appearing right renal cysts, the larger of the 2 measuring 3.5 cm. Stomach/Bowel: Stomach is within normal limits. Post appendectomy. No evidence of bowel wall thickening, distention, or inflammatory changes. Vascular/Lymphatic: Aortic atherosclerosis. No enlarged abdominal or pelvic lymph nodes., Reproductive:  Prostate is unremarkable. Other: No abdominal wall hernia or abnormality. No abdominopelvic ascites. Musculoskeletal: L4-S1 posterior fusion. IMPRESSION: Dense airspace consolidation of the left lower lobe of the lung with hypoattenuated area posteriorly which may represent loculated pleural effusion or area of necrosis. Associated left pleural effusion. Moderate in size pericardial effusion. No evidence of acute abnormalities within the abdomen or pelvis. Electronically Signed   By: Fidela Salisbury M.D.   On: 11/21/2017 16:03   Dg Chest 1 View  Result Date: 11/22/2017 CLINICAL DATA:  Thoracentesis EXAM: CHEST  1 VIEW COMPARISON:  11/21/2017 FINDINGS: There is no pneumothorax post left thoracentesis. Airspace disease throughout the left lung is stable. Opacity at the left base is stable. Right basilar airspace disease increased. Cardiomegaly. IMPRESSION: No pneumothorax post left thoracentesis. Electronically Signed   By: Marybelle Killings M.D.   On: 11/22/2017 14:56   Dg Chest 2 View  Result Date: 11/21/2017 CLINICAL DATA:  Abdominal pain and hypertension. Shortness of breath. EXAM: CHEST - 2 VIEW COMPARISON:  September 21, 2017 FINDINGS: There is left pleural effusion with consolidation in portions of the inferior lingula and left lower lobe. Right lung is clear except for slight  right base atelectasis. Heart is upper normal in size with pulmonary vascularity within normal limits. No adenopathy. There is aortic atherosclerosis. There is evidence of old trauma involving the lateral left clavicle. IMPRESSION: Left pleural effusion with consolidation in portions of the lingula and left lower lobe. Slight right base atelectasis. Lungs elsewhere clear. Heart upper normal in size. No evident adenopathy. There is aortic atherosclerosis. Aortic Atherosclerosis (ICD10-I70.0). Electronically Signed   By: Lowella Grip III M.D.   On: 11/21/2017 10:29   Ct Chest Wo Contrast  Result Date: 11/22/2017 CLINICAL DATA:  Left pleural effusion. EXAM: CT CHEST WITHOUT CONTRAST TECHNIQUE: Multidetector CT imaging of the chest was performed following the standard protocol without IV contrast. COMPARISON:  Chest radiograph and abdominal CT earlier this [place]. Chest CT 08/07/2017 FINDINGS: Cardiovascular: Aortic atherosclerosis without aneurysm. Small pericardial effusion that is not significantly changed from abdominal CT. Coronary artery calcifications. Mediastinum/Nodes: Lack of IV contrast limits assessment for hilar adenopathy. Suspect prominent left hilar nodes that are obscured by adjacent lung opacity. Small mediastinal nodes are not enlarged by size criteria. No esophageal thickening. Surgical clip adjacent to the posterior left thyroid lobe. Lungs/Pleura: Left pleural effusion, moderate in size that measures simple fluid density. There is near complete opacification of the left lower lobe. Dependent densities in the upper lobe adjacent to pleural fluid likely atelectasis. Moderate emphysema. No evidence of endobronchial lesion. No pulmonary edema. Mild dependent atelectasis in the right lung base. Scattered calcified granuloma. Upper Abdomen: Evaluated on abdominal CT earlier this [place]. Enteric contrast within the colon. Gallstones. Hepatic and right renal cysts. Musculoskeletal: There are no acute or  suspicious osseous abnormalities. IMPRESSION: 1. Moderate left pleural effusion measuring simple fluid density. Associated near complete opacification of the left lower lobe may be atelectasis or pneumonia. Dependent atelectasis in the left upper lobe adjacent to pleural fluid. 2. Moderate emphysema. 3. Aortic atherosclerosis and coronary artery calcifications. Small  pericardial effusion as seen on abdominal CT. Aortic Atherosclerosis (ICD10-I70.0) and Emphysema (ICD10-J43.9). Electronically Signed   By: Jeb Levering M.D.   On: 11/22/2017 02:11   Dg Chest Port 1 View  Result Date: 11/21/2017 CLINICAL DATA:  Shortness of breath and chest pain EXAM: PORTABLE CHEST 1 VIEW COMPARISON:  November 21, 2017 FINDINGS: There is consolidation throughout the left mid lower lung zones with left pleural effusion. Right lung is clear. Heart is mildly enlarged with pulmonary vascularity within normal limits. No adenopathy. There is aortic atherosclerosis. There are surgical clips to the left of the paratracheal region at the level of T2. There is evidence of old trauma involving the lateral left clavicle. IMPRESSION: Airspace consolidation most indicative of pneumonia throughout the left mid and lower lung zones with left pleural effusion. Right lung clear. Stable cardiomegaly. There is aortic atherosclerosis. Aortic Atherosclerosis (ICD10-I70.0). Electronically Signed   By: Lowella Grip III M.D.   On: 11/21/2017 12:04   US Thoracentesis Asp Pleural Space W/img Guide  Result Date: 11/22/2017 INDICATION: Acute respiratory failure with hypoxia secondary to healthcare associated pneumonia and loculated pleural effusion. Request for diagnostic and therapeutic thoracentesis. EXAM: ULTRASOUND GUIDED LEFT THORACENTESIS MEDICATIONS: 1% Lidocaine = 12 mL COMPLICATIONS: None immediate. PROCEDURE: An ultrasound guided thoracentesis was thoroughly discussed with the patient and questions answered. The benefits, risks,  alternatives and complications were also discussed. The patient understands and wishes to proceed with the procedure. Written consent was obtained. Ultrasound was performed to localize and mark an adequate pocket of fluid in the left chest. The area was then prepped and draped in the normal sterile fashion. 1% Lidocaine was used for local anesthesia. Under ultrasound guidance a 6 Fr Safe-T-Centesis catheter was introduced. Thoracentesis was performed. The catheter was removed and a dressing applied. FINDINGS: A total of approximately 480 mL of hazy yellow fluid was removed. Samples were sent to the laboratory as requested by the clinical team. IMPRESSION: Successful ultrasound guided left thoracentesis yielding 480 mL of pleural fluid. No pneumothorax on post procedure chest X-ray. Read by: Gareth Eagle, PA-C Electronically Signed   By: Markus Daft M.D.   On: 11/22/2017 15:01        Scheduled Meds: . atorvastatin  80 mg Oral Daily  . budesonide (PULMICORT) nebulizer solution  0.5 mg Nebulization BID  . clopidogrel  75 mg Oral Daily  . DULoxetine  60 mg Oral Daily  . feeding supplement  1 Container Oral TID BM  . folic acid  1 mg Oral Daily  . guaiFENesin  1,200 mg Oral BID  . ipratropium  0.5 mg Nebulization BID  . levalbuterol  0.63 mg Nebulization BID  . loratadine  10 mg Oral Daily  . mouth rinse  15 mL Mouth Rinse BID  . midodrine  2.5 mg Oral TID WC  . mirabegron ER  50 mg Oral Daily  . multivitamin with minerals  1 tablet Oral Daily  . nicotine  21 mg Transdermal Daily  . pantoprazole  40 mg Oral Q0600  . QUEtiapine  25 mg Oral QHS  . thiamine  100 mg Oral Daily   Or  . thiamine  100 mg Intravenous Daily   Continuous Infusions: . sodium chloride 75 mL (11/22/17 2244)  . methocarbamol (ROBAXIN)  IV Stopped (11/22/17 1155)  . piperacillin-tazobactam (ZOSYN)  IV 3.375 g (11/23/17 0847)  . vancomycin Stopped (11/23/17 0100)     LOS: 2 days    Time spent: 40  minutes    Quillian Quince  Grandville Silos, MD [facility] Pager [phone]  If 7PM-7AM, please contact night-coverage [email or web address] Password [email or web address] 11/23/2017, 10:20 AM

## 2017-11-23 NOTE — Consult Note (Signed)
[address]       [place] [address]             [phone]          CARDIOTHORACIC SURGERY CONSULTATION REPORT  PCP is [name], [name] Him, MD Referring Provider is Eugenie Filler, MD  Reason for consultation:  Left lower lobe pneumonia with possible empyema  HPI:  Patient is a 77 year old alcoholic male with multiple chronic medical problems admitted to the hospital with left lower lobe pneumonia who has been referred for surgical consultation for management of possible empyema.  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  He states that over the past 6 weeks he has developed a persistent dry nonproductive cough.  He began to experience left sided abdominal discomfort and left chest wall discomfort which progressed, ultimately prompting him to present to the emergency department with chief complaint of abdominal pain.  The patient denied fevers or chills although was febrile in the emergency department to 101.9 F.  He was noted to be tachycardic and had leukocytosis with white blood count 28,600 at the time of admission.  He was hypoxic on admission although the patient denied worsening of his chronic shortness of breath.  Abdominal CT scan was notable for dense consolidation of the left lower lobe with likely pneumonia and moderate-large left parapneumonic effusion, which was confirmed with formal chest CT scan.  The patient was started on intravenous vancomycin and Zosyn for possible HCAP.  Pulmonary medicine and GI medicine consultation was requested.  The pulmonary medicine team felt that the patient's primary pathology was intra-abdominal, but ultrasound-guided thoracentesis was  performed yesterday yielding 450 mL of hazy yellow fluid associated with neutrophils and low glucose level.  Cardiothoracic surgical consultation was requested.  Patient is single and lives locally in [place].  He has 1 adult son who lives nearby.  He is alert and oriented but has demonstrated periods of confusion during his hospitalization.  His recollection of his past medical history is incomplete.  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  He has a history of alcohol abuse with formal hospitalizations notable for alcohol withdrawal.  He lives a fairly sedentary lifestyle.  He has had problems with chronic pain and reports decreased appetite and weight loss over the last year or more.  He has a long-standing history of tobacco abuse and continues to smoke 1/2 pack cigarettes daily.  He admits that he has clinically been in poor health for a long time.   Past Medical History:  Diagnosis Date  . Alcohol withdrawal ([place])   . Allergy    seasonal  . Arthritis   . Barrett esophagus   . BPH (benign prostatic hyperplasia)   . CAD (coronary artery disease)    a. s/p multiple POBA last in 2011 to distal OM2. b. Takotsubo event 09/2017 with cath showing no culprit, 25% prox LAD, 60% OM1, 65% OM2, EF 35-40% by cath and 30-35% by echo.  . Chronic back pain   . COPD (chronic obstructive pulmonary disease) ([facility])    Albuterol inhaler as needed.Uses Symbicort daily  . Depression   . Emphysema lung ([place])   . Fall    fell on 10/06/16 with loss of consiousness for  a short time.States he remembers getting up but remembers nothing else.  . Gastric ulcer   . Gastritis   . GERD (gastroesophageal reflux disease)    takes Omeprazole daily  . History of colon polyps    benign  . History of kidney stones   . Hyperlipidemia   . Hypertension   . Hyponatremia   . Joint pain   . Joint swelling   . 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 Neuropathy   . Nocturia   . Pneumonia    hx of  . PONV (postoperative nausea and vomiting)   . Skin cancer   . Stroke [facility]) 1995   Affected balance; and has very emotional if watching a movie  . Syncope    yr ago  . Takotsubo cardiomyopathy    a. 09/2017 - EF 35-40% by cath, 30-35% by echo.  . Urgency of urination    takes Myrbetriq daily    Past Surgical History:  Procedure Laterality Date  . APPENDECTOMY    . CARDIAC CATHETERIZATION     x 4. Most recent one in 2011 at [facility](3 previous were done 20 yrs before)  . cataracts Bilateral   . COLONOSCOPY    . CYSTOSCOPY  07/21/2013   Dr. Jeffie Pollock  . DENTAL SURGERY     with sedation  . ESOPHAGEAL MANOMETRY N/A 09/20/2015   Procedure: ESOPHAGEAL MANOMETRY (EM);  Surgeon: Manus Gunning, MD;  Location: [facility];  Service: Gastroenterology;  Laterality: N/A;  . LEFT HEART CATH AND CORONARY ANGIOGRAPHY N/A 09/15/2017   Procedure: LEFT HEART CATH AND CORONARY ANGIOGRAPHY;  Surgeon: Martinique, Peter M, MD;  Location: [facility];  Service: Cardiovascular;  Laterality: N/A;  . LUMBAR LAMINECTOMY/DECOMPRESSION MICRODISCECTOMY N/A 01/12/2015   Procedure: LUMBAR LAMINECTOMY/DECOMPRESSION MICRODISCECTOMY 3 LEVELS;  Surgeon: Phylliss Bob, MD;  Location: [facility];  Service: Orthopedics;  Laterality: N/A;  Lumbar 3-4, lumbar 4-5, lumbar 5-sacrum 1 decompression  . parathyroid adenoma removed    . POLYPECTOMY    . ROTATOR CUFF REPAIR     bilateral  . TONSILLECTOMY    . UPPER GASTROINTESTINAL ENDOSCOPY      Family History  Problem Relation Age of Onset  . Stroke Mother   . Rheum arthritis Mother   . Heart disease Father   . Colon cancer Unknown        ? mat. uncle died age 26  . Colon polyps Neg Hx   . Esophageal cancer Neg Hx   . Rectal cancer Neg Hx   . Stomach cancer Neg Hx     Social History   Socioeconomic History  . Marital status: Single    Spouse name: Not on file  . Number of  children: 1  . Years of education: Not on file  . Highest education level: Not on file  Occupational History  . Occupation: Network engineer OF [employer]    Employer: [employer]  Social Needs  . Financial resource strain: Not on file  . Food insecurity:    Worry: Not on file    Inability: Not on file  . Transportation needs:    Medical: Not on file    Non-medical: Not on file  Tobacco Use  . Smoking status: Current Every Kube Smoker    Packs/Judon: 1.50    Years: 58.00    Pack years: 87.00    Types: Cigarettes  . Smokeless tobacco: Never Used  Substance and  Sexual Activity  . Alcohol use: Yes    Alcohol/week: 8.4 oz    Types: 14 Standard drinks or equivalent per week    Comment: every Singley, vodka- 1-2 a Campise   . Drug use: No  . Sexual activity: Not on file  Lifestyle  . Physical activity:    Days per week: Not on file    Minutes per session: Not on file  . Stress: Not on file  Relationships  . Social connections:    Talks on phone: Not on file    Gets together: Not on file    Attends religious service: Not on file    Active member of club or organization: Not on file    Attends meetings of clubs or organizations: Not on file    Relationship status: Not on file  . Intimate partner violence:    Fear of current or ex partner: Not on file    Emotionally abused: Not on file    Physically abused: Not on file    Forced sexual activity: Not on file  Other Topics Concern  . Not on file  Social History Narrative  . Not on file    Prior to Admission medications   Medication Sig Start Date End Date Taking? Authorizing Provider  acetaminophen (TYLENOL 8 HOUR) 650 MG CR tablet Take 650 mg by mouth every 8 (eight) hours as needed for pain.   Yes [provider]  albuterol (PROVENTIL HFA;VENTOLIN HFA) 108 (90 BASE) MCG/ACT inhaler Inhale 2 puffs into the lungs every 6 (six) hours as needed for wheezing or shortness of breath. Reported on 11/29/2015   Yes [provider]    atorvastatin (LIPITOR) 80 MG tablet Take 80 mg by mouth daily.    Yes [provider]  clopidogrel (PLAVIX) 75 MG tablet Take 75 mg by mouth daily.   Yes [provider]  DULoxetine (CYMBALTA) 60 MG capsule Take 60 mg by mouth daily.   Yes [provider]  feeding supplement, ENSURE ENLIVE, (ENSURE ENLIVE) LIQD Take 237 mLs by mouth 2 (two) times daily between meals. 09/25/17  Yes Oswald Hillock, MD  fenofibrate (TRICOR) 145 MG tablet Take 145 mg by mouth daily.    Yes [provider]  folic acid (FOLVITE) 1 MG tablet Take 1 tablet (1 mg total) by mouth daily. 09/19/17  Yes Sheikh, Omair Latif, DO  furosemide (LASIX) 40 MG tablet Take 40 mg by mouth daily. 11/13/17  Yes [provider]  guaiFENesin (MUCINEX) 600 MG 12 hr tablet Take 600 mg by mouth as needed for cough or to loosen phlegm.   Yes [provider]  loperamide (IMODIUM) 2 MG capsule Take 1-2 capsules (2-4 mg total) by mouth as needed for diarrhea or loose stools. 09/18/17  Yes Sheikh, Omair Latif, DO  metoprolol succinate (TOPROL-XL) 50 MG 24 hr tablet Take 1 tablet (50 mg total) by mouth daily. Take with or immediately following a meal. 09/19/17  Yes Duke, Tami Lin, PA  midodrine (PROAMATINE) 2.5 MG tablet Take 1 tablet (2.5 mg total) by mouth 3 (three) times daily with meals. 09/25/17  Yes Lama, Marge Duncans, MD  MYRBETRIQ 50 MG TB24 tablet Take 50 mg by mouth daily.  05/03/15  Yes [provider]  NITROSTAT 0.4 MG SL tablet Place 0.4 mg under the tongue every 5 (five) minutes as needed for chest pain. Reported on 11/29/2015 05/29/13  Yes [provider]  omeprazole (PRILOSEC) 40 MG capsule TAKE ONE CAPSULE BY  MOUTH TWICE DAILY 04/22/16  Yes Armbruster, Carlota Raspberry, MD  QUEtiapine (SEROQUEL) 25 MG tablet Take 1 tablet (25 mg total) by mouth at bedtime. 09/25/17  Yes Oswald Hillock, MD  SYMBICORT 160-4.5 MCG/ACT inhaler INHALE TWO PUFFS INTO LUNGS TWICE DAILY 04/19/14  Yes Clance,  Armando Reichert, MD  thiamine 100 MG tablet Take 1 tablet (100 mg total) by mouth daily. 09/19/17  Yes Sheikh, Omair Latif, DO  tiZANidine (ZANAFLEX) 4 MG capsule Take 4 mg by mouth 3 (three) times daily as needed for muscle spasms.    Yes [provider]  vitamin B-12 (CYANOCOBALAMIN) 1000 MCG tablet Take 1 tablet (1,000 mcg total) by mouth daily. 09/25/17  Yes Lama, Marge Duncans, MD  VOLTAREN 1 % GEL Apply 4 g topically 4 (four) times daily as needed for pain. 09/11/17   [provider]    Current Facility-Administered Medications  Medication Dose Route Frequency Provider Last Rate Last Dose  . 0.9 %  sodium chloride infusion   Intravenous Continuous Eugenie Filler, MD 75 mL/hr at 11/22/17 2244 75 mL at 11/22/17 2244  . acetaminophen (TYLENOL) tablet 650 mg  650 mg Oral Q6H PRN Radene Gunning, NP   650 mg at 11/22/17 1517   Or  . acetaminophen (TYLENOL) suppository 650 mg  650 mg Rectal Q6H PRN Radene Gunning, NP      . atorvastatin (LIPITOR) tablet 80 mg  80 mg Oral Daily Radene Gunning, NP   80 mg at 11/23/17 0849  . budesonide (PULMICORT) nebulizer solution 0.5 mg  0.5 mg Nebulization BID Eugenie Filler, MD   0.5 mg at 11/23/17 0737  . clopidogrel (PLAVIX) tablet 75 mg  75 mg Oral Daily Radene Gunning, NP   75 mg at 11/23/17 0849  . DULoxetine (CYMBALTA) DR capsule 60 mg  60 mg Oral Daily Radene Gunning, NP   60 mg at 11/23/17 6160  . feeding supplement (BOOST / RESOURCE BREEZE) liquid 1 Container  1 Container Oral TID BM Eugenie Filler, MD   1 Container at 11/23/17 [phone]  . ferumoxytol (FERAHEME) 510 mg in sodium chloride 0.9 % 100 mL IVPB  510 mg Intravenous Once Eugenie Filler, MD      . folic acid (FOLVITE) tablet 1 mg  1 mg Oral Daily Radene Gunning, NP   1 mg at 11/23/17 0850  . guaiFENesin (MUCINEX) 12 hr tablet 1,200 mg  1,200 mg Oral BID Dyanne Carrel M, NP   1,200 mg at 11/23/17 0849  . HYDROcodone-homatropine (HYCODAN) 5-1.5 MG/5ML syrup 5 mL  5 mL Oral Q4H PRN  Eugenie Filler, MD   5 mL at 11/23/17 0848  . ipratropium (ATROVENT) nebulizer solution 0.5 mg  0.5 mg Nebulization Q2H PRN Eugenie Filler, MD      . ipratropium (ATROVENT) nebulizer solution 0.5 mg  0.5 mg Nebulization BID Eugenie Filler, MD   0.5 mg at 11/23/17 0626  . ketorolac (TORADOL) 15 MG/ML injection 15 mg  15 mg Intravenous Q6H PRN Eugenie Filler, MD   15 mg at 11/23/17 0848  . levalbuterol (XOPENEX) nebulizer solution 0.63 mg  0.63 mg Nebulization Q2H PRN Eugenie Filler, MD      . levalbuterol Penne Lash) nebulizer solution 0.63 mg  0.63 mg Nebulization BID Eugenie Filler, MD   0.63 mg at 11/23/17 9485  . loperamide (IMODIUM) capsule 2-4 mg  2-4 mg Oral PRN Black, Lezlie Octave, NP      .  loratadine (CLARITIN) tablet 10 mg  10 mg Oral Daily Eugenie Filler, MD   10 mg at 11/23/17 0851  . LORazepam (ATIVAN) tablet 1 mg  1 mg Oral Q6H PRN Radene Gunning, NP       Or  . LORazepam (ATIVAN) injection 1 mg  1 mg Intravenous Q6H PRN Radene Gunning, NP      . MEDLINE mouth rinse  15 mL Mouth Rinse BID Eugenie Filler, MD   15 mL at 11/23/17 0857  . methocarbamol (ROBAXIN) tablet 500 mg  500 mg Oral Q8H PRN Eugenie Filler, MD      . metoprolol succinate (TOPROL-XL) 24 hr tablet 50 mg  50 mg Oral Q breakfast Eugenie Filler, MD      . midodrine (PROAMATINE) tablet 2.5 mg  2.5 mg Oral TID WC Radene Gunning, NP   2.5 mg at 11/22/17 1742  . mirabegron ER (MYRBETRIQ) tablet 50 mg  50 mg Oral Daily Eugenie Filler, MD   50 mg at 11/23/17 4268  . multivitamin with minerals tablet 1 tablet  1 tablet Oral Daily Radene Gunning, NP   1 tablet at 11/23/17 0850  . nicotine (NICODERM CQ - dosed in mg/24 hours) patch 21 mg  21 mg Transdermal Daily Radene Gunning, NP   21 mg at 11/23/17 0853  . ondansetron (ZOFRAN) tablet 4 mg  4 mg Oral Q6H PRN Radene Gunning, NP   4 mg at 11/23/17 0849   Or  . ondansetron (ZOFRAN) injection 4 mg  4 mg Intravenous Q6H PRN Radene Gunning, NP       . pantoprazole (PROTONIX) EC tablet 40 mg  40 mg Oral Q0600 Eugenie Filler, MD   40 mg at 11/23/17 0850  . piperacillin-tazobactam (ZOSYN) IVPB 3.375 g  3.375 g Intravenous Q8H Radene Gunning, NP 12.5 mL/hr at 11/23/17 0847 3.375 g at 11/23/17 0847  . QUEtiapine (SEROQUEL) tablet 25 mg  25 mg Oral QHS Radene Gunning, NP   25 mg at 11/22/17 2126  . sorbitol 70 % solution 30 mL  30 mL Oral Daily PRN Eugenie Filler, MD      . thiamine (VITAMIN B-1) tablet 100 mg  100 mg Oral Daily Radene Gunning, NP   100 mg at 11/23/17 3419   Or  . thiamine (B-1) injection 100 mg  100 mg Intravenous Daily Black, Lezlie Octave, NP      . vancomycin (VANCOCIN) 500 mg in sodium chloride 0.9 % 100 mL IVPB  500 mg Intravenous Q12H Radene Gunning, NP   Stopped at 11/23/17 0100    Allergies  Allergen Reactions  . Ivp Dye [Iodinated Diagnostic Agents] Hives and Shortness Of Breath    reaction was when pt was 77 years old.  Clancy Gourd [Nitrofurantoin] Other (See Comments)    Fever that lasted several months      Review of Systems:   General:  poor appetite, decreased energy, no weight gain, + weight loss, + fever  Cardiac:  + chest pain with exertion, + chest pain at rest, +SOB with exertion, + resting SOB, no PND, no orthopnea, no palpitations, no arrhythmia, no atrial fibrillation, no LE edema, no dizzy spells, no syncope  Respiratory:  + shortness of breath, no home oxygen, no productive cough, + dry cough, no bronchitis, no wheezing, no hemoptysis, no asthma, + pain with inspiration or cough, no sleep apnea, no CPAP at night  GI:  no difficulty swallowing, + reflux, + frequent heartburn, no hiatal hernia, + abdominal pain, no constipation, no diarrhea, no hematochezia, no hematemesis, no melena  GU:   no dysuria,  no frequency, no urinary tract infection, no hematuria, no enlarged prostate, no kidney stones, + kidney disease  Vascular:  no pain suggestive of claudication, no pain in feet, + leg cramps, no  varicose veins, no DVT, no non-healing foot ulcer  Neuro:   + stroke, no TIA's, no seizures, no headaches, no temporary blindness one eye,  no slurred speech, + peripheral neuropathy, + chronic pain, + instability of gait, + memory/cognitive dysfunction  Musculoskeletal: + arthritis - primarily involving the back, no joint swelling, no myalgias, + difficulty walking, diminished mobility   Skin:   no rash, no itching, no skin infections, no pressure sores or ulcerations  Psych:   no anxiety, no depression, no nervousness, no unusual recent stress  Eyes:   no blurry vision, no floaters, no recent vision changes, + wears glasses or contacts  ENT:   no hearing loss, no loose or painful teeth  Hematologic:  no easy bruising, no abnormal bleeding, no clotting disorder, no frequent epistaxis  Endocrine:  no diabetes, does not check CBG's at home     Physical Exam:   BP (!) 163/92 (BP Location: Right Arm)   Pulse 86   Temp 97.8 F (36.6 C) (Oral)   Resp (!) 21   Ht 5\' 5"  (1.651 m)   Wt 132 lb 4.4 oz (60 kg)   SpO2 96%   BMI 22.01 kg/m   General:  Thin, malnourished, frail-appearing  HEENT:  Unremarkable   Neck:   no JVD, no bruits, no adenopathy   Chest:   Diminished breath sounds on left with egophony   CV:   RRR, no murmur   Abdomen:  soft, non-tender, no masses   Extremities:  warm, well-perfused, pulses diminished, no lower extremity edema  Rectal/GU  Deferred  Neuro:   Grossly non-focal and symmetrical throughout  Skin:   Clean and dry, no rashes, no breakdown  Diagnostic Tests:  Lab Results: Recent Labs    11/22/17 0658 11/23/17 0226  WBC 19.6* 17.4*  HGB 8.2* 8.8*  HCT 25.3* 26.5*  PLT 528* 565*   BMET:  Recent Labs    11/22/17 0658 11/23/17 0226  NA 128* 130*  K 3.6 3.9  CL 95* 100*  CO2 25 21*  GLUCOSE 96 121*  BUN 13 8  CREATININE 0.91 0.88  CALCIUM 6.8* 7.1*     CHEST - 2 VIEW  COMPARISON:  September 21, 2017  FINDINGS: There is left pleural  effusion with consolidation in portions of the inferior lingula and left lower lobe. Right lung is clear except for slight right base atelectasis. Heart is upper normal in size with pulmonary vascularity within normal limits. No adenopathy. There is aortic atherosclerosis. There is evidence of old trauma involving the lateral left clavicle.  IMPRESSION: Left pleural effusion with consolidation in portions of the lingula and left lower lobe. Slight right base atelectasis. Lungs elsewhere clear. Heart upper normal in size. No evident adenopathy. There is aortic atherosclerosis.  Aortic Atherosclerosis (ICD10-I70.0).   Electronically Signed   By: Lowella Grip III M.D.   On: 11/21/2017 10:29    CT ABDOMEN AND PELVIS WITHOUT CONTRAST  TECHNIQUE: Multidetector CT imaging of the abdomen and pelvis was performed following the standard protocol without IV contrast.  COMPARISON:  05/01/2017  FINDINGS: Lower chest: Pericardial effusion measuring  up to 16 mm in cross-section. Mild calcific atherosclerotic disease of the coronary arteries. Dense airspace consolidation in the left lower lobe, incompletely visualized with an area of decreased attenuation in the dependent portion of the lung which may represent loculated pleural effusion or area of necrosis.  Hepatobiliary: Stable liver cyst. Sludge/gallstones within the dependent portion of the gallbladder.  Pancreas: Unremarkable. No pancreatic ductal dilatation or surrounding inflammatory changes.  Spleen: Normal in size without focal abnormality.  Adrenals/Urinary Tract: Normal adrenal glands. Normal left kidney. Large but benign-appearing right renal cysts, the larger of the 2 measuring 3.5 cm.  Stomach/Bowel: Stomach is within normal limits. Post appendectomy. No evidence of bowel wall thickening, distention, or inflammatory changes.  Vascular/Lymphatic: Aortic atherosclerosis. No enlarged abdominal  or pelvic lymph nodes.,  Reproductive:  Prostate is unremarkable.  Other: No abdominal wall hernia or abnormality. No abdominopelvic ascites.  Musculoskeletal: L4-S1 posterior fusion.  IMPRESSION: Dense airspace consolidation of the left lower lobe of the lung with hypoattenuated area posteriorly which may represent loculated pleural effusion or area of necrosis. Associated left pleural effusion.  Moderate in size pericardial effusion.  No evidence of acute abnormalities within the abdomen or pelvis.   Electronically Signed   By: Fidela Salisbury M.D.   On: 11/21/2017 16:03    CT CHEST WITHOUT CONTRAST  TECHNIQUE: Multidetector CT imaging of the chest was performed following the standard protocol without IV contrast.  COMPARISON:  Chest radiograph and abdominal CT earlier this [place]. Chest CT 08/07/2017  FINDINGS: Cardiovascular: Aortic atherosclerosis without aneurysm. Small pericardial effusion that is not significantly changed from abdominal CT. Coronary artery calcifications.  Mediastinum/Nodes: Lack of IV contrast limits assessment for hilar adenopathy. Suspect prominent left hilar nodes that are obscured by adjacent lung opacity. Small mediastinal nodes are not enlarged by size criteria. No esophageal thickening. Surgical clip adjacent to the posterior left thyroid lobe.  Lungs/Pleura: Left pleural effusion, moderate in size that measures simple fluid density. There is near complete opacification of the left lower lobe. Dependent densities in the upper lobe adjacent to pleural fluid likely atelectasis. Moderate emphysema. No evidence of endobronchial lesion. No pulmonary edema. Mild dependent atelectasis in the right lung base. Scattered calcified granuloma.  Upper Abdomen: Evaluated on abdominal CT earlier this [place]. Enteric contrast within the colon. Gallstones. Hepatic and right renal cysts.  Musculoskeletal: There are no acute or  suspicious osseous abnormalities.  IMPRESSION: 1. Moderate left pleural effusion measuring simple fluid density. Associated near complete opacification of the left lower lobe may be atelectasis or pneumonia. Dependent atelectasis in the left upper lobe adjacent to pleural fluid. 2. Moderate emphysema. 3. Aortic atherosclerosis and coronary artery calcifications. Small pericardial effusion as seen on abdominal CT.  Aortic Atherosclerosis (ICD10-I70.0) and Emphysema (ICD10-J43.9).   Electronically Signed   By: Jeb Levering M.D.   On: 11/22/2017 02:11  CHEST  1 VIEW  COMPARISON:  11/21/2017  FINDINGS: There is no pneumothorax post left thoracentesis. Airspace disease throughout the left lung is stable. Opacity at the left base is stable. Right basilar airspace disease increased. Cardiomegaly.  IMPRESSION: No pneumothorax post left thoracentesis.   Electronically Signed   By: Marybelle Killings M.D.   On: 11/22/2017 14:56    Impression:  Patient has healthcare associated pneumonia with dense consolidation of the entire left lower lobe and moderate-sized parapneumonic effusion, possible empyema.  I personally reviewed the patient's history, laboratory data, and radiographic studies performed during this hospitalization.  Clinically the patient seems to be gradually improving with  intravenous antibiotics and portable chest x-ray performed yesterday after thoracentesis revealed some improvement in the opacification of the left hemithorax.  The patient will definitely need to undergo bronchoscopy at some point to rule out the possibility of endobronchial mass lesion.  I suspect that he will likely need VATS or possible thoracotomy for definitive management of his empyema.   Plan:  I have discussed the nature of the patient's current acute illness at length with the patient at the bedside.  Although the patient is alert and oriented he seems a bit confused by his complex  list of medical problems and at this point he remains reluctant to consider surgical intervention for more definitive drainage of his likely empyema.  We will obtain a follow-up upright PA and lateral chest x-ray today.  Unless there has been substantial improvement I would recommend proceeding with surgical intervention in the near future.  In the meanwhile I agree with management as outlined previously by Dr. Grandville Silos.  We will hold Plavix in anticipation of possible need for surgical intervention.  The patient should be carefully monitored for signs of alcohol withdrawal.   I spent in excess of 60 minutes during the conduct of this hospital consultation and >50% of this time involved direct face-to-face encounter for counseling and/or coordination of the patient's care.    Valentina Gu. Roxy Manns, MD 11/23/2017 11:03 AM

## 2017-11-23 NOTE — Consult Note (Addendum)
Referring Provider:  Dr. Grandville Silos, [facility] Primary Care Physician:  Osborne Casco [name] Him, MD Primary Gastroenterologist:  Dr. 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  Patient did endorse decreased oral intake over the past year since his back surgery because of generally not feeling well, which has resulted in a 35 pound weight loss.  Also reports early satiety.  Upon evaluation here CT abdomen and pelvis without contrast on 3/22 showed the following:  IMPRESSION: Dense airspace consolidation of the left lower lobe of the lung with hypoattenuated area posteriorly which may represent loculated pleural effusion or area of necrosis. Associated left pleural effusion.  Moderate in size pericardial effusion.  No evidence of acute abnormalities within the abdomen or pelvis.  Hgb is low at 8.8 grams as compared to 10-11 grams a couple of months ago.  MCV is normal.  Ferritin is elevated at 386, iron low at 9, saturation low at 5%, and TIBC is also low at 171.  Patient denies seeing any blood in his stools.  Maybe occasion dark stool.  Is on omeprazole twice daily at home for his UGI issues.   EGD 11/2015 by Dr. Havery Moros showed the following:    - Esophagogastric landmarks identified. - 1 cm hiatal hernia. - Z-line irregular, 38 cm from the incisors. - Irregular mucosa in the esophagus - benign. - Erythematous mucosa in the gastric body. Biopsied. - Interval healing of gastric ulcer and gastritis otherwise - Normal duodenal bulb and second  portion of the duodenum.  Pathology revealed mild chronic gastritis, negative for Hpylori.  Colonoscopy 07/2010 with diverticulosis and a 3 mm polyp that was removed, hyperplastic polyp pathology.   Past Medical History:  Diagnosis Date  . Alcohol withdrawal ([place])   . Allergy    seasonal  . Arthritis   . Barrett esophagus   . BPH (benign prostatic hyperplasia)   . CAD (coronary artery disease)    a. s/p multiple POBA last in 2011 to distal OM2. b. Takotsubo event 09/2017 with cath showing no culprit, 25% prox LAD, 60% OM1, 65% OM2, EF 35-40% by cath and 30-35% by echo.  . Chronic back pain   . COPD (chronic obstructive pulmonary disease) (H[facility])    Albuterol inhaler as needed.Uses Symbicort daily  . Depression   . Emphysema lung ([place])   . Fall    fell on 10/06/16 with loss of consiousness for a short time.States he remembers getting up but remembers nothing else.  . Gastric ulcer   . Gastritis   . GERD (gastroesophageal reflux disease)    takes Omeprazole daily  . History of colon polyps    benign  . History of kidney stones   . Hyperlipidemia   . Hypertension   . Hyponatremia   . Joint pain   . Joint swelling   . 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 Neuropathy   . Nocturia   . Pneumonia    hx of  . PONV (postoperative nausea and vomiting)   . Skin  cancer   . Stroke [facility]) 1995   Affected balance; and has very emotional if watching a movie  . Syncope    yr ago  . Takotsubo cardiomyopathy    a. 09/2017 - EF 35-40% by cath, 30-35% by echo.  . Urgency of urination    takes Myrbetriq daily    Past Surgical History:  Procedure Laterality Date  . APPENDECTOMY    . CARDIAC CATHETERIZATION     x 4. Most recent one in 2011 at [facility](3 previous were done 20 yrs before)  . cataracts Bilateral   . COLONOSCOPY    . CYSTOSCOPY  07/21/2013   Dr. Jeffie Pollock  . DENTAL SURGERY     with sedation  . ESOPHAGEAL MANOMETRY N/A 09/20/2015    Procedure: ESOPHAGEAL MANOMETRY (EM);  Surgeon: Manus Gunning, MD;  Location: [facility];  Service: Gastroenterology;  Laterality: N/A;  . LEFT HEART CATH AND CORONARY ANGIOGRAPHY N/A 09/15/2017   Procedure: LEFT HEART CATH AND CORONARY ANGIOGRAPHY;  Surgeon: Martinique, Peter M, MD;  Location: [facility];  Service: Cardiovascular;  Laterality: N/A;  . LUMBAR LAMINECTOMY/DECOMPRESSION MICRODISCECTOMY N/A 01/12/2015   Procedure: LUMBAR LAMINECTOMY/DECOMPRESSION MICRODISCECTOMY 3 LEVELS;  Surgeon: Phylliss Bob, MD;  Location: [facility];  Service: Orthopedics;  Laterality: N/A;  Lumbar 3-4, lumbar 4-5, lumbar 5-sacrum 1 decompression  . parathyroid adenoma removed    . POLYPECTOMY    . ROTATOR CUFF REPAIR     bilateral  . TONSILLECTOMY    . UPPER GASTROINTESTINAL ENDOSCOPY      Prior to Admission medications   Medication Sig Start Date End Date Taking? Authorizing Provider  acetaminophen (TYLENOL 8 HOUR) 650 MG CR tablet Take 650 mg by mouth every 8 (eight) hours as needed for pain.   Yes [provider]  albuterol (PROVENTIL HFA;VENTOLIN HFA) 108 (90 BASE) MCG/ACT inhaler Inhale 2 puffs into the lungs every 6 (six) hours as needed for wheezing or shortness of breath. Reported on 11/29/2015   Yes [provider]  atorvastatin (LIPITOR) 80 MG tablet Take 80 mg by mouth daily.    Yes [provider]  clopidogrel (PLAVIX) 75 MG tablet Take 75 mg by mouth daily.   Yes [provider]  DULoxetine (CYMBALTA) 60 MG capsule Take 60 mg by mouth daily.   Yes [provider]  feeding supplement, ENSURE ENLIVE, (ENSURE ENLIVE) LIQD Take 237 mLs by mouth 2 (two) times daily between meals. 09/25/17  Yes Oswald Hillock, MD  fenofibrate (TRICOR) 145 MG tablet Take 145 mg by mouth daily.    Yes [provider]  folic acid (FOLVITE) 1 MG tablet Take 1 tablet (1 mg total) by mouth daily. 09/19/17  Yes Sheikh, Omair Latif, DO  furosemide (LASIX) 40 MG  tablet Take 40 mg by mouth daily. 11/13/17  Yes [provider]  guaiFENesin (MUCINEX) 600 MG 12 hr tablet Take 600 mg by mouth as needed for cough or to loosen phlegm.   Yes [provider]  loperamide (IMODIUM) 2 MG capsule Take 1-2 capsules (2-4 mg total) by mouth as needed for diarrhea or loose stools. 09/18/17  Yes Sheikh, Omair Latif, DO  metoprolol succinate (TOPROL-XL) 50 MG 24 hr tablet Take 1 tablet (50 mg total) by mouth daily. Take with or immediately following a meal. 09/19/17  Yes Duke, Tami Lin, PA  midodrine (PROAMATINE) 2.5 MG tablet Take 1 tablet (2.5 mg total) by mouth 3 (three) times daily with meals. 09/25/17  Yes Oswald Hillock, MD  MYRBETRIQ 50 MG TB24 tablet Take 50 mg by mouth daily.  05/03/15  Yes [provider]  NITROSTAT 0.4 MG SL tablet Place 0.4 mg under the tongue every 5 (five) minutes as needed for chest pain. Reported on 11/29/2015 05/29/13  Yes [provider]  omeprazole (PRILOSEC) 40 MG capsule TAKE ONE CAPSULE BY MOUTH TWICE DAILY 04/22/16  Yes Armbruster, Carlota Raspberry, MD  QUEtiapine (SEROQUEL) 25 MG tablet Take 1 tablet (25 mg total) by mouth at bedtime. 09/25/17  Yes Oswald Hillock, MD  SYMBICORT 160-4.5 MCG/ACT inhaler INHALE TWO PUFFS INTO LUNGS TWICE DAILY 04/19/14  Yes Clance, Armando Reichert, MD  thiamine 100 MG tablet Take 1 tablet (100 mg total) by mouth daily. 09/19/17  Yes Sheikh, Omair Latif, DO  tiZANidine (ZANAFLEX) 4 MG capsule Take 4 mg by mouth 3 (three) times daily as needed for muscle spasms.    Yes [provider]  vitamin B-12 (CYANOCOBALAMIN) 1000 MCG tablet Take 1 tablet (1,000 mcg total) by mouth daily. 09/25/17  Yes Lama, Marge Duncans, MD  VOLTAREN 1 % GEL Apply 4 g topically 4 (four) times daily as needed for pain. 09/11/17   [provider]    Current Facility-Administered Medications  Medication Dose Route Frequency Provider Last Rate Last Dose  . 0.9 %  sodium chloride infusion   Intravenous Continuous  Eugenie Filler, MD 75 mL/hr at 11/22/17 2244 75 mL at 11/22/17 2244  . acetaminophen (TYLENOL) tablet 650 mg  650 mg Oral Q6H PRN Radene Gunning, NP   650 mg at 11/22/17 1610   Or  . acetaminophen (TYLENOL) suppository 650 mg  650 mg Rectal Q6H PRN Radene Gunning, NP      . atorvastatin (LIPITOR) tablet 80 mg  80 mg Oral Daily Radene Gunning, NP   80 mg at 11/23/17 0849  . budesonide (PULMICORT) nebulizer solution 0.5 mg  0.5 mg Nebulization BID Eugenie Filler, MD   0.5 mg at 11/23/17 0737  . clopidogrel (PLAVIX) tablet 75 mg  75 mg Oral Daily Radene Gunning, NP   75 mg at 11/23/17 0849  . DULoxetine (CYMBALTA) DR capsule 60 mg  60 mg Oral Daily Radene Gunning, NP   60 mg at 11/23/17 9604  . feeding supplement (BOOST / RESOURCE BREEZE) liquid 1 Container  1 Container Oral TID BM Eugenie Filler, MD   1 Container at 11/22/17 1013  . folic acid (FOLVITE) tablet 1 mg  1 mg Oral Daily Radene Gunning, NP   1 mg at 11/23/17 0850  . guaiFENesin (MUCINEX) 12 hr tablet 1,200 mg  1,200 mg Oral BID Dyanne Carrel M, NP   1,200 mg at 11/23/17 0849  . HYDROcodone-homatropine (HYCODAN) 5-1.5 MG/5ML syrup 5 mL  5 mL Oral Q4H PRN Eugenie Filler, MD   5 mL at 11/23/17 0848  . ipratropium (ATROVENT) nebulizer solution 0.5 mg  0.5 mg Nebulization Q2H PRN Eugenie Filler, MD      . ipratropium (ATROVENT) nebulizer solution 0.5 mg  0.5 mg Nebulization BID Eugenie Filler, MD   0.5 mg at 11/23/17 5409  . ketorolac (TORADOL) 15 MG/ML injection 15 mg  15 mg Intravenous Q6H PRN Eugenie Filler, MD   15 mg at 11/23/17 0848  . levalbuterol (XOPENEX) nebulizer solution 0.63 mg  0.63 mg Nebulization Q2H PRN Eugenie Filler, MD      . levalbuterol Penne Lash) nebulizer solution 0.63 mg  0.63 mg Nebulization BID Grandville Silos,  Malachy Moan, MD   0.63 mg at 11/23/17 9767  . loperamide (IMODIUM) capsule 2-4 mg  2-4 mg Oral PRN Radene Gunning, NP      . loratadine (CLARITIN) tablet 10 mg  10 mg Oral Daily Eugenie Filler, MD   10 mg at 11/23/17 0851  . LORazepam (ATIVAN) tablet 1 mg  1 mg Oral Q6H PRN Radene Gunning, NP       Or  . LORazepam (ATIVAN) injection 1 mg  1 mg Intravenous Q6H PRN Radene Gunning, NP      . MEDLINE mouth rinse  15 mL Mouth Rinse BID Eugenie Filler, MD   15 mL at 11/22/17 2125  . methocarbamol (ROBAXIN) 500 mg in dextrose 5 % 50 mL IVPB  500 mg Intravenous Q8H PRN Eugenie Filler, MD   Stopped at 11/22/17 1155  . midodrine (PROAMATINE) tablet 2.5 mg  2.5 mg Oral TID WC Radene Gunning, NP   2.5 mg at 11/22/17 1742  . mirabegron ER (MYRBETRIQ) tablet 50 mg  50 mg Oral Daily Eugenie Filler, MD   50 mg at 11/23/17 3419  . multivitamin with minerals tablet 1 tablet  1 tablet Oral Daily Radene Gunning, NP   1 tablet at 11/23/17 0850  . nicotine (NICODERM CQ - dosed in mg/24 hours) patch 21 mg  21 mg Transdermal Daily Radene Gunning, NP   21 mg at 11/23/17 0853  . ondansetron (ZOFRAN) tablet 4 mg  4 mg Oral Q6H PRN Radene Gunning, NP   4 mg at 11/23/17 0849   Or  . ondansetron (ZOFRAN) injection 4 mg  4 mg Intravenous Q6H PRN Radene Gunning, NP      . pantoprazole (PROTONIX) EC tablet 40 mg  40 mg Oral Q0600 Eugenie Filler, MD   40 mg at 11/23/17 0850  . piperacillin-tazobactam (ZOSYN) IVPB 3.375 g  3.375 g Intravenous Q8H Radene Gunning, NP 12.5 mL/hr at 11/23/17 0847 3.375 g at 11/23/17 0847  . QUEtiapine (SEROQUEL) tablet 25 mg  25 mg Oral QHS Radene Gunning, NP   25 mg at 11/22/17 2126  . thiamine (VITAMIN B-1) tablet 100 mg  100 mg Oral Daily Black, Karen M, NP   100 mg at 11/23/17 3790   Or  . thiamine (B-1) injection 100 mg  100 mg Intravenous Daily Black, Lezlie Octave, NP      . tiZANidine (ZANAFLEX) tablet 4 mg  4 mg Oral TID Radene Gunning, NP   4 mg at 11/23/17 0853  . vancomycin (VANCOCIN) 500 mg in sodium chloride 0.9 % 100 mL IVPB  500 mg Intravenous Q12H Radene Gunning, NP   Stopped at 11/23/17 0100    Allergies as of 11/21/2017 - Review Complete 11/21/2017    Allergen Reaction Noted  . Ivp dye [iodinated diagnostic agents] Hives and Shortness Of Breath 11/07/2011  . Macrodantin [nitrofurantoin] Other (See Comments) 04/28/2008    Family History  Problem Relation Age of Onset  . Stroke Mother   . Rheum arthritis Mother   . Heart disease Father   . Colon cancer Unknown        ? mat. uncle died age 34  . Colon polyps Neg Hx   . Esophageal cancer Neg Hx   . Rectal cancer Neg Hx   . Stomach cancer Neg Hx     Social History   Socioeconomic History  . Marital status: Single  Spouse name: Not on file  . Number of children: 1  . Years of education: Not on file  . Highest education level: Not on file  Occupational History  . Occupation: Network engineer OF [employer]    Employer: [employer]  Social Needs  . Financial resource strain: Not on file  . Food insecurity:    Worry: Not on file    Inability: Not on file  . Transportation needs:    Medical: Not on file    Non-medical: Not on file  Tobacco Use  . Smoking status: Current Every [place] Smoker    Packs/Fojtik: 1.50    Years: 58.00    Pack years: 87.00    Types: Cigarettes  . Smokeless tobacco: Never Used  Substance and Sexual Activity  . Alcohol use: Yes    Alcohol/week: 8.4 oz    Types: 14 Standard drinks or equivalent per week    Comment: every [place], vodka- 1-2 a [place]   . Drug use: No  . Sexual activity: Not on file  Lifestyle  . Physical activity:    Days per week: Not on file    Minutes per session: Not on file  . Stress: Not on file  Relationships  . Social connections:    Talks on phone: Not on file    Gets together: Not on file    Attends religious service: Not on file    Active member of club or organization: Not on file    Attends meetings of clubs or organizations: Not on file    Relationship status: Not on file  . Intimate partner violence:    Fear of current or ex partner: Not on file    Emotionally abused: Not on file    Physically abused: Not on file    Forced  sexual activity: Not on file  Other Topics Concern  . Not on file  Social History Narrative  . Not on file    Review of Systems: ROS is negative except as mentioned in HPI.  Physical Exam: Vital signs in last 24 hours: Temp:  [97.6 F (36.4 C)-99.6 F (37.6 C)] 97.8 F (36.6 C) (03/24 0734) Pulse Rate:  [79-93] 86 (03/24 0738) Resp:  [17-24] 21 (03/24 0738) BP: (102-163)/(77-92) 163/92 (03/24 0734) SpO2:  [96 %-100 %] 96 % (03/24 0738) Weight:  [132 lb 4.4 oz (60 kg)] 132 lb 4.4 oz (60 kg) (03/24 0526) Last BM Date: 11/21/17 General:  Alert, chronically ill-appearing, pleasant and cooperative in NAD Head:  Normocephalic and atraumatic. Eyes:  Sclera clear, no icterus.  Conjunctiva pink. Ears:  Normal auditory acuity. Mouth:  No deformity or lesions.   Lungs:  Decreased BS on the left but CTAB. Heart:  Regular rate and rhythm; no murmurs, clicks, rubs,  or gallops. Abdomen:  Soft, non-distended.  BS present.  Mild epigastric and LUQ TTP. Msk:  Symmetrical without gross deformities. Pulses:  Normal pulses noted. Extremities:  Trace B/L LE edema. Neurologic:  Alert and oriented x 4;  grossly normal neurologically. Skin:  Intact without significant lesions or rashes. Psych:  Alert and cooperative. Normal mood and affect.  Intake/Output from previous [place]: 03/23 0701 - 03/24 0700 In: 2825 [P.O.:720; I.V.:1800; IV Piggyback:305] Out: 570 [Urine:570] Intake/Output this shift: No intake/output data recorded.  Lab Results: Recent Labs    11/21/17 1000 11/22/17 0658 11/23/17 0226  WBC 28.6* 19.6* 17.4*  HGB 10.1* 8.2* 8.8*  HCT 29.5* 25.3* 26.5*  PLT 758* 528* 565*   BMET Recent Labs  11/21/17 1000 11/22/17 0658 11/23/17 0226  NA 130* 128* 130*  K 3.4* 3.6 3.9  CL 92* 95* 100*  CO2 26 25 21*  GLUCOSE 104* 96 121*  BUN 15 13 8   CREATININE 1.35* 0.91 0.88  CALCIUM 8.1* 6.8* 7.1*   LFT Recent Labs    11/23/17 0226  PROT 5.2*  ALBUMIN 1.6*  AST 27  ALT  14*  ALKPHOS 56  BILITOT 0.8   PT/INR Recent Labs    11/21/17 1109  LABPROT 15.4*  INR 1.23   Studies/Results: Ct Abdomen Pelvis Wo Contrast  Result Date: 11/21/2017 CLINICAL DATA:  Lower abdominal pain for several weeks.  Diarrhea. EXAM: CT ABDOMEN AND PELVIS WITHOUT CONTRAST TECHNIQUE: Multidetector CT imaging of the abdomen and pelvis was performed following the standard protocol without IV contrast. COMPARISON:  05/01/2017 FINDINGS: Lower chest: Pericardial effusion measuring up to 16 mm in cross-section. Mild calcific atherosclerotic disease of the coronary arteries. Dense airspace consolidation in the left lower lobe, incompletely visualized with an area of decreased attenuation in the dependent portion of the lung which may represent loculated pleural effusion or area of necrosis. Hepatobiliary: Stable liver cyst. Sludge/gallstones within the dependent portion of the gallbladder. Pancreas: Unremarkable. No pancreatic ductal dilatation or surrounding inflammatory changes. Spleen: Normal in size without focal abnormality. Adrenals/Urinary Tract: Normal adrenal glands. Normal left kidney. Large but benign-appearing right renal cysts, the larger of the 2 measuring 3.5 cm. Stomach/Bowel: Stomach is within normal limits. Post appendectomy. No evidence of bowel wall thickening, distention, or inflammatory changes. Vascular/Lymphatic: Aortic atherosclerosis. No enlarged abdominal or pelvic lymph nodes., Reproductive:  Prostate is unremarkable. Other: No abdominal wall hernia or abnormality. No abdominopelvic ascites. Musculoskeletal: L4-S1 posterior fusion. IMPRESSION: Dense airspace consolidation of the left lower lobe of the lung with hypoattenuated area posteriorly which may represent loculated pleural effusion or area of necrosis. Associated left pleural effusion. Moderate in size pericardial effusion. No evidence of acute abnormalities within the abdomen or pelvis. Electronically Signed   By:  Fidela Salisbury M.D.   On: 11/21/2017 16:03   Dg Chest 1 View  Result Date: 11/22/2017 CLINICAL DATA:  Thoracentesis EXAM: CHEST  1 VIEW COMPARISON:  11/21/2017 FINDINGS: There is no pneumothorax post left thoracentesis. Airspace disease throughout the left lung is stable. Opacity at the left base is stable. Right basilar airspace disease increased. Cardiomegaly. IMPRESSION: No pneumothorax post left thoracentesis. Electronically Signed   By: Marybelle Killings M.D.   On: 11/22/2017 14:56   Dg Chest 2 View  Result Date: 11/21/2017 CLINICAL DATA:  Abdominal pain and hypertension. Shortness of breath. EXAM: CHEST - 2 VIEW COMPARISON:  September 21, 2017 FINDINGS: There is left pleural effusion with consolidation in portions of the inferior lingula and left lower lobe. Right lung is clear except for slight right base atelectasis. Heart is upper normal in size with pulmonary vascularity within normal limits. No adenopathy. There is aortic atherosclerosis. There is evidence of old trauma involving the lateral left clavicle. IMPRESSION: Left pleural effusion with consolidation in portions of the lingula and left lower lobe. Slight right base atelectasis. Lungs elsewhere clear. Heart upper normal in size. No evident adenopathy. There is aortic atherosclerosis. Aortic Atherosclerosis (ICD10-I70.0). Electronically Signed   By: Lowella Grip III M.D.   On: 11/21/2017 10:29   Ct Chest Wo Contrast  Result Date: 11/22/2017 CLINICAL DATA:  Left pleural effusion. EXAM: CT CHEST WITHOUT CONTRAST TECHNIQUE: Multidetector CT imaging of the chest was performed following the standard protocol without IV contrast.  COMPARISON:  Chest radiograph and abdominal CT earlier this [place]. Chest CT 08/07/2017 FINDINGS: Cardiovascular: Aortic atherosclerosis without aneurysm. Small pericardial effusion that is not significantly changed from abdominal CT. Coronary artery calcifications. Mediastinum/Nodes: Lack of IV contrast limits  assessment for hilar adenopathy. Suspect prominent left hilar nodes that are obscured by adjacent lung opacity. Small mediastinal nodes are not enlarged by size criteria. No esophageal thickening. Surgical clip adjacent to the posterior left thyroid lobe. Lungs/Pleura: Left pleural effusion, moderate in size that measures simple fluid density. There is near complete opacification of the left lower lobe. Dependent densities in the upper lobe adjacent to pleural fluid likely atelectasis. Moderate emphysema. No evidence of endobronchial lesion. No pulmonary edema. Mild dependent atelectasis in the right lung base. Scattered calcified granuloma. Upper Abdomen: Evaluated on abdominal CT earlier this [place]. Enteric contrast within the colon. Gallstones. Hepatic and right renal cysts. Musculoskeletal: There are no acute or suspicious osseous abnormalities. IMPRESSION: 1. Moderate left pleural effusion measuring simple fluid density. Associated near complete opacification of the left lower lobe may be atelectasis or pneumonia. Dependent atelectasis in the left upper lobe adjacent to pleural fluid. 2. Moderate emphysema. 3. Aortic atherosclerosis and coronary artery calcifications. Small pericardial effusion as seen on abdominal CT. Aortic Atherosclerosis (ICD10-I70.0) and Emphysema (ICD10-J43.9). Electronically Signed   By: Jeb Levering M.D.   On: 11/22/2017 02:11   Dg Chest Port 1 View  Result Date: 11/21/2017 CLINICAL DATA:  Shortness of breath and chest pain EXAM: PORTABLE CHEST 1 VIEW COMPARISON:  November 21, 2017 FINDINGS: There is consolidation throughout the left mid lower lung zones with left pleural effusion. Right lung is clear. Heart is mildly enlarged with pulmonary vascularity within normal limits. No adenopathy. There is aortic atherosclerosis. There are surgical clips to the left of the paratracheal region at the level of T2. There is evidence of old trauma involving the lateral left clavicle. IMPRESSION:  Airspace consolidation most indicative of pneumonia throughout the left mid and lower lung zones with left pleural effusion. Right lung clear. Stable cardiomegaly. There is aortic atherosclerosis. Aortic Atherosclerosis (ICD10-I70.0). Electronically Signed   By: Lowella Grip III M.D.   On: 11/21/2017 12:04   US Thoracentesis Asp Pleural Space W/img Guide  Result Date: 11/22/2017 INDICATION: Acute respiratory failure with hypoxia secondary to healthcare associated pneumonia and loculated pleural effusion. Request for diagnostic and therapeutic thoracentesis. EXAM: ULTRASOUND GUIDED LEFT THORACENTESIS MEDICATIONS: 1% Lidocaine = 12 mL COMPLICATIONS: None immediate. PROCEDURE: An ultrasound guided thoracentesis was thoroughly discussed with the patient and questions answered. The benefits, risks, alternatives and complications were also discussed. The patient understands and wishes to proceed with the procedure. Written consent was obtained. Ultrasound was performed to localize and mark an adequate pocket of fluid in the left chest. The area was then prepped and draped in the normal sterile fashion. 1% Lidocaine was used for local anesthesia. Under ultrasound guidance a 6 Fr Safe-T-Centesis catheter was introduced. Thoracentesis was performed. The catheter was removed and a dressing applied. FINDINGS: A total of approximately 480 mL of hazy yellow fluid was removed. Samples were sent to the laboratory as requested by the clinical team. IMPRESSION: Successful ultrasound guided left thoracentesis yielding 480 mL of pleural fluid. No pneumothorax on post procedure chest X-ray. Read by: Gareth Eagle, PA-C Electronically Signed   By: Markus Daft M.D.   On: 11/22/2017 15:01   IMPRESSION:  *Normocytic anemia:  Ferritin elevated.  This is most c/w anemia of chronic disease and he does not appear  to be having any overt signs of GI blood loss. *LUQ abdominal pain:  Also pain into his right back/chest.  Mostly worse  with coughing, deep breaths, etc.  A significant component of this is likely due to his left pleural effusion. *Poor appetite and weight loss:  This has been present since his spinal fusion a year ago and is likely multifactorial. *PNA with moderate left side pleural effusion in the setting of COPD.  ? If he has an empyema. *Acute on chronic kidney injury:  Certainly contributing to his anemia.  PLAN: *Continue PPI.   *Recommend a single dose of IV iron as inpatient if possible. *Will follow-up with him as an outpatient in about 4-6 weeks to see how much symptoms he has remaining after improvement/resolution of of this pulmonary issue.   Brad Turner  11/23/2017, 8:57 AM  Pager number [phone]  I have reviewed the entire case in detail with the above APP and discussed the plan in detail. We saw the patient together.  Therefore, I agree with the diagnoses recorded above. In addition,  I have personally interviewed and examined the patient and have personally reviewed any abdominal/pelvic CT scan images.  My additional thoughts are as follows:  Labs consistent with anemia of chronic disease (low iron and TIBC, elevated ferritin, nml B12 and folate)  RUQ pain worse with breathing and movement - seems likely from left lower long process.  He has chronic stable dyspeptic symptoms previously worked-up by Dr. Havery Moros, and the patient can be seen in clinic as needed after hospitalization. No plans for endoscopic testing at present.  Signing off - call as needed.   Nelida Meuse III Pager [phone]  Mon-Fri 8a-5p [phone] after 5p, weekends, holidays

## 2017-11-23 NOTE — Progress Notes (Signed)
Son, Gerald Stabs, and friend at the bedside w/patient.  Dr. Darylene Price, MD (CVTS) discussed recommended VATS for tomorrow morning w/all of them via speaker phone in room w/patient's permission.  All questions answered.  This Probation officer proceeded to obtain written consent from both patient and son, considering periods of confusion in patient throughout the [place].  During conversation, patient's O2 sats began dropping to 87-89% on RA; placed back on O2 @ 2L/[place].  Pre-op orders released.

## 2017-11-23 NOTE — Progress Notes (Signed)
Patient assisted x 1 up to [facility] without results; positive flatus.  Patient then ambulated w/assist x 1 to bedside recliner, approx 10 feet, to sit upright for breakfast and breathing treatment. Sats stable at 96% on O2 @ 1L/[place].

## 2017-11-23 NOTE — Progress Notes (Signed)
TCTS BRIEF PROGRESS NOTE   CXR reveals persistent severe consolidation of entire LLL with a significant remaining amount of pleural fluid which appears loculated.  I discussed options with the patient and his son via telephone.  We discussed the indications, risks and potential benefits of left video assisted thoracoscopy and possible thoracotomy for drainage of empyema, and video assisted bronchoscopy for airway exam with endobronchial washings.  They understand and accept all potential associated risks of surgery including but not limited to risk of death, stroke, myocardial infarction, respiratory failure, bleeding requiring blood transfusion and/or reexploration, arrhythmia, persistent or recurrent pneumonia, wound infection, pulmonary embolus or other thromboembolic complication, chronic pain or other delayed complications.  All questions answered.  For OR tomorrow morning.   Rexene Alberts, MD 11/23/2017 5:18 PM

## 2017-11-23 NOTE — Progress Notes (Signed)
Patient continues to answer orientation questions appropriately; however, exhibiting confusion of sorts - asks if this writer can let him go home for a few minutes to get private toiletries?  When explained that patient can not leave the hospital at this time unless against medical advice, patient states "it's OK, you can just drive me there and back" and questions nurse as to why this is not allowed.  Will continue to monitor overall progress.

## 2017-11-23 NOTE — Progress Notes (Signed)
Patient ambulated x 191ft w/front wheeled walker and assist x 1 after sitting in bedside recliner x approx 2 hours.  Patient returned to bed after ambulation.  Remains on room air w/sats 92-93%.  Will continue to monitor progress.

## 2017-11-23 NOTE — Progress Notes (Signed)
   77 year old 1 pack/Penning smoker admitted with left upper quadrant abdominal pain radiating to the back. CT abdomen showing dense left lower lobe consolidation with effusion. Thoracentesis with 450 cc of hazy yellow fluid removed, showing mostly neutrophils and glucose of 20, Gram stain and culture negative so far.  He has defervesced and WBC count has decreased from 28-17 K. He continues to have pain radiating to his back and does not feel but less than 50% improved. Exam shows decreased breath sounds on left, clear on right, S1-S2 normal, no edema.  CT chest personally reviewed by me shows dense left lower lobe consolidation no clear airway obstruction but abrupt cutoff of left lower lobe bronchus due to collapsed lung.  Impression/plan Parapneumonic effusion -appears to be complicated by loculation, glucose less than 20 is suggestive of empyema although fluid was noted to be "hazy yellow" and culture negative so far.  Appears to be clinically improved but not subjectively so, will need TCTS input , if cultures turn positive would certainly need decortication.  Pneumonia, possibly community-acquired, question of postobstructive pneumonia is raised and difficult to ascertain at this point-aggressive approach would be to do bronchoscopy for inspection of airways, otherwise see how he improves with antibiotics alone  P CCM to follow  Kara Mead MD. FCCP. [facility] Pager [phone] If no response call [phone]   11/23/2017

## 2017-11-24 ENCOUNTER — Inpatient Hospital Stay (HOSPITAL_COMMUNITY): Payer: Medicare Other | Admitting: Certified Registered Nurse Anesthetist

## 2017-11-24 ENCOUNTER — Inpatient Hospital Stay (HOSPITAL_COMMUNITY): Payer: Medicare Other

## 2017-11-24 ENCOUNTER — Inpatient Hospital Stay (HOSPITAL_COMMUNITY)
Admission: EM | Disposition: A | Discharge status: Skilled Nursing Facility | Payer: Self-pay | Source: Home / Self Care | Attending: Internal Medicine

## 2017-11-24 ENCOUNTER — Encounter (HOSPITAL_COMMUNITY): Payer: Self-pay | Admitting: *Deleted

## 2017-11-24 DIAGNOSIS — F1010 Alcohol abuse, uncomplicated: Secondary | ICD-10-CM

## 2017-11-24 DIAGNOSIS — Z720 Tobacco use: Secondary | ICD-10-CM

## 2017-11-24 DIAGNOSIS — J9600 Acute respiratory failure, unspecified whether with hypoxia or hypercapnia: Secondary | ICD-10-CM

## 2017-11-24 DIAGNOSIS — J869 Pyothorax without fistula: Secondary | ICD-10-CM

## 2017-11-24 HISTORY — PX: CHEST EXPLORATION: SHX5104

## 2017-11-24 HISTORY — PX: VIDEO BRONCHOSCOPY: SHX5072

## 2017-11-24 HISTORY — PX: VIDEO ASSISTED THORACOSCOPY (VATS)/EMPYEMA: SHX6172

## 2017-11-24 LAB — POCT I-STAT 7, (LYTES, BLD GAS, ICA,H+H)
ACID-BASE DEFICIT: 4 mmol/L — AB (ref 0.0–2.0)
Acid-base deficit: 6 mmol/L — ABNORMAL HIGH (ref 0.0–2.0)
Acid-base deficit: 6 mmol/L — ABNORMAL HIGH (ref 0.0–2.0)
Acid-base deficit: 6 mmol/L — ABNORMAL HIGH (ref 0.0–2.0)
BICARBONATE: 19.6 mmol/L — AB (ref 20.0–28.0)
Bicarbonate: 22 mmol/L (ref 20.0–28.0)
Bicarbonate: 22.7 mmol/L (ref 20.0–28.0)
Bicarbonate: 20.2 mmol/L (ref 20.0–28.0)
CALCIUM ION: 1.06 mmol/L — AB (ref 1.15–1.40)
CALCIUM ION: 0.95 mmol/L — AB (ref 1.15–1.40)
Calcium, Ion: 1.01 mmol/L — ABNORMAL LOW (ref 1.15–1.40)
Calcium, Ion: 0.92 mmol/L — ABNORMAL LOW (ref 1.15–1.40)
HCT: 29 % — ABNORMAL LOW (ref 39.0–52.0)
HCT: 20 % — ABNORMAL LOW (ref 39.0–52.0)
HEMATOCRIT: 28 % — AB (ref 39.0–52.0)
HEMATOCRIT: 19 % — AB (ref 39.0–52.0)
HEMOGLOBIN: 9.5 g/dL — AB (ref 13.0–17.0)
HEMOGLOBIN: 9.9 g/dL — AB (ref 13.0–17.0)
HEMOGLOBIN: 6.5 g/dL — AB (ref 13.0–17.0)
Hemoglobin: 6.8 g/dL — CL (ref 13.0–17.0)
O2 SAT: 100 %
O2 Saturation: 93 %
O2 Saturation: 99 %
O2 Saturation: 99 %
PCO2 ART: 44.9 mmHg (ref 32.0–48.0)
PCO2 ART: 59.5 mmHg — AB (ref 32.0–48.0)
PCO2 ART: 34.4 mmHg (ref 32.0–48.0)
PCO2 ART: 38 mmHg (ref 32.0–48.0)
PH ART: 7.296 — AB (ref 7.350–7.450)
PH ART: 7.355 (ref 7.350–7.450)
PO2 ART: 138 mmHg — AB (ref 83.0–108.0)
POTASSIUM: 4 mmol/L (ref 3.5–5.1)
POTASSIUM: 4 mmol/L (ref 3.5–5.1)
Patient temperature: 36.4
Patient temperature: 35.3
Patient temperature: 35.3
Potassium: 4.1 mmol/L (ref 3.5–5.1)
Potassium: 4.2 mmol/L (ref 3.5–5.1)
SODIUM: 133 mmol/L — AB (ref 135–145)
SODIUM: 134 mmol/L — AB (ref 135–145)
Sodium: 133 mmol/L — ABNORMAL LOW (ref 135–145)
Sodium: 134 mmol/L — ABNORMAL LOW (ref 135–145)
TCO2: 23 mmol/L (ref 22–32)
TCO2: 25 mmol/L (ref 22–32)
TCO2: 21 mmol/L — AB (ref 22–32)
TCO2: 21 mmol/L — AB (ref 22–32)
pH, Arterial: 7.191 — CL (ref 7.350–7.450)
pH, Arterial: 7.325 — ABNORMAL LOW (ref 7.350–7.450)
pO2, Arterial: 394 mmHg — ABNORMAL HIGH (ref 83.0–108.0)
pO2, Arterial: 83 mmHg (ref 83.0–108.0)
pO2, Arterial: 117 mmHg — ABNORMAL HIGH (ref 83.0–108.0)

## 2017-11-24 LAB — PH, BODY FLUID: PH, BODY FLUID: 7

## 2017-11-24 LAB — BASIC METABOLIC PANEL
ANION GAP: 7 (ref 5–15)
Anion gap: 7 (ref 5–15)
BUN: 9 mg/dL (ref 6–20)
BUN: 9 mg/dL (ref 6–20)
CALCIUM: 7.4 mg/dL — AB (ref 8.9–10.3)
CO2: 21 mmol/L — ABNORMAL LOW (ref 22–32)
CO2: 19 mmol/L — ABNORMAL LOW (ref 22–32)
Calcium: 6.8 mg/dL — ABNORMAL LOW (ref 8.9–10.3)
Chloride: 101 mmol/L (ref 101–111)
Chloride: 105 mmol/L (ref 101–111)
Creatinine, Ser: 0.96 mg/dL (ref 0.61–1.24)
Creatinine, Ser: 0.87 mg/dL (ref 0.61–1.24)
GFR calc Af Amer: >60 mL/min (ref >60)
GFR calc Af Amer: >60 mL/min (ref >60)
GFR calc non Af Amer: >60 mL/min (ref >60)
GFR calc non Af Amer: >60 mL/min (ref >60)
GLUCOSE: 90 mg/dL (ref 65–99)
Glucose, Bld: 103 mg/dL — ABNORMAL HIGH (ref 65–99)
POTASSIUM: 3.8 mmol/L (ref 3.5–5.1)
Potassium: 4 mmol/L (ref 3.5–5.1)
Sodium: 129 mmol/L — ABNORMAL LOW (ref 135–145)
Sodium: 131 mmol/L — ABNORMAL LOW (ref 135–145)

## 2017-11-24 LAB — FOLATE RBC
Folate, Hemolysate: 475.1 ng/mL
Folate, RBC: 1908 ng/mL (ref >498)
HEMATOCRIT: 24.9 % — AB (ref 37.5–51.0)

## 2017-11-24 LAB — CBC
HCT: 18.5 % — ABNORMAL LOW (ref 39.0–52.0)
HCT: 32.2 % — ABNORMAL LOW (ref 39.0–52.0)
HCT: 26.8 % — ABNORMAL LOW (ref 39.0–52.0)
HEMOGLOBIN: 5.9 g/dL — AB (ref 13.0–17.0)
Hemoglobin: 10.9 g/dL — ABNORMAL LOW (ref 13.0–17.0)
Hemoglobin: 9.3 g/dL — ABNORMAL LOW (ref 13.0–17.0)
MCH: 28.8 pg (ref 26.0–34.0)
MCH: 29.7 pg (ref 26.0–34.0)
MCH: 29.1 pg (ref 26.0–34.0)
MCHC: 31.9 g/dL (ref 30.0–36.0)
MCHC: 33.9 g/dL (ref 30.0–36.0)
MCHC: 34.7 g/dL (ref 30.0–36.0)
MCV: 90.2 fL (ref 78.0–100.0)
MCV: 87.7 fL (ref 78.0–100.0)
MCV: 83.8 fL (ref 78.0–100.0)
Platelets: 397 10*3/uL (ref 150–400)
Platelets: 244 10*3/uL (ref 150–400)
Platelets: 221 10*3/uL (ref 150–400)
RBC: 2.05 MIL/uL — AB (ref 4.22–5.81)
RBC: 3.67 MIL/uL — ABNORMAL LOW (ref 4.22–5.81)
RBC: 3.2 MIL/uL — ABNORMAL LOW (ref 4.22–5.81)
RDW: 14.6 % (ref 11.5–15.5)
RDW: 15.1 % (ref 11.5–15.5)
RDW: 15.3 % (ref 11.5–15.5)
WBC: 17.6 10*3/uL — AB (ref 4.0–10.5)
WBC: 12.8 10*3/uL — ABNORMAL HIGH (ref 4.0–10.5)
WBC: 13.2 10*3/uL — ABNORMAL HIGH (ref 4.0–10.5)

## 2017-11-24 LAB — CBC WITH DIFFERENTIAL/PLATELET
BASOS ABS: 0 10*3/uL (ref 0.0–0.1)
Basophils Relative: 0 %
Eosinophils Absolute: 0 10*3/uL (ref 0.0–0.7)
Eosinophils Relative: 0 %
HEMATOCRIT: 24.9 % — AB (ref 39.0–52.0)
Hemoglobin: 8.1 g/dL — ABNORMAL LOW (ref 13.0–17.0)
Lymphocytes Relative: 11 %
Lymphs Abs: 1.6 10*3/uL (ref 0.7–4.0)
MCH: 30.2 pg (ref 26.0–34.0)
MCHC: 32.5 g/dL (ref 30.0–36.0)
MCV: 92.9 fL (ref 78.0–100.0)
MONO ABS: 1.3 10*3/uL — AB (ref 0.1–1.0)
Monocytes Relative: 9 %
NEUTROS ABS: 12.6 10*3/uL — AB (ref 1.7–7.7)
Neutrophils Relative %: 80 %
Platelets: 578 10*3/uL — ABNORMAL HIGH (ref 150–400)
RBC: 2.68 MIL/uL — ABNORMAL LOW (ref 4.22–5.81)
RDW: 13.4 % (ref 11.5–15.5)
WBC: 15.7 10*3/uL — ABNORMAL HIGH (ref 4.0–10.5)

## 2017-11-24 LAB — POCT I-STAT 4, (NA,K, GLUC, HGB,HCT)
Glucose, Bld: 102 mg/dL — ABNORMAL HIGH (ref 65–99)
HEMATOCRIT: 33 % — AB (ref 39.0–52.0)
HEMOGLOBIN: 11.2 g/dL — AB (ref 13.0–17.0)
Potassium: 4.1 mmol/L (ref 3.5–5.1)
Sodium: 135 mmol/L (ref 135–145)

## 2017-11-24 LAB — DIC (DISSEMINATED INTRAVASCULAR COAGULATION) PANEL
D DIMER QUANT: 3.49 ug{FEU}/mL — AB (ref 0.00–0.50)
FIBRINOGEN: 534 mg/dL — AB (ref 210–475)
INR: 1.28
PLATELETS: 544 10*3/uL — AB (ref 150–400)
PROTHROMBIN TIME: 15.9 s — AB (ref 11.4–15.2)

## 2017-11-24 LAB — TRIGLYCERIDES: TRIGLYCERIDES: 76 mg/dL (ref <150)

## 2017-11-24 LAB — POCT I-STAT 3, ART BLOOD GAS (G3+)
Acid-base deficit: 6 mmol/L — ABNORMAL HIGH (ref 0.0–2.0)
Bicarbonate: 18.9 mmol/L — ABNORMAL LOW (ref 20.0–28.0)
O2 Saturation: 99 %
PCO2 ART: 34.6 mmHg (ref 32.0–48.0)
PH ART: 7.341 — AB (ref 7.350–7.450)
TCO2: 20 mmol/L — ABNORMAL LOW (ref 22–32)
pO2, Arterial: 137 mmHg — ABNORMAL HIGH (ref 83.0–108.0)

## 2017-11-24 LAB — PREPARE RBC (CROSSMATCH)

## 2017-11-24 LAB — LEGIONELLA PNEUMOPHILA SEROGP 1 UR AG: L. pneumophila Serogp 1 Ur Ag: NEGATIVE

## 2017-11-24 LAB — PROTIME-INR
INR: 1.4
Prothrombin Time: 17 seconds — ABNORMAL HIGH (ref 11.4–15.2)

## 2017-11-24 LAB — MAGNESIUM: Magnesium: 1.9 mg/dL (ref 1.7–2.4)

## 2017-11-24 LAB — DIC (DISSEMINATED INTRAVASCULAR COAGULATION)PANEL
Smear Review: NONE SEEN
aPTT: 27 seconds (ref 24–36)

## 2017-11-24 LAB — LACTIC ACID, PLASMA
Lactic Acid, Venous: 0.8 mmol/L (ref 0.5–1.9)
Lactic Acid, Venous: 0.7 mmol/L (ref 0.5–1.9)

## 2017-11-24 LAB — APTT: aPTT: 29 seconds (ref 24–36)

## 2017-11-24 SURGERY — VIDEO ASSISTED THORACOSCOPY (VATS)/EMPYEMA
Anesthesia: General | Site: Chest

## 2017-11-24 MED ORDER — METHYLPREDNISOLONE SODIUM SUCC 125 MG IJ SOLR: 80.0000 mg | Freq: Four times a day (QID) | INTRAMUSCULAR | Status: DC

## 2017-11-24 MED ORDER — INSULIN ASPART 100 UNIT/ML ~~LOC~~ SOLN
0.0000 [IU] | Freq: Four times a day (QID) | SUBCUTANEOUS | Status: DC
  Administered 2017-11-28: 2 [IU] via SUBCUTANEOUS

## 2017-11-24 MED ORDER — FENTANYL CITRATE (PF) 100 MCG/2ML IJ SOLN: 25.0000 ug | INTRAMUSCULAR | Status: DC | PRN

## 2017-11-24 MED ORDER — SODIUM CHLORIDE 0.9% FLUSH
10.0000 mL | Freq: Two times a day (BID) | INTRAVENOUS | Status: DC
  Administered 2017-11-24 – 2017-11-27 (×5): 10 mL
  Administered 2017-11-27 – 2017-11-28 (×2): 20 mL
  Administered 2017-11-28: 10 mL
  Administered 2017-11-29: 30 mL
  Administered 2017-11-29 – 2017-11-30 (×3): 10 mL
  Administered 2017-12-01: 20 mL

## 2017-11-24 MED ORDER — ACETAMINOPHEN 500 MG PO TABS
1000.0000 mg | ORAL_TABLET | Freq: Four times a day (QID) | ORAL | Status: DC
  Administered 2017-11-27 (×2): 1000 mg via ORAL
  Filled 2017-11-24 (×2): qty 2

## 2017-11-24 MED ORDER — IPRATROPIUM BROMIDE 0.02 % IN SOLN
0.5000 mg | Freq: Four times a day (QID) | RESPIRATORY_TRACT | Status: DC
  Administered 2017-11-24 – 2017-11-26 (×7): 0.5 mg via RESPIRATORY_TRACT
  Filled 2017-11-24 (×7): qty 2.5

## 2017-11-24 MED ORDER — ACETAMINOPHEN 160 MG/5ML PO SOLN
1000.0000 mg | Freq: Four times a day (QID) | ORAL | Status: DC
  Administered 2017-11-25 (×2): 1000 mg via ORAL
  Filled 2017-11-24 (×2): qty 40.6

## 2017-11-24 MED ORDER — SODIUM CHLORIDE 0.9 % IV SOLN: Freq: Once | INTRAVENOUS | Status: DC

## 2017-11-24 MED ORDER — FENTANYL CITRATE (PF) 250 MCG/5ML IJ SOLN
INTRAMUSCULAR | Status: AC
  Filled 2017-11-24: qty 5

## 2017-11-24 MED ORDER — DEXAMETHASONE SODIUM PHOSPHATE 10 MG/ML IJ SOLN
INTRAMUSCULAR | Status: AC
  Filled 2017-11-24: qty 2

## 2017-11-24 MED ORDER — MIDAZOLAM HCL 2 MG/2ML IJ SOLN
INTRAMUSCULAR | Status: AC
  Filled 2017-11-24: qty 2

## 2017-11-24 MED ORDER — OXYCODONE HCL 5 MG PO TABS: 5.0000 mg | ORAL_TABLET | Freq: Once | ORAL | Status: DC | PRN

## 2017-11-24 MED ORDER — FENTANYL CITRATE (PF) 100 MCG/2ML IJ SOLN
100.0000 ug | INTRAMUSCULAR | Status: DC | PRN
  Administered 2017-11-25: 100 ug via INTRAVENOUS
  Filled 2017-11-24 (×2): qty 2

## 2017-11-24 MED ORDER — HEMOSTATIC AGENTS (NO CHARGE) OPTIME
TOPICAL | Status: DC | PRN
  Administered 2017-11-24 (×3): 1 via TOPICAL

## 2017-11-24 MED ORDER — THROMBIN (RECOMBINANT) 5000 UNITS EX SOLR
CUTANEOUS | Status: AC
  Filled 2017-11-24: qty 5000

## 2017-11-24 MED ORDER — OXYCODONE HCL 5 MG/5ML PO SOLN: 5.0000 mg | Freq: Once | ORAL | Status: DC | PRN

## 2017-11-24 MED ORDER — ONDANSETRON HCL 4 MG/2ML IJ SOLN
4.0000 mg | Freq: Four times a day (QID) | INTRAMUSCULAR | Status: DC | PRN
  Administered 2017-11-29: 4 mg via INTRAVENOUS
  Filled 2017-11-24: qty 2

## 2017-11-24 MED ORDER — ONDANSETRON HCL 4 MG/2ML IJ SOLN
INTRAMUSCULAR | Status: DC | PRN
  Administered 2017-11-24: 4 mg via INTRAVENOUS

## 2017-11-24 MED ORDER — FENTANYL CITRATE (PF) 100 MCG/2ML IJ SOLN
INTRAMUSCULAR | Status: DC | PRN
  Administered 2017-11-24: 50 ug via INTRAVENOUS
  Administered 2017-11-24: 100 ug via INTRAVENOUS
  Administered 2017-11-24 (×4): 50 ug via INTRAVENOUS

## 2017-11-24 MED ORDER — ALBUMIN HUMAN 5 % IV SOLN
INTRAVENOUS | Status: DC | PRN
  Administered 2017-11-24 (×2): via INTRAVENOUS

## 2017-11-24 MED ORDER — PROPOFOL 10 MG/ML IV BOLUS
INTRAVENOUS | Status: AC
  Filled 2017-11-24: qty 40

## 2017-11-24 MED ORDER — LEVALBUTEROL HCL 0.63 MG/3ML IN NEBU
0.6300 mg | INHALATION_SOLUTION | Freq: Four times a day (QID) | RESPIRATORY_TRACT | Status: DC
  Administered 2017-11-24 – 2017-11-26 (×7): 0.63 mg via RESPIRATORY_TRACT
  Filled 2017-11-24 (×7): qty 3

## 2017-11-24 MED ORDER — THROMBIN 5000 UNITS EX SOLR
CUTANEOUS | Status: DC | PRN
  Administered 2017-11-24: 5000 [IU] via TOPICAL

## 2017-11-24 MED ORDER — SODIUM CHLORIDE 0.9 % IV SOLN
INTRAVENOUS | Status: DC | PRN
  Administered 2017-11-24 (×2): via INTRAVENOUS

## 2017-11-24 MED ORDER — ONDANSETRON HCL 4 MG/2ML IJ SOLN: 4.0000 mg | Freq: Once | INTRAMUSCULAR | Status: DC | PRN

## 2017-11-24 MED ORDER — CHLORHEXIDINE GLUCONATE 0.12% ORAL RINSE (MEDLINE KIT)
15.0000 mL | Freq: Two times a day (BID) | OROMUCOSAL | Status: DC
  Administered 2017-11-25 (×2): 15 mL via OROMUCOSAL

## 2017-11-24 MED ORDER — ORAL CARE MOUTH RINSE: 15.0000 mL | Freq: Four times a day (QID) | OROMUCOSAL | Status: DC

## 2017-11-24 MED ORDER — ONDANSETRON HCL 4 MG/2ML IJ SOLN
INTRAMUSCULAR | Status: AC
  Filled 2017-11-24: qty 2

## 2017-11-24 MED ORDER — CHLORHEXIDINE GLUCONATE CLOTH 2 % EX PADS
6.0000 | MEDICATED_PAD | Freq: Every day | CUTANEOUS | Status: DC
  Administered 2017-11-24 – 2017-11-29 (×6): 6 via TOPICAL

## 2017-11-24 MED ORDER — SENNOSIDES-DOCUSATE SODIUM 8.6-50 MG PO TABS
1.0000 | ORAL_TABLET | Freq: Every day | ORAL | Status: DC
  Administered 2017-11-24: 1 via ORAL
  Filled 2017-11-24 (×2): qty 1

## 2017-11-24 MED ORDER — HEMOSTATIC AGENTS (NO CHARGE) OPTIME
TOPICAL | Status: DC | PRN
  Administered 2017-11-24: 1 via TOPICAL

## 2017-11-24 MED ORDER — PROPOFOL 500 MG/50ML IV EMUL
INTRAVENOUS | Status: DC | PRN
  Administered 2017-11-24: 50 ug/kg/min via INTRAVENOUS

## 2017-11-24 MED ORDER — FENTANYL CITRATE (PF) 100 MCG/2ML IJ SOLN
100.0000 ug | INTRAMUSCULAR | Status: DC | PRN
  Administered 2017-11-24: 100 ug via INTRAVENOUS
  Filled 2017-11-24 (×2): qty 2

## 2017-11-24 MED ORDER — DEXAMETHASONE SODIUM PHOSPHATE 10 MG/ML IJ SOLN
INTRAMUSCULAR | Status: DC | PRN
  Administered 2017-11-24: 10 mg via INTRAVENOUS

## 2017-11-24 MED ORDER — SODIUM CHLORIDE 0.9% FLUSH
10.0000 mL | INTRAVENOUS | Status: DC | PRN
  Administered 2017-11-25: 10 mL
  Filled 2017-11-24: qty 40

## 2017-11-24 MED ORDER — THROMBIN 5000 UNITS EX SOLR
CUTANEOUS | Status: AC
Start: 2017-11-24 — End: 2017-11-24
  Filled 2017-11-24: qty 5000

## 2017-11-24 MED ORDER — ROCURONIUM BROMIDE 100 MG/10ML IV SOLN
INTRAVENOUS | Status: DC | PRN
  Administered 2017-11-24: 20 mg via INTRAVENOUS
  Administered 2017-11-24: 50 mg via INTRAVENOUS
  Administered 2017-11-24: 20 mg via INTRAVENOUS

## 2017-11-24 MED ORDER — BISACODYL 5 MG PO TBEC
10.0000 mg | DELAYED_RELEASE_TABLET | Freq: Every day | ORAL | Status: DC
  Administered 2017-11-27 – 2017-12-02 (×2): 10 mg via ORAL
  Filled 2017-11-24 (×3): qty 2

## 2017-11-24 MED ORDER — LACTATED RINGERS IV SOLN
INTRAVENOUS | Status: DC
  Administered 2017-11-24 (×3): via INTRAVENOUS

## 2017-11-24 MED ORDER — LIDOCAINE HCL (CARDIAC) 20 MG/ML IV SOLN
INTRAVENOUS | Status: DC | PRN
  Administered 2017-11-24: 50 mg via INTRAVENOUS

## 2017-11-24 MED ORDER — DEXMEDETOMIDINE HCL IN NACL 200 MCG/50ML IV SOLN
0.0000 ug/kg/h | INTRAVENOUS | Status: DC
  Administered 2017-11-24: 0.4 ug/kg/h via INTRAVENOUS
  Filled 2017-11-24: qty 50

## 2017-11-24 MED ORDER — CHLORHEXIDINE GLUCONATE 0.12% ORAL RINSE (MEDLINE KIT)
15.0000 mL | Freq: Two times a day (BID) | OROMUCOSAL | Status: DC
  Administered 2017-11-24: 15 mL via OROMUCOSAL

## 2017-11-24 MED ORDER — PROPOFOL 10 MG/ML IV BOLUS
INTRAVENOUS | Status: DC | PRN
  Administered 2017-11-24: 70 mg via INTRAVENOUS

## 2017-11-24 MED ORDER — ARFORMOTEROL TARTRATE 15 MCG/2ML IN NEBU
15.0000 ug | INHALATION_SOLUTION | Freq: Two times a day (BID) | RESPIRATORY_TRACT | Status: DC
  Administered 2017-11-24 – 2017-12-03 (×18): 15 ug via RESPIRATORY_TRACT
  Filled 2017-11-24 (×18): qty 2

## 2017-11-24 MED ORDER — FENTANYL CITRATE (PF) 100 MCG/2ML IJ SOLN
INTRAMUSCULAR | Status: AC
  Filled 2017-11-24: qty 2

## 2017-11-24 MED ORDER — 0.9 % SODIUM CHLORIDE (POUR BTL) OPTIME
TOPICAL | Status: DC | PRN
  Administered 2017-11-24 (×2): 3000 mL

## 2017-11-24 MED ORDER — PROPOFOL 1000 MG/100ML IV EMUL
0.0000 ug/kg/min | INTRAVENOUS | Status: DC
  Administered 2017-11-24 – 2017-11-25 (×3): 50 ug/kg/min via INTRAVENOUS
  Filled 2017-11-24 (×3): qty 100

## 2017-11-24 MED ORDER — HEMOSTATIC AGENTS (NO CHARGE) OPTIME
TOPICAL | Status: DC | PRN
  Administered 2017-11-24 (×2): 1 via TOPICAL

## 2017-11-24 MED ORDER — PHENYLEPHRINE HCL 10 MG/ML IJ SOLN
INTRAVENOUS | Status: DC | PRN
  Administered 2017-11-24: 20 ug/min via INTRAVENOUS

## 2017-11-24 MED ORDER — POTASSIUM CHLORIDE IN NACL 20-0.9 MEQ/L-% IV SOLN
INTRAVENOUS | Status: DC
  Administered 2017-11-24 – 2017-11-26 (×3): via INTRAVENOUS
  Filled 2017-11-24 (×6): qty 1000

## 2017-11-24 MED ORDER — POTASSIUM CHLORIDE 10 MEQ/50ML IV SOLN: 10.0000 meq | Freq: Every day | INTRAVENOUS | Status: DC | PRN

## 2017-11-24 MED ORDER — ORAL CARE MOUTH RINSE
15.0000 mL | Freq: Four times a day (QID) | OROMUCOSAL | Status: DC
  Administered 2017-11-25 (×4): 15 mL via OROMUCOSAL

## 2017-11-24 SURGICAL SUPPLY — 99 items
APL SRG 22X2 LUM MLBL SLNT (VASCULAR PRODUCTS)
APPLICATOR TIP COSEAL (VASCULAR PRODUCTS) IMPLANT
APPLICATOR TIP EXT COSEAL (VASCULAR PRODUCTS) IMPLANT
BANDAGE HEMOSTAT MRDH 4X4 STRL (MISCELLANEOUS) ×2 IMPLANT
BLADE SURG 11 STRL SS (BLADE) ×6 IMPLANT
BNDG HEMOSTAT MRDH 4X4 STRL (MISCELLANEOUS) ×3
BRUSH CYTOL CELLEBRITY 1.5X140 (MISCELLANEOUS) IMPLANT
CANISTER SUCT 3000ML PPV (MISCELLANEOUS) ×6 IMPLANT
CATH KIT ON Q 5IN SLV (PAIN MANAGEMENT) IMPLANT
CATH THORACIC 28FR (CATHETERS) ×3 IMPLANT
CATH THORACIC 36FR (CATHETERS) ×3 IMPLANT
CATH THORACIC 36FR RT ANG (CATHETERS) IMPLANT
CLEANER TIP ELECTROSURG 2X2 (MISCELLANEOUS) ×3 IMPLANT
CLIP VESOCCLUDE MED 6/CT (CLIP) IMPLANT
CONN 1/4X1/4X1/4 Y GISH (MISCELLANEOUS) ×3 IMPLANT
CONN ST 1/4X3/8  BEN (MISCELLANEOUS) ×5
CONN ST 1/4X3/8 BEN (MISCELLANEOUS) ×10 IMPLANT
CONN Y 3/8X3/8X3/8  BEN (MISCELLANEOUS)
CONN Y 3/8X3/8X3/8 BEN (MISCELLANEOUS) IMPLANT
CONT SPEC 4OZ CLIKSEAL STRL BL (MISCELLANEOUS) ×6 IMPLANT
COVER BACK TABLE 60X90IN (DRAPES) ×3 IMPLANT
COVER SURGICAL LIGHT HANDLE (MISCELLANEOUS) ×6 IMPLANT
DERMABOND ADVANCED (GAUZE/BANDAGES/DRESSINGS)
DERMABOND ADVANCED .7 DNX12 (GAUZE/BANDAGES/DRESSINGS) IMPLANT
DRAIN CHANNEL 28F RND 3/8 FF (WOUND CARE) ×6 IMPLANT
DRAPE CHEST BREAST 15X10 FENES (DRAPES) ×6 IMPLANT
DRAPE LAPAROSCOPIC ABDOMINAL (DRAPES) IMPLANT
DRAPE WARM FLUID 44X44 (DRAPE) ×3 IMPLANT
DRILL BIT 7/64X5 (BIT) IMPLANT
DRSG TEGADERM 2-3/8X2-3/4 SM (GAUZE/BANDAGES/DRESSINGS) ×3 IMPLANT
ELECT BLADE 4.0 EZ CLEAN MEGAD (MISCELLANEOUS) ×6
ELECT BLADE 6.5 EXT (BLADE) ×6 IMPLANT
ELECT REM PT RETURN 9FT ADLT (ELECTROSURGICAL) ×6
ELECTRODE BLDE 4.0 EZ CLN MEGD (MISCELLANEOUS) ×4 IMPLANT
ELECTRODE REM PT RTRN 9FT ADLT (ELECTROSURGICAL) ×4 IMPLANT
FORCEPS BIOP RJ4 1.8 (CUTTING FORCEPS) IMPLANT
GAUZE SPONGE 2X2 8PLY STRL LF (GAUZE/BANDAGES/DRESSINGS) ×2 IMPLANT
GAUZE SPONGE 4X4 12PLY STRL (GAUZE/BANDAGES/DRESSINGS) ×9 IMPLANT
GAUZE SPONGE 4X4 12PLY STRL LF (GAUZE/BANDAGES/DRESSINGS) ×9 IMPLANT
GLOVE BIO SURGEON STRL SZ 6.5 (GLOVE) ×6 IMPLANT
GLOVE BIOGEL PI IND STRL 6.5 (GLOVE) ×2 IMPLANT
GLOVE BIOGEL PI INDICATOR 6.5 (GLOVE) ×1
GOWN STRL REUS W/ TWL LRG LVL3 (GOWN DISPOSABLE) ×16 IMPLANT
GOWN STRL REUS W/TWL LRG LVL3 (GOWN DISPOSABLE) ×8
HEMOSTAT POWDER SURGIFOAM 1G (HEMOSTASIS) ×3 IMPLANT
HEMOSTAT SURGICEL 2X14 (HEMOSTASIS) ×3 IMPLANT
KIT BASIN OR (CUSTOM PROCEDURE TRAY) ×3 IMPLANT
KIT CLEAN ENDO COMPLIANCE (KITS) ×3 IMPLANT
KIT ROOM TURNOVER OR (KITS) ×3 IMPLANT
KIT SUCTION CATH 14FR (SUCTIONS) ×3 IMPLANT
MARKER SKIN DUAL TIP RULER LAB (MISCELLANEOUS) IMPLANT
NS IRRIG 1000ML POUR BTL (IV SOLUTION) ×12 IMPLANT
OIL SILICONE PENTAX (PARTS (SERVICE/REPAIRS)) ×3 IMPLANT
PACK CHEST (CUSTOM PROCEDURE TRAY) ×6 IMPLANT
PAD ARMBOARD 7.5X6 YLW CONV (MISCELLANEOUS) ×6 IMPLANT
PASSER SUT SWANSON 36MM LOOP (INSTRUMENTS) IMPLANT
SCISSORS LAP 5X35 DISP (ENDOMECHANICALS) IMPLANT
SEALANT PROGEL (MISCELLANEOUS) IMPLANT
SEALANT SURG COSEAL 4ML (VASCULAR PRODUCTS) IMPLANT
SEALANT SURG COSEAL 8ML (VASCULAR PRODUCTS) IMPLANT
SOLUTION ANTI FOG 6CC (MISCELLANEOUS) ×9 IMPLANT
SPONGE GAUZE 2X2 STER 10/PKG (GAUZE/BANDAGES/DRESSINGS) ×1
SPONGE LAP 18X18 X RAY DECT (DISPOSABLE) ×3 IMPLANT
SUT PROLENE 3 0 SH DA (SUTURE) IMPLANT
SUT PROLENE 4 0 RB 1 (SUTURE)
SUT PROLENE 4-0 RB1 .5 CRCL 36 (SUTURE) IMPLANT
SUT SILK  1 MH (SUTURE) ×8
SUT SILK 1 MH (SUTURE) ×16 IMPLANT
SUT SILK 1 TIES 10X30 (SUTURE) IMPLANT
SUT SILK 2 0SH CR/8 30 (SUTURE) IMPLANT
SUT SILK 3 0SH CR/8 30 (SUTURE) ×6 IMPLANT
SUT VIC AB 1 CTX 18 (SUTURE) ×3 IMPLANT
SUT VIC AB 1 CTX 36 (SUTURE)
SUT VIC AB 1 CTX36XBRD ANBCTR (SUTURE) IMPLANT
SUT VIC AB 2-0 CTX 27 (SUTURE) ×6 IMPLANT
SUT VIC AB 2-0 CTX 36 (SUTURE) IMPLANT
SUT VIC AB 3-0 MH 27 (SUTURE) ×12 IMPLANT
SUT VIC AB 3-0 SH 18 (SUTURE) ×9 IMPLANT
SUT VIC AB 3-0 X1 27 (SUTURE) ×6 IMPLANT
SUT VICRYL 0 UR6 27IN ABS (SUTURE) ×6 IMPLANT
SUT VICRYL 2 TP 1 (SUTURE) IMPLANT
SWAB COLLECTION DEVICE MRSA (MISCELLANEOUS) IMPLANT
SWAB CULTURE ESWAB REG 1ML (MISCELLANEOUS) IMPLANT
SYR 20ML ECCENTRIC (SYRINGE) ×3 IMPLANT
SYSTEM SAHARA CHEST DRAIN ATS (WOUND CARE) ×3 IMPLANT
TAPE CLOTH 4X10 WHT NS (GAUZE/BANDAGES/DRESSINGS) ×3 IMPLANT
TAPE CLOTH SURG 4X10 WHT LF (GAUZE/BANDAGES/DRESSINGS) ×9 IMPLANT
TAPE UMBILICAL COTTON 1/8X30 (MISCELLANEOUS) IMPLANT
TIP APPLICATOR SPRAY EXTEND 16 (VASCULAR PRODUCTS) IMPLANT
TOWEL GREEN STERILE (TOWEL DISPOSABLE) ×6 IMPLANT
TOWEL GREEN STERILE FF (TOWEL DISPOSABLE) ×3 IMPLANT
TRAP SPECIMEN MUCOUS 40CC (MISCELLANEOUS) ×9 IMPLANT
TRAY FOLEY CATH SILVER 16FR LF (SET/KITS/TRAYS/PACK) ×3 IMPLANT
TROCAR BLADELESS 12MM (ENDOMECHANICALS) ×3 IMPLANT
TUBE CONNECTING 20X1/4 (TUBING) ×3 IMPLANT
TUBING BULK SUCTION (MISCELLANEOUS) ×3 IMPLANT
TUNNELER SHEATH ON-Q 11GX8 DSP (PAIN MANAGEMENT) IMPLANT
VALVE DISPOSABLE (MISCELLANEOUS) ×6 IMPLANT
WATER STERILE IRR 1000ML POUR (IV SOLUTION) ×6 IMPLANT

## 2017-11-24 NOTE — Anesthesia Procedure Notes (Signed)
Central Venous Catheter Insertion Performed by: Roberts Gaudy, MD, anesthesiologist Start/End3/25/2019 9:05 AM, 11/24/2017 9:15 AM Patient location: Pre-op. Preanesthetic checklist: patient identified, IV checked, site marked, risks and benefits discussed, surgical consent, monitors and equipment checked, pre-op evaluation, timeout performed and anesthesia consent Lidocaine 1% used for infiltration and patient sedated Hand hygiene performed  and maximum sterile barriers used  Catheter size: 8 Fr Total catheter length 16. Central line was placed.Double lumen Procedure performed using ultrasound guided technique. Ultrasound Notes:image(s) printed for medical record Attempts: 1 Following insertion, dressing applied and line sutured. Post procedure assessment: blood return through all ports  Patient tolerated the procedure well with no immediate complications.

## 2017-11-24 NOTE — Anesthesia Procedure Notes (Signed)
Arterial Line Insertion Start/End3/25/2019 8:30 AM, 11/24/2017 8:33 AM Performed by: Inda Coke, CRNA, CRNA  Preanesthetic checklist: patient identified, IV checked, site marked, risks and benefits discussed, surgical consent, monitors and equipment checked, pre-op evaluation, timeout performed and anesthesia consent Lidocaine 1% used for infiltration Left, radial was placed Catheter size: 20 G Hand hygiene performed  and maximum sterile barriers used  Allen's test indicative of satisfactory collateral circulation Attempts: 1 Procedure performed without using ultrasound guided technique. Ultrasound Notes:anatomy identified Following insertion, dressing applied and Biopatch. Post procedure assessment: normal  Patient tolerated the procedure well with no immediate complications.

## 2017-11-24 NOTE — Anesthesia Preprocedure Evaluation (Signed)
Anesthesia Evaluation  Patient identified by MRN, date of birth, ID band Patient awake    Reviewed: Allergy & Precautions, NPO status , Patient's Chart, lab work & pertinent test results  Airway Mallampati: II  TM Distance: >3 FB Neck ROM: Full    Dental  (+) Teeth Intact, Dental Advisory Given   Pulmonary Current Smoker,    + rhonchi  + decreased breath sounds      Cardiovascular hypertension,  Rhythm:Regular Rate:Normal     Neuro/Psych    GI/Hepatic   Endo/Other    Renal/GU      Musculoskeletal   Abdominal   Peds  Hematology   Anesthesia Other Findings   Reproductive/Obstetrics                             Anesthesia Physical Anesthesia Plan  ASA: III  Anesthesia Plan: General   Post-op Pain Management:    Induction: Intravenous  PONV Risk Score and Plan: Ondansetron and Dexamethasone  Airway Management Planned: Double Lumen EBT  Additional Equipment: Arterial line, CVP and Ultrasound Guidance Line Placement  Intra-op Plan:   Post-operative Plan: Possible Post-op intubation/ventilation  Informed Consent: I have reviewed the patients History and Physical, chart, labs and discussed the procedure including the risks, benefits and alternatives for the proposed anesthesia with the patient or authorized representative who has indicated his/her understanding and acceptance.   Dental advisory given  Plan Discussed with: CRNA and Anesthesiologist  Anesthesia Plan Comments: (Patient is confused and lethargic with mildly labored respirations. May need post-op ventilation)        Anesthesia Quick Evaluation

## 2017-11-24 NOTE — Brief Op Note (Addendum)
11/21/2017 - 11/24/2017  1:47 PM  PATIENT:  Brad Turner  77 y.o. male  PRE-OPERATIVE DIAGNOSIS:  Left Empyema  POST-OPERATIVE DIAGNOSIS:  Left Empyema  PROCEDURE:  VIDEO BRONCHOSCOPY, LEFT VIDEO ASSISTED THORACOSCOPY (VATS), DRAINAGE of LEFT EMPYEMA, RE EXPLORATION for BLEEDING   SURGEON:  Surgeon(s) and Role:    [name], MD - Primary  PHYSICIAN ASSISTANT: Lars Pinks PA-C  ANESTHESIA:   general  EBL:  300 mL   BLOOD ADMINISTERED:Three CC PRBC, 2 FFP, and 2 Ablumin  DRAINS: 2 32 Blake drains placed in the left pleural space   SPECIMEN:  Source of Specimen:  Left pleural fluid and empyema  DISPOSITION OF SPECIMEN:  Culture and pathology  COUNTS CORRECT:  YES  DICTATION: .Dragon Dictation  PLAN OF CARE: Admit to inpatient   PATIENT DISPOSITION:  ICU - intubated and hemodynamically stable.   Delay start of Pharmacological VTE agent (>24hrs) due to surgical blood loss or risk of bleeding: yes

## 2017-11-24 NOTE — Transfer of Care (Signed)
Immediate Anesthesia Transfer of Care Note  Patient: Brad Turner  Procedure(s) Performed: VIDEO ASSISTED THORACOSCOPY (VATS)/EMPYEMA (Left Chest) VIDEO BRONCHOSCOPY (N/A ) CHEST EXPLORATION (Left Chest)  Patient Location: SICU  Anesthesia Type:General  Level of Consciousness: Patient remains intubated per anesthesia plan  Airway & Oxygen Therapy: Patient remains intubated per anesthesia plan and Patient placed on Ventilator (see vital sign flow sheet for setting)  Post-op Assessment: Report given to RN and Post -op Vital signs reviewed and stable  Post vital signs: Reviewed and stable  Last Vitals:  Vitals Value Taken Time  BP 156/85 11/24/2017  5:26 PM  Temp    Pulse 69 11/24/2017  5:30 PM  Resp 21 11/24/2017  5:36 PM  SpO2    Vitals shown include unvalidated device data.  Last Pain:  Vitals:   11/24/17 0728  TempSrc: Oral  PainSc: 0-No pain      Patients Stated Pain Goal: 2 (38/45/[id number])  Complications: No apparent anesthesia complications

## 2017-11-24 NOTE — Progress Notes (Addendum)
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  Patient did endorse decreased oral intake over the past 6-7 months, early satiety, 35 pound weight loss over the past year, generalized weakness, bilateral lower extremity edema, which he is on diuretics.  Patient denies any change in his chronic shortness of breath.  Patient denied any chest pain fever or chills. Patient seen in the ED known to have a temperature of 101.9 rectally, tachycardic, hypoxic with sats of 84% on room air, dehydrated with CT of the abdomen and pelvis showing dense airspace consolidation of the left lower lobe of the lung with hypoattenuation area posteriorly which may represent loculated pleural effusion no area of necrosis.  Moderate in size pericardial effusion.  No evidence of acute abnormalities within the abdomen or pelvis. Patient admitted placed on breathing treatments, underwent therapeutic and diagnostic thoracentesis with body fluids by light's criteria concerning for exudative pleural effusion.  Glucose < 20, 90% neutrophils.  Cultures pending.  Patient seen by pulmonary.  Cardiothoracic surgery consulted and patient to the OR today (11/24/2017) for VATS procedure and probable bronchoscopy.   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   Tobacco abuse   ETOH abuse   HCAP (healthcare-associated pneumonia)   Pleural effusion   Acute kidney injury ([place])   Abdominal pain   Iron deficiency anemia   Acute urinary retention   Hypokalemia   Hypomagnesemia   Parapneumonic effusion   Sepsis due to pneumonia (HCC)   Empyema, left (HCC)   Empyema of left pleural space (HCC)  #1 acute hypoxic respiratory failure secondary to healthcare associated pneumonia versus postobstructive pneumonia and moderate side left exudative pleural effusion/questionable loculated pleural effusion/empyema/parapneumonic effusion in the setting of COPD and ongoing tobacco abuse Patient now on 2 L nasal cannula, patient with some use of accessory muscles of respiration and some diffuse wheezing this morning.  Decreased breath sounds in the left base. Urine Legionella antigen pending.  Urine pneumococcus antigen negative.  Influenza PCR negative.  Lactic acid levels trending down.  Patient on gentle hydration.  CT chest obtained 11/21/2017 with moderate left pleural effusion with associated near complete opacification of left lower lobe may be atelectasis versus pneumonia.  Dependent atelectasis in left upper lobe adjacent to pleural fluid.  Moderate emphysema.  Ultrasound-guided diagnostic and therapeutic thoracentesis was done on 11/22/2017, per interventional radiology findings concerning for empyema/loculated pleural effusion.  Patient may need bronchoscopy.  Patient has been assessed by pulmonary who feel patient's left upper quadrant tenderness may be secondary to ischemia of the splenic flexure as patient on both Midrin and Lasix on initial consultation.  On follow-up with pulmonary feel  patient does definitely likely have a parapneumonic effusion complicated by loculation  suggestive of empyema.  Thoracentesis consistent with exudative process with body fluids concerning for empyema.  Cultures pending.  Continue empiric IV vancomycin IV Zosyn, Mucinex, Pulmicort and scheduled neb treatments, Claritin, PPI.  Will place on Solu-Medrol 80 mg IV every 6 hours and add Brovana.  Cardiothoracic surgery has assessed patient and patient for VATS procedure today and probable bronchoscopy.  Pulmonary and cardiothoracic surgery following and I appreciate their input and recommendations.   2.  Sepsis secondary to healthcare associated pneumonia versus postobstructive pneumonia/exudative pleural effusion concerning for empyema/loculated pleural effusion. Patient met criteria for sepsis on admission with leukocytosis, fever, tachycardia, tachypnea, hypoxia, acute kidney injury.  Patient has been pancultured.  Influenza PCR negative.  Leukocytosis trending down.  Fever curve trending down.   CT chest done to follow-up on left pleural effusion showing a moderate pleural effusion.  Patient status post ultrasound-guided thoracentesis for diagnostic and therapeutic per IR 11/22/2017 with 480 cc of hazy yellow fluid removed.  Fluid concerning for empyema as glucose levels are less than 20, by light's criteria an exudate, 90% neutrophils noted, LDH of 1513.  Cultures from thoracentesis pending. Continue empiric IV vancomycin IV Zosyn.  Pulmonary and critical care medicine following.  Cardiothoracic surgery was consulted on 11/23/2017 and due to concerns for empyema and loculated pleural effusion patient to the OR today for bronchoscopy and VATS.  3.  Acute kidney injury Secondary to prerenal azotemia in the setting of poor oral intake and diuretic use.  Diuretics on hold.  Nephrotoxins on hold.  Renal function improved with gentle hydration.  Follow.   4.  Abdominal pain/severe iron deficiency anemia Patient presented with complaints of left upper quadrant abdominal pain in the setting of 35 pound  weight loss over the past year, decreased oral intake over the past 6-7 months, early satiety, history of Barrett's esophagus, ongoing tobacco use.  Patient still with left-sided abdominal pain.  Concern left upper abdominal pain may be secondary to moderate left exudative pleural effusion.   Patient was seen by pulmonary who feel left upper abdominal pain may be secondary to ischemia of the splenic flexure as patient noted to be both on Midrin and Lasix.   CT abdomen and pelvis with no acute abnormalities noted.  Anemia panel consistent with iron deficiency anemia.  Patient with no overt bleeding.  FOBT pending.  Gastroenterology has been consulted and feel patient's abdominal pain secondary to pulmonary process of left exudative pleural effusion.  Patient status post IV Feraheme x1.  Continue Robaxin and Toradol as needed for pain.  5.  COPD with acute exacerbation/ongoing tobacco use Patient with some wheezing diffusely today with some use of accessory muscles of respiration.  Will place on Solu-Medrol 80 mg IV every 6 hours.  Change Xopenex and Atrovent nebs to every 6 hours.  Continue Pulmicort.  Add Brovana.  Continue Claritin, PPI.  Nicotine patch.  6.  Alcohol use Patient with some agitation and confusion overnight.  Likely starting to go through alcohol withdrawal.  Status post 1 L banana bag on admission.  Continue Ativan withdrawal protocol.  Continue thiamine and folic acid.   7.  Hypokalemia/hypomagnesemia Repleted.  8.  Hyponatremia Likely secondary to volume depletion in the setting of diuretics.  Patient clinically dehydrated on examination.  Slowly improving with hydration and now fluctuating between 129 and 130.Marland Kitchen  Continue gentle hydration.  Continue to hold diuretics.   9.  History of probable chronic  hypotension Continue Midrin.  10.  ??Urinary retention versus hesitancy Continue home dose myrbetriq.  11.  Coronary artery disease/recent Takutsubo cardiomyopathy Patient  denied any crushing substernal left-sided chest pain.  2D echo from [facility] 2019 with a EF of 30-35%.  Patient states had a recent 2D echo done approximately a week ago at Dr. Thurman Coyer office will try to obtain those records.  Continue to hold diuretics.  Continue Lipitor,Toprol XL.Marland Kitchen Strict I's and O's.  Daily weights.  Plavix on hold as patient to the OR today.  12.  Chronic hypotension/??  Hypertension Continue Midrin.  Diuretics and beta-blocker on hold.  Gentle hydration pre-and postop.  Follow.   DVT prophylaxis: SCDs Code Status: Full Family Communication: Updated patient.  No family at bedside. Disposition Plan: Remain in stepdown unit today.   Consultants:   Pulmonary: Dr. Pearline Cables 11/21/2017  Cardiothoracic surgery: Dr. Roxy Manns 11/23/2017  Gastroenterology: Dr. Loletha Carrow III 11/23/2017  Procedures:   CT abdomen and pelvis 11/21/2017  CT chest 11/21/2017  Chest x-ray 11/21/2017  Ultrasound-guided thoracentesis with 480 cc of hazy yellow fluid removed per IR 11/22/2017  Antimicrobials:   IV vancomycin 11/21/2017  IV Zosyn 11/21/2017   Subjective: Patient alert likely just woke up.  Somewhat confused.  Denies chest pain.  Per nursing patient with some agitation and some confusion overnight however given some Ativan and rested all night.  Objective: Vitals:   11/24/17 0003 11/24/17 0334 11/24/17 0500 11/24/17 0728  BP: (!) 159/93 (!) 153/93  (!) 147/86  Pulse: 83 79 83 81  Resp: 17 17  (!) 21  Temp:  99 F (37.2 C)  98.4 F (36.9 C)  TempSrc:  Oral  Oral  SpO2: 99% 99%  100%  Weight:  57.4 kg (126 lb 8.7 oz)    Height:        Intake/Output Summary (Last 24 hours) at 11/24/2017 0807 Last data filed at 11/24/2017 0800 Gross per 24 hour  Intake 1133.75 ml  Output 625 ml  Net 508.75 ml   Filed Weights   11/22/17 0500 11/23/17 0526 11/24/17 0334  Weight: 57.6 kg (126 lb 15.8 oz) 60 kg (132 lb 4.4 oz) 57.4 kg (126 lb 8.7 oz)    Examination:  General exam:  Alert. Respiratory system: Decreased breath sounds in the left base.  Inspiratory and expiratory wheezing.  Some use of accessory muscles of respiration.  No crackles.  No rhonchi.  Cardiovascular system: Regular rate rhythm no murmurs rubs or gallops.  No JVD.  No bilateral lower extremity edema.  Gastrointestinal system: Abdomen is nondistended, soft, decreased tenderness to palpation left upper quadrant, positive bowel sounds.  No rebound.  No guarding.  Central nervous system: Alert. No focal neurological deficits. Extremities: Symmetric 5 x 5 power. Skin: No rashes, lesions or ulcers Psychiatry: Judgement and insight appear fair. Mood & affect appropriate.     Data Reviewed: I have personally reviewed following labs and imaging studies  CBC: Recent Labs  Lab 11/21/17 1000 11/22/17 0658 11/23/17 0226 11/24/17 0225  WBC 28.6* 19.6* 17.4* 15.7*  NEUTROABS 25.1*  --  14.3* 12.6*  HGB 10.1* 8.2* 8.8* 8.1*  HCT 29.5* 25.3* 26.5* 24.9*  MCV 93.1 94.4 94.3 92.9  PLT 758* 528* 565* 035*   Basic Metabolic Panel: Recent Labs  Lab 11/21/17 1000 11/21/17 1109 11/22/17 0658 11/23/17 0226 11/24/17 0225  NA 130*  --  128* 130* 129*  K 3.4*  --  3.6 3.9 3.8  CL 92*  --  95* 100* 101  CO2 26  --  25 21* 21*  GLUCOSE 104*  --  96 121* 90  BUN 15  --  '13 8 9  ' CREATININE 1.35*  --  0.91 0.88 0.96  CALCIUM 8.1*  --  6.8* 7.1* 7.4*  MG  --  1.0* 1.7 2.1 1.9   GFR: Estimated Creatinine Clearance: 54.8 mL/min (by C-G formula based on SCr of 0.96 mg/dL). Liver Function Tests: Recent Labs  Lab 11/21/17 1000 11/22/17 0658 11/23/17 0226  AST 40 23 27  ALT 26 15* 14*  ALKPHOS 70 56 56  BILITOT 1.0 0.6 0.8  PROT 7.2 5.2* 5.2*  ALBUMIN 2.5* 1.7* 1.6*   Recent Labs  Lab 11/21/17 1000  LIPASE 21   No results for input(s): AMMONIA in the last 168 hours. Coagulation Profile: Recent Labs  Lab 11/21/17 1109 11/23/17 1806  INR 1.23 1.17   Cardiac Enzymes: No results for  input(s): CKTOTAL, CKMB, CKMBINDEX, TROPONINI in the last 168 hours. BNP (last 3 results) No results for input(s): PROBNP in the last 8760 hours. HbA1C: No results for input(s): HGBA1C in the last 72 hours. CBG: Recent Labs  Lab 11/22/17 1230  GLUCAP 185*   Lipid Profile: No results for input(s): CHOL, HDL, LDLCALC, TRIG, CHOLHDL, LDLDIRECT in the last 72 hours. Thyroid Function Tests: No results for input(s): TSH, T4TOTAL, FREET4, T3FREE, THYROIDAB in the last 72 hours. Anemia Panel: Recent Labs    11/21/17 1839  VITAMINB12 592  FOLATE 20.4  FERRITIN 386*  TIBC 171*  IRON 9*  RETICCTPCT 1.1   Sepsis Labs: Recent Labs  Lab 11/21/17 1131 11/21/17 1328 11/21/17 1839 11/21/17 2205  LATICACIDVEN 1.44 1.17 0.9 1.5    Recent Results (from the past 240 hour(s))  Blood Culture (routine x 2)     Status: None (Preliminary result)   Collection Time: 11/21/17 11:08 AM  Result Value Ref Range Status   Specimen Description BLOOD BLOOD RIGHT FOREARM  Final   Special Requests   Final    BOTTLES DRAWN AEROBIC AND ANAEROBIC Blood Culture adequate volume   Culture   Final    NO GROWTH 2 DAYS Performed at [facility], [place] [address] [place], [place] [address]    Report Status PENDING  Incomplete  Blood Culture (routine x 2)     Status: None (Preliminary result)   Collection Time: 11/21/17 11:13 AM  Result Value Ref Range Status   Specimen Description BLOOD LEFT ANTECUBITAL  Final   Special Requests   Final    BOTTLES DRAWN AEROBIC AND ANAEROBIC Blood Culture results may not be optimal due to an inadequate volume of blood received in culture bottles   Culture   Final    NO GROWTH 2 DAYS Performed at [facility], [place] [address] [place], [place] [address]    Report Status PENDING  Incomplete  Urine culture     Status: Abnormal   Collection Time: 11/21/17 12:15 PM  Result Value Ref Range Status   Specimen Description URINE, CLEAN CATCH  Final   Special Requests  NONE  Final   Culture (A)  Final    <10,000 COLONIES/mL INSIGNIFICANT GROWTH Performed at [name], [place] [address] [place], [place] [address]    Report Status 11/22/2017 FINAL  Final  MRSA PCR Screening     Status: None   Collection Time: 11/21/17  7:37 PM  Result Value Ref Range Status   MRSA by PCR NEGATIVE NEGATIVE Final  Comment:        The GeneXpert MRSA Assay (FDA approved for NASAL specimens only), is one component of a comprehensive MRSA colonization surveillance program. It is not intended to diagnose MRSA infection nor to guide or monitor treatment for MRSA infections. Performed at [facility], [place] [address], [place], [place] [address]   Culture, body fluid-bottle     Status: None (Preliminary result)   Collection Time: 11/22/17  2:27 PM  Result Value Ref Range Status   Specimen Description PLEURAL LEFT  Final   Special Requests NONE  Final   Culture   Final    NO GROWTH < 24 HOURS Performed at [facility], [place] [address] [place], [place] [address]    Report Status PENDING  Incomplete  Gram stain     Status: None   Collection Time: 11/22/17  2:27 PM  Result Value Ref Range Status   Specimen Description PLEURAL LEFT  Final   Special Requests NONE  Final   Gram Stain   Final    RARE WBC PRESENT, PREDOMINANTLY PMN NO ORGANISMS SEEN Performed at [facility], [place] [address] [place], [place] [address]    Report Status 11/22/2017 FINAL  Final  Surgical pcr screen     Status: None   Collection Time: 11/23/17  8:28 PM  Result Value Ref Range Status   MRSA, PCR NEGATIVE NEGATIVE Final   Staphylococcus aureus NEGATIVE NEGATIVE Final    Comment: (NOTE) The Xpert SA Assay (FDA approved for NASAL specimens in patients 65 years of age and older), is one component of a comprehensive surveillance program. It is not intended to diagnose infection nor to guide or monitor treatment. Performed at [facility], [place] [address],  [place], [place] [address]          Radiology Studies: Dg Chest 1 View  Result Date: 11/22/2017 CLINICAL DATA:  Thoracentesis EXAM: CHEST  1 VIEW COMPARISON:  11/21/2017 FINDINGS: There is no pneumothorax post left thoracentesis. Airspace disease throughout the left lung is stable. Opacity at the left base is stable. Right basilar airspace disease increased. Cardiomegaly. IMPRESSION: No pneumothorax post left thoracentesis. Electronically Signed   By: Marybelle Killings M.D.   On: 11/22/2017 14:56   Dg Chest 2 View  Result Date: 11/23/2017 CLINICAL DATA:  Follow-up left pneumonia EXAM: CHEST - 2 VIEW COMPARISON:  11/22/2017 FINDINGS: Cardiac shadow is stable. The right lung remains clear. Increasing left-sided pleural effusion is noted with left basilar infiltrate. Rounded density is noted likely related to fluid within the fissure. No bony abnormality is noted. IMPRESSION: Increasing left-sided pleural effusion Electronically Signed   By: Inez Catalina M.D.   On: 11/23/2017 17:57   US Thoracentesis Asp Pleural Space W/img Guide  Result Date: 11/22/2017 INDICATION: Acute respiratory failure with hypoxia secondary to healthcare associated pneumonia and loculated pleural effusion. Request for diagnostic and therapeutic thoracentesis. EXAM: ULTRASOUND GUIDED LEFT THORACENTESIS MEDICATIONS: 1% Lidocaine = 12 mL COMPLICATIONS: None immediate. PROCEDURE: An ultrasound guided thoracentesis was thoroughly discussed with the patient and questions answered. The benefits, risks, alternatives and complications were also discussed. The patient understands and wishes to proceed with the procedure. Written consent was obtained. Ultrasound was performed to localize and mark an adequate pocket of fluid in the left chest. The area was then prepped and draped in the normal sterile fashion. 1% Lidocaine was used for local anesthesia. Under ultrasound guidance a 6 Fr Safe-T-Centesis catheter was introduced. Thoracentesis was  performed. The catheter was removed and a dressing applied. FINDINGS: A total of approximately 480 mL of hazy yellow fluid was removed. Samples were sent to the laboratory as requested by the clinical team. IMPRESSION: Successful ultrasound guided left thoracentesis yielding 480 mL of pleural fluid. No pneumothorax on post procedure chest X-ray. Read by: Gareth Eagle, PA-C Electronically Signed   By: Markus Daft M.D.   On: 11/22/2017 15:01        Scheduled Meds: . [MAR Hold] arformoterol  15 mcg Nebulization BID  . [MAR Hold] atorvastatin  80 mg Oral Daily  . [MAR Hold] budesonide (PULMICORT) nebulizer solution  0.5 mg Nebulization BID  . [MAR Hold] DULoxetine  60 mg Oral Daily  . [MAR Hold] feeding supplement  1 Container Oral TID BM  . [MAR Hold] folic acid  1 mg Oral Daily  . [MAR Hold] guaiFENesin  1,200 mg Oral BID  . [MAR Hold] ipratropium  0.5 mg Nebulization Q6H  . [MAR Hold] levalbuterol  0.63 mg Nebulization Q6H  . [MAR Hold] loratadine  10 mg Oral Daily  . [MAR Hold] LORazepam  0-4 mg Intravenous Q6H   Followed by  . [MAR Hold] LORazepam  0-4 mg Intravenous Q12H  . [MAR Hold] mouth rinse  15 mL Mouth Rinse BID  . [MAR Hold] methylPREDNISolone (SOLU-MEDROL) injection  80 mg Intravenous Q6H  . [MAR Hold] metoprolol succinate  50 mg Oral Q breakfast  . [MAR Hold] midodrine  2.5 mg Oral TID WC  . [MAR Hold] mirabegron ER  50 mg Oral Daily  . [MAR Hold] multivitamin with minerals  1 tablet Oral Daily  . [MAR Hold] nicotine  21 mg Transdermal Daily  . [MAR Hold] pantoprazole  40 mg Oral Q0600  . [MAR Hold] QUEtiapine  25 mg Oral QHS  . [MAR Hold] thiamine  100 mg Oral Daily   Or  . [MAR Hold] thiamine  100 mg Intravenous Daily   Continuous Infusions: . sodium chloride 75 mL (11/24/17 0536)  . [MAR Hold] piperacillin-tazobactam (ZOSYN)  IV Stopped (11/24/17 0513)  . [MAR Hold] vancomycin Stopped (11/24/17 0117)     LOS: 3 days    Time spent: 40 minutes    Irine Seal, MD [facility] Pager [phone]  If 7PM-7AM, please contact night-coverage [email or web address] Password [facility] 11/24/2017, 8:07 AM

## 2017-11-24 NOTE — Progress Notes (Signed)
TCTS  S/p Left VATS for empyema intraop bleeding req transfusion Stable on vent Chest tubes about 100cc/hrcheck CBC this pm

## 2017-11-24 NOTE — Anesthesia Procedure Notes (Signed)
Procedure Name: Intubation Date/Time: 11/24/2017 12:34 PM Performed by: Inda Coke, CRNA Pre-anesthesia Checklist: Patient identified, Emergency Drugs available, Suction available and Patient being monitored Patient Re-evaluated:Patient Re-evaluated prior to induction Oxygen Delivery Method: Circle System Utilized Preoxygenation: Pre-oxygenation with 100% oxygen Induction Type: Inhalational induction Ventilation: Mask ventilation without difficulty Laryngoscope Size: Miller and 2 Tube type: Oral Endobronchial tube: Left, Double lumen EBT, EBT position confirmed by auscultation and EBT position confirmed by fiberoptic bronchoscope and 37 Fr Number of attempts: 1 Airway Equipment and Method: Bougie stylet Placement Confirmation: ETT inserted through vocal cords under direct vision,  positive ETCO2 and breath sounds checked- equal and bilateral Tube secured with: Tape Dental Injury: Teeth and Oropharynx as per pre-operative assessment  Difficulty Due To: Difficult Airway- due to reduced neck mobility and Difficult Airway- due to anterior larynx Future Recommendations: Recommend- induction with short-acting agent, and alternative techniques readily available Comments: ETT removed over cook catheter and DL X 1 and attempt to advance DLT over cook catheter unsuccessful. Glidescope MAC 4 blade DL X 2 with attempt to pass DLT over cook catheter unsuccessful. Reinsert 8.5 ETT over cook catheter with success and ventilating pt. DL X 3 with Miller 2, removed ETT over cook catheter and attempt to pass DLT over cook catheter with success.

## 2017-11-24 NOTE — Progress Notes (Signed)
Patient's watch and partials sent with Delana Meyer, Network engineer from patient's floor.

## 2017-11-24 NOTE — Progress Notes (Addendum)
      [address]       [place] [address]             [phone]     Pre Procedure note for inpatients:   Brad Turner has been scheduled for Procedure(s): VIDEO ASSISTED THORACOSCOPY (VATS)/EMPYEMA (Left) VIDEO BRONCHOSCOPY (N/A) today. The various methods of treatment have been discussed with the patient. After consideration of the risks, benefits and treatment options the patient has consented to the planned procedure.   The patient has been seen and labs reviewed. Patients continued respiratory  distress with empyema needs  proceeding with drainage, today , unlikely to be able to be extubated post op, CCM following, asked by Dr Roxy Manns to proceed with surgery to prevent delays . Patient confused, Dr Roxy Manns has discussed plans with family and optained consent .   Recent labs:  Lab Results  Component Value Date   WBC 15.7 (H) 11/24/2017   HGB 8.1 (L) 11/24/2017   HCT 24.9 (L) 11/24/2017   PLT 578 (H) 11/24/2017   GLUCOSE 90 11/24/2017   CHOL 131 09/14/2017   TRIG 43 09/14/2017   HDL 84 09/14/2017   LDLCALC 38 09/14/2017   ALT 14 (L) 11/23/2017   AST 27 11/23/2017   NA 129 (L) 11/24/2017   K 3.8 11/24/2017   CL 101 11/24/2017   CREATININE 0.96 11/24/2017   BUN 9 11/24/2017   CO2 21 (L) 11/24/2017   TSH 2.282 09/21/2017   INR 1.17 11/23/2017    Brad Isaac, MD 11/24/2017 11:24 AM

## 2017-11-24 NOTE — Consult Note (Signed)
PULMONARY / CRITICAL CARE MEDICINE   Name: Brad Turner MRN: [id number] DOB: [birth year]    ADMISSION DATE:  11/21/2017 CONSULTATION DATE:  11/23/2017  REFERRING MD:  Grandville Silos  CHIEF COMPLAINT:  Shortness of breath  HISTORY OF PRESENT ILLNESS:   77 y/o male with a history of COPD, EtOH use, CAD and GERD here with a large left complicated pleural effusion who went for a VATS drainage 3/25.     SUBJECTIVE:  Taken to OR today for drainage, PCCM consulted for assistance with ventilator management.   VITAL SIGNS: BP (!) 147/86 (BP Location: Right Arm)   Pulse 81   Temp 98.4 F (36.9 C) (Oral)   Resp (!) 21   Ht 5\' 5"  (1.651 m)   Wt 126 lb 8.7 oz (57.4 kg)   SpO2 100%   BMI 21.06 kg/m   HEMODYNAMICS:    VENTILATOR SETTINGS: FiO2 (%):  [98 %] 98 %  INTAKE / OUTPUT: I/O last 3 completed shifts: In: 2588.8 [P.O.:1080; I.V.:1508.8] Out: 900 [Urine:900]  PHYSICAL EXAMINATION: General:  Elderly male on vent Neuro:  Sedated HEENT:  [place]/AT, No JVD noted, PERRL Cardiovascular:  RRR, no MRG Lungs: Clear bilateral breath sounds Abdomen:  Soft, non-distended Musculoskeletal:  No acute deformity Skin:  Intact, MMM  LABS:  BMET Recent Labs  Lab 11/22/17 0658 11/23/17 0226 11/24/17 0225  NA 128* 130* 129*  K 3.6 3.9 3.8  CL 95* 100* 101  CO2 25 21* 21*  BUN 13 8 9   CREATININE 0.91 0.88 0.96  GLUCOSE 96 121* 90    Electrolytes Recent Labs  Lab 11/22/17 0658 11/23/17 0226 11/24/17 0225  CALCIUM 6.8* 7.1* 7.4*  MG 1.7 2.1 1.9    CBC Recent Labs  Lab 11/22/17 0658 11/23/17 0226 11/24/17 0225  WBC 19.6* 17.4* 15.7*  HGB 8.2* 8.8* 8.1*  HCT 25.3* 26.5* 24.9*  PLT 528* 565* 578*    Coag's Recent Labs  Lab 11/21/17 1109 11/23/17 1806  APTT  --  44*  INR 1.23 1.17    Sepsis Markers Recent Labs  Lab 11/21/17 1328 11/21/17 1839 11/21/17 2205  LATICACIDVEN 1.17 0.9 1.5    ABG Recent Labs  Lab 11/23/17 1845  PHART 7.424  PCO2ART 35.2   PO2ART 46.1*    Liver Enzymes Recent Labs  Lab 11/21/17 1000 11/22/17 0658 11/23/17 0226  AST 40 23 27  ALT 26 15* 14*  ALKPHOS 70 56 56  BILITOT 1.0 0.6 0.8  ALBUMIN 2.5* 1.7* 1.6*    Cardiac Enzymes No results for input(s): TROPONINI, PROBNP in the last 168 hours.  Glucose Recent Labs  Lab 11/22/17 1230  GLUCAP 185*    Imaging No results found.   STUDIES:  09/2017 Echo>  3/23 CT chest > emphysema, large left pleural effusion 3/23 left pleural fluid> WBC 2375, 90% PMN   CULTURES: 3/25 pleural bact >  3/25 pleural AFB >  3/25 pleural fung >  3/23 pleural >  3/22 blood >  3/22 urine >   ANTIBIOTICS: 3/22 zosyn >  3/22 vanc >   SIGNIFICANT EVENTS:   LINES/TUBES: 3/25 ETT >  3/25 Left chest tube >   DISCUSSION: 77 y/o male with empyema, now s/p VATS drainage.  At baseline he has multiple medical problems including cardiomyopathy, COPD, and CAD.    ASSESSMENT / PLAN:  PULMONARY A: Acute respiratory failure with hypoxemia HCAP Empyema s/p VATS drainage 3/25 COPD P:   Full vent support Chest tube per CVTS ABG CXR VAP bundle  CARDIOVASCULAR A:  Baseline cardiomyopathy, chronic systolic heart failure Chronic hypotension  P:  Tele monitoring Phenylephrine for MAP goal Holding midodrine for now.   RENAL A:   AKI: improving P:   Monitor BMET and UOP Replace electrolytes as needed  GASTROINTESTINAL A:   No acute issues P:   Pantoprazole for stress ulcer prophylaxis Start tube feeds in AM if unable to come off the vent.   HEMATOLOGIC A: Anemia without bleeding P:  Monitor for bleeding  INFECTIOUS A:   Empyema P:   Continue broad spectrum antibiotics. Chest tube drainage.   ENDOCRINE A:   No acute issues P:   Monitor glucose.  NEUROLOGIC A:   Need for sedation for vent synchrony P:   RASS goal: -1. PAD protocol Propofol, PRN fentanyl . Daily WUA.  FAMILY  - Updates:   - Inter-disciplinary family meet or  Palliative Care meeting due by:  4/2    Georgann Housekeeper, AGACNP-BC [facility] Pager [phone] or [phone]  11/24/2017 6:58 PM

## 2017-11-24 NOTE — Anesthesia Postprocedure Evaluation (Signed)
Anesthesia Post Note  Patient: Keni Elison Mcree  Procedure(s) Performed: VIDEO ASSISTED THORACOSCOPY (VATS)/EMPYEMA (Left Chest) VIDEO BRONCHOSCOPY (N/A ) CHEST EXPLORATION (Left Chest)     Patient location during evaluation: SICU Anesthesia Type: General Level of consciousness: sedated and patient remains intubated per anesthesia plan Pain management: pain level controlled Vital Signs Assessment: post-procedure vital signs reviewed and stable Respiratory status: patient remains intubated per anesthesia plan and patient on ventilator - see flowsheet for VS Cardiovascular status: stable Postop Assessment: no apparent nausea or vomiting Anesthetic complications: no    Last Vitals:  Vitals:   11/24/17 1900 11/24/17 1930  BP: (!) 144/90   Pulse: 61   Resp: 18   Temp:  (!) 36.3 C  SpO2:      Last Pain:  Vitals:   11/24/17 1930  TempSrc: Oral  PainSc:                  Kevin Mario COKER

## 2017-11-24 NOTE — Anesthesia Procedure Notes (Signed)
Procedure Name: Intubation Date/Time: 11/24/2017 2:32 PM Performed by: Inda Coke, CRNA Pre-anesthesia Checklist: Patient identified, Emergency Drugs available, Suction available and Patient being monitored Patient Re-evaluated:Patient Re-evaluated prior to induction Oxygen Delivery Method: Circle System Utilized Preoxygenation: Pre-oxygenation with 100% oxygen Induction Type: IV induction Ventilation: Mask ventilation without difficulty Laryngoscope Size: Glidescope and 3 Grade View: Grade I Tube type: Subglottic suction tube Tube size: 8.5 mm Number of attempts: 1 Airway Equipment and Method: Stylet and Oral airway Placement Confirmation: ETT inserted through vocal cords under direct vision,  positive ETCO2 and breath sounds checked- equal and bilateral Secured at: 21 cm Tube secured with: Tape Dental Injury: Teeth and Oropharynx as per pre-operative assessment  Comments: DLT exchange to subglottic ETT with cook catheter and glidescope smooth exchange

## 2017-11-24 NOTE — Progress Notes (Signed)
Transported to short stay by stretcher awake  And alert. Nicotine parch removed from the right arm.report given to RN by night shift nurse. Md aware to call family after surgery.

## 2017-11-24 NOTE — Anesthesia Procedure Notes (Signed)
Procedure Name: Intubation Date/Time: 11/24/2017 12:06 PM Performed by: Inda Coke, CRNA Pre-anesthesia Checklist: Patient identified, Emergency Drugs available, Suction available and Patient being monitored Patient Re-evaluated:Patient Re-evaluated prior to induction Oxygen Delivery Method: Circle System Utilized Preoxygenation: Pre-oxygenation with 100% oxygen Induction Type: IV induction Ventilation: Mask ventilation without difficulty Laryngoscope Size: Mac and 4 Grade View: Grade III Tube type: Oral Tube size: 8.5 mm Number of attempts: 1 Airway Equipment and Method: Stylet and Oral airway Placement Confirmation: ETT inserted through vocal cords under direct vision,  positive ETCO2 and breath sounds checked- equal and bilateral Secured at: 22 cm Tube secured with: Tape Dental Injury: Teeth and Oropharynx as per pre-operative assessment

## 2017-11-25 ENCOUNTER — Encounter (HOSPITAL_COMMUNITY): Payer: Self-pay | Admitting: *Deleted

## 2017-11-25 ENCOUNTER — Inpatient Hospital Stay (HOSPITAL_COMMUNITY): Payer: Medicare Other

## 2017-11-25 ENCOUNTER — Telehealth: Payer: Self-pay | Admitting: Gastroenterology

## 2017-11-25 LAB — BPAM FFP
BLOOD PRODUCT EXPIRATION DATE: 201903292359
BLOOD PRODUCT EXPIRATION DATE: 201903302359
Blood Product Expiration Date: 201903302359
ISSUE DATE / TIME: 201903252128
ISSUE DATE / TIME: 201903251518
ISSUE DATE / TIME: 201903251518
UNIT TYPE AND RH: 5100
Unit Type and Rh: 6200
Unit Type and Rh: 6200

## 2017-11-25 LAB — PREPARE FRESH FROZEN PLASMA
UNIT DIVISION: 0
UNIT DIVISION: 0
Unit division: 0

## 2017-11-25 LAB — CBC
HCT: 24.1 % — ABNORMAL LOW (ref 39.0–52.0)
Hemoglobin: 8.5 g/dL — ABNORMAL LOW (ref 13.0–17.0)
MCH: 29.7 pg (ref 26.0–34.0)
MCHC: 35.3 g/dL (ref 30.0–36.0)
MCV: 84.3 fL (ref 78.0–100.0)
Platelets: 221 K/uL (ref 150–400)
RBC: 2.86 MIL/uL — ABNORMAL LOW (ref 4.22–5.81)
RDW: 16 % — ABNORMAL HIGH (ref 11.5–15.5)
WBC: 13.8 K/uL — ABNORMAL HIGH (ref 4.0–10.5)

## 2017-11-25 LAB — BLOOD GAS, ARTERIAL
Acid-base deficit: 4.9 mmol/L — ABNORMAL HIGH (ref 0.0–2.0)
Bicarbonate: 19.5 mmol/L — ABNORMAL LOW (ref 20.0–28.0)
FIO2: 30
MECHVT: 490 mL
O2 Saturation: 98.1 %
PEEP: 5 cmH2O
Patient temperature: 98.6
RATE: 18 {breaths}/min
pCO2 arterial: 35.1 mmHg (ref 32.0–48.0)
pH, Arterial: 7.363 (ref 7.350–7.450)
pO2, Arterial: 116 mmHg — ABNORMAL HIGH (ref 83.0–108.0)

## 2017-11-25 LAB — POCT I-STAT 7, (LYTES, BLD GAS, ICA,H+H)
Acid-base deficit: 3 mmol/L — ABNORMAL HIGH (ref 0.0–2.0)
Bicarbonate: 22.6 mmol/L (ref 20.0–28.0)
CALCIUM ION: 1.04 mmol/L — AB (ref 1.15–1.40)
HEMATOCRIT: 27 % — AB (ref 39.0–52.0)
Hemoglobin: 9.2 g/dL — ABNORMAL LOW (ref 13.0–17.0)
O2 Saturation: 100 %
POTASSIUM: 4.3 mmol/L (ref 3.5–5.1)
SODIUM: 133 mmol/L — AB (ref 135–145)
TCO2: 24 mmol/L (ref 22–32)
pCO2 arterial: 37.9 mmHg (ref 32.0–48.0)
pH, Arterial: 7.378 (ref 7.350–7.450)
pO2, Arterial: 354 mmHg — ABNORMAL HIGH (ref 83.0–108.0)

## 2017-11-25 LAB — GLUCOSE, CAPILLARY
GLUCOSE-CAPILLARY: 100 mg/dL — AB (ref 65–99)
Glucose-Capillary: 101 mg/dL — ABNORMAL HIGH (ref 65–99)
Glucose-Capillary: 91 mg/dL (ref 65–99)
Glucose-Capillary: 86 mg/dL (ref 65–99)

## 2017-11-25 LAB — BASIC METABOLIC PANEL WITH GFR
Anion gap: 8 (ref 5–15)
BUN: 11 mg/dL (ref 6–20)
CO2: 20 mmol/L — ABNORMAL LOW (ref 22–32)
Calcium: 8.4 mg/dL — ABNORMAL LOW (ref 8.9–10.3)
Chloride: 106 mmol/L (ref 101–111)
Creatinine, Ser: 0.98 mg/dL (ref 0.61–1.24)
GFR calc Af Amer: >60 mL/min
GFR calc non Af Amer: >60 mL/min
Glucose, Bld: 116 mg/dL — ABNORMAL HIGH (ref 65–99)
Potassium: 4.1 mmol/L (ref 3.5–5.1)
Sodium: 134 mmol/L — ABNORMAL LOW (ref 135–145)

## 2017-11-25 MED ORDER — ORAL CARE MOUTH RINSE
15.0000 mL | OROMUCOSAL | Status: DC
  Administered 2017-11-25 (×3): 15 mL via OROMUCOSAL

## 2017-11-25 MED ORDER — SODIUM CHLORIDE 0.9 % IV SOLN
0.0000 ug/min | INTRAVENOUS | Status: DC
  Filled 2017-11-25: qty 1

## 2017-11-25 MED ORDER — CALCIUM CHLORIDE 10 % IV SOLN
1.0000 g | Freq: Once | INTRAVENOUS | Status: AC
  Administered 2017-11-25: 1 g via INTRAVENOUS

## 2017-11-25 MED ORDER — FENTANYL CITRATE (PF) 100 MCG/2ML IJ SOLN
25.0000 ug | INTRAMUSCULAR | Status: DC | PRN
  Administered 2017-11-25 (×2): 25 ug via INTRAVENOUS
  Administered 2017-11-26: 50 ug via INTRAVENOUS
  Administered 2017-11-26 (×2): 100 ug via INTRAVENOUS
  Administered 2017-11-26: 50 ug via INTRAVENOUS
  Administered 2017-11-26: 100 ug via INTRAVENOUS
  Filled 2017-11-25 (×6): qty 2

## 2017-11-25 MED ORDER — ALBUMIN HUMAN 5 % IV SOLN
INTRAVENOUS | Status: AC
  Filled 2017-11-25: qty 250

## 2017-11-25 MED ORDER — ALBUMIN HUMAN 5 % IV SOLN
12.5000 g | Freq: Once | INTRAVENOUS | Status: AC
  Administered 2017-11-25: 12.5 g via INTRAVENOUS

## 2017-11-25 MED FILL — Thrombin (Recombinant) For Soln 5000 Unit: CUTANEOUS | Qty: 5000 | Status: AC

## 2017-11-25 NOTE — Progress Notes (Signed)
Patient found pulling on foley and now urinating all around the catheter.  Foley catheter discontinued and will monitor patient to make sure he can urinate on his own.

## 2017-11-25 NOTE — Progress Notes (Signed)
Pt extubated to 4L N/C.  No stridor noted.  RN @ bedside.

## 2017-11-25 NOTE — Progress Notes (Signed)
Spoke with Dr. Prescott Gum after patient's blood pressure and urine ouput dropped. Orders for albumin and calcium chloride x 1 and neosynephrine gtt if first intervention unsuccessful. Orders initiated. Will continue to monitor.

## 2017-11-25 NOTE — Op Note (Signed)
Brad Turner, Brad Turner NO.:  [id number]  MEDICAL RECORD NO.:  [id number]  LOCATION:  2C08C                        FACILITY:  [facility]  PHYSICIAN:  Lanelle Bal, MD    DATE OF BIRTH:  1940-10-29  DATE OF PROCEDURE:  11/24/2017 DATE OF DISCHARGE:                              OPERATIVE REPORT   PREOPERATIVE DIAGNOSIS:  Left empyema with sepsis and respiratory failure.  POSTOPERATIVE DIAGNOSIS:  Left empyema with sepsis and respiratory failure.  SURGICAL PROCEDURE:  Bronchoscopy, left video-assisted thoracoscopy, mini thoracotomy, and decortication.  SURGEON:  Lanelle Bal, MD.  FIRST ASSISTANT:  Lars Pinks, PA.  BRIEF HISTORY:  The patient is a 77 year old male who was seen by Dr. Roxy Manns at the request of the Medical Service after he was admitted with pneumonia and left effusion on CT scan consistent with empyema.  The patient had undergone a thoracentesis that was unsuccessful and his condition continued to deteriorate.  At the time of surgery, he appeared very frail, tachycardic, in moderate respiratory distress.  Central line and arterial lines were placed by Dr. Linna Caprice, and we proceeded with evacuation of the left empyema.  The left chest was marked preoperatively.  DESCRIPTION OF PROCEDURE:  The patient underwent general endotracheal anesthesia first with a single-lumen endotracheal tube through this tube.  An appropriate time-out was performed.  We then proceeded with bronchoscopy to the subsegmental level.  There were no endobronchial lesions appreciated.  Secretions were suctioned and removed, sent for cultures.  A double-lumen endotracheal tube was then placed by Dr. Linna Caprice.  This was with some difficulty getting it positioned, but ultimately the tube was in good position and the patient was turned in lateral decubitus position with the left side up.  The left chest was prepped with Betadine and draped in a sterile manner.  The  preoperative marking was visible.  Second time-out was performed and we proceeded with a small incision over the left lower chest in the midaxillary line. __________ dense adhesions consistent with empyema.  The incision was enlarged and we then proceeded with removal of as much pleural fluid and proteinaceous debris as possible.  The patient had significant peels which were removed.  At the beginning of surgery, the patient's hematocrit was 24.  Before we started, we had decided to transfuse him. The inflammatory process in the chest wall was bloody.  Using electrocautery, bleeding points were controlled.  With the empyema cavity cleared as much as possible, two Blake drains were left in place, brought out through our 2 port sites and positioned.  Incision was then closed with interrupted 0 Vicryl, running 3-0 Vicryl in subcutaneous tissue.  Dry dressings were applied.  The patient was then turned back to a lateral position to change his endotracheal tube back to a single- lumen tube.  During the process of changing to a single-lumen tube, the patient had approximately 500 mL of bloody drainage from his tubes. This was considering the situation thought to be somewhat excessive and we decided it would be best to turn the patient back on his side and re- explore the operative field.  The left chest was again prepped with Betadine and draped in  sterile manner.  A second time-out was performed. The previous incision was reopened and further hematoma was evacuated from the chest.  Again, the chest cavity was irrigated and we spent more time both with electrocautery and placement of __________ anticoagulation materials along the chest wall to decrease bleeding. With this, the patient was also given 2 units of fresh frozen.  His platelet count, fibrinogen, and ProTime were all adequate.  We ultimately gave 2 fresh frozen and 4 units of packed cells with a hematocrit of 30 and hemoglobin of 11.   When we were satisfied that the operative field was as hemostatic as we could make it, two 32 Blake drains were left in place and secured.  The lung was inflated and the incision closed with interrupted 0 Vicryl, running 2-0 Vicryl in subcutaneous tissue, and a 3-0 subcuticular stitch in the skin edges. Dry dressings were applied.  Sponge and needle counts were reported as correct, and a chest x-ray was obtained in the OR to confirm the instrument count.  There were no foreign bodies noted on the x-ray.  The patient was then transferred intubated directly to the Surgical Intensive Care Unit.  This was the original plan at the beginning of the case as the patient's respiratory status appeared too poor to even attempt extubation at the completion of the procedure.  Estimated blood loss was hard to estimate with the amount of pleural fluid that was also present, but approximately 1000 mL is estimated.     Lanelle Bal, MD     EG/MEDQ  D:  11/25/2017  T:  11/25/2017  Job:  [id number]

## 2017-11-25 NOTE — Telephone Encounter (Signed)
-----   Message from Doristine Counter, RN sent at 11/24/2017  8:34 AM EDT ----- Can you please make a follow up appointment and let patient know. Thank you! Almyra Free ----- Message ----- From: Loralie Champagne, PA-C Sent: 11/23/2017  11:10 AM To: Doristine Counter, RN  Can you please schedule this patient for an appt with Dr. Havery Moros in about 4-6 weeks?  Patient will need to be made aware of the appt.  He was seen this weekend in the hospital as a consult but does not need further GI services currently so will just need to follow up.  Thank you,  Jess

## 2017-11-25 NOTE — Progress Notes (Addendum)
Patient ID: Brad Turner, male   DOB: Apr 26, 1941, 77 y.o.   MRN: [id number] TCTS DAILY ICU PROGRESS NOTE                   [address]            [place] [address]          [phone]   1 [place] Post-Op Procedure(s) (LRB): VIDEO ASSISTED THORACOSCOPY (VATS)/EMPYEMA (Left) VIDEO BRONCHOSCOPY (N/A) CHEST EXPLORATION (Left)  Total Length of Stay:  LOS: 4 days   Subjective: Patient sedated on ventilator  Objective: Vital signs in last 24 hours: Temp:  [97.4 F (36.3 C)-97.7 F (36.5 C)] 97.7 F (36.5 C) (03/26 0354) Pulse Rate:  [61-81] 81 (03/26 0700) Cardiac Rhythm: Normal sinus rhythm (03/26 0400) Resp:  [0-18] 18 (03/26 0700) BP: (86-156)/(64-90) 107/68 (03/26 0700) SpO2:  [99 %-100 %] 99 % (03/26 0400) Arterial Line BP: (99-159)/(51-85) 114/51 (03/26 0700) FiO2 (%):  [30 %-50 %] 30 % (03/26 0400) Weight:  [126 lb 8.7 oz (57.4 kg)-147 lb 0.8 oz (66.7 kg)] 147 lb 0.8 oz (66.7 kg) (03/26 0326)  Filed Weights   11/24/17 0334 11/24/17 0812 11/25/17 0326  Weight: 126 lb 8.7 oz (57.4 kg) 126 lb 8.7 oz (57.4 kg) 147 lb 0.8 oz (66.7 kg)    Weight change: 0 lb (0 kg)   Hemodynamic parameters for last 24 hours:    Intake/Output from previous [facility]: 03/25 0701 - 03/26 0700 In: 7358.2 [I.V.:3597.9; Blood:2010.3; NG/GT:150; IV Piggyback:1600] Out: 9798 [Urine:570; Emesis/NG output:350; Blood:1250; Chest Tube:860]  Intake/Output this shift: No intake/output data recorded.  Current Meds: Scheduled Meds: . acetaminophen  1,000 mg Oral Q6H   Or  . acetaminophen (TYLENOL) oral liquid 160 mg/5 mL  1,000 mg Oral Q6H  . arformoterol  15 mcg Nebulization BID  . atorvastatin  80 mg Oral Daily  . bisacodyl  10 mg Oral Daily  . budesonide (PULMICORT) nebulizer solution  0.5 mg Nebulization BID  . chlorhexidine gluconate (MEDLINE KIT)  15 mL Mouth Rinse BID  . Chlorhexidine Gluconate Cloth  6 each Topical Daily  . DULoxetine  60 mg Oral Daily  . feeding supplement   1 Container Oral TID BM  . guaiFENesin  1,200 mg Oral BID  . insulin aspart  0-24 Units Subcutaneous Q6H  . ipratropium  0.5 mg Nebulization Q6H  . levalbuterol  0.63 mg Nebulization Q6H  . loratadine  10 mg Oral Daily  . mouth rinse  15 mL Mouth Rinse QID  . mirabegron ER  50 mg Oral Daily  . nicotine  21 mg Transdermal Daily  . pantoprazole  40 mg Oral Q0600  . QUEtiapine  25 mg Oral QHS  . senna-docusate  1 tablet Oral QHS  . sodium chloride flush  10-40 mL Intracatheter Q12H   Continuous Infusions: . 0.9 % NaCl with KCl 20 mEq / L 100 mL/hr at 11/24/17 2000  . phenylephrine (NEO-SYNEPHRINE) Adult infusion    . piperacillin-tazobactam (ZOSYN)  IV 3.375 g (11/25/17 0243)  . potassium chloride    . propofol (DIPRIVAN) infusion 25 mcg/kg/min (11/25/17 0734)  . vancomycin Stopped (11/25/17 0207)   PRN Meds:.fentaNYL (SUBLIMAZE) injection, fentaNYL (SUBLIMAZE) injection, loperamide, ondansetron (ZOFRAN) IV, potassium chloride, sodium chloride flush, sorbitol  General appearance: Patient sedated on ventilator Neurologic: Sedated Heart: regular rate and rhythm, S1, S2 normal, no murmur, click, rub or gallop Lungs: diminished breath sounds bilaterally Abdomen: soft, non-tender; bowel sounds normal; no masses,  no organomegaly Extremities: extremities normal, atraumatic, no cyanosis or edema and Homans sign is negative, no sign of DVT Wound: Chest tubes intact without air leak  Lab Results: CBC: Recent Labs    11/24/17 2239 11/25/17 0345  WBC 13.2* 13.8*  HGB 9.3* 8.5*  HCT 26.8* 24.1*  PLT 221 221   BMET:  Recent Labs    11/24/17 1807 11/25/17 0345  NA 131* 134*  K 4.0 4.1  CL 105 106  CO2 19* 20*  GLUCOSE 103* 116*  BUN 9 11  CREATININE 0.87 0.98  CALCIUM 6.8* 8.4*    CMET: Lab Results  Component Value Date   WBC 13.8 (H) 11/25/2017   HGB 8.5 (L) 11/25/2017   HCT 24.1 (L) 11/25/2017   PLT 221 11/25/2017   GLUCOSE 116 (H) 11/25/2017   CHOL 131 09/14/2017    TRIG 76 11/24/2017   HDL 84 09/14/2017   LDLCALC 38 09/14/2017   ALT 14 (L) 11/23/2017   AST 27 11/23/2017   NA 134 (L) 11/25/2017   K 4.1 11/25/2017   CL 106 11/25/2017   CREATININE 0.98 11/25/2017   BUN 11 11/25/2017   CO2 20 (L) 11/25/2017   TSH 2.282 09/21/2017   INR 1.40 11/24/2017      PT/INR:  Recent Labs    11/24/17 1807  LABPROT 17.0*  INR 1.40   Radiology: Dg Chest Portable 1 View  Result Date: 11/24/2017 CLINICAL DATA:  Postop EXAM: PORTABLE CHEST 1 VIEW COMPARISON:  Portable exam 1700 hours compared to 11/23/2017 FINDINGS: Tips of lung apices excluded. Tip of endotracheal tube projects approximately 4.0 cm above carina, carina poorly visualized. Tip of RIGHT jugular central venous catheter projects over SVC. Pair of LEFT thoracostomy tubes. Normal heart size, mediastinal contours, and pulmonary vascularity. Atherosclerotic calcification aorta. LEFT basilar atelectasis. Scattered subsegmental atelectasis and mid and lower RIGHT lung. No definite pneumothorax. Minimal LEFT pleural effusion again seen. EKG leads project over chest. Retained contrast in upper abdomen. IMPRESSION: No pneumothorax following central line and LEFT thoracostomy tube placement. Persistent LEFT basilar atelectasis and small LEFT pleural effusion. Scattered subsegmental atelectasis RIGHT lung. No definite retained radiopaque foreign bodies visualized. Electronically Signed   By: Lavonia Dana M.D.   On: 11/24/2017 17:23     Assessment/Plan: S/P Procedure(s) (LRB): VIDEO ASSISTED THORACOSCOPY (VATS)/EMPYEMA (Left) VIDEO BRONCHOSCOPY (N/A) CHEST EXPLORATION (Left) Chest tube drainage decreased, chest x-ray adequate Expected Acute  Blood - loss Anemia- continue to monitor  Creatinine stable currently Would leave chest tubes in place for now Further care by critical care and medical service With patient's preop Plavix administration, and risk of bleeding into the left chest would avoid any form of  anticoagulation at this point  [name] 11/25/2017 8:00 AM

## 2017-11-25 NOTE — Progress Notes (Signed)
      [address]       [place] [address]             [phone]      POD # 1 decortication  Awake, on vent- to be extubated  BP (!) 153/92   Pulse 98   Temp 97.6 F (36.4 C) (Oral)   Resp 17   Ht 5\' 5"  (1.651 m)   Wt 147 lb 0.8 oz (66.7 kg)   SpO2 98%   BMI 24.47 kg/m   Intake/Output Summary (Last 24 hours) at 11/25/2017 1838 Last data filed at 11/25/2017 1800 Gross per 24 hour  Intake 4365.8 ml  Output 1605 ml  Net 2760.8 ml    Extubate this evening  Remo Lipps C. Roxan Hockey, MD [facility] [phone]

## 2017-11-25 NOTE — Progress Notes (Signed)
PULMONARY / CRITICAL CARE MEDICINE   Name: Ona Rathert [place] MRN: [id number] DOB: [birth year]    ADMISSION DATE:  11/21/2017 CONSULTATION DATE:  11/23/2017  REFERRING MD:  Grandville Silos  CHIEF COMPLAINT:  Shortness of breath  HISTORY OF PRESENT ILLNESS:   77 y/o male with a history of COPD, EtOH use, CAD and GERD here with a large left complicated pleural effusion who went for a VATS drainage 3/25.     SUBJECTIVE:  Chest tube output has decreased overnight, reexploration for bleeding Hemoglobin 9.3 >> 8.5 Starting to wake up, propofol being weaned Has not done pressure support yet this morning 2 L chest tubes to suction  VITAL SIGNS: BP 122/71   Pulse 80   Temp 97.7 F (36.5 C) (Oral)   Resp 18   Ht 5\' 5"  (1.651 m)   Wt 66.7 kg (147 lb 0.8 oz)   SpO2 100%   BMI 24.47 kg/m   HEMODYNAMICS:    VENTILATOR SETTINGS: Vent Mode: CPAP;PSV FiO2 (%):  [30 %-50 %] 30 % Set Rate:  [18 bmp] 18 bmp Vt Set:  [490 mL] 490 mL PEEP:  [5 cmH20] 5 cmH20 Pressure Support:  [10 cmH20] 10 cmH20 Plateau Pressure:  [12 cmH20-13 cmH20] 13 cmH20  INTAKE / OUTPUT: I/O last 3 completed shifts: In: 7817 [I.V.:4056.7; Blood:2010.3; NG/GT:150; IV Piggyback:1600] Out: 8144 [Urine:795; Emesis/NG output:350; Other:600; Blood:1250; Chest Tube:860]  PHYSICAL EXAMINATION: General:  Elderly male on vent Neuro:  Sedated HEENT:  [place]/AT, No JVD noted, PERRL Cardiovascular:  RRR, no MRG Lungs: Clear bilateral breath sounds Abdomen:  Soft, non-distended Musculoskeletal:  No acute deformity Skin:  Intact, MMM  LABS:  BMET Recent Labs  Lab 11/24/17 0225  11/24/17 1735 11/24/17 1807 11/25/17 0345  NA 129*   < > 135 131* 134*  K 3.8   < > 4.1 4.0 4.1  CL 101  --   --  105 106  CO2 21*  --   --  19* 20*  BUN 9  --   --  9 11  CREATININE 0.96  --   --  0.87 0.98  GLUCOSE 90  --  102* 103* 116*   < > = values in this interval not displayed.    Electrolytes Recent Labs  Lab 11/22/17 0658  11/23/17 0226 11/24/17 0225 11/24/17 1807 11/25/17 0345  CALCIUM 6.8* 7.1* 7.4* 6.8* 8.4*  MG 1.7 2.1 1.9  --   --     CBC Recent Labs  Lab 11/24/17 1807 11/24/17 2239 11/25/17 0345  WBC 12.8* 13.2* 13.8*  HGB 10.9* 9.3* 8.5*  HCT 32.2* 26.8* 24.1*  PLT 244 221 221    Coag's Recent Labs  Lab 11/23/17 1806 11/24/17 1500 11/24/17 1807  APTT 44* 27 29  INR 1.17 1.28 1.40    Sepsis Markers Recent Labs  Lab 11/21/17 2205 11/24/17 1756 11/24/17 2145  LATICACIDVEN 1.5 0.8 0.7    ABG Recent Labs  Lab 11/24/17 1626 11/24/17 1750 11/25/17 0356  PHART 7.325* 7.341* 7.363  PCO2ART 38.0 34.6 35.1  PO2ART 117.0* 137.0* 116*    Liver Enzymes Recent Labs  Lab 11/21/17 1000 11/22/17 0658 11/23/17 0226  AST 40 23 27  ALT 26 15* 14*  ALKPHOS 70 56 56  BILITOT 1.0 0.6 0.8  ALBUMIN 2.5* 1.7* 1.6*    Cardiac Enzymes No results for input(s): TROPONINI, PROBNP in the last 168 hours.  Glucose Recent Labs  Lab 11/22/17 1230 11/24/17 2349 11/25/17 0505  GLUCAP 185* 100* 101*  Imaging Portable Chest Xray  Result Date: 11/25/2017 CLINICAL DATA:  Acute respiratory failure with hypoxia. Intubated patient. Status post video-assisted thoracic surgery for empyema drainage. History of coronary artery disease and COPD. Current smoker. EXAM: PORTABLE CHEST 1 VIEW COMPARISON:  Portable chest x-ray of November 24, 2017 FINDINGS: The lungs are adequately inflated. There are bilateral pleural effusions greatest on the left. The heart is top-normal in size. The pulmonary vascularity is not engorged. There is improved aeration of the mid and upper right lung. The interstitial markings remain increased throughout much of the left lung. The endotracheal tube tip projects 3.9 cm above the carina. The esophagogastric tube tip and proximal port project below the GE junction. The right internal jugular venous catheter tip projects over the midportion of the SVC. IMPRESSION: Increase  conspicuity of bilateral pleural effusions. Increased interstitial density at the throughout the left lung worrisome for progressive pneumonia. The support tubes are in reasonable position. Thoracic aortic atherosclerosis. Electronically Signed   By: David  Martinique M.D.   On: 11/25/2017 08:56   Dg Chest Portable 1 View  Result Date: 11/24/2017 CLINICAL DATA:  Postop EXAM: PORTABLE CHEST 1 VIEW COMPARISON:  Portable exam 1700 hours compared to 11/23/2017 FINDINGS: Tips of lung apices excluded. Tip of endotracheal tube projects approximately 4.0 cm above carina, carina poorly visualized. Tip of RIGHT jugular central venous catheter projects over SVC. Pair of LEFT thoracostomy tubes. Normal heart size, mediastinal contours, and pulmonary vascularity. Atherosclerotic calcification aorta. LEFT basilar atelectasis. Scattered subsegmental atelectasis and mid and lower RIGHT lung. No definite pneumothorax. Minimal LEFT pleural effusion again seen. EKG leads project over chest. Retained contrast in upper abdomen. IMPRESSION: No pneumothorax following central line and LEFT thoracostomy tube placement. Persistent LEFT basilar atelectasis and small LEFT pleural effusion. Scattered subsegmental atelectasis RIGHT lung. No definite retained radiopaque foreign bodies visualized. Electronically Signed   By: Lavonia Dana M.D.   On: 11/24/2017 17:23     STUDIES:  09/2017 Echo>  3/23 CT chest > emphysema, large left pleural effusion 3/23 left pleural fluid> WBC 2375, 90% PMN   CULTURES: 3/25 pleural bact >  3/25 pleural AFB >  3/25 pleural fung >  3/23 pleural >  3/22 blood >  3/22 urine >   ANTIBIOTICS: 3/22 zosyn >  3/22 vanc >   SIGNIFICANT EVENTS:   LINES/TUBES: 3/25 ETT >  3/25 2 Left chest tubes >  3/24 R IJ CVC >>   DISCUSSION: 77 y/o male with empyema, now s/p VATS drainage.  At baseline he has multiple medical problems including cardiomyopathy, COPD, and CAD.    ASSESSMENT /  PLAN:  PULMONARY A: Acute respiratory failure with hypoxemia HCAP Empyema s/p VATS drainage 3/25 COPD P:   Continue efforts at spontaneous breathing today as sedation is lifted Chest tubes currently to suction, no plans to remove 3/26 Follow chest x-ray VAP prevention orders Antibiotics as below  CARDIOVASCULAR A:  Baseline cardiomyopathy, chronic systolic heart failure Chronic hypotension  P:  Has not required phenylephrine Midodrine on hold    RENAL A:   AKI: improved P:   Follow BMP, UOP Replace electrolytes as needed  GASTROINTESTINAL A:   No acute issues P:   PPI for SUP If unable to extubate today then will start TF  HEMATOLOGIC A: Anemia, underwent re-exploration post-op for bleeder, improved P:   Follow CBC  INFECTIOUS A:   Empyema P:   vanco + zosyn since 3/22 Follow cx data  ENDOCRINE A:   No acute  issues P:   Follow glucose  NEUROLOGIC A:   Need for sedation for vent synchrony P:   RASS goal: -1. Wean propofol this am Fentanyl prn Daily WUA.  FAMILY  - Updates: no family at bedside this am   - Inter-disciplinary family meet or Palliative Care meeting due by:  4/2   Independent CC time 71 minutes  Baltazar Apo, MD, PhD 11/25/2017, 9:20 AM [facility] [phone] or if no answer [phone]

## 2017-11-25 NOTE — Telephone Encounter (Signed)
Left message for patient to return my call.

## 2017-11-26 DIAGNOSIS — J449 Chronic obstructive pulmonary disease, unspecified: Secondary | ICD-10-CM

## 2017-11-26 LAB — CBC
HCT: 27.1 % — ABNORMAL LOW (ref 39.0–52.0)
HEMOGLOBIN: 9.3 g/dL — AB (ref 13.0–17.0)
MCH: 29.8 pg (ref 26.0–34.0)
MCHC: 34.3 g/dL (ref 30.0–36.0)
MCV: 86.9 fL (ref 78.0–100.0)
Platelets: 314 10*3/uL (ref 150–400)
RBC: 3.12 MIL/uL — AB (ref 4.22–5.81)
RDW: 15.8 % — ABNORMAL HIGH (ref 11.5–15.5)
WBC: 16.8 10*3/uL — ABNORMAL HIGH (ref 4.0–10.5)

## 2017-11-26 LAB — GLUCOSE, CAPILLARY
GLUCOSE-CAPILLARY: 70 mg/dL (ref 65–99)
Glucose-Capillary: 73 mg/dL (ref 65–99)
Glucose-Capillary: 74 mg/dL (ref 65–99)
Glucose-Capillary: 93 mg/dL (ref 65–99)

## 2017-11-26 LAB — ACID FAST SMEAR (AFB, MYCOBACTERIA)
Acid Fast Smear: NEGATIVE
Acid Fast Smear: NEGATIVE

## 2017-11-26 LAB — COMPREHENSIVE METABOLIC PANEL
ALK PHOS: 53 U/L (ref 38–126)
ALT: 17 U/L (ref 17–63)
ANION GAP: 7 (ref 5–15)
AST: 35 U/L (ref 15–41)
Albumin: 2.1 g/dL — ABNORMAL LOW (ref 3.5–5.0)
BUN: 9 mg/dL (ref 6–20)
CALCIUM: 7.7 mg/dL — AB (ref 8.9–10.3)
CO2: 19 mmol/L — ABNORMAL LOW (ref 22–32)
CREATININE: 0.83 mg/dL (ref 0.61–1.24)
Chloride: 109 mmol/L (ref 101–111)
Glucose, Bld: 85 mg/dL (ref 65–99)
Potassium: 4.3 mmol/L (ref 3.5–5.1)
Sodium: 135 mmol/L (ref 135–145)
Total Bilirubin: 1 mg/dL (ref 0.3–1.2)
Total Protein: 4.8 g/dL — ABNORMAL LOW (ref 6.5–8.1)

## 2017-11-26 LAB — CULTURE, BLOOD (ROUTINE X 2)
CULTURE: NO GROWTH
CULTURE: NO GROWTH
Special Requests: ADEQUATE

## 2017-11-26 LAB — AEROBIC CULTURE W GRAM STAIN (SUPERFICIAL SPECIMEN): Culture: NO GROWTH

## 2017-11-26 LAB — CULTURE, RESPIRATORY W GRAM STAIN
Culture: NO GROWTH
Gram Stain: NONE SEEN

## 2017-11-26 LAB — VANCOMYCIN, TROUGH: Vancomycin Tr: 23 ug/mL (ref 15–20)

## 2017-11-26 LAB — ACID FAST SMEAR (AFB): ACID FAST SMEAR - AFSCU2: NEGATIVE

## 2017-11-26 MED ORDER — VANCOMYCIN HCL 500 MG IV SOLR
500.0000 mg | Freq: Two times a day (BID) | INTRAVENOUS | Status: DC
  Administered 2017-11-27 (×2): 500 mg via INTRAVENOUS
  Filled 2017-11-26 (×2): qty 500

## 2017-11-26 MED ORDER — HYDRALAZINE HCL 20 MG/ML IJ SOLN
10.0000 mg | INTRAMUSCULAR | Status: DC | PRN
  Administered 2017-11-26: 20 mg via INTRAVENOUS
  Administered 2017-11-27 – 2017-12-02 (×5): 10 mg via INTRAVENOUS
  Filled 2017-11-26 (×5): qty 1

## 2017-11-26 MED ORDER — IPRATROPIUM BROMIDE 0.02 % IN SOLN
0.5000 mg | Freq: Two times a day (BID) | RESPIRATORY_TRACT | Status: DC
  Filled 2017-11-26: qty 2.5

## 2017-11-26 MED ORDER — LEVALBUTEROL HCL 0.63 MG/3ML IN NEBU
0.6300 mg | INHALATION_SOLUTION | Freq: Four times a day (QID) | RESPIRATORY_TRACT | Status: DC | PRN
Start: 2017-11-26 — End: 2017-12-03

## 2017-11-26 MED ORDER — ORAL CARE MOUTH RINSE
15.0000 mL | Freq: Two times a day (BID) | OROMUCOSAL | Status: DC
  Administered 2017-11-26: 15 mL via OROMUCOSAL

## 2017-11-26 MED ORDER — CHLORHEXIDINE GLUCONATE 0.12 % MT SOLN
15.0000 mL | Freq: Two times a day (BID) | OROMUCOSAL | Status: DC
  Administered 2017-11-26 – 2017-11-28 (×4): 15 mL via OROMUCOSAL
  Filled 2017-11-26 (×4): qty 15

## 2017-11-26 MED ORDER — ORAL CARE MOUTH RINSE: 15.0000 mL | Freq: Two times a day (BID) | OROMUCOSAL | Status: DC

## 2017-11-26 MED ORDER — FENTANYL CITRATE (PF) 100 MCG/2ML IJ SOLN
50.0000 ug | INTRAMUSCULAR | Status: DC | PRN
  Administered 2017-11-27: 50 ug via INTRAVENOUS
  Filled 2017-11-26: qty 2

## 2017-11-26 MED ORDER — ORAL CARE MOUTH RINSE
15.0000 mL | Freq: Two times a day (BID) | OROMUCOSAL | Status: DC
  Administered 2017-11-26 (×2): 15 mL via OROMUCOSAL

## 2017-11-26 MED ORDER — KETOROLAC TROMETHAMINE 30 MG/ML IJ SOLN
15.0000 mg | Freq: Four times a day (QID) | INTRAMUSCULAR | Status: DC
  Administered 2017-11-26: 15 mg via INTRAVENOUS
  Filled 2017-11-26: qty 1

## 2017-11-26 MED ORDER — CHLORHEXIDINE GLUCONATE 0.12 % MT SOLN
15.0000 mL | Freq: Two times a day (BID) | OROMUCOSAL | Status: DC
  Administered 2017-11-26: 15 mL via OROMUCOSAL

## 2017-11-26 MED ORDER — LEVALBUTEROL HCL 0.63 MG/3ML IN NEBU
0.6300 mg | INHALATION_SOLUTION | Freq: Two times a day (BID) | RESPIRATORY_TRACT | Status: DC
  Filled 2017-11-26: qty 3

## 2017-11-26 MED ORDER — IPRATROPIUM BROMIDE 0.02 % IN SOLN: 0.5000 mg | Freq: Four times a day (QID) | RESPIRATORY_TRACT | Status: DC | PRN

## 2017-11-26 NOTE — Progress Notes (Signed)
PULMONARY / CRITICAL CARE MEDICINE   Name: Brad Turner MRN: [id number] DOB: [birth year]    ADMISSION DATE:  11/21/2017 CONSULTATION DATE:  11/23/2017  REFERRING MD:  Grandville Silos  CHIEF COMPLAINT:  Shortness of breath  HISTORY OF PRESENT ILLNESS:   76 y/o male with a history of COPD, EtOH use, CAD and GERD here with a large left complicated pleural effusion who went for a VATS drainage 3/25.     SUBJECTIVE:  Extubated overnight, tolerated well. No additional events.   VITAL SIGNS: BP (!) 150/89   Pulse (!) 103   Temp 97.9 F (36.6 C) (Oral)   Resp 14   Ht 5\' 5"  (1.651 m)   Wt 66.6 kg (146 lb 13.2 oz)   SpO2 97%   BMI 24.43 kg/m   HEMODYNAMICS:    VENTILATOR SETTINGS: Vent Mode: CPAP;PSV FiO2 (%):  [30 %] 30 % PEEP:  [5 cmH20] 5 cmH20 Pressure Support:  [5 cmH20-8 cmH20] 5 cmH20  INTAKE / OUTPUT: I/O last 3 completed shifts: In: 5608.2 [I.V.:3808.2; NG/GT:150; IV Piggyback:1650] Out: 2125 [Urine:1095; Emesis/NG output:350; Chest Tube:680]  PHYSICAL EXAMINATION: General:  Elderly male, in NAD Neuro:  A&O x 3, no deficits HEENT:  [place]/AT, No JVD noted, EOMI Cardiovascular:  RRR, no MRG Lungs: Clear bilateral breath sounds. Left chest tube in place, no air leak Abdomen:  Soft, non-distended Musculoskeletal:  No acute deformity Skin:  Intact, MMM  LABS:  BMET Recent Labs  Lab 11/24/17 1807 11/25/17 0345 11/26/17 0301  NA 131* 134* 135  K 4.0 4.1 4.3  CL 105 106 109  CO2 19* 20* 19*  BUN 9 11 9   CREATININE 0.87 0.98 0.83  GLUCOSE 103* 116* 85    Electrolytes Recent Labs  Lab 11/22/17 0658 11/23/17 0226 11/24/17 0225 11/24/17 1807 11/25/17 0345 11/26/17 0301  CALCIUM 6.8* 7.1* 7.4* 6.8* 8.4* 7.7*  MG 1.7 2.1 1.9  --   --   --     CBC Recent Labs  Lab 11/24/17 2239 11/25/17 0345 11/26/17 0301  WBC 13.2* 13.8* 16.8*  HGB 9.3* 8.5* 9.3*  HCT 26.8* 24.1* 27.1*  PLT 221 221 314    Coag's Recent Labs  Lab 11/23/17 1806 11/24/17 1500  11/24/17 1807  APTT 44* 27 29  INR 1.17 1.28 1.40    Sepsis Markers Recent Labs  Lab 11/21/17 2205 11/24/17 1756 11/24/17 2145  LATICACIDVEN 1.5 0.8 0.7    ABG Recent Labs  Lab 11/24/17 1626 11/24/17 1750 11/25/17 0356  PHART 7.325* 7.341* 7.363  PCO2ART 38.0 34.6 35.1  PO2ART 117.0* 137.0* 116*    Liver Enzymes Recent Labs  Lab 11/22/17 0658 11/23/17 0226 11/26/17 0301  AST 23 27 35  ALT 15* 14* 17  ALKPHOS 56 56 53  BILITOT 0.6 0.8 1.0  ALBUMIN 1.7* 1.6* 2.1*    Cardiac Enzymes No results for input(s): TROPONINI, PROBNP in the last 168 hours.  Glucose Recent Labs  Lab 11/24/17 2349 11/25/17 0505 11/25/17 1218 11/25/17 1814 11/25/17 2356 11/26/17 0600  GLUCAP 100* 101* 91 86 93 70    Imaging No results found.   STUDIES:  09/2017 Echo>  3/23 CT chest > emphysema, large left pleural effusion 3/23 left pleural fluid> WBC 2375, 90% PMN   CULTURES: 3/25 pleural bact >  3/25 pleural AFB >  3/25 pleural fung >  3/23 pleural >  3/22 blood >  3/22 urine >   ANTIBIOTICS: 3/22 zosyn >  3/22 vanc >   SIGNIFICANT EVENTS: 3/25 >  intubated 3/26 > left VATS with decortication 3/26 > extubated  LINES/TUBES: 3/25 ETT > 3/26 3/25 2 Left chest tubes >  3/24 R IJ CVC >>   DISCUSSION: 77 y/o male with empyema, now s/p VATS drainage.  At baseline he has multiple medical problems including cardiomyopathy, COPD, and CAD.    ASSESSMENT / PLAN:  PULMONARY A: Acute respiratory failure with hypoxemia - required intubation, now s/p extubation 3/26 HCAP Empyema s/p VATS drainage 3/25 COPD P:   Continue supplemental O2 as needed to maintain SpO2 > 92%. Bronchial hygiene. Chest tube per CVTS. Continue abx. Follow CXR.  CARDIOVASCULAR A:  Baseline cardiomyopathy, chronic systolic heart failure Chronic hypotension P:  Midodrine on hold    RENAL A:   AKI - resolved. P:   Follow BMP, UOP Replace electrolytes as  needed  GASTROINTESTINAL A:   No acute issues P:   PPI for SUP If unable to extubate today then will start TF  HEMATOLOGIC A: Anemia, underwent re-exploration post-op for bleeder, improved P:   Transfuse for Hgb < 7 Follow CBC  INFECTIOUS A:   Empyema - s/p VATS decortication 3/26 P:   vanco + zosyn since 3/22 Follow cx data  ENDOCRINE A:   No acute issues P:   Follow glucose  NEUROLOGIC A:   No acute events P:   No interventions required  FAMILY  - Updates: no family at bedside this am   - Inter-disciplinary family meet or Palliative Care meeting due by:  4/2   Nothing further to add.  PCCM will sign off.  Please do not hesitate to call us back if we can be of any further assistance.   Montey Hora, [facility] Pager: [phone]  or [phone] 11/26/2017, 9:35 AM

## 2017-11-26 NOTE — Plan of Care (Signed)
Patient cannot stand to get up, pivoted to chair by 2 staff.

## 2017-11-26 NOTE — Progress Notes (Signed)
Patient ID: Brad Turner, male   DOB: 01-Oct-1940, 77 y.o.   MRN: [id number] TCTS Evening Rounds  He has been hemodynamically stable today.  CT output low.  Got out of bed with assistance but very weak and deconditioned. Will need PT consult.

## 2017-11-26 NOTE — Progress Notes (Signed)
[facility] Progress Note Patient Name: Brad Turner DOB: 1941-04-01 MRN: [id number]   Date of Service  11/26/2017  HPI/Events of Note    eICU Interventions  PRN hydralazine     Intervention Category Intermediate Interventions: Hypertension - evaluation and management  Wilhelmina Mcardle 11/26/2017, 1:08 AM

## 2017-11-26 NOTE — Progress Notes (Signed)
Pharmacy Antibiotic Note  Brad Turner is a 77 y.o. male with empyema s/p VATs 3/26 on vancomycin and zosyn.  -WBC= 16.8, afeb, SCr= 0.8 and CrCl ~ 70 -vancomycin trough= 23 at ~1:30pm (last dose given at ~ 1am) -cultures- ngtd  Plan: -change vancomycin to 500mg  IV q12h -No zosyn dose changes needed -Will follow renal function, cultures and clinical progress    Height: 5\' 5"  (165.1 cm) Weight: 146 lb 13.2 oz (66.6 kg) IBW/kg (Calculated) : 61.5  Temp (24hrs), Avg:98 F (36.7 C), Min:97.6 F (36.4 C), Max:99.1 F (37.3 C)  Recent Labs  Lab 11/21/17 1328 11/21/17 1839 11/21/17 2205  11/23/17 0226 11/23/17 1057 11/24/17 0225 11/24/17 1632 11/24/17 1756 11/24/17 1807 11/24/17 2145 11/24/17 2239 11/25/17 0345 11/26/17 0301 11/26/17 1338  WBC  --   --   --    < > 17.4*  --  15.7* 17.6*  --  12.8*  --  13.2* 13.8* 16.8*  --   CREATININE  --   --   --    < > 0.88  --  0.96  --   --  0.87  --   --  0.98 0.83  --   LATICACIDVEN 1.17 0.9 1.5  --   --   --   --   --  0.8  --  0.7  --   --   --   --   VANCOTROUGH  --   --   --   --   --  10*  --   --   --   --   --   --   --   --  23*   < > = values in this interval not displayed.    Estimated Creatinine Clearance: 67.9 mL/min (by C-G formula based on SCr of 0.83 mg/dL).    Allergies  Allergen Reactions  . Ivp Dye [Iodinated Diagnostic Agents] Hives and Shortness Of Breath    reaction was when pt was 77 years old.  Clancy Gourd [Nitrofurantoin] Other (See Comments)    Fever that lasted several months    Antimicrobials this admission: 3/22 Vancomycin >> 3/22 Zosyn >>   Microbiology results: 3/25 resp- ngtd 3/25 pleural fluid- ngtd 3/22 BCx: NGTD 3/22 MRSA: neg 3/22 UCx: neg 3/22 Legionella: pending 3/22 S. Pneumo: neg   Thank you for allowing pharmacy to be a part of this patient's care.  Hildred Laser, PharmD Clinical Pharmacist Clinical phone from 8:30-4:00 is [phone] After 4pm, please call Main Rx  [phone]) for assistance. 11/26/2017 2:47 PM

## 2017-11-26 NOTE — Evaluation (Signed)
Clinical/Bedside Swallow Evaluation Patient Details  Name: Brad Turner MRN: [id number] Date of Birth: June 05, 1941  Today's Date: 11/26/2017 Time: SLP Start Time (ACUTE ONLY): 0848 SLP Stop Time (ACUTE ONLY): 0858 SLP Time Calculation (min) (ACUTE ONLY): 10 min  Past Medical History:  Past Medical History:  Diagnosis Date  . Alcohol withdrawal ([place])   . Allergy    seasonal  . Arthritis   . Barrett esophagus   . BPH (benign prostatic hyperplasia)   . CAD (coronary artery disease)    a. s/p multiple POBA last in 2011 to distal OM2. b. Takotsubo event 09/2017 with cath showing no culprit, 25% prox LAD, 60% OM1, 65% OM2, EF 35-40% by cath and 30-35% by echo.  . Chronic back pain   . COPD (chronic obstructive pulmonary disease) ([facility])    Albuterol inhaler as needed.Uses Symbicort daily  . Depression   . Emphysema lung ([place])   . Fall    fell on 10/06/16 with loss of consiousness for a short time.States he remembers getting up but remembers nothing else.  . Gastric ulcer   . Gastritis   . GERD (gastroesophageal reflux disease)    takes Omeprazole daily  . History of colon polyps    benign  . History of kidney stones   . Hyperlipidemia   . Hypertension   . Hyponatremia   . Joint pain   . Joint swelling   . 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 Neuropathy   . Nocturia   . Pneumonia    hx of  . PONV (postoperative nausea and vomiting)   . Skin cancer   . Stroke [facility]) 1995   Affected balance; and has very emotional if watching a movie  . Syncope    yr ago  . Takotsubo cardiomyopathy    a. 09/2017 - EF 35-40% by cath, 30-35% by echo.  . Urgency of urination    takes Myrbetriq daily   Past Surgical History:  Past Surgical History:  Procedure Laterality Date  . APPENDECTOMY    . CARDIAC CATHETERIZATION     x 4. Most recent one in 2011 at [facility](3 previous were done 20 yrs before)  . cataracts Bilateral   . CHEST EXPLORATION Left  11/24/2017   Procedure: CHEST EXPLORATION;  Surgeon: [name], MD;  Location: [facility];  Service: Thoracic;  Laterality: Left;  . COLONOSCOPY    . CYSTOSCOPY  07/21/2013   Dr. Jeffie Pollock  . DENTAL SURGERY     with sedation  . ESOPHAGEAL MANOMETRY N/A 09/20/2015   Procedure: ESOPHAGEAL MANOMETRY (EM);  Surgeon: Manus Gunning, MD;  Location: [facility];  Service: Gastroenterology;  Laterality: N/A;  . LEFT HEART CATH AND CORONARY ANGIOGRAPHY N/A 09/15/2017   Procedure: LEFT HEART CATH AND CORONARY ANGIOGRAPHY;  Surgeon: Martinique, Peter M, MD;  Location: [place] [facility];  Service: Cardiovascular;  Laterality: N/A;  . LUMBAR LAMINECTOMY/DECOMPRESSION MICRODISCECTOMY N/A 01/12/2015   Procedure: LUMBAR LAMINECTOMY/DECOMPRESSION MICRODISCECTOMY 3 LEVELS;  Surgeon: Phylliss Bob, MD;  Location: [facility];  Service: Orthopedics;  Laterality: N/A;  Lumbar 3-4, lumbar 4-5, lumbar 5-sacrum 1 decompression  . parathyroid adenoma removed    . POLYPECTOMY    . ROTATOR CUFF REPAIR     bilateral  . TONSILLECTOMY    . UPPER GASTROINTESTINAL ENDOSCOPY    . VIDEO ASSISTED THORACOSCOPY (VATS)/EMPYEMA Left 11/24/2017   Procedure: VIDEO ASSISTED THORACOSCOPY (VATS)/EMPYEMA;  Surgeon: [name], MD;  Location: [facility];  Service: Thoracic;  Laterality: Left;  Marland Kitchen VIDEO BRONCHOSCOPY N/A 11/24/2017   Procedure: VIDEO BRONCHOSCOPY;  Surgeon: [name], MD;  Location: [facility];  Service: Thoracic;  Laterality: N/A;   HPI:  Brad Turner a 77 y.o.malewith medical history significantfor alcohol abuse, recent Takotsubo gammopathy with ST elevation MI including cardiac cath with stent,hypertension, COPD, tobacco use, serious CVA, admitted with abdominal pain and shortness of breath. Initial evaluation reveals sepsis likely related to healthcare associated pneumonia. CXR 3/24 revealed CXR reveals persistent severe consolidation of entire LLL with a significant remaining amount of pleural fluid which  appears loculated, VATS 3/25 for empyema, intubated 3/25-3/26. Repeat CXR 3/26 Increase conspicuity of bilateral pleural effusions. Increased interstitial density at the throughout the left lung worrisome for progressive pneumonia.   Assessment / Plan / Recommendation Clinical Impression  Pt is not ready for po intake at this time. He is lethargic but awakes with min verbal cues with hoarse and low vocal quality and intensity. Cough is weak and pt exhibits facial grimace with effortful swallow with each trial ice chip and water. Possible airway intrusion with delayed and subtle throat clears. He affrims odonophagia; soft palate candidias (?) with question of pharyngeal. Intermittent ice chps after oral care allowed but no other po's recommended. ST will continue to follow.   SLP Visit Diagnosis: Dysphagia, unspecified (R13.10)    Aspiration Risk  Moderate aspiration risk    Diet Recommendation NPO   Medication Administration: Via alternative means    Other  Recommendations Oral Care Recommendations: Oral care QID;Oral care prior to ice chip/H20   Follow up Recommendations Other (comment)(TBD)      Frequency and Duration min 2x/week  2 weeks       Prognosis Prognosis for Safe Diet Advancement: Good      Swallow Study   General HPI: Brad Turner a 77 y.o.malewith medical history significantfor alcohol abuse, recent Takotsubo gammopathy with ST elevation MI including cardiac cath with stent,hypertension, COPD, tobacco use, serious CVA, admitted with abdominal pain and shortness of breath. Initial evaluation reveals sepsis likely related to healthcare associated pneumonia. CXR 3/24 revealed CXR reveals persistent severe consolidation of entire LLL with a significant remaining amount of pleural fluid which appears loculated, VATS 3/25 for empyema, intubated 3/25-3/26. Repeat CXR 3/26 Increase conspicuity of bilateral pleural effusions. Increased interstitial density at the throughout  the left lung worrisome for progressive pneumonia. Type of Study: Bedside Swallow Evaluation Previous Swallow Assessment: (none) Diet Prior to this Study: NPO Temperature Spikes Noted: No Respiratory Status: Nasal cannula History of Recent Intubation: Yes Length of Intubations (days): 2 days Date extubated: 11/25/17 Behavior/Cognition: Requires cueing;Lethargic/Drowsy;Cooperative Oral Cavity Assessment: Dry;Other (comment)(palatal candidias) Oral Care Completed by SLP: Yes Oral Cavity - Dentition: Adequate natural dentition Vision: Functional for self-feeding Self-Feeding Abilities: Total assist(d/t lethargy) Patient Positioning: Upright in bed(limited due to back pain) Baseline Vocal Quality: Hoarse;Low vocal intensity Volitional Cough: Weak Volitional Swallow: Able to elicit    Oral/Motor/Sensory Function Overall Oral Motor/Sensory Function: Generalized oral weakness   Ice Chips Ice chips: Impaired Presentation: Spoon Oral Phase Impairments: Reduced lingual movement/coordination Oral Phase Functional Implications: Prolonged oral transit Pharyngeal Phase Impairments: Throat Clearing - Delayed;Decreased hyoid-laryngeal movement(grimace, effortful)   Thin Liquid Thin Liquid: Impaired Presentation: Spoon Oral Phase Impairments: Reduced lingual movement/coordination Pharyngeal  Phase Impairments: Decreased hyoid-laryngeal movement;Throat Clearing - Delayed;Other (comments)(grimace, effortful)    Nectar Thick Nectar Thick Liquid: Not tested   Honey Thick Honey Thick Liquid: Not tested  Puree Puree: Not tested   Solid   GO   Solid: Not tested        Houston Siren 11/26/2017,9:13 AM  Orbie Pyo Colvin Caroli.[employer] [phone]

## 2017-11-26 NOTE — Plan of Care (Signed)
  Problem: Clinical Measurements: Goal: Will remain free from infection Outcome: Progressing Goal: Diagnostic test results will improve Outcome: Progressing Goal: Respiratory complications will improve Outcome: Progressing Goal: Cardiovascular complication will be avoided Outcome: Progressing   Problem: Coping: Goal: Level of anxiety will decrease Outcome: Progressing   Problem: Elimination: Goal: Will not experience complications related to bowel motility Outcome: Progressing Goal: Will not experience complications related to urinary retention Outcome: Progressing   Problem: Pain Managment: Goal: General experience of comfort will improve Outcome: Progressing   Problem: Safety: Goal: Ability to remain free from injury will improve Outcome: Progressing   Problem: Skin Integrity: Goal: Risk for impaired skin integrity will decrease Outcome: Progressing   Problem: Cardiac: Goal: Hemodynamic stability will improve Outcome: Progressing   Problem: Clinical Measurements: Goal: Postoperative complications will be avoided or minimized Outcome: Progressing   Problem: Respiratory: Goal: Respiratory status will improve Outcome: Progressing   Problem: Pain Management: Goal: Pain level will decrease Outcome: Progressing   Problem: Skin Integrity: Goal: Wound healing without signs and symptoms infection will improve Outcome: Progressing   Problem: Education: Goal: Knowledge of General Education information will improve Outcome: Not Progressing   Problem: Health Behavior/Discharge Planning: Goal: Ability to manage health-related needs will improve Outcome: Not Progressing   Problem: Clinical Measurements: Goal: Ability to maintain clinical measurements within normal limits will improve Outcome: Not Progressing   Problem: Activity: Goal: Risk for activity intolerance will decrease Outcome: Not Progressing   Problem: Nutrition: Goal: Adequate nutrition will be  maintained Outcome: Not Progressing   Problem: Education: Goal: Knowledge of disease or condition will improve Outcome: Not Progressing Goal: Knowledge of the prescribed therapeutic regimen will improve Outcome: Not Progressing   Problem: Activity: Goal: Risk for activity intolerance will decrease Outcome: Not Progressing

## 2017-11-26 NOTE — Progress Notes (Addendum)
TCTS DAILY ICU PROGRESS NOTE                   [address] [place]            [place] [address]          [phone]   2 Days Post-Op Procedure(s) (LRB): VIDEO ASSISTED THORACOSCOPY (VATS)/EMPYEMA (Left) VIDEO BRONCHOSCOPY (N/A) CHEST EXPLORATION (Left)  Total Length of Stay:  LOS: 5 days   Subjective: Patient hurting real on left side  bad this am.   Objective: Vital signs in last 24 hours: Temp:  [97.6 F (36.4 C)-99.1 F (37.3 C)] 97.9 F (36.6 C) (03/27 0750) Pulse Rate:  [89-115] 103 (03/27 0800) Cardiac Rhythm: Sinus tachycardia (03/27 0823) Resp:  [11-28] 14 (03/27 0800) BP: (115-167)/(69-106) 150/89 (03/27 0800) SpO2:  [97 %-100 %] 97 % (03/27 0824) Arterial Line BP: (96-180)/(54-103) 161/69 (03/27 0800) FiO2 (%):  [30 %] 30 % (03/26 1546) Weight:  [146 lb 13.2 oz (66.6 kg)] 146 lb 13.2 oz (66.6 kg) (03/27 0500)  Filed Weights   11/24/17 0812 11/25/17 0326 11/26/17 0500  Weight: 126 lb 8.7 oz (57.4 kg) 147 lb 0.8 oz (66.7 kg) 146 lb 13.2 oz (66.6 kg)    Weight change: 20 lb 4.5 oz (9.2 kg)   Hemodynamic parameters for last 24 hours:    Intake/Output from previous [place]: 03/26 0701 - 03/27 0700 In: 2959.6 [I.V.:2409.6; IV Piggyback:550] Out: 1000 [Urine:740; Chest Tube:260]  Intake/Output this shift: Total I/O In: 100 [I.V.:100] Out: 200 [Urine:200]  Current Meds: Scheduled Meds: . acetaminophen  1,000 mg Oral Q6H   Or  . acetaminophen (TYLENOL) oral liquid 160 mg/5 mL  1,000 mg Oral Q6H  . arformoterol  15 mcg Nebulization BID  . atorvastatin  80 mg Oral Daily  . bisacodyl  10 mg Oral Daily  . budesonide (PULMICORT) nebulizer solution  0.5 mg Nebulization BID  . chlorhexidine  15 mL Mouth Rinse BID  . Chlorhexidine Gluconate Cloth  6 each Topical Daily  . DULoxetine  60 mg Oral Daily  . feeding supplement  1 Container Oral TID BM  . guaiFENesin  1,200 mg Oral BID  . insulin aspart  0-24 Units Subcutaneous Q6H  . ipratropium  0.5 mg  Nebulization Q6H  . levalbuterol  0.63 mg Nebulization Q6H  . loratadine  10 mg Oral Daily  . mouth rinse  15 mL Mouth Rinse q12n4p  . mirabegron ER  50 mg Oral Daily  . nicotine  21 mg Transdermal Daily  . pantoprazole  40 mg Oral Q0600  . QUEtiapine  25 mg Oral QHS  . senna-docusate  1 tablet Oral QHS  . sodium chloride flush  10-40 mL Intracatheter Q12H   Continuous Infusions: . 0.9 % NaCl with KCl 20 mEq / L 100 mL/hr at 11/26/17 0800  . phenylephrine (NEO-SYNEPHRINE) Adult infusion    . piperacillin-tazobactam (ZOSYN)  IV Stopped (11/26/17 0555)  . potassium chloride    . propofol (DIPRIVAN) infusion Stopped (11/25/17 0852)  . vancomycin Stopped (11/26/17 0203)   PRN Meds:.fentaNYL (SUBLIMAZE) injection, hydrALAZINE, loperamide, ondansetron (ZOFRAN) IV, potassium chloride, sodium chloride flush, sorbitol  General appearance: alert, cooperative and no distress Neurologic: intact Heart: Slightly tachycardic Lungs: Clear on the right and slightly diminished left base Abdomen: Soft, non tender, bowel sounds present Extremities: SCDS in place Wound: Dressing is clean and dry Chest tubes: to suction, no air leak  Lab Results: CBC: Recent Labs    11/25/17 0345 11/26/17  0301  WBC 13.8* 16.8*  HGB 8.5* 9.3*  HCT 24.1* 27.1*  PLT 221 314   BMET:  Recent Labs    11/25/17 0345 11/26/17 0301  NA 134* 135  K 4.1 4.3  CL 106 109  CO2 20* 19*  GLUCOSE 116* 85  BUN 11 9  CREATININE 0.98 0.83  CALCIUM 8.4* 7.7*    CMET: Lab Results  Component Value Date   WBC 16.8 (H) 11/26/2017   HGB 9.3 (L) 11/26/2017   HCT 27.1 (L) 11/26/2017   PLT 314 11/26/2017   GLUCOSE 85 11/26/2017   CHOL 131 09/14/2017   TRIG 76 11/24/2017   HDL 84 09/14/2017   LDLCALC 38 09/14/2017   ALT 17 11/26/2017   AST 35 11/26/2017   NA 135 11/26/2017   K 4.3 11/26/2017   CL 109 11/26/2017   CREATININE 0.83 11/26/2017   BUN 9 11/26/2017   CO2 19 (L) 11/26/2017   TSH 2.282 09/21/2017   INR  1.40 11/24/2017      PT/INR:  Recent Labs    11/24/17 1807  LABPROT 17.0*  INR 1.40   Radiology: No results found.   Assessment/Plan: S/P Procedure(s) (LRB): VIDEO ASSISTED THORACOSCOPY (VATS)/EMPYEMA (Left) VIDEO BRONCHOSCOPY (N/A) CHEST EXPLORATION (Left)  1. CV-Slightly tachycardic. 2. Pulmonary-Chest tubes with 260 cc of output last 24 hours. Chest tubes are to suction. There is no air leak. CXR this am has been ordered but not taken yet. On 1 liter of oxygen via [place]. Continue Brovana, Atrovent, Mucinex.Encourage incentive spirometer. 3. ID-WBC increased to 16,800. Likely related to SIRS. On Vancomycin and Zosyn. Culture showed no growth to date. Grams stain showed abundant WBC but no organisms. Check CBC in am. 4. Will give 2 doses of Toradol to help with pain-patient must get out of bed 5. Management per pulmonary/CCM    Donielle Liston Alba PA-C 11/26/2017 9:48 AM  Now extubated  I have seen and examined Juanda Bond Dains and agree with the above assessment  and plan.  [name] MD Beeper [phone] Office [phone] 11/26/2017 1:23 PM

## 2017-11-27 ENCOUNTER — Encounter (HOSPITAL_COMMUNITY): Payer: Self-pay | Admitting: *Deleted

## 2017-11-27 ENCOUNTER — Inpatient Hospital Stay (HOSPITAL_COMMUNITY): Payer: Medicare Other

## 2017-11-27 LAB — BPAM RBC
Blood Product Expiration Date: 201904232359
Blood Product Expiration Date: 201904232359
Blood Product Expiration Date: 201904232359
Blood Product Expiration Date: 201904232359
Blood Product Expiration Date: 201904232359
Blood Product Expiration Date: 201904232359
ISSUE DATE / TIME: 201903251039
ISSUE DATE / TIME: 201903251039
ISSUE DATE / TIME: 201903251507
ISSUE DATE / TIME: 201903251507
ISSUE DATE / TIME: 201903251507
ISSUE DATE / TIME: 201903251507
UNIT TYPE AND RH: 5100
UNIT TYPE AND RH: 5100
UNIT TYPE AND RH: 5100
Unit Type and Rh: 5100
Unit Type and Rh: 5100
Unit Type and Rh: 5100

## 2017-11-27 LAB — GLUCOSE, CAPILLARY
GLUCOSE-CAPILLARY: 66 mg/dL (ref 65–99)
GLUCOSE-CAPILLARY: 80 mg/dL (ref 65–99)
Glucose-Capillary: 118 mg/dL — ABNORMAL HIGH (ref 65–99)
Glucose-Capillary: 75 mg/dL (ref 65–99)
Glucose-Capillary: 85 mg/dL (ref 65–99)
Glucose-Capillary: 138 mg/dL — ABNORMAL HIGH (ref 65–99)

## 2017-11-27 LAB — BODY FLUID CULTURE: CULTURE: NO GROWTH

## 2017-11-27 LAB — TYPE AND SCREEN
ABO/RH(D): O POS
Antibody Screen: NEGATIVE
UNIT DIVISION: 0
UNIT DIVISION: 0
UNIT DIVISION: 0
Unit division: 0
Unit division: 0
Unit division: 0

## 2017-11-27 LAB — BASIC METABOLIC PANEL
ANION GAP: 7 (ref 5–15)
BUN: 8 mg/dL (ref 6–20)
CALCIUM: 8 mg/dL — AB (ref 8.9–10.3)
CO2: 21 mmol/L — ABNORMAL LOW (ref 22–32)
Chloride: 108 mmol/L (ref 101–111)
Creatinine, Ser: 0.9 mg/dL (ref 0.61–1.24)
GLUCOSE: 80 mg/dL (ref 65–99)
POTASSIUM: 4.2 mmol/L (ref 3.5–5.1)
SODIUM: 136 mmol/L (ref 135–145)

## 2017-11-27 LAB — PHOSPHORUS: PHOSPHORUS: 2.4 mg/dL — AB (ref 2.5–4.6)

## 2017-11-27 LAB — CBC
HCT: 24.7 % — ABNORMAL LOW (ref 39.0–52.0)
Hemoglobin: 8.2 g/dL — ABNORMAL LOW (ref 13.0–17.0)
MCH: 29.3 pg (ref 26.0–34.0)
MCHC: 33.2 g/dL (ref 30.0–36.0)
MCV: 88.2 fL (ref 78.0–100.0)
PLATELETS: 336 10*3/uL (ref 150–400)
RBC: 2.8 MIL/uL — AB (ref 4.22–5.81)
RDW: 15.6 % — AB (ref 11.5–15.5)
WBC: 11.7 10*3/uL — ABNORMAL HIGH (ref 4.0–10.5)

## 2017-11-27 LAB — CULTURE, BODY FLUID-BOTTLE: CULTURE: NO GROWTH

## 2017-11-27 LAB — MAGNESIUM: Magnesium: 1.5 mg/dL — ABNORMAL LOW (ref 1.7–2.4)

## 2017-11-27 LAB — CULTURE, BODY FLUID W GRAM STAIN -BOTTLE

## 2017-11-27 MED ORDER — MAGNESIUM SULFATE 2 GM/50ML IV SOLN
2.0000 g | Freq: Once | INTRAVENOUS | Status: AC
  Administered 2017-11-27: 2 g via INTRAVENOUS
  Filled 2017-11-27: qty 50

## 2017-11-27 MED ORDER — HYDROCODONE-ACETAMINOPHEN 5-325 MG PO TABS
1.0000 | ORAL_TABLET | Freq: Four times a day (QID) | ORAL | Status: DC | PRN
  Administered 2017-11-28 – 2017-11-29 (×2): 1 via ORAL
  Filled 2017-11-27 (×2): qty 1

## 2017-11-27 MED ORDER — K PHOS MONO-SOD PHOS DI & MONO 155-852-130 MG PO TABS
500.0000 mg | ORAL_TABLET | Freq: Two times a day (BID) | ORAL | Status: DC
  Administered 2017-11-27 – 2017-12-01 (×9): 500 mg via ORAL
  Filled 2017-11-27 (×9): qty 2

## 2017-11-27 MED ORDER — DEXTROSE 50 % IV SOLN
25.0000 mL | Freq: Once | INTRAVENOUS | Status: AC
  Administered 2017-11-27: 25 mL via INTRAVENOUS

## 2017-11-27 MED ORDER — DEXTROSE 50 % IV SOLN
INTRAVENOUS | Status: AC
  Filled 2017-11-27: qty 50

## 2017-11-27 MED ORDER — FUROSEMIDE 10 MG/ML IJ SOLN
40.0000 mg | Freq: Once | INTRAMUSCULAR | Status: AC
  Administered 2017-11-27: 40 mg via INTRAVENOUS
  Filled 2017-11-27: qty 4

## 2017-11-27 MED ORDER — LABETALOL HCL 5 MG/ML IV SOLN: 10.0000 mg | INTRAVENOUS | Status: DC

## 2017-11-27 MED ORDER — LABETALOL HCL 5 MG/ML IV SOLN
10.0000 mg | INTRAVENOUS | Status: DC | PRN
  Administered 2017-11-27 (×2): 10 mg via INTRAVENOUS
  Filled 2017-11-27 (×2): qty 4

## 2017-11-27 MED ORDER — IBUPROFEN 200 MG PO TABS
600.0000 mg | ORAL_TABLET | Freq: Four times a day (QID) | ORAL | Status: DC | PRN
  Administered 2017-11-28: 600 mg via ORAL
  Filled 2017-11-27: qty 3

## 2017-11-27 NOTE — Progress Notes (Addendum)
TCTS DAILY ICU PROGRESS NOTE                   [address]            [place] [address]          [phone]   3 Days Post-Op Procedure(s) (LRB): VIDEO ASSISTED THORACOSCOPY (VATS)/EMPYEMA (Left) VIDEO BRONCHOSCOPY (N/A) CHEST EXPLORATION (Left)  Total Length of Stay:  LOS: 6 days   Subjective:  States he feels like he has "glass" in his chest.  Not ambulating  No BM  Objective: Vital signs in last 24 hours: Temp:  [97.9 F (36.6 C)-98.6 F (37 C)] 98.2 F (36.8 C) (03/28 0400) Pulse Rate:  [103-117] 113 (03/28 0700) Cardiac Rhythm: Sinus tachycardia (03/28 0510) Resp:  [14-24] 18 (03/28 0700) BP: (130-165)/(77-107) 143/90 (03/28 0700) SpO2:  [88 %-99 %] 97 % (03/28 0700) Arterial Line BP: (141-161)/(59-75) 157/69 (03/27 1300)  Filed Weights   11/24/17 0812 11/25/17 0326 11/26/17 0500  Weight: 126 lb 8.7 oz (57.4 kg) 147 lb 0.8 oz (66.7 kg) 146 lb 13.2 oz (66.6 kg)    Weight change:     Intake/Output from previous [place]: 03/27 0701 - 03/28 0700 In: 1224 [I.V.:774; IV Piggyback:400] Out: 3875 [Urine:1475; Chest Tube:190]  Intake/Output this shift: No intake/output data recorded.  Current Meds: Scheduled Meds: . acetaminophen  1,000 mg Oral Q6H   Or  . acetaminophen (TYLENOL) oral liquid 160 mg/5 mL  1,000 mg Oral Q6H  . arformoterol  15 mcg Nebulization BID  . atorvastatin  80 mg Oral Daily  . bisacodyl  10 mg Oral Daily  . budesonide (PULMICORT) nebulizer solution  0.5 mg Nebulization BID  . chlorhexidine  15 mL Mouth Rinse BID  . Chlorhexidine Gluconate Cloth  6 each Topical Daily  . DULoxetine  60 mg Oral Daily  . feeding supplement  1 Container Oral TID BM  . guaiFENesin  1,200 mg Oral BID  . insulin aspart  0-24 Units Subcutaneous Q6H  . loratadine  10 mg Oral Daily  . mouth rinse  15 mL Mouth Rinse q12n4p  . mirabegron ER  50 mg Oral Daily  . nicotine  21 mg Transdermal Daily  . pantoprazole  40 mg Oral Q0600  . QUEtiapine  25  mg Oral QHS  . senna-docusate  1 tablet Oral QHS  . sodium chloride flush  10-40 mL Intracatheter Q12H   Continuous Infusions: . 0.9 % NaCl with KCl 20 mEq / L 10 mL/hr at 11/26/17 2000  . phenylephrine (NEO-SYNEPHRINE) Adult infusion    . piperacillin-tazobactam (ZOSYN)  IV 3.375 g (11/27/17 0322)  . potassium chloride    . vancomycin Stopped (11/27/17 0317)   PRN Meds:.fentaNYL (SUBLIMAZE) injection, hydrALAZINE, ipratropium, levalbuterol, loperamide, ondansetron (ZOFRAN) IV, potassium chloride, sodium chloride flush, sorbitol  General appearance: alert, cooperative and no distress Heart: regular rate and rhythm and tachy Lungs: diminished breath sounds LLL Abdomen: soft, non-tender; bowel sounds normal; no masses,  no organomegaly Extremities: edema 1+ pitting, UE + pitting edema Wound: clean and dry  Lab Results: CBC: Recent Labs    11/26/17 0301 11/27/17 0220  WBC 16.8* 11.7*  HGB 9.3* 8.2*  HCT 27.1* 24.7*  PLT 314 336   BMET:  Recent Labs    11/26/17 0301 11/27/17 0220  NA 135 136  K 4.3 4.2  CL 109 108  CO2 19* 21*  GLUCOSE 85 80  BUN 9 8  CREATININE 0.83 0.90  CALCIUM 7.7*  8.0*    CMET: Lab Results  Component Value Date   WBC 11.7 (H) 11/27/2017   HGB 8.2 (L) 11/27/2017   HCT 24.7 (L) 11/27/2017   PLT 336 11/27/2017   GLUCOSE 80 11/27/2017   CHOL 131 09/14/2017   TRIG 76 11/24/2017   HDL 84 09/14/2017   LDLCALC 38 09/14/2017   ALT 17 11/26/2017   AST 35 11/26/2017   NA 136 11/27/2017   K 4.2 11/27/2017   CL 108 11/27/2017   CREATININE 0.90 11/27/2017   BUN 8 11/27/2017   CO2 21 (L) 11/27/2017   TSH 2.282 09/21/2017   INR 1.40 11/24/2017      PT/INR:  Recent Labs    11/24/17 1807  LABPROT 17.0*  INR 1.40   Radiology: No results found.   Assessment/Plan: S/P Procedure(s) (LRB): VIDEO ASSISTED THORACOSCOPY (VATS)/EMPYEMA (Left) VIDEO BRONCHOSCOPY (N/A) CHEST EXPLORATION (Left)  1. Chest tube- no air leak present, 70 cc  output since midnight- will place chest tubes to water seal, may be able to remove 1 chest tube soon 2. CV- Sinus Tach, + HTN- may benefit from resumption of home Toprol XL at reduced dose 3. Pulm- no acute issues, off oxygen, CXR with residual left pleural effusion, continue IS 4. Renal- creatinine stable, he is edematous in UE and LE-- may benefit from resumption of home Lasix 5. Hypomagnesemia- may benefit from supplementation 6. Dysphagia- SLP evaluation pending 7. Dispo- patient stable, chest tube output is 70 ml since midnight, will place on water seal possibly d/c one chest tube soon, medical problems per primary  Erin Barrett 11/27/2017 7:44 AM   PT and OT ordered Ct to water seal resumption of  asa or Plavix per medical/ccm/cardiology service if needed, risk of bleeding from chest has decreased but would be careful adding Lovenox ,asa, and Plavix simultaneously  I have seen and examined Legrand Como D Padin and agree with the above assessment  and plan.  [name] MD Beeper [phone] Office [phone] 11/27/2017 8:06 AM

## 2017-11-27 NOTE — Evaluation (Signed)
Physical Therapy Evaluation Patient Details Name: Brad Turner MRN: [id number] DOB: 1941/04/02 Today's Date: 11/27/2017   History of Present Illness  Pt is a 77 y.o. male admitted 11/21/17 with acute respiratory failure secondary to HAP and pleural effusion in setting of COPD. Also with sepsis. Now s/p VATS/empyema and video bronchoscopy on 3/25. PMH includes HTN, COPD, chronic back pain, CAD, depression, neuropathy, macular degeneration, CVA (1995), lumbar laminectomy (2016).    Clinical Impression  Pt presents with an overall decrease in functional mobility secondary to above. PTA, pt indep and lives alone; has family available PRN. Today, pt required modA to stand and amb 5' x2 with [place] walker, requiring 1x seated rest break and max encouragement to attempt another bout of amb. Further mobility limited by c/o fatigue. Discussed recommendation for SNF-level therapies at d/c to maximize functional mobility and independence prior to return home; pt in agreement with this. Will follow acutely to address established goals.     Follow Up Recommendations SNF;Supervision/Assistance - 24 hour    Equipment Recommendations  (TBD next visit)    Recommendations for Other Services OT consult     Precautions / Restrictions Precautions Precautions: Fall Precaution Comments: central line, chest tube Restrictions Weight Bearing Restrictions: No      Mobility  Bed Mobility               General bed mobility comments: Received sitting in recliner  Transfers Overall transfer level: Needs assistance Equipment used: 4-wheeled walker(Eva walker) Transfers: Sit to/from Stand Sit to Stand: Mod assist;+2 safety/equipment         General transfer comment: Able to stand 2x with modA to assist trunk elevation and cues bilat hip extension. Repeated cues to push with BUEs from arm rests instead of pulling on Eva walker. Pt unable to achieve fully upright posture with complete hip extension despite  max multimodal cues  Ambulation/Gait Ambulation/Gait assistance: Mod assist;+2 safety/equipment Ambulation Distance (Feet): 10 Feet Assistive device: 4-wheeled walker(Eva walker) Gait Pattern/deviations: Step-to pattern;Decreased stride length;Trunk flexed Gait velocity: Decreased Gait velocity interpretation: <1.8 ft/sec, indicative of risk for recurrent falls General Gait Details: Slow, forward flexed amb 5 ft x2 with seated rest break in between; modA to steady RW and cue hip extension (+2 chair follow as pt required seated rest). Max encouragement to attempt second bout of amb  Stairs            Wheelchair Mobility    Modified Rankin (Stroke Patients Only)       Balance Overall balance assessment: Needs assistance   Sitting balance-Leahy Scale: Fair       Standing balance-Leahy Scale: Poor Standing balance comment: Reliant on UE support                             Pertinent Vitals/Pain Pain Assessment: Faces Faces Pain Scale: Hurts little more Pain Location: Chest tube insertion site Pain Descriptors / Indicators: Sore Pain Intervention(s): Monitored during session;Limited activity within patient's tolerance    Home Living Family/patient expects to be discharged to:: Private residence Living Arrangements: Alone Available Help at Discharge: Family;Available PRN/intermittently;Friend(s) Type of Home: House Home Access: Stairs to enter Entrance Stairs-Rails: None Entrance Stairs-Number of Steps: 2 Home Layout: Two level;Bed/bath upstairs;1/2 bath on main level Home Equipment: Shower seat      Prior Function Level of Independence: Independent         Comments: Vague answers regarding PLOF     Hand Dominance  Extremity/Trunk Assessment   Upper Extremity Assessment Upper Extremity Assessment: Generalized weakness    Lower Extremity Assessment Lower Extremity Assessment: Generalized weakness    Cervical / Trunk  Assessment Cervical / Trunk Assessment: Kyphotic  Communication   Communication: No difficulties  Cognition Arousal/Alertness: Awake/alert Behavior During Therapy: WFL for tasks assessed/performed Overall Cognitive Status: No family/caregiver present to determine baseline cognitive functioning Area of Impairment: Attention;Following commands;Safety/judgement;Awareness;Problem solving                   Current Attention Level: Sustained   Following Commands: Follows one step commands with increased time;Follows multi-step commands inconsistently Safety/Judgement: Decreased awareness of deficits;Decreased awareness of safety Awareness: Emergent Problem Solving: Slow processing;Difficulty sequencing;Requires verbal cues;Requires tactile cues        General Comments General comments (skin integrity, edema, etc.): Seated BP 149/111, post-amb BP 126/87. SpO2 >92% on RA. HR up to 130s    Exercises     Assessment/Plan    PT Assessment Patient needs continued PT services  PT Problem List Decreased strength;Decreased activity tolerance;Decreased balance;Decreased mobility;Decreased knowledge of use of DME;Decreased cognition;Cardiopulmonary status limiting activity       PT Treatment Interventions DME instruction;Gait training;Stair training;Functional mobility training;Therapeutic activities;Therapeutic exercise;Balance training;Patient/family education    PT Goals (Current goals can be found in the Care Plan section)  Acute Rehab PT Goals Patient Stated Goal: Return home PT Goal Formulation: With patient Time For Goal Achievement: 12/11/17 Potential to Achieve Goals: Fair    Frequency Min 3X/week   Barriers to discharge Decreased caregiver support      Co-evaluation               AM-PAC PT "6 Clicks" Daily Activity  Outcome Measure Difficulty turning over in bed (including adjusting bedclothes, sheets and blankets)?: Unable Difficulty moving from lying on back  to sitting on the side of the bed? : Unable Difficulty sitting down on and standing up from a chair with arms (e.g., wheelchair, bedside commode, etc,.)?: Unable Help needed moving to and from a bed to chair (including a wheelchair)?: A Little Help needed walking in hospital room?: A Lot Help needed climbing 3-5 steps with a railing? : Total 6 Click Score: 9    End of Session Equipment Utilized During Treatment: Gait belt Activity Tolerance: Patient limited by fatigue Patient left: in chair;with call bell/phone within reach;with chair alarm set;Other (comment)(with SLP) Nurse Communication: Mobility status PT Visit Diagnosis: Other abnormalities of gait and mobility (R26.89);Muscle weakness (generalized) (M62.81)    Time: 8768-1157 PT Time Calculation (min) (ACUTE ONLY): 29 min   Charges:   PT Evaluation $PT Eval Moderate Complexity: 1 Mod PT Treatments $Therapeutic Activity: 8-22 mins   PT G Codes:       Mabeline Caras, PT, DPT Acute Rehab Services  Pager: [facility] 11/27/2017, 10:27 AM

## 2017-11-27 NOTE — Progress Notes (Signed)
Hypoglycemic Event  CBG: 66  Treatment: D50 IV 25 mL  Symptoms: None  Follow-up CBG: Time:0250 CBG Result:118  Possible Reasons for Event: Inadequate meal intake  Comments/MD notified:per protocol    Dillard Essex

## 2017-11-27 NOTE — Progress Notes (Signed)
  Speech Language Pathology Treatment: Dysphagia  Patient Details Name: Brad Turner MRN: [id number] DOB: 1941/05/01 Today's Date: 11/27/2017 Time: 7741-2878 SLP Time Calculation (min) (ACUTE ONLY): 24 min  Assessment / Plan / Recommendation Clinical Impression  Pt alert, hungry and denies odonophagia today. Mild hesitation/delay with water, no cough or wet vocal quality. Subtle throat clear not concerning for significant pharyngeal deficits. Vocal quality clear with adequate intensity after only 2 [place] intubation. Reported having difficulty swallowing steak once; has GERD. Recommend initiate Dys 3 (mech soft) and thin liquids, straws allowed although he prefers cups, pills whole in applesauce, slow rate and ST to follow.     HPI HPI: VERDIS KOVAL a 77 y.o.malewith medical history significantfor alcohol abuse, recent [place] gammopathy with ST elevation MI including cardiac cath with stent,hypertension, COPD, tobacco use, serious CVA, admitted with abdominal pain and shortness of breath. Initial evaluation reveals sepsis likely related to healthcare associated pneumonia. CXR 3/24 revealed CXR reveals persistent severe consolidation of entire LLL with a significant remaining amount of pleural fluid which appears loculated, VATS 3/25 for empyema, intubated 3/25-3/26. Repeat CXR 3/26 Increase conspicuity of bilateral pleural effusions. Increased interstitial density at the throughout the left lung worrisome for progressive pneumonia.      SLP Plan  Continue with current plan of care;Goals updated       Recommendations  Diet recommendations: Dysphagia 3 (mechanical soft);Thin liquid Liquids provided via: Cup;Straw Medication Administration: Whole meds with puree Supervision: Patient able to self feed;Intermittent supervision to cue for compensatory strategies Compensations: Slow rate;Small sips/bites Postural Changes and/or Swallow Maneuvers: Seated upright 90 degrees;Upright 30-60 min  after meal                Oral Care Recommendations: Oral care BID Follow up Recommendations: Other (comment)(TBD) SLP Visit Diagnosis: Dysphagia, unspecified (R13.10) Plan: Continue with current plan of care;Goals updated       GO                Houston Siren 11/27/2017, 10:40 AM   Orbie Pyo Colvin Caroli.Ed [employer] [phone]

## 2017-11-27 NOTE — Progress Notes (Addendum)
Pt had 18 beat run vtach, BP-165/103, pt alert but confused(as previous assessment).  Most recent Labs(3/27) WNL.  Morning labs pending. Will continue to monitor pt.

## 2017-11-27 NOTE — Significant Event (Addendum)
Received orders for electrolytes replacement, PRN labetalol, and one time order for lasix 40mg  from [facility]. Also, received verbal order that patient can transfer to stepdown. RN to enter these orders in Epic per PA. CCM Rahul ensured  that hospitalist service have been consulted and will round on patient tomorrow.     Brad Turner

## 2017-11-27 NOTE — Progress Notes (Signed)
Patient ID: Brad Turner, male   DOB: May 30, 1941, 77 y.o.   MRN: [id number] EVENING ROUNDS NOTE :     [name]       [place] [address]             [phone]                 3 Days Post-Op Procedure(s) (LRB): VIDEO ASSISTED THORACOSCOPY (VATS)/EMPYEMA (Left) VIDEO BRONCHOSCOPY (N/A) CHEST EXPLORATION (Left)  Total Length of Stay:  LOS: 6 days  BP 117/78 (BP Location: Right Arm)   Pulse 99   Temp (!) 97.1 F (36.2 C) (Oral)   Resp 18   Ht 5\' 5"  (1.651 m)   Wt 146 lb 13.2 oz (66.6 kg)   SpO2 95%   BMI 24.43 kg/m   .Intake/Output      03/28 0701 - 03/29 0700   P.O. 480   I.V. (mL/kg) 120 (1.8)   Other    IV Piggyback 250   Total Intake(mL/kg) 850 (12.8)   Urine (mL/kg/hr) 1000 (1.2)   Chest Tube 100   Total Output 1100   Net -250         . 0.9 % NaCl with KCl 20 mEq / L 10 mL/hr at 11/27/17 1900  . phenylephrine (NEO-SYNEPHRINE) Adult infusion    . piperacillin-tazobactam (ZOSYN)  IV 3.375 g (11/27/17 1753)  . potassium chloride       Lab Results  Component Value Date   WBC 11.7 (H) 11/27/2017   HGB 8.2 (L) 11/27/2017   HCT 24.7 (L) 11/27/2017   PLT 336 11/27/2017   GLUCOSE 80 11/27/2017   CHOL 131 09/14/2017   TRIG 76 11/24/2017   HDL 84 09/14/2017   LDLCALC 38 09/14/2017   ALT 17 11/26/2017   AST 35 11/26/2017   NA 136 11/27/2017   K 4.2 11/27/2017   CL 108 11/27/2017   CREATININE 0.90 11/27/2017   BUN 8 11/27/2017   CO2 21 (L) 11/27/2017   TSH 2.282 09/21/2017   INR 1.40 11/24/2017   Patient up in chair mildly confused, chest tube to waterseal minimal drainage today Patient slowly improving walked approximately 5 feet today PT and OT have been asked to see the patient most likely will need rehab long-term No word from the medical service or CCM or cardiology concerning question raised this morning about aspirin and/or Plavix   [name] MD  Beeper [phone] Office [phone] 11/27/2017 7:56 PM

## 2017-11-27 NOTE — Progress Notes (Signed)
PULMONARY / CRITICAL CARE MEDICINE   Name: Marqus Macphee Hancox MRN: [id number] DOB: 07-27-1941    ADMISSION DATE:  11/21/2017 CONSULTATION DATE:  11/23/2017  REFERRING MD:  [place]ville Silos  CHIEF COMPLAINT:  Shortness of breath  HISTORY OF PRESENT ILLNESS:   77 y/o male with a history of COPD, EtOH use, CAD and GERD here with a large left complicated pleural effusion who went for a VATS drainage 3/25.     SUBJECTIVE:  Extubated 3/26. Has tolerated well. Up in chair with no complaints. Would like to have chest tube removed.    VITAL SIGNS: BP 117/78 (BP Location: Right Arm)   Pulse 99   Temp (!) 97.1 F (36.2 C) (Oral)   Resp 18   Ht 5\' 5"  (1.651 m)   Wt 66.6 kg (146 lb 13.2 oz)   SpO2 95%   BMI 24.43 kg/m   HEMODYNAMICS:    VENTILATOR SETTINGS: FiO2 (%):  [28 %] 28 %  INTAKE / OUTPUT: I/O last 3 completed shifts: In: 2074 [P.O.:480; I.V.:894; Other:50; IV Piggyback:650] Out: 2765 [Urine:2475; Chest Tube:290]  PHYSICAL EXAMINATION: General:  Elderly male, in NAD. In bedside chair Neuro:  A&O x 3, no deficits. Very pleasant HEENT:  [place]/AT, No JVD noted, EOMI Cardiovascular:  RRR, no MRG Lungs: Coarse bilaterally L>R. Left chest tube in place, no air leak, draining small volume serosanguinous Abdomen:  Soft, non-distended Musculoskeletal:  Edematous upper extremities. Skin:  Intact, MMM  LABS:  BMET Recent Labs  Lab 11/25/17 0345 11/26/17 0301 11/27/17 0220  NA 134* 135 136  K 4.1 4.3 4.2  CL 106 109 108  CO2 20* 19* 21*  BUN 11 9 8   CREATININE 0.98 0.83 0.90  GLUCOSE 116* 85 80    Electrolytes Recent Labs  Lab 11/23/17 0226 11/24/17 0225  11/25/17 0345 11/26/17 0301 11/27/17 0220  CALCIUM 7.1* 7.4*   < > 8.4* 7.7* 8.0*  MG 2.1 1.9  --   --   --  1.5*  PHOS  --   --   --   --   --  2.4*   < > = values in this interval not displayed.    CBC Recent Labs  Lab 11/25/17 0345 11/26/17 0301 11/27/17 0220  WBC 13.8* 16.8* 11.7*  HGB 8.5* 9.3* 8.2*  HCT  24.1* 27.1* 24.7*  PLT 221 314 336    Coag's Recent Labs  Lab 11/23/17 1806 11/24/17 1500 11/24/17 1807  APTT 44* 27 29  INR 1.17 1.28 1.40    Sepsis Markers Recent Labs  Lab 11/21/17 2205 11/24/17 1756 11/24/17 2145  LATICACIDVEN 1.5 0.8 0.7    ABG Recent Labs  Lab 11/24/17 1626 11/24/17 1750 11/25/17 0356  PHART 7.325* 7.341* 7.363  PCO2ART 38.0 34.6 35.1  PO2ART 117.0* 137.0* 116*    Liver Enzymes Recent Labs  Lab 11/22/17 0658 11/23/17 0226 11/26/17 0301  AST 23 27 35  ALT 15* 14* 17  ALKPHOS 56 56 53  BILITOT 0.6 0.8 1.0  ALBUMIN 1.7* 1.6* 2.1*    Cardiac Enzymes No results for input(s): TROPONINI, PROBNP in the last 168 hours.  Glucose Recent Labs  Lab 11/27/17 0224 11/27/17 0250 11/27/17 0604 11/27/17 0741 11/27/17 1145 11/27/17 1903  GLUCAP 66 118* 80 75 85 138*    Imaging Dg Chest Port 1 View  Result Date: 11/27/2017 CLINICAL DATA:  Status post LEFT VATS for empyema. EXAM: PORTABLE CHEST 1 VIEW COMPARISON:  11/25/2017 and prior radiographs FINDINGS: An endotracheal tube and NG tube  have been removed. A RIGHT IJ central venous catheter with tip overlying the mid SVC, and 2 LEFT thoracostomy tubes are unchanged. There is no definite pneumothorax. Continued LEFT LOWER lung opacity noted. Improved RIGHT basilar aeration and decreased RIGHT pleural effusion, now trace. IMPRESSION: Endotracheal tube and NG tube removal with improved RIGHT basilar aeration and decreased RIGHT pleural effusion, now trace. No other significant change.  No definite pneumothorax. Electronically Signed   By: Margarette Canada M.D.   On: 11/27/2017 08:31     STUDIES:  09/2017 Echo>  3/23 CT chest > emphysema, large left pleural effusion 3/23 left pleural fluid> WBC 2375, 90% PMN   CULTURES: 3/25 pleural bact > Neg 3/25 pleural AFB >  3/25 pleural fung >  3/23 pleural > neg 3/22 blood > neg 3/22 urine > neg  ANTIBIOTICS: 3/22 zosyn >  3/22 vanc >  3/28  SIGNIFICANT EVENTS: 3/25 > intubated 3/26 > left VATS with decortication 3/26 > extubated  LINES/TUBES: 3/25 ETT > 3/26 3/25 2 Left chest tubes >  3/24 R IJ CVC >>   DISCUSSION: 77 y/o male with empyema, now s/p VATS drainage.  At baseline he has multiple medical problems including cardiomyopathy, COPD, and CAD.  He remained on vent immediately post VATs, but has since been extubated and tolerating well.   ASSESSMENT / PLAN:  PULMONARY A: Acute respiratory failure with hypoxemia - required intubation, now s/p extubation 3/26 HCAP Empyema s/p VATS drainage 3/25 COPD P:   Continue supplemental O2 as needed to maintain SpO2 > 92%. Bronchial hygiene. Budesonide nebs Brovana nebs Atrovent nebs Chest tube per CVTS. Continue abx. Follow CXR.  CARDIOVASCULAR A:  Baseline cardiomyopathy, chronic systolic heart failure Chronic hypotension P:  Midodrine on hold   Continue to hold plavix, aspirin.   RENAL A:   AKI - resolved. Hypomag Hypophos P:   Follow BMP, UOP Supp Mag Supp phos  GASTROINTESTINAL A:   No acute issues P:   PPI for SUP Dys 3 Diet  HEMATOLOGIC A: Anemia, underwent re-exploration post-op for bleeder, improved P:   Transfuse for Hgb < 7 Follow CBC  INFECTIOUS A:   Empyema - s/p VATS decortication 3/26 P:   vanco + zosyn since 3/22 - DC vanomycin Follow cx data  ENDOCRINE A:   No acute issues P:   Follow glucose  NEUROLOGIC A:   No acute events P:   No interventions required  FAMILY  - Updates:   - Inter-disciplinary family meet or Palliative Care meeting due by:  4/2   Have discussed with hospitalist team, they will assume care in the AM.   Georgann Housekeeper, AGACNP-BC [facility] Pulmonology/Critical Care Pager [phone] or [phone]  11/27/2017 9:17 PM

## 2017-11-27 NOTE — NC FL2 (Signed)
[place] LEVEL OF CARE SCREENING TOOL     IDENTIFICATION  Patient Name: Brad Turner Birthdate: March 15, 1941 Sex: male Admission Date (Current Location): 11/21/2017  [facility] and [place] Number:  Herbalist and Address:  [facility], [place] [address], [place], [place] [address]      Provider Number: [id number]  Attending Physician Name and Address:  Eugenie Filler, MD  Relative Name and Phone Number:       Current Level of Care: Hospital Recommended Level of Care: [facility] Prior Approval Number:    Date Approved/Denied:   PASRR Number: [id number]  Discharge Plan: SNF    Current Diagnoses: Patient Active Problem List   Diagnosis Date Noted  . Empyema of left pleural space ([place]) 11/24/2017  . Empyema, left ([place]) 11/23/2017  . Iron deficiency anemia 11/22/2017  . Acute urinary retention 11/22/2017  . Hypokalemia   . Hypomagnesemia   . Parapneumonic effusion   . Sepsis due to pneumonia ([place])   . ETOH abuse 11/21/2017  . Acute respiratory failure ([place]) 11/21/2017  . HCAP (healthcare-associated pneumonia) 11/21/2017  . Sepsis ([place]) 11/21/2017  . Pleural effusion 11/21/2017  . Acute kidney injury ([place]) 11/21/2017  . Abdominal pain 11/21/2017  . Malnutrition of moderate degree 09/24/2017  . Acute delirium 09/16/2017  . Acute hyponatremia 09/16/2017  . Alcohol withdrawal ([place]) 09/16/2017  . 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 Tobacco abuse 09/16/2017  . Depression 09/16/2017  . History of CVA (cerebrovascular accident) 09/16/2017  . History of Barrett's esophagus 09/16/2017  . Takotsubo cardiomyopathy 09/15/2017  . Chronic back pain 12/01/2016  . S/P lumbar spinal fusion 12/01/2016  . Dysthymia 12/01/2016  . Radiculopathy 10/17/2016  . COPD mixed type ([place]) 06/10/2016  . Abnormal CT of the chest 06/10/2016  . Dysphagia   . Cigarette smoker 06/07/2015  .  Essential hypertension, benign 10/30/2014    Class: Chronic  . Syncope 10/29/2014  . Cough syncope 10/07/2014  . COPD with acute exacerbation ([place]) 09/29/2014  . Pulmonary nodules 11/22/2013  . Chest pain, unspecified 10/15/2013  . Personal history of colonic polyps 06/04/2010  . COPD (chronic obstructive pulmonary disease) with emphysema ([place]) 02/26/2010  . CHEST PAIN, ATYPICAL 02/26/2010  . BARRETTS ESOPHAGUS 05/18/2009  . HYPERLIPIDEMIA 04/28/2008  . Coronary atherosclerosis 04/28/2008  . CVA 04/28/2008  . GERD 04/28/2008  . ARTHRITIS 04/28/2008  . NEPHROLITHIASIS, HX OF 04/28/2008    Orientation RESPIRATION BLADDER Height & Weight     Self, Place  Normal Incontinent, External catheter Weight: 146 lb 13.2 oz (66.6 kg) Height:  5\' 5"  (165.1 cm)  BEHAVIORAL SYMPTOMS/MOOD NEUROLOGICAL BOWEL NUTRITION STATUS      Continent Diet(see DC summary)  AMBULATORY STATUS COMMUNICATION OF NEEDS Skin   Extensive Assist Verbally Surgical wounds                       Personal Care Assistance Level of Assistance  Bathing, Dressing Bathing Assistance: Maximum assistance   Dressing Assistance: Maximum assistance     Functional Limitations Info             SPECIAL CARE FACTORS FREQUENCY  PT (By licensed PT), OT (By licensed OT)     PT Frequency: 5/wk OT Frequency: 5/wk            Contractures      Additional Factors Info  Code Status, Allergies, Insulin Sliding Scale, Psychotropic Code Status Info: FULL  Allergies Info: Ivp Dye Iodinated Diagnostic Agents, Macrodantin Nitrofurantoin Psychotropic Info: cymbalta, seroquel Insulin Sliding Scale Info: 4/[place]       Current Medications (11/27/2017):  This is the current hospital active medication list Current Facility-Administered Medications  Medication Dose Route Frequency Provider Last Rate Last Dose  . 0.9 % NaCl with KCl 20 mEq/ L  infusion   Intravenous Continuous Barrett, Erin R, PA-C 10 mL/hr at 11/27/17 1000     . acetaminophen (TYLENOL) tablet 1,000 mg  1,000 mg Oral Q6H Zimmerman, Donielle M, PA-C       Or  . acetaminophen (TYLENOL) solution 1,000 mg  1,000 mg Oral Q6H Lars Pinks M, PA-C   1,000 mg at 11/25/17 0514  . arformoterol (BROVANA) nebulizer solution 15 mcg  15 mcg Nebulization BID Lars Pinks M, PA-C   15 mcg at 11/27/17 5643  . atorvastatin (LIPITOR) tablet 80 mg  80 mg Oral Daily Lars Pinks M, PA-C   80 mg at 11/23/17 0849  . bisacodyl (DULCOLAX) EC tablet 10 mg  10 mg Oral Daily Lars Pinks M, PA-C      . budesonide (PULMICORT) nebulizer solution 0.5 mg  0.5 mg Nebulization BID Lars Pinks M, PA-C   0.5 mg at 11/27/17 3295  . chlorhexidine (PERIDEX) 0.12 % solution 15 mL  15 mL Mouth Rinse BID Collene Gobble, MD   15 mL at 11/26/17 2209  . Chlorhexidine Gluconate Cloth 2 % PADS 6 each  6 each Topical Daily [name], MD   6 each at 11/26/17 1400  . DULoxetine (CYMBALTA) DR capsule 60 mg  60 mg Oral Daily Lars Pinks M, PA-C   60 mg at 11/23/17 1884  . feeding supplement (BOOST / RESOURCE BREEZE) liquid 1 Container  1 Container Oral TID BM Nani Skillern, PA-C   1 Container at 11/23/17 [phone]  . fentaNYL (SUBLIMAZE) injection 50 mcg  50 mcg Intravenous Q2H PRN Gaye Pollack, MD   50 mcg at 11/27/17 0325  . guaiFENesin (MUCINEX) 12 hr tablet 1,200 mg  1,200 mg Oral BID Lars Pinks M, PA-C   1,200 mg at 11/24/17 2121  . hydrALAZINE (APRESOLINE) injection 10-20 mg  10-20 mg Intravenous Q4H PRN Wilhelmina Mcardle, MD   10 mg at 11/27/17 0617  . insulin aspart (novoLOG) injection 0-24 Units  0-24 Units Subcutaneous Q6H Zimmerman, Donielle M, PA-C      . ipratropium (ATROVENT) nebulizer solution 0.5 mg  0.5 mg Nebulization Q6H PRN Milly Jakob, MD      . levalbuterol Penne Lash) nebulizer solution 0.63 mg  0.63 mg Nebulization Q6H PRN Milly Jakob, MD      . loperamide (IMODIUM) capsule 2-4 mg  2-4 mg Oral PRN Lars Pinks M, PA-C      . loratadine (CLARITIN) tablet 10 mg  10 mg Oral Daily Lars Pinks M, PA-C   10 mg at 11/23/17 0851  . MEDLINE mouth rinse  15 mL Mouth Rinse q12n4p Collene Gobble, MD   15 mL at 11/26/17 1600  . mirabegron ER (MYRBETRIQ) tablet 50 mg  50 mg Oral Daily Lars Pinks M, PA-C   50 mg at 11/23/17 6301  . nicotine (NICODERM CQ - dosed in mg/24 hours) patch 21 mg  21 mg Transdermal Daily Lars Pinks M, PA-C   21 mg at 11/26/17 0954  . ondansetron (ZOFRAN) injection 4 mg  4 mg Intravenous Q6H PRN Lars Pinks M, PA-C      . pantoprazole (PROTONIX) EC  tablet 40 mg  40 mg Oral Q0600 Nani Skillern, PA-C   40 mg at 11/25/17 0514  . phenylephrine (NEO-SYNEPHRINE) 10 mg in sodium chloride 0.9 % 250 mL (0.04 mg/mL) infusion  0-30 mcg/min Intravenous Titrated Prescott Gum, Collier Salina, MD      . piperacillin-tazobactam (ZOSYN) IVPB 3.375 g  3.375 g Intravenous Q8H Lars Pinks M, [place]   Stopped at 11/27/17 0488  . potassium chloride 10 mEq in 50 mL *CENTRAL LINE* IVPB  10 mEq Intravenous Daily PRN Lars Pinks M, PA-C      . QUEtiapine (SEROQUEL) tablet 25 mg  25 mg Oral QHS Lars Pinks M, PA-C   25 mg at 11/24/17 2121  . senna-docusate (Senokot-S) tablet 1 tablet  1 tablet Oral QHS Nani Skillern, PA-C   1 tablet at 11/24/17 2121  . sodium chloride flush (NS) 0.9 % injection 10-40 mL  10-40 mL Intracatheter Q12H [name], MD   10 mL at 11/26/17 2208  . sodium chloride flush (NS) 0.9 % injection 10-40 mL  10-40 mL Intracatheter PRN [name], MD   10 mL at 11/25/17 1348  . sorbitol 70 % solution 30 mL  30 mL Oral Daily PRN Lars Pinks M, PA-C      . vancomycin (VANCOCIN) 500 mg in sodium chloride 0.9 % 100 mL IVPB  500 mg Intravenous Q12H Kris Mouton, [facility]   Stopped at 11/27/17 8916     Discharge Medications: Please see discharge summary for a list of discharge medications.  Relevant Imaging  Results:  Relevant Lab Results:   Additional Information SS#: [id number]  Jorge Ny, LCSW

## 2017-11-28 ENCOUNTER — Inpatient Hospital Stay (HOSPITAL_COMMUNITY): Payer: Medicare Other

## 2017-11-28 LAB — CBC
HCT: 23.2 % — ABNORMAL LOW (ref 39.0–52.0)
HEMOGLOBIN: 7.7 g/dL — AB (ref 13.0–17.0)
MCH: 29.3 pg (ref 26.0–34.0)
MCHC: 33.2 g/dL (ref 30.0–36.0)
MCV: 88.2 fL (ref 78.0–100.0)
Platelets: 355 10*3/uL (ref 150–400)
RBC: 2.63 MIL/uL — AB (ref 4.22–5.81)
RDW: 15.6 % — ABNORMAL HIGH (ref 11.5–15.5)
WBC: 8.3 10*3/uL (ref 4.0–10.5)

## 2017-11-28 LAB — BASIC METABOLIC PANEL
ANION GAP: 7 (ref 5–15)
BUN: 8 mg/dL (ref 6–20)
CALCIUM: 7.9 mg/dL — AB (ref 8.9–10.3)
CO2: 23 mmol/L (ref 22–32)
Chloride: 106 mmol/L (ref 101–111)
Creatinine, Ser: 0.84 mg/dL (ref 0.61–1.24)
Glucose, Bld: 81 mg/dL (ref 65–99)
Potassium: 3.1 mmol/L — ABNORMAL LOW (ref 3.5–5.1)
SODIUM: 136 mmol/L (ref 135–145)

## 2017-11-28 LAB — GLUCOSE, CAPILLARY
GLUCOSE-CAPILLARY: 138 mg/dL — AB (ref 65–99)
GLUCOSE-CAPILLARY: 82 mg/dL (ref 65–99)
Glucose-Capillary: 90 mg/dL (ref 65–99)
Glucose-Capillary: 109 mg/dL — ABNORMAL HIGH (ref 65–99)

## 2017-11-28 LAB — PHOSPHORUS: PHOSPHORUS: 3.1 mg/dL (ref 2.5–4.6)

## 2017-11-28 LAB — PREPARE RBC (CROSSMATCH)

## 2017-11-28 LAB — MAGNESIUM: MAGNESIUM: 1.8 mg/dL (ref 1.7–2.4)

## 2017-11-28 MED ORDER — ACETAMINOPHEN 80 MG PO CHEW
500.0000 mg | CHEWABLE_TABLET | ORAL | Status: DC | PRN
  Filled 2017-11-28: qty 7

## 2017-11-28 MED ORDER — FENTANYL CITRATE (PF) 100 MCG/2ML IJ SOLN: 25.0000 ug | INTRAMUSCULAR | Status: DC | PRN

## 2017-11-28 MED ORDER — METOPROLOL SUCCINATE ER 25 MG PO TB24
25.0000 mg | ORAL_TABLET | Freq: Every day | ORAL | Status: DC
  Administered 2017-11-28 – 2017-12-03 (×6): 25 mg via ORAL
  Filled 2017-11-28 (×6): qty 1

## 2017-11-28 MED ORDER — ADULT MULTIVITAMIN W/MINERALS CH
1.0000 | ORAL_TABLET | Freq: Every day | ORAL | Status: DC
  Administered 2017-11-28 – 2017-12-03 (×6): 1 via ORAL
  Filled 2017-11-28 (×5): qty 1

## 2017-11-28 MED ORDER — POTASSIUM CHLORIDE 10 MEQ/50ML IV SOLN
10.0000 meq | INTRAVENOUS | Status: AC
  Administered 2017-11-28 (×4): 10 meq via INTRAVENOUS
  Filled 2017-11-28 (×3): qty 50

## 2017-11-28 MED ORDER — SODIUM CHLORIDE 0.9 % IV SOLN
Freq: Once | INTRAVENOUS | Status: AC
  Administered 2017-11-28: 19:00:00 via INTRAVENOUS

## 2017-11-28 MED ORDER — FUROSEMIDE 10 MG/ML IJ SOLN
20.0000 mg | Freq: Once | INTRAMUSCULAR | Status: AC
Start: 2017-11-28 — End: 2017-11-28
  Administered 2017-11-28: 20 mg via INTRAVENOUS
  Filled 2017-11-28: qty 2

## 2017-11-28 MED ORDER — POTASSIUM CHLORIDE CRYS ER 20 MEQ PO TBCR
40.0000 meq | EXTENDED_RELEASE_TABLET | Freq: Every day | ORAL | Status: DC
  Administered 2017-11-28 – 2017-11-29 (×2): 40 meq via ORAL
  Filled 2017-11-28 (×2): qty 2

## 2017-11-28 MED ORDER — ACETAMINOPHEN 500 MG PO TABS
500.0000 mg | ORAL_TABLET | Freq: Four times a day (QID) | ORAL | Status: DC | PRN
  Administered 2017-11-29: 500 mg via ORAL
  Filled 2017-11-28: qty 1

## 2017-11-28 MED ORDER — FUROSEMIDE 10 MG/ML IJ SOLN
40.0000 mg | Freq: Two times a day (BID) | INTRAMUSCULAR | Status: DC
  Administered 2017-11-28 – 2017-11-30 (×5): 40 mg via INTRAVENOUS
  Filled 2017-11-28 (×5): qty 4

## 2017-11-28 MED ORDER — CLOPIDOGREL BISULFATE 75 MG PO TABS
75.0000 mg | ORAL_TABLET | Freq: Every day | ORAL | Status: DC
  Administered 2017-11-28 – 2017-12-03 (×6): 75 mg via ORAL
  Filled 2017-11-28 (×6): qty 1

## 2017-11-28 MED ORDER — ENSURE ENLIVE PO LIQD
237.0000 mL | Freq: Three times a day (TID) | ORAL | Status: DC
  Administered 2017-11-28 – 2017-12-03 (×6): 237 mL via ORAL

## 2017-11-28 NOTE — Progress Notes (Signed)
Nutrition Follow-up  DOCUMENTATION CODES:   Non-severe (moderate) malnutrition in context of chronic illness  INTERVENTION:   Ensure Enlive po TID, each supplement provides 350 kcal and 20 grams of protein  D/C Boost Breeze  MVI daily   NUTRITION DIAGNOSIS:   Moderate Malnutrition related to chronic illness(COPD) as evidenced by energy intake < or equal to 75% for > or equal to 1 month, mild fat depletion, moderate fat depletion, mild muscle depletion, moderate muscle depletion.  Continues but being addressed as diet advanced to Regular, supplements  GOAL:   Patient will meet greater than or equal to 90% of their needs  Not Met  MONITOR:   PO intake, Supplement acceptance, Diet advancement, Labs, Weight trends, Skin, I & O's  REASON FOR ASSESSMENT:   Malnutrition Screening Tool    ASSESSMENT:   Brad Turner is a 77 y.o. male with medical history significant for alcohol abuse, recent Takotsubo gammopathy with ST elevation MI including cardiac cath with stent,hypertension, COPD, tobacco use, serious CVA, since emergency Department chief complaint abdominal pain and shortness of breath. Initial evaluation reveals sepsis likely related to healthcare associated pneumonia.  3/25 VATS with decortication 3/26 extubated  Diet upgraded from Dysphagia III to Regular today Appetite poor, 0-20% of meals. Pt reports he likes [employer] and drinks at home. RD will order Ensure Enlive TID between meals  Weight up post surgery but trending back down. Weight on admission 134 pounds, current wt 139 pounds.   Labs: reviewed Meds: lasix, K-Phos daily, KCl  Diet Order:  Diet regular Room service appropriate? Yes; Fluid consistency: Thin  EDUCATION NEEDS:   Education needs have been addressed  Skin:  Skin Assessment: Reviewed RN Assessment  Last BM:  11/22/17  Height:   Ht Readings from Last 1 Encounters:  11/24/17 '5\' 5"'  (1.651 m)    Weight:   Wt Readings from Last 1  Encounters:  11/28/17 139 lb 4.8 oz (63.2 kg)    Ideal Body Weight:  61.8 kg  BMI:  Body mass index is 23.18 kg/m.  Estimated Nutritional Needs:   Kcal:  1800-2000  Protein:  100-115 grams  Fluid:  1.8-2.0 L    Kerman Passey MS, RD, LDN, CNSC [phone] Pager  [phone] Weekend/On-Call Pager

## 2017-11-28 NOTE — Progress Notes (Signed)
[facility] ADULT ICU REPLACEMENT PROTOCOL FOR AM LAB REPLACEMENT ONLY  The patient does apply for the [facility] Adult ICU Electrolyte Replacment Protocol based on the criteria listed below:   1. Is GFR >/= 40 ml/min? Yes.    Patient's GFR today is >60 2. Is urine output >/= 0.5 ml/kg/hr for the last 6 hours? Yes.   Patient's UOP is 1.0 ml/kg/hr 3. Is BUN < 60 mg/dL? Yes.    Patient's BUN today is 8 4. Abnormal electrolyte(s): K -3.1 5. Ordered repletion with: per protocol 6. If a panic level lab has been reported, has the CCM MD in charge been notified? Yes.  .   Physician:  Dr. Harlene Ramus, Philis Nettle 11/28/2017 5:37 AM

## 2017-11-28 NOTE — Plan of Care (Signed)
Pt is working with PT to help w/ activity. Pt is currently on Room Air and uses Chiropodist appropriately. Pt reports lower pain levels and is being turned Q2 to prevent skin breakdown while in bed.

## 2017-11-28 NOTE — Progress Notes (Signed)
Physical Therapy Treatment Patient Details Name: Sahil Milner Foutz MRN: [id number] DOB: 29-Mar-1941 Today's Date: 11/28/2017    History of Present Illness Pt is a 77 y.o. male admitted 11/21/17 with acute respiratory failure secondary to HAP and pleural effusion in setting of COPD. Also with sepsis. Now s/p VATS/empyema and video bronchoscopy on 3/25. PMH includes HTN, COPD, chronic back pain, CAD, depression, neuropathy, macular degeneration, CVA (1995), lumbar laminectomy (2016).   PT Comments    Pt progressing with mobility. Able to amb 25' x2 bouts with seated rest break in between, using RW and intermittent minA for balance, progressing to min guard. Pt easily fatigued, but also seems to be self-limiting his mobility. Max encouragement for participation with PT this session. Continue to recommend SNF-level therapies at d/c. Will follow acutely.   Follow Up Recommendations  SNF;Supervision/Assistance - 24 hour     Equipment Recommendations  (TBD next venue)    Recommendations for Other Services OT consult     Precautions / Restrictions Precautions Precautions: Fall Precaution Comments: central line, chest tube Restrictions Weight Bearing Restrictions: No    Mobility  Bed Mobility               General bed mobility comments: Received sitting in recliner  Transfers Overall transfer level: Needs assistance Equipment used: Rolling walker (2 wheeled) Transfers: Sit to/from Stand Sit to Stand: Min assist         General transfer comment: Pt stood 2x from recliner and chair with RW and minA for balance. Required repeated cues every time to push with BUEs from arm rest instead of pulling up on RW. Better ability to maintain upright posture this session. Poor safety awareness with uncontrolled eccentric lowering to chair  Ambulation/Gait Ambulation/Gait assistance: Min assist;Min guard Ambulation Distance (Feet): 25 Feet(+25) Assistive device: Rolling walker (2  wheeled) Gait Pattern/deviations: Step-to pattern;Antalgic Gait velocity: Decreased Gait velocity interpretation: <1.8 ft/sec, indicative of risk for recurrent falls General Gait Details: Slow, antalgic amb with RW and initial minA for balance, progressing to min guard; walked 25' x2 with extended seated rest break secondary to fatigue   Stairs            Wheelchair Mobility    Modified Rankin (Stroke Patients Only)       Balance Overall balance assessment: Needs assistance   Sitting balance-Leahy Scale: Fair       Standing balance-Leahy Scale: Poor Standing balance comment: Reliant on UE support                            Cognition Arousal/Alertness: Awake/alert Behavior During Therapy: WFL for tasks assessed/performed Overall Cognitive Status: No family/caregiver present to determine baseline cognitive functioning Area of Impairment: Attention;Memory;Following commands;Safety/judgement;Awareness;Problem solving                   Current Attention Level: Sustained Memory: Decreased short-term memory Following Commands: Follows multi-step commands with increased time Safety/Judgement: Decreased awareness of deficits;Decreased awareness of safety Awareness: Emergent Problem Solving: Slow processing;Difficulty sequencing;Requires verbal cues;Requires tactile cues        Exercises      General Comments General comments (skin integrity, edema, etc.): Resting (seated) BP 107/89, SpO2 >93% on RA throughout, HR 96      Pertinent Vitals/Pain Pain Assessment: Faces Faces Pain Scale: Hurts a little bit Pain Location: Chest tube insertion site Pain Descriptors / Indicators: Sore Pain Intervention(s): Monitored during session    Home Living  Prior Function            PT Goals (current goals can now be found in the care plan section) Acute Rehab PT Goals Patient Stated Goal: Return home PT Goal Formulation: With  patient Time For Goal Achievement: 12/11/17 Potential to Achieve Goals: Fair Progress towards PT goals: Progressing toward goals    Frequency    Min 2X/week      PT Plan Discharge plan needs to be updated    Co-evaluation              AM-PAC PT "6 Clicks" Daily Activity  Outcome Measure  Difficulty turning over in bed (including adjusting bedclothes, sheets and blankets)?: Unable Difficulty moving from lying on back to sitting on the side of the bed? : Unable Difficulty sitting down on and standing up from a chair with arms (e.g., wheelchair, bedside commode, etc,.)?: Unable Help needed moving to and from a bed to chair (including a wheelchair)?: A Little Help needed walking in hospital room?: A Little Help needed climbing 3-5 steps with a railing? : Total 6 Click Score: 10    End of Session Equipment Utilized During Treatment: Gait belt Activity Tolerance: Patient tolerated treatment well;Patient limited by fatigue Patient left: in chair;with call bell/phone within reach Nurse Communication: Mobility status PT Visit Diagnosis: Other abnormalities of gait and mobility (R26.89);Muscle weakness (generalized) (M62.81)     Time: 2035-5974 PT Time Calculation (min) (ACUTE ONLY): 24 min  Charges:  $Gait Training: 8-22 mins $Therapeutic Activity: 8-22 mins                    G Codes:      Mabeline Caras, PT, DPT Acute Rehab Services  Pager: [place] 11/28/2017, 5:24 PM

## 2017-11-28 NOTE — Progress Notes (Addendum)
T[facility] DAILY ICU PROGRESS NOTE                   [address]            [place] [address]          [phone]   4 Days Post-Op Procedure(s) (LRB): VIDEO ASSISTED THORACOSCOPY (VATS)/EMPYEMA (Left) VIDEO BRONCHOSCOPY (N/A) CHEST EXPLORATION (Left)  Total Length of Stay:  LOS: 7 days   Subjective:  No new complaints.  Sleepy this morning.  Started working with PT wasn't able to ambulate very far.  Objective: Vital signs in last 24 hours: Temp:  [97.1 F (36.2 C)-98.6 F (37 C)] 98.6 F (37 C) (03/29 0431) Pulse Rate:  [84-115] 96 (03/29 0700) Cardiac Rhythm: Normal sinus rhythm (03/29 0630) Resp:  [11-22] 19 (03/29 0700) BP: (117-165)/(68-107) 149/89 (03/29 0700) SpO2:  [95 %-97 %] 96 % (03/29 0700) FiO2 (%):  [28 %] 28 % (03/28 0828) Weight:  [139 lb 4.8 oz (63.2 kg)] 139 lb 4.8 oz (63.2 kg) (03/29 0500)  Filed Weights   11/25/17 0326 11/26/17 0500 11/28/17 0500  Weight: 147 lb 0.8 oz (66.7 kg) 146 lb 13.2 oz (66.6 kg) 139 lb 4.8 oz (63.2 kg)    Weight change:    Intake/Output from previous [place]: 03/28 0701 - 03/29 0700 In: 1160 [P.O.:580; I.V.:230; IV Piggyback:350] Out: 2950 [Urine:2750; Chest Tube:200]  Current Meds: Scheduled Meds: . arformoterol  15 mcg Nebulization BID  . atorvastatin  80 mg Oral Daily  . bisacodyl  10 mg Oral Daily  . budesonide (PULMICORT) nebulizer solution  0.5 mg Nebulization BID  . chlorhexidine  15 mL Mouth Rinse BID  . Chlorhexidine Gluconate Cloth  6 each Topical Daily  . DULoxetine  60 mg Oral Daily  . feeding supplement  1 Container Oral TID BM  . guaiFENesin  1,200 mg Oral BID  . insulin aspart  0-24 Units Subcutaneous Q6H  . loratadine  10 mg Oral Daily  . mouth rinse  15 mL Mouth Rinse q12n4p  . mirabegron ER  50 mg Oral Daily  . nicotine  21 mg Transdermal Daily  . pantoprazole  40 mg Oral Q0600  . phosphorus  500 mg Oral BID  . QUEtiapine  25 mg Oral QHS  . senna-docusate  1 tablet Oral QHS  .  sodium chloride flush  10-40 mL Intracatheter Q12H   Continuous Infusions: . 0.9 % NaCl with KCl 20 mEq / L 10 mL/hr at 11/28/17 0600  . piperacillin-tazobactam (ZOSYN)  IV Stopped (11/28/17 9147)  . potassium chloride    . potassium chloride 10 mEq (11/28/17 0726)   PRN Meds:.fentaNYL (SUBLIMAZE) injection, hydrALAZINE, HYDROcodone-acetaminophen, ibuprofen, ipratropium, labetalol, levalbuterol, loperamide, ondansetron (ZOFRAN) IV, potassium chloride, sodium chloride flush, sorbitol  General appearance: alert, cooperative and no distress Heart: regular rate and rhythm Lungs: diminished breath sounds LLL Abdomen: soft, non-tender; bowel sounds normal; no masses,  no organomegaly Extremities: extremities normal, atraumatic, no cyanosis or edema Wound: clean and dry  Lab Results: CBC: Recent Labs    11/27/17 0220 11/28/17 0415  WBC 11.7* 8.3  HGB 8.2* 7.7*  HCT 24.7* 23.2*  PLT 336 355   BMET:  Recent Labs    11/27/17 0220 11/28/17 0415  NA 136 136  K 4.2 3.1*  CL 108 106  CO2 21* 23  GLUCOSE 80 81  BUN 8 8  CREATININE 0.90 0.84  CALCIUM 8.0* 7.9*    CMET: Lab Results  Component Value  Date   WBC 8.3 11/28/2017   HGB 7.7 (L) 11/28/2017   HCT 23.2 (L) 11/28/2017   PLT 355 11/28/2017   GLUCOSE 81 11/28/2017   CHOL 131 09/14/2017   TRIG 76 11/24/2017   HDL 84 09/14/2017   LDLCALC 38 09/14/2017   ALT 17 11/26/2017   AST 35 11/26/2017   NA 136 11/28/2017   K 3.1 (L) 11/28/2017   CL 106 11/28/2017   CREATININE 0.84 11/28/2017   BUN 8 11/28/2017   CO2 23 11/28/2017   TSH 2.282 09/21/2017   INR 1.40 11/24/2017      PT/INR: No results for input(s): LABPROT, INR in the last 72 hours. Radiology: No results found.   Assessment/Plan: S/P Procedure(s) (LRB): VIDEO ASSISTED THORACOSCOPY (VATS)/EMPYEMA (Left) VIDEO BRONCHOSCOPY (N/A) CHEST EXPLORATION (Left)  1. Chest tube- no air leak present, stable appearance of CXR with post surgical scarring 2. CV- Mild  Sinus Tach, remains Hypertensive- will restart home Toprol XL at reduced dose 3. Pulm- no acute issues, off oxygen, continue IS 4. Renal- creatinine remains stable 5. Hypomagnesemia- minimal improvement up to 1.8 which is WNL 6. Hypokalemia- supplementation per ICU protocol 7. Post operative blood loss anemia- Hgb at 7.7, monitor closely 8. dispo- patient stable, leave chest tubes for now, restart home Toprol at reduced dose, care per medicine   Ellwood Handler 11/28/2017 7:31 AM   Still some drainage from chest tubes- leave both chest tubes in place hgb down, could consider transfusion , no active blood loss adjustment of meds , lytes, poss transfusion  per medical  Medical service with cardiology will need to decide about plavix and asa, would caution about being on asa , Plavix and Lovenox   I have seen and examined Brad Turner and agree with the above assessment  and plan.  [name] MD Beeper [phone] Office [phone] 11/28/2017 9:47 AM

## 2017-11-28 NOTE — Progress Notes (Signed)
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  Cough syncope-not thought to be this is secondary to pulmonology], dyslipidemia, HTN, HLD, former smoker-COPD Gold 2 1.5 pack/[place]-severe emphysema, prior lumbar interbody fusion L4-5 10/1716, Barrett's esophagus + GERD last EGD nondysplastic Barrett's, last colonoscopy 11/11 1 hyperplastic polyp, remote stroke  Was admitted by cardiology 1/13-1/17 for possible ST MI found to have Takotsubo cardiomyopathy Return to emergency room 1/19 with severe orthostasis #CPR performed at the time Admitted 3/25 with acute respiratory failure secondary to H CAP loculated effusion-needed to be intubated on 3/25 and had left VATS with decortication 3/26 and extubated subsequently  Assessment & Plan:   Acute respiratory failure with hypoxia status post extubation 8/18 Complicated empyema status post VATS 3/26 Chest x-ray per my review today 1 view 3/29 shows residual changes and also some possible fluid in the right lung. continue Zosyn-pleural fluid shows no growth times 3 days--- likely will need 2-3 weeks of antibiotics as determined by CVTS White count has progressively improved from 16 to now 8 Hold ibuprofen for pain--use percocet, fentanyl dose dropped but still q2   STEMI 2011, Takotsubo cardiomyopathy 09/2015  Chronic hypotension Orthostasis with falls Continue metoprolol 25 XL daily, continue as needed hydralazine 10-20 every 4 as needed Was given IV Lasix 40 mg 3/29 and will change this to 60 IV daily for the next 3 days and reassess resuming plavix today--no alarm signs of bleed-gi or otherwise Because he has chronic orthostasis do not think we should aggressively control his blood pressure at this juncture--continue Middrine 2.5 3 times daily  Severe  COPD mixed with emphysema still smokes 1 pack/Reny Continue NicoDerm patch, Brovana 15 twice daily, Pulmicort 0.5 twice daily, Atrovent 0.5 every 6 as needed, continue Xopenex 0.63 every 6 as needed  AK I Hypomagnesemia-currently 1.8---replacing with 2 g magnesium sulfate repeat in a.m. Hypophosphatemia-replacing with K-Phos orally twice daily Hypokalemia given 4 rounds of K Dur overnight and now getting K Dur 40 daily and reassess labs a.m.  Blood loss anemia in setting of hyperplastic polyps 2011 and Barrett's esophagus Will transfuse of another unit of blood for hemoglobin 7.7  Remote stroke h/o Restart on Plavix 75 daily now  Bipolar continue Cymbalta DR 60 daily, Seroquel 25 at bedtime  Hyperlipidemia continue atorvastatin 80 daily discharge fenofibrate 145   DVT prophylaxis: Lovenox code Status:   Full   family Communication:   Son at bedside disposition Plan: Transition out of unit and disposal as per CV TS regarding tube and other management   Consultants:   Pulmonary  CV TS  Procedures:   Decortication and VATS 3/26  Antimicrobials:   Currently on Zosyn  Subjective: Awake alert weak and frail Difficulty sitting upright-L chest tube in place but not comfortable No fever no chills no cough   Objective: Vitals:   11/28/17 0630 11/28/17 0700 11/28/17 0758 11/28/17 0800  BP: 130/72 (!) 149/89  (!) 156/96  Pulse: 93 96  95  Resp: 19 19  13   Temp:   98.2 F (36.8 C)   TempSrc:   Oral   SpO2:  96%  96%  Weight:      Height:        Intake/Output Summary (Last 24 hours) at 11/28/2017 2993 Last data filed at 11/28/2017 0800 Gross per 24  hour  Intake 1170 ml  Output 2950 ml  Net -1780 ml   Filed Weights   11/25/17 0326 11/26/17 0500 11/28/17 0500  Weight: 66.7 kg (147 lb 0.8 oz) 66.6 kg (146 lb 13.2 oz) 63.2 kg (139 lb 4.8 oz)    Examination: eomi ncat, no ict no pallor-central line in R neck area Hands and forearms edematous No rales no rhonchi Chest  tubes in place-Lung sounds crackles in bases, egophony worse on L side posterior No le edema abd soft Foley in place   Data Reviewed: I have personally reviewed following labs and imaging studies  CBC: Recent Labs  Lab 11/21/17 1000  11/23/17 0226 11/24/17 0225  11/24/17 2239 11/25/17 0345 11/26/17 0301 11/27/17 0220 11/28/17 0415  WBC 28.6*   < > 17.4* 15.7*   < > 13.2* 13.8* 16.8* 11.7* 8.3  NEUTROABS 25.1*  --  14.3* 12.6*  --   --   --   --   --   --   HGB 10.1*   < > 8.8* 8.1*   < > 9.3* 8.5* 9.3* 8.2* 7.7*  HCT 29.5*   < > 26.5* 24.9*   < > 26.8* 24.1* 27.1* 24.7* 23.2*  MCV 93.1   < > 94.3 92.9   < > 83.8 84.3 86.9 88.2 88.2  PLT 758*   < > 565* 578*   < > 221 221 314 336 355   < > = values in this interval not displayed.   Basic Metabolic Panel: Recent Labs  Lab 11/22/17 0658 11/23/17 0226 11/24/17 0225  11/24/17 1807 11/25/17 0345 11/26/17 0301 11/27/17 0220 11/28/17 0415  NA 128* 130* 129*   < > 131* 134* 135 136 136  K 3.6 3.9 3.8   < > 4.0 4.1 4.3 4.2 3.1*  CL 95* 100* 101  --  105 106 109 108 106  CO2 25 21* 21*  --  19* 20* 19* 21* 23  GLUCOSE 96 121* 90   < > 103* 116* 85 80 81  BUN 13 8 9   --  9 11 9 8 8   CREATININE 0.91 0.88 0.96  --  0.87 0.98 0.83 0.90 0.84  CALCIUM 6.8* 7.1* 7.4*  --  6.8* 8.4* 7.7* 8.0* 7.9*  MG 1.7 2.1 1.9  --   --   --   --  1.5* 1.8  PHOS  --   --   --   --   --   --   --  2.4* 3.1   < > = values in this interval not displayed.   GFR: Estimated Creatinine Clearance: 67.1 mL/min (by C-G formula based on SCr of 0.84 mg/dL). Liver Function Tests: Recent Labs  Lab 11/21/17 1000 11/22/17 0658 11/23/17 0226 11/26/17 0301  AST 40 23 27 35  ALT 26 15* 14* 17  ALKPHOS 70 56 56 53  BILITOT 1.0 0.6 0.8 1.0  PROT 7.2 5.2* 5.2* 4.8*  ALBUMIN 2.5* 1.7* 1.6* 2.1*   Recent Labs  Lab 11/21/17 1000  LIPASE 21   No results for input(s): AMMONIA in the last 168 hours. Coagulation Profile: Recent Labs  Lab 11/21/17 1109  11/23/17 1806 11/24/17 1500 11/24/17 1807  INR 1.23 1.17 1.28 1.40   Cardiac Enzymes:  Radiology Studies: Reviewed images personally in health database   Scheduled Meds: . arformoterol  15 mcg Nebulization BID  . atorvastatin  80 mg Oral Daily  . bisacodyl  10 mg Oral Daily  . budesonide (PULMICORT) nebulizer  solution  0.5 mg Nebulization BID  . chlorhexidine  15 mL Mouth Rinse BID  . Chlorhexidine Gluconate Cloth  6 each Topical Daily  . DULoxetine  60 mg Oral Daily  . feeding supplement  1 Container Oral TID BM  . guaiFENesin  1,200 mg Oral BID  . insulin aspart  0-24 Units Subcutaneous Q6H  . loratadine  10 mg Oral Daily  . mouth rinse  15 mL Mouth Rinse q12n4p  . metoprolol succinate  25 mg Oral Daily  . mirabegron ER  50 mg Oral Daily  . nicotine  21 mg Transdermal Daily  . pantoprazole  40 mg Oral Q0600  . phosphorus  500 mg Oral BID  . QUEtiapine  25 mg Oral QHS  . senna-docusate  1 tablet Oral QHS  . sodium chloride flush  10-40 mL Intracatheter Q12H   Continuous Infusions: . 0.9 % NaCl with KCl 20 mEq / L 10 mL/hr at 11/28/17 0800  . piperacillin-tazobactam (ZOSYN)  IV Stopped (11/28/17 1423)  . potassium chloride    . potassium chloride 10 mEq (11/28/17 0726)     LOS: 7 days    Time spent: [place], MD [facility]  If 7PM-7AM, please contact night-coverage [email or web address] Password [email or web address] 11/28/2017, 8:08 AM

## 2017-11-28 NOTE — Progress Notes (Signed)
  Speech Language Pathology Treatment: Dysphagia  Patient Details Name: Brad Turner MRN: [id number] DOB: [birth year] Today's Date: 11/28/2017 Time: 1200-1208 SLP Time Calculation (min) (ACUTE ONLY): 8 min  Assessment / Plan / Recommendation Clinical Impression  Pt consumed thin and regular texture (for potential upgrade) without s/s aspiration. Oral phase timely today without holding. RN reports decreased appetite. Aspiration risk decreased with less than 2 Logan intubation and now 3 days post extubation. Will upgrade to regular texture, continue thin, pills with thin. No further ST needed.   HPI HPI: Brad Turner a 77 y.o.malewith medical history significantfor alcohol abuse, recent Takotsubo gammopathy with ST elevation MI including cardiac cath with stent,hypertension, COPD, tobacco use, serious CVA, admitted with abdominal pain and shortness of breath. Initial evaluation reveals sepsis likely related to healthcare associated pneumonia. CXR 3/24 revealed CXR reveals persistent severe consolidation of entire LLL with a significant remaining amount of pleural fluid which appears loculated, VATS 3/25 for empyema, intubated 3/25-3/26. Repeat CXR 3/26 Increase conspicuity of bilateral pleural effusions. Increased interstitial density at the throughout the left lung worrisome for progressive pneumonia.      SLP Plan  All goals met;Discharge SLP treatment due to (comment)       Recommendations  Diet recommendations: Regular;Thin liquid Liquids provided via: Cup;Straw Medication Administration: Whole meds with liquid Supervision: Patient able to self feed Compensations: Slow rate;Small sips/bites Postural Changes and/or Swallow Maneuvers: Seated upright 90 degrees;Upright 30-60 min after meal                Oral Care Recommendations: Oral care BID Follow up Recommendations: None SLP Visit Diagnosis: Dysphagia, unspecified (R13.10) Plan: All goals met;Discharge SLP treatment due to  (comment)                       Brad Turner 11/28/2017, 12:08 PM   Brad Turner.Ed [employer] [phone]

## 2017-11-28 NOTE — Progress Notes (Signed)
Physical Therapy Treatment Patient Details Name: Brad Turner MRN: [id number] DOB: [birth year] Today's Date: 11/28/2017    History of Present Illness Pt is a 77 y.o. male admitted 11/21/17 with acute respiratory failure secondary to HAP and pleural effusion in setting of COPD. Also with sepsis. Now s/p VATS/empyema and video bronchoscopy on 3/25. PMH includes HTN, COPD, chronic back pain, CAD, depression, neuropathy, macular degeneration, CVA (1995), lumbar laminectomy (2016).   PT Comments    Pt seen again with assist from NT as incontinent of bowel and dependent for pericare. Able to perform repeated sit<>stands with RW and min guard for balance, with increasing standing time each trial, up to ~3 minutes. Executed correct hand placement each time without cues. Pt easily fatigued requesting to sit after ~15 sec, but able to maintain standing with encouragement. Able to transfer to [facility] with min guard and RW following this.    Follow Up Recommendations  SNF;Supervision/Assistance - 24 hour     Equipment Recommendations  (TBD value)    Recommendations for Other Services OT consult     Precautions / Restrictions Precautions Precautions: Fall Precaution Comments: central line, chest tube Restrictions Weight Bearing Restrictions: No    Mobility  Bed Mobility               General bed mobility comments: Received sitting in recliner  Transfers Overall transfer level: Needs assistance Equipment used: Rolling walker (2 wheeled) Transfers: Sit to/from Stand Sit to Stand: Min guard         General transfer comment: Pt incontinent of bowel while seated in recliner, requiring assist +2 as dependent for pericare. Performed 4x sit<>stands with RW and min guard for balance; able to maintain standing for 30 sec, progressing to 3 minutes although requesting to sit  Ambulation/Gait Ambulation/Gait assistance: Min assist;Min guard Ambulation Distance (Feet): 25 Feet(+25) Assistive  device: Rolling walker (2 wheeled) Gait Pattern/deviations: Step-to pattern;Antalgic Gait velocity: Decreased Gait velocity interpretation: <1.8 ft/sec, indicative of risk for recurrent falls General Gait Details: Slow, antalgic amb with RW and initial minA for balance, progressing to min guard; walked 25' x2 with extended seated rest break secondary to fatigue   Stairs            Wheelchair Mobility    Modified Rankin (Stroke Patients Only)       Balance Overall balance assessment: Needs assistance   Sitting balance-Leahy Scale: Fair       Standing balance-Leahy Scale: Poor Standing balance comment: Reliant on UE support. Dependent for pericare                            Cognition Arousal/Alertness: Awake/alert Behavior During Therapy: WFL for tasks assessed/performed Overall Cognitive Status: No family/caregiver present to determine baseline cognitive functioning Area of Impairment: Attention;Memory;Following commands;Safety/judgement;Awareness;Problem solving                   Current Attention Level: Sustained Memory: Decreased short-term memory Following Commands: Follows multi-step commands with increased time Safety/Judgement: Decreased awareness of deficits;Decreased awareness of safety Awareness: Emergent Problem Solving: Slow processing;Difficulty sequencing;Requires verbal cues;Requires tactile cues        Exercises      General Comments General comments (skin integrity, edema, etc.): Resting (seated) BP 107/89, SpO2 >93% on RA throughout, HR 96      Pertinent Vitals/Pain Pain Assessment: Faces Faces Pain Scale: Hurts little more Pain Location: Chest tube insertion site Pain Descriptors / Indicators: Sore Pain Intervention(s):  Monitored during session    Home Living                      Prior Function            PT Goals (current goals can now be found in the care plan section) Acute Rehab PT Goals Patient  Stated Goal: Return home PT Goal Formulation: With patient Time For Goal Achievement: 12/11/17 Potential to Achieve Goals: Fair Progress towards PT goals: Progressing toward goals    Frequency    Min 2X/week      PT Plan Current plan remains appropriate    Co-evaluation              AM-PAC PT "6 Clicks" Daily Activity  Outcome Measure  Difficulty turning over in bed (including adjusting bedclothes, sheets and blankets)?: Unable Difficulty moving from lying on back to sitting on the side of the bed? : Unable Difficulty sitting down on and standing up from a chair with arms (e.g., wheelchair, bedside commode, etc,.)?: A Little Help needed moving to and from a bed to chair (including a wheelchair)?: A Little Help needed walking in hospital room?: A Little Help needed climbing 3-5 steps with a railing? : Total 6 Click Score: 12    End of Session Equipment Utilized During Treatment: Gait belt Activity Tolerance: Patient limited by fatigue Patient left: in chair;with call bell/phone within reach;with nursing/sitter in room Nurse Communication: Mobility status PT Visit Diagnosis: Other abnormalities of gait and mobility (R26.89);Muscle weakness (generalized) (M62.81)     Time: 8416-6063 PT Time Calculation (min) (ACUTE ONLY): 22 min  Charges:  $Therapeutic Exercise: 8-22 mins                    G Codes:      Mabeline Caras, PT, DPT Acute Rehab Services  Pager: [place] 11/28/2017, 5:33 PM

## 2017-11-29 ENCOUNTER — Inpatient Hospital Stay (HOSPITAL_COMMUNITY): Payer: Medicare Other

## 2017-11-29 LAB — CBC WITH DIFFERENTIAL/PLATELET
Basophils Absolute: 0 10*3/uL (ref 0.0–0.1)
Basophils Relative: 0 %
Eosinophils Absolute: 0.2 10*3/uL (ref 0.0–0.7)
Eosinophils Relative: 2 %
HEMATOCRIT: 28.9 % — AB (ref 39.0–52.0)
HEMOGLOBIN: 9.5 g/dL — AB (ref 13.0–17.0)
LYMPHS ABS: 2.4 10*3/uL (ref 0.7–4.0)
LYMPHS PCT: 25 %
MCH: 29.4 pg (ref 26.0–34.0)
MCHC: 32.9 g/dL (ref 30.0–36.0)
MCV: 89.5 fL (ref 78.0–100.0)
MONO ABS: 1.1 10*3/uL — AB (ref 0.1–1.0)
MONOS PCT: 11 %
NEUTROS ABS: 5.7 10*3/uL (ref 1.7–7.7)
NEUTROS PCT: 62 %
Platelets: 376 10*3/uL (ref 150–400)
RBC: 3.23 MIL/uL — ABNORMAL LOW (ref 4.22–5.81)
RDW: 15.7 % — AB (ref 11.5–15.5)
WBC: 9.4 10*3/uL (ref 4.0–10.5)

## 2017-11-29 LAB — BASIC METABOLIC PANEL
ANION GAP: 13 (ref 5–15)
BUN: 8 mg/dL (ref 6–20)
CHLORIDE: 103 mmol/L (ref 101–111)
CO2: 23 mmol/L (ref 22–32)
Calcium: 8.1 mg/dL — ABNORMAL LOW (ref 8.9–10.3)
Creatinine, Ser: 1.04 mg/dL (ref 0.61–1.24)
GFR calc non Af Amer: >60 mL/min (ref >60)
GLUCOSE: 87 mg/dL (ref 65–99)
Potassium: 3.3 mmol/L — ABNORMAL LOW (ref 3.5–5.1)
Sodium: 139 mmol/L (ref 135–145)

## 2017-11-29 LAB — TYPE AND SCREEN
ABO/RH(D): O POS
ANTIBODY SCREEN: NEGATIVE
UNIT DIVISION: 0

## 2017-11-29 LAB — BPAM RBC
Blood Product Expiration Date: 201904242359
ISSUE DATE / TIME: 201903291821
UNIT TYPE AND RH: 5100

## 2017-11-29 LAB — BLOOD PRODUCT ORDER (VERBAL) VERIFICATION

## 2017-11-29 LAB — ALBUMIN: Albumin: 2 g/dL — ABNORMAL LOW (ref 3.5–5.0)

## 2017-11-29 LAB — ANAEROBIC CULTURE

## 2017-11-29 LAB — PHOSPHORUS: Phosphorus: 4.4 mg/dL (ref 2.5–4.6)

## 2017-11-29 LAB — MAGNESIUM: Magnesium: 1.4 mg/dL — ABNORMAL LOW (ref 1.7–2.4)

## 2017-11-29 MED ORDER — MAGNESIUM SULFATE 2 GM/50ML IV SOLN
2.0000 g | Freq: Once | INTRAVENOUS | Status: AC
  Administered 2017-11-29: 2 g via INTRAVENOUS
  Filled 2017-11-29: qty 50

## 2017-11-29 MED ORDER — POTASSIUM CHLORIDE CRYS ER 20 MEQ PO TBCR
40.0000 meq | EXTENDED_RELEASE_TABLET | Freq: Two times a day (BID) | ORAL | Status: DC
  Administered 2017-11-29 – 2017-12-01 (×4): 40 meq via ORAL
  Filled 2017-11-29 (×4): qty 2

## 2017-11-29 MED ORDER — SODIUM CHLORIDE 0.9 % IV SOLN
2.0000 g | INTRAVENOUS | Status: DC
  Administered 2017-11-29 – 2017-11-30 (×2): 2 g via INTRAVENOUS
  Filled 2017-11-29 (×2): qty 20

## 2017-11-29 MED ORDER — POTASSIUM CHLORIDE CRYS ER 20 MEQ PO TBCR
40.0000 meq | EXTENDED_RELEASE_TABLET | Freq: Once | ORAL | Status: AC
  Administered 2017-11-29: 40 meq via ORAL
  Filled 2017-11-29: qty 2

## 2017-11-29 MED ORDER — ALUM & MAG HYDROXIDE-SIMETH 200-200-20 MG/5ML PO SUSP: 30.0000 mL | Freq: Once | ORAL | Status: DC

## 2017-11-29 MED ORDER — HYDROCODONE-ACETAMINOPHEN 5-325 MG PO TABS
1.0000 | ORAL_TABLET | ORAL | Status: DC | PRN
  Administered 2017-11-29 – 2017-12-01 (×8): 2 via ORAL
  Filled 2017-11-29 (×8): qty 2

## 2017-11-29 MED ORDER — FENTANYL CITRATE (PF) 100 MCG/2ML IJ SOLN
25.0000 ug | Freq: Once | INTRAMUSCULAR | Status: AC
  Administered 2017-11-29: 25 ug via INTRAVENOUS
  Filled 2017-11-29: qty 2

## 2017-11-29 NOTE — Progress Notes (Signed)
5 Days Post-Op Procedure(s) (LRB): VIDEO ASSISTED THORACOSCOPY (VATS)/EMPYEMA (Left) VIDEO BRONCHOSCOPY (N/A) CHEST EXPLORATION (Left) Subjective: Sitting up eating breakfast Chest tube drainage has tapered off-will remove anterior tube today Chest x-ray is clear Cultures negative from operative specimen Patient recommended to start walking in hallway  Objective: Vital signs in last 24 hours: Temp:  [97 F (36.1 C)-98.2 F (36.8 C)] 97.6 F (36.4 C) (03/30 0741) Pulse Rate:  [80-95] 90 (03/30 0908) Cardiac Rhythm: Normal sinus rhythm (03/30 0800) Resp:  [12-28] 28 (03/30 0908) BP: (108-167)/(64-104) 117/64 (03/30 0908) SpO2:  [95 %-98 %] 95 % (03/30 0908) Weight:  [132 lb 4.4 oz (60 kg)] 132 lb 4.4 oz (60 kg) (03/30 0500)  Hemodynamic parameters for last 24 hours:    Intake/Output from previous [place]: 03/29 0701 - 03/30 0700 In: 1335 [P.O.:600; I.V.:120; Blood:315; IV Piggyback:300] Out: 4250 [Urine:4050; Chest Tube:200] Intake/Output this shift: Total I/O In: -  Out: 30 [Chest Tube:30]  Alert and responsive somewhat depressed Breath sounds clear left lung base Minimal left chest tube drainage or air leak Sinus rhythm  Lab Results: Recent Labs    11/28/17 0415 11/29/17 0346  WBC 8.3 9.4  HGB 7.7* 9.5*  HCT 23.2* 28.9*  PLT 355 376   BMET:  Recent Labs    11/28/17 0415 11/29/17 0346  NA 136 139  K 3.1* 3.3*  CL 106 103  CO2 23 23  GLUCOSE 81 87  BUN 8 8  CREATININE 0.84 1.04  CALCIUM 7.9* 8.1*    PT/INR: No results for input(s): LABPROT, INR in the last 72 hours. ABG    Component Value Date/Time   PHART 7.363 11/25/2017 0356   HCO3 19.5 (L) 11/25/2017 0356   TCO2 20 (L) 11/24/2017 1750   ACIDBASEDEF 4.9 (H) 11/25/2017 0356   O2SAT 98.1 11/25/2017 0356   CBG (last 3)  Recent Labs    11/28/17 0613 11/28/17 1202 11/28/17 1827  GLUCAP 90 82 109*    Assessment/Plan: S/P Procedure(s) (LRB): VIDEO ASSISTED THORACOSCOPY (VATS)/EMPYEMA  (Left) VIDEO BRONCHOSCOPY (N/A) CHEST EXPLORATION (Left) Improved  postop blood loss anemia Chest tube output less than 200 cc-will remove anterior chest tube Keep in ICU today with chest x-ray in a.m.  LOS: 8 days    Tharon Aquas Trigt III 02-Sep-202019

## 2017-11-29 NOTE — Progress Notes (Addendum)
Pharmacy Antibiotic Note  Brad Turner is a 77 y.o. male with empyema s/p VATs 3/26 on zosyn.  -WBC= 11.7, afeb, SCr= 0.9  -Resp Cx with abundant strep viridans  Plan: Continue Zosyn at current dose. Would recommend switch ceftriaxone for strep viridans. Per MD note planning 2-3 weeks of abx, could we switch to oral once sensitivities back? Will follow renal function, cultures and clinical progress    Height: 5\' 5"  (165.1 cm) Weight: 132 lb 4.4 oz (60 kg) IBW/kg (Calculated) : 61.5  Temp (24hrs), Avg:97.5 F (36.4 C), Min:97 F (36.1 C), Max:98.2 F (36.8 C)  Recent Labs  Lab 11/23/17 1057  11/24/17 1756  11/24/17 2145  11/25/17 0345 11/26/17 0301 11/26/17 1338 11/27/17 0220 11/28/17 0415 11/29/17 0346  WBC  --    < >  --    < >  --    < > 13.8* 16.8*  --  11.7* 8.3 9.4  CREATININE  --    < >  --    < >  --   --  0.98 0.83  --  0.90 0.84 1.04  LATICACIDVEN  --   --  0.8  --  0.7  --   --   --   --   --   --   --   VANCOTROUGH 10*  --   --   --   --   --   --   --  23*  --   --   --    < > = values in this interval not displayed.    Estimated Creatinine Clearance: 52.9 mL/min (by C-G formula based on SCr of 1.04 mg/dL).    Allergies  Allergen Reactions  . Ivp Dye [Iodinated Diagnostic Agents] Hives and Shortness Of Breath    reaction was when pt was 77 years old.  Clancy Gourd [Nitrofurantoin] Other (See Comments)    Fever that lasted several months    Antimicrobials this admission: 3/22 Vancomycin >>3/28 3/22 Zosyn >>   Microbiology results: 3/25 AFR - neg 3/25 resp- abundant strep viridans - sensitivities pending 3/25 pleural fluid- neg 3/22 BCx: NGTD 3/22 MRSA: neg 3/22 UCx: neg 3/22 Legionella: pending 3/22 S. Pneumo: neg   Thank you for allowing pharmacy to be a part of this patient's care.  Uvaldo Rising, BCPS  Clinical Pharmacist Pager [phone]  09-25-202019 10:10 AM

## 2017-11-29 NOTE — Progress Notes (Signed)
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, Barrett's esophagus + GERD last EGD nondysplastic Barrett's, last colonoscopy 11/11 1 hyperplastic polyp, remote stroke  Was admitted by cardiology 1/13-1/17 for possible ST MI found to have Takotsubo cardiomyopathy Return to emergency room 1/19 with severe orthostasis #CPR performed at the time Admitted 3/25 with acute respiratory failure secondary to H CAP loculated effusion-needed to be intubated on 3/25 and had left VATS with decortication 3/26 and extubated subsequently  Assessment & Plan:   Acute respiratory failure with hypoxia status post extubation 5/73 Complicated empyema status post VATS 3/26 cxr 3/30 compared to 3/29 less fluid in lung pleural fluid shows viridans strep--Zosyn-->Ceftriaxone? 2-3 weeks of antibiotics as determined by CVTS White count has progressively improved from 16 to now 9 Hold ibuprofen for pain--use percocet, fentanyl dose dropped but still q2  STEMI 2011, Takotsubo cardiomyopathy 09/2015  Chronic hypotension Orthostasis with falls Continue metoprolol 25 XL daily, continue as needed hydralazine 10-20 every 4 as needed Was given IV Lasix 40 mg 3/29 --continue 3/29 40 IV bid till 4/1 --Net fluid status down from 10 to now 4 liter + plavix on since 3/30 no -bleed 2/2 chronic orthostasis no aggressively control bp --continue Midodrine 2.5 3 times daily  Severe COPD mixed with emphysema still smokes 1 pack/[place] Continue NicoDerm patch, Brovana 15 twice daily, Pulmicort 0.5 twice daily, Atrovent 0.5 every 6 as needed, continue  Xopenex 0.63 every 6 as needed Coughs with diet-cleared for reg diet per SLP monitor  AK I Hypomagnesemia-currently 1.4---replacing with 2 g magnesium sulfate again 3/30 Hypophosphatemia-replacing with K-Phos orally twice daily-now 4.4 Given IV 4 rounds of K-change to K Dur 40 bid up to 3.3  Blood loss anemia in setting of hyperplastic polyps 2011 and Barrett's esophagus Sp prbc 3/29 hemoglobin 7.7--->9.5  Remote stroke h/o Restart on Plavix 75 daily now  Bipolar continue Cymbalta DR 60 daily, Seroquel 25 at bedtime  Hyperlipidemia continue atorvastatin 80 daily discharge fenofibrate 145   DVT prophylaxis: Lovenox code Status:   Full   family Communication:   Son at bedside disposition Plan: Transition out of unit and dispo as per CV TS regarding tube and other management   Consultants:   Pulmonary  CV TS  Procedures:   Decortication and VATS 3/26  Antimicrobials:   Currently on Zosyn  Subjective:  Some abd pain and leg pain nursign concerned about cough No cp No fever no chills not moving around much, not eating much per RN Passed swallow although nursing reports some cough when eating   Objective: Vitals:   11/29/17 1300 11/29/17 1400 11/29/17 1519 11/29/17 1600  BP:    132/78  Pulse: 78 83  72  Resp: 16 16  15   Temp:  (!) 97.5 F (36.4 C) 97.7 F (36.5 C)   TempSrc:  Oral Oral   SpO2:    99%  Weight:      Height:        Intake/Output Summary (Last 24 hours) at 10-01-2017 1620 Last data filed at 10-01-2017 1429 Gross per 24 hour  Intake 1225 ml  Output 4125 ml  Net -2900 ml  Filed Weights   11/26/17 0500 11/28/17 0500 11/29/17 0500  Weight: 66.6 kg (146 lb 13.2 oz) 63.2 kg (139 lb 4.8 oz) 60 kg (132 lb 4.4 oz)    Examination: eomi ncat, no ict no pallor-central line in R neck area-clean Hands and forearms edematous but a little less No rales no rhonchi Chest tubes in place-Lung sounds crackles in bases, egophony worse on L side posterior No  le edema,  abd soft nt nd no rebound Foley in place   Data Reviewed: I have personally reviewed following labs and imaging studies  CBC: Recent Labs  Lab 11/23/17 0226 11/24/17 0225  11/25/17 0345 11/26/17 0301 11/27/17 0220 11/28/17 0415 11/29/17 0346  WBC 17.4* 15.7*   < > 13.8* 16.8* 11.7* 8.3 9.4  NEUTROABS 14.3* 12.6*  --   --   --   --   --  5.7  HGB 8.8* 8.1*   < > 8.5* 9.3* 8.2* 7.7* 9.5*  HCT 26.5* 24.9*   < > 24.1* 27.1* 24.7* 23.2* 28.9*  MCV 94.3 92.9   < > 84.3 86.9 88.2 88.2 89.5  PLT 565* 578*   < > 221 314 336 355 376   < > = values in this interval not displayed.   Basic Metabolic Panel: Recent Labs  Lab 11/23/17 0226 11/24/17 0225  11/25/17 0345 11/26/17 0301 11/27/17 0220 11/28/17 0415 11/29/17 0346  NA 130* 129*   < > 134* 135 136 136 139  K 3.9 3.8   < > 4.1 4.3 4.2 3.1* 3.3*  CL 100* 101   < > 106 109 108 106 103  CO2 21* 21*   < > 20* 19* 21* 23 23  GLUCOSE 121* 90   < > 116* 85 80 81 87  BUN 8 9   < > 11 9 8 8 8   CREATININE 0.88 0.96   < > 0.98 0.83 0.90 0.84 1.04  CALCIUM 7.1* 7.4*   < > 8.4* 7.7* 8.0* 7.9* 8.1*  MG 2.1 1.9  --   --   --  1.5* 1.8 1.4*  PHOS  --   --   --   --   --  2.4* 3.1 4.4   < > = values in this interval not displayed.   GFR: Estimated Creatinine Clearance: 52.9 mL/min (by C-G formula based on SCr of 1.04 mg/dL). Liver Function Tests: Recent Labs  Lab 11/23/17 0226 11/26/17 0301 11/29/17 0811  AST 27 35  --   ALT 14* 17  --   ALKPHOS 56 53  --   BILITOT 0.8 1.0  --   PROT 5.2* 4.8*  --   ALBUMIN 1.6* 2.1* 2.0*   No results for input(s): LIPASE, AMYLASE in the last 168 hours. No results for input(s): AMMONIA in the last 168 hours. Coagulation Profile: Recent Labs  Lab 11/23/17 1806 11/24/17 1500 11/24/17 1807  INR 1.17 1.28 1.40   Cardiac Enzymes:  Radiology Studies: Reviewed images personally in health database   Scheduled Meds: . arformoterol  15 mcg Nebulization BID  . atorvastatin  80 mg  Oral Daily  . bisacodyl  10 mg Oral Daily  . budesonide (PULMICORT) nebulizer solution  0.5 mg Nebulization BID  . Chlorhexidine Gluconate Cloth  6 each Topical Daily  . clopidogrel  75 mg Oral Daily  . DULoxetine  60 mg Oral Daily  . feeding supplement (ENSURE ENLIVE)  237 mL Oral TID BM  . furosemide  40 mg Intravenous Q12H  .  guaiFENesin  1,200 mg Oral BID  . loratadine  10 mg Oral Daily  . metoprolol succinate  25 mg Oral Daily  . mirabegron ER  50 mg Oral Daily  . multivitamin with minerals  1 tablet Oral Daily  . nicotine  21 mg Transdermal Daily  . pantoprazole  40 mg Oral Q0600  . phosphorus  500 mg Oral BID  . potassium chloride  40 mEq Oral BID  . QUEtiapine  25 mg Oral QHS  . senna-docusate  1 tablet Oral QHS  . sodium chloride flush  10-40 mL Intracatheter Q12H   Continuous Infusions: . cefTRIAXone (ROCEPHIN)  IV Stopped (11/29/17 1459)  . magnesium sulfate 1 - 4 g bolus IVPB 2 g (11/29/17 1552)     LOS: 8 days    Time spent: [place], MD [facility] ([facility]  If 7PM-7AM, please contact night-coverage [email or web address] Password [email or web address] 11-29-2017, 4:20 PM

## 2017-11-30 ENCOUNTER — Inpatient Hospital Stay (HOSPITAL_COMMUNITY): Payer: Medicare Other

## 2017-11-30 LAB — RENAL FUNCTION PANEL
Albumin: 2 g/dL — ABNORMAL LOW (ref 3.5–5.0)
Anion gap: 9 (ref 5–15)
BUN: 10 mg/dL (ref 6–20)
CALCIUM: 8.3 mg/dL — AB (ref 8.9–10.3)
CO2: 28 mmol/L (ref 22–32)
CREATININE: 1.13 mg/dL (ref 0.61–1.24)
Chloride: 100 mmol/L — ABNORMAL LOW (ref 101–111)
Glucose, Bld: 91 mg/dL (ref 65–99)
Phosphorus: 4.7 mg/dL — ABNORMAL HIGH (ref 2.5–4.6)
Potassium: 4.2 mmol/L (ref 3.5–5.1)
SODIUM: 137 mmol/L (ref 135–145)

## 2017-11-30 LAB — CBC
HCT: 30.8 % — ABNORMAL LOW (ref 39.0–52.0)
Hemoglobin: 10.1 g/dL — ABNORMAL LOW (ref 13.0–17.0)
MCH: 30 pg (ref 26.0–34.0)
MCHC: 32.8 g/dL (ref 30.0–36.0)
MCV: 91.4 fL (ref 78.0–100.0)
Platelets: 406 10*3/uL — ABNORMAL HIGH (ref 150–400)
RBC: 3.37 MIL/uL — ABNORMAL LOW (ref 4.22–5.81)
RDW: 16 % — ABNORMAL HIGH (ref 11.5–15.5)
WBC: 11.9 10*3/uL — ABNORMAL HIGH (ref 4.0–10.5)

## 2017-11-30 LAB — MAGNESIUM: MAGNESIUM: 1.7 mg/dL (ref 1.7–2.4)

## 2017-11-30 MED ORDER — CEFDINIR 250 MG/5ML PO SUSR: 600.0000 mg | Freq: Two times a day (BID) | ORAL | Status: DC

## 2017-11-30 MED ORDER — FUROSEMIDE 10 MG/ML IJ SOLN
40.0000 mg | Freq: Every day | INTRAMUSCULAR | Status: DC
  Administered 2017-12-01: 40 mg via INTRAVENOUS
  Filled 2017-11-30: qty 4

## 2017-11-30 MED ORDER — CEFDINIR 125 MG/5ML PO SUSR
300.0000 mg | Freq: Two times a day (BID) | ORAL | Status: DC
  Administered 2017-11-30 – 2017-12-01 (×2): 300 mg via ORAL
  Filled 2017-11-30: qty 15
  Filled 2017-11-30: qty 6
  Filled 2017-11-30: qty 15

## 2017-11-30 MED ORDER — MAGNESIUM SULFATE 2 GM/50ML IV SOLN
2.0000 g | Freq: Once | INTRAVENOUS | Status: AC
  Administered 2017-11-30: 2 g via INTRAVENOUS
  Filled 2017-11-30: qty 50

## 2017-11-30 NOTE — Progress Notes (Signed)
6 Days Post-Op Procedure(s) (LRB): VIDEO ASSISTED THORACOSCOPY (VATS)/EMPYEMA (Left) VIDEO BRONCHOSCOPY (N/A) CHEST EXPLORATION (Left) Subjective: Chest x-ray stable, drainage from the remaining left chest tube tapering off Chest tube placed to waterseal today with plans to remove tomorrow with chest x-ray shows no change  Objective: Vital signs in last 24 hours: Temp:  [97.6 F (36.4 C)-98.4 F (36.9 C)] 97.7 F (36.5 C) (03/31 1513) Pulse Rate:  [70-88] 70 (03/31 1513) Cardiac Rhythm: Normal sinus rhythm (03/31 1500) Resp:  [13-17] 13 (03/31 1513) BP: (130-158)/(76-94) 132/85 (03/31 1513) SpO2:  [93 %-99 %] 99 % (03/31 0956)  Hemodynamic parameters for last 24 hours:  Afebrile  Intake/Output from previous [place]: 03/30 0701 - 03/31 0700 In: 800 [P.O.:600; IV Piggyback:200] Out: 3925 [Urine:3875; Chest Tube:50] Intake/Output this shift: Total I/O In: 410 [P.O.:360; IV Piggyback:50] Out: 1470 [Urine:1450; Chest Tube:20]  Good breath sounds bilaterally No air leak from chest tube  Lab Results: Recent Labs    11/29/17 0346 11/30/17 0500  WBC 9.4 11.9*  HGB 9.5* 10.1*  HCT 28.9* 30.8*  PLT 376 406*   BMET:  Recent Labs    11/29/17 0346 11/30/17 0500  NA 139 137  K 3.3* 4.2  CL 103 100*  CO2 23 28  GLUCOSE 87 91  BUN 8 10  CREATININE 1.04 1.13  CALCIUM 8.1* 8.3*    PT/INR: No results for input(s): LABPROT, INR in the last 72 hours. ABG    Component Value Date/Time   PHART 7.363 11/25/2017 0356   HCO3 19.5 (L) 11/25/2017 0356   TCO2 20 (L) 11/24/2017 1750   ACIDBASEDEF 4.9 (H) 11/25/2017 0356   O2SAT 98.1 11/25/2017 0356   CBG (last 3)  Recent Labs    11/28/17 0613 11/28/17 1202 11/28/17 1827  GLUCAP 90 82 109*    Assessment/Plan: S/P Procedure(s) (LRB): VIDEO ASSISTED THORACOSCOPY (VATS)/EMPYEMA (Left) VIDEO BRONCHOSCOPY (N/A) CHEST EXPLORATION (Left) Continue IV antibiotics for left empyema Check x-ray in a.m.  LOS: 9 days    Tharon Aquas  Trigt III 11/30/2017

## 2017-11-30 NOTE — Progress Notes (Signed)
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, Barrett's esophagus + GERD last EGD nondysplastic Barrett's, last colonoscopy 11/11 1 hyperplastic polyp, remote stroke  Was admitted by cardiology 1/13-1/17 for possible ST MI found to have Takotsubo cardiomyopathy Return to emergency room 1/19 with severe orthostasis #CPR performed at the time Admitted 3/25 with acute respiratory failure secondary to H CAP loculated effusion-needed to be intubated on 3/25 and had left VATS with decortication 3/26 and extubated subsequently  Assessment & Plan:   Acute respiratory failure with hypoxia status post extubation 9/56 Complicated empyema status post VATS 3/26 cxr 3/30 compared to 3/29 less fluid in lung pleural fluid shows viridans strep--Zosyn-->Ceftriaxone--->Cefdinir 600 bid on 3/31 and will continue to cover until White count has progressively improved from 16 to now 9 Hold ibuprofen for pain--use percocet, fentanyl dose dropped but still q2  STEMI 2011, Takotsubo cardiomyopathy 09/2015  Chronic hypotension Orthostasis with falls Continue metoprolol 25 XL daily, continue as needed hydralazine 10-20 every 4 as needed Was given IV Lasix 40 mg 3/29 --continue 3/29 40 IV bid --->40 miv daily--Net fluid status down from 10 to now +1.4 plavix on since 3/30 no -bleed 2/2 chronic orthostasis no aggressively control bp --continue Midodrine 2.5 3 times daily  Severe COPD mixed with emphysema still smokes 1 pack/[place] Continue NicoDerm patch, Brovana 15 twice daily, Pulmicort 0.5 twice daily, Atrovent 0.5 every 6 as  needed, continue Xopenex 0.63 every 6 as needed Coughs with diet-cleared for reg diet per SLP monitor  AK I Hypomagnesemia-currently 1.7---replacing with 2 g magnesium sulfate again 3/31 one more e time Hypophosphatemia-replacing with K-Phos orally twice daily-now 4.4 Given IV 4 rounds of K-change to K Dur 40 bid up to 4.2  Blood loss anemia in setting of hyperplastic polyps 2011 and Barrett's esophagus Sp prbc 3/29 hemoglobin 7.7--->9.5--->10.5  Remote stroke h/o Restart on Plavix 75 daily now  Bipolar continue Cymbalta DR 60 daily, Seroquel 25 at bedtime  Hyperlipidemia continue atorvastatin 80 daily discharge fenofibrate 145   DVT prophylaxis: Lovenox code Status:   Full   family Communication:   Called son Left VM disposition Plan: Transition out of unit and dispo as per CV TS regarding tube and other management   Consultants:   Pulmonary  CV TS  Procedures:   Decortication and VATS 3/26  Antimicrobials:   Currently on Zosyn  Subjective:  Still pain Trying to avoid going to rehab--thinks can get OOB and do for self --overall still weak    Objective: Vitals:   11/30/17 0900 11/30/17 0956 11/30/17 1250 11/30/17 1513  BP:    132/85  Pulse:  88  70  Resp:  16  13  Temp:   98 F (36.7 C) 97.7 F (36.5 C)  TempSrc:   Oral Oral  SpO2: 93% 99%    Weight:      Height:        Intake/Output Summary (Last 24 hours) at 11/30/2017 1629 Last data filed at 11/30/2017 1500 Gross per 24 hour  Intake 650 ml  Output 4170 ml  Net -3520 ml   Filed Weights   11/26/17 0500 11/28/17 0500 11/29/17 0500  Weight: 66.6 kg (146 lb 13.2 oz) 63.2 kg (139 lb 4.8 oz) 60 kg (132 lb 4.4 oz)    Examination: eomi ncat, no ict no pallor-central line in R neck area-clean Hands and forearms less swollen No rales no rhonchi -chets tube in situ No le edema,  abd soft nt nd no rebound Condom cath on   Data Reviewed: I have personally reviewed following labs and imaging  studies  CBC: Recent Labs  Lab 11/24/17 0225  11/26/17 0301 11/27/17 0220 11/28/17 0415 11/29/17 0346 11/30/17 0500  WBC 15.7*   < > 16.8* 11.7* 8.3 9.4 11.9*  NEUTROABS 12.6*  --   --   --   --  5.7  --   HGB 8.1*   < > 9.3* 8.2* 7.7* 9.5* 10.1*  HCT 24.9*   < > 27.1* 24.7* 23.2* 28.9* 30.8*  MCV 92.9   < > 86.9 88.2 88.2 89.5 91.4  PLT 578*   < > 314 336 355 376 406*   < > = values in this interval not displayed.   Basic Metabolic Panel: Recent Labs  Lab 11/24/17 0225  11/26/17 0301 11/27/17 0220 11/28/17 0415 11/29/17 0346 11/30/17 0500  NA 129*   < > 135 136 136 139 137  K 3.8   < > 4.3 4.2 3.1* 3.3* 4.2  CL 101   < > 109 108 106 103 100*  CO2 21*   < > 19* 21* 23 23 28   GLUCOSE 90   < > 85 80 81 87 91  BUN 9   < > 9 8 8 8 10   CREATININE 0.96   < > 0.83 0.90 0.84 1.04 1.13  CALCIUM 7.4*   < > 7.7* 8.0* 7.9* 8.1* 8.3*  MG 1.9  --   --  1.5* 1.8 1.4* 1.7  PHOS  --   --   --  2.4* 3.1 4.4 4.7*   < > = values in this interval not displayed.   GFR: Estimated Creatinine Clearance: 48.7 mL/min (by C-G formula based on SCr of 1.13 mg/dL). Liver Function Tests: Recent Labs  Lab 11/26/17 0301 11/29/17 0811 11/30/17 0500  AST 35  --   --   ALT 17  --   --   ALKPHOS 53  --   --   BILITOT 1.0  --   --   PROT 4.8*  --   --   ALBUMIN 2.1* 2.0* 2.0*   No results for input(s): LIPASE, AMYLASE in the last 168 hours. No results for input(s): AMMONIA in the last 168 hours. Coagulation Profile: Recent Labs  Lab 11/23/17 1806 11/24/17 1500 11/24/17 1807  INR 1.17 1.28 1.40   Cardiac Enzymes:  Radiology Studies: Reviewed images personally in health database   Scheduled Meds: . alum & mag hydroxide-simeth  30 mL Oral Once  . arformoterol  15 mcg Nebulization BID  . atorvastatin  80 mg Oral Daily  . bisacodyl  10 mg Oral Daily  . budesonide (PULMICORT) nebulizer solution  0.5 mg Nebulization BID  . Chlorhexidine Gluconate Cloth  6 each Topical Daily  .  clopidogrel  75 mg Oral Daily  . DULoxetine  60 mg Oral Daily  . feeding supplement (ENSURE ENLIVE)  237 mL Oral TID BM  . furosemide  40 mg Intravenous Q12H  . guaiFENesin  1,200 mg Oral BID  . loratadine  10 mg Oral Daily  . metoprolol succinate  25 mg Oral Daily  . mirabegron ER  50  mg Oral Daily  . multivitamin with minerals  1 tablet Oral Daily  . nicotine  21 mg Transdermal Daily  . pantoprazole  40 mg Oral Q0600  . phosphorus  500 mg Oral BID  . potassium chloride  40 mEq Oral BID  . QUEtiapine  25 mg Oral QHS  . senna-docusate  1 tablet Oral QHS  . sodium chloride flush  10-40 mL Intracatheter Q12H   Continuous Infusions: . cefTRIAXone (ROCEPHIN)  IV Stopped (11/30/17 1430)     LOS: 9 days    Time spent: [place], MD [facility] ([facility]  If 7PM-7AM, please contact night-coverage [email or web address] Password [facility] 11/30/2017, 4:29 PM

## 2017-12-01 ENCOUNTER — Inpatient Hospital Stay (HOSPITAL_COMMUNITY): Payer: Medicare Other

## 2017-12-01 DIAGNOSIS — L8990 Pressure ulcer of unspecified site, unspecified stage: Secondary | ICD-10-CM

## 2017-12-01 LAB — RENAL FUNCTION PANEL
Albumin: 1.8 g/dL — ABNORMAL LOW (ref 3.5–5.0)
Anion gap: 10 (ref 5–15)
BUN: 13 mg/dL (ref 6–20)
CALCIUM: 8.1 mg/dL — AB (ref 8.9–10.3)
CO2: 29 mmol/L (ref 22–32)
CREATININE: 1.05 mg/dL (ref 0.61–1.24)
Chloride: 96 mmol/L — ABNORMAL LOW (ref 101–111)
Glucose, Bld: 90 mg/dL (ref 65–99)
Phosphorus: 4 mg/dL (ref 2.5–4.6)
Potassium: 4.4 mmol/L (ref 3.5–5.1)
SODIUM: 135 mmol/L (ref 135–145)

## 2017-12-01 LAB — MAGNESIUM: MAGNESIUM: 2.5 mg/dL — AB (ref 1.7–2.4)

## 2017-12-01 MED ORDER — CEFDINIR 300 MG PO CAPS
300.0000 mg | ORAL_CAPSULE | Freq: Two times a day (BID) | ORAL | Status: DC
  Administered 2017-12-01 – 2017-12-03 (×4): 300 mg via ORAL
  Filled 2017-12-01 (×5): qty 1

## 2017-12-01 MED ORDER — FUROSEMIDE 40 MG PO TABS
40.0000 mg | ORAL_TABLET | Freq: Every day | ORAL | Status: DC
  Administered 2017-12-02: 40 mg via ORAL
  Filled 2017-12-01: qty 1

## 2017-12-01 MED ORDER — POTASSIUM CHLORIDE CRYS ER 20 MEQ PO TBCR
40.0000 meq | EXTENDED_RELEASE_TABLET | Freq: Every day | ORAL | Status: DC
  Administered 2017-12-02 – 2017-12-03 (×2): 40 meq via ORAL
  Filled 2017-12-01 (×2): qty 2

## 2017-12-01 NOTE — Progress Notes (Addendum)
      [name]       [place] [address]             [phone]      7 Days Post-Op Procedure(s) (LRB): VIDEO ASSISTED THORACOSCOPY (VATS)/EMPYEMA (Left) VIDEO BRONCHOSCOPY (N/A) CHEST EXPLORATION (Left)   Subjective:  No new complaints.  Objective: Vital signs in last 24 hours: Temp:  [97.3 F (36.3 C)-98 F (36.7 C)] 97.8 F (36.6 C) (04/01 0809) Pulse Rate:  [70-88] 80 (04/01 0809) Cardiac Rhythm: Normal sinus rhythm (03/31 2345) Resp:  [13-17] 15 (04/01 0809) BP: (113-141)/(68-94) 141/94 (04/01 0809) SpO2:  [93 %-99 %] 94 % (04/01 0809) Weight:  [127 lb 3.3 oz (57.7 kg)] 127 lb 3.3 oz (57.7 kg) (04/01 0500)  Intake/Output from previous [place]: 03/31 0701 - 04/01 0700 In: 420 [P.O.:360; I.V.:10; IV Piggyback:50] Out: 0962 [Urine:1550; Chest Tube:60] Intake/Output this shift: Total I/O In: -  Out: 300 [Urine:300]  General appearance: alert, cooperative and no distress Heart: regular rate and rhythm Lungs: diminished breath sounds LLL Abdomen: soft, non-tender; bowel sounds normal; no masses,  no organomegaly Wound: clean and dry  Lab Results: Recent Labs    11/29/17 0346 11/30/17 0500  WBC 9.4 11.9*  HGB 9.5* 10.1*  HCT 28.9* 30.8*  PLT 376 406*   BMET:  Recent Labs    11/30/17 0500 12/01/17 0513  NA 137 135  K 4.2 4.4  CL 100* 96*  CO2 28 29  GLUCOSE 91 90  BUN 10 13  CREATININE 1.13 1.05  CALCIUM 8.3* 8.1*    PT/INR: No results for input(s): LABPROT, INR in the last 72 hours. ABG    Component Value Date/Time   PHART 7.363 11/25/2017 0356   HCO3 19.5 (L) 11/25/2017 0356   TCO2 20 (L) 11/24/2017 1750   ACIDBASEDEF 4.9 (H) 11/25/2017 0356   O2SAT 98.1 11/25/2017 0356   CBG (last 3)  Recent Labs    11/28/17 1202 11/28/17 1827  GLUCAP 82 109*    Assessment/Plan: S/P Procedure(s) (LRB): VIDEO ASSISTED THORACOSCOPY (VATS)/EMPYEMA (Left) VIDEO BRONCHOSCOPY (N/A) CHEST EXPLORATION (Left)  1. Chest tube-output  remains low, no air leak.. CXR stable in appearance 2. Dispo-patient stable, d/c chest tube, care per primary   LOS: 10 days    Brad Turner 12/01/2017  chest tube out today  I have seen and examined Brad Turner and agree with the above assessment  and plan.  [name] MD [facility] [phone] Office [phone] 12/01/2017 4:52 PM

## 2017-12-01 NOTE — Progress Notes (Signed)
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, Barrett's esophagus + GERD last EGD nondysplastic Barrett's, last colonoscopy 11/11 1 hyperplastic polyp, remote stroke  Was admitted by cardiology 1/13-1/17 for possible ST MI found to have Takotsubo cardiomyopathy Return to emergency room 1/19 with severe orthostasis #CPR performed at the time Admitted 3/25 with acute respiratory failure secondary to H CAP loculated effusion-needed to be intubated on 3/25 and had left VATS with decortication 3/26 and extubated subsequently  Assessment & Plan:   Acute respiratory failure with hypoxia status post extubation 2/95 Complicated empyema status post VATS 3/26 cxr 3/30 compared to 3/29 less fluid in lung pleural fluid shows viridans strep--Zosyn-->Ceftriaxone--->Cefdinir 600 bid on 3/31 and will continue to cover until 4/12 (3 weeks) Repeat white count a.m.  Hold ibuprofen for pain--use percocet, discontinuing fentanyl 4/1  STEMI 2011, Takotsubo cardiomyopathy 09/2015  Chronic hypotension Orthostasis with falls Continue metoprolol 25 XL daily, continue as needed hydralazine 10-20 every 4 as needed Was given IV Lasix 40 mg 3/29 --continue 3/29 40 IV bid --->40 IV daily to p.o. 40 mg daily 4/1 -Net fluid status down from 10 to now?  -7.7 plavix on since 3/30 no -bleed 2/2 chronic orthostasis no aggressively control bp --continue Midodrine 2.5 3x times daily  Severe COPD mixed with emphysema still smokes 1 pack/Mamone Continue NicoDerm patch, Brovana 15 twice daily, Pulmicort 0.5 twice daily, Atrovent 0.5 every 6 as  needed, continue Xopenex 0.63 every 6 as needed Coughs with diet-cleared for reg diet per SLP monitor  AK I Hypomagnesemia-currently 1.7---replacing with 2 g magnesium sulfate again 3/31 one more time and discontinued as 2.5 on 4/1 Hypophosphatemia-replacing with K-Phos orally twice daily-now 4.0.  Stopping replacement Given IV 4 rounds of K-change to K Dur 40 bid up to 4.4-->changing to 40 po qd 4/1  Blood loss anemia in setting of hyperplastic polyps 2011 and Barrett's esophagus Sp prbc 3/29 hemoglobin 7.7--->9.5--->10.1, labs am  Remote stroke h/o Restart on Plavix 75 daily now  Bipolar continue Cymbalta DR 60 daily, Seroquel 25 at bedtime  Hyperlipidemia continue atorvastatin 80 daily discharge fenofibrate 145   DVT prophylaxis: Lovenox code Status:   Full   family Communication:   No family + disposition Plan: Transfer to telemetry once tube out--HE really doesn't want to go to SNF, but I have encouraged him to think about it some more.    Consultants:   Pulmonary  CV TS  Procedures:   Decortication and VATS 3/26  Antimicrobials:   Currently on Zosyn  Subjective:  Some pain A little sleepy today No fever no chills noted Eating some No cp  Objective: Vitals:   11/30/17 2345 12/01/17 0353 12/01/17 0500 12/01/17 0809  BP: 113/77 114/68  (!) 141/94  Pulse: 79 72  80  Resp: 15 14  15   Temp: (!) 97.3 F (36.3 C) 97.6 F (36.4 C)  97.8 F (36.6 C)  TempSrc: Oral Axillary  Oral  SpO2: 94% 93%  94%  Weight:   57.7 kg (127 lb 3.3 oz)   Height:        Intake/Output Summary (Last 24 hours) at 12/01/2017 1100 Last data filed  at 12/01/2017 1024 Gross per 24 hour  Intake 160 ml  Output 1440 ml  Net -1280 ml   Filed Weights   11/28/17 0500 11/29/17 0500 12/01/17 0500  Weight: 63.2 kg (139 lb 4.8 oz) 60 kg (132 lb 4.4 oz) 57.7 kg (127 lb 3.3 oz)    Examination:  Alert but a little sleepy, eomi ncat abd soft nt nd no rebound no guard R Ij inn place Chest  tube in place Now in chair Neuro moving 4 limbs and intact  Data Reviewed: I have personally reviewed following labs and imaging studies  CBC: Recent Labs  Lab 11/26/17 0301 11/27/17 0220 11/28/17 0415 11/29/17 0346 11/30/17 0500  WBC 16.8* 11.7* 8.3 9.4 11.9*  NEUTROABS  --   --   --  5.7  --   HGB 9.3* 8.2* 7.7* 9.5* 10.1*  HCT 27.1* 24.7* 23.2* 28.9* 30.8*  MCV 86.9 88.2 88.2 89.5 91.4  PLT 314 336 355 376 983*   Basic Metabolic Panel: Recent Labs  Lab 11/27/17 0220 11/28/17 0415 11/29/17 0346 11/30/17 0500 12/01/17 0513  NA 136 136 139 137 135  K 4.2 3.1* 3.3* 4.2 4.4  CL 108 106 103 100* 96*  CO2 21* 23 23 28 29   GLUCOSE 80 81 87 91 90  BUN 8 8 8 10 13   CREATININE 0.90 0.84 1.04 1.13 1.05  CALCIUM 8.0* 7.9* 8.1* 8.3* 8.1*  MG 1.5* 1.8 1.4* 1.7 2.5*  PHOS 2.4* 3.1 4.4 4.7* 4.0   GFR: Estimated Creatinine Clearance: 50.4 mL/min (by C-G formula based on SCr of 1.05 mg/dL). Liver Function Tests: Recent Labs  Lab 11/26/17 0301 11/29/17 0811 11/30/17 0500 12/01/17 0513  AST 35  --   --   --   ALT 17  --   --   --   ALKPHOS 53  --   --   --   BILITOT 1.0  --   --   --   PROT 4.8*  --   --   --   ALBUMIN 2.1* 2.0* 2.0* 1.8*   No results for input(s): LIPASE, AMYLASE in the last 168 hours. No results for input(s): AMMONIA in the last 168 hours. Coagulation Profile: Recent Labs  Lab 11/24/17 1500 11/24/17 1807  INR 1.28 1.40   Cardiac Enzymes:  Radiology Studies: Reviewed images personally in health database   Scheduled Meds: . alum & mag hydroxide-simeth  30 mL Oral Once  . arformoterol  15 mcg Nebulization BID  . atorvastatin  80 mg Oral Daily  . bisacodyl  10 mg Oral Daily  . budesonide (PULMICORT) nebulizer solution  0.5 mg Nebulization BID  . cefdinir  300 mg Oral BID  . Chlorhexidine Gluconate Cloth  6 each Topical Daily  . clopidogrel  75 mg Oral Daily  . DULoxetine  60 mg Oral Daily  . feeding supplement (ENSURE ENLIVE)  237 mL Oral  TID BM  . furosemide  40 mg Intravenous Daily  . guaiFENesin  1,200 mg Oral BID  . loratadine  10 mg Oral Daily  . metoprolol succinate  25 mg Oral Daily  . mirabegron ER  50 mg Oral Daily  . multivitamin with minerals  1 tablet Oral Daily  . nicotine  21 mg Transdermal Daily  . pantoprazole  40 mg Oral Q0600  . phosphorus  500 mg Oral BID  . potassium chloride  40 mEq Oral BID  . QUEtiapine  25 mg Oral QHS  . senna-docusate  1 tablet Oral  QHS  . sodium chloride flush  10-40 mL Intracatheter Q12H   Continuous Infusions:    LOS: 10 days    Time spent: [place], MD [facility] ([facility]  If 7PM-7AM, please contact night-coverage [email or web address] Password [email or web address] 12/01/2017, 11:00 AM

## 2017-12-01 NOTE — Progress Notes (Signed)
Occupational Therapy Evaluation Patient Details Name: Brad Turner MRN: [id number] DOB: March 28, 1941 Today's Date: 12/01/2017    History of Present Illness Pt is a 77 y.o. male admitted 11/21/17 with acute respiratory failure secondary to HAP and pleural effusion in setting of COPD. Also with sepsis. Now s/p VATS/empyema and video bronchoscopy on 3/25. PMH includes HTN, COPD, chronic back pain, CAD, depression, neuropathy, macular degeneration, CVA (1995), lumbar laminectomy (2016).   Clinical Impression   PTA, pt lived at home alone and was independent with mobility and ADL. Pt states IADL tasks are difficult to complete. Pt with significant functional decline and requires min A with the ability to only take 3 steps to the chair and mod A with ADL tasks. Pt with apparent cognitive deficits - unsure of baseline function. Pt is not safe to DC home and will benefit from rehab at [facility]. Will follow acutely to address established goals. VSS throughout session on RA    Follow Up Recommendations  SNF;Supervision/Assistance - 24 hour    Equipment Recommendations  Other (comment)(TBA at SNF)    Recommendations for Other Services       Precautions / Restrictions Precautions Precautions: Fall Precaution Comments: chest tube      Mobility Bed Mobility Overal bed mobility: Needs Assistance Bed Mobility: Supine to Sit     Supine to sit: Min assist     General bed mobility comments: Sat EOB and then began to fall posteriorly and required MAx A to prevent falling back into bed. When asked what he was doing, pt said " you told me to lie down" when therapist asked pt to stand and walk into bathroom  Transfers Overall transfer level: Needs assistance Equipment used: 1 person hand held assist Transfers: Sit to/from Stand Sit to Stand: Min assist         General transfer comment: Posterior bias    Balance     Sitting balance-Leahy Scale: Fair       Standing balance-Leahy Scale: Poor                              ADL either performed or assessed with clinical judgement   ADL Overall ADL's : Needs assistance/impaired     Grooming: Set up;Sitting   Upper Body Bathing: Minimal assistance;Sitting   Lower Body Bathing: Moderate assistance;Sit to/from stand   Upper Body Dressing : Minimal assistance;Sitting   Lower Body Dressing: Moderate assistance;Sit to/from stand   Toilet Transfer: Minimal assistance Armed forces technical officer Details (indicate cue type and reason): side step Toileting- Clothing Manipulation and Hygiene: Moderate assistance;Sit to/from stand       Functional mobility during ADLs: Minimal assistance;Cueing for safety;Cueing for sequencing General ADL Comments: Only able to take several side steps to chair with min A. Pt unable to stand upright     Vision         Perception     Praxis      Pertinent Vitals/Pain Pain Assessment: Faces Faces Pain Scale: Hurts little more Pain Location: chest tube/back Pain Descriptors / Indicators: Sore Pain Intervention(s): Limited activity within patient's tolerance     Hand Dominance Right   Extremity/Trunk Assessment Upper Extremity Assessment Upper Extremity Assessment: Generalized weakness   Lower Extremity Assessment Lower Extremity Assessment: Defer to PT evaluation   Cervical / Trunk Assessment Cervical / Trunk Assessment: Kyphotic   Communication Communication Communication: No difficulties   Cognition Arousal/Alertness: Awake/alert Behavior During Therapy: Flat affect Overall Cognitive  Status: No family/caregiver present to determine baseline cognitive functioning Area of Impairment: Orientation;Attention;Memory;Following commands;Safety/judgement;Awareness;Problem solving                 Orientation Level: Disoriented to;Time Current Attention Level: Sustained Memory: Decreased recall of precautions;Decreased short-term memory Following Commands: Follows one step  commands with increased time Safety/Judgement: Decreased awareness of safety;Decreased awareness of deficits Awareness: Emergent Problem Solving: Slow processing;Decreased initiation;Difficulty sequencing;Requires tactile cues;Requires verbal cues General Comments: apparent cognitive deficits   General Comments       Exercises Exercises: Other exercises Other Exercises Other Exercises: encouraged pt to use IS   Shoulder Instructions      Home Living Family/patient expects to be discharged to:: Skilled nursing facility                                        Prior Functioning/Environment Level of Independence: Independent        Comments: lived alone; IADL were difficult; wants to get a fall alarm system        OT Problem List: Decreased strength;Decreased activity tolerance;Impaired balance (sitting and/or standing);Decreased coordination;Decreased cognition;Decreased safety awareness;Decreased knowledge of use of DME or AE;Pain;Cardiopulmonary status limiting activity      OT Treatment/Interventions: Self-care/ADL training;Therapeutic exercise;DME and/or AE instruction;Energy conservation;Therapeutic activities;Cognitive remediation/compensation;Patient/family education;Balance training    OT Goals(Current goals can be found in the care plan section) Acute Rehab OT Goals Patient Stated Goal: to lie down OT Goal Formulation: With patient Time For Goal Achievement: 12/15/17 Potential to Achieve Goals: Good  OT Frequency: Min 2X/week   Barriers to D/C:            Co-evaluation              AM-PAC PT "6 Clicks" Daily Activity     Outcome Measure Help from another person eating meals?: None Help from another person taking care of personal grooming?: A Little Help from another person toileting, which includes using toliet, bedpan, or urinal?: A Lot Help from another person bathing (including washing, rinsing, drying)?: A Lot Help from another  person to put on and taking off regular upper body clothing?: A Little Help from another person to put on and taking off regular lower body clothing?: A Lot 6 Click Score: 16   End of Session Equipment Utilized During Treatment: Gait belt Nurse Communication: Mobility status  Activity Tolerance: Patient limited by fatigue Patient left: in chair;with call bell/phone within reach;with chair alarm set  OT Visit Diagnosis: Unsteadiness on feet (R26.81);Other abnormalities of gait and mobility (R26.89);Repeated falls (R29.6);Other symptoms and signs involving cognitive function;Pain Pain - part of body: (back)                Time: 8921-1941 OT Time Calculation (min): 28 min Charges:  OT General Charges $OT Visit: 1 Visit OT Evaluation $OT Eval Moderate Complexity: 1 Mod OT Treatments $Self Care/Home Management : 8-22 mins G-Codes:     [facility], OT/L  [phone] 12/01/2017  Bohdi Leeds,HILLARY 12/01/2017, 11:47 AM

## 2017-12-02 ENCOUNTER — Inpatient Hospital Stay (HOSPITAL_COMMUNITY): Payer: Medicare Other

## 2017-12-02 DIAGNOSIS — R450 Nervousness: Secondary | ICD-10-CM

## 2017-12-02 DIAGNOSIS — J441 Chronic obstructive pulmonary disease with (acute) exacerbation: Secondary | ICD-10-CM

## 2017-12-02 DIAGNOSIS — I10 Essential (primary) hypertension: Secondary | ICD-10-CM

## 2017-12-02 DIAGNOSIS — F329 Major depressive disorder, single episode, unspecified: Secondary | ICD-10-CM

## 2017-12-02 DIAGNOSIS — J9601 Acute respiratory failure with hypoxia: Secondary | ICD-10-CM

## 2017-12-02 DIAGNOSIS — F17210 Nicotine dependence, cigarettes, uncomplicated: Secondary | ICD-10-CM

## 2017-12-02 DIAGNOSIS — F419 Anxiety disorder, unspecified: Secondary | ICD-10-CM

## 2017-12-02 DIAGNOSIS — I2510 Atherosclerotic heart disease of native coronary artery without angina pectoris: Secondary | ICD-10-CM

## 2017-12-02 DIAGNOSIS — N179 Acute kidney failure, unspecified: Secondary | ICD-10-CM

## 2017-12-02 DIAGNOSIS — R338 Other retention of urine: Secondary | ICD-10-CM

## 2017-12-02 DIAGNOSIS — F1099 Alcohol use, unspecified with unspecified alcohol-induced disorder: Secondary | ICD-10-CM

## 2017-12-02 LAB — CBC WITH DIFFERENTIAL/PLATELET
BASOS ABS: 0 10*3/uL (ref 0.0–0.1)
BASOS PCT: 0 %
EOS ABS: 0.1 10*3/uL (ref 0.0–0.7)
Eosinophils Relative: 0 %
HCT: 32.5 % — ABNORMAL LOW (ref 39.0–52.0)
Hemoglobin: 10.6 g/dL — ABNORMAL LOW (ref 13.0–17.0)
Lymphocytes Relative: 13 %
Lymphs Abs: 2.2 10*3/uL (ref 0.7–4.0)
MCH: 29.9 pg (ref 26.0–34.0)
MCHC: 32.6 g/dL (ref 30.0–36.0)
MCV: 91.8 fL (ref 78.0–100.0)
MONOS PCT: 8 %
Monocytes Absolute: 1.3 10*3/uL — ABNORMAL HIGH (ref 0.1–1.0)
NEUTROS PCT: 79 %
Neutro Abs: 12.6 10*3/uL — ABNORMAL HIGH (ref 1.7–7.7)
Platelets: 458 10*3/uL — ABNORMAL HIGH (ref 150–400)
RBC: 3.54 MIL/uL — AB (ref 4.22–5.81)
RDW: 16 % — ABNORMAL HIGH (ref 11.5–15.5)
WBC: 16.1 10*3/uL — AB (ref 4.0–10.5)

## 2017-12-02 LAB — COMPREHENSIVE METABOLIC PANEL
ALBUMIN: 2 g/dL — AB (ref 3.5–5.0)
ALK PHOS: 78 U/L (ref 38–126)
ALT: 61 U/L (ref 17–63)
ANION GAP: 11 (ref 5–15)
AST: 161 U/L — AB (ref 15–41)
BUN: 12 mg/dL (ref 6–20)
CALCIUM: 8.5 mg/dL — AB (ref 8.9–10.3)
CO2: 23 mmol/L (ref 22–32)
Chloride: 99 mmol/L — ABNORMAL LOW (ref 101–111)
Creatinine, Ser: 0.93 mg/dL (ref 0.61–1.24)
GFR calc Af Amer: >60 mL/min (ref >60)
GFR calc non Af Amer: >60 mL/min (ref >60)
GLUCOSE: 95 mg/dL (ref 65–99)
POTASSIUM: 3.6 mmol/L (ref 3.5–5.1)
SODIUM: 133 mmol/L — AB (ref 135–145)
Total Bilirubin: 0.9 mg/dL (ref 0.3–1.2)
Total Protein: 6.4 g/dL — ABNORMAL LOW (ref 6.5–8.1)

## 2017-12-02 MED ORDER — FUROSEMIDE 20 MG PO TABS
20.0000 mg | ORAL_TABLET | Freq: Every day | ORAL | Status: DC
  Administered 2017-12-03: 20 mg via ORAL
  Filled 2017-12-02: qty 1

## 2017-12-02 MED ORDER — DULOXETINE HCL 30 MG PO CPEP: 90.0000 mg | ORAL_CAPSULE | Freq: Every day | ORAL | 0 refills | Status: DC

## 2017-12-02 MED ORDER — BUDESONIDE 0.5 MG/2ML IN SUSP: 0.5000 mg | Freq: Two times a day (BID) | RESPIRATORY_TRACT | 12 refills | Status: DC

## 2017-12-02 MED ORDER — CEFDINIR 300 MG PO CAPS: 300.0000 mg | ORAL_CAPSULE | Freq: Two times a day (BID) | ORAL | 0 refills | Status: AC

## 2017-12-02 MED ORDER — ARFORMOTEROL TARTRATE 15 MCG/2ML IN NEBU: 15.0000 ug | INHALATION_SOLUTION | Freq: Two times a day (BID) | RESPIRATORY_TRACT | 0 refills | Status: DC

## 2017-12-02 MED ORDER — IPRATROPIUM BROMIDE 0.02 % IN SOLN: 0.5000 mg | Freq: Four times a day (QID) | RESPIRATORY_TRACT | 12 refills | Status: DC | PRN

## 2017-12-02 NOTE — Progress Notes (Signed)
D/W Dr. Peri Maris of psychiatry and patient has overall capacity to make medical decisions-please see my pended discharge summary for 12/03/17 and if no further mental status changes and is able to orient well can go home 12/03/17

## 2017-12-02 NOTE — Discharge Instructions (Signed)
Discharge Instructions: ° °1. You may shower, please wash incisions daily with soap and water and keep dry.  If you wish to cover wounds with dressing you may do so but please keep clean and change daily.  No tub baths or swimming until incisions have completely healed.  If your incisions become red or develop any drainage please call our office at [phone] ° °2. No Driving until cleared by Dr. Gerhardt's office and you are no longer using narcotic pain medications ° °3. Fever of 101.5 for at least 24 hours with no source, please contact our office at [phone] ° °4. Activity- up as tolerated, please walk at least 3 times per [place].  Avoid strenuous activity, no lifting, pushing, or pulling with your arms over 8-10 lbs for a minimum of 6 weeks ° °5. If any questions or concerns arise, please do not hesitate to contact our office at [phone] °

## 2017-12-02 NOTE — Progress Notes (Signed)
Physical Therapy Treatment Patient Details Name: Brad Turner MRN: [id number] DOB: July 30, 1941 Today's Date: 12/02/2017    History of Present Illness Pt is a 77 y.o. male admitted 11/21/17 with acute respiratory failure secondary to HAP and pleural effusion in setting of COPD. Also with sepsis. Now s/p VATS/empyema and video bronchoscopy on 3/25. PMH includes HTN, COPD, chronic back pain, CAD, depression, neuropathy, macular degeneration, CVA (1995), lumbar laminectomy (2016).   PT Comments    Pt states "feeling lousy" requiring max encouragement for OOB mobility; only motivated by needing to have BM, but urgency too great to amb into bathroom. MinA to stand pivot to [facility] with HHA; pt with very poor safety awareness requiring multiple cues for technique. Unsuccessful BM, ambulating to chair with RW and minA and wanting to rest until he could attempt BSC again. Increased time spent discussing PT recommendation for SNF-level therapies, as pt has no support available and is not safe to return home. His main concern is that he will not be able to afford SNF.    Follow Up Recommendations  SNF;Supervision/Assistance - 24 hour(unsure he can afford)     Equipment Recommendations  (TBD next venue)    Recommendations for Other Services       Precautions / Restrictions Precautions Precautions: Fall Restrictions Weight Bearing Restrictions: No    Mobility  Bed Mobility Overal bed mobility: Needs Assistance Bed Mobility: Supine to Sit     Supine to sit: Min assist     General bed mobility comments: Pt stating he needed to get up to use bathroom, but could only last another 1-2 min, aka no time for PT to get RW to walk into bathroom, although taking ~3-5 minutes to perform bed mobility with no sense of urgency. MinA to assist hips to EOB  Transfers Overall transfer level: Needs assistance Equipment used: 1 person hand held assist;Rolling walker (2 wheeled) Transfers: Sit to/from [employer] Sit to Stand: Min assist Stand pivot transfers: Min assist       General transfer comment: MinA to assist trunk elevation. MinA with HHA to pivot to [facility]. MinA to stand from [facility] with RW; poor safety awareness with eccentric control despite cues for technique, uncontrolled descent into recliner  Ambulation/Gait Ambulation/Gait assistance: Min guard Ambulation Distance (Feet): 5 Feet Assistive device: Rolling walker (2 wheeled) Gait Pattern/deviations: Step-to pattern;Antalgic Gait velocity: Decreased Gait velocity interpretation: <1.8 ft/sec, indicative of risk for recurrent falls General Gait Details: Slow, antalgic amb in room with RW and min guard for balance. Pt only willing to amb to recliner while he waits to have bowel movement; declining ambulation into bathroom due to urgency. Poor safety awareness throughout requiring multiple redirections   Stairs            Wheelchair Mobility    Modified Rankin (Stroke Patients Only)       Balance Overall balance assessment: Needs assistance   Sitting balance-Leahy Scale: Fair       Standing balance-Leahy Scale: Poor Standing balance comment: Reliant on UE support                            Cognition Arousal/Alertness: Awake/alert Behavior During Therapy: Flat affect Overall Cognitive Status: No family/caregiver present to determine baseline cognitive functioning Area of Impairment: Attention;Memory;Following commands;Safety/judgement;Awareness;Problem solving                   Current Attention Level: Sustained Memory: Decreased recall of precautions;Decreased  short-term memory Following Commands: Follows one step commands with increased time Safety/Judgement: Decreased awareness of safety;Decreased awareness of deficits Awareness: Emergent Problem Solving: Slow processing;Decreased initiation;Difficulty sequencing;Requires tactile cues;Requires verbal cues General Comments:  apparent cognitive deficits      Exercises      General Comments        Pertinent Vitals/Pain Pain Assessment: Faces Faces Pain Scale: Hurts a little bit Pain Location: Back Pain Descriptors / Indicators: Sore Pain Intervention(s): Limited activity within patient's tolerance    Home Living                      Prior Function            PT Goals (current goals can now be found in the care plan section) Acute Rehab PT Goals Patient Stated Goal: Have bowel movement PT Goal Formulation: With patient Time For Goal Achievement: 12/11/17 Potential to Achieve Goals: Fair Progress towards PT goals: Progressing toward goals    Frequency    Min 2X/week      PT Plan Current plan remains appropriate    Co-evaluation              AM-PAC PT "6 Clicks" Daily Activity  Outcome Measure  Difficulty turning over in bed (including adjusting bedclothes, sheets and blankets)?: None Difficulty moving from lying on back to sitting on the side of the bed? : Unable Difficulty sitting down on and standing up from a chair with arms (e.g., wheelchair, bedside commode, etc,.)?: A Little Help needed moving to and from a bed to chair (including a wheelchair)?: A Little Help needed walking in hospital room?: A Little Help needed climbing 3-5 steps with a railing? : Total 6 Click Score: 15    End of Session Equipment Utilized During Treatment: Gait belt Activity Tolerance: Patient limited by fatigue Patient left: in chair;with call bell/phone within reach;with chair alarm set Nurse Communication: Mobility status PT Visit Diagnosis: Other abnormalities of gait and mobility (R26.89);Muscle weakness (generalized) (M62.81)     Time: 2637-8588 PT Time Calculation (min) (ACUTE ONLY): 25 min  Charges:  $Therapeutic Activity: 23-37 mins                    G Codes:      Mabeline Caras, PT, DPT Acute Rehab Services  Pager: [place] 12/02/2017, 11:49  AM

## 2017-12-02 NOTE — Progress Notes (Addendum)
      [address]       [place] [address]             [phone]      8 Days Post-Op Procedure(s) (LRB): VIDEO ASSISTED THORACOSCOPY (VATS)/EMPYEMA (Left) VIDEO BRONCHOSCOPY (N/A) CHEST EXPLORATION (Left)   Subjective:  No new complaints.  States he is cold this morning.  Objective: Vital signs in last 24 hours: Temp:  [97.3 F (36.3 C)-99.2 F (37.3 C)] 98 F (36.7 C) (04/02 0343) Pulse Rate:  [80-91] 82 (04/02 0412) Cardiac Rhythm: Normal sinus rhythm (04/02 0705) Resp:  [14-25] 19 (04/02 0412) BP: (127-176)/(87-108) 156/100 (04/02 0412) SpO2:  [91 %-95 %] 93 % (04/02 0713) Weight:  [126 lb 15.8 oz (57.6 kg)] 126 lb 15.8 oz (57.6 kg) (04/02 0500)  Intake/Output from previous Pence: 04/01 0701 - 04/02 0700 In: 30 [I.V.:30] Out: 2650 [Urine:2650]  General appearance: alert, cooperative and no distress Heart: regular rate and rhythm Lungs: diminished breath sounds LLL Abdomen: soft, non-tender; bowel sounds normal; no masses,  no organomegaly Extremities: extremities normal, atraumatic, no cyanosis or edema Wound: clean and dry  Lab Results: Recent Labs    11/30/17 0500 12/02/17 0611  WBC 11.9* 16.1*  HGB 10.1* 10.6*  HCT 30.8* 32.5*  PLT 406* 458*   BMET:  Recent Labs    12/01/17 0513 12/02/17 0611  NA 135 133*  K 4.4 3.6  CL 96* 99*  CO2 29 23  GLUCOSE 90 95  BUN 13 12  CREATININE 1.05 0.93  CALCIUM 8.1* 8.5*    PT/INR: No results for input(s): LABPROT, INR in the last 72 hours. ABG    Component Value Date/Time   PHART 7.363 11/25/2017 0356   HCO3 19.5 (L) 11/25/2017 0356   TCO2 20 (L) 11/24/2017 1750   ACIDBASEDEF 4.9 (H) 11/25/2017 0356   O2SAT 98.1 11/25/2017 0356   CBG (last 3)  No results for input(s): GLUCAP in the last 72 hours.  Assessment/Plan: S/P Procedure(s) (LRB): VIDEO ASSISTED THORACOSCOPY (VATS)/EMPYEMA (Left) VIDEO BRONCHOSCOPY (N/A) CHEST EXPLORATION (Left)  1. ID- Empyema- chest tubes removed  yesterday, f/u CXR with stable post surgical scarring, some residual pleural fluid on left, ABX per primary, OR cultures showing Strep VIridans. 2. Wounds are healing without evidence of infection 3. Dispo- patient stable, will follow intermittently to check on wound, care per primary  LOS: 11 days    Brad Turner 12/02/2017 I have seen and examined Brad Turner and agree with the above assessment  and plan.  Brad Isaac MD [facility] [phone] Office [phone] 12/03/2017 8:01 AM

## 2017-12-02 NOTE — Progress Notes (Signed)
Pt confused upon waking to take night time meds.  Given time to reorient.  Pt states he is at an apartment when asked what kind of building he is in, but states " [address] somewhere." Pt oriented to year, but confused to situation.  When this RN asked pt why he was in the hospital he states "I heard they weren't paying you enough so I'm here to help out." , "and I had I bad heart."

## 2017-12-02 NOTE — Consult Note (Signed)
[facility] Consult   Reason for Consult:  AMS and capacity evaluation Referring Physician:  Dr. Verlon Au Patient Identification: Brad Turner MRN:  [id number] Principal Diagnosis: Depression Diagnosis:   Patient Active Problem List   Diagnosis Date Noted  . Pressure injury of skin [L89.90] 12/01/2017  . Empyema of left pleural space ([place]) [J86.9] 11/24/2017  . Empyema, left ([place]) [J86.9] 11/23/2017  . Iron deficiency anemia [D50.9] 11/22/2017  . Acute urinary retention [R33.8] 11/22/2017  . Hypokalemia [E87.6]   . Hypomagnesemia [E83.42]   . Parapneumonic effusion [J18.9, J91.8]   . Sepsis due to pneumonia ([place]) [J18.9, A41.9]   . ETOH abuse [F10.10] 11/21/2017  . Acute respiratory failure ([place]) [J96.00] 11/21/2017  . HCAP (healthcare-associated pneumonia) [J18.9] 11/21/2017  . Sepsis ([place]) [A41.9] 11/21/2017  . Pleural effusion [J90] 11/21/2017  . Acute kidney injury ([place]) [N17.9] 11/21/2017  . Abdominal pain [R10.9] 11/21/2017  . Malnutrition of moderate degree [E44.0] 09/24/2017  . Acute delirium [R41.0] 09/16/2017  . Acute hyponatremia [E87.1] 09/16/2017  . Alcohol withdrawal ([place]) [F10.239] 09/16/2017  . 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 Tobacco abuse [Z72.0] 09/16/2017  . Depression [F32.9] 09/16/2017  . History of CVA (cerebrovascular accident) [Z86.73] 09/16/2017  . History of Barrett's esophagus [Z87.19] 09/16/2017  . 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 Chronic back pain [M54.9, G89.29] 12/01/2016  . S/P lumbar spinal fusion [Z98.1] 12/01/2016  . Dysthymia [F34.1] 12/01/2016  . Radiculopathy [M54.10] 10/17/2016  . COPD mixed type ([place]) [J44.9] 06/10/2016  . Abnormal CT of the chest [R93.89] 06/10/2016  . Dysphagia [R13.10]   . Cigarette smoker [F17.210] 06/07/2015  . Essential hypertension, benign [I10] 10/30/2014    Class: Chronic  . Syncope [R55] 10/29/2014  . Cough syncope [R05]  10/07/2014  . COPD with acute exacerbation ([place]) [J44.1] 09/29/2014  . Pulmonary nodules [R91.8] 11/22/2013  . Chest pain, unspecified [R07.9] 10/15/2013  . Personal history of colonic polyps [Z86.010] 06/04/2010  . COPD (chronic obstructive pulmonary disease) with emphysema ([place]) [J43.9] 02/26/2010  . CHEST PAIN, ATYPICAL [R07.89] 02/26/2010  . BARRETTS ESOPHAGUS [K22.70] 05/18/2009  . HYPERLIPIDEMIA [E78.5] 04/28/2008  . Coronary atherosclerosis [I25.10] 04/28/2008  . CVA [I63.50] 04/28/2008  . GERD [K21.9] 04/28/2008  . ARTHRITIS [M12.9] 04/28/2008  . NEPHROLITHIASIS, HX OF [Z87.442] 04/28/2008    Total Time spent with patient: 1 hour  Subjective:   Brad Turner is a 77 y.o. male patient admitted with acute respiratory failure secondary to HCAP requiring intubation.  HPI:   Per chart review, patient was admitted with acute respiratory failure secondary to HCAP requiring intubation. He was extubated on 3/26. His hospital course has been complicated by AMS. He has a history of heavy alcohol use and bipolar disorder. UDS was positive for benzodiazepines and opiates on admission in January. PMP indicates that he received a short supply of Librium around this time but it does not indicate a prescription for opiates around the same time. He is prescribed Cymbalta 60 mg daily and Seroquel 25 mg qhs. He has been recommended for SNF placement so psychiatry is asked to complete a capacity evaluation to determine if he is capable to make decisions regarding placement. He currently declines SNF placement.   On interview, Brad Turner reports that he was previously confused and thought that this building looked like his home.  He is oriented to person, place and year. He believes that today is March 21st.  He is able  tell the reason why he was  admitted to the hospital.  He reports that his breathing is better today. He is also able to name his medical conditions.  He is able to report the reasons that his  primary team is recommending SNF placement.  He reports that he is weaker than he has been before.  He understands that he needs help with his ADLs.  He is able to identify the risk of returning home.  He reports a risk of falls.  He is able to report how he would reduce his risk of falls as well as what he would do if he falls including calling 911 or his family.  He is planning to get a medical alert bracelet.  Brad Turner reports that his mood is okay although he subsequently reports that he has been a little depressed due to his son having relationship stressors.  He reports a poor appetite. He eats about 5-6 bites of his food and then he is full.  He reports a poor appetite since his spinal fusion surgery in February 2017.  He reports weight loss.  He reports an improvement in his sleep since his pain improved from his spinal fusion surgery.  He denies SI, HI or AVH.  Past Psychiatric History: Depression and alcohol abuse.   Risk to Self: Is patient at risk for suicide?: No Risk to Others:  None.  Denies HI.  Prior Inpatient Therapy:  Denies Prior Outpatient Therapy:  His medications are managed by his PCP.  Past Medical History:  Past Medical History:  Diagnosis Date  . Alcohol withdrawal ([place])   . Allergy    seasonal  . Arthritis   . Barrett esophagus   . BPH (benign prostatic hyperplasia)   . CAD (coronary artery disease)    a. s/p multiple POBA last in 2011 to distal OM2. b. Takotsubo event 09/2017 with cath showing no culprit, 25% prox LAD, 60% OM1, 65% OM2, EF 35-40% by cath and 30-35% by echo.  . Chronic back pain   . COPD (chronic obstructive pulmonary disease) (H[facility])    Albuterol inhaler as needed.Uses Symbicort daily  . Depression   . Emphysema lung ([place])   . Fall    fell on 10/06/16 with loss of consiousness for a short time.States he remembers getting up but remembers nothing else.  . Gastric ulcer   . Gastritis   . GERD (gastroesophageal reflux disease)    takes Omeprazole  daily  . History of colon polyps    benign  . History of kidney stones   . Hyperlipidemia   . Hypertension   . Hyponatremia   . Joint pain   . Joint swelling   . 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 Neuropathy   . Nocturia   . Pneumonia    hx of  . PONV (postoperative nausea and vomiting)   . Skin cancer   . Stroke [facility]) 1995   Affected balance; and has very emotional if watching a movie  . Syncope    yr ago  . Takotsubo cardiomyopathy    a. 09/2017 - EF 35-40% by cath, 30-35% by echo.  . Urgency of urination    takes Myrbetriq daily    Past Surgical History:  Procedure Laterality Date  . APPENDECTOMY    . CARDIAC CATHETERIZATION     x 4. Most recent one in 2011 at [facility](3 previous were done 20 yrs before)  . cataracts Bilateral   .  CHEST EXPLORATION Left 11/24/2017   Procedure: CHEST EXPLORATION;  Surgeon: [name], MD;  Location: [facility];  Service: Thoracic;  Laterality: Left;  . COLONOSCOPY    . CYSTOSCOPY  07/21/2013   Dr. Jeffie Pollock  . DENTAL SURGERY     with sedation  . ESOPHAGEAL MANOMETRY N/A 09/20/2015   Procedure: ESOPHAGEAL MANOMETRY (EM);  Surgeon: Manus Gunning, MD;  Location: [facility];  Service: Gastroenterology;  Laterality: N/A;  . LEFT HEART CATH AND CORONARY ANGIOGRAPHY N/A 09/15/2017   Procedure: LEFT HEART CATH AND CORONARY ANGIOGRAPHY;  Surgeon: Martinique, Peter M, MD;  Location: [facility];  Service: Cardiovascular;  Laterality: N/A;  . LUMBAR LAMINECTOMY/DECOMPRESSION MICRODISCECTOMY N/A 01/12/2015   Procedure: LUMBAR LAMINECTOMY/DECOMPRESSION MICRODISCECTOMY 3 LEVELS;  Surgeon: Phylliss Bob, MD;  Location: [facility];  Service: Orthopedics;  Laterality: N/A;  Lumbar 3-4, lumbar 4-5, lumbar 5-sacrum 1 decompression  . parathyroid adenoma removed    . POLYPECTOMY    . ROTATOR CUFF REPAIR     bilateral  . TONSILLECTOMY    . UPPER GASTROINTESTINAL ENDOSCOPY    . VIDEO ASSISTED THORACOSCOPY  (VATS)/EMPYEMA Left 11/24/2017   Procedure: VIDEO ASSISTED THORACOSCOPY (VATS)/EMPYEMA;  Surgeon: [name], MD;  Location: [facility];  Service: Thoracic;  Laterality: Left;  Marland Kitchen VIDEO BRONCHOSCOPY N/A 11/24/2017   Procedure: VIDEO BRONCHOSCOPY;  Surgeon: [name], MD;  Location[facility];  Service: Thoracic;  Laterality: N/A;   Family History:  Family History  Problem Relation Age of Onset  . Stroke Mother   . Rheum arthritis Mother   . Heart disease Father   . Colon cancer Unknown        ? mat. uncle died age 2  . Colon polyps Neg Hx   . Esophageal cancer Neg Hx   . Rectal cancer Neg Hx   . Stomach cancer Neg Hx    Family Psychiatric  History: Denies Social History:  Social History   Substance and Sexual Activity  Alcohol Use Yes  . Alcohol/week: 8.4 oz  . Types: 14 Standard drinks or equivalent per week   Comment: every Handel, vodka- 1-2 a Llamas      Social History   Substance and Sexual Activity  Drug Use No    Social History   Socioeconomic History  . Marital status: Single    Spouse name: Not on file  . Number of children: 1  . Years of education: Not on file  . Highest education level: Not on file  Occupational History  . Occupation: Network engineer OF [employer]    Employer: [employer]  Social Needs  . Financial resource strain: Not on file  . Food insecurity:    Worry: Not on file    Inability: Not on file  . Transportation needs:    Medical: Not on file    Non-medical: Not on file  Tobacco Use  . Smoking status: Current Every Beveridge Smoker    Packs/[place]: 1.50    Years: 58.00    Pack years: 87.00    Types: Cigarettes  . Smokeless tobacco: Never Used  Substance and Sexual Activity  . Alcohol use: Yes    Alcohol/week: 8.4 oz    Types: 14 Standard drinks or equivalent per week    Comment: every [place], vodka- 1-2 a [place]   . Drug use: No  . Sexual activity: Not on file  Lifestyle  . Physical activity:    Days per week: Not on file    Minutes per  session:  Not on file  . Stress: Not on file  Relationships  . Social connections:    Talks on phone: Not on file    Gets together: Not on file    Attends religious service: Not on file    Active member of club or organization: Not on file    Attends meetings of clubs or organizations: Not on file    Relationship status: Not on file  Other Topics Concern  . Not on file  Social History Narrative  . Not on file   Additional Social History: He lives at home alone.  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  He has 2 grown daughters and 1 son.  He retired at 67 y/o.  He reports a history of heavy alcohol use.  He currently drinks 2-3 glasses of hard liquor nightly.  He denies a history of DTs or seizures.  He has a history of a DUI 5 years ago.  He denies illicit substance use.    Allergies:   Allergies  Allergen Reactions  . Ivp Dye [Iodinated Diagnostic Agents] Hives and Shortness Of Breath    reaction was when pt was 77 years old.  Clancy Gourd [Nitrofurantoin] Other (See Comments)    Fever that lasted several months    Labs:  Results for orders placed or performed during the hospital encounter of 11/21/17 (from the past 48 hour(s))  Renal function panel     Status: Abnormal   Collection Time: 12/01/17  5:13 AM  Result Value Ref Range   Sodium 135 135 - 145 mmol/L   Potassium 4.4 3.5 - 5.1 mmol/L   Chloride 96 (L) 101 - 111 mmol/L   CO2 29 22 - 32 mmol/L   Glucose, Bld 90 65 - 99 mg/dL   BUN 13 6 - 20 mg/dL   Creatinine, Ser 1.05 0.61 - 1.24 mg/dL   Calcium 8.1 (L) 8.9 - 10.3 mg/dL   Phosphorus 4.0 2.5 - 4.6 mg/dL   Albumin 1.8 (L) 3.5 - 5.0 g/dL   GFR calc non Af Amer >60 >60 mL/min   GFR calc Af Amer >60 >60 mL/min    Comment: (NOTE) The eGFR has been calculated using the CKD EPI equation. This calculation has not been validated in all clinical situations. eGFR's persistently <60 mL/min signify possible Chronic Kidney Disease.    Anion gap 10 5 - 15     Comment: Performed at [facility] [address], [place], [place] [address]  Magnesium     Status: Abnormal   Collection Time: 12/01/17  5:13 AM  Result Value Ref Range   Magnesium 2.5 (H) 1.7 - 2.4 mg/dL    Comment: Performed at [facility] [address] [place], [place] [address]  CBC with Differential/Platelet     Status: Abnormal   Collection Time: 12/02/17  6:11 AM  Result Value Ref Range   WBC 16.1 (H) 4.0 - 10.5 K/uL   RBC 3.54 (L) 4.22 - 5.81 MIL/uL   Hemoglobin 10.6 (L) 13.0 - 17.0 g/dL   HCT 32.5 (L) 39.0 - 52.0 %   MCV 91.8 78.0 - 100.0 fL   MCH 29.9 26.0 - 34.0 pg   MCHC 32.6 30.0 - 36.0 g/dL   RDW 16.0 (H) 11.5 - 15.5 %   Platelets 458 (H) 150 - 400 K/uL   Neutrophils Relative % 79 %   Neutro Abs 12.6 (H) 1.7 - 7.7 K/uL  Lymphocytes Relative 13 %   Lymphs Abs 2.2 0.7 - 4.0 K/uL   Monocytes Relative 8 %   Monocytes Absolute 1.3 (H) 0.1 - 1.0 K/uL   Eosinophils Relative 0 %   Eosinophils Absolute 0.1 0.0 - 0.7 K/uL   Basophils Relative 0 %   Basophils Absolute 0.0 0.0 - 0.1 K/uL    Comment: Performed at [facility] [address], [place], [place] [address]  Comprehensive metabolic panel     Status: Abnormal   Collection Time: 12/02/17  6:11 AM  Result Value Ref Range   Sodium 133 (L) 135 - 145 mmol/L   Potassium 3.6 3.5 - 5.1 mmol/L   Chloride 99 (L) 101 - 111 mmol/L   CO2 23 22 - 32 mmol/L   Glucose, Bld 95 65 - 99 mg/dL   BUN 12 6 - 20 mg/dL   Creatinine, Ser 0.93 0.61 - 1.24 mg/dL   Calcium 8.5 (L) 8.9 - 10.3 mg/dL   Total Protein 6.4 (L) 6.5 - 8.1 g/dL   Albumin 2.0 (L) 3.5 - 5.0 g/dL   AST 161 (H) 15 - 41 U/L   ALT 61 17 - 63 U/L   Alkaline Phosphatase 78 38 - 126 U/L   Total Bilirubin 0.9 0.3 - 1.2 mg/dL   GFR calc non Af Amer >60 >60 mL/min   GFR calc Af Amer >60 >60 mL/min    Comment: (NOTE) The eGFR has been calculated using the CKD EPI equation. This calculation has not been validated in all clinical  situations. eGFR's persistently <60 mL/min signify possible Chronic Kidney Disease.    Anion gap 11 5 - 15    Comment: Performed at [facility] [address], [place],  [address]    Current Facility-Administered Medications  Medication Dose Route Frequency Provider Last Rate Last Dose  . acetaminophen (TYLENOL) tablet 500 mg  500 mg Oral Q6H PRN Nita Sells, MD   500 mg at 11/29/17 0933  . alum & mag hydroxide-simeth (MAALOX/MYLANTA) 200-200-20 MG/5ML suspension 30 mL  30 mL Oral Once Neila Gear, NP   Stopped at 11/30/17 0103  . arformoterol (BROVANA) nebulizer solution 15 mcg  15 mcg Nebulization BID Lars Pinks M, PA-C   15 mcg at 12/02/17 7342  . atorvastatin (LIPITOR) tablet 80 mg  80 mg Oral Daily Lars Pinks M, PA-C   80 mg at 12/02/17 1044  . bisacodyl (DULCOLAX) EC tablet 10 mg  10 mg Oral Daily Lars Pinks M, PA-C   10 mg at 12/02/17 1043  . budesonide (PULMICORT) nebulizer solution 0.5 mg  0.5 mg Nebulization BID Lars Pinks M, PA-C   0.5 mg at 12/02/17 8768  . cefdinir (OMNICEF) capsule 300 mg  300 mg Oral Q12H Nita Sells, MD   300 mg at 12/02/17 1043  . Chlorhexidine Gluconate Cloth 2 % PADS 6 each  6 each Topical Daily [name], MD   6 each at 11/29/17 1700  . clopidogrel (PLAVIX) tablet 75 mg  75 mg Oral Daily Nita Sells, MD   75 mg at 12/02/17 1043  . DULoxetine (CYMBALTA) DR capsule 60 mg  60 mg Oral Daily Lars Pinks M, PA-C   60 mg at 12/02/17 1043  . feeding supplement (ENSURE ENLIVE) (ENSURE ENLIVE) liquid 237 mL  237 mL Oral TID BM Samtani, Jai-Gurmukh, MD   237 mL at 12/02/17 1045  . furosemide (LASIX) tablet 40 mg  40 mg Oral Daily Nita Sells, MD  40 mg at 12/02/17 1044  . guaiFENesin (MUCINEX) 12 hr tablet 1,200 mg  1,200 mg Oral BID Lars Pinks M, PA-C   1,200 mg at 12/02/17 1043  . hydrALAZINE (APRESOLINE) injection 10-20 mg  10-20 mg Intravenous Q4H  PRN Wilhelmina Mcardle, MD   10 mg at 12/02/17 0348  . HYDROcodone-acetaminophen (NORCO/VICODIN) 5-325 MG per tablet 1-2 tablet  1-2 tablet Oral Q4H PRN Ivin Poot, MD   2 tablet at 12/01/17 0620  . ipratropium (ATROVENT) nebulizer solution 0.5 mg  0.5 mg Nebulization Q6H PRN Milly Jakob, MD      . labetalol (NORMODYNE,TRANDATE) injection 10 mg  10 mg Intravenous Q4H PRN Shearon Stalls, Rahul P, PA-C   10 mg at 11/27/17 2305  . levalbuterol (XOPENEX) nebulizer solution 0.63 mg  0.63 mg Nebulization Q6H PRN Milly Jakob, MD      . loperamide (IMODIUM) capsule 2-4 mg  2-4 mg Oral PRN Nani Skillern, PA-C   4 mg at 11/28/17 1545  . loratadine (CLARITIN) tablet 10 mg  10 mg Oral Daily Lars Pinks M, PA-C   10 mg at 12/02/17 1044  . metoprolol succinate (TOPROL-XL) 24 hr tablet 25 mg  25 mg Oral Daily Barrett, Erin R, PA-C   25 mg at 12/02/17 1043  . mirabegron ER (MYRBETRIQ) tablet 50 mg  50 mg Oral Daily Lars Pinks M, PA-C   50 mg at 12/02/17 1043  . multivitamin with minerals tablet 1 tablet  1 tablet Oral Daily Nita Sells, MD   1 tablet at 12/02/17 1043  . nicotine (NICODERM CQ - dosed in mg/24 hours) patch 21 mg  21 mg Transdermal Daily Lars Pinks M, PA-C   21 mg at 12/02/17 1037  . ondansetron (ZOFRAN) injection 4 mg  4 mg Intravenous Q6H PRN Lars Pinks M, PA-C   4 mg at 11/29/17 0825  . pantoprazole (PROTONIX) EC tablet 40 mg  40 mg Oral Q0600 Nani Skillern, PA-C   40 mg at 12/01/17 1275  . potassium chloride SA (K-DUR,KLOR-CON) CR tablet 40 mEq  40 mEq Oral Daily Nita Sells, MD   40 mEq at 12/02/17 1043  . QUEtiapine (SEROQUEL) tablet 25 mg  25 mg Oral QHS Lars Pinks M, PA-C   25 mg at 12/01/17 2151  . senna-docusate (Senokot-S) tablet 1 tablet  1 tablet Oral QHS Nani Skillern, PA-C   1 tablet at 11/24/17 2121  . sodium chloride flush (NS) 0.9 % injection 10-40 mL  10-40 mL Intracatheter Q12H [name], MD   20 mL at 12/01/17 1024  . sodium chloride flush (NS) 0.9 % injection 10-40 mL  10-40 mL Intracatheter PRN [name], MD   10 mL at 11/25/17 1348  . sorbitol 70 % solution 30 mL  30 mL Oral Daily PRN Nani Skillern, PA-C        Musculoskeletal: Strength & Muscle Tone: decreased due to physical deconditioning.  Gait & Station: UTA due to patient lying in bed. Patient leans: N/A  Psychiatric Specialty Exam: Physical Exam  Nursing note and vitals reviewed. Constitutional: He is oriented to person, place, and time. He appears well-developed.  Thin  HENT:  Head: Normocephalic and atraumatic.  Neck: Normal range of motion.  Respiratory: Effort normal.  Musculoskeletal: Normal range of motion.  Neurological: He is alert and oriented to person, place, and time.  Psychiatric: He has a normal mood and affect. His speech is normal and behavior is normal. Judgment and thought  content normal. Cognition and memory are impaired.    Review of Systems  Cardiovascular: Negative for chest pain.  Gastrointestinal: Positive for diarrhea. Negative for abdominal pain, constipation, nausea and vomiting.  Psychiatric/Behavioral: Positive for depression and substance abuse. Negative for hallucinations and suicidal ideas. The patient is nervous/anxious. The patient does not have insomnia.   All other systems reviewed and are negative.   Blood pressure 125/88, pulse 82, temperature 97.6 F (36.4 C), temperature source Oral, resp. rate 20, height '5\' 5"'  (1.651 m), weight 57.6 kg (126 lb 15.8 oz), SpO2 93 %.Body mass index is 21.13 kg/m.  General Appearance: Fairly Groomed, thin, elderly, Caucasian male, wearing a hospital gown with unshaved face and corrective lenses who is lying in bed. NAD.   Eye Contact:  Good  Speech:  Clear and Coherent and Normal Rate  Volume:  Normal  Mood:  "I'm a little depressed."  Affect:  Appropriate and Full Range  Thought Process:  Goal Directed, Linear  and Descriptions of Associations: Intact  Orientation:  Full (Time, Place, and Person)  Thought Content:  Logical  Suicidal Thoughts:  No  Homicidal Thoughts:  No  Memory:  Immediate;   Fair Recent;   Good Remote;   Good  Judgement:  Fair  Insight:  Fair  Psychomotor Activity:  Normal  Concentration:  Concentration: Good and Attention Span: Good  Recall:  [employer] of Knowledge:  Fair  Language:  Good  Akathisia:  No  Handed:  Right  AIMS (if indicated):   N/A  Assets:  Communication Skills Housing Social Support  ADL's:  Impaired  Cognition: Impaired with short term memory deficits.   Sleep:   Okay   Assessment:  Brad Turner is a 77 y.o. male who was admiited with acute respiratory failure secondary to HCAP requiring intubation.  Psychiatry was asked to assess for capacity to refuse SNF.  Patient demonstrates capacity to refuse placement.  He is able to identify the reason why it is recommended as well as the risk and benefits of placements and discharging home.  Treatment Plan Summary: -Increase Cymbalta 60 mg daily to 90 mg daily for depression and anxiety. -Patient demonstrates capacity to refuse SNF placement.  Please see assessment for further details. -Patient is psychiatrically cleared.  Psychiatry will sign off on patient at this time.  Please consult psychiatry again as needed.  Disposition: No evidence of imminent risk to self or others at present.   Patient does not meet criteria for psychiatric inpatient admission.  Faythe Dingwall, DO 12/02/2017 1:42 PM

## 2017-12-02 NOTE — Discharge Summary (Signed)
Physician Discharge Summary  Brad Turner OEV:[id number] DOB: 09-20-1940 DOA: 11/21/2017  PCP: Haywood Pao, MD  Admit date: 11/21/2017 Discharge date: 12/03/2017  Time spent: *45** minutes  Recommendations for Outpatient Follow-up:  1. Complete antibiotics 4/12, get x-ray 1-2 weeks and follow-up CVTS as outpatient 2. Increase Cymbalta 60-->90 this admission-outpatient management per PCP 3. Note dosage change of Lasix to 20 mg 4. Need basic metabolic panel 1 week,'s CBC 1 week 5. Patient has capacity to make medical decisions 6. 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   Tobacco abuse   ETOH abuse   Acute respiratory failure (HCC)   HCAP (healthcare-associated pneumonia)   Sepsis (HCC)   Pleural effusion   Acute kidney injury ([place])   Abdominal pain   Iron deficiency anemia   Acute urinary retention   Hypokalemia   Hypomagnesemia   Parapneumonic effusion   Sepsis due to pneumonia (HCC)   Empyema, left (HCC)   Empyema of left pleural space (HCC)   Pressure injury of skin   Discharge Condition: fair  Diet recommendation: hh low salt  Filed Weights   11/29/17 0500 12/01/17 0500 12/02/17 0500  Weight: 60 kg (132 lb 4.4 oz) 57.7 kg (127 lb 3.3 oz) 57.6 kg (126 lb 15.8 oz)    History of present illness:  77 EtOH abuse, prior Tokotsubo cardiomyopathy EF 30-35%, ST MI status post PCI to OM 10/2009, severe orthostasis [?  Cough syncope-not thought to be this is secondary to pulmonology], dyslipidemia, HTN, HLD, former smoker-COPD Gold 2 1.5 pack/Stroupe-severe emphysema, prior lumbar interbody fusion L4-5 10/1716, Barrett's  esophagus + GERD last EGD nondysplastic Barrett's, last colonoscopy 11/11 1 hyperplastic polyp, remote stroke   Was admitted by cardiology 1/13-1/17 for possible ST MI found to have Takotsubo cardiomyopathy Return to emergency room 1/19 with severe orthostasis #CPR performed at the time Admitted 3/25 with acute respiratory failure secondary to H CAP loculated effusion-needed to be intubated on 3/25 and had left VATS with decortication 3/26 and extubated subsequently   Hospital Course:  Altered mental status-?  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  He is refusing skilled placement and I had a detailed discussion with his son on the phone on 4/2 and patient will not have 24-hour care post discharge. -  Discontinued opiates, at time of psychiatric evaluation patient seemed completely coherent and if this persists patient may be discharged tomorrow 12/03/2017 -I explained this to the son and he understands patient will be discharged for 319 if continues to do well  Acute respiratory failure with hypoxia status post extubation 8/29 Complicated empyema status post VATS 3/26 cxr 3/30 compared to 3/29 less fluid in lung pleural fluid shows viridans strep--Zosyn-->Ceftriaxone--->Cefdinir 300 bid on 3/31 and will continue to cover until 4/12 (3 weeks) White count 16.1, chest x-ray 4/ 2 no pneumothorax persistent airspace opacity left lungs--Afebrile -discontinuing Percocet 4/2 percocet, discontinuing fentanyl 4/1--use for now tylenol if pain  STEMI 2011, Takotsubo cardiomyopathy 09/2015  Chronic hypotension Orthostasis with falls Continue metoprolol  25 XL daily, continue as needed hydralazine 10-20 every 4 as needed Given IV Lasix 40 mg 3/29 IV bid --->40 IV daily to p.o. 40 mg daily 4/1 and continue at lower dose of 20 mg  12/02/17 daily -Net fluid status down from 10 to now?  -11 plavix on since 3/30 no -bleed 2/2 chronic orthostasis no aggressively control bp --continue Midodrine 2.5 3x times daily as outpatient  Severe COPD mixed with emphysema still smokes 1 pack/Escue Continue NicoDerm patch,  This admission was started on multiple meds including Brovana 15 twice daily, Pulmicort 0.5 twice daily, Atrovent 0.5 every 6 as needed, scripts given-- Coughs with diet-cleared for reg diet per SLP monitor  AK I Hypomagnesemia-currently 1.7---replacing with 2 g magnesium sulfate again 3/31 one more time and discontinued as 2.5 on 4/1 Hypophosphatemia-replacing with K-Phos orally twice daily-now 4.0.  Stopping replacement Given IV 4 rounds of K-change to K Dur 40 bid up to 3.6-->changing to 40 po qd 4/1  Blood loss anemia in setting of hyperplastic polyps 2011 and Barrett's esophagus Sp prbc 3/29 hemoglobin 7.7--->9.5--->10.6 Periodic labs as an outpatient  Remote stroke h/o Restart on Plavix 75 daily now  Bipolar continue Cymbalta DR 60 daily, Seroquel 25 at bedtime  Hyperlipidemia continue atorvastatin 80 daily discharge fenofibrate 145   Consultants:   Pulmonary  CV TS  Procedures:   Decortication and VATS 3/26  Antimicrobials:    Zosyn-->cefdinir and stopping 4/12  Discharge Exam: Vitals:   12/03/17 0917 12/03/17 1149  BP:  123/78  Pulse:  79  Resp:  17  Temp:  97.8 F (36.6 C)  SpO2: 97% 97%    General: NAD Cardiovascular: S1, S2 present Respiratory: Diminished breath sounds LLL, chest wound clean and dry  Discharge Instructions   Discharge Instructions    Diet - low sodium heart healthy   Complete by:  As directed    Discharge instructions   Complete by:  As directed    Please follow-up with primary physician and complete antibiotics on 4/16 for your empyema Recommend getting a chest x-ray in 1-2 weeks It is been recommended that you get therapy at a rehab facility  however has you wish to go home we will get you maximum support at home with home health therapy Please note dosage change of Cymbalta to 90 mg daily you will need I would also recommend that you get lab work done in about 1 week at primary physician office You will need various new inhalers to help you with your COPD and it is strongly encouraged that you quit smoking and drinking   Increase activity slowly   Complete by:  As directed      Allergies as of 12/03/2017      Reactions   Ivp Dye [iodinated Diagnostic Agents] Hives, Shortness Of Breath   reaction was when pt was 77 years old.   Macrodantin [nitrofurantoin] Other (See Comments)   Fever that lasted several months      Medication List    STOP taking these medications   tiZANidine 4 MG capsule Commonly known as:  ZANAFLEX   VOLTAREN 1 % Gel Generic drug:  diclofenac sodium     TAKE these medications   albuterol 108 (90 Base) MCG/ACT inhaler Commonly known as:  PROVENTIL HFA;VENTOLIN HFA Inhale 2 puffs into the lungs every 6 (six) hours as needed for wheezing or shortness of breath. Reported on 11/29/2015   arformoterol 15 MCG/2ML Nebu Commonly known as:  BROVANA Take 2 mLs (15  mcg total) by nebulization 2 (two) times daily.   atorvastatin 80 MG tablet Commonly known as:  LIPITOR Take 80 mg by mouth daily.   budesonide 0.5 MG/2ML nebulizer solution Commonly known as:  PULMICORT Take 2 mLs (0.5 mg total) by nebulization 2 (two) times daily.   cefdinir 300 MG capsule Commonly known as:  OMNICEF Take 1 capsule (300 mg total) by mouth every 12 (twelve) hours for 14 days.   clopidogrel 75 MG tablet Commonly known as:  PLAVIX Take 75 mg by mouth daily.   DULoxetine 30 MG capsule Commonly known as:  CYMBALTA Take 3 capsules (90 mg total) by mouth daily. What changed:    medication strength  how much to take   feeding supplement (ENSURE ENLIVE) Liqd Take 237 mLs by mouth 2 (two) times daily between meals.    fenofibrate 145 MG tablet Commonly known as:  TRICOR Take 145 mg by mouth daily.   folic acid 1 MG tablet Commonly known as:  FOLVITE Take 1 tablet (1 mg total) by mouth daily.   furosemide 40 MG tablet Commonly known as:  LASIX Take 0.5 tablets (20 mg total) by mouth daily. What changed:  how much to take   guaiFENesin 600 MG 12 hr tablet Commonly known as:  MUCINEX Take 600 mg by mouth as needed for cough or to loosen phlegm.   ipratropium 0.02 % nebulizer solution Commonly known as:  ATROVENT Take 2.5 mLs (0.5 mg total) by nebulization every 6 (six) hours as needed for wheezing or shortness of breath.   loperamide 2 MG capsule Commonly known as:  IMODIUM Take 1-2 capsules (2-4 mg total) by mouth as needed for diarrhea or loose stools.   metoprolol succinate 50 MG 24 hr tablet Commonly known as:  TOPROL-XL Take 1 tablet (50 mg total) by mouth daily. Take with or immediately following a meal.   midodrine 2.5 MG tablet Commonly known as:  PROAMATINE Take 1 tablet (2.5 mg total) by mouth 3 (three) times daily with meals.   MYRBETRIQ 50 MG Tb24 tablet Generic drug:  mirabegron ER Take 50 mg by mouth daily.   NITROSTAT 0.4 MG SL tablet Generic drug:  nitroGLYCERIN Place 0.4 mg under the tongue every 5 (five) minutes as needed for chest pain. Reported on 11/29/2015   omeprazole 40 MG capsule Commonly known as:  PRILOSEC TAKE ONE CAPSULE BY MOUTH TWICE DAILY   QUEtiapine 25 MG tablet Commonly known as:  SEROQUEL Take 1 tablet (25 mg total) by mouth at bedtime.   SYMBICORT 160-4.5 MCG/ACT inhaler Generic drug:  budesonide-formoterol INHALE TWO PUFFS INTO LUNGS TWICE DAILY   thiamine 100 MG tablet Take 1 tablet (100 mg total) by mouth daily.   TYLENOL 8 HOUR 650 MG CR tablet Generic drug:  acetaminophen Take 650 mg by mouth every 8 (eight) hours as needed for pain.   vitamin B-12 1000 MCG tablet Commonly known as:  CYANOCOBALAMIN Take 1 tablet (1,000 mcg total)  by mouth daily.      Allergies  Allergen Reactions  . Ivp Dye [Iodinated Diagnostic Agents] Hives and Shortness Of Breath    reaction was when pt was 77 years old.  Clancy Gourd [Nitrofurantoin] Other (See Comments)    Fever that lasted several months   Follow-up Information    [name], MD Follow up on 01/08/2018.   Specialty:  Cardiothoracic Surgery Why:  Appointment is at 2:30, please get CXR at 2:00 at [place] located on first floor of  our office building Contact information: [address] [place] [address] [place], [place] Follow up.   Specialty:  Home Health Services Why:  Registered Nurse, Nurse Aide, Physical therpay and social work Contact information: [place] [address] [phone]        Haywood Pao, MD. Schedule an appointment as soon as possible for a visit in 1 week(s).   Specialty:  Internal Medicine Contact information: [place] [address] [phone]        [name], MD .   Specialty:  Cardiology Contact information: [phone] [address] [place] [address] [phone]            The results of significant diagnostics from this hospitalization (including imaging, microbiology, ancillary and laboratory) are listed below for reference.    Significant Diagnostic Studies: Ct Abdomen Pelvis Wo Contrast  Result Date: 11/21/2017 CLINICAL DATA:  Lower abdominal pain for several weeks.  Diarrhea. EXAM: CT ABDOMEN AND PELVIS WITHOUT CONTRAST TECHNIQUE: Multidetector CT imaging of the abdomen and pelvis was performed following the standard protocol without IV contrast. COMPARISON:  05/01/2017 FINDINGS: Lower chest: Pericardial effusion measuring up to 16 mm in cross-section. Mild calcific atherosclerotic disease of the coronary arteries. Dense airspace consolidation in the left lower lobe,  incompletely visualized with an area of decreased attenuation in the dependent portion of the lung which may represent loculated pleural effusion or area of necrosis. Hepatobiliary: Stable liver cyst. Sludge/gallstones within the dependent portion of the gallbladder. Pancreas: Unremarkable. No pancreatic ductal dilatation or surrounding inflammatory changes. Spleen: Normal in size without focal abnormality. Adrenals/Urinary Tract: Normal adrenal glands. Normal left kidney. Large but benign-appearing right renal cysts, the larger of the 2 measuring 3.5 cm. Stomach/Bowel: Stomach is within normal limits. Post appendectomy. No evidence of bowel wall thickening, distention, or inflammatory changes. Vascular/Lymphatic: Aortic atherosclerosis. No enlarged abdominal or pelvic lymph nodes., Reproductive:  Prostate is unremarkable. Other: No abdominal wall hernia or abnormality. No abdominopelvic ascites. Musculoskeletal: L4-S1 posterior fusion. IMPRESSION: Dense airspace consolidation of the left lower lobe of the lung with hypoattenuated area posteriorly which may represent loculated pleural effusion or area of necrosis. Associated left pleural effusion. Moderate in size pericardial effusion. No evidence of acute abnormalities within the abdomen or pelvis. Electronically Signed   By: Fidela Salisbury M.D.   On: 11/21/2017 16:03   Dg Chest 1 View  Result Date: 11/22/2017 CLINICAL DATA:  Thoracentesis EXAM: CHEST  1 VIEW COMPARISON:  11/21/2017 FINDINGS: There is no pneumothorax post left thoracentesis. Airspace disease throughout the left lung is stable. Opacity at the left base is stable. Right basilar airspace disease increased. Cardiomegaly. IMPRESSION: No pneumothorax post left thoracentesis. Electronically Signed   By: Marybelle Killings M.D.   On: 11/22/2017 14:56   Dg Chest 2 View  Result Date: 12/02/2017 CLINICAL DATA:  Status post chest tube removal. EXAM: CHEST - 2 VIEW COMPARISON:  Portable chest x-ray of  December 01, 2017 FINDINGS: The patient has undergone removal of the left-sided chest tube. There remains hazy increased density in the left mid lung. There is no pneumothorax. There is left lower lobe atelectasis or pneumonia with small left pleural effusion. The right lung is well-expanded. There is patchy increased density in the right perihilar region more conspicuous today. The heart and pulmonary vascularity are normal. There is calcification in the wall of the aortic arch.  Within the upper abdomen there is gas and fluid within bowel. IMPRESSION: No evidence of a pneumothorax on the left since chest tube removal. Persistent airspace opacity in the left mid and lower lung. Subsegmental atelectasis in the right perihilar region. Thoracic aortic atherosclerosis. Electronically Signed   By: David  Martinique M.D.   On: 12/02/2017 08:08   Dg Chest 2 View  Result Date: 11/23/2017 CLINICAL DATA:  Follow-up left pneumonia EXAM: CHEST - 2 VIEW COMPARISON:  11/22/2017 FINDINGS: Cardiac shadow is stable. The right lung remains clear. Increasing left-sided pleural effusion is noted with left basilar infiltrate. Rounded density is noted likely related to fluid within the fissure. No bony abnormality is noted. IMPRESSION: Increasing left-sided pleural effusion Electronically Signed   By: Inez Catalina M.D.   On: 11/23/2017 17:57   Dg Chest 2 View  Result Date: 11/21/2017 CLINICAL DATA:  Abdominal pain and hypertension. Shortness of breath. EXAM: CHEST - 2 VIEW COMPARISON:  September 21, 2017 FINDINGS: There is left pleural effusion with consolidation in portions of the inferior lingula and left lower lobe. Right lung is clear except for slight right base atelectasis. Heart is upper normal in size with pulmonary vascularity within normal limits. No adenopathy. There is aortic atherosclerosis. There is evidence of old trauma involving the lateral left clavicle. IMPRESSION: Left pleural effusion with consolidation in portions of  the lingula and left lower lobe. Slight right base atelectasis. Lungs elsewhere clear. Heart upper normal in size. No evident adenopathy. There is aortic atherosclerosis. Aortic Atherosclerosis (ICD10-I70.0). Electronically Signed   By: Lowella Grip III M.D.   On: 11/21/2017 10:29   Ct Chest Wo Contrast  Result Date: 11/22/2017 CLINICAL DATA:  Left pleural effusion. EXAM: CT CHEST WITHOUT CONTRAST TECHNIQUE: Multidetector CT imaging of the chest was performed following the standard protocol without IV contrast. COMPARISON:  Chest radiograph and abdominal CT earlier this [place]. Chest CT 08/07/2017 FINDINGS: Cardiovascular: Aortic atherosclerosis without aneurysm. Small pericardial effusion that is not significantly changed from abdominal CT. Coronary artery calcifications. Mediastinum/Nodes: Lack of IV contrast limits assessment for hilar adenopathy. Suspect prominent left hilar nodes that are obscured by adjacent lung opacity. Small mediastinal nodes are not enlarged by size criteria. No esophageal thickening. Surgical clip adjacent to the posterior left thyroid lobe. Lungs/Pleura: Left pleural effusion, moderate in size that measures simple fluid density. There is near complete opacification of the left lower lobe. Dependent densities in the upper lobe adjacent to pleural fluid likely atelectasis. Moderate emphysema. No evidence of endobronchial lesion. No pulmonary edema. Mild dependent atelectasis in the right lung base. Scattered calcified granuloma. Upper Abdomen: Evaluated on abdominal CT earlier this [place]. Enteric contrast within the colon. Gallstones. Hepatic and right renal cysts. Musculoskeletal: There are no acute or suspicious osseous abnormalities. IMPRESSION: 1. Moderate left pleural effusion measuring simple fluid density. Associated near complete opacification of the left lower lobe may be atelectasis or pneumonia. Dependent atelectasis in the left upper lobe adjacent to pleural fluid. 2.  Moderate emphysema. 3. Aortic atherosclerosis and coronary artery calcifications. Small pericardial effusion as seen on abdominal CT. Aortic Atherosclerosis (ICD10-I70.0) and Emphysema (ICD10-J43.9). Electronically Signed   By: Jeb Levering M.D.   On: 11/22/2017 02:11   Dg Chest Port 1 View  Result Date: 12/01/2017 CLINICAL DATA:  Chest tube in place EXAM: PORTABLE CHEST 1 VIEW COMPARISON:  November 30, 2017 FINDINGS: Chest tube present on the left. Central catheter tip in superior vena cava. No pneumothorax. There is a left pleural effusion. There is  patchy airspace consolidation in the left mid lower lung zones. Lungs elsewhere clear. Heart is upper normal in size with pulmonary vascularity within normal limits. There is aortic atherosclerosis. No adenopathy. There are surgical clips in the left paratracheal region. Evidence of old trauma involving the lateral left clavicle noted. IMPRESSION: Tube and catheter positions unchanged without pneumothorax. Consolidation in portions of left mid lower lung zones. Small left pleural effusion. No new opacity. Stable cardiac silhouette. There is aortic atherosclerosis. Aortic Atherosclerosis (ICD10-I70.0). Electronically Signed   By: Lowella Grip III M.D.   On: 12/01/2017 08:03   Dg Chest Port 1 View  Result Date: 11/30/2017 CLINICAL DATA:  Chest tube EXAM: PORTABLE CHEST 1 VIEW COMPARISON:  Yesterday FINDINGS: One of 2 chest tubes has been removed. Left pneumothorax that is trace along the lateral and apical chest. History of empyema treatment with stable pleuroparenchymal opacity on the left. The right lung remains clear. Right IJ line with tip at the SVC. Stable heart size. Emphysema IMPRESSION: 1. Trace left pneumothorax. 2. Remaining chest tube in stable position. 3. Stable residual pleuroparenchymal opacity on the left. Electronically Signed   By: Monte Fantasia M.D.   On: 11/30/2017 08:55   Dg Chest Port 1 View  Result Date: 06/08/202019 CLINICAL  DATA:  Chest tube EXAM: PORTABLE CHEST 1 VIEW COMPARISON:  Yesterday FINDINGS: Two left-sided chest tubes in similar position. History of treated empyema with stable pleuroparenchymal opacity on the left. No visible pneumothorax. Stable right IJ line positioning. Stable heart size. Emphysema. IMPRESSION: 1. Stable positioning of chest tubes. 2. Recently treated left empyema with stable pleuroparenchymal opacity. No visible pneumothorax. Electronically Signed   By: Monte Fantasia M.D.   On: 006/08/202019 07:49   Dg Chest Port 1 View  Result Date: 11/28/2017 CLINICAL DATA:  Empyema EXAM: PORTABLE CHEST 1 VIEW COMPARISON:  Yesterday FINDINGS: Artifact from EKG leads. Chest tubes on the left in similar position. Stable hazy pleuroparenchymal density at the left base. No visible pneumothorax. Right IJ central line with tip at the SVC. Background hyperinflation. IMPRESSION: 1. Stable positioning of chest tubes. 2. Stable residual opacity on the left.  No visible pneumothorax. Electronically Signed   By: Monte Fantasia M.D.   On: 11/28/2017 08:08   Dg Chest Port 1 View  Result Date: 11/27/2017 CLINICAL DATA:  Status post LEFT VATS for empyema. EXAM: PORTABLE CHEST 1 VIEW COMPARISON:  11/25/2017 and prior radiographs FINDINGS: An endotracheal tube and NG tube have been removed. A RIGHT IJ central venous catheter with tip overlying the mid SVC, and 2 LEFT thoracostomy tubes are unchanged. There is no definite pneumothorax. Continued LEFT LOWER lung opacity noted. Improved RIGHT basilar aeration and decreased RIGHT pleural effusion, now trace. IMPRESSION: Endotracheal tube and NG tube removal with improved RIGHT basilar aeration and decreased RIGHT pleural effusion, now trace. No other significant change.  No definite pneumothorax. Electronically Signed   By: Margarette Canada M.D.   On: 11/27/2017 08:31   Portable Chest Xray  Result Date: 11/25/2017 CLINICAL DATA:  Acute respiratory failure with hypoxia. Intubated  patient. Status post video-assisted thoracic surgery for empyema drainage. History of coronary artery disease and COPD. Current smoker. EXAM: PORTABLE CHEST 1 VIEW COMPARISON:  Portable chest x-ray of November 24, 2017 FINDINGS: The lungs are adequately inflated. There are bilateral pleural effusions greatest on the left. The heart is top-normal in size. The pulmonary vascularity is not engorged. There is improved aeration of the mid and upper right lung. The interstitial markings remain  increased throughout much of the left lung. The endotracheal tube tip projects 3.9 cm above the carina. The esophagogastric tube tip and proximal port project below the GE junction. The right internal jugular venous catheter tip projects over the midportion of the SVC. IMPRESSION: Increase conspicuity of bilateral pleural effusions. Increased interstitial density at the throughout the left lung worrisome for progressive pneumonia. The support tubes are in reasonable position. Thoracic aortic atherosclerosis. Electronically Signed   By: David  Martinique M.D.   On: 11/25/2017 08:56   Dg Chest Portable 1 View  Result Date: 11/24/2017 CLINICAL DATA:  Postop EXAM: PORTABLE CHEST 1 VIEW COMPARISON:  Portable exam 1700 hours compared to 11/23/2017 FINDINGS: Tips of lung apices excluded. Tip of endotracheal tube projects approximately 4.0 cm above carina, carina poorly visualized. Tip of RIGHT jugular central venous catheter projects over SVC. Pair of LEFT thoracostomy tubes. Normal heart size, mediastinal contours, and pulmonary vascularity. Atherosclerotic calcification aorta. LEFT basilar atelectasis. Scattered subsegmental atelectasis and mid and lower RIGHT lung. No definite pneumothorax. Minimal LEFT pleural effusion again seen. EKG leads project over chest. Retained contrast in upper abdomen. IMPRESSION: No pneumothorax following central line and LEFT thoracostomy tube placement. Persistent LEFT basilar atelectasis and small LEFT  pleural effusion. Scattered subsegmental atelectasis RIGHT lung. No definite retained radiopaque foreign bodies visualized. Electronically Signed   By: Lavonia Dana M.D.   On: 11/24/2017 17:23   Dg Chest Port 1 View  Result Date: 11/21/2017 CLINICAL DATA:  Shortness of breath and chest pain EXAM: PORTABLE CHEST 1 VIEW COMPARISON:  November 21, 2017 FINDINGS: There is consolidation throughout the left mid lower lung zones with left pleural effusion. Right lung is clear. Heart is mildly enlarged with pulmonary vascularity within normal limits. No adenopathy. There is aortic atherosclerosis. There are surgical clips to the left of the paratracheal region at the level of T2. There is evidence of old trauma involving the lateral left clavicle. IMPRESSION: Airspace consolidation most indicative of pneumonia throughout the left mid and lower lung zones with left pleural effusion. Right lung clear. Stable cardiomegaly. There is aortic atherosclerosis. Aortic Atherosclerosis (ICD10-I70.0). Electronically Signed   By: Lowella Grip III M.D.   On: 11/21/2017 12:04   US Thoracentesis Asp Pleural Space W/img Guide  Result Date: 11/22/2017 INDICATION: Acute respiratory failure with hypoxia secondary to healthcare associated pneumonia and loculated pleural effusion. Request for diagnostic and therapeutic thoracentesis. EXAM: ULTRASOUND GUIDED LEFT THORACENTESIS MEDICATIONS: 1% Lidocaine = 12 mL COMPLICATIONS: None immediate. PROCEDURE: An ultrasound guided thoracentesis was thoroughly discussed with the patient and questions answered. The benefits, risks, alternatives and complications were also discussed. The patient understands and wishes to proceed with the procedure. Written consent was obtained. Ultrasound was performed to localize and mark an adequate pocket of fluid in the left chest. The area was then prepped and draped in the normal sterile fashion. 1% Lidocaine was used for local anesthesia. Under ultrasound  guidance a 6 Fr Safe-T-Centesis catheter was introduced. Thoracentesis was performed. The catheter was removed and a dressing applied. FINDINGS: A total of approximately 480 mL of hazy yellow fluid was removed. Samples were sent to the laboratory as requested by the clinical team. IMPRESSION: Successful ultrasound guided left thoracentesis yielding 480 mL of pleural fluid. No pneumothorax on post procedure chest X-ray. Read by: Gareth Eagle, PA-C Electronically Signed   By: Markus Daft M.D.   On: 11/22/2017 15:01    Microbiology: Recent Results (from the past 240 hour(s))  Surgical pcr screen  Status: None   Collection Time: 11/23/17  8:28 PM  Result Value Ref Range Status   MRSA, PCR NEGATIVE NEGATIVE Final   Staphylococcus aureus NEGATIVE NEGATIVE Final    Comment: (NOTE) The Xpert SA Assay (FDA approved for NASAL specimens in patients 62 years of age and older), is one component of a comprehensive surveillance program. It is not intended to diagnose infection nor to guide or monitor treatment. Performed at [facility], [place] [address], [place], [place] [address]   Anaerobic culture     Status: None   Collection Time: 11/24/17 12:31 PM  Result Value Ref Range Status   Specimen Description PLEURAL LEFT LUNG  Final   Special Requests EMPYEMA TISSUE  Final   Culture   Final    ABUNDANT VIRIDANS STREPTOCOCCUS NO ANAEROBES ISOLATED CRITICAL RESULT CALLED TO, READ BACK BY AND VERIFIED WITH: J [facility] AT 8546 11/29/17 BY L BENFIELD    Report Status Nov 12, 202019 FINAL  Final   Organism ID, Bacteria VIRIDANS STREPTOCOCCUS  Final      Susceptibility   Viridans streptococcus - MIC*    PENICILLIN <=0.06 SENSITIVE Sensitive     CEFTRIAXONE 0.25 SENSITIVE Sensitive     ERYTHROMYCIN <=0.12 SENSITIVE Sensitive     LEVOFLOXACIN 0.5 SENSITIVE Sensitive     VANCOMYCIN Value in next row Sensitive      0.5 SENSITIVEPerformed at [place] [address], [place], [place] [address]     * ABUNDANT VIRIDANS STREPTOCOCCUS  Aerobic Culture (superficial specimen)     Status: None   Collection Time: 11/24/17 12:31 PM  Result Value Ref Range Status   Specimen Description PLEURAL LEFT LUNG  Final   Special Requests EMPYEMA TISSUE  Final   Gram Stain   Final    MODERATE WBC PRESENT, PREDOMINANTLY PMN NO ORGANISMS SEEN    Culture   Final    NO GROWTH 2 DAYS Performed at [facility], [place] [address], [place], [place] [address]    Report Status 11/26/2017 FINAL  Final  Acid Fast Smear (AFB)     Status: None   Collection Time: 11/24/17 12:31 PM  Result Value Ref Range Status   AFB Specimen Processing Concentration  Final   Acid Fast Smear Negative  Final    Comment: (NOTE) Performed At: [facility] [place], [place] [id number] Rush Farmer MD RC:[id number]    Source (AFB) BRONCHIAL WASHINGS  Final    Comment: LEFT  Fungus Culture With Stain     Status: None   Collection Time: 11/24/17  1:22 PM  Result Value Ref Range Status   Fungus Stain Final report  Final    Comment: (NOTE) Performed At: [facility] [place], [place] [id number] Rush Farmer MD OE:[id number]    Fungus (Mycology) Culture PENDING  Incomplete   Fungal Source EMPYEMA LEFT PLEURAL FLUID  Corrected    Comment: Performed at [facility], [place] [address], [facility], [place] [address] CORRECTED ON 03/25 AT 1658: PREVIOUSLY REPORTED AS EMPYEMA LEFT   Culture, respiratory (NON-Expectorated)     Status: None   Collection Time: 11/24/17  1:22 PM  Result Value Ref Range Status   Specimen Description BRONCHIAL WASHINGS  Final   Special Requests LEFT  Final   Gram Stain   Final    NO WBC SEEN NO SQUAMOUS EPITHELIAL CELLS SEEN NO ORGANISMS SEEN CORRECTED RESULTS PREVIOUSLY REPORTED AS: ABUNDANT WBC PRESENT,BOTH PMN AND MONONUCLEAR RARE GRAM POSITIVE  COCCI IN PAIRS AND CHAINS Results Called to: Gelene Mink, RN AT 9562 ON 11/24/17.    Culture    Final    NO GROWTH 2 DAYS Performed at [facility], [place] [address], [place], [place] [address]    Report Status 11/26/2017 FINAL  Final  Acid Fast Smear (AFB)     Status: None   Collection Time: 11/24/17  1:22 PM  Result Value Ref Range Status   AFB Specimen Processing Concentration  Final   Acid Fast Smear Negative  Final    Comment: (NOTE) Performed At: [facility] [place], [place] [id number] Rush Farmer MD BM:[id number]    Source (AFB) EMPYEMA LEFT PLEURAL FLUID  Corrected    Comment: Performed at [facility], [place] [address], [place], [place] [address] CORRECTED ON 03/25 AT 6644: PREVIOUSLY REPORTED AS EMPYEMA LEFT   Acid Fast Smear (AFB)     Status: None   Collection Time: 11/24/17  1:22 PM  Result Value Ref Range Status   AFB Specimen Processing Concentration  Final   Acid Fast Smear Negative  Final    Comment: (NOTE) Performed At: [facility] [place], [place] [id number] Rush Farmer MD [id number]    Source (AFB) LEFT  Final    Comment: PLEURAL FLUID EMPYEMA Performed at [facility], [place] [address], [place], [place] [address]   Fungus Culture With Stain     Status: None (Preliminary result)   Collection Time: 11/24/17  1:22 PM  Result Value Ref Range Status   Fungus Stain Final report  Final    Comment: (NOTE) Performed At: [facility] [place], [place] [id number] Rush Farmer MD FU:[id number]    Fungus (Mycology) Culture PENDING  Incomplete   Fungal Source LEFT  Final    Comment: PLEURAL FLUID EMPYEMA Performed at [facility], [place] [address], [place], [place] [address]   Body fluid culture     Status: None   Collection Time: 11/24/17  1:22 PM  Result Value Ref Range Status   Specimen Description PLEURAL FLUID  Final   Special Requests LEFT EMPYEMA  Final   Gram Stain   Final    ABUNDANT WBC PRESENT,BOTH PMN AND MONONUCLEAR NO ORGANISMS SEEN     Culture   Final    NO GROWTH 3 DAYS Performed at [facility], [address] [place], [place] [address]    Report Status 11/27/2017 FINAL  Final  Fungus Culture Result     Status: None   Collection Time: 11/24/17  1:22 PM  Result Value Ref Range Status   Result 1 Comment  Final    Comment: (NOTE) KOH/Calcofluor preparation:  no fungus observed. Performed At: [facility] [place], [place] [id number] Rush Farmer MD OH:[id number] Performed at [facility], [place] [address], [place], [place] [address]   Fungus Culture Result     Status: None   Collection Time: 11/24/17  1:22 PM  Result Value Ref Range Status   Result 1 Comment  Final    Comment: (NOTE) KOH/Calcofluor preparation:  no fungus observed. Performed At: [facility] [place], [place] [id number] Rush Farmer MD FG:[id number] Performed at [facility], [place] [address], [place],  [address]      Labs: Basic Metabolic Panel: Recent Labs  Lab 11/28/17 [phone] 11/29/17 0346 11/30/17 0500 12/01/17 0513 12/02/17 0611 12/03/17 0303  NA 136 139  137 135 133* 132*  K 3.1* 3.3* 4.2 4.4 3.6 3.5  CL 106 103 100* 96* 99* 99*  CO2 23 23 28 29 23 23   GLUCOSE 81 87 91 90 95 86  BUN 8 8 10 13 12 12   CREATININE 0.84 1.04 1.13 1.05 0.93 0.83  CALCIUM 7.9* 8.1* 8.3* 8.1* 8.5* 8.3*  MG 1.8 1.4* 1.7 2.5*  --  1.6*  PHOS 3.1 4.4 4.7* 4.0  --  3.3   Liver Function Tests: Recent Labs  Lab 11/29/17 0811 11/30/17 0500 12/01/17 0513 12/02/17 0611 12/03/17 0303  AST  --   --   --  161* 186*  ALT  --   --   --  61 79*  ALKPHOS  --   --   --  78 81  BILITOT  --   --   --  0.9 0.9  PROT  --   --   --  6.4* 5.6*  ALBUMIN 2.0* 2.0* 1.8* 2.0* 1.9*   No results for input(s): LIPASE, AMYLASE in the last 168 hours. No results for input(s): AMMONIA in the last 168 hours. CBC: Recent Labs  Lab 11/28/17 0415 11/29/17 0346 11/30/17 0500 12/02/17 0611  12/03/17 0303  WBC 8.3 9.4 11.9* 16.1* 11.4*  NEUTROABS  --  5.7  --  12.6* 8.4*  HGB 7.7* 9.5* 10.1* 10.6* 10.1*  HCT 23.2* 28.9* 30.8* 32.5* 31.4*  MCV 88.2 89.5 91.4 91.8 91.0  PLT 355 376 406* 458* 493*   Cardiac Enzymes: No results for input(s): CKTOTAL, CKMB, CKMBINDEX, TROPONINI in the last 168 hours. BNP: BNP (last 3 results) Recent Labs    09/20/17 2202 11/21/17 1000  BNP 243.7* 184.6*    ProBNP (last 3 results) No results for input(s): PROBNP in the last 8760 hours.  CBG: Recent Labs  Lab 11/27/17 1903 11/27/17 2343 11/28/17 0613 11/28/17 1202 11/28/17 1827  GLUCAP 138* 138* 90 82 109*       Signed:  Alma Friendly MD   [facility] 12/03/2017, 4:17 PM

## 2017-12-02 NOTE — Progress Notes (Signed)
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  Cough syncope-not thought to be this is secondary to pulmonology], dyslipidemia, HTN, HLD, former smoker-COPD Gold 2 1.5 pack/Vonbargen-severe emphysema, prior lumbar interbody fusion L4-5 10/1716, Barrett's esophagus + GERD last EGD nondysplastic Barrett's, last colonoscopy 11/11 1 hyperplastic polyp, remote stroke  Was admitted by cardiology 1/13-1/17 for possible ST MI found to have Takotsubo cardiomyopathy Return to emergency room 1/19 with severe orthostasis #CPR performed at the time Admitted 3/25 with acute respiratory failure secondary to H CAP loculated effusion-needed to be intubated on 3/25 and had left VATS with decortication 3/26 and extubated subsequently  Assessment & Plan:   Altered mental status-?  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  He is refusing skilled placement and I had a detailed discussion with his son on the phone on 4/2 and patient will not have 24-hour care post discharge.  I am discontinuing his opiate medications, Claritin, and Imodium may need social worker involvement regarding placement/guardianship if this becomes an issue and I will consult them  Acute respiratory failure with hypoxia status post extubation 9/23 Complicated empyema status post VATS 3/26 cxr 3/30 compared to 3/29 less fluid in lung pleural fluid shows viridans strep--Zosyn-->Ceftriaxone--->Cefdinir 300 bid on 3/31 and will continue to cover until 4/12 (3 weeks) White count 16.1, chest x-ray 4/ 2 no pneumothorax persistent airspace opacity left lungs--Afebrile u discontinuing  Percocet P 4/2 Ercocet, discontinuing fentanyl 4/1--use for now tylenol if pain  STEMI 2011, Takotsubo cardiomyopathy 09/2015  Chronic hypotension Orthostasis with falls Continue metoprolol 25 XL daily, continue as needed hydralazine 10-20 every 4 as needed Given IV Lasix 40 mg 3/29 IV bid --->40 IV daily to p.o. 40 mg daily 4/1 and continue at lower dose of 20 mg 12/02/17 daily -Net fluid status down from 10 to now?  -11 plavix on since 3/30 no -bleed 2/2 chronic orthostasis no aggressively control bp --continue Midodrine 2.5 3x times daily  Severe COPD mixed with emphysema still smokes 1 pack/Gunnoe Continue NicoDerm patch, Brovana 15 twice daily, Pulmicort 0.5 twice daily, Atrovent 0.5 every 6 as needed, continue Xopenex 0.63 every 6 as needed Coughs with diet-cleared for reg diet per SLP monitor  AK I Hypomagnesemia-currently 1.7---replacing with 2 g magnesium sulfate again 3/31 one more time and discontinued as 2.5 on 4/1 Hypophosphatemia-replacing with K-Phos orally twice daily-now 4.0.  Stopping replacement Given IV 4 rounds of K-change to K Dur 40 bid up to 3.6-->changing to 40 po qd 4/1 Recheck phosphorus magnesium with next lab draw   Blood loss anemia in setting of hyperplastic polyps 2011 and Barrett's esophagus Sp prbc 3/29 hemoglobin 7.7--->9.5--->10.6, labs am  Remote stroke h/o Restart on Plavix 75 daily now  Bipolar continue Cymbalta DR 60 daily, Seroquel 25 at bedtime  Hyperlipidemia continue atorvastatin 80 daily discharge fenofibrate 145   DVT prophylaxis: Lovenox code Status:   Full   family Communication:   No family + disposition Plan: Transfer to telemetry once tube out--patient does not have capacity to make medical decisions in my opinion.  He is confused thinks that the president is Tama Gander, thinks that it is nighttime when it is daytime and is  very unsteady on his feet--appreciate psychiatry input in advance and may petition for guardianship given unsafe  discharge plan at home and need for other outpatient options for discharge  Consultants:   Pulmonary  CV TS  Procedures:   Decortication and VATS 3/26  Antimicrobials:   Currently on Zosyn  Subjective:  Confused, cannot tell me where he is whereas previously was able to-the lines are down and lights are off and he thinks that this is 11:45 PM instead of a.m.  In addition cannot name the president and is confabulating although is 100% convinced that he can manage at home Nurse pulls me aside and tells me that he is unsteady on his feet and has had some hallucinations as well  Objective: Vitals:   12/02/17 0500 12/02/17 0713 12/02/17 0718 12/02/17 1115  BP:   (!) 159/89 125/88  Pulse:      Resp:   (!) 22 20  Temp:   (!) 97.4 F (36.3 C) 97.6 F (36.4 C)  TempSrc:   Oral Oral  SpO2:  93%    Weight: 57.6 kg (126 lb 15.8 oz)     Height:        Intake/Output Summary (Last 24 hours) at 12/02/2017 1207 Last data filed at 12/02/2017 0900 Gross per 24 hour  Intake 120 ml  Output 2150 ml  Net -2030 ml   Filed Weights   11/29/17 0500 12/01/17 0500 12/02/17 0500  Weight: 60 kg (132 lb 4.4 oz) 57.7 kg (127 lb 3.3 oz) 57.6 kg (126 lb 15.8 oz)    Examination:  Alert confused, no icterus no pallor abd soft nt nd no rebound no guard Chest tube dressing in place Now in chair Neuro moving 4 limbs and intact No tremor, no other findings  Data Reviewed: I have personally reviewed following labs and imaging studies  CBC: Recent Labs  Lab 11/27/17 0220 11/28/17 0415 11/29/17 0346 11/30/17 0500 12/02/17 0611  WBC 11.7* 8.3 9.4 11.9* 16.1*  NEUTROABS  --   --  5.7  --  12.6*  HGB 8.2* 7.7* 9.5* 10.1* 10.6*  HCT 24.7* 23.2* 28.9* 30.8* 32.5*  MCV 88.2 88.2 89.5 91.4 91.8  PLT 336 355 376 406* 672*   Basic Metabolic Panel: Recent Labs  Lab 11/27/17 0220 11/28/17 0415 11/29/17 0346 11/30/17 0500 12/01/17 0513 12/02/17 0611  NA 136 136 139 137 135 133*  K 4.2 3.1*  3.3* 4.2 4.4 3.6  CL 108 106 103 100* 96* 99*  CO2 21* 23 23 28 29 23   GLUCOSE 80 81 87 91 90 95  BUN 8 8 8 10 13 12   CREATININE 0.90 0.84 1.04 1.13 1.05 0.93  CALCIUM 8.0* 7.9* 8.1* 8.3* 8.1* 8.5*  MG 1.5* 1.8 1.4* 1.7 2.5*  --   PHOS 2.4* 3.1 4.4 4.7* 4.0  --    GFR: Estimated Creatinine Clearance: 56.8 mL/min (by C-G formula based on SCr of 0.93 mg/dL). Liver Function Tests: Recent Labs  Lab 11/26/17 0301 11/29/17 0811 11/30/17 0500 12/01/17 0513 12/02/17 0611  AST 35  --   --   --  161*  ALT 17  --   --   --  61  ALKPHOS 53  --   --   --  78  BILITOT 1.0  --   --   --  0.9  PROT 4.8*  --   --   --  6.4*  ALBUMIN 2.1* 2.0* 2.0* 1.8* 2.0*   No results for input(s): LIPASE, AMYLASE  in the last 168 hours. No results for input(s): AMMONIA in the last 168 hours. Coagulation Profile: No results for input(s): INR, PROTIME in the last 168 hours. Cardiac Enzymes:  Radiology Studies: Reviewed images personally in health database   Scheduled Meds: . alum & mag hydroxide-simeth  30 mL Oral Once  . arformoterol  15 mcg Nebulization BID  . atorvastatin  80 mg Oral Daily  . bisacodyl  10 mg Oral Daily  . budesonide (PULMICORT) nebulizer solution  0.5 mg Nebulization BID  . cefdinir  300 mg Oral Q12H  . Chlorhexidine Gluconate Cloth  6 each Topical Daily  . clopidogrel  75 mg Oral Daily  . DULoxetine  60 mg Oral Daily  . feeding supplement (ENSURE ENLIVE)  237 mL Oral TID BM  . furosemide  40 mg Oral Daily  . guaiFENesin  1,200 mg Oral BID  . loratadine  10 mg Oral Daily  . metoprolol succinate  25 mg Oral Daily  . mirabegron ER  50 mg Oral Daily  . multivitamin with minerals  1 tablet Oral Daily  . nicotine  21 mg Transdermal Daily  . pantoprazole  40 mg Oral Q0600  . potassium chloride  40 mEq Oral Daily  . QUEtiapine  25 mg Oral QHS  . senna-docusate  1 tablet Oral QHS  . sodium chloride flush  10-40 mL Intracatheter Q12H   Continuous Infusions:    LOS: 11 days     Time spent: [place], MD [facility] ([facility]  If 7PM-7AM, please contact night-coverage [email or web address] Password [facility] 12/02/2017, 12:07 PM

## 2017-12-03 LAB — COMPREHENSIVE METABOLIC PANEL
ALK PHOS: 81 U/L (ref 38–126)
ALT: 79 U/L — AB (ref 17–63)
AST: 186 U/L — ABNORMAL HIGH (ref 15–41)
Albumin: 1.9 g/dL — ABNORMAL LOW (ref 3.5–5.0)
Anion gap: 10 (ref 5–15)
BILIRUBIN TOTAL: 0.9 mg/dL (ref 0.3–1.2)
BUN: 12 mg/dL (ref 6–20)
CALCIUM: 8.3 mg/dL — AB (ref 8.9–10.3)
CO2: 23 mmol/L (ref 22–32)
Chloride: 99 mmol/L — ABNORMAL LOW (ref 101–111)
Creatinine, Ser: 0.83 mg/dL (ref 0.61–1.24)
GFR calc non Af Amer: >60 mL/min (ref >60)
GLUCOSE: 86 mg/dL (ref 65–99)
Potassium: 3.5 mmol/L (ref 3.5–5.1)
SODIUM: 132 mmol/L — AB (ref 135–145)
Total Protein: 5.6 g/dL — ABNORMAL LOW (ref 6.5–8.1)

## 2017-12-03 LAB — CBC WITH DIFFERENTIAL/PLATELET
BASOS PCT: 0 %
Basophils Absolute: 0 10*3/uL (ref 0.0–0.1)
Eosinophils Absolute: 0 10*3/uL (ref 0.0–0.7)
Eosinophils Relative: 0 %
HCT: 31.4 % — ABNORMAL LOW (ref 39.0–52.0)
Hemoglobin: 10.1 g/dL — ABNORMAL LOW (ref 13.0–17.0)
Lymphocytes Relative: 15 %
Lymphs Abs: 1.8 10*3/uL (ref 0.7–4.0)
MCH: 29.3 pg (ref 26.0–34.0)
MCHC: 32.2 g/dL (ref 30.0–36.0)
MCV: 91 fL (ref 78.0–100.0)
MONO ABS: 1.3 10*3/uL — AB (ref 0.1–1.0)
MONOS PCT: 11 %
NEUTROS PCT: 74 %
Neutro Abs: 8.4 10*3/uL — ABNORMAL HIGH (ref 1.7–7.7)
Platelets: 493 10*3/uL — ABNORMAL HIGH (ref 150–400)
RBC: 3.45 MIL/uL — ABNORMAL LOW (ref 4.22–5.81)
RDW: 16 % — AB (ref 11.5–15.5)
WBC: 11.4 10*3/uL — ABNORMAL HIGH (ref 4.0–10.5)

## 2017-12-03 LAB — MAGNESIUM: Magnesium: 1.6 mg/dL — ABNORMAL LOW (ref 1.7–2.4)

## 2017-12-03 LAB — PHOSPHORUS: Phosphorus: 3.3 mg/dL (ref 2.5–4.6)

## 2017-12-03 MED ORDER — FUROSEMIDE 40 MG PO TABS: 20.0000 mg | ORAL_TABLET | Freq: Every day | ORAL | 12 refills | Status: DC

## 2017-12-03 MED ORDER — SENNOSIDES-DOCUSATE SODIUM 8.6-50 MG PO TABS: 1.0000 | ORAL_TABLET | Freq: Every evening | ORAL | Status: DC | PRN

## 2017-12-03 NOTE — Care Management Note (Signed)
Case Management Note  Patient Details  Name: Brad Turner MRN: [id number] Date of Birth: 1940-09-15  Subjective/Objective:    Pt admitted with PNA             Action/Plan:  PTA from home alone.  Pt was seen by Pysch - cleared to refuse SNF.  Pt states he son will transport him home via private vehicle.  CM offered choice for [facility] ordered- pt chose [facility] contacted and referral accepted    Expected Discharge Date:  12/03/17               Expected Discharge Plan:  [place]  In-House Referral:     Discharge planning Services  CM Consult  Post Acute Care Choice:    Choice offered to:  Patient  DME Arranged:    DME Agency:     HH Arranged:  RN, PT, Nurse's Aide, Refused SNF, Social Work [employer] Agency:  [place]  Status of Service:  In process, will continue to follow  If discussed at Long Length of Stay Meetings, dates discussed:    Additional Comments:  Maryclare Labrador, RN 12/03/2017, 9:53 AM

## 2017-12-03 NOTE — Care Management Important Message (Signed)
Important Message  Patient Details  Name: Brad Turner MRN: [id number] Date of Birth: 21-Jul-1941   Medicare Important Message Given:  Yes    Ladaisha Portillo P McDougal 12/03/2017, 1:53 PM

## 2017-12-07 ENCOUNTER — Emergency Department (HOSPITAL_COMMUNITY): Payer: Medicare Other

## 2017-12-07 ENCOUNTER — Emergency Department (HOSPITAL_COMMUNITY)
Admission: EM | Admit: 2017-12-07 | Discharge: 2017-12-08 | Disposition: A | Discharge status: Home / Self Care | Payer: Medicare Other | Attending: Emergency Medicine | Admitting: Emergency Medicine

## 2017-12-07 ENCOUNTER — Encounter (HOSPITAL_COMMUNITY): Payer: Self-pay | Admitting: Emergency Medicine

## 2017-12-07 ENCOUNTER — Other Ambulatory Visit: Payer: Self-pay

## 2017-12-07 DIAGNOSIS — I2510 Atherosclerotic heart disease of native coronary artery without angina pectoris: Secondary | ICD-10-CM | POA: Diagnosis not present

## 2017-12-07 DIAGNOSIS — R638 Other symptoms and signs concerning food and fluid intake: Secondary | ICD-10-CM | POA: Diagnosis not present

## 2017-12-07 DIAGNOSIS — Z8673 Personal history of transient ischemic attack (TIA), and cerebral infarction without residual deficits: Secondary | ICD-10-CM | POA: Diagnosis not present

## 2017-12-07 DIAGNOSIS — K5900 Constipation, unspecified: Secondary | ICD-10-CM | POA: Diagnosis not present

## 2017-12-07 DIAGNOSIS — R5383 Other fatigue: Secondary | ICD-10-CM | POA: Insufficient documentation

## 2017-12-07 DIAGNOSIS — I10 Essential (primary) hypertension: Secondary | ICD-10-CM | POA: Diagnosis not present

## 2017-12-07 DIAGNOSIS — F17210 Nicotine dependence, cigarettes, uncomplicated: Secondary | ICD-10-CM | POA: Insufficient documentation

## 2017-12-07 DIAGNOSIS — Z7902 Long term (current) use of antithrombotics/antiplatelets: Secondary | ICD-10-CM | POA: Diagnosis not present

## 2017-12-07 DIAGNOSIS — R2681 Unsteadiness on feet: Secondary | ICD-10-CM

## 2017-12-07 DIAGNOSIS — Z85828 Personal history of other malignant neoplasm of skin: Secondary | ICD-10-CM | POA: Insufficient documentation

## 2017-12-07 DIAGNOSIS — Z79899 Other long term (current) drug therapy: Secondary | ICD-10-CM | POA: Diagnosis not present

## 2017-12-07 DIAGNOSIS — M549 Dorsalgia, unspecified: Secondary | ICD-10-CM | POA: Diagnosis present

## 2017-12-07 DIAGNOSIS — J449 Chronic obstructive pulmonary disease, unspecified: Secondary | ICD-10-CM | POA: Diagnosis not present

## 2017-12-07 DIAGNOSIS — R531 Weakness: Secondary | ICD-10-CM | POA: Diagnosis not present

## 2017-12-07 LAB — CBC
HEMATOCRIT: 35 % — AB (ref 39.0–52.0)
Hemoglobin: 11.2 g/dL — ABNORMAL LOW (ref 13.0–17.0)
MCH: 29.6 pg (ref 26.0–34.0)
MCHC: 32 g/dL (ref 30.0–36.0)
MCV: 92.3 fL (ref 78.0–100.0)
PLATELETS: 774 10*3/uL — AB (ref 150–400)
RBC: 3.79 MIL/uL — ABNORMAL LOW (ref 4.22–5.81)
RDW: 16.1 % — ABNORMAL HIGH (ref 11.5–15.5)
WBC: 13.8 10*3/uL — AB (ref 4.0–10.5)

## 2017-12-07 LAB — URINALYSIS, ROUTINE W REFLEX MICROSCOPIC
BACTERIA UA: NONE SEEN
Bilirubin Urine: NEGATIVE
GLUCOSE, UA: NEGATIVE mg/dL
HGB URINE DIPSTICK: NEGATIVE
Ketones, ur: NEGATIVE mg/dL
NITRITE: NEGATIVE
Protein, ur: 30 mg/dL — AB
SPECIFIC GRAVITY, URINE: 1.024 (ref 1.005–1.030)
Squamous Epithelial / LPF: NONE SEEN
pH: 5 (ref 5.0–8.0)

## 2017-12-07 LAB — HEPATIC FUNCTION PANEL
ALBUMIN: 2.2 g/dL — AB (ref 3.5–5.0)
ALT: 131 U/L — AB (ref 17–63)
AST: 156 U/L — AB (ref 15–41)
Alkaline Phosphatase: 101 U/L (ref 38–126)
BILIRUBIN TOTAL: 1.1 mg/dL (ref 0.3–1.2)
Bilirubin, Direct: 0.3 mg/dL (ref 0.1–0.5)
Indirect Bilirubin: 0.8 mg/dL (ref 0.3–0.9)
Total Protein: 6.9 g/dL (ref 6.5–8.1)

## 2017-12-07 LAB — BASIC METABOLIC PANEL
Anion gap: 13 (ref 5–15)
BUN: 14 mg/dL (ref 6–20)
CALCIUM: 8.7 mg/dL — AB (ref 8.9–10.3)
CO2: 21 mmol/L — ABNORMAL LOW (ref 22–32)
CREATININE: 0.73 mg/dL (ref 0.61–1.24)
Chloride: 101 mmol/L (ref 101–111)
GFR calc Af Amer: >60 mL/min (ref >60)
GFR calc non Af Amer: >60 mL/min (ref >60)
GLUCOSE: 95 mg/dL (ref 65–99)
POTASSIUM: 3.3 mmol/L — AB (ref 3.5–5.1)
SODIUM: 135 mmol/L (ref 135–145)

## 2017-12-07 LAB — ETHANOL: Alcohol, Ethyl (B): <10 mg/dL (ref <10)

## 2017-12-07 LAB — I-STAT CG4 LACTIC ACID, ED: Lactic Acid, Venous: 0.88 mmol/L (ref 0.5–1.9)

## 2017-12-07 MED ORDER — THIAMINE HCL 100 MG/ML IJ SOLN
100.0000 mg | Freq: Once | INTRAMUSCULAR | Status: AC
  Administered 2017-12-07: 100 mg via INTRAVENOUS
  Filled 2017-12-07: qty 2

## 2017-12-07 MED ORDER — SODIUM CHLORIDE 0.9 % IV BOLUS
1000.0000 mL | Freq: Once | INTRAVENOUS | Status: AC
  Administered 2017-12-07: 1000 mL via INTRAVENOUS

## 2017-12-07 NOTE — ED Triage Notes (Signed)
EMS stated, /pt. Stated, back surgery 2 weeks . Now my back hurts and cant do anything. Ive also had constipation for a week.

## 2017-12-07 NOTE — ED Provider Notes (Signed)
[facility] EMERGENCY DEPARTMENT Provider Note  CSN: [id number] Arrival date & time: 12/07/17 1153  Chief Complaint(s) Back Pain; Constipation; and Weakness  HPI Brad Turner is a 77 y.o. male   The history is provided by the patient.    CC: fatigue  Onset/Duration: several days Timing: constant, worsening Location: generalized Quality: fatigue Severity: moderate Modifying Factors:  Improved by: nothing  Worsened by: nothing Associated Signs/Symptoms:  Pertinent (+): decreased nutritional intake, ataxia/lower extremity weakness. Paraspinal thoracic back pain.   Pertinent (-): fever, chills, nausea, vomiting, abd pain, bowel/bladder incontinence. Context: Patient was recently discharged after being admitted for sepsis secondary to pneumonia with empyema drain with chest tube.  During patient's admission patient was evaluated by physical therapy and Occupational Therapy who recommended sniff placement however patient declined.  Psychiatric evaluation was obtained and patient was felt to be able to make clinical decisions.  He opted to be sent home with home health.  Patient reports that they have not been by to assist him at home.    Past Medical History Past Medical History:  Diagnosis Date  . Alcohol withdrawal ([place])   . Allergy    seasonal  . Arthritis   . Barrett esophagus   . BPH (benign prostatic hyperplasia)   . CAD (coronary artery disease)    a. s/p multiple POBA last in 2011 to distal OM2. b. Takotsubo event 09/2017 with cath showing no culprit, 25% prox LAD, 60% OM1, 65% OM2, EF 35-40% by cath and 30-35% by echo.  . Chronic back pain   . COPD (chronic obstructive pulmonary disease) (HCC)    Albuterol inhaler as needed.Uses Symbicort daily  . Depression   . Emphysema lung ([place])   . Fall    fell on 10/06/16 with loss of consiousness for a short time.States he remembers getting up but remembers nothing else.  . Gastric ulcer   . Gastritis   .  GERD (gastroesophageal reflux disease)    takes Omeprazole daily  . History of colon polyps    benign  . History of kidney stones   . Hyperlipidemia   . Hypertension   . Hyponatremia   . Joint pain   . Joint swelling   . 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 Neuropathy   . Nocturia   . Pneumonia    hx of  . PONV (postoperative nausea and vomiting)   . Skin cancer   . Stroke [facility]) 1995   Affected balance; and has very emotional if watching a movie  . Syncope    yr ago  . Takotsubo cardiomyopathy    a. 09/2017 - EF 35-40% by cath, 30-35% by echo.  . Urgency of urination    takes Myrbetriq daily   Patient Active Problem List   Diagnosis Date Noted  . Pressure injury of skin 12/01/2017  . Empyema of left pleural space ([place]) 11/24/2017  . Empyema, left ([place]) 11/23/2017  . Iron deficiency anemia 11/22/2017  . Acute urinary retention 11/22/2017  . Hypokalemia   . Hypomagnesemia   . Parapneumonic effusion   . Sepsis due to pneumonia ([place])   . ETOH abuse 11/21/2017  . Acute respiratory failure ([place]) 11/21/2017  . HCAP (healthcare-associated pneumonia) 11/21/2017  . Sepsis ([place]) 11/21/2017  . Pleural effusion 11/21/2017  . Acute kidney injury ([place]) 11/21/2017  . Abdominal pain 11/21/2017  . Malnutrition of moderate degree 09/24/2017  . Acute delirium 09/16/2017  .  Acute hyponatremia 09/16/2017  . Alcohol withdrawal ([place]) 09/16/2017  . 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 Tobacco abuse 09/16/2017  . Depression 09/16/2017  . History of CVA (cerebrovascular accident) 09/16/2017  . History of Barrett's esophagus 09/16/2017  . Takotsubo cardiomyopathy 09/15/2017  . Chronic back pain 12/01/2016  . S/P lumbar spinal fusion 12/01/2016  . Dysthymia 12/01/2016  . Radiculopathy 10/17/2016  . COPD mixed type ([place]) 06/10/2016  . Abnormal CT of the chest 06/10/2016  . Dysphagia   . Cigarette smoker  06/07/2015  . Essential hypertension, benign 10/30/2014    Class: Chronic  . Syncope 10/29/2014  . Cough syncope 10/07/2014  . COPD with acute exacerbation ([place]) 09/29/2014  . Pulmonary nodules 11/22/2013  . Chest pain, unspecified 10/15/2013  . Personal history of colonic polyps 06/04/2010  . COPD (chronic obstructive pulmonary disease) with emphysema ([place]) 02/26/2010  . CHEST PAIN, ATYPICAL 02/26/2010  . BARRETTS ESOPHAGUS 05/18/2009  . HYPERLIPIDEMIA 04/28/2008  . Coronary atherosclerosis 04/28/2008  . CVA 04/28/2008  . GERD 04/28/2008  . ARTHRITIS 04/28/2008  . NEPHROLITHIASIS, HX OF 04/28/2008   Home Medication(s) Prior to Admission medications   Medication Sig Start Date End Date Taking? Authorizing Provider  acetaminophen (TYLENOL 8 HOUR) 650 MG CR tablet Take 650 mg by mouth every 8 (eight) hours as needed for pain.   Yes [provider]  albuterol (PROVENTIL HFA;VENTOLIN HFA) 108 (90 BASE) MCG/ACT inhaler Inhale 2 puffs into the lungs every 6 (six) hours as needed for wheezing or shortness of breath. Reported on 11/29/2015   Yes [provider]  arformoterol (BROVANA) 15 MCG/2ML NEBU Take 2 mLs (15 mcg total) by nebulization 2 (two) times daily. 12/02/17  Yes Nita Sells, MD  atorvastatin (LIPITOR) 80 MG tablet Take 80 mg by mouth daily.    Yes [provider]  budesonide (PULMICORT) 0.5 MG/2ML nebulizer solution Take 2 mLs (0.5 mg total) by nebulization 2 (two) times daily. 12/02/17  Yes Nita Sells, MD  cefdinir (OMNICEF) 300 MG capsule Take 1 capsule (300 mg total) by mouth every 12 (twelve) hours for 14 days. 12/02/17 12/16/17 Yes Nita Sells, MD  clopidogrel (PLAVIX) 75 MG tablet Take 75 mg by mouth daily.   Yes [provider]  DULoxetine (CYMBALTA) 30 MG capsule Take 3 capsules (90 mg total) by mouth daily. 12/02/17  Yes Nita Sells, MD  feeding supplement, ENSURE ENLIVE, (ENSURE ENLIVE) LIQD Take 237 mLs by  mouth 2 (two) times daily between meals. 09/25/17  Yes Oswald Hillock, MD  fenofibrate (TRICOR) 145 MG tablet Take 145 mg by mouth daily.    Yes [provider]  folic acid (FOLVITE) 1 MG tablet Take 1 tablet (1 mg total) by mouth daily. 09/19/17  Yes Sheikh, Omair Latif, DO  furosemide (LASIX) 40 MG tablet Take 0.5 tablets (20 mg total) by mouth daily. 12/03/17  Yes Alma Friendly, MD  guaiFENesin (MUCINEX) 600 MG 12 hr tablet Take 600 mg by mouth as needed for cough or to loosen phlegm.   Yes [provider]  ipratropium (ATROVENT) 0.02 % nebulizer solution Take 2.5 mLs (0.5 mg total) by nebulization every 6 (six) hours as needed for wheezing or shortness of breath. 12/02/17  Yes Nita Sells, MD  loperamide (IMODIUM) 2 MG capsule Take 1-2 capsules (2-4 mg total) by mouth as needed for diarrhea or loose stools. 09/18/17  Yes Sheikh, Omair Latif, DO  metoprolol succinate (TOPROL-XL) 50 MG 24 hr tablet Take 1 tablet (  50 mg total) by mouth daily. Take with or immediately following a meal. 09/19/17  Yes Duke, Tami Lin, PA  midodrine (PROAMATINE) 2.5 MG tablet Take 1 tablet (2.5 mg total) by mouth 3 (three) times daily with meals. 09/25/17  Yes Lama, Marge Duncans, MD  MYRBETRIQ 50 MG TB24 tablet Take 50 mg by mouth daily.  05/03/15  Yes [provider]  NITROSTAT 0.4 MG SL tablet Place 0.4 mg under the tongue every 5 (five) minutes as needed for chest pain. Reported on 11/29/2015 05/29/13  Yes [provider]  omeprazole (PRILOSEC) 40 MG capsule TAKE ONE CAPSULE BY MOUTH TWICE DAILY 04/22/16  Yes Armbruster, Carlota Raspberry, MD  QUEtiapine (SEROQUEL) 25 MG tablet Take 1 tablet (25 mg total) by mouth at bedtime. 09/25/17  Yes Oswald Hillock, MD  SYMBICORT 160-4.5 MCG/ACT inhaler INHALE TWO PUFFS INTO LUNGS TWICE DAILY 04/19/14  Yes Clance, Armando Reichert, MD  thiamine 100 MG tablet Take 1 tablet (100 mg total) by mouth daily. 09/19/17  Yes Sheikh, Omair Latif, DO  vitamin B-12  (CYANOCOBALAMIN) 1000 MCG tablet Take 1 tablet (1,000 mcg total) by mouth daily. 09/25/17  Yes Oswald Hillock, MD                                                                                                                                    Past Surgical History Past Surgical History:  Procedure Laterality Date  . APPENDECTOMY    . CARDIAC CATHETERIZATION     x 4. Most recent one in 2011 at [facility](3 previous were done 20 yrs before)  . cataracts Bilateral   . CHEST EXPLORATION Left 11/24/2017   Procedure: CHEST EXPLORATION;  Surgeon: [name], MD;  Location: [facility];  Service: Thoracic;  Laterality: Left;  . COLONOSCOPY    . CYSTOSCOPY  07/21/2013   Dr. Jeffie Pollock  . DENTAL SURGERY     with sedation  . ESOPHAGEAL MANOMETRY N/A 09/20/2015   Procedure: ESOPHAGEAL MANOMETRY (EM);  Surgeon: Manus Gunning, MD;  Location: [facility];  Service: Gastroenterology;  Laterality: N/A;  . LEFT HEART CATH AND CORONARY ANGIOGRAPHY N/A 09/15/2017   Procedure: LEFT HEART CATH AND CORONARY ANGIOGRAPHY;  Surgeon: Martinique, Peter M, MD;  Location: [facility];  Service: Cardiovascular;  Laterality: N/A;  . LUMBAR LAMINECTOMY/DECOMPRESSION MICRODISCECTOMY N/A 01/12/2015   Procedure: LUMBAR LAMINECTOMY/DECOMPRESSION MICRODISCECTOMY 3 LEVELS;  Surgeon: Phylliss Bob, MD;  Location: [facility];  Service: Orthopedics;  Laterality: N/A;  Lumbar 3-4, lumbar 4-5, lumbar 5-sacrum 1 decompression  . parathyroid adenoma removed    . POLYPECTOMY    . ROTATOR CUFF REPAIR     bilateral  . TONSILLECTOMY    . UPPER GASTROINTESTINAL ENDOSCOPY    . VIDEO ASSISTED THORACOSCOPY (VATS)/EMPYEMA Left 11/24/2017   Procedure: VIDEO ASSISTED THORACOSCOPY (VATS)/EMPYEMA;  Surgeon: [name], MD;  Location: [facility];  Service: Thoracic;  Laterality: Left;  Marland Kitchen VIDEO  BRONCHOSCOPY N/A 11/24/2017   Procedure: VIDEO BRONCHOSCOPY;  Surgeon: [name], MD;  Location: [facility];  Service: Thoracic;  Laterality: N/A;    Family History Family History  Problem Relation Age of Onset  . Stroke Mother   . Rheum arthritis Mother   . Heart disease Father   . Colon cancer Unknown        ? mat. uncle died age 42  . Colon polyps Neg Hx   . Esophageal cancer Neg Hx   . Rectal cancer Neg Hx   . Stomach cancer Neg Hx     Social History Social History   Tobacco Use  . Smoking status: Current Every [place] Smoker    Packs/Bin: 1.50    Years: 58.00    Pack years: 87.00    Types: Cigarettes  . Smokeless tobacco: Never Used  Substance Use Topics  . Alcohol use: Yes    Alcohol/week: 8.4 oz    Types: 14 Standard drinks or equivalent per week    Comment: every [place]dy, vodka- 1-2 a Murdy   . Drug use: No   Allergies Ivp dye [iodinated diagnostic agents] and Macrodantin [nitrofurantoin]  Review of Systems Review of Systems All other systems are reviewed and are negative for acute change except as noted in the HPI  Physical Exam Vital Signs  I have reviewed the triage vital signs BP (!) 144/83   Pulse 96   Temp 97.6 F (36.4 C) (Oral)   Resp 19   SpO2 96%   Physical Exam  Constitutional: He is oriented to person, place, and time. He appears well-developed and well-nourished. No distress.  HENT:  Head: Normocephalic and atraumatic.  Nose: Nose normal.  Eyes: Pupils are equal, round, and reactive to light. Conjunctivae and EOM are normal. Right eye exhibits no discharge. Left eye exhibits no discharge. No scleral icterus.  Neck: Normal range of motion. Neck supple.  Cardiovascular: Normal rate and regular rhythm. Exam reveals no gallop and no friction rub.  No murmur heard. Pulmonary/Chest: Effort normal and breath sounds normal. No stridor. No respiratory distress. He has no rales.  Abdominal: Soft. He exhibits no distension. There is no tenderness.  Musculoskeletal: He exhibits no edema or tenderness.  Neurological: He is alert and oriented to person, place, and time.  Mental Status:  Alert and  oriented to person, place, and time.  Attention and concentration normal.  Speech clear.  Recent memory is intact  Cranial Nerves:  II Visual Fields: Intact to confrontation. Visual fields intact. III, IV, VI: Pupils equal and reactive to light and near. Full eye movement with nystagmus  V Facial Sensation: Normal. No weakness of masticatory muscles  VII: No facial weakness or asymmetry  VIII Auditory Acuity: Grossly normal  IX/X: The uvula is midline; the palate elevates symmetrically  XI: Normal sternocleidomastoid and trapezius strength  XII: The tongue is midline. No atrophy or fasciculations.   Motor System: Muscle Strength: 5/5 and symmetric in the upper and lower extremities. No pronation or drift.  Muscle Tone: Tone and muscle bulk are normal in the upper and lower extremities.   Reflexes: DTRs: 1+ and symmetrical in all four extremities. No Clonus Coordination: Intact finger-to-nose, heel-to-shin. No tremor.  Sensation: Intact to light touch, and pinprick.  Gait: unsteady gait requiring assistance.    Skin: Skin is warm and dry. No rash noted. He is not diaphoretic. No erythema.  Psychiatric: He has a normal mood and affect.  Vitals reviewed.   ED Results and  Treatments Labs (all labs ordered are listed, but only abnormal results are displayed) Labs Reviewed  BASIC METABOLIC PANEL - Abnormal; Notable for the following components:      Result Value   Potassium 3.3 (*)    CO2 21 (*)    Calcium 8.7 (*)    All other components within normal limits  CBC - Abnormal; Notable for the following components:   WBC 13.8 (*)    RBC 3.79 (*)    Hemoglobin 11.2 (*)    HCT 35.0 (*)    RDW 16.1 (*)    Platelets 774 (*)    All other components within normal limits  URINALYSIS, ROUTINE W REFLEX MICROSCOPIC - Abnormal; Notable for the following components:   Color, Urine AMBER (*)    APPearance HAZY (*)    Protein, ur 30 (*)    Leukocytes, UA TRACE (*)    All other components  within normal limits  HEPATIC FUNCTION PANEL - Abnormal; Notable for the following components:   Albumin 2.2 (*)    AST 156 (*)    ALT 131 (*)    All other components within normal limits  ETHANOL  CBG MONITORING, ED  I-STAT CG4 LACTIC ACID, ED  I-STAT CG4 LACTIC ACID, ED                                                                                                                         EKG  EKG Interpretation  Date/Time:  Sunday December 07 2017 12:08:52 EDT Ventricular Rate:  94 PR Interval:  130 QRS Duration: 82 QT Interval:  378 QTC Calculation: 472 R Axis:   76 Text Interpretation:  Normal sinus rhythm Normal ECG No significant change since last tracing Confirmed by Addison Lank [phone]) on 12/07/2017 5:23:44 PM      Radiology Ct Abdomen Pelvis Wo Contrast  Result Date: 12/07/2017 CLINICAL DATA:  77 year old male presents with back pain and constipation. Recent history of back surgery. History of cardiomyopathy and respiratory failure. Contrast allergy. EXAM: CT CHEST, ABDOMEN AND PELVIS WITHOUT CONTRAST TECHNIQUE: Multidetector CT imaging of the chest, abdomen and pelvis was performed following the standard protocol without IV contrast. COMPARISON:  CT AP 11/21/2017, CT lumbar spine 12/20/2016, CXR 12/02/2017 and priors to 11/30/2017, chest CT 11/21/2017 FINDINGS: CT CHEST FINDINGS Cardiovascular: Atherosclerosis of the great vessels without aneurysmal dilatation. Normal branch pattern off the aortic arch. Mild to moderate thoracic aortic atherosclerosis. 3.9 cm ascending thoracic aortic aneurysm diameter. Left main and three-vessel coronary arteriosclerosis. Pericardial effusion measuring up to 16 mm in thickness anteriorly. This is unchanged in appearance. Mediastinum/Nodes: Lack of IV contrast limits assessment for hilar adenopathy. Nonpathologic sized mediastinal lymph nodes. No soft tissue thickening. Surgical clips adjacent to the posterior left thyroid gland. Lungs/Pleura:  Moderate-sized left pleural effusion with mottled scattered air like lucencies noted within. Status post left-sided chest tube removal since 12/01/2017 chest radiograph. Compressive atelectasis also noted within the effusion. Adjacent pulmonary masslike opacity in the left lower  lobe measuring 2.4 x 1.4 by 1.3 cm, series 4/89. Suspect this represents a focus of atelectasis but should be followed up to exclude neoplasm. Small loculated pleural effusion along the medial aspect of the left hemithorax is noted, new since prior measuring up to 9 mm in thickness. Centrilobular and paraseptal emphysema, upper lobe predominant as before. No overt pulmonary edema scattered calcified granulomata. Musculoskeletal: No acute nor suspicious osseous abnormality. CT ABDOMEN PELVIS FINDINGS Hepatobiliary: Stable 17 x 19 mm hypodensity in the medial left hepatic lobe statistically consistent with a cyst or hemangioma but further characterization is not possible in the absence of IV contrast. Biliary sludge and layering calculi noted within the nondistended gallbladder. No ductal dilatation. Pancreas: Stable without acute abnormality. Spleen: Normal size spleen without focal mass. Adrenals/Urinary Tract: Normal bilateral adrenal glands and bilateral renal cysts. No nephrolithiasis nor obstructive uropathy. Nondistended urinary bladder which may explain the mild circumferential thickening. Stomach/Bowel: Nondistended stomach. Normal small bowel rotation without obstruction or inflammation. Status post appendectomy. Moderate stool burden within the colon without bowel obstruction or inflammation. Vascular/Lymphatic: Moderate aortoiliac and branch vessel atherosclerosis without aneurysm. No abdominopelvic adenopathy. Reproductive: Prostate and seminal vesicles are unremarkable. Other: No abdominal wall hernia or abnormality. No abdominopelvic ascites. Musculoskeletal: L4 through S1 posterior lumbar fusion and decompression. No  significant change. IMPRESSION: Chest CT: 1. Air like lucencies noted within moderate loculated left pleural effusion that is more likely a result of prior chest tube placement and interval recent removal. The possibility of an infected pleural effusion/empyema and pulmonary necrosis is not entirely excluded and therefore a follow-up may prove useful or clinical correlation with sampling of the fluid. 2. The ascending thoracic aorta measures up to 3.9 cm in caliber, borderline aneurysmal. 3. Minimal scattered granulomatous disease. 4. Centrilobular and paraseptal emphysema, upper predominant. 5. Compressive and subsegmental atelectasis at the left lung base secondary to the effusion. Abdomen and pelvic CT: 1. Stable bilateral renal cysts without obstructive uropathy. Thick-walled urinary bladder likely due to underdistention. 2. Stable 17 x 19 mm medial left hepatic lobe hypodensity. Further characterization not possible given lack of IV contrast. Statistically this may represent a cyst or hemangioma. 3. Uncomplicated cholelithiasis. 4. Moderate aortoiliac and branch vessel atherosclerosis without aneurysm. 5. L4 through S1 posterior lumbar fusion and decompression without significant change. Electronically Signed   By: Ashley Royalty M.D.   On: 12/07/2017 17:59   Ct Head Wo Contrast  Result Date: 12/08/2017 CLINICAL DATA:  Difficulty moving. Back surgery 2 weeks and now back hurts. Can't do anything. EXAM: CT HEAD WITHOUT CONTRAST TECHNIQUE: Contiguous axial images were obtained from the base of the skull through the vertex without intravenous contrast. COMPARISON:  10/29/2014 FINDINGS: Brain: Mild diffuse cerebral atrophy. Mild ventricular dilatation consistent with central atrophy. Patchy low-attenuation changes in the deep white matter consistent with small vessel ischemia. No mass-effect or midline shift. No abnormal extra-axial fluid collections. Gray-white matter junctions are distinct. Basal cisterns are not  effaced. No acute intracranial hemorrhage. Vascular: Intracranial arterial vascular calcifications are present. Skull: Calvarium appears intact. No acute displaced fractures identified. Sinuses/Orbits: Secretions demonstrated in the sphenoid sinuses. No acute air-fluid levels. Mastoid air cells are not opacified. Other: None IMPRESSION: No acute intracranial abnormalities. Chronic atrophy and small vessel ischemic changes. Electronically Signed   By: Lucienne Capers M.D.   On: 12/08/2017 00:00   Ct Chest Wo Contrast  Result Date: 12/07/2017 CLINICAL DATA:  77 year old male presents with back pain and constipation. Recent history of back surgery. History of  cardiomyopathy and respiratory failure. Contrast allergy. EXAM: CT CHEST, ABDOMEN AND PELVIS WITHOUT CONTRAST TECHNIQUE: Multidetector CT imaging of the chest, abdomen and pelvis was performed following the standard protocol without IV contrast. COMPARISON:  CT AP 11/21/2017, CT lumbar spine 12/20/2016, CXR 12/02/2017 and priors to 11/30/2017, chest CT 11/21/2017 FINDINGS: CT CHEST FINDINGS Cardiovascular: Atherosclerosis of the great vessels without aneurysmal dilatation. Normal branch pattern off the aortic arch. Mild to moderate thoracic aortic atherosclerosis. 3.9 cm ascending thoracic aortic aneurysm diameter. Left main and three-vessel coronary arteriosclerosis. Pericardial effusion measuring up to 16 mm in thickness anteriorly. This is unchanged in appearance. Mediastinum/Nodes: Lack of IV contrast limits assessment for hilar adenopathy. Nonpathologic sized mediastinal lymph nodes. No soft tissue thickening. Surgical clips adjacent to the posterior left thyroid gland. Lungs/Pleura: Moderate-sized left pleural effusion with mottled scattered air like lucencies noted within. Status post left-sided chest tube removal since 12/01/2017 chest radiograph. Compressive atelectasis also noted within the effusion. Adjacent pulmonary masslike opacity in the left  lower lobe measuring 2.4 x 1.4 by 1.3 cm, series 4/89. Suspect this represents a focus of atelectasis but should be followed up to exclude neoplasm. Small loculated pleural effusion along the medial aspect of the left hemithorax is noted, new since prior measuring up to 9 mm in thickness. Centrilobular and paraseptal emphysema, upper lobe predominant as before. No overt pulmonary edema scattered calcified granulomata. Musculoskeletal: No acute nor suspicious osseous abnormality. CT ABDOMEN PELVIS FINDINGS Hepatobiliary: Stable 17 x 19 mm hypodensity in the medial left hepatic lobe statistically consistent with a cyst or hemangioma but further characterization is not possible in the absence of IV contrast. Biliary sludge and layering calculi noted within the nondistended gallbladder. No ductal dilatation. Pancreas: Stable without acute abnormality. Spleen: Normal size spleen without focal mass. Adrenals/Urinary Tract: Normal bilateral adrenal glands and bilateral renal cysts. No nephrolithiasis nor obstructive uropathy. Nondistended urinary bladder which may explain the mild circumferential thickening. Stomach/Bowel: Nondistended stomach. Normal small bowel rotation without obstruction or inflammation. Status post appendectomy. Moderate stool burden within the colon without bowel obstruction or inflammation. Vascular/Lymphatic: Moderate aortoiliac and branch vessel atherosclerosis without aneurysm. No abdominopelvic adenopathy. Reproductive: Prostate and seminal vesicles are unremarkable. Other: No abdominal wall hernia or abnormality. No abdominopelvic ascites. Musculoskeletal: L4 through S1 posterior lumbar fusion and decompression. No significant change. IMPRESSION: Chest CT: 1. Air like lucencies noted within moderate loculated left pleural effusion that is more likely a result of prior chest tube placement and interval recent removal. The possibility of an infected pleural effusion/empyema and pulmonary necrosis  is not entirely excluded and therefore a follow-up may prove useful or clinical correlation with sampling of the fluid. 2. The ascending thoracic aorta measures up to 3.9 cm in caliber, borderline aneurysmal. 3. Minimal scattered granulomatous disease. 4. Centrilobular and paraseptal emphysema, upper predominant. 5. Compressive and subsegmental atelectasis at the left lung base secondary to the effusion. Abdomen and pelvic CT: 1. Stable bilateral renal cysts without obstructive uropathy. Thick-walled urinary bladder likely due to underdistention. 2. Stable 17 x 19 mm medial left hepatic lobe hypodensity. Further characterization not possible given lack of IV contrast. Statistically this may represent a cyst or hemangioma. 3. Uncomplicated cholelithiasis. 4. Moderate aortoiliac and branch vessel atherosclerosis without aneurysm. 5. L4 through S1 posterior lumbar fusion and decompression without significant change. Electronically Signed   By: Ashley Royalty M.D.   On: 12/07/2017 17:59   Pertinent labs & imaging results that were available during my care of the patient were reviewed by me  and considered in my medical decision making (see chart for details).  Medications Ordered in ED Medications  sodium chloride 0.9 % bolus 1,000 mL (0 mLs Intravenous Stopped 12/07/17 1915)  sodium chloride 0.9 % bolus 1,000 mL (1,000 mLs Intravenous New Bag/Given 12/07/17 2124)  thiamine (B-1) injection 100 mg (100 mg Intravenous Given 12/07/17 2340)                                                                                                                                    Procedures Procedures  (including critical care time)  Medical Decision Making / ED Course I have reviewed the nursing notes for this encounter and the patient's prior records (if available in EHR or on provided paperwork).    Workup revealed evidence of hemoconcentration on CBC.  Otherwise labs are grossly reassuring.  CT scans were obtained which  did not reveal evidence of acute process.  We did see evidence of recent chest tube placement CT abdomen benign.  CT head negative.. Patient was provided with IV fluids, food and oral hydration.  Patient remains unsteady on his feet, which appears to be the same as the evaluation performed during his inpatient physical therapy occupational therapy evaluation.  Given the lack of home health established, patient will be boarded in the emergency department and case management/social worker consulted for assistance with home health in the morning.  Patient care turned over to Dr Randal Buba at [facility]. Patient case and results discussed in detail; please see their note for further ED managment.      Final Clinical Impression(s) / ED Diagnoses Final diagnoses:  Other fatigue  Unstable gait      This chart was dictated using voice recognition software.  Despite best efforts to proofread,  errors can occur which can change the documentation meaning.   Fatima Blank, MD 12/08/17 [phone]

## 2017-12-08 NOTE — Discharge Planning (Signed)
EDCM reached out to pt son, [name] to discuss disposition for needs for his father.  [name] thinks that pt should go to SNF as pt is unable to walk.  [name] states he works 12 hours in [place] and is not available to help father off the floor.  Pt in agreement and states he will give SNF a try and work hard to return home within a week.

## 2017-12-08 NOTE — ED Notes (Signed)
Patient stated he's unable to stand and walk.

## 2017-12-08 NOTE — ED Notes (Signed)
PTAR called  

## 2017-12-08 NOTE — ED Provider Notes (Signed)
77 yo M who I received in sign out, briefly the patient has had some decompensation at home is been having difficulty getting out of bed but has refused skilled nursing facility placement and prefers to stay at home.  The plan currently is to await for case management to ensure that his home health has been set up prior to discharge.  After reevaluation by case management and social work the patient now elects to go into a skilled nursing facility.  Will go to [facility].     Deno Etienne, DO 12/08/17 1016

## 2017-12-08 NOTE — Progress Notes (Signed)
CSW spoke with pt at bedside. CSW spoke with pt about placement options as pt had spoken with someone about this in the past and pt states "I dont think that this is a good options for me". Pt went on to explain to CSW that pt was told by someone (unable to provide sources of this) that pt could stay in the hospital for a week and do therapy and then go home and come back to the hospital for therapy again. CSW explained that unfortunately this isnt how this works but pt was to go to a SNF then often times after discharged from the SNF they set up [facility] services for pt. Pt still declined as pt expressed that pt is being admitted.  CSW informed pt that per notes it doesn't look that pt will be being admitted therefore CSW spoke with pt about [facility] services as chart note reflects that [facility] services was set up on 12/03/17 with [facility]. CSW informed pt that CSW would speak with RNCM to ensure that these services are in place for pt before leaving. CSW to follow up with RNCM to ensure that pt is set up with [facility] services at this time.     Brad Turner, MSW, [facility] Clinical Social Worker [phone]

## 2017-12-08 NOTE — NC FL2 (Signed)
[place] LEVEL OF CARE SCREENING TOOL     IDENTIFICATION  Patient Name: Brad Turner Birthdate: 05/05/1941 Sex: male Admission Date (Current Location): 12/07/2017  [facility] and [place] Number:  Herbalist and Address:  [facility] [place]. [facility], [place] [address], [place], [place] [address]      Provider Number: [id number]  Attending Physician Name and Address:  Default, Provider, MD  Relative Name and Phone Number:       Current Level of Care: Hospital Recommended Level of Care: [place] Prior Approval Number:    Date Approved/Denied:   PASRR Number:   [id number]   Discharge Plan: SNF    Current Diagnoses: Patient Active Problem List   Diagnosis Date Noted  . Pressure injury of skin 12/01/2017  . Empyema of left pleural space ([place]) 11/24/2017  . Empyema, left ([place]) 11/23/2017  . Iron deficiency anemia 11/22/2017  . Acute urinary retention 11/22/2017  . Hypokalemia   . Hypomagnesemia   . Parapneumonic effusion   . Sepsis due to pneumonia ([place])   . ETOH abuse 11/21/2017  . Acute respiratory failure ([place]) 11/21/2017  . HCAP (healthcare-associated pneumonia) 11/21/2017  . Sepsis ([place]) 11/21/2017  . Pleural effusion 11/21/2017  . Acute kidney injury ([place]) 11/21/2017  . Abdominal pain 11/21/2017  . Malnutrition of moderate degree 09/24/2017  . Acute delirium 09/16/2017  . Acute hyponatremia 09/16/2017  . Alcohol withdrawal ([place]) 09/16/2017  . 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 Tobacco abuse 09/16/2017  . Depression 09/16/2017  . History of CVA (cerebrovascular accident) 09/16/2017  . History of Barrett's esophagus 09/16/2017  . Takotsubo cardiomyopathy 09/15/2017  . Chronic back pain 12/01/2016  . S/P lumbar spinal fusion 12/01/2016  . Dysthymia 12/01/2016  . Radiculopathy 10/17/2016  . COPD mixed type ([place]) 06/10/2016  . Abnormal CT of the chest 06/10/2016  . Dysphagia    . Cigarette smoker 06/07/2015  . Essential hypertension, benign 10/30/2014    Class: Chronic  . Syncope 10/29/2014  . Cough syncope 10/07/2014  . COPD with acute exacerbation ([place]) 09/29/2014  . Pulmonary nodules 11/22/2013  . Chest pain, unspecified 10/15/2013  . Personal history of colonic polyps 06/04/2010  . COPD (chronic obstructive pulmonary disease) with emphysema ([place]) 02/26/2010  . CHEST PAIN, ATYPICAL 02/26/2010  . BARRETTS ESOPHAGUS 05/18/2009  . HYPERLIPIDEMIA 04/28/2008  . Coronary atherosclerosis 04/28/2008  . CVA 04/28/2008  . GERD 04/28/2008  . ARTHRITIS 04/28/2008  . NEPHROLITHIASIS, HX OF 04/28/2008    Orientation RESPIRATION BLADDER Height & Weight     Self, Time, Situation, Place  Normal Continent Weight:   Height:     BEHAVIORAL SYMPTOMS/MOOD NEUROLOGICAL BOWEL NUTRITION STATUS        Diet(please see discharge summary. )  AMBULATORY STATUS COMMUNICATION OF NEEDS Skin   Extensive Assist Verbally Normal                       Personal Care Assistance Level of Assistance  Bathing, Feeding, Dressing Bathing Assistance: Maximum assistance Feeding assistance: Limited assistance Dressing Assistance: Maximum assistance     Functional Limitations Info  Sight, Speech, Hearing Sight Info: Adequate Hearing Info: Adequate Speech Info: Adequate    SPECIAL CARE FACTORS FREQUENCY  PT (By licensed PT), OT (By licensed OT)     PT Frequency: 5 times a week  OT Frequency: 5 times a week             Contractures  Contractures Info: Not present    Additional Factors Info  Code Status, Allergies Code Status Info: Prior  Allergies Info: Ivp Dye Iodinated Diagnostic Agents, Macrodantin Nitrofurantoin           Current Medications (12/08/2017):  This is the current hospital active medication list No current facility-administered medications for this encounter.    Current Outpatient Medications  Medication Sig Dispense Refill  . acetaminophen  (TYLENOL 8 HOUR) 650 MG CR tablet Take 650 mg by mouth every 8 (eight) hours as needed for pain.    Marland Kitchen albuterol (PROVENTIL HFA;VENTOLIN HFA) 108 (90 BASE) MCG/ACT inhaler Inhale 2 puffs into the lungs every 6 (six) hours as needed for wheezing or shortness of breath. Reported on 11/29/2015    . arformoterol (BROVANA) 15 MCG/2ML NEBU Take 2 mLs (15 mcg total) by nebulization 2 (two) times daily. 120 mL 0  . atorvastatin (LIPITOR) 80 MG tablet Take 80 mg by mouth daily.     . budesonide (PULMICORT) 0.5 MG/2ML nebulizer solution Take 2 mLs (0.5 mg total) by nebulization 2 (two) times daily. 2 mL 12  . cefdinir (OMNICEF) 300 MG capsule Take 1 capsule (300 mg total) by mouth every 12 (twelve) hours for 14 days. 28 capsule 0  . clopidogrel (PLAVIX) 75 MG tablet Take 75 mg by mouth daily.    . DULoxetine (CYMBALTA) 30 MG capsule Take 3 capsules (90 mg total) by mouth daily. 90 capsule 0  . feeding supplement, ENSURE ENLIVE, (ENSURE ENLIVE) LIQD Take 237 mLs by mouth 2 (two) times daily between meals. 237 mL 12  . fenofibrate (TRICOR) 145 MG tablet Take 145 mg by mouth daily.     . folic acid (FOLVITE) 1 MG tablet Take 1 tablet (1 mg total) by mouth daily. 30 tablet 0  . furosemide (LASIX) 40 MG tablet Take 0.5 tablets (20 mg total) by mouth daily. 30 tablet 12  . guaiFENesin (MUCINEX) 600 MG 12 hr tablet Take 600 mg by mouth as needed for cough or to loosen phlegm.    Marland Kitchen ipratropium (ATROVENT) 0.02 % nebulizer solution Take 2.5 mLs (0.5 mg total) by nebulization every 6 (six) hours as needed for wheezing or shortness of breath. 75 mL 12  . loperamide (IMODIUM) 2 MG capsule Take 1-2 capsules (2-4 mg total) by mouth as needed for diarrhea or loose stools. 20 capsule 0  . metoprolol succinate (TOPROL-XL) 50 MG 24 hr tablet Take 1 tablet (50 mg total) by mouth daily. Take with or immediately following a meal. 90 tablet 3  . midodrine (PROAMATINE) 2.5 MG tablet Take 1 tablet (2.5 mg total) by mouth 3 (three) times  daily with meals.    Marland Kitchen MYRBETRIQ 50 MG TB24 tablet Take 50 mg by mouth daily.     Marland Kitchen NITROSTAT 0.4 MG SL tablet Place 0.4 mg under the tongue every 5 (five) minutes as needed for chest pain. Reported on 11/29/2015    . omeprazole (PRILOSEC) 40 MG capsule TAKE ONE CAPSULE BY MOUTH TWICE DAILY 180 capsule 1  . QUEtiapine (SEROQUEL) 25 MG tablet Take 1 tablet (25 mg total) by mouth at bedtime. 30 tablet 0  . SYMBICORT 160-4.5 MCG/ACT inhaler INHALE TWO PUFFS INTO LUNGS TWICE DAILY 1 Inhaler 6  . thiamine 100 MG tablet Take 1 tablet (100 mg total) by mouth daily. 30 tablet 0  . vitamin B-12 (CYANOCOBALAMIN) 1000 MCG tablet Take 1 tablet (1,000 mcg total) by mouth daily. 30 tablet 2     Discharge Medications: Please  see discharge summary for a list of discharge medications.  Relevant Imaging Results:  Relevant Lab Results:   Additional Information [phone]  Wetzel Bjornstad, LCSWA

## 2017-12-08 NOTE — ED Notes (Signed)
Breakfast delivered to patient. 

## 2017-12-08 NOTE — ED Notes (Signed)
Confirmed with Jeanette Caprice, [place]. Once patient authorization is confirmed and patient is accepted to [place], [place] to contact this RN.

## 2017-12-08 NOTE — ED Notes (Signed)
Pt given urinal.

## 2017-12-08 NOTE — ED Notes (Signed)
SW at bedside at this time

## 2017-12-08 NOTE — Discharge Planning (Signed)
Clinical Social Work is seeking post-discharge placement for this patient at the following level of care: SNF.    

## 2017-12-08 NOTE — Progress Notes (Signed)
Pt still unsure on whether pt wants to return home with [facility] services which have already been arranged. CW has faxed pt out to snf at this time just in case pt decides that pt is unable to return home. CSW will continue to follow for further needs at this time.   Virgie Dad Darene Nappi, MSW, [place] [facility] Clinical Social Worker [phone]

## 2017-12-08 NOTE — ED Notes (Signed)
Regular Diet was ordered for Lunch. 

## 2017-12-08 NOTE — Progress Notes (Addendum)
CSW received call from [place] with [facility] that pt is good to come to the facility. Pt will go to [facility]. RN to call report to [phone]. CSW has faxed over all needed documents and RN to call PTAR. CSW has reached out to pt's son to give update-left VM at this time. There are no further CSW needs. CSW will sign off.     Virgie Dad. Arayla Kruschke, MSW, [place] [facility] Clinical Social Worker [phone]

## 2017-12-08 NOTE — Discharge Planning (Signed)
EDCM and EDSW met with pt at bedside regarding discharge planning.  Pt went back and forth with [facility] vs SNF.  Pt states he knows it's safer to go to SNF, but he wants freedom to come and go.  EDCM will contact pt son to assist with disposition.

## 2017-12-08 NOTE — Progress Notes (Addendum)
12:10pm-: CSW spoke with Suanne Marker from [place], they are still waiting or auth at this time.   Expected discharge to [facility] today. CSW is awaiting auth from [place] with [facility] at this time. Rhonda to follow up with CSW once Josem Kaufmann has been received.   Virgie Dad Ladesha Pacini, MSW, [place] [facility] Clinical Social Worker [phone]

## 2017-12-08 NOTE — Discharge Planning (Signed)
EDCM confirmed with Glyn Ade, RN of [facility] that pt is active for RN, PT, NA, SW.  [facility] provider visited pt at home the [place] following his discharge and pt deferred first session until "the first of the week," which would be today.

## 2017-12-09 ENCOUNTER — Encounter: Payer: Self-pay | Admitting: Internal Medicine

## 2017-12-09 ENCOUNTER — Non-Acute Institutional Stay (SKILLED_NURSING_FACILITY): Payer: Medicare Other | Admitting: Internal Medicine

## 2017-12-09 DIAGNOSIS — J432 Centrilobular emphysema: Secondary | ICD-10-CM

## 2017-12-09 DIAGNOSIS — J869 Pyothorax without fistula: Secondary | ICD-10-CM | POA: Diagnosis not present

## 2017-12-09 DIAGNOSIS — R627 Adult failure to thrive: Secondary | ICD-10-CM

## 2017-12-09 NOTE — Progress Notes (Signed)
NURSING HOME LOCATION:  [facility] ROOM NUMBER:  [id number]  CODE STATUS:  Full Code  PCP:  Haywood Pao, MD  [address] [place] [address]   This is a comprehensive admission note to [facility] performed on this date less than 30 days from date of admission. Included are preadmission medical/surgical history;reconciled medication list; family history; social history and comprehensive review of systems.  Corrections and additions to the records were documented. Comprehensive physical exam was also performed. Additionally a clinical summary was entered for each active diagnosis pertinent to this admission in the Problem List to enhance continuity of care.  HPI: The patient was seen in the ED yesterday, presenting with multiple complaints including back pain, constipation, weakness.  He had recently been hospitalized for sepsis secondary to pneumonia with empyema. Hospitalization 6/04-01/04/08 was complicated by acute respiratory failure with hypoxia requiring intubation. Dr Servando Snare performed a mini thoracotomy and decortication and VATS for the empyema 11/24/17. He has advanced COPD but had continued to smoke. Aggressive pulmonary toilet was continued while hospitalized. He exhibited post ICU delirium versus cognitive decline due to chronic alcoholism and prior strokes. At the time of discharge SNF placement was recommended but declined by the patient. Psychiatry felt the patient was competent and therefore was allowed to return home.  Red cell transfusion was necessary because of drop in hemoglobin to 7.7. He decompensated at home, to the point he had difficulty getting out of bed. He was evaluated by case manager and social worker in ED 4/8, the patient agreed to go to SNF for rehabilitation. Labs in the ED 12/07/17 revealed a potassium of 3.3, calcium 8.7, albumin 2.2, AST 156, and ALT 131. Present was normochromic, normocytic anemia with hemoglobin 11.2 and hematocrit  35. RDW was slightly increased to 16.1.  Past medical and surgical history is incredibly complicated and includes history of hyperplastic polyps, Barrett's esophagus, bipolar disorder, dyslipidemia, prior STEMI 2011, Takosubo syndrome 09/2015, chronic hypotension, orthostasis with associated falls and renal calculi.  He has had a parathyroid adenoma removed.  Social history: As noted patient has continued to smoke up to 2 packs per [place]. He reportedly drinks 1/2-1 vodkas daily.  Family history:reviewed   Review of systems: His history is variable. SLUMS was being completed at the time of exam MR results are not available yet. He stated he had a cough with some green sputum. He describes pain on the left side of the thoracoscopic procedure. His dyspnea is slightly worse. He describes some edema of lower extremities. He describes some weight loss insidiously over several months. He stated that he had cold intolerance as well as excess thirst and hunger. He describes constipation, last bowel movement was 4 days ago. He stated the last stool was loose. Postural dizziness was also mentioned as well as weakness. Speech therapy noted coughing  with intake of oral medications.  Physical exam:  Pertinent or positive findings: The patient appears somewhat chronically ill. He is Geneticist, molecular. He is not wearing the lower partial. The upper teeth are coated. Heart sounds are distant, heard best in the epigastrium. Breath sounds are decreased. He is tender to palpation over the upper abdomen. The operative site of the left chest reveals no evidence of cellulitis or abscess. Pedal pulses are decreased. He's uniformly weak. There is a uriniferous odor to the room.  General appearance: no acute distress, increased work of breathing is present.   Lymphatic: No lymphadenopathy about the head, neck, axilla. Eyes: No conjunctival inflammation  or lid edema is present. There is no scleral icterus. Ears:  External ear exam shows  no significant lesions or deformities.   Nose:  External nasal examination shows no deformity or inflammation. Nasal mucosa are pink and moist without lesions, exudates Oral exam: Lips and gums are healthy appearing.There is no oropharyngeal erythema or exudate. Neck:  No thyromegaly, masses, tenderness noted.    Heart:  Normal rate and regular rhythm. S1 and S2 normal without gallop, murmur, click, rub .  Lungs: without wheezes, rhonchi, rales, rubs. Abdomen: Bowel sounds are normal.  Abdomen is soft with no organomegaly, hernias, masses. GU: Deferred  Extremities:  No cyanosis, clubbing, edema  Neurologic exam: Balance, Rhomberg, finger to nose testing could not be completed due to clinical state Deep tendon reflexes are equal Skin: Warm & dry w/o tenting. No significant lesions or rash.  See clinical summary under each active problem in the Problem List with associated updated therapeutic plan

## 2017-12-09 NOTE — Patient Instructions (Signed)
See assessment and plan under each diagnosis in the problem list and acutely for this visit 

## 2017-12-09 NOTE — Assessment & Plan Note (Addendum)
Continue aggressive pulmonary toilet but eliminate duplication ; patient presently on Pulmicort as well as Symbicort

## 2017-12-09 NOTE — Assessment & Plan Note (Addendum)
Continue Omnicef twice a [place] until 4/16  Monitor for her decompensation

## 2017-12-09 NOTE — Assessment & Plan Note (Signed)
PT/OT @ SNF 

## 2017-12-21 ENCOUNTER — Encounter: Payer: Self-pay | Admitting: Internal Medicine

## 2017-12-21 DIAGNOSIS — R419 Unspecified symptoms and signs involving cognitive functions and awareness: Secondary | ICD-10-CM | POA: Insufficient documentation

## 2017-12-25 LAB — FUNGUS CULTURE RESULT

## 2017-12-25 LAB — FUNGUS CULTURE WITH STAIN

## 2017-12-25 LAB — FUNGAL ORGANISM REFLEX

## 2017-12-29 ENCOUNTER — Encounter: Payer: Self-pay | Admitting: Internal Medicine

## 2017-12-29 DIAGNOSIS — F4321 Adjustment disorder with depressed mood: Secondary | ICD-10-CM | POA: Insufficient documentation

## 2017-12-31 ENCOUNTER — Encounter: Payer: Self-pay | Admitting: Adult Health

## 2017-12-31 ENCOUNTER — Non-Acute Institutional Stay (SKILLED_NURSING_FACILITY): Payer: Medicare Other | Admitting: Adult Health

## 2017-12-31 DIAGNOSIS — D509 Iron deficiency anemia, unspecified: Secondary | ICD-10-CM

## 2017-12-31 DIAGNOSIS — I5181 Takotsubo syndrome: Secondary | ICD-10-CM | POA: Diagnosis not present

## 2017-12-31 DIAGNOSIS — R339 Retention of urine, unspecified: Secondary | ICD-10-CM | POA: Diagnosis not present

## 2017-12-31 DIAGNOSIS — I2510 Atherosclerotic heart disease of native coronary artery without angina pectoris: Secondary | ICD-10-CM | POA: Diagnosis not present

## 2017-12-31 DIAGNOSIS — J449 Chronic obstructive pulmonary disease, unspecified: Secondary | ICD-10-CM

## 2017-12-31 DIAGNOSIS — J869 Pyothorax without fistula: Secondary | ICD-10-CM | POA: Diagnosis not present

## 2017-12-31 DIAGNOSIS — Z8673 Personal history of transient ischemic attack (TIA), and cerebral infarction without residual deficits: Secondary | ICD-10-CM

## 2017-12-31 DIAGNOSIS — F319 Bipolar disorder, unspecified: Secondary | ICD-10-CM

## 2017-12-31 DIAGNOSIS — Z720 Tobacco use: Secondary | ICD-10-CM | POA: Diagnosis not present

## 2017-12-31 NOTE — Progress Notes (Signed)
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 resident, 2 social workers, Scientist, physiological, son and patient.  Labs/tests ordered:  CBC, BMP, Mg  Goals of care:   Short-term care   Durenda Age, NP [facility] [phone] (Monday-Friday 8:00 a.m. - 5:00 p.m.) [phone] (after hours)

## 2018-01-01 ENCOUNTER — Encounter: Payer: Self-pay | Admitting: Internal Medicine

## 2018-01-01 DIAGNOSIS — R160 Hepatomegaly, not elsewhere classified: Secondary | ICD-10-CM | POA: Insufficient documentation

## 2018-01-01 LAB — CBC AND DIFFERENTIAL
HEMATOCRIT: 32 — AB (ref 41–53)
HEMOGLOBIN: 10.8 — AB (ref 13.5–17.5)
Neutrophils Absolute: 5
PLATELETS: 416 — AB (ref 150–399)
WBC: 8.4

## 2018-01-01 LAB — VITAMIN D 25 HYDROXY (VIT D DEFICIENCY, FRACTURES): Vit D, 25-Hydroxy: 40.51

## 2018-01-01 LAB — BASIC METABOLIC PANEL
BUN: 17 (ref 4–21)
Creatinine: 0.8 (ref 0.6–1.3)
GLUCOSE: 104
Potassium: 3 — AB (ref 3.4–5.3)
Sodium: 138 (ref 137–147)

## 2018-01-01 LAB — TSH: TSH: 2 (ref 0.41–5.90)

## 2018-01-01 LAB — VITAMIN B12: Vitamin B-12: 1243

## 2018-01-02 LAB — HEPATIC FUNCTION PANEL
ALT: 29 (ref 10–40)
AST: 57 — AB (ref 14–40)
Alkaline Phosphatase: 141 — AB (ref 25–125)
Bilirubin, Total: 0.6

## 2018-01-02 LAB — TSH: TSH: 1.46 (ref 0.41–5.90)

## 2018-01-02 LAB — VITAMIN D 25 HYDROXY (VIT D DEFICIENCY, FRACTURES): Vit D, 25-Hydroxy: 39.34

## 2018-01-02 LAB — VITAMIN B12: VITAMIN B 12: 1083

## 2018-01-05 ENCOUNTER — Non-Acute Institutional Stay (SKILLED_NURSING_FACILITY): Payer: Medicare Other | Admitting: Adult Health

## 2018-01-05 ENCOUNTER — Encounter: Payer: Self-pay | Admitting: Adult Health

## 2018-01-05 DIAGNOSIS — Z720 Tobacco use: Secondary | ICD-10-CM

## 2018-01-05 DIAGNOSIS — F0151 Vascular dementia with behavioral disturbance: Secondary | ICD-10-CM

## 2018-01-05 DIAGNOSIS — R350 Frequency of micturition: Secondary | ICD-10-CM

## 2018-01-05 DIAGNOSIS — J449 Chronic obstructive pulmonary disease, unspecified: Secondary | ICD-10-CM

## 2018-01-05 DIAGNOSIS — F1010 Alcohol abuse, uncomplicated: Secondary | ICD-10-CM

## 2018-01-05 DIAGNOSIS — F01518 Vascular dementia, unspecified severity, with other behavioral disturbance: Secondary | ICD-10-CM

## 2018-01-05 DIAGNOSIS — Z8673 Personal history of transient ischemic attack (TIA), and cerebral infarction without residual deficits: Secondary | ICD-10-CM

## 2018-01-05 DIAGNOSIS — R627 Adult failure to thrive: Secondary | ICD-10-CM | POA: Diagnosis not present

## 2018-01-05 DIAGNOSIS — I2510 Atherosclerotic heart disease of native coronary artery without angina pectoris: Secondary | ICD-10-CM

## 2018-01-05 DIAGNOSIS — I5181 Takotsubo syndrome: Secondary | ICD-10-CM | POA: Diagnosis not present

## 2018-01-05 DIAGNOSIS — F319 Bipolar disorder, unspecified: Secondary | ICD-10-CM

## 2018-01-05 NOTE — Progress Notes (Signed)
Location:  [name]: 119-A Place of Service:  SNF (31) Provider:  Durenda Age, NP  Patient Care Team: [name], [name] Him, MD as PCP - General (Internal Medicine) [name], MD as PCP - Cardiology (Cardiology)  Extended Emergency Contact Information Primary Emergency Contact: [name],[name] Address: [address]          [place],  [address] [place] Phone: [phone] Mobile Phone: [phone] Relation: Son  Code Status:  Full Code  Goals of care: Advanced Directive information Advanced Directives 11/22/2017  Does Patient Have a Medical Advance Directive? Yes  Type of Advance Directive Living will;Healthcare Power of Attorney  Does patient want to make changes to medical advance directive? No - Patient declined  Copy of [place] in Chart? Yes     Chief Complaint  Patient presents with  . Medical Management of Chronic Issues    Routine [facility] visit    HPI:  Brad Turner is a 77 y.o. male seen today for medical management of chronic diseases.  He is currently a short-term care rehabilitation resident at [facility].  He has a PMH of dementia, bipolar disorder, dyslipidemia, STEMI, Barrett's esophagus, hyperplastic polyps, Takosubo syndrome, chronic hypotension, orthostasis with associated falls, and renal calculi. Patient was seen in his room. He complained of pain on his left lateral chest. Scars for thoracostomy tubes noted. No redness. He is not short of breath. He recently went home and thought he was discharged.Police found him watching television in his house.  His neighbor was saw him fall at the front porch @ 4AM and texted his son. He drove back to facility. He did not complain of pain then. No bruising on chest noted. Son and patient decided to stay at the facility and continue rehabilitation. SLUMS done on 12/11/17 and scored 20/30 -  Dementia.    Past Medical History:  Diagnosis Date  . Alcohol withdrawal ([place])   . Allergy    seasonal  . Arthritis   . Barrett esophagus   . BPH (benign prostatic hyperplasia)   . CAD (coronary artery disease)    a. s/p multiple POBA last in 2011 to distal OM2. b. Takotsubo event 09/2017 with cath showing no culprit, 25% prox LAD, 60% OM1, 65% OM2, EF 35-40% by cath and 30-35% by echo.  . Chronic back pain   . COPD (chronic obstructive pulmonary disease) (HCC)    Albuterol inhaler as needed.Uses Symbicort daily  . Depression   . Emphysema lung ([place])   . Fall    fell on 10/06/16 with loss of consiousness for a short time.States he remembers getting up but remembers nothing else.  . Gastric ulcer   . Gastritis   . GERD (gastroesophageal reflux disease)    takes Omeprazole daily  . History of colon polyps    benign  . History of kidney stones   . Hyperlipidemia   . Hypertension   . Hyponatremia   . 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 Neuropathy   . Nocturia   . Pneumonia    hx of  . PONV (postoperative nausea and vomiting)   . Skin cancer   . Stroke [facility]) 1995   Affected balance; and has very emotional if watching a movie  . Syncope    yr ago  . Takotsubo cardiomyopathy    a. 09/2017 - EF 35-40% by cath, 30-35%  by echo.  . Urgency of urination    takes Myrbetriq daily   Past Surgical History:  Procedure Laterality Date  . APPENDECTOMY    . CARDIAC CATHETERIZATION     x 4. Most recent one in 2011 at [facility](3 previous were done 20 yrs before)  . cataracts Bilateral   . CHEST EXPLORATION Left 11/24/2017   Procedure: CHEST EXPLORATION;  Surgeon: [name], MD;  Location: [facility];  Service: Thoracic;  Laterality: Left;  . COLONOSCOPY    . CYSTOSCOPY  07/21/2013   Dr. Jeffie Pollock  . DENTAL SURGERY     with sedation  . ESOPHAGEAL MANOMETRY N/A 09/20/2015   Procedure: ESOPHAGEAL MANOMETRY (EM);  Surgeon: Manus Gunning,  MD;  Location: [facility];  Service: Gastroenterology;  Laterality: N/A;  . LEFT HEART CATH AND CORONARY ANGIOGRAPHY N/A 09/15/2017   Procedure: LEFT HEART CATH AND CORONARY ANGIOGRAPHY;  Surgeon: Martinique, Peter M, MD;  Location: [place] CV LAB;  Service: Cardiovascular;  Laterality: N/A;  . LUMBAR LAMINECTOMY/DECOMPRESSION MICRODISCECTOMY N/A 01/12/2015   Procedure: LUMBAR LAMINECTOMY/DECOMPRESSION MICRODISCECTOMY 3 LEVELS;  Surgeon: Phylliss Bob, MD;  Location: [place];  Service: Orthopedics;  Laterality: N/A;  Lumbar 3-4, lumbar 4-5, lumbar 5-sacrum 1 decompression  . parathyroid adenoma removed    . POLYPECTOMY    . ROTATOR CUFF REPAIR     bilateral  . TONSILLECTOMY    . UPPER GASTROINTESTINAL ENDOSCOPY    . VIDEO ASSISTED THORACOSCOPY (VATS)/EMPYEMA Left 11/24/2017   Procedure: VIDEO ASSISTED THORACOSCOPY (VATS)/EMPYEMA;  Surgeon: [name], MD;  Location: [facility];  Service: Thoracic;  Laterality: Left;  Marland Kitchen VIDEO BRONCHOSCOPY N/A 11/24/2017   Procedure: VIDEO BRONCHOSCOPY;  Surgeon: [name], MD;  Location: [facility];  Service: Thoracic;  Laterality: N/A;    Allergies  Allergen Reactions  . Ivp Dye [Iodinated Diagnostic Agents] Hives and Shortness Of Breath    reaction was when Brad Turner was 77 years old.  Clancy Gourd [Nitrofurantoin] Other (See Comments)    Fever that lasted several months    Outpatient Encounter Medications as of 01/05/2018  Medication Sig  . acetaminophen (TYLENOL) 650 MG CR tablet Take 650 mg by mouth every 8 (eight) hours.   Marland Kitchen albuterol (PROVENTIL HFA;VENTOLIN HFA) 108 (90 BASE) MCG/ACT inhaler Inhale 2 puffs into the lungs every 6 (six) hours as needed for wheezing or shortness of breath. Reported on 11/29/2015  . atorvastatin (LIPITOR) 80 MG tablet Take 80 mg by mouth daily.   . budesonide (PULMICORT) 0.5 MG/2ML nebulizer solution Take 2 mLs (0.5 mg total) by nebulization 2 (two) times daily.  . clopidogrel (PLAVIX) 75 MG tablet Take 75 mg by mouth  daily.  . DULoxetine (CYMBALTA) 30 MG capsule Take 3 capsules (90 mg total) by mouth daily.  . fenofibrate (TRICOR) 145 MG tablet Take 145 mg by mouth daily.   . folic acid (FOLVITE) 1 MG tablet Take 1 tablet (1 mg total) by mouth daily.  Marland Kitchen guaiFENesin (MUCINEX) 600 MG 12 hr tablet Take 600 mg by mouth 2 (two) times daily as needed for cough or to loosen phlegm.   Marland Kitchen ipratropium (ATROVENT) 0.02 % nebulizer solution Take 2.5 mLs (0.5 mg total) by nebulization every 6 (six) hours as needed for wheezing or shortness of breath.  . loperamide (IMODIUM) 2 MG capsule Take 2 mg by mouth as needed for diarrhea or loose stools.  . Magnesium Cl-Calcium Carbonate (SLOW-MAG PO) Take 2 tablets by mouth daily.  . metoprolol succinate (TOPROL-XL) 50 MG 24  hr tablet Take 1 tablet (50 mg total) by mouth daily. Take with or immediately following a meal.  . midodrine (PROAMATINE) 2.5 MG tablet Take 1 tablet (2.5 mg total) by mouth 3 (three) times daily with meals.  Marland Kitchen MYRBETRIQ 50 MG TB24 tablet Take 50 mg by mouth daily.   . nicotine (NICODERM CQ - DOSED IN MG/24 HOURS) 14 mg/24hr patch Place 14 mg onto the skin daily.  Marland Kitchen NITROSTAT 0.4 MG SL tablet Place 0.4 mg under the tongue every 5 (five) minutes as needed for chest pain. Reported on 11/29/2015  . Nutritional Supplements (NUTRITIONAL SUPPLEMENT PO) Take 1 each by mouth 2 (two) times daily. Health Shakes  . omeprazole (PRILOSEC) 40 MG capsule TAKE ONE CAPSULE BY MOUTH TWICE DAILY  . potassium chloride SA (K-DUR,KLOR-CON) 20 MEQ tablet Take 20 mEq by mouth 3 (three) times daily.  . QUEtiapine (SEROQUEL) 25 MG tablet Take 1 tablet (25 mg total) by mouth at bedtime.  . SYMBICORT 160-4.5 MCG/ACT inhaler INHALE TWO PUFFS INTO LUNGS TWICE DAILY  . thiamine 100 MG tablet Take 1 tablet (100 mg total) by mouth daily.  . vitamin B-12 (CYANOCOBALAMIN) 1000 MCG tablet Take 1 tablet (1,000 mcg total) by mouth daily.  . [DISCONTINUED] loperamide (IMODIUM) 2 MG capsule Take 1-2  capsules (2-4 mg total) by mouth as needed for diarrhea or loose stools.  . [DISCONTINUED] furosemide (LASIX) 40 MG tablet Take 0.5 tablets (20 mg total) by mouth daily.   No facility-administered encounter medications on file as of 01/05/2018.     Review of Systems  GENERAL: No change in appetite, no fatigue, no weight changes, no fever, chills or weakness MOUTH and THROAT: Denies oral discomfort, gingival pain or bleeding, pain from teeth or hoarseness   RESPIRATORY: no cough, SOB, DOE, wheezing, hemoptysis CARDIAC: No chest pain, edema or palpitations GI: No abdominal pain, diarrhea, constipation, heart burn, nausea or vomiting PSYCHIATRIC: Denies feelings of depression or anxiety. No report of hallucinations, insomnia, paranoia, or agitation   Immunization History  Administered Date(s) Administered  . Influenza Split 05/14/2012  . Influenza Whole 06/02/2010, 05/09/2011  . Influenza, High Dose Seasonal PF 06/17/2016, 06/04/2017  . Influenza,inj,Quad PF,6+ Mos 06/02/2013, 05/24/2014  . Influenza-Unspecified 05/31/2015  . Pneumococcal Conjugate-13 09/02/2014  . Pneumococcal Polysaccharide-23 06/02/2009  . Tdap 10/29/2014   Pertinent  Health Maintenance Due  Topic Date Due  . INFLUENZA VACCINE  04/02/2018  . COLONOSCOPY  07/10/2020  . PNA vac Low Risk Adult  Completed      Vitals:   01/05/18 0909  BP: (!) 127/91  Pulse: 77  Resp: 16  Temp: (!) 97.4 F (36.3 C)  TempSrc: Oral  SpO2: 97%  Weight: 128 lb 6.4 oz (58.2 kg)  Height: 5\' 5"  (1.651 m)   Body mass index is 21.37 kg/m.  Physical Exam  GENERAL APPEARANCE:  In no acute distress. Normal body habitus SKIN:  Skin is warm and dry.  MOUTH and THROAT: Lips are without lesions. Oral mucosa is moist and without lesions. Tongue is normal in shape, size, and color and without lesions RESPIRATORY: Breathing is even & unlabored, BS CTAB CARDIAC: RRR, no murmur,no extra heart sounds, no edema GI: Abdomen soft, normal BS,  no masses, no tenderness EXTREMITIES:  Able to move X 4 extremities PSYCHIATRIC: Alert to self, disoriented to time and place. Affect and behavior are appropriate   Labs reviewed: Recent Labs    11/30/17 0500 12/01/17 0513 12/02/17 0611 12/03/17 0303 12/07/17 1221 01/01/18  NA 137  135 133* 132* 135 138  K 4.2 4.4 3.6 3.5 3.3* 3.0*  CL 100* 96* 99* 99* 101  --   CO2 28 29 23 23  21*  --   GLUCOSE 91 90 95 86 95  --   BUN 10 13 12 12 14 17   CREATININE 1.13 1.05 0.93 0.83 0.73 0.8  CALCIUM 8.3* 8.1* 8.5* 8.3* 8.7*  --   MG 1.7 2.5*  --  1.6*  --   --   PHOS 4.7* 4.0  --  3.3  --   --    Recent Labs    12/02/17 0611 12/03/17 0303 12/07/17 1804  AST 161* 186* 156*  ALT 61 79* 131*  ALKPHOS 78 81 101  BILITOT 0.9 0.9 1.1  PROT 6.4* 5.6* 6.9  ALBUMIN 2.0* 1.9* 2.2*   Recent Labs    12/02/17 0611 12/03/17 0303 12/07/17 1221 01/01/18  WBC 16.1* 11.4* 13.8* 8.4  NEUTROABS 12.6* 8.4*  --  5  HGB 10.6* 10.1* 11.2* 10.8*  HCT 32.5* 31.4* 35.0* 32*  MCV 91.8 91.0 92.3  --   PLT 458* 493* 774* 416*   Lab Results  Component Value Date   TSH 2.282 09/21/2017   No results found for: HGBA1C Lab Results  Component Value Date   CHOL 131 09/14/2017   HDL 84 09/14/2017   LDLCALC 38 09/14/2017   TRIG 76 11/24/2017   CHOLHDL 1.6 09/14/2017    Significant Diagnostic Results in last 30 days:  Ct Abdomen Pelvis Wo Contrast  Result Date: 12/07/2017 CLINICAL DATA:  77 year old male presents with back pain and constipation. Recent history of back surgery. History of cardiomyopathy and respiratory failure. Contrast allergy. EXAM: CT CHEST, ABDOMEN AND PELVIS WITHOUT CONTRAST TECHNIQUE: Multidetector CT imaging of the chest, abdomen and pelvis was performed following the standard protocol without IV contrast. COMPARISON:  CT AP 11/21/2017, CT lumbar spine 12/20/2016, CXR 12/02/2017 and priors to 11/30/2017, chest CT 11/21/2017 FINDINGS: CT CHEST FINDINGS Cardiovascular: Atherosclerosis  of the great vessels without aneurysmal dilatation. Normal branch pattern off the aortic arch. Mild to moderate thoracic aortic atherosclerosis. 3.9 cm ascending thoracic aortic aneurysm diameter. Left main and three-vessel coronary arteriosclerosis. Pericardial effusion measuring up to 16 mm in thickness anteriorly. This is unchanged in appearance. Mediastinum/Nodes: Lack of IV contrast limits assessment for hilar adenopathy. Nonpathologic sized mediastinal lymph nodes. No soft tissue thickening. Surgical clips adjacent to the posterior left thyroid gland. Lungs/Pleura: Moderate-sized left pleural effusion with mottled scattered air like lucencies noted within. Status post left-sided chest tube removal since 12/01/2017 chest radiograph. Compressive atelectasis also noted within the effusion. Adjacent pulmonary masslike opacity in the left lower lobe measuring 2.4 x 1.4 by 1.3 cm, series 4/89. Suspect this represents a focus of atelectasis but should be followed up to exclude neoplasm. Small loculated pleural effusion along the medial aspect of the left hemithorax is noted, new since prior measuring up to 9 mm in thickness. Centrilobular and paraseptal emphysema, upper lobe predominant as before. No overt pulmonary edema scattered calcified granulomata. Musculoskeletal: No acute nor suspicious osseous abnormality. CT ABDOMEN PELVIS FINDINGS Hepatobiliary: Stable 17 x 19 mm hypodensity in the medial left hepatic lobe statistically consistent with a cyst or hemangioma but further characterization is not possible in the absence of IV contrast. Biliary sludge and layering calculi noted within the nondistended gallbladder. No ductal dilatation. Pancreas: Stable without acute abnormality. Spleen: Normal size spleen without focal mass. Adrenals/Urinary Tract: Normal bilateral adrenal glands and bilateral renal cysts. No nephrolithiasis  nor obstructive uropathy. Nondistended urinary bladder which may explain the mild  circumferential thickening. Stomach/Bowel: Nondistended stomach. Normal small bowel rotation without obstruction or inflammation. Status post appendectomy. Moderate stool burden within the colon without bowel obstruction or inflammation. Vascular/Lymphatic: Moderate aortoiliac and branch vessel atherosclerosis without aneurysm. No abdominopelvic adenopathy. Reproductive: Prostate and seminal vesicles are unremarkable. Other: No abdominal wall hernia or abnormality. No abdominopelvic ascites. Musculoskeletal: L4 through S1 posterior lumbar fusion and decompression. No significant change. IMPRESSION: Chest CT: 1. Air like lucencies noted within moderate loculated left pleural effusion that is more likely a result of prior chest tube placement and interval recent removal. The possibility of an infected pleural effusion/empyema and pulmonary necrosis is not entirely excluded and therefore a follow-up may prove useful or clinical correlation with sampling of the fluid. 2. The ascending thoracic aorta measures up to 3.9 cm in caliber, borderline aneurysmal. 3. Minimal scattered granulomatous disease. 4. Centrilobular and paraseptal emphysema, upper predominant. 5. Compressive and subsegmental atelectasis at the left lung base secondary to the effusion. Abdomen and pelvic CT: 1. Stable bilateral renal cysts without obstructive uropathy. Thick-walled urinary bladder likely due to underdistention. 2. Stable 17 x 19 mm medial left hepatic lobe hypodensity. Further characterization not possible given lack of IV contrast. Statistically this may represent a cyst or hemangioma. 3. Uncomplicated cholelithiasis. 4. Moderate aortoiliac and branch vessel atherosclerosis without aneurysm. 5. L4 through S1 posterior lumbar fusion and decompression without significant change. Electronically Signed   By: Ashley Royalty M.D.   On: 12/07/2017 17:59   Ct Head Wo Contrast  Result Date: 12/08/2017 CLINICAL DATA:  Difficulty moving. Back  surgery 2 weeks and now back hurts. Can't do anything. EXAM: CT HEAD WITHOUT CONTRAST TECHNIQUE: Contiguous axial images were obtained from the base of the skull through the vertex without intravenous contrast. COMPARISON:  10/29/2014 FINDINGS: Brain: Mild diffuse cerebral atrophy. Mild ventricular dilatation consistent with central atrophy. Patchy low-attenuation changes in the deep white matter consistent with small vessel ischemia. No mass-effect or midline shift. No abnormal extra-axial fluid collections. Gray-white matter junctions are distinct. Basal cisterns are not effaced. No acute intracranial hemorrhage. Vascular: Intracranial arterial vascular calcifications are present. Skull: Calvarium appears intact. No acute displaced fractures identified. Sinuses/Orbits: Secretions demonstrated in the sphenoid sinuses. No acute air-fluid levels. Mastoid air cells are not opacified. Other: None IMPRESSION: No acute intracranial abnormalities. Chronic atrophy and small vessel ischemic changes. Electronically Signed   By: Lucienne Capers M.D.   On: 12/08/2017 00:00   Ct Chest Wo Contrast  Result Date: 12/07/2017 CLINICAL DATA:  77 year old male presents with back pain and constipation. Recent history of back surgery. History of cardiomyopathy and respiratory failure. Contrast allergy. EXAM: CT CHEST, ABDOMEN AND PELVIS WITHOUT CONTRAST TECHNIQUE: Multidetector CT imaging of the chest, abdomen and pelvis was performed following the standard protocol without IV contrast. COMPARISON:  CT AP 11/21/2017, CT lumbar spine 12/20/2016, CXR 12/02/2017 and priors to 11/30/2017, chest CT 11/21/2017 FINDINGS: CT CHEST FINDINGS Cardiovascular: Atherosclerosis of the great vessels without aneurysmal dilatation. Normal branch pattern off the aortic arch. Mild to moderate thoracic aortic atherosclerosis. 3.9 cm ascending thoracic aortic aneurysm diameter. Left main and three-vessel coronary arteriosclerosis. Pericardial effusion  measuring up to 16 mm in thickness anteriorly. This is unchanged in appearance. Mediastinum/Nodes: Lack of IV contrast limits assessment for hilar adenopathy. Nonpathologic sized mediastinal lymph nodes. No soft tissue thickening. Surgical clips adjacent to the posterior left thyroid gland. Lungs/Pleura: Moderate-sized left pleural effusion with mottled scattered air like lucencies  noted within. Status post left-sided chest tube removal since 12/01/2017 chest radiograph. Compressive atelectasis also noted within the effusion. Adjacent pulmonary masslike opacity in the left lower lobe measuring 2.4 x 1.4 by 1.3 cm, series 4/89. Suspect this represents a focus of atelectasis but should be followed up to exclude neoplasm. Small loculated pleural effusion along the medial aspect of the left hemithorax is noted, new since prior measuring up to 9 mm in thickness. Centrilobular and paraseptal emphysema, upper lobe predominant as before. No overt pulmonary edema scattered calcified granulomata. Musculoskeletal: No acute nor suspicious osseous abnormality. CT ABDOMEN PELVIS FINDINGS Hepatobiliary: Stable 17 x 19 mm hypodensity in the medial left hepatic lobe statistically consistent with a cyst or hemangioma but further characterization is not possible in the absence of IV contrast. Biliary sludge and layering calculi noted within the nondistended gallbladder. No ductal dilatation. Pancreas: Stable without acute abnormality. Spleen: Normal size spleen without focal mass. Adrenals/Urinary Tract: Normal bilateral adrenal glands and bilateral renal cysts. No nephrolithiasis nor obstructive uropathy. Nondistended urinary bladder which may explain the mild circumferential thickening. Stomach/Bowel: Nondistended stomach. Normal small bowel rotation without obstruction or inflammation. Status post appendectomy. Moderate stool burden within the colon without bowel obstruction or inflammation. Vascular/Lymphatic: Moderate aortoiliac  and branch vessel atherosclerosis without aneurysm. No abdominopelvic adenopathy. Reproductive: Prostate and seminal vesicles are unremarkable. Other: No abdominal wall hernia or abnormality. No abdominopelvic ascites. Musculoskeletal: L4 through S1 posterior lumbar fusion and decompression. No significant change. IMPRESSION: Chest CT: 1. Air like lucencies noted within moderate loculated left pleural effusion that is more likely a result of prior chest tube placement and interval recent removal. The possibility of an infected pleural effusion/empyema and pulmonary necrosis is not entirely excluded and therefore a follow-up may prove useful or clinical correlation with sampling of the fluid. 2. The ascending thoracic aorta measures up to 3.9 cm in caliber, borderline aneurysmal. 3. Minimal scattered granulomatous disease. 4. Centrilobular and paraseptal emphysema, upper predominant. 5. Compressive and subsegmental atelectasis at the left lung base secondary to the effusion. Abdomen and pelvic CT: 1. Stable bilateral renal cysts without obstructive uropathy. Thick-walled urinary bladder likely due to underdistention. 2. Stable 17 x 19 mm medial left hepatic lobe hypodensity. Further characterization not possible given lack of IV contrast. Statistically this may represent a cyst or hemangioma. 3. Uncomplicated cholelithiasis. 4. Moderate aortoiliac and branch vessel atherosclerosis without aneurysm. 5. L4 through S1 posterior lumbar fusion and decompression without significant change. Electronically Signed   By: Ashley Royalty M.D.   On: 12/07/2017 17:59    Assessment/Plan  1. Chronic obstructive pulmonary disease, unspecified COPD type (HCC) - no SOB/wheezing, continue Albuterol PRN Ipratropim BR 0.02% neb PRN, Budesode neb BID, Symbicort BID, will do chest x-ray due to left lateral chest pain   2. Adult failure to thrive - will continue Health shakes BID   3. ETOH abuse - continue Vitamin B1, J82 and Folic  acid   4. History of CVA (cerebrovascular accident) - stable, continue Plavix, Fenofibrate   5. Takotsubo cardiomyopathy - continue Midodrine, Metoprolol Succinate ER, Atorvastatin   6. Tobacco abuse continue Nicoderm patch  14 mg/24HR  Daily X 2 weeks   7. Bipolar affective disorder, remission status unspecified ([place]) - continue Duloxetine and Quetiapine, referred to Psych NP of [facility]   8. Vascular dementia with behavior disturbance - scored 20/30 on SLUMS, continue supportive care, fall precautions   9. CAD - no complaints of chest pains, continue NTG PRN   10. Urinary  frequency - continue Myrbetriq ER 50 mg daily    Family/ staff Communication: Discussed plan of care with resident and charge nurse.  Labs/tests ordered:  Chest x-ray  Goals of care:   Short-term care  Durenda Age, NP [facility] [phone] (Monday-Friday 8:00 a.m. - 5:00 p.m.) [phone] (after hours)

## 2018-01-06 LAB — IRON,TIBC AND FERRITIN PANEL
%SAT: 28.95
IRON: 65
TIBC: 224
UIBC: 159

## 2018-01-06 LAB — BASIC METABOLIC PANEL
BUN: 13 (ref 4–21)
CREATININE: 0.8 (ref 0.6–1.3)
GLUCOSE: 85
POTASSIUM: 3.7 (ref 3.4–5.3)
SODIUM: 140 (ref 137–147)

## 2018-01-06 LAB — ACID FAST CULTURE WITH REFLEXED SENSITIVITIES (MYCOBACTERIA): Acid Fast Culture: NEGATIVE

## 2018-01-07 ENCOUNTER — Other Ambulatory Visit: Payer: Self-pay | Admitting: Cardiothoracic Surgery

## 2018-01-07 DIAGNOSIS — J869 Pyothorax without fistula: Secondary | ICD-10-CM

## 2018-01-07 NOTE — Progress Notes (Unsigned)
cxr 

## 2018-01-08 ENCOUNTER — Ambulatory Visit: Payer: Medicare Other | Admitting: Cardiothoracic Surgery

## 2018-01-12 ENCOUNTER — Ambulatory Visit: Payer: Self-pay

## 2018-01-12 LAB — ACID FAST CULTURE WITH REFLEXED SENSITIVITIES (MYCOBACTERIA): Acid Fast Culture: NEGATIVE

## 2018-01-14 ENCOUNTER — Non-Acute Institutional Stay (SKILLED_NURSING_FACILITY): Payer: Medicare Other | Admitting: Adult Health

## 2018-01-14 ENCOUNTER — Encounter: Payer: Self-pay | Admitting: Adult Health

## 2018-01-14 DIAGNOSIS — I5181 Takotsubo syndrome: Secondary | ICD-10-CM | POA: Diagnosis not present

## 2018-01-14 DIAGNOSIS — I2510 Atherosclerotic heart disease of native coronary artery without angina pectoris: Secondary | ICD-10-CM

## 2018-01-14 DIAGNOSIS — F319 Bipolar disorder, unspecified: Secondary | ICD-10-CM | POA: Diagnosis not present

## 2018-01-14 DIAGNOSIS — J449 Chronic obstructive pulmonary disease, unspecified: Secondary | ICD-10-CM

## 2018-01-14 DIAGNOSIS — Z8673 Personal history of transient ischemic attack (TIA), and cerebral infarction without residual deficits: Secondary | ICD-10-CM

## 2018-01-14 DIAGNOSIS — F1010 Alcohol abuse, uncomplicated: Secondary | ICD-10-CM

## 2018-01-14 DIAGNOSIS — R350 Frequency of micturition: Secondary | ICD-10-CM

## 2018-01-14 DIAGNOSIS — E440 Moderate protein-calorie malnutrition: Secondary | ICD-10-CM | POA: Diagnosis not present

## 2018-01-14 LAB — ACID FAST CULTURE WITH REFLEXED SENSITIVITIES (MYCOBACTERIA): Acid Fast Culture: NEGATIVE

## 2018-01-14 NOTE — Progress Notes (Signed)
Location:  [place] [facility] Room Number: 119-A Place of Service:  SNF (31) Provider:  Durenda Age, NP  Patient Care Team: [name], [name] Him, MD as PCP - General (Internal Medicine) [name], MD as PCP - Cardiology (Cardiology)  Extended Emergency Contact Information Primary Emergency Contact: Winecoff,[name] Address: [address]          [place], [place] [address] [place] Phone: [phone] Mobile Phone: [phone] Relation: Son  Code Status:  Full Code  Goals of care: Advanced Directive information Advanced Directives 11/22/2017  Does Patient Have a Medical Advance Directive? Yes  Type of Advance Directive Living will;Healthcare Power of Attorney  Does patient want to make changes to medical advance directive? No - Patient declined  Copy of [place] in Chart? Yes     Chief Complaint  Patient presents with  . Discharge Note    Patient is discharging home 01/15/18 with home health services    HPI:  Brad Turner is a 77 y.o. male seen today for discharge.  He will have home health OT, Brad Turner, and Nursing services and APS.    He has been admitted to [facility] on 05/09/18 from a recent hospitalization for acute respiratory failure with hypoxia requiring intubation. He had thoracotomy and decortication and VATS for his emphysema on 11/24/17. He has advanced COPD and continued to smoke. He had blood transfusion due to drop in hgb to 7.7 and was discharged home. He decompensated at home and had difficulty getting out of bed. He was brought back to ED and was evaluated. Then he was discharged to SNF. He has a PMH of dementia, bipolar disorder, dyslipidemia, STEMI, Barrett's esophagus, hyperplastic polyps, Takosubo syndrome, chronic hypotension, orthostasis with associated falls, alcohol and tobacco abuse, and renal calculi.  He went home with son on 12/30/17 thinking he was discharged. Police was  called to report him missing from the facility. Police found him watching tv while sitting in his couch at home. He drove back to facility the next [place]. He continued rehabilitation at the facility. SLUMS done on 12/11/17 and scored 20/30 - dementia.  Patient was admitted to this facility for short-term rehabilitation after the patient's recent hospitalization.  Patient has completed SNF rehabilitation and therapy has cleared the patient for discharge.   Past Medical History:  Diagnosis Date  . Alcohol withdrawal ([place])   . Allergy    seasonal  . Arthritis   . Barrett esophagus   . BPH (benign prostatic hyperplasia)   . CAD (coronary artery disease)    a. s/p multiple POBA last in 2011 to distal OM2. b. Takotsubo event 09/2017 with cath showing no culprit, 25% prox LAD, 60% OM1, 65% OM2, EF 35-40% by cath and 30-35% by echo.  . Chronic back pain   . COPD (chronic obstructive pulmonary disease) (HCC)    Albuterol inhaler as needed.Uses Symbicort daily  . Depression   . Emphysema lung ([place])   . Fall    fell on 10/06/16 with loss of consiousness for a short time.States he remembers getting up but remembers nothing else.  . Gastric ulcer   . Gastritis   . GERD (gastroesophageal reflux disease)    takes Omeprazole daily  . History of colon polyps    benign  . History of kidney stones   . Hyperlipidemia   . Hypertension   . Hyponatremia   . 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 Neuropathy   . Nocturia   . Pneumonia    hx of  . PONV (postoperative nausea and vomiting)   . Skin cancer   . Stroke [facility]) 1995   Affected balance; and has very emotional if watching a movie  . Syncope    yr ago  . Takotsubo cardiomyopathy    a. 09/2017 - EF 35-40% by cath, 30-35% by echo.  . Urgency of urination    takes Myrbetriq daily   Past Surgical History:  Procedure Laterality Date  . APPENDECTOMY    . CARDIAC CATHETERIZATION     x 4. Most recent one in  2011 at [facility](3 previous were done 20 yrs before)  . cataracts Bilateral   . CHEST EXPLORATION Left 11/24/2017   Procedure: CHEST EXPLORATION;  Surgeon: [name], MD;  Location: [facility];  Service: Thoracic;  Laterality: Left;  . COLONOSCOPY    . CYSTOSCOPY  07/21/2013   Dr. Jeffie Pollock  . DENTAL SURGERY     with sedation  . ESOPHAGEAL MANOMETRY N/A 09/20/2015   Procedure: ESOPHAGEAL MANOMETRY (EM);  Surgeon: Manus Gunning, MD;  Location: [facility];  Service: Gastroenterology;  Laterality: N/A;  . LEFT HEART CATH AND CORONARY ANGIOGRAPHY N/A 09/15/2017   Procedure: LEFT HEART CATH AND CORONARY ANGIOGRAPHY;  Surgeon: Martinique, Peter M, MD;  Location: [facility] CV LAB;  Service: Cardiovascular;  Laterality: N/A;  . LUMBAR LAMINECTOMY/DECOMPRESSION MICRODISCECTOMY N/A 01/12/2015   Procedure: LUMBAR LAMINECTOMY/DECOMPRESSION MICRODISCECTOMY 3 LEVELS;  Surgeon: Phylliss Bob, MD;  Location: [facility];  Service: Orthopedics;  Laterality: N/A;  Lumbar 3-4, lumbar 4-5, lumbar 5-sacrum 1 decompression  . parathyroid adenoma removed    . POLYPECTOMY    . ROTATOR CUFF REPAIR     bilateral  . TONSILLECTOMY    . UPPER GASTROINTESTINAL ENDOSCOPY    . VIDEO ASSISTED THORACOSCOPY (VATS)/EMPYEMA Left 11/24/2017   Procedure: VIDEO ASSISTED THORACOSCOPY (VATS)/EMPYEMA;  Surgeon: [name], MD;  Location: [facility];  Service: Thoracic;  Laterality: Left;  Marland Kitchen VIDEO BRONCHOSCOPY N/A 11/24/2017   Procedure: VIDEO BRONCHOSCOPY;  Surgeon: [name], MD;  Location: [facility] OR;  Service: Thoracic;  Laterality: N/A;    Allergies  Allergen Reactions  . Ivp Dye [Iodinated Diagnostic Agents] Hives and Shortness Of Breath    reaction was when Brad Turner was 77 years old.  Clancy Gourd [Nitrofurantoin] Other (See Comments)    Fever that lasted several months    Outpatient Encounter Medications as of 01/14/2018  Medication Sig  . acetaminophen (TYLENOL) 650 MG CR tablet Take 650 mg by mouth every 8 (eight)  hours.   Marland Kitchen albuterol (PROVENTIL HFA;VENTOLIN HFA) 108 (90 BASE) MCG/ACT inhaler Inhale 2 puffs into the lungs every 6 (six) hours as needed for wheezing or shortness of breath. Reported on 11/29/2015  . atorvastatin (LIPITOR) 80 MG tablet Take 80 mg by mouth daily.   . budesonide (PULMICORT) 0.5 MG/2ML nebulizer solution Take 2 mLs (0.5 mg total) by nebulization 2 (two) times daily.  . clopidogrel (PLAVIX) 75 MG tablet Take 75 mg by mouth daily.  . DULoxetine (CYMBALTA) 30 MG capsule Take 3 capsules (90 mg total) by mouth daily.  . fenofibrate (TRICOR) 145 MG tablet Take 145 mg by mouth daily.   . folic acid (FOLVITE) 1 MG tablet Take 1 tablet (1 mg total) by mouth daily.  Marland Kitchen guaiFENesin (MUCINEX) 600 MG 12 hr tablet Take 600 mg by mouth 2 (two) times daily as needed for cough  or to loosen phlegm.   Marland Kitchen ipratropium (ATROVENT) 0.02 % nebulizer solution Take 2.5 mLs (0.5 mg total) by nebulization every 6 (six) hours as needed for wheezing or shortness of breath.  . loperamide (IMODIUM) 2 MG capsule Take 2 mg by mouth as needed for diarrhea or loose stools.  . metoprolol succinate (TOPROL-XL) 50 MG 24 hr tablet Take 1 tablet (50 mg total) by mouth daily. Take with or immediately following a meal.  . midodrine (PROAMATINE) 2.5 MG tablet Take 1 tablet (2.5 mg total) by mouth 3 (three) times daily with meals.  . Multiple Vitamins-Minerals (MULTIVITAMIN WITH MINERALS) tablet Take 1 tablet by mouth daily.  Marland Kitchen MYRBETRIQ 50 MG TB24 tablet Take 50 mg by mouth daily.   Marland Kitchen NITROSTAT 0.4 MG SL tablet Place 0.4 mg under the tongue every 5 (five) minutes as needed for chest pain. Reported on 11/29/2015  . NUTRITIONAL SUPPLEMENT LIQD Take 120 mLs by mouth. MedPass  . Nutritional Supplements (NUTRITIONAL SUPPLEMENT PO) Take 1 each by mouth 2 (two) times daily. Health Shakes  . omeprazole (PRILOSEC) 40 MG capsule TAKE ONE CAPSULE BY MOUTH TWICE DAILY  . QUEtiapine (SEROQUEL) 25 MG tablet Take 1 tablet (25 mg total) by  mouth at bedtime.  . SYMBICORT 160-4.5 MCG/ACT inhaler INHALE TWO PUFFS INTO LUNGS TWICE DAILY  . thiamine 100 MG tablet Take 1 tablet (100 mg total) by mouth daily.  . vitamin B-12 (CYANOCOBALAMIN) 1000 MCG tablet Take 1 tablet (1,000 mcg total) by mouth daily.  . [DISCONTINUED] nicotine (NICODERM CQ - DOSED IN MG/24 HOURS) 14 mg/24hr patch Place 14 mg onto the skin daily.   No facility-administered encounter medications on file as of 01/14/2018.     Review of Systems  GENERAL: No change in appetite, no fatigue, no weight changes, no fever, chills or weakness MOUTH and THROAT: Denies oral discomfort, gingival pain or bleeding, pain from teeth or hoarseness   RESPIRATORY: no cough, SOB, DOE, wheezing, hemoptysis CARDIAC: No chest pain, edema or palpitations GI: No abdominal pain, diarrhea, constipation, heart burn, nausea or vomiting GU: Denies dysuria, frequency, hematuria, incontinence, or discharge PSYCHIATRIC: Denies feelings of depression or anxiety. No report of hallucinations, insomnia, paranoia, or agitation   Immunization History  Administered Date(s) Administered  . Influenza Split 05/14/2012  . Influenza Whole 06/02/2010, 05/09/2011  . Influenza, High Dose Seasonal PF 06/17/2016, 06/04/2017  . Influenza,inj,Quad PF,6+ Mos 06/02/2013, 05/24/2014  . Influenza-Unspecified 05/31/2015  . Pneumococcal Conjugate-13 09/02/2014  . Pneumococcal Polysaccharide-23 06/02/2009  . Tdap 10/29/2014   Pertinent  Health Maintenance Due  Topic Date Due  . INFLUENZA VACCINE  04/02/2018  . COLONOSCOPY  07/10/2020  . PNA vac Low Risk Adult  Completed      Vitals:   01/14/18 0926  BP: 122/79  Pulse: 79  Resp: 20  Temp: (!) 97.1 F (36.2 C)  TempSrc: Oral  SpO2: 93%  Weight: 110 lb 3.2 oz (50 kg)  Height: 5\' 5"  (1.651 m)   Body mass index is 18.34 kg/m.  Physical Exam  GENERAL APPEARANCE:  In no acute distress.  SKIN:  Skin is warm and dry.  MOUTH and THROAT: Lips are  without lesions. Oral mucosa is moist and without lesions. Tongue is normal in shape, size, and color and without lesions RESPIRATORY: Breathing is even & unlabored, BS CTAB CARDIAC: RRR, no murmur,no extra heart sounds, no edema GI: Abdomen soft, normal BS, no masses, no tenderness EXTREMITIES: Able to move X 4 extremities  PSYCHIATRIC: Alert to self,  disoriented to time and place. Affect and behavior are appropriate   Labs reviewed: Recent Labs    11/30/17 0500 12/01/17 0513 12/02/17 0611 12/03/17 0303 12/07/17 1221 01/01/18 01/06/18  NA 137 135 133* 132* 135 138 140  K 4.2 4.4 3.6 3.5 3.3* 3.0* 3.7  CL 100* 96* 99* 99* 101  --   --   CO2 28 29 23 23  21*  --   --   GLUCOSE 91 90 95 86 95  --   --   BUN 10 13 12 12 14 17 13   CREATININE 1.13 1.05 0.93 0.83 0.73 0.8 0.8  CALCIUM 8.3* 8.1* 8.5* 8.3* 8.7*  --   --   MG 1.7 2.5*  --  1.6*  --   --   --   PHOS 4.7* 4.0  --  3.3  --   --   --    Recent Labs    12/02/17 0611 12/03/17 0303 12/07/17 1804 01/02/18  AST 161* 186* 156* 57*  ALT 61 79* 131* 29  ALKPHOS 78 81 101 141*  BILITOT 0.9 0.9 1.1  --   PROT 6.4* 5.6* 6.9  --   ALBUMIN 2.0* 1.9* 2.2*  --    Recent Labs    12/02/17 0611 12/03/17 0303 12/07/17 1221 01/01/18  WBC 16.1* 11.4* 13.8* 8.4  NEUTROABS 12.6* 8.4*  --  5  HGB 10.6* 10.1* 11.2* 10.8*  HCT 32.5* 31.4* 35.0* 32*  MCV 91.8 91.0 92.3  --   PLT 458* 493* 774* 416*   Lab Results  Component Value Date   TSH 1.46 01/02/2018    Lab Results  Component Value Date   CHOL 131 09/14/2017   HDL 84 09/14/2017   LDLCALC 38 09/14/2017   TRIG 76 11/24/2017   CHOLHDL 1.6 09/14/2017    Assessment/Plan  1. Chronic obstructive pulmonary disease, unspecified COPD type ([place]) -  continue Albuterol PRN, Ipratropium neb PRN, Mucinex PRN, Budesonide neb BID, Symbicort neb Q 12 hours   2. Takotsubo cardiomyopathy - continue Midodrine 2.5 mg 1 tab TID, Metoprolol Succinate ER 50 mg daily   3. ETOH abuse -  continue vitamin B1 100 mg daily, vitamin B12 1,000 mcg daily and Folic acid 1 mg daily   4. History of CVA (cerebrovascular accident) - continue Plavix 75 mg daily, Atorvastatin 80 mg Q HS Fenofibrate 146 mg daily   5. Urinary frequency - continue Myrbetriq ER 50 mg daily   6. Atherosclerosis of native coronary artery of native heart without angina pectoris - continue NTG PRN   7. Bipolar affective disorder, remission status unspecified ([place]) - continue Duloxetine 30 mg 3 capsules = 90 mg daily, Quetiapine 25 mg Q HS   8. Malnutrition of moderate degree - continue Multivitamins with minerals daily, health shakes BID, and Med pass 120 ml BID     I have filled out patient's discharge paperwork and written prescriptions.  Patient will receive home health Brad Turner, OT,  and Nursing and APS.  DME provided:  Rolling walker and 3-in-1 commode.  Total discharge time: Greater than 30 minutes Greater than 50% was spent in counseling and coordination of care.   Discharge time involved coordination of the discharge process with social worker, nursing staff and therapy department. Medical justification for home health services/DME verified.   Durenda Age, NP [facility] [phone] (Monday-Friday 8:00 a.m. - 5:00 p.m.) [phone] (after hours)

## 2018-01-20 ENCOUNTER — Ambulatory Visit
Admission: RE | Admit: 2018-01-20 | Discharge: 2018-01-20 | Disposition: A | Discharge status: Home / Self Care | Payer: Medicare Other | Source: Ambulatory Visit | Attending: Cardiothoracic Surgery | Admitting: Cardiothoracic Surgery

## 2018-01-20 ENCOUNTER — Other Ambulatory Visit: Payer: Self-pay | Admitting: Thoracic Surgery (Cardiothoracic Vascular Surgery)

## 2018-01-20 ENCOUNTER — Other Ambulatory Visit: Payer: Self-pay

## 2018-01-20 ENCOUNTER — Ambulatory Visit (INDEPENDENT_AMBULATORY_CARE_PROVIDER_SITE_OTHER): Payer: Self-pay | Admitting: Surgical

## 2018-01-20 VITALS — BP 122/70 | HR 99 | Resp 18 | Ht 65.0 in | Wt 110.0 lb

## 2018-01-20 DIAGNOSIS — J869 Pyothorax without fistula: Secondary | ICD-10-CM

## 2018-01-20 NOTE — Patient Instructions (Signed)
Continue increasing daily activities including walking as tolerated continue good pulmonary toilet and respiratory hygiene as well as current nebulized treatments.

## 2018-01-20 NOTE — Progress Notes (Signed)
[address]       [place] [address]             [phone]      Bari D Sahlin [place] Medical Record #[id number] Date of Birth: October 01, 1940  Referring: Haywood Pao, MD Primary Care: [name], [name] Him, MD Primary Cardiologist: Candee Furbish, MD   Chief Complaint:   POST OP FOLLOW UP  DATE OF PROCEDURE:  11/24/2017 DATE OF DISCHARGE:                              OPERATIVE REPORT   PREOPERATIVE DIAGNOSIS:  Left empyema with sepsis and respiratory failure.  POSTOPERATIVE DIAGNOSIS:  Left empyema with sepsis and respiratory failure.  SURGICAL PROCEDURE:  Bronchoscopy, left video-assisted thoracoscopy, mini thoracotomy, and decortication.  SURGEON:  Lanelle Bal, MD.  FIRST ASSISTANT:  Lars Pinks, [place].    History of Present Illness:    The patient is a 77 year old male status post the above procedure seen in the office on today's date and routine postsurgical follow-up.  Patient did spend some time in rehabilitation at a skilled nursing facility and is making slow overall continued recovery.  He denies fevers, chills or other significant constitutional symptoms.  He does have a mild nonproductive cough.  He does have some overall generalized weakness since this illness began.  Unfortunately, he does continue to smoke and he understands that this is limiting his overall recovery.      Past Medical History:  Diagnosis Date  . Alcohol withdrawal ([place])   . Allergy    seasonal  . Arthritis   . Barrett esophagus   . BPH (benign prostatic hyperplasia)   . CAD (coronary artery disease)    a. s/p multiple POBA last in 2011 to distal OM2. b. Takotsubo event 09/2017 with cath showing no culprit, 25% prox LAD, 60% OM1, 65% OM2, EF 35-40% by cath and 30-35% by echo.  . Chronic back pain   . COPD (chronic obstructive pulmonary disease) (HCC)    Albuterol inhaler as needed.Uses Symbicort daily  . Depression   . Emphysema lung  ([place])   . Fall    fell on 10/06/16 with loss of consiousness for a short time.States he remembers getting up but remembers nothing else.  . Gastric ulcer   . Gastritis   . GERD (gastroesophageal reflux disease)    takes Omeprazole daily  . History of colon polyps    benign  . History of kidney stones   . Hyperlipidemia   . Hypertension   . Hyponatremia   . 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 Neuropathy   . Nocturia   . Pneumonia    hx of  . PONV (postoperative nausea and vomiting)   . Skin cancer   . Stroke [facility]) 1995   Affected balance; and has very emotional if watching a movie  . Syncope    yr ago  . Takotsubo cardiomyopathy    a. 09/2017 - EF 35-40% by cath, 30-35% by echo.  . Urgency of urination    takes Myrbetriq daily     Social History   Tobacco Use  Smoking Status Current Every [place] Smoker  . Packs/Fornes: 1.50  . Years: 58.00  . Pack years: 87.00  . Types: Cigarettes  Smokeless Tobacco Never Used    Social History   Substance  and Sexual Activity  Alcohol Use Yes  . Alcohol/week: 8.4 oz  . Types: 14 Standard drinks or equivalent per week   Comment: every [place], vodka- 1-2 a [place]      Allergies  Allergen Reactions  . Ivp Dye [Iodinated Diagnostic Agents] Hives and Shortness Of Breath    reaction was when pt was 77 years old.  Clancy Gourd [Nitrofurantoin] Other (See Comments)    Fever that lasted several months    Current Outpatient Medications  Medication Sig Dispense Refill  . acetaminophen (TYLENOL) 650 MG CR tablet Take 650 mg by mouth every 8 (eight) hours.     Marland Kitchen albuterol (PROVENTIL HFA;VENTOLIN HFA) 108 (90 BASE) MCG/ACT inhaler Inhale 2 puffs into the lungs every 6 (six) hours as needed for wheezing or shortness of breath. Reported on 11/29/2015    . atorvastatin (LIPITOR) 80 MG tablet Take 80 mg by mouth daily.     . budesonide (PULMICORT) 0.5 MG/2ML nebulizer solution Take 2 mLs (0.5 mg total) by  nebulization 2 (two) times daily. 2 mL 12  . clopidogrel (PLAVIX) 75 MG tablet Take 75 mg by mouth daily.    . DULoxetine (CYMBALTA) 30 MG capsule Take 3 capsules (90 mg total) by mouth daily. 90 capsule 0  . fenofibrate (TRICOR) 145 MG tablet Take 145 mg by mouth daily.     . folic acid (FOLVITE) 1 MG tablet Take 1 tablet (1 mg total) by mouth daily. 30 tablet 0  . guaiFENesin (MUCINEX) 600 MG 12 hr tablet Take 600 mg by mouth 2 (two) times daily as needed for cough or to loosen phlegm.     Marland Kitchen ipratropium (ATROVENT) 0.02 % nebulizer solution Take 2.5 mLs (0.5 mg total) by nebulization every 6 (six) hours as needed for wheezing or shortness of breath. 75 mL 12  . loperamide (IMODIUM) 2 MG capsule Take 2 mg by mouth as needed for diarrhea or loose stools.    . metoprolol succinate (TOPROL-XL) 50 MG 24 hr tablet Take 1 tablet (50 mg total) by mouth daily. Take with or immediately following a meal. 90 tablet 3  . midodrine (PROAMATINE) 2.5 MG tablet Take 1 tablet (2.5 mg total) by mouth 3 (three) times daily with meals.    . Multiple Vitamins-Minerals (MULTIVITAMIN WITH MINERALS) tablet Take 1 tablet by mouth daily.    Marland Kitchen MYRBETRIQ 50 MG TB24 tablet Take 50 mg by mouth daily.     Marland Kitchen NITROSTAT 0.4 MG SL tablet Place 0.4 mg under the tongue every 5 (five) minutes as needed for chest pain. Reported on 11/29/2015    . NUTRITIONAL SUPPLEMENT LIQD Take 120 mLs by mouth. MedPass    . Nutritional Supplements (NUTRITIONAL SUPPLEMENT PO) Take 1 each by mouth 2 (two) times daily. Health Shakes    . omeprazole (PRILOSEC) 40 MG capsule TAKE ONE CAPSULE BY MOUTH TWICE DAILY 180 capsule 1  . QUEtiapine (SEROQUEL) 25 MG tablet Take 1 tablet (25 mg total) by mouth at bedtime. 30 tablet 0  . SYMBICORT 160-4.5 MCG/ACT inhaler INHALE TWO PUFFS INTO LUNGS TWICE DAILY 1 Inhaler 6  . thiamine 100 MG tablet Take 1 tablet (100 mg total) by mouth daily. 30 tablet 0  . vitamin B-12 (CYANOCOBALAMIN) 1000 MCG tablet Take 1 tablet  (1,000 mcg total) by mouth daily. 30 tablet 2   No current facility-administered medications for this visit.        Physical Exam: BP 122/70 (BP Location: Right Arm, Patient Position: Sitting, Cuff Size: Small)  Pulse 99   Resp 18   Ht 5\' 5"  (1.651 m)   Wt 49.9 kg (110 lb)   SpO2 97% Comment: RA  BMI 18.30 kg/m   General appearance: alert, cooperative and no distress Heart: regular rate and rhythm Lungs: Diminished in the right lower fields Abdomen: soft, non-tender; bowel sounds normal; no masses,  no organomegaly Extremities: No edema Wound: Incisions well-healed without evidence of infection.   Diagnostic Studies & Laboratory data:     Recent Radiology Findings:   Dg Chest 2 View  Result Date: 01/20/2018 CLINICAL DATA:  History of empyema EXAM: CHEST - 2 VIEW COMPARISON:  12/07/2017 FINDINGS: Cardiac shadows within normal limits. The right lung is clear. Persistent density is noted loculated along the posterior wall of the left hemithorax and extending inferiorly similar to that seen on prior CT examination consistent within empyema. No new focal abnormality is seen. No acute bony abnormality is noted. IMPRESSION: Changes consistent with left empyema stable from the previous exam. Electronically Signed   By: Inez Catalina M.D.   On: 01/20/2018 12:46      Recent Lab Findings: Lab Results  Component Value Date   WBC 8.4 01/01/2018   HGB 10.8 (A) 01/01/2018   HCT 32 (A) 01/01/2018   PLT 416 (A) 01/01/2018   GLUCOSE 95 12/07/2017   CHOL 131 09/14/2017   TRIG 76 11/24/2017   HDL 84 09/14/2017   LDLCALC 38 09/14/2017   ALT 29 01/02/2018   AST 57 (A) 01/02/2018   NA 140 01/06/2018   K 3.7 01/06/2018   CL 101 12/07/2017   CREATININE 0.8 01/06/2018   BUN 13 01/06/2018   CO2 21 (L) 12/07/2017   TSH 1.46 01/02/2018   INR 1.40 11/24/2017      Assessment / Plan: Patient is making slow, yet steady overall recovery.  I encouraged him to focus on lifestyle choices  including nutrition, exercise and smoking cessation.  I have made no changes in his current medication regimen.  I reviewed the chest x-ray and it appears to be consistent with previous findings so there may be a component of chronic inflammatory changes and scarring.  We will see him again in 1 month with repeat chest x-ray.         John Giovanni, PA-C 01/20/2018 1:48 PM

## 2018-02-02 ENCOUNTER — Ambulatory Visit: Payer: Medicare Other | Admitting: Pulmonary Disease

## 2018-02-09 ENCOUNTER — Encounter: Payer: Self-pay | Admitting: Pulmonary Disease

## 2018-02-09 ENCOUNTER — Ambulatory Visit: Payer: Medicare Other | Admitting: Pulmonary Disease

## 2018-02-09 VITALS — BP 154/58 | HR 77 | Ht 65.0 in | Wt 115.2 lb

## 2018-02-09 DIAGNOSIS — J918 Pleural effusion in other conditions classified elsewhere: Secondary | ICD-10-CM | POA: Diagnosis not present

## 2018-02-09 DIAGNOSIS — I2510 Atherosclerotic heart disease of native coronary artery without angina pectoris: Secondary | ICD-10-CM

## 2018-02-09 DIAGNOSIS — J189 Pneumonia, unspecified organism: Secondary | ICD-10-CM

## 2018-02-09 DIAGNOSIS — J869 Pyothorax without fistula: Secondary | ICD-10-CM | POA: Diagnosis not present

## 2018-02-09 DIAGNOSIS — M549 Dorsalgia, unspecified: Secondary | ICD-10-CM | POA: Diagnosis not present

## 2018-02-09 DIAGNOSIS — Z720 Tobacco use: Secondary | ICD-10-CM | POA: Diagnosis not present

## 2018-02-09 DIAGNOSIS — F1010 Alcohol abuse, uncomplicated: Secondary | ICD-10-CM

## 2018-02-09 DIAGNOSIS — Z981 Arthrodesis status: Secondary | ICD-10-CM

## 2018-02-09 DIAGNOSIS — R627 Adult failure to thrive: Secondary | ICD-10-CM | POA: Diagnosis not present

## 2018-02-09 DIAGNOSIS — J439 Emphysema, unspecified: Secondary | ICD-10-CM | POA: Diagnosis not present

## 2018-02-09 DIAGNOSIS — I10 Essential (primary) hypertension: Secondary | ICD-10-CM | POA: Diagnosis not present

## 2018-02-09 DIAGNOSIS — F341 Dysthymic disorder: Secondary | ICD-10-CM

## 2018-02-09 DIAGNOSIS — G8929 Other chronic pain: Secondary | ICD-10-CM

## 2018-02-09 MED ORDER — BUDESONIDE 0.5 MG/2ML IN SUSP: 0.5000 mg | Freq: Two times a day (BID) | RESPIRATORY_TRACT | 12 refills | Status: DC

## 2018-02-09 MED ORDER — ARFORMOTEROL TARTRATE 15 MCG/2ML IN NEBU: 15.0000 ug | INHALATION_SOLUTION | Freq: Two times a day (BID) | RESPIRATORY_TRACT | 6 refills | Status: AC

## 2018-02-09 NOTE — Patient Instructions (Signed)
Today we updated your med list in our EPIC system...     We will order a NEBULIZER from a DME company to provide the machine AND the BROVANA & BUDESONIDE...    Do breathing treatments w/ these 2 meds via the NEB machine twice daily...   Be sure to increase the [facility] 600mg  tabs to 2 tabs twice daily w/ extra water by mouth...   You need to follow up w/ CARDIOLOGY- DrTilley ASAP to see which meds he wants you to take at this point in time...   Adonis-- continued smoking after this illness is nothing less than a death wish on your part...    You didn't smoke at all for the 6-7 weeks you were in the [place] may find it impossible to play your trumpet if you continue to smoke!    PLEASE PLEase please quit the damn cigarettes...  Call for any questions...  Let's plan a follow up visit in 61mo, sooner if needed for problems.Marland KitchenMarland Kitchen

## 2018-02-09 NOTE — Progress Notes (Signed)
Subjective:    Patient ID: Brad Turner, male    DOB: Jan 03, 1941, 77 y.o.   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    CT Chest 09/17/13>  Clearing of prior RLL opac, 3 small nodules on the left- unchanged from 2011, severe emphysematous change, pos coronary calcif  Spirometry 11/22/13 at [facility]> FVC=3.26 (89%), FEV1=1.84 (69%), %1sec=56;  This is c/w a moderate obstructive ventilatory defect  V/Q Lung Scan 10/29/14>  Patchy nonsegmental ventilation defects, patchy scattered nonsegmental perfusion defects, felt to be a low prob scan...  CXR 10/29/14>  Heart is upper lim of norm, hyperinflated/ emphysematous lungs- NAD, DJD spine...  10/2014> he saw DrClance w/ classic description of cough syncope believed related to ACE inhibitor therapy  01/12/15> s/p lumbar laminectomy by DrDumonski   ~  June 07, 2015:  61mo ROV w/ SN>  He was the Actuary at [employer] in the past; his PCP is Training and development officer...    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     Cigarette smoker>  Still smoking 1.5ppd and he has smoked 1-1.5ppd for ~60 yrs; he declines smoking cessation help & is not motivated to quit...    Hx cough syncope> aware    CARDIAC issues> HBP, CAD, HL- followed by DrTilley (no notes in Epic)- on Plavix75, Amlod5-1/2, Diovan80, Lip80, [phone]; he remains in cardiac rehab regularly...    Dysphagia, hx esoph stricture & Barrett's esoph> on Dexilant60; eval by DrKaplan w/ hx dilatation in the past    GU issues> on Myrbetriq50, Cialis5    Ortho- LBP w/ lumbar lam 2016 by DrDumonski    Anxiety/depression> on WellbutrinXL150; his  son is treated for depression, friend died w/ metastatic lung cancer EXAM shows Afeb, VSS, O2sat=94% on RA;  HEENT- dental issues, mallampati2;  Chest- decr BS bilat w/o w/r/r;  Heart- RR w/o m/r/g;  Abd- soft, nontender, neg;  Ext- w/o c/c/e;  Neuro- intact...  Low dose screening CT Chest10/14/16>  Norm heart size, coronary & aortic calcif, 72mm noncalcif nodule in LUL, scat calcif granulomas up to 2.56mm; mod centrilobular & paraseptal emphysema, abd is ok w/ 2cm cyst in liver...  IMP/PLAN>>  Legrand Como & I discussed his continued smoking & he is not motivated to quit- offered meds and he will call if he changes his mind;  Rec to continue Symbicort160-2spBid, he was tried on Spiriva respimat in past but wouldn't fill it due to cost- I pointed out that if he'd quit smoking he could get the Spiriva & noted that his breathing would be better due to both things! Rec to add Mucinex 2400mg /d.Marland KitchenMarland KitchenWe plan ROV 15months.  ~  December 07, 2015:  69mo ROV w/ SN>  PCP- DrTisovec, Cards- DrTilley, GI- DrArmbruster...    Brad Turner has had some recent dental work w/ mult teeth pulled; he remains in cardiac rehab maintenance program and doing well; no new complaints or concerns; he is using his Symbicort160-2spBid, not using the Mucinex; he has chr stable DOE, min cough, small amt beige sput, no CP/ palpit/ edema...    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     Cigarette smoker>  Still smoking 1.5ppd and he has smoked 1-1.5ppd for ~60 yrs; he declines smoking cessation help & is not motivated to quit...    Hx cough syncope> aware    CARDIAC issues> HBP, CAD, HL- followed by DrTilley (no notes in Epic)- on Plavix75, Amlod5-1/2, Diovan80, Lip80, [phone]; he remains in cardiac rehab regularly... EXAM shows Afeb, VSS, O2sat=92% on RA;  HEENT- dental issues, mallampati2;  Chest- decr BS  bilat w/o w/r/r;  Heart- RR w/o m/r/g;  Abd- soft, nontender, neg;  Ext- w/o c/c/e;  Neuro- intact... IMP/PLAN>>  [name] is unable to quit smoking and has mod COPD w/ emphysema on his CTscans- we will add INCRUSE one inhalation daily; he knows to avoid infections & be sure he is up to date on all vaccinations from DrTisovec... we plan ROV 56mo at which time he will need his next screening CT Chest...  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 He had a recent trip to [place] & did ok w/ walking & breathing but had some back discomfort that was more limiting; he saw DrDumonski recently w/ talk of rods and screws "but I'm too high risk" and they are considering trial of nerve blocks... Of interest he noted that his 19 y/o son (adopted) had a baby daughter recently Dx w/ CF... 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    Cigarette smoker>  Still smoking 1.5ppd and he has smoked 1-1.5ppd for ~60 yrs; he declines smoking cessation help & is not motivated to quit...    Hx cough syncope> past hx syncope w/ severe coughing  paroxysms- aware    Cardiac issues> HBP, CAD, HL- followed by DrTilley (no notes in Epic, pt reports last seen 02/2016)- on Plavix75, Amlod5-1/2, Diovan80, Lip80, Tricor145; he remains in cardiac rehab regularly...    Medical issues> followed by DrTisovec- Hx dysphagia/ stricture/ Barrett's, LBP- s/p lumbar lam, anxiety/depression EXAM shows Afeb, VSS, O2sat=98% on RA;  HEENT- dental issues, mallampati2;  Chest- decr BS bilat w/o w/r/r;  Heart- RR w/o m/r/g;  Abd- soft, nontender, neg;  Ext- w/o c/c/e;  Neuro- intact...  CXR 06/10/16>  Norm heart size, atherosclerotic Ao, no adenopathy, stable 24mm opac in LUL is noted- NAD, old trauma to left clavicle...   Low-dose screening CT Chest 06/18/16>  Norm heart size w/ atherosclerotic changes in Ao, great vessels, & coronaries; No adenopathy; Mult tiny pulm nodules bilat, most are calcified & c/w granulomas; Largest non-calcif nodule is 4-73mm  in LUL & no change from 74yr ago- no new lesions; Diffuse bronchial wall thickening, & mod centrilobular & paraseptal emphysema; Small liver cyst & small bilat renal cysts noted...  Spirometry 06/10/16>  FVC=4.10 (112%), FEV1=2.00 (76%), %1sec=49%, mid-flows reduced at 32% predicted...  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  ~  October 09, 2016:  45mo ROV & add-on appt requested for syncopal episode>  Umberto describes a mild COPD exac recently and ~10d ago he was seen by PA in DrTisovec's office & given ZPak & Pred pack; he also states that he hasn't been sleeping well at night due to back pain & c/o urinary  frequency (having to get up about 10x per night which he thinks is related to Lyrica making his ankles swell & his HCT diuretic); then about 4 nights ago he got up to urinate and doesn't remember what happened next but he appears to have had a syncopal episode & awoke a short time late on the floor of his bedroom having lacerated his forehead & bled on the carpet, plus 2 black eyes; he had a remote hx of cough syncope & wondered if this might have occurred again but clearly this episode was very different than previous (ie- previously he had a severe coughing paroxysm that lead to syncope from the valsalva, and this episode did not involve coughing paroxysm in fact he noted that legs were very weak due to his spinal condition & sounds like he fell- hit his head w/ prob concussion- and awoke a short time later w/ amnesia for the actual fall); Note- he did not call 911 or go to the ER or urgent care facility; he called DrTisovec's office and was referred here for pulmonary eval due to his remote hx of cough syncope... NOTE- DrDumonski plans back surg in ~10 days! 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    Cigarette smoker>  Still smoking 1.5ppd and he has smoked 1-1.5ppd for ~60 yrs; he declines  smoking cessation help & is not motivated to quit despite knowing the risks...    Hx cough syncope> past hx syncope w/ severe coughing paroxysms while on an ACE inhibitor- aware    Cardiac issues> HBP, CAD, HL- followed by DrTilley (no notes in Epic, pt reports last seen 09/2016 &  cleared for back surg w/ neg nuclear study)- on ASA/Plavix75, Diovan160, HCT12.5, Lip80, Tricor145; he quit cardiac rehab maintenance 06/2016    Medical issues> followed by DrTisovec- Hx dysphagia/ stricture/ Barrett's (on Prilosec40), prostate and bladder problems (on Myrbetriq & Cialis), severe LBP & s/p lumbar lam (on Vicodin & Lyrica), anxiety/depression EXAM shows Afeb, VSS, O2sat=98% on RA;  HEENT- dental issues, mallampati2;  Chest- congested cough w/ scat basilar rhonchi, decr BS bilat w/o w/r/consolidation;  Heart- RR w/o m/r/g;  Abd- soft, nontender, neg;  Ext- w/o c/c/e.   CXR in [place] 08/02/16 (independently reviewed by me in the PACS system) showed norm heart size, atherosclerotic ao, clear lungs- NAD,   LABS in Epic 08/2016>  Chems showed hyponatremia w/ Na=132, & renal insuffic w/ BUN=38, Cr=2.04;  CBC is wnl w/ Hg=13.0.Marland KitchenMarland Kitchen  IMP/PLAN>>  Greydon's pulmonary situation remains the same- mixed COPD w/ emphysematous and chr bronchitic components, still smoking 1.5ppd, using his Symbicort (but I suspect not regularly), refuses addition of a LAMA med that would be additionally beneficial in the long run;  He improved after Zithromax & Pred pack per DrTisovec recently & we will place him on a sl longer course of oral PRED (see AVS), plus maximize mucolytic therapy w/ GFN400-2Qid + fluids;  His recent ?syncopal spell does not appear to be cough related- perhaps a fall from leg weakness, head trauma w/ concussion & retrograde amnesia for the fall- in any event he did not go to the ER & is in need of concussion eval/ CT vs MRI head (he is on ASA/Plavix)/ etc; pt has an ASAP appt w/ his PCP in the AM... I am concerned regarding his  surgical risk.  NOTE: >50% of this 84min visit was spent in counseling & coordination of care.  ~  November 28, 2016:  6wk ROV & post hosp check>  Andersen has a hx of prev back surgery & was [facility] 2/15 - 10/20/16 by Ortho- DrDumonski for Lumbar laminectomy & fusion (rods & screws) w/ allograft due to severe unremitting pain;  He underwent post-op rehab & improved (still using walker), then fell 11/03/16 & was seen in the ER- XRays were ok w/o acute changes, he continues to f/u w/ DrDumonski- on Percocet & muscle relaxers... He is under a lot of stress as well since his 75 y/o yellow lab "Festus Holts" is at the end of her life...    COPD,emphysema> on Symbicort160 & AlbutHFA as needed; he says his breathing is ok, notes some watery nasal drainage (try Astelin), min cough w/ min thick sput (no discoloration or blood)...     Pulm nodules> aware, see above...    Cigarette smoker> still smoking 1ppd, and can't vs won't quit... EXAM shows Afeb, VSS, O2sat=98% on RA;  He has lost 13# down to 147# today (not eating, no appetite);  HEENT- dental issues, mallampati2;  Chest- sl congested cough w/ scat basilar rhonchi, decr BS bilat w/o w/r/consolidation;  Heart- RR w/o m/r/g;  Abd- soft, nontender, neg;  Ext- w/o c/c/e.   LABS 12/02/16>  Chems- ok x BS=112;  CBC- ok w/ Hg=12.4;  TSH= 1.00 IMP/PLAN>>  Jabe is rec to add a nutritional supplements to his regimen, try Astelin NS for the runny nose, and continue the [employer];  But most importantly he must committ to smoking cessation!! Continue same meds...   ~  March 03, 2017:  74mo ROV & pulm f/u visit> Ronalee Belts reports that the prev back fusion "didn't fuse" & DrDumonski is considering further  surg but wants to wait 61mo (his note of 02/03/17 is reviewed);  He is wearing a T-brace & remains on Tizanidine & Vicodin but has stopped the Lyrica due to swelling;  He got some temporary relief from a CT-guided left SI joint injection on 02/14/17;  From the pulmonary standpoint-- Jancarlos  states that his breathing has been OK, notes intermit cough, small amt clear sput & chest congestion but denies wheezing/ chest tightness/ CP;  He is smoking 1-1.5ppd even noting that the e-cig was w/o help-- "it's the only way I got thru this"... He remains on Symbicort160-2spBid, VentolinHFA rescue as needed (using several times daily);  We discussed adding MUCINEX/ Gen GFN 400mg -2Tid w/ fluids for the congestion/ thick phlegm/ mucus plugging...    EXAM shows Afeb, VSS, O2sat=98% on RA;  He has lost more wt down to 137# today (not eating, no appetite);  HEENT- dental issues, mallampati2;  Chest- sl congested cough w/ scat basilar rhonchi, decr BS bilat w/o w/r/consolidation;  Heart- RR w/o m/r/g;  Abd- soft, nontender, neg;  Ext- w/o c/c/e.  IMP/PLAN>>  Jakye is again encouraged to cut back & quit smoking- options for medications, nicotine replacement, counseling, hypnosis, etc discussed but he is not interested;  Continue Symbicort & Ventolin, add GFN as discussed and do the best he can w/ exercise...      NOTE:  >50% of this 9min ov was spent in counseling & coordination of care...  ~  August 04, 2017:  24mo ROV & [place]'s breathing is about the same- still smoking ~2ppd w/ cough, thick clear mucus, some wheezing;  He feels that his Tomasita Crumble is more limited by his LBP than his breathing;  Today his CC is dizziness w/ postural BP changes that he thinks is related to diarrhea from [place] food- Rec to take gatorade & use imodium/ align/ activia yogurt;  BP in office today w/ supine BP= 104/70, and standing BP= 80/50...  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    Cigarette smoker>  Still smoking 1.5-2ppd and he has smoked 1-1.5ppd for ~60 yrs; he declines smoking cessation help & is not motivated to quit despite knowing the risks & his love of the trumpet...    Hx cough syncope> past hx syncope w/ severe coughing paroxysms while on an ACE inhibitor- aware    Cardiac issues> HBP, CAD, HL- followed by DrTilley (no notes in Epic), pt reports last seen 09/2016 & cleared for back surg w/ neg nuclear study- on ASA/Plavix75, Diovan160, HCT12.5, Lip80, Tricor145; he quit cardiac rehab maintenance 06/2016    Medical issues> followed by DrTisovec- Hx dysphagia/ stricture/ Barrett's (on Prilosec40), prostate and bladder problems (on Myrbetriq & Cialis), severe LBP & s/p lumbar lam (on Vicodin & Zanaflex & Cymbalta), dysthymia...  EXAM shows Afeb, postural BP changes noted, O2sat=95% on RA;  HEENT- dental issues, mallampati2;  Chest- congested cough w/ scat basilar rhonchi, decr BS bilat w/o w/r/consolidation;  Heart- RR  w/o m/r/g;  Abd- soft, nontender, neg;  Ext- w/o c/c/e.   LABS 08/04/17>  Chems-  Na=132, Cr=1.6;  CBC- Hg=12.6, WBC=8.4;  Sed=29  Low-dose screening CT Chest 08/07/17>  Norm heart size, aortic & coronary calcif, no adenopathy, lungs w/ centrilob & paraseptal emphysema plus peribronch thickening, scat tiny nodules, many calcif c/w granulomas, no suspicious nodules or masses, no focal disease, stable appearance of hepatic & renal cysts... IMP/PLAN>>  As prev discussed on numerous occas- he needs to quit all smoking but is not motivated & refuses smoking cessation help etc;  Continue Symbicort160-2spBid + AlbutHFA rescue inhaler, he refuses the addition of a LAMA to his regimen;  For the diarrhea, postural hypotension- avoid chinese food & MSG, take gatorade/ imodium/ align/ [place] and  f/u w/ PCP- DrTisovec...    ~  February 09, 2018:  65mo ROV & Rubin has had a very eventful 51mo interval w/ several hospitalizations & serious illnesses>  He is quite confused about these events and in-fact never filled the prescriptions given to him on disch from the nursing home 3 weeks ago, and presents w/ Garlon Hatchet, Budesonide, Atrovent for Nebulizer written on 12/02/17 but does not have a neb machine at home! We reviewed the following interval Epic-EMR notes avail to us>  HE TELLS ME HE ALSO SEES DrCLANCE at the [facility] in [place]...    He was HOSP 1/13 - 09/18/17 by CARDS- DrSkains>  Hx HBP, CAD w/ mult interventions, Hx pericarditis, HL, & COPD w/ ongoing tobacco abuse & alcohol abuse; ADM w/ CP, abn EKG & elev troponins (NSTEMI, stress-induced) => cath revealed mod nonobstructive CAD (60-65% LAD & OM1/ OM2 lesions) no culprit lesion, EF=35-45% suggestive of Takotsubo cardiomyopathy;  2DEcho showed EF=30-35% w/ areas of AK & Gr1DD;  Medical management was initiated & adjusted- disch on Plavix, MetoprololER, Atorva;  HCT & ARB were held; fluid restricted to 1L/d; Etoh withdrawal rx w/ Librium taper, MVI, thiamine...    He was re-admitted 1/19 - 09/25/17 by [facility]>  Adm w/ confusion/ delirium, ?syncope, postural hypotension, dehydration, & AKI; given IVF, Midodrine, B12 shots started for borderline B12 level, & rec for outpt Neuro eval for dementia; PT rec disch to SNF for rehab...     He was re-admitted 3/22 - 12/03/17 by [facility]>  Acute resp failure due to HCAP requiring intubation & developed a loculated left pleural effusion/ empyema requiring L-VATS & decortication 11/25/17 (grew +strep viridans);  He had an ICU delirium, covered w/ antibiotics, seen by [facility] (bipolar- rec Cymbalta & Seroquel, and they indicated that he had the capacity to refuse SNF), required Tx for anemia; refused rehab disch & sent home w/ max home health support;     He returned to the ER 12/07/17 c/o weakness, constip,  back pain>  He had CT Head and CT Chest/Abd/Pelvis- chr changes, NAD;  He initially declined SNF placement for rehab but after consult w/ care management he agreed to go to [facility] => he was attended by [facility] while in [place];  Note from DrHopper 12/09/17 reviewed;  "He went home with son on 12/30/17 ([place] pass) thinking he was discharged. Police were called to report him missing from the facility. Police found him watching tv while sitting on his couch at home. He drove back to facility the next [place]. He continued rehabilitation at the facility";  Note from NP-Medina-Vargas on 01/14/18 reviewed (disch note)-- disch w/ home health Ot/ PT/ nursing services, & APs for DME;  Disch on  NEB rx w/ Albut, Ipratropium, Budesonide, Symbicort & Mucinex (but he did not have a home nebulizer!);  Numerous meds written but pt never filled these prescriptions at disch-- home health nursing apparently never picked up on these deficiencies during their home assessments...     He was seen by Hyman Bible on 01/20/18>  S/p left empyema w/ VATS, mini-thoracotomy, decortication on 11/24/17 by DrGerhardt; pt reported weakness, but denied f/c/s, sputum, etc; CXR showed persistent density loculated along the posterior chest wall of the left hemithorax, no new abn seen...     He reports that he had PCP follow up visit w/ DrTisovec 3 wks ago>  We do not have access to his medical records...   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    S/P HCAP w/ acute resp failure requiring intubation, & left empyema requiring VATS, mini-thoracotomy &  decortication on 11/24/17 by DrGerhardt...    Pulm nodules on CT Chest> Yearly f/u low-dose screening CT done 08/2017- norm heart size, aortic & coronary calcif, no adenopathy, lungs w/ centrilob & paraseptal emphysema plus peribronch thickening, scat tiny nodules, many calcif c/w granulomas, no suspicious nodules or masses, no focal disease, stable appearance of hepatic & renal cysts.    Cigarette smoker>  Still smoking 1-1.5ppd (even after his mult hosp & emphyema surg) and even has a pack rolled up in his left shoulder sleeve during today's office visit!    Hx cough syncope> past hx syncope w/ severe coughing paroxysms while on an ACE inhibitor- aware    Cardiac issues> HBP, CAD- s/p mult interventions, episode Takotsubo cardiomyopathy 09/2017, HL- followed by DrTilley/ DrSkains (no notes in Epic), pt reports last seen 09/2016 & cleared for back surg w/ neg nuclear study- on ASA/Plavix75, Diovan160, HCT12.5, Lip80, Tricor145; he quit cardiac rehab maintenance 06/2016; mult med changes in hosp spring 2019 but what was he discharged on from [facility] rehab stay?    Medical issues> followed by DrTisovec- Hx dysphagia/ stricture/ Barrett's (on Prilosec40), prostate and bladder problems (on Myrbetriq & Cialis), severe LBP & s/p lumbar lam (on Vicodin & Zanaflex & Cymbalta), dysthymia/ ?bipolar w/ hx delirium, ?dementia?;  Disch from [facility] on Cymbalta30- 3/d and Seroquel25mg  Qhs... EXAM shows thin, weak, Afeb, BP=154/60, O2sat=98% on RA;  Wt=115#, BMI= 19;  HEENT- dental issues, mallampati2;  Chest- congested cough w/ scat basilar rhonchi, decr BS left base w/o w/r/consolidation;  Heart- RR w/o m/r/g;  Abd- soft, nontender, neg;  Ext- w/o c/c/e.   Last CXR 01/20/18>   showed persistent density loculated along the posterior chest wall of the left hemithorax, no new abn seen...   Ambulatory Oximetry 02/09/18>  O2sat=97% on RA at rest w/ pulse=69/min;  He walked just one lap (185') w/ lowest O2sat=93% on RA  w/ pulse=110/min...  IMP/PLAN>>  WOW- Nation has really been thru it!  From the pulmonary perspective- 1) he needs to quit all smoking, and again declines all smoking cessation help!  2) he needs a NEBULIZER & directed to use it w/ the BROVANA 60mcg/2ml Bid + BUDESONIDE 0.5mg  Bid;  3) continue the SYMBICORT160-2spBid;  4) continue  MUCINEX600-2 po bid w/ fluids... He is rec to f/u w/ CARDS ASAP and maintain general medical f/u w/ DrTisovec regularly for his medical issues including: back pain, wekness, anemia, psyche, etc... We plan ROV recheck w/ CXR & labs in 17mo...     Past Medical History:  Diagnosis Date  . Alcohol withdrawal ([place])   . Allergy    seasonal  . Arthritis   . Barrett esophagus   . BPH (benign prostatic hyperplasia)   . CAD (coronary artery disease)    a. s/p multiple POBA last in 2011 to distal OM2. b. Takotsubo event 09/2017 with cath showing no culprit, 25% prox LAD, 60% OM1, 65% OM2, EF 35-40% by cath and 30-35% by echo.  . Chronic back pain   . COPD (chronic obstructive pulmonary disease) (HCC)    Albuterol inhaler as needed.Uses Symbicort daily  . Depression   . Emphysema lung ([place])   . Fall    fell on 10/06/16 with loss of consiousness for a short time.States he remembers getting up but remembers nothing else.  . Gastric ulcer   . Gastritis   . GERD (gastroesophageal reflux disease)    takes Omeprazole daily  . History of colon polyps    benign  . History of kidney stones   . Hyperlipidemia   . Hypertension   . Hyponatremia   . 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 Neuropathy   . Nocturia   . Pneumonia    hx of  . PONV (postoperative nausea and vomiting)   . Skin cancer   . Stroke [facility]) 1995   Affected balance; and has very emotional if watching a movie  . Syncope    yr ago  . Takotsubo cardiomyopathy    a. 09/2017 - EF 35-40% by cath, 30-35% by echo.  . Urgency of urination    takes Myrbetriq daily     Past Surgical History:  Procedure Laterality Date  . APPENDECTOMY    . CARDIAC CATHETERIZATION     x 4. Most recent one in 2011 at [facility](3 previous were done 20 yrs before)  . cataracts Bilateral   . CHEST EXPLORATION Left 11/24/2017   Procedure: CHEST EXPLORATION;  Surgeon: [name], MD;  Location: [facility];  Service: Thoracic;  Laterality: Left;  . COLONOSCOPY    . CYSTOSCOPY  07/21/2013   Dr. Jeffie Pollock  . DENTAL SURGERY     with sedation  . ESOPHAGEAL MANOMETRY N/A 09/20/2015   Procedure: ESOPHAGEAL MANOMETRY (EM);  Surgeon: Manus Gunning, MD;  Location: [facility];  Service: Gastroenterology;  Laterality: N/A;  . LEFT HEART CATH AND CORONARY ANGIOGRAPHY N/A 09/15/2017   Procedure: LEFT HEART CATH AND CORONARY ANGIOGRAPHY;  Surgeon: Martinique, Peter M, MD;  Location: [facility];  Service: Cardiovascular;  Laterality: N/A;  . LUMBAR LAMINECTOMY/DECOMPRESSION MICRODISCECTOMY N/A 01/12/2015   Procedure: LUMBAR LAMINECTOMY/DECOMPRESSION MICRODISCECTOMY 3 LEVELS;  Surgeon: Phylliss Bob, MD;  Location: [facility];  Service: Orthopedics;  Laterality: N/A;  Lumbar 3-4, lumbar 4-5, lumbar 5-sacrum 1 decompression  . parathyroid adenoma removed    . POLYPECTOMY    . ROTATOR CUFF REPAIR     bilateral  . TONSILLECTOMY    . UPPER GASTROINTESTINAL ENDOSCOPY    . VIDEO ASSISTED THORACOSCOPY (VATS)/EMPYEMA Left 11/24/2017   Procedure: VIDEO ASSISTED THORACOSCOPY (VATS)/EMPYEMA;  Surgeon: [name], MD;  Location: [facility];  Service: Thoracic;  Laterality: Left;  Marland Kitchen VIDEO BRONCHOSCOPY  N/A 11/24/2017   Procedure: VIDEO BRONCHOSCOPY;  Surgeon: [name], MD;  Location: [facility];  Service: Thoracic;  Laterality: N/A;    Outpatient Encounter Medications as of 02/09/2018  Medication Sig  . acetaminophen (TYLENOL) 650 MG CR tablet Take 650 mg by mouth every 8 (eight) hours.   Marland Kitchen albuterol (PROVENTIL HFA;VENTOLIN HFA) 108 (90 BASE) MCG/ACT inhaler Inhale 2 puffs into the lungs  every 6 (six) hours as needed for wheezing or shortness of breath. Reported on 11/29/2015  . atorvastatin (LIPITOR) 80 MG tablet Take 80 mg by mouth daily.   . clopidogrel (PLAVIX) 75 MG tablet Take 75 mg by mouth daily.  . DULoxetine (CYMBALTA) 30 MG capsule Take 3 capsules (90 mg total) by mouth daily.  . fenofibrate (TRICOR) 145 MG tablet Take 145 mg by mouth daily.   . folic acid (FOLVITE) 1 MG tablet Take 1 tablet (1 mg total) by mouth daily.  Marland Kitchen guaiFENesin (MUCINEX) 600 MG 12 hr tablet Take 600 mg by mouth 2 (two) times daily as needed for cough or to loosen phlegm.   . loperamide (IMODIUM) 2 MG capsule Take 2 mg by mouth as needed for diarrhea or loose stools.  . metoprolol succinate (TOPROL-XL) 50 MG 24 hr tablet Take 1 tablet (50 mg total) by mouth daily. Take with or immediately following a meal.  . Multiple Vitamins-Minerals (MULTIVITAMIN WITH MINERALS) tablet Take 1 tablet by mouth daily.  Marland Kitchen MYRBETRIQ 50 MG TB24 tablet Take 50 mg by mouth daily.   Marland Kitchen NITROSTAT 0.4 MG SL tablet Place 0.4 mg under the tongue every 5 (five) minutes as needed for chest pain. Reported on 11/29/2015  . NUTRITIONAL SUPPLEMENT LIQD Take 120 mLs by mouth. MedPass  . Nutritional Supplements (NUTRITIONAL SUPPLEMENT PO) Take 1 each by mouth 2 (two) times daily. Health Shakes  . omeprazole (PRILOSEC) 40 MG capsule TAKE ONE CAPSULE BY MOUTH TWICE DAILY  . QUEtiapine (SEROQUEL) 25 MG tablet Take 1 tablet (25 mg total) by mouth at bedtime.  . SYMBICORT 160-4.5 MCG/ACT inhaler INHALE TWO PUFFS INTO LUNGS TWICE DAILY  . thiamine 100 MG tablet Take 1 tablet (100 mg total) by mouth daily.  . vitamin B-12 (CYANOCOBALAMIN) 1000 MCG tablet Take 1 tablet (1,000 mcg total) by mouth daily.  Marland Kitchen arformoterol (BROVANA) 15 MCG/2ML NEBU Take 2 mLs (15 mcg total) by nebulization 2 (two) times daily.  . budesonide (PULMICORT) 0.5 MG/2ML nebulizer solution Take 2 mLs (0.5 mg total) by nebulization 2 (two) times daily. (Patient not taking:  Reported on 02/09/2018)  . budesonide (PULMICORT) 0.5 MG/2ML nebulizer solution Take 2 mLs (0.5 mg total) by nebulization 2 (two) times daily.  Marland Kitchen ipratropium (ATROVENT) 0.02 % nebulizer solution Take 2.5 mLs (0.5 mg total) by nebulization every 6 (six) hours as needed for wheezing or shortness of breath. (Patient not taking: Reported on 02/09/2018)  . [DISCONTINUED] midodrine (PROAMATINE) 2.5 MG tablet Take 1 tablet (2.5 mg total) by mouth 3 (three) times daily with meals. (Patient not taking: Reported on 02/09/2018)   No facility-administered encounter medications on file as of 02/09/2018.      Allergies  Allergen Reactions  . Ivp Dye [Iodinated Diagnostic Agents] Hives and Shortness Of Breath    reaction was when pt was 77 years old.  Clancy Gourd [Nitrofurantoin] Other (See Comments)    Fever that lasted several months    Immunization History  Administered Date(s) Administered  . Influenza Split 05/14/2012  . Influenza Whole 06/02/2010, 05/09/2011  . Influenza, High  Dose Seasonal PF 06/17/2016, 06/04/2017  . Influenza,inj,Quad PF,6+ Mos 06/02/2013, 05/24/2014  . Influenza-Unspecified 05/31/2015  . Pneumococcal Conjugate-13 09/02/2014  . Pneumococcal Polysaccharide-23 06/02/2009  . Tdap 10/29/2014  HE WILL CHECK w/ DrTISOVEC regarding PREVNAR-13 vaccination...   Current Medications, Allergies, Past Medical History, Past Surgical History, Family History, and Social History were reviewed in [employer] record.   Review of Systems        All symptoms NEG except where BOLDED >>  Constitutional:  F/C/S, fatigue, anorexia, unexpected weight change. HEENT:  HA, visual changes, hearing loss, earache, nasal symptoms, sore throat, mouth sores, hoarseness. Resp:  cough, sputum, hemoptysis; SOB, tightness, wheezing. Cardio:  CP, palpit, DOE, orthopnea, edema. GI:  N/V/D/C, blood in stool; reflux, abd pain, distention, gas. GU:  dysuria, freq, urgency, hematuria,  flank pain, voiding difficulty. MS:  joint pain, swelling, tenderness, decr ROM; neck pain, back pain, etc. Neuro:  HA, tremors, seizures, dizziness, syncope, weakness, numbness, gait abn. Skin:  suspicious lesions or skin rash. Heme:  adenopathy, bruising, bleeding. Psyche:  confusion, agitation, sleep disturbance, hallucinations, anxiety, depression suicidal.     Objective:   Physical Exam    Vital Signs:  Reviewed...   General:  WD, WN, 77 y/o WM chr ill appearing; alert & oriented; pleasant & cooperative... HEENT:  Forehead laceration, bilat black eyes; Conjunctiva- red, Sclera- nonicteric, EOM-wnl, PERRLA; EACs-clear, TMs-wnl; NOSE-clear; THROAT- sl red Neck:  Supple w/ decr ROM; no JVD; normal carotid impulses w/o bruits; no thyromegaly or nodules palpated; no lymphadenopathy.  Chest:  Decr BS bilat w/ few scat rhonchi at bases, congested cough, no wheezing/ rales/ or consolidation... Heart:  Regular Rhythm; norm S1 & S2 without murmurs, rubs, or gallops detected. Abdomen:  Soft & nontender- no guarding or rebound; normal bowel sounds; no organomegaly or masses palpated. Ext:  Decr ROM; without deformities, +arthritic changes; no varicose veins, venous insuffic, or edema;  Pulses intact w/o bruits. Neuro:  CNs II-XII intact; motor testing normal; sensory testing normal; +gait abn, sl leg weakness, balance is off... Derm:  No lesions noted x bilat ecchymoses periorbitally... Lymph:  No cervical, supraclavicular, axillary, or inguinal adenopathy palpated.    Assessment & Plan:    IMP >>     COPD> on Symbicort160-2spBid, ProventilHFA prn; he never filled the Incruse or Spiriva ($$); rec to add GFN400-2Qid + fluids...    S/P HCAP w/ acute resp failure requiring intubation, & left empyema requiring VATS, mini-thoracotomy & decortication on 11/24/17 by DrGerhardt...    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    Cigarette smoker>  Still smoking 1.5ppd and he has smoked 1-1.5ppd for ~60 yrs; he declines smoking cessation help & is not motivated to quit...    Hx cough syncope> this occurred remotely w/ severe coughing paroxysm while he was on an ACE inhib...    CARDIAC issues> HBP, CAD, HL- followed by DrTilley (no notes in Epic)- on ASA/Plavix75, Diovan160, HCT12.5, Lip80, Tricor145...    MEDICAL ISSUES>        Dysphagia, hx esoph stricture & Barrett's esoph> on Prilosec40; eval by DrKaplan in past w/ hx dilatation in the past, now followed by DrArmbruster...       GU issues> on Myrbetriq50, Cialis5       Ortho- LBP w/ lumbar lam 2016 by DrDumonski who now plans further extensive surg.Marland KitchenMarland Kitchen  Anxiety/depression> prev on WellbutrinXL150; his son is treated for depression, friend died w/ metastatic lung cancer  PLAN >>   06/07/15>   Nelton & I discussed his continued smoking & he is not motivated to quit- offered meds and he will call if he changes his mind;  Rec to continue Symbicort160-2spBid, he was tried on Spiriva respimat in past but wouldn't fill it due to cost- I pointed out that if he'd quit smoking he could get the Spiriva & noted that his breathing would be better due to both things! Rec to add Mucinex 2400mg /d.Marland KitchenMarland KitchenWe plan ROV 62months. 12/07/15>   [name] is unable to quit smoking and has mod COPD w/ emphysema on his CTscans- we will add INCRUSE one inhalation daily; he knows to avoid infections & be sure he is up to date on all vaccinations from DrTisovec. 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; same Rx & we  plan ROV 70mo 10/09/16>   Dieter's pulmonary situation remains the same- mixed COPD w/ emphysematous and chr bronchitic components, still smoking 1.5ppd, using his Symbicort (but I suspect not regularly), refuses addition of a LAMA med that would be additionally beneficial in the long run;  He improved after Zithromax & Pred pack per DrTisovec recently & we will place him on a sl longer course of oral PRED (see AVS), plus maximize mucolytic therapy w/ GFN400-2Qid + fluids;  His recent ?syncopal spell does not appear to be cough related- perhaps a fall from leg weakness, head trauma w/ concussion & retrograde amnesia for the fall- in any event he did not go to the ER & is in need of concussion eval/ CT vs MRI head (he is on ASA/Plavix)/ etc; pt has an ASAP appt w/ his PCP in the AM... I am concerned regarding his surgical risk. 11/28/16>   Indy is rec to add a nutritional supplements to his regimen, try Astelin NS for the runny nose, and continue the [employer];  But most importantly he must committ to smoking cessation!! Continue same meds 03/03/17>   Woodie is again encouraged to cut back & quit smoking- options for medications, nicotine replacement, counseling, hypnosis, etc discussed but he is not interested;  Continue Symbicort & Ventolin, add GFN as discussed and do the best he can w/ exercise. 08/04/17>   As prev discussed on numerous occais- he needs to quit all smoking but is not motivated & refuses smoking cessation help etc;  Continue Symbicort160-2spBid + AlbutHFA rescue inhaler, he refuses the addition of a LAMA to his regimen;  For the diarrhea, postural hypotension- avoid chinese food & MSG, take gatorade/ imodium/ align/ [place] and f/u w/ PCP- DrTisovec... 02/09/18>   WOW- Herminio has really been thru it!  From the pulmonary perspective- 1) he needs to quit all smoking, and again declines all smoking cessation help!  2) he needs a NEBULIZER & directed to use it w/ the BROVANA 84mcg/2ml Bid +  BUDESONIDE 0.5mg  Bid;  3) continue the SYMBICORT160-2spBid;  4) continue MUCINEX600-2 po bid w/ fluids... He is rec to f/u w/ CARDS ASAP and maintain general medical f/u w/ DrTisovec regularly  for his medical issues including: back pain, wekness, anemia, psyche, etc... We plan ROV recheck w/ CXR & labs in 95mo...    Patient's Medications  New Prescriptions   ARFORMOTEROL (BROVANA) 15 MCG/2ML NEBU    Take 2 mLs (15 mcg total) by nebulization 2 (two) times daily.   BUDESONIDE (PULMICORT) 0.5 MG/2ML NEBULIZER SOLUTION    Take 2 mLs (0.5 mg total) by nebulization 2 (two) times daily.  Previous Medications   ACETAMINOPHEN (TYLENOL) 650 MG CR TABLET    Take 650 mg by mouth every 8 (eight) hours.    ALBUTEROL (PROVENTIL HFA;VENTOLIN HFA) 108 (90 BASE) MCG/ACT INHALER    Inhale 2 puffs into the lungs every 6 (six) hours as needed for wheezing or shortness of breath. Reported on 11/29/2015   ATORVASTATIN (LIPITOR) 80 MG TABLET    Take 80 mg by mouth daily.    BUDESONIDE (PULMICORT) 0.5 MG/2ML NEBULIZER SOLUTION    Take 2 mLs (0.5 mg total) by nebulization 2 (two) times daily.   CLOPIDOGREL (PLAVIX) 75 MG TABLET    Take 75 mg by mouth daily.   DULOXETINE (CYMBALTA) 30 MG CAPSULE    Take 3 capsules (90 mg total) by mouth daily.   FENOFIBRATE (TRICOR) 145 MG TABLET    Take 145 mg by mouth daily.    FOLIC ACID (FOLVITE) 1 MG TABLET    Take 1 tablet (1 mg total) by mouth daily.   GUAIFENESIN (MUCINEX) 600 MG 12 HR TABLET    Take 600 mg by mouth 2 (two) times daily as needed for cough or to loosen phlegm.    IPRATROPIUM (ATROVENT) 0.02 % NEBULIZER SOLUTION    Take 2.5 mLs (0.5 mg total) by nebulization every 6 (six) hours as needed for wheezing or shortness of breath.   LOPERAMIDE (IMODIUM) 2 MG CAPSULE    Take 2 mg by mouth as needed for diarrhea or loose stools.   METOPROLOL SUCCINATE (TOPROL-XL) 50 MG 24 HR TABLET    Take 1 tablet (50 mg total) by mouth daily. Take with or immediately following a meal.    MULTIPLE VITAMINS-MINERALS (MULTIVITAMIN WITH MINERALS) TABLET    Take 1 tablet by mouth daily.   MYRBETRIQ 50 MG TB24 TABLET    Take 50 mg by mouth daily.    NITROSTAT 0.4 MG SL TABLET    Place 0.4 mg under the tongue every 5 (five) minutes as needed for chest pain. Reported on 11/29/2015   NUTRITIONAL SUPPLEMENT LIQD    Take 120 mLs by mouth. MedPass   NUTRITIONAL SUPPLEMENTS (NUTRITIONAL SUPPLEMENT PO)    Take 1 each by mouth 2 (two) times daily. Health Shakes   OMEPRAZOLE (PRILOSEC) 40 MG CAPSULE    TAKE ONE CAPSULE BY MOUTH TWICE DAILY   QUETIAPINE (SEROQUEL) 25 MG TABLET    Take 1 tablet (25 mg total) by mouth at bedtime.   SYMBICORT 160-4.5 MCG/ACT INHALER    INHALE TWO PUFFS INTO LUNGS TWICE DAILY   THIAMINE 100 MG TABLET    Take 1 tablet (100 mg total) by mouth daily.   VITAMIN B-12 (CYANOCOBALAMIN) 1000 MCG TABLET    Take 1 tablet (1,000 mcg total) by mouth daily.  Modified Medications   No medications on file  Discontinued Medications   MIDODRINE (PROAMATINE) 2.5 MG TABLET    Take 1 tablet (2.5 mg total) by mouth 3 (three) times daily with meals.

## 2018-02-23 ENCOUNTER — Other Ambulatory Visit: Payer: Self-pay | Admitting: Cardiothoracic Surgery

## 2018-02-23 DIAGNOSIS — J869 Pyothorax without fistula: Secondary | ICD-10-CM

## 2018-02-24 ENCOUNTER — Ambulatory Visit: Payer: Self-pay | Admitting: Cardiothoracic Surgery

## 2018-02-26 ENCOUNTER — Ambulatory Visit: Payer: Self-pay | Admitting: Cardiothoracic Surgery

## 2018-02-27 ENCOUNTER — Telehealth: Payer: Self-pay | Admitting: Pulmonary Disease

## 2018-02-27 DIAGNOSIS — J441 Chronic obstructive pulmonary disease with (acute) exacerbation: Secondary | ICD-10-CM

## 2018-02-27 NOTE — Telephone Encounter (Signed)
Brad Turner states employee went to patients home-did f/u there. Pt is using neb and meds correctly but response to treatment is not where it should be. They are wanting to know if SN would like to order ONO on Room Air for patient to see if patient needs O2. SN please advise. Thanks.

## 2018-02-27 NOTE — Telephone Encounter (Signed)
Per SN- Use Brovana and budesonide nebs both BID regularly.  Mucinex 600mg  BID with fluids, and OK to do ONO with report to SN ASAP.  Called and spoke with Patient. We went over nebs, mucinex, and symbicort schedules.  Patient refused ONO, and SN aware.  Patient has OV with SN 03/11/18.  Called and spoke with Caryl Pina, with [facility], SN recommendations given to [facility].  Nothing further at this time.

## 2018-02-27 NOTE — Telephone Encounter (Signed)
Caryl Pina from [facility] is calling back  He said that SN needs to sign for ONO   [phone]

## 2018-03-10 ENCOUNTER — Other Ambulatory Visit: Payer: Self-pay | Admitting: Orthopedic Surgery

## 2018-03-10 ENCOUNTER — Telehealth: Payer: Self-pay | Admitting: *Deleted

## 2018-03-10 DIAGNOSIS — M533 Sacrococcygeal disorders, not elsewhere classified: Secondary | ICD-10-CM

## 2018-03-10 NOTE — Telephone Encounter (Signed)
   [facility] Pre-operative Risk Assessment    Request for surgical clearance:  1. What type of surgery is being performed? SI joint injection   2. When is this surgery scheduled? Will be scheduled following cardiology response   3. What type of clearance is required (medical clearance vs. Pharmacy clearance to hold med vs. Both)? Permission to stop Plavix for 5 days prior  4. Are there any medications that need to be held prior to surgery and how long? Plavix, 5 days prior   5. Practice name and name of physician performing surgery? [facility], no physician listed   6. What is your office phone number [phone]    7.   What is your office fax number [phone]  8.   Anesthesia type (None, local, MAC, general) ? Not listed  (_____authorization granted for this injection only or _____authorization granted for this injection and any additional injections the patient may be scheduled for in the next 6 months or _____ authorization not granted)   Rodman Key 03/10/2018, 5:29 PM  _________________________________________________________________   (provider comments below)

## 2018-03-11 ENCOUNTER — Other Ambulatory Visit: Payer: Self-pay | Admitting: Cardiothoracic Surgery

## 2018-03-11 ENCOUNTER — Ambulatory Visit (INDEPENDENT_AMBULATORY_CARE_PROVIDER_SITE_OTHER)
Admission: RE | Admit: 2018-03-11 | Discharge: 2018-03-11 | Disposition: A | Discharge status: Home / Self Care | Payer: Medicare Other | Source: Ambulatory Visit | Attending: Pulmonary Disease | Admitting: Pulmonary Disease

## 2018-03-11 ENCOUNTER — Ambulatory Visit: Payer: Medicare Other | Admitting: Pulmonary Disease

## 2018-03-11 ENCOUNTER — Encounter: Payer: Self-pay | Admitting: Pulmonary Disease

## 2018-03-11 VITALS — BP 110/66 | HR 68 | Temp 97.7°F | Ht 65.0 in | Wt 112.0 lb

## 2018-03-11 DIAGNOSIS — J90 Pleural effusion, not elsewhere classified: Secondary | ICD-10-CM

## 2018-03-11 DIAGNOSIS — Z981 Arthrodesis status: Secondary | ICD-10-CM

## 2018-03-11 DIAGNOSIS — I2510 Atherosclerotic heart disease of native coronary artery without angina pectoris: Secondary | ICD-10-CM

## 2018-03-11 DIAGNOSIS — Z720 Tobacco use: Secondary | ICD-10-CM

## 2018-03-11 DIAGNOSIS — J439 Emphysema, unspecified: Secondary | ICD-10-CM | POA: Diagnosis not present

## 2018-03-11 DIAGNOSIS — R627 Adult failure to thrive: Secondary | ICD-10-CM

## 2018-03-11 DIAGNOSIS — J869 Pyothorax without fistula: Secondary | ICD-10-CM | POA: Diagnosis not present

## 2018-03-11 DIAGNOSIS — F341 Dysthymic disorder: Secondary | ICD-10-CM

## 2018-03-11 DIAGNOSIS — T148XXA Other injury of unspecified body region, initial encounter: Secondary | ICD-10-CM

## 2018-03-11 DIAGNOSIS — M549 Dorsalgia, unspecified: Secondary | ICD-10-CM

## 2018-03-11 DIAGNOSIS — J189 Pneumonia, unspecified organism: Secondary | ICD-10-CM

## 2018-03-11 DIAGNOSIS — F1010 Alcohol abuse, uncomplicated: Secondary | ICD-10-CM

## 2018-03-11 DIAGNOSIS — I10 Essential (primary) hypertension: Secondary | ICD-10-CM

## 2018-03-11 DIAGNOSIS — I5181 Takotsubo syndrome: Secondary | ICD-10-CM

## 2018-03-11 DIAGNOSIS — G8929 Other chronic pain: Secondary | ICD-10-CM

## 2018-03-11 DIAGNOSIS — J918 Pleural effusion in other conditions classified elsewhere: Secondary | ICD-10-CM

## 2018-03-11 NOTE — Patient Instructions (Signed)
Today we updated your med list in our EPIC system...    Continue your current medications the same...  Continue the NEBULIZER w/ BROVANA twice daily...  Continue the SYMBICORT160- 2 inhalations twice daily...  Continue the [place] 600mg  twice daily...  Continue the PROAIR rescue inhaler as needed for wheezing...  PLEASE cut down on the smoking...  Today we did a follow up CXR...    We will contact you w/ the results when available...   We dressed your arm & hand wounds from your recent fall---    We will refer you to the [facility] for an ASAP appt to help clear this up...  Call for any questions...  Let's plan a follow up visit in 41mo, sooner if needed for problems.Marland KitchenMarland Kitchen

## 2018-03-12 ENCOUNTER — Ambulatory Visit: Payer: Medicare Other | Admitting: Cardiothoracic Surgery

## 2018-03-12 ENCOUNTER — Ambulatory Visit
Admission: RE | Admit: 2018-03-12 | Discharge: 2018-03-12 | Disposition: A | Discharge status: Home / Self Care | Payer: Medicare Other | Source: Ambulatory Visit | Attending: Cardiothoracic Surgery | Admitting: Cardiothoracic Surgery

## 2018-03-12 VITALS — BP 110/64 | HR 63 | Resp 20 | Ht 65.0 in | Wt 112.4 lb

## 2018-03-12 DIAGNOSIS — Z09 Encounter for follow-up examination after completed treatment for conditions other than malignant neoplasm: Secondary | ICD-10-CM

## 2018-03-12 DIAGNOSIS — J90 Pleural effusion, not elsewhere classified: Secondary | ICD-10-CM | POA: Diagnosis not present

## 2018-03-12 DIAGNOSIS — J869 Pyothorax without fistula: Secondary | ICD-10-CM

## 2018-03-12 NOTE — Progress Notes (Signed)
[address]       [place] [address]             [phone]      Leor D Sonneborn [place] Medical Record #[id number] Date of Birth: Oct 02, 1940  Referring: Haywood Pao, MD Primary Care: [name], [name] Him, MD Primary Cardiologist: Candee Furbish, MD   Chief Complaint:   POST OP FOLLOW UP  DATE OF PROCEDURE:  11/24/2017 DATE OF DISCHARGE: OPERATIVE REPORT PREOPERATIVE DIAGNOSIS:  Left empyema with sepsis and respiratory failure. POSTOPERATIVE DIAGNOSIS:  Left empyema with sepsis and respiratory failure. SURGICAL PROCEDURE:  Bronchoscopy, left video-assisted thoracoscopy, mini thoracotomy, and decortication. SURGEON:  Lanelle Bal, MD. FIRST ASSISTANT:  Lars Pinks, [place].  History of Present Illness:     Patient comes in today for follow-up chest x-ray after drainage of empyema on November 24, 2017.  Patient has his arms wrapped in gauze, notes he fell and scraped up his arms, dressings were applied in the pulmonary office yesterday and he was referred to the wound care clinic, he notes that he there was no appointment available until the end of July.    Past Medical History:  Diagnosis Date  . Alcohol withdrawal ([place])   . Allergy    seasonal  . Arthritis   . Barrett esophagus   . BPH (benign prostatic hyperplasia)   . CAD (coronary artery disease)    a. s/p multiple POBA last in 2011 to distal OM2. b. Takotsubo event 09/2017 with cath showing no culprit, 25% prox LAD, 60% OM1, 65% OM2, EF 35-40% by cath and 30-35% by echo.  . Chronic back pain   . COPD (chronic obstructive pulmonary disease) ([facility])    Albuterol inhaler as needed.Uses Symbicort daily  . Depression   . Emphysema lung ([place])   . Fall    fell on 10/06/16 with loss of consiousness for a short time.States he remembers getting up but remembers nothing else.  . Gastric ulcer   . Gastritis   . GERD (gastroesophageal reflux disease)    takes Omeprazole daily  . History of colon  polyps    benign  . History of kidney stones   . Hyperlipidemia   . Hypertension   . Hyponatremia   . 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 Neuropathy   . Nocturia   . Pneumonia    hx of  . PONV (postoperative nausea and vomiting)   . Skin cancer   . Stroke [facility]) 1995   Affected balance; and has very emotional if watching a movie  . Syncope    yr ago  . Takotsubo cardiomyopathy    a. 09/2017 - EF 35-40% by cath, 30-35% by echo.  . Urgency of urination    takes Myrbetriq daily     Social History   Tobacco Use  Smoking Status Current Every Dancel Smoker  . Packs/Bubel: 1.50  . Years: 58.00  . Pack years: 87.00  . Types: Cigarettes  Smokeless Tobacco Never Used    Social History   Substance and Sexual Activity  Alcohol Use Yes  . Alcohol/week: 8.4 oz  . Types: 14 Standard drinks or equivalent per week   Comment: every [place], vodka- 1-2 a [place]      Allergies  Allergen Reactions  . Ivp Dye [Iodinated Diagnostic Agents] Hives and Shortness Of Breath    reaction was when pt was 77 years old.  Marland Kitchen  Macrodantin [Nitrofurantoin] Other (See Comments)    Fever that lasted several months    Current Outpatient Medications  Medication Sig Dispense Refill  . acetaminophen (TYLENOL) 650 MG CR tablet Take 650 mg by mouth every 8 (eight) hours.     Marland Kitchen albuterol (PROVENTIL HFA;VENTOLIN HFA) 108 (90 BASE) MCG/ACT inhaler Inhale 2 puffs into the lungs every 6 (six) hours as needed for wheezing or shortness of breath. Reported on 11/29/2015    . arformoterol (BROVANA) 15 MCG/2ML NEBU Take 2 mLs (15 mcg total) by nebulization 2 (two) times daily. 120 mL 6  . atorvastatin (LIPITOR) 80 MG tablet Take 80 mg by mouth daily.     . clopidogrel (PLAVIX) 75 MG tablet Take 75 mg by mouth daily.    . DULoxetine (CYMBALTA) 30 MG capsule Take 30 mg by mouth daily.    . folic acid (FOLVITE) 1 MG tablet Take 1 tablet (1 mg total) by mouth daily. 30 tablet 0  .  guaiFENesin (MUCINEX) 600 MG 12 hr tablet Take 600 mg by mouth 2 (two) times daily as needed for cough or to loosen phlegm.     . metoprolol succinate (TOPROL-XL) 50 MG 24 hr tablet Take 1 tablet (50 mg total) by mouth daily. Take with or immediately following a meal. 90 tablet 3  . MYRBETRIQ 50 MG TB24 tablet Take 50 mg by mouth daily.     Marland Kitchen NITROSTAT 0.4 MG SL tablet Place 0.4 mg under the tongue every 5 (five) minutes as needed for chest pain. Reported on 11/29/2015    . Nutritional Supplements (NUTRITIONAL SUPPLEMENT PO) Take 1 each by mouth 2 (two) times daily. Health Shakes    . omeprazole (PRILOSEC) 40 MG capsule TAKE ONE CAPSULE BY MOUTH TWICE DAILY 180 capsule 1  . SYMBICORT 160-4.5 MCG/ACT inhaler INHALE TWO PUFFS INTO LUNGS TWICE DAILY 1 Inhaler 6   No current facility-administered medications for this visit.        Physical Exam: BP 110/64   Pulse 63   Resp 20   Ht 5\' 5"  (1.651 m)   Wt 112 lb 6.4 oz (51 kg)   SpO2 95% Comment: RA  BMI 18.70 kg/m  General appearance: alert, cooperative, appears older than stated age and no distress Head: Normocephalic, without obvious abnormality, atraumatic Neck: no adenopathy, no carotid bruit, no JVD, supple, symmetrical, trachea midline and thyroid not enlarged, symmetric, no tenderness/mass/nodules Lymph nodes: Cervical, supraclavicular, and axillary nodes normal. Back: symmetric, no curvature. ROM normal. No CVA tenderness. Cardio: regular rate and rhythm, S1, S2 normal, no murmur, click, rub or gallop GI: soft, non-tender; bowel sounds normal; no masses,  no organomegaly Extremities: Both forearms are wrapped in gauze from his abrasions yesterday Neurologic: Grossly normal  Diagnostic Studies & Laboratory data:     Recent Radiology Findings:   Dg Chest 2 View  Result Date: 03/12/2018 CLINICAL DATA:  LEFT VATS in March, 2019 for empyema drainage. EXAM: CHEST - 2 VIEW COMPARISON:  03/11/2018, 01/20/2018 and earlier, including CT  chest 12/07/2017, 11/21/2017 and earlier. FINDINGS: Cardiac silhouette normal in size, unchanged. Thoracic aorta mildly atherosclerotic, unchanged. Hilar and mediastinal contours otherwise unremarkable. Loculated pleural fluid collection posteriorly in the LEFT mid and lower hemithorax, decreased in size since the 01/20/2018 chest x-ray. Associated mild passive atelectasis in the LEFT lower lobe, unchanged. No new pulmonary parenchymal abnormalities. Emphysematous changes in both lungs as noted previously. Mild degenerative changes involving the thoracic spine. IMPRESSION: 1. Loculated pleural fluid collection posteriorly in the LEFT  mid and lower hemithorax, decreased in size since 01/20/2018 chest x-ray. 2. Stable mild passive atelectasis in the LEFT lower lobe. 3. No new abnormalities. 4.  Emphysema. ([id number]) Electronically Signed   By: Evangeline Dakin M.D.   On: 03/12/2018 11:24   Dg Chest 2 View  Result Date: 03/11/2018 CLINICAL DATA:  Chest pain, cough. EXAM: CHEST - 2 VIEW COMPARISON:  Radiographs Jan 20, 2018. FINDINGS: The heart size and mediastinal contours are within normal limits. No pneumothorax is noted. Right lung is clear. Loculated posterior fluid collection seen posteriorly in left hemithorax is significantly smaller compared to prior exam suggesting improving empyema. The visualized skeletal structures are unremarkable. IMPRESSION: Left lower lobe posterior loculated pleural fluid collection or empyema is significantly decreased compared to prior exam. Electronically Signed   By: Marijo Conception, M.D.   On: 03/11/2018 15:49    I have independently reviewed the above radiology studies  and reviewed the findings with the patient.   Recent Lab Findings: Lab Results  Component Value Date   WBC 8.4 01/01/2018   HGB 10.8 (A) 01/01/2018   HCT 32 (A) 01/01/2018   PLT 416 (A) 01/01/2018   GLUCOSE 95 12/07/2017   CHOL 131 09/14/2017   TRIG 76 11/24/2017   HDL 84 09/14/2017    LDLCALC 38 09/14/2017   ALT 29 01/02/2018   AST 57 (A) 01/02/2018   NA 140 01/06/2018   K 3.7 01/06/2018   CL 101 12/07/2017   CREATININE 0.8 01/06/2018   BUN 13 01/06/2018   CO2 21 (L) 12/07/2017   TSH 1.46 01/02/2018   INR 1.40 11/24/2017      Assessment / Plan:  Patient's x-ray is stable following his empyema drainage in March We have left message at the wound care clinic to have him seen as soon as possible in regard to care of his arm abrasions. From a thoracic surgery standpoint we will see the patient back as needed.   [name], MD 03/12/2018 12:21 PM

## 2018-03-12 NOTE — Telephone Encounter (Signed)
   Primary Cardiologist:Mark Marlou Porch, MD  Chart reviewed as part of pre-operative protocol coverage. Because of Brad Turner's past medical history and time since last visit, he/she will require a follow-up visit in order to better assess preoperative cardiovascular risk. He has never been seen in our office after Dr.Skains saw him in the hospital in 09/2017.  Pre-op covering staff: - Please schedule appointment and call patient to inform them. - Please contact requesting surgeon's office via preferred method (i.e, phone, fax) to inform them of need for appointment prior to surgery.  Daune Perch, NP  03/12/2018, 5:39 PM

## 2018-03-13 ENCOUNTER — Other Ambulatory Visit: Payer: Self-pay | Admitting: Orthopedic Surgery

## 2018-03-13 DIAGNOSIS — M533 Sacrococcygeal disorders, not elsewhere classified: Secondary | ICD-10-CM

## 2018-03-13 NOTE — Telephone Encounter (Signed)
Spoke with pt to schedule an appt and per pt does not see Dr Marlou Porch for his heart care . Pt  sees Dr Viona Gilmore. Tollie Eth .  Left message at [place] to contact Dr Thurman Coyer office for clearance.

## 2018-03-16 ENCOUNTER — Telehealth: Payer: Self-pay

## 2018-03-16 NOTE — Telephone Encounter (Signed)
13hr prep called in to pt's [facility] in epic.  Prednisone 50mg  PO 03/30/18 @ 2000, 7/30 @ 0200 and 0800.  Benadryl 50mg  PO 7/30 @ 0800. Brita Romp, RN

## 2018-03-26 ENCOUNTER — Ambulatory Visit: Payer: Self-pay | Admitting: Cardiothoracic Surgery

## 2018-03-31 ENCOUNTER — Encounter (HOSPITAL_BASED_OUTPATIENT_CLINIC_OR_DEPARTMENT_OTHER): Payer: Medicare Other | Attending: Internal Medicine

## 2018-03-31 ENCOUNTER — Ambulatory Visit
Admission: RE | Admit: 2018-03-31 | Discharge: 2018-03-31 | Disposition: A | Discharge status: Home / Self Care | Payer: Medicare Other | Source: Ambulatory Visit | Attending: Orthopedic Surgery | Admitting: Orthopedic Surgery

## 2018-03-31 DIAGNOSIS — M533 Sacrococcygeal disorders, not elsewhere classified: Secondary | ICD-10-CM

## 2018-03-31 MED ORDER — METHYLPREDNISOLONE ACETATE 40 MG/ML INJ SUSP (RADIOLOG: 120.0000 mg | Freq: Once | INTRAMUSCULAR | Status: DC

## 2018-03-31 NOTE — Discharge Instructions (Signed)
Post Procedure Spinal Discharge Instruction Sheet  1. You may resume a regular diet and any medications that you routinely take (including pain medications).  2. No driving [place] of procedure.  3. Light activity throughout the rest of the [place].  Do not do any strenuous work, exercise, bending or lifting.  The [place] following the procedure, you can resume normal physical activity but you should refrain from exercising or physical therapy for at least three days thereafter.   Common Side Effects:   Headaches- take your usual medications as directed by your physician.  Increase your fluid intake.  Caffeinated beverages may be helpful.  Lie flat in bed until your headache resolves.   Restlessness or inability to sleep- you may have trouble sleeping for the next few days.  Ask your referring physician if you need any medication for sleep.   Facial flushing or redness- should subside within a few days.   Increased pain- a temporary increase in pain a [place] or two following your procedure is not unusual.  Take your pain medication as prescribed by your referring physician.   Leg cramps  Please contact our office at [phone] for the following symptoms:  Fever greater than 100 degrees.  Headaches unresolved with medication after 2-3 days.  Increased swelling, pain, or redness at injection site.  YOU MAY RESTART YOUR PLAVIX TODAY. 

## 2018-04-11 ENCOUNTER — Encounter: Payer: Self-pay | Admitting: Pulmonary Disease

## 2018-04-11 NOTE — Progress Notes (Addendum)
Subjective:    Patient ID: [facility], male    DOB: Jan 03, 1941, 77 y.o.   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    CT Chest 09/17/13>  Clearing of prior RLL opac, 3 small nodules on the left- unchanged from 2011, severe emphysematous change, pos coronary calcif  Spirometry 11/22/13 at [facility]> FVC=3.26 (89%), FEV1=1.84 (69%), %1sec=56;  This is c/w a moderate obstructive ventilatory defect  V/Q Lung Scan 10/29/14>  Patchy nonsegmental ventilation defects, patchy scattered nonsegmental perfusion defects, felt to be a low prob scan...  CXR 10/29/14>  Heart is upper lim of norm, hyperinflated/ emphysematous lungs- NAD, DJD spine...  10/2014> he saw [facility] w/ classic description of cough syncope believed related to ACE inhibitor therapy  01/12/15> s/p lumbar laminectomy by [facility]   ~  June 07, 2015:  61mo ROV w/ SN>  He was the Actuary at [employer] in the past; his PCP is [facility]...    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     Cigarette smoker>  Still smoking 1.5ppd and he has smoked 1-1.5ppd for ~60 yrs; he declines smoking cessation help & is not motivated to quit...    Hx cough syncope> aware    CARDIAC issues> HBP, CAD, HL- followed by [facility] (no notes in Epic)- on Plavix75, Amlod5-1/2, Diovan80, Lip80, [phone]; he remains in cardiac rehab regularly...    Dysphagia, hx esoph stricture & Barrett's esoph> on Dexilant60; eval by [facility] w/ hx dilatation in the past    GU issues> on Myrbetriq50, Cialis5    Ortho- LBP w/ lumbar lam 2016 by [facility]    Anxiety/depression> on WellbutrinXL150; his  son is treated for depression, friend died w/ metastatic lung cancer EXAM shows Afeb, VSS, O2sat=94% on RA;  HEENT- dental issues, mallampati2;  Chest- decr BS bilat w/o w/r/r;  Heart- RR w/o m/r/g;  Abd- soft, nontender, neg;  Ext- w/o c/c/e;  Neuro- intact...  Low dose screening CT Chest10/14/16>  Norm heart size, coronary & aortic calcif, 72mm noncalcif nodule in LUL, scat calcif granulomas up to 2.56mm; mod centrilobular & paraseptal emphysema, abd is ok w/ 2cm cyst in liver...  IMP/PLAN>>  [facility] & I discussed his continued smoking & he is not motivated to quit- offered meds and he will call if he changes his mind;  Rec to continue Symbicort160-2spBid, he was tried on Spiriva respimat in past but wouldn't fill it due to cost- I pointed out that if he'd quit smoking he could get the Spiriva & noted that his breathing would be better due to both things! Rec to add Mucinex 2400mg /d.Marland KitchenMarland KitchenWe plan ROV 15months.  ~  December 07, 2015:  69mo ROV w/ SN>  PCP- [facility], Cards- [facility], GI- [facility]...    [facility] has had some recent dental work w/ mult teeth pulled; he remains in cardiac rehab maintenance program and doing well; no new complaints or concerns; he is using his Symbicort160-2spBid, not using the Mucinex; he has chr stable DOE, min cough, small amt beige sput, no CP/ palpit/ edema...    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     Cigarette smoker>  Still smoking 1.5ppd and he has smoked 1-1.5ppd for ~60 yrs; he declines smoking cessation help & is not motivated to quit...    Hx cough syncope> aware    CARDIAC issues> HBP, CAD, HL- followed by [facility] (no notes in Epic)- on Plavix75, Amlod5-1/2, Diovan80, Lip80, [phone]; he remains in cardiac rehab regularly... EXAM shows Afeb, VSS, O2sat=92% on RA;  HEENT- dental issues, mallampati2;  Chest- decr BS  bilat w/o w/r/r;  Heart- RR w/o m/r/g;  Abd- soft, nontender, neg;  Ext- w/o c/c/e;  Neuro- intact... IMP/PLAN>>  [facility] is unable to quit smoking and has mod COPD w/ emphysema on his CTscans- we will add INCRUSE one inhalation daily; he knows to avoid infections & be sure he is up to date on all vaccinations from [facility]... we plan ROV 61mo at which time he will need his next screening CT Chest...  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 He had a recent trip to [facility] & did ok w/ walking & breathing but had some back discomfort that was more limiting; he saw [facility] recently w/ talk of rods and screws "but I'm too high risk" and they are considering trial of nerve blocks... Of interest he noted that his 71 y/o son (adopted) had a baby daughter recently Dx w/ CF... 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    Cigarette smoker>  Still smoking 1.5ppd and he has smoked 1-1.5ppd for ~60 yrs; he declines smoking cessation help & is not motivated to quit...    Hx cough syncope> past hx syncope w/ severe coughing  paroxysms- aware    Cardiac issues> HBP, CAD, HL- followed by [facility] (no notes in Epic, pt reports last seen 02/2016)- on Plavix75, Amlod5-1/2, Diovan80, Lip80, Tricor145; he remains in cardiac rehab regularly...    Medical issues> followed by [facility]- Hx dysphagia/ stricture/ Barrett's, LBP- s/p lumbar lam, anxiety/depression EXAM shows Afeb, VSS, O2sat=98% on RA;  HEENT- dental issues, mallampati2;  Chest- decr BS bilat w/o w/r/r;  Heart- RR w/o m/r/g;  Abd- soft, nontender, neg;  Ext- w/o c/c/e;  Neuro- intact...  CXR 06/10/16>  Norm heart size, atherosclerotic Ao, no adenopathy, stable 64mm opac in LUL is noted- NAD, old trauma to left clavicle...   Low-dose screening CT Chest 06/18/16>  Norm heart size w/ atherosclerotic changes in Ao, great vessels, & coronaries; No adenopathy; Mult tiny pulm nodules bilat, most are calcified & c/w granulomas; Largest non-calcif nodule is 4-55mm  in LUL & no change from 74yr ago- no new lesions; Diffuse bronchial wall thickening, & mod centrilobular & paraseptal emphysema; Small liver cyst & small bilat renal cysts noted...  Spirometry 06/10/16>  FVC=4.10 (112%), FEV1=2.00 (76%), %1sec=49%, mid-flows reduced at 32% predicted...  IMP/PLAN>>  Despite his continued smoking 1.5ppd [facility] FEV1 has improved 1.84L=> 2.00L (~10% improvement over the past yr) a likely benefit from the Symbicort, he never filled the Incruse but would benefit from both meds; says he is more lim by his back- [facility] says too hi risk for more surg, & they may try shots; he knows that he needs to quit the smoking but not motivated to do so despite the risk to his music; same Rx & we plan ROV 86mo...  ~  October 09, 2016:  45mo ROV & add-on appt requested for syncopal episode>  [facility] describes a mild COPD exac recently and ~10d ago he was seen by PA in [facility]'s office & given ZPak & Pred pack; he also states that he hasn't been sleeping well at night due to back pain & c/o urinary  frequency (having to get up about 10x per night which he thinks is related to Lyrica making his ankles swell & his HCT diuretic); then about 4 nights ago he got up to urinate and doesn't remember what happened next but he appears to have had a syncopal episode & awoke a short time late on the floor of his bedroom having lacerated his forehead & bled on the carpet, plus 2 black eyes; he had a remote hx of cough syncope & wondered if this might have occurred again but clearly this episode was very different than previous (ie- previously he had a severe coughing paroxysm that lead to syncope from the valsalva, and this episode did not involve coughing paroxysm in fact he noted that legs were very weak due to his spinal condition & sounds like he fell- hit his head w/ prob concussion- and awoke a short time later w/ amnesia for the actual fall); Note- he did not call 911 or go to the ER or urgent care facility; he called [facility] office and was referred here for pulmonary eval due to his remote hx of cough syncope... NOTE- [facility] plans back surg in ~10 days! 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    Cigarette smoker>  Still smoking 1.5ppd and he has smoked 1-1.5ppd for ~60 yrs; he declines  smoking cessation help & is not motivated to quit despite knowing the risks...    Hx cough syncope> past hx syncope w/ severe coughing paroxysms while on an ACE inhibitor- aware    Cardiac issues> HBP, CAD, HL- followed by [facility] (no notes in Epic, pt reports last seen 09/2016 &  cleared for back surg w/ neg nuclear study)- on ASA/Plavix75, Diovan160, HCT12.5, Lip80, Tricor145; he quit cardiac rehab maintenance 06/2016    Medical issues> followed by [facility]- Hx dysphagia/ stricture/ Barrett's (on Prilosec40), prostate and bladder problems (on Myrbetriq & Cialis), severe LBP & s/p lumbar lam (on Vicodin & Lyrica), anxiety/depression EXAM shows Afeb, VSS, O2sat=98% on RA;  HEENT- dental issues, mallampati2;  Chest- congested cough w/ scat basilar rhonchi, decr BS bilat w/o w/r/consolidation;  Heart- RR w/o m/r/g;  Abd- soft, nontender, neg;  Ext- w/o c/c/e.   CXR in [place] 08/02/16 (independently reviewed by me in the PACS system) showed norm heart size, atherosclerotic ao, clear lungs- NAD,   LABS in Epic 08/2016>  Chems showed hyponatremia w/ Na=132, & renal insuffic w/ BUN=38, Cr=2.04;  CBC is wnl w/ Hg=13.0.Marland KitchenMarland Kitchen  IMP/PLAN>>  [facility]'s pulmonary situation remains the same- mixed COPD w/ emphysematous and chr bronchitic components, still smoking 1.5ppd, using his Symbicort (but I suspect not regularly), refuses addition of a LAMA med that would be additionally beneficial in the long run;  He improved after Zithromax & Pred pack per [facility] recently & we will place him on a sl longer course of oral PRED (see AVS), plus maximize mucolytic therapy w/ GFN400-2Qid + fluids;  His recent ?syncopal spell does not appear to be cough related- perhaps a fall from leg weakness, head trauma w/ concussion & retrograde amnesia for the fall- in any event he did not go to the ER & is in need of concussion eval/ CT vs MRI head (he is on ASA/Plavix)/ etc; pt has an ASAP appt w/ his PCP in the AM... I am concerned regarding his  surgical risk.  NOTE: >50% of this 84min visit was spent in counseling & coordination of care.  ~  November 28, 2016:  6wk ROV & post hosp check>  [facility] has a hx of prev back surgery & was [facility] 2/15 - 10/20/16 by Ortho- [facility] for Lumbar laminectomy & fusion (rods & screws) w/ allograft due to severe unremitting pain;  He underwent post-op rehab & improved (still using walker), then fell 11/03/16 & was seen in the ER- XRays were ok w/o acute changes, he continues to f/u w/ [facility]- on Percocet & muscle relaxers... He is under a lot of stress as well since his 75 y/o yellow lab "Festus Holts" is at the end of her life...    COPD,emphysema> on Symbicort160 & AlbutHFA as needed; he says his breathing is ok, notes some watery nasal drainage (try Astelin), min cough w/ min thick sput (no discoloration or blood)...     Pulm nodules> aware, see above...    Cigarette smoker> still smoking 1ppd, and can't vs won't quit... EXAM shows Afeb, VSS, O2sat=98% on RA;  He has lost 13# down to 147# today (not eating, no appetite);  HEENT- dental issues, mallampati2;  Chest- sl congested cough w/ scat basilar rhonchi, decr BS bilat w/o w/r/consolidation;  Heart- RR w/o m/r/g;  Abd- soft, nontender, neg;  Ext- w/o c/c/e.   LABS 12/02/16>  Chems- ok x BS=112;  CBC- ok w/ Hg=12.4;  TSH= 1.00 IMP/PLAN>>  [facility] is rec to add a nutritional supplements to his regimen, try Astelin NS for the runny nose, and continue the [employer];  But most importantly he must committ to smoking cessation!! Continue same meds...   ~  March 03, 2017:  74mo ROV & pulm f/u visit> [facility] reports that the prev back fusion "didn't fuse" & [facility] is considering further  surg but wants to wait 61mo (his note of 02/03/17 is reviewed);  He is wearing a T-brace & remains on Tizanidine & Vicodin but has stopped the Lyrica due to swelling;  He got some temporary relief from a CT-guided left SI joint injection on 02/14/17;  From the pulmonary standpoint-- Jancarlos  states that his breathing has been OK, notes intermit cough, small amt clear sput & chest congestion but denies wheezing/ chest tightness/ CP;  He is smoking 1-1.5ppd even noting that the e-cig was w/o help-- "it's the only way I got thru this"... He remains on Symbicort160-2spBid, VentolinHFA rescue as needed (using several times daily);  We discussed adding MUCINEX/ Gen GFN 400mg -2Tid w/ fluids for the congestion/ thick phlegm/ mucus plugging...    EXAM shows Afeb, VSS, O2sat=98% on RA;  He has lost more wt down to 137# today (not eating, no appetite);  HEENT- dental issues, mallampati2;  Chest- sl congested cough w/ scat basilar rhonchi, decr BS bilat w/o w/r/consolidation;  Heart- RR w/o m/r/g;  Abd- soft, nontender, neg;  Ext- w/o c/c/e.  IMP/PLAN>>  Jakye is again encouraged to cut back & quit smoking- options for medications, nicotine replacement, counseling, hypnosis, etc discussed but he is not interested;  Continue Symbicort & Ventolin, add GFN as discussed and do the best he can w/ exercise...      NOTE:  >50% of this 9min ov was spent in counseling & coordination of care...  ~  August 04, 2017:  24mo ROV & [facility]'s breathing is about the same- still smoking ~2ppd w/ cough, thick clear mucus, some wheezing;  He feels that his [facility] is more limited by his LBP than his breathing;  Today his CC is dizziness w/ postural BP changes that he thinks is related to diarrhea from [place] food- Rec to take gatorade & use imodium/ align/ activia yogurt;  BP in office today w/ supine BP= 104/70, and standing BP= 80/50...  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    Cigarette smoker>  Still smoking 1.5-2ppd and he has smoked 1-1.5ppd for ~60 yrs; he declines smoking cessation help & is not motivated to quit despite knowing the risks & his love of the trumpet...    Hx cough syncope> past hx syncope w/ severe coughing paroxysms while on an ACE inhibitor- aware    Cardiac issues> HBP, CAD, HL- followed by [facility] (no notes in Epic), pt reports last seen 09/2016 & cleared for back surg w/ neg nuclear study- on ASA/Plavix75, Diovan160, HCT12.5, Lip80, Tricor145; he quit cardiac rehab maintenance 06/2016    Medical issues> followed by [facility]- Hx dysphagia/ stricture/ Barrett's (on Prilosec40), prostate and bladder problems (on Myrbetriq & Cialis), severe LBP & s/p lumbar lam (on Vicodin & Zanaflex & Cymbalta), dysthymia...  EXAM shows Afeb, postural BP changes noted, O2sat=95% on RA;  HEENT- dental issues, mallampati2;  Chest- congested cough w/ scat basilar rhonchi, decr BS bilat w/o w/r/consolidation;  Heart- RR  w/o m/r/g;  Abd- soft, nontender, neg;  Ext- w/o c/c/e.   LABS 08/04/17>  Chems-  Na=132, Cr=1.6;  CBC- Hg=12.6, WBC=8.4;  Sed=29  Low-dose screening CT Chest 08/07/17>  Norm heart size, aortic & coronary calcif, no adenopathy, lungs w/ centrilob & paraseptal emphysema plus peribronch thickening, scat tiny nodules, many calcif c/w granulomas, no suspicious nodules or masses, no focal disease, stable appearance of hepatic & renal cysts... IMP/PLAN>>  As prev discussed on numerous occas- he needs to quit all smoking but is not motivated & refuses smoking cessation help etc;  Continue Symbicort160-2spBid + AlbutHFA rescue inhaler, he refuses the addition of a LAMA to his regimen;  For the diarrhea, postural hypotension- avoid chinese food & MSG, take gatorade/ imodium/ align/ [place] and  f/u w/ PCP- [facility]...   ~  February 09, 2018:  33mo ROV & [facility] has had a very eventful 62mo interval w/ several hospitalizations & serious illnesses>  He is quite confused about these events and in-fact never filled the prescriptions given to him on disch from the nursing home 3 weeks ago, and presents w/ [facility], Budesonide, Atrovent for Nebulizer written on 12/02/17 but does not have a neb machine at home! We reviewed the following interval Epic-EMR notes avail to us>  HE TELLS ME HE ALSO SEES [facility] at the [facility] in [place]...    He was HOSP 1/13 - 09/18/17 by CARDS- DrSkains>  Hx HBP, CAD w/ mult interventions, Hx pericarditis, HL, & COPD w/ ongoing tobacco abuse & alcohol abuse; ADM w/ CP, abn EKG & elev troponins (NSTEMI, stress-induced) => cath revealed mod nonobstructive CAD (60-65% LAD & OM1/ OM2 lesions) no culprit lesion, EF=35-45% suggestive of Takotsubo cardiomyopathy;  2DEcho showed EF=30-35% w/ areas of AK & Gr1DD;  Medical management was initiated & adjusted- disch on Plavix, MetoprololER, Atorva;  HCT & ARB were held; fluid restricted to 1L/d; Etoh withdrawal rx w/ Librium taper, MVI, thiamine...    He was re-admitted 1/19 - 09/25/17 by [facility]>  Adm w/ confusion/ delirium, ?syncope, postural hypotension, dehydration, & AKI; given IVF, Midodrine, B12 shots started for borderline B12 level, & rec for outpt Neuro eval for dementia; PT rec disch to SNF for rehab...     He was re-admitted 3/22 - 12/03/17 by [facility]>  Acute resp failure due to HCAP requiring intubation & developed a loculated left pleural effusion/ empyema requiring L-VATS & decortication 11/25/17 (grew +strep viridans);  He had an ICU delirium, covered w/ antibiotics, seen by [facility] (bipolar- rec Cymbalta & Seroquel, and they indicated that he had the capacity to refuse SNF), required Tx for anemia; refused rehab disch & sent home w/ max home health support;     He returned to the ER 12/07/17 c/o weakness, constip, back  pain>  He had CT Head and CT Chest/Abd/Pelvis- chr changes, NAD;  He initially declined SNF placement for rehab but after consult w/ care management he agreed to go to [facility] => he was attended by [facility] while in [place];  Note from DrHopper 12/09/17 reviewed;  "He went home with son on 12/30/17 ([place]) thinking he was discharged. Police were called to report him missing from the facility. Police found him watching tv while sitting on his couch at home. He drove back to facility the next Bo. He continued rehabilitation at the facility";  Note from NP-Medina-Vargas on 01/14/18 reviewed (disch note)-- disch w/ home health Ot/ PT/ nursing services, & APs for DME;  Disch on NEB  rx w/ Albut, Ipratropium, Budesonide, Symbicort & Mucinex (but he did not have a home nebulizer!);  Numerous meds written but pt never filled these prescriptions at disch-- home health nursing apparently never picked up on these deficiencies during their home assessments...     He was seen by Hyman Bible on 01/20/18>  S/p left empyema w/ VATS, mini-thoracotomy, decortication on 11/24/17 by DrGerhardt; pt reported weakness, but denied f/c/s, sputum, etc; CXR showed persistent density loculated along the posterior chest wall of the left hemithorax, no new abn seen...     He reports that he had PCP follow up visit w/ [facility] 3 wks ago>  We do not have access to his medical records...  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    S/P HCAP w/ acute resp failure requiring intubation, & left empyema requiring VATS, mini-thoracotomy &  decortication on 11/24/17 by DrGerhardt...    Pulm nodules on CT Chest> Yearly f/u low-dose screening CT done 08/2017- norm heart size, aortic & coronary calcif, no adenopathy, lungs w/ centrilob & paraseptal emphysema plus peribronch thickening, scat tiny nodules, many calcif c/w granulomas, no suspicious nodules or masses, no focal disease, stable appearance of hepatic & renal cysts.    Cigarette smoker>  Still smoking 1-1.5ppd (even after his mult hosp & emphyema surg) and even has a pack rolled up in his left shoulder sleeve during today's office visit!    Hx cough syncope> past hx syncope w/ severe coughing paroxysms while on an ACE inhibitor- aware    Cardiac issues> HBP, CAD- s/p mult interventions, episode Takotsubo cardiomyopathy 09/2017, HL- followed by [facility]/ DrSkains (no notes in Epic), pt reports last seen 09/2016 & cleared for back surg w/ neg nuclear study- on ASA/Plavix75, Diovan160, HCT12.5, Lip80, Tricor145; he quit cardiac rehab maintenance 06/2016; mult med changes in hosp spring 2019 but what was he discharged on from [facility] rehab stay?    Medical issues> followed by [facility]- Hx dysphagia/ stricture/ Barrett's (on Prilosec40), prostate and bladder problems (on Myrbetriq & Cialis), severe LBP & s/p lumbar lam (on Vicodin & Zanaflex & Cymbalta), dysthymia/ ?bipolar w/ hx delirium, ?dementia?;  Disch from [facility] on Cymbalta30- 3/d and Seroquel25mg  Qhs... EXAM shows thin, weak, Afeb, BP=154/60, O2sat=98% on RA;  Wt=115#, BMI= 19;  HEENT- dental issues, mallampati2;  Chest- congested cough w/ scat basilar rhonchi, decr BS left base w/o w/r/consolidation;  Heart- RR w/o m/r/g;  Abd- soft, nontender, neg;  Ext- w/o c/c/e.   Last CXR 01/20/18>   showed persistent density loculated along the posterior chest wall of the left hemithorax, no new abn seen...   Ambulatory Oximetry 02/09/18>  O2sat=97% on RA at rest w/ pulse=69/min;  He walked just one lap (185') w/ lowest O2sat=93% on RA  w/ pulse=110/min...  IMP/PLAN>>  WOW- Derrell has really been thru it!  From the pulmonary perspective- 1) he needs to quit all smoking, and again declines all smoking cessation help!  2) he needs a NEBULIZER & directed to use it w/ the BROVANA 55mcg/2ml Bid + BUDESONIDE 0.5mg  Bid;  3) continue the SYMBICORT160-2spBid;  4) continue MUCINEX600-2 po  bid w/ fluids... He is rec to f/u w/ CARDS ASAP and maintain general medical f/u w/ Brad Turner regularly for his medical issues including: back pain, wekness, anemia, psyche, etc... We plan ROV recheck w/ CXR & labs in 1mo...   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 We reviewed the following interval Epic-EMR notes avail to us>  HE TELLS ME HE ALSO SEES Brad Turner at the [facility] in [place]...    He saw CARDS- Brad Turner 02/10/18>  HBP, CAD- s/p PCI, Takotsubo syndrome, chr sys CHF, PVCs, Hx TIA, HL; on ASA81, Plavix75, MetropER50, Lasix20, Lip80, no longer taking Midodrine & it's hard to know what he is actually taking; back to his home & living alone- using a walker, still drinking, still smoking; refused home health care; rec to f/u in 25mo...    He tells me that he saw PCP- Brad Turner 2d ago and no changes were made to his medication regimen (we do not have access to notes to review)...    He reports that he fell in his garage yest & bruised up his right arm- did not call PCP, did not go to the ER or UC... We reviewed the following medical problems during today's office visit>      COPD, emphysema> on NEB w/ Brovana Bid now, Symbicort160-2spBid, ProventilHFA & Mucinex600-2Bid prn; he has repeatedly refused LAMA meds in the past; c/o some cough,  congestion, small amt clear sputum, no hemoptysis; he notes some SOB w/ walking but still plays his trumpet he says...    S/P HCAP w/ acute resp failure requiring intubation, & left empyema requiring VATS, mini-thoracotomy & decortication on 11/24/17 by DrGerhardt>  Epic records reviewed...    Pulm nodules on CT Chest> Yearly f/u low-dose screening CT done 08/2017- norm heart size, aortic & coronary calcif, no adenopathy, lungs w/ centrilob & paraseptal emphysema plus peribronch thickening, scat tiny nodules, many calcif c/w granulomas, no suspicious nodules or masses, no focal disease, stable appearance of hepatic & renal cysts.    Cigarette smoker>  Still smoking 1.5ppd (even after his mult hosp & emphyema surg) and even had a pack rolled up in his left shoulder sleeve during recentoffice visit!    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    Medical issues> followed by Brad Turner- Hx dysphagia/ stricture/ Barrett's (on Prilosec40), prostate and bladder problems (on Myrbetriq & Cialis), severe LBP & s/p lumbar lam (on Vicodin & Zanaflex & Cymbalta), dysthymia/ ?bipolar w/ hx delirium, ?dementia?;  Disch from [place] on Cymbalta30- 3/d and Seroquel25mg  Qhs... EXAM shows thin, weak, Afeb, BP=110/66, O2sat=99% on RA;  Wt=112#, BMI= 19;  HEENT- dental issues, mallampati2;  Chest- congested cough w/  scat basilar rhonchi, decr BS left base w/o w/r/consolidation;  Heart- RR w/o m/r/g;  Abd- soft, nontender, neg;  Ext- skin tear  on arm from fall, w/o c/c/e.   LABS 03/02/18 from [facility] office>  [place]- Na=133, K=3.9, HCO#=24, Cr=0.8, BS-99, LFTs=wnl x AlkPhos-154 & Alb-3,0;  CBC- ok w/ Hg=13.2, WBC=8.6  CXR 03/11/18>  Norm heart size, underlying COPD/ emphysema, clear right lung, post loculated left pleural effusion is smaller compared to prev films... IMP/PLAN>>   Brad Turner is encouraged to cut back & quit the smoking & drinking, continue the Brovana NEBS Bid, Symbicort- 2spBid, Mucinex600-2tabs Bid regularly;  We cleaned & dressed his arm skin tear & referred him to the [facility] for management;  He will maintain follow up w/ PCP-Brad Turner, CARDS- Brad Turner, & I have asked him to return in 57mo w/ all his med bottles for [place] to review...    Past Medical History:  Diagnosis Date  . Alcohol withdrawal ([place])   . Allergy    seasonal  . Arthritis   . Barrett esophagus   . BPH (benign prostatic hyperplasia)   . CAD (coronary artery disease)    a. s/p multiple POBA last in 2011 to distal OM2. b. Takotsubo event 09/2017 with cath showing no culprit, 25% prox LAD, 60% OM1, 65% OM2, EF 35-40% by cath and 30-35% by echo.  . Chronic back pain   . COPD (chronic obstructive pulmonary disease) (HCC)    Albuterol inhaler as needed.Uses Symbicort daily  . Depression   . Emphysema lung ([place])   . Fall    fell on 10/06/16 with loss of consiousness for a short time.States he remembers getting up but remembers nothing else.  . Gastric ulcer   . Gastritis   . GERD (gastroesophageal reflux disease)    takes Omeprazole daily  . History of colon polyps    benign  . History of kidney stones   . Hyperlipidemia   . Hypertension   . Hyponatremia   . 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 Neuropathy   . Nocturia   . Pneumonia    hx of  . PONV (postoperative nausea and vomiting)   . Skin cancer   . Stroke [facility]) 1995   Affected balance; and has very emotional if watching a movie  .  Syncope    yr ago  . Takotsubo cardiomyopathy    a. 09/2017 - EF 35-40% by cath, 30-35% by echo.  . Urgency of urination    takes Myrbetriq daily    Past Surgical History:  Procedure Laterality Date  . APPENDECTOMY    . CARDIAC CATHETERIZATION     x 4. Most recent one in 2011 at [facility](3 previous were done 20 yrs before)  . cataracts Bilateral   . CHEST EXPLORATION Left 11/24/2017   Procedure: CHEST EXPLORATION;  Surgeon: [name], MD;  Location: [place];  Service: Thoracic;  Laterality: Left;  . COLONOSCOPY    . CYSTOSCOPY  07/21/2013   Dr. Jeffie Pollock  . DENTAL SURGERY     with sedation  . ESOPHAGEAL MANOMETRY N/A 09/20/2015   Procedure: ESOPHAGEAL MANOMETRY (EM);  Surgeon: Manus Gunning, MD;  Location: [facility];  Service: Gastroenterology;  Laterality: N/A;  . LEFT HEART CATH AND CORONARY ANGIOGRAPHY N/A 09/15/2017   Procedure: LEFT HEART CATH AND CORONARY ANGIOGRAPHY;  Surgeon: Martinique, Peter M, MD;  Location: [facility];  Service: Cardiovascular;  Laterality: N/A;  .  LUMBAR LAMINECTOMY/DECOMPRESSION MICRODISCECTOMY N/A 01/12/2015   Procedure: LUMBAR LAMINECTOMY/DECOMPRESSION MICRODISCECTOMY 3 LEVELS;  Surgeon: Phylliss Bob, MD;  Location: [place];  Service: Orthopedics;  Laterality: N/A;  Lumbar 3-4, lumbar 4-5, lumbar 5-sacrum 1 decompression  . parathyroid adenoma removed    . POLYPECTOMY    . ROTATOR CUFF REPAIR     bilateral  . TONSILLECTOMY    . UPPER GASTROINTESTINAL ENDOSCOPY    . VIDEO ASSISTED THORACOSCOPY (VATS)/EMPYEMA Left 11/24/2017   Procedure: VIDEO ASSISTED THORACOSCOPY (VATS)/EMPYEMA;  Surgeon: [name], MD;  Location: [place];  Service: Thoracic;  Laterality: Left;  Marland Kitchen VIDEO BRONCHOSCOPY N/A 11/24/2017   Procedure: VIDEO BRONCHOSCOPY;  Surgeon: [name], MD;  Location: [place];  Service: Thoracic;  Laterality: N/A;    Outpatient Encounter Medications as of 03/11/2018  Medication Sig  . acetaminophen (TYLENOL) 650 MG CR tablet  Take 650 mg by mouth every 8 (eight) hours.   Marland Kitchen albuterol (PROVENTIL HFA;VENTOLIN HFA) 108 (90 BASE) MCG/ACT inhaler Inhale 2 puffs into the lungs every 6 (six) hours as needed for wheezing or shortness of breath. Reported on 11/29/2015  . arformoterol (BROVANA) 15 MCG/2ML NEBU Take 2 mLs (15 mcg total) by nebulization 2 (two) times daily.  Marland Kitchen atorvastatin (LIPITOR) 80 MG tablet Take 80 mg by mouth daily.   . clopidogrel (PLAVIX) 75 MG tablet Take 75 mg by mouth daily.  . DULoxetine (CYMBALTA) 30 MG capsule Take 30 mg by mouth daily.  . folic acid (FOLVITE) 1 MG tablet Take 1 tablet (1 mg total) by mouth daily.  Marland Kitchen guaiFENesin (MUCINEX) 600 MG 12 hr tablet Take 600 mg by mouth 2 (two) times daily as needed for cough or to loosen phlegm.   . metoprolol succinate (TOPROL-XL) 50 MG 24 hr tablet Take 1 tablet (50 mg total) by mouth daily. Take with or immediately following a meal.  . MYRBETRIQ 50 MG TB24 tablet Take 50 mg by mouth daily.   Marland Kitchen NITROSTAT 0.4 MG SL tablet Place 0.4 mg under the tongue every 5 (five) minutes as needed for chest pain. Reported on 11/29/2015  . Nutritional Supplements (NUTRITIONAL SUPPLEMENT PO) Take 1 each by mouth 2 (two) times daily. Health Shakes  . omeprazole (PRILOSEC) 40 MG capsule TAKE ONE CAPSULE BY MOUTH TWICE DAILY  . SYMBICORT 160-4.5 MCG/ACT inhaler INHALE TWO PUFFS INTO LUNGS TWICE DAILY  . [DISCONTINUED] budesonide (PULMICORT) 0.5 MG/2ML nebulizer solution Take 2 mLs (0.5 mg total) by nebulization 2 (two) times daily.  . [DISCONTINUED] budesonide (PULMICORT) 0.5 MG/2ML nebulizer solution Take 2 mLs (0.5 mg total) by nebulization 2 (two) times daily.  . [DISCONTINUED] DULoxetine (CYMBALTA) 30 MG capsule Take 3 capsules (90 mg total) by mouth daily. (Patient taking differently: Take 30 mg by mouth daily. )  . [DISCONTINUED] fenofibrate (TRICOR) 145 MG tablet Take 145 mg by mouth daily.   . [DISCONTINUED] ipratropium (ATROVENT) 0.02 % nebulizer solution Take 2.5 mLs  (0.5 mg total) by nebulization every 6 (six) hours as needed for wheezing or shortness of breath.  . [DISCONTINUED] loperamide (IMODIUM) 2 MG capsule Take 2 mg by mouth as needed for diarrhea or loose stools.  . [DISCONTINUED] Multiple Vitamins-Minerals (MULTIVITAMIN WITH MINERALS) tablet Take 1 tablet by mouth daily.  . [DISCONTINUED] NUTRITIONAL SUPPLEMENT LIQD Take 120 mLs by mouth. MedPass  . [DISCONTINUED] QUEtiapine (SEROQUEL) 25 MG tablet Take 1 tablet (25 mg total) by mouth at bedtime.  . [DISCONTINUED] thiamine 100 MG tablet Take 1 tablet (100 mg total) by mouth daily.  . [  DISCONTINUED] vitamin B-12 (CYANOCOBALAMIN) 1000 MCG tablet Take 1 tablet (1,000 mcg total) by mouth daily. (Patient not taking: Reported on 03/11/2018)   No facility-administered encounter medications on file as of 03/11/2018.      Allergies  Allergen Reactions  . Ivp Dye [Iodinated Diagnostic Agents] Hives and Shortness Of Breath    reaction was when pt was 77 years old.  Clancy Gourd [Nitrofurantoin] Other (See Comments)    Fever that lasted several months    Immunization History  Administered Date(s) Administered  . Influenza Split 05/14/2012  . Influenza Whole 06/02/2010, 05/09/2011  . Influenza, High Dose Seasonal PF 06/17/2016, 06/04/2017  . Influenza,inj,Quad PF,6+ Mos 06/02/2013, 05/24/2014  . Influenza-Unspecified 05/31/2015  . Pneumococcal Conjugate-13 09/02/2014  . Pneumococcal Polysaccharide-23 06/02/2009  . Tdap 10/29/2014  HE WILL CHECK w/ Brad Turner regarding PREVNAR-13 vaccination...   Current Medications, Allergies, Past Medical History, Past Surgical History, Family History, and Social History were reviewed in [employer] record.   Review of Systems        All symptoms NEG except where BOLDED >>  Constitutional:  F/C/S, fatigue, anorexia, unexpected weight change. HEENT:  HA, visual changes, hearing loss, earache, nasal symptoms, sore throat, mouth sores,  hoarseness. Resp:  cough, sputum, hemoptysis; SOB, tightness, wheezing. Cardio:  CP, palpit, DOE, orthopnea, edema. GI:  N/V/D/C, blood in stool; reflux, abd pain, distention, gas. GU:  dysuria, freq, urgency, hematuria, flank pain, voiding difficulty. MS:  joint pain, swelling, tenderness, decr ROM; neck pain, back pain, etc. Neuro:  HA, tremors, seizures, dizziness, syncope, weakness, numbness, gait abn. Skin:  suspicious lesions or skin rash. Heme:  adenopathy, bruising, bleeding. Psyche:  confusion, agitation, sleep disturbance, hallucinations, anxiety, depression suicidal.     Objective:   Physical Exam    Vital Signs:  Reviewed...   General:  WD, WN, 77 y/o WM chr ill appearing; alert & oriented; pleasant & cooperative... HEENT:  Forehead laceration, bilat black eyes; Conjunctiva- red, Sclera- nonicteric, EOM-wnl, PERRLA; EACs-clear, TMs-wnl; NOSE-clear; THROAT- sl red Neck:  Supple w/ decr ROM; no JVD; normal carotid impulses w/o bruits; no thyromegaly or nodules palpated; no lymphadenopathy.  Chest:  Decr BS bilat w/ few scat rhonchi at bases, congested cough, no wheezing/ rales/ or consolidation... Heart:  Regular Rhythm; norm S1 & S2 without murmurs, rubs, or gallops detected. Abdomen:  Soft & nontender- no guarding or rebound; normal bowel sounds; no organomegaly or masses palpated. Ext:  Decr ROM; without deformities, +arthritic changes; no varicose veins, venous insuffic, or edema;  Pulses intact w/o bruits. Neuro:  CNs II-XII intact; motor testing normal; sensory testing normal; +gait abn, sl leg weakness, balance is off... Derm:  No lesions noted x bilat ecchymoses periorbitally... Lymph:  No cervical, supraclavicular, axillary, or inguinal adenopathy palpated.    Assessment & Plan:    IMP >>     COPD> on Symbicort160-2spBid, ProventilHFA prn; he never filled the Incruse or Spiriva ($$); rec to add GFN400-2Qid + fluids...    S/P HCAP w/ acute resp failure requiring  intubation, & left empyema requiring VATS, mini-thoracotomy & decortication on 11/24/17 by DrGerhardt...    Pulm nodules on CT Chest> Yearly f/u low-dose screening CT done 06/2016- mult tiny calcif nodules c/w old granulomatous dis + 4-50mm LUL nodule- stable (see report)...    Cigarette smoker>  Still smoking 1.5ppd and he has smoked 1-1.5ppd for ~60 yrs; he declines smoking cessation help & is not motivated to quit...    Hx cough syncope> this occurred remotely w/  severe coughing paroxysm while he was on an ACE inhib...    CARDIAC issues> HBP, CAD, HL- followed by Brad Turner (no notes in Epic)- on ASA/Plavix75, Diovan160, HCT12.5, Lip80, Tricor145...    MEDICAL ISSUES>        Dysphagia, hx esoph stricture & Barrett's esoph> on Prilosec40; eval by Brad Turner in past w/ hx dilatation in the past, now followed by Brad Turner...       GU issues> on Myrbetriq50, Cialis5       Ortho- LBP w/ lumbar lam 2016 by Brad Turner who now plans further extensive surg...       Anxiety/depression> prev on WellbutrinXL150; his son is treated for depression, friend died w/ metastatic lung cancer  PLAN >>   06/07/15>   [place] & I discussed his continued smoking & he is not motivated to quit- offered meds and he will call if he changes his mind;  Rec to continue Symbicort160-2spBid, he was tried on Spiriva respimat in past but wouldn't fill it due to cost- I pointed out that if he'd quit smoking he could get the Spiriva & noted that his breathing would be better due to both things! Rec to add Mucinex 2400mg /d.Marland KitchenMarland KitchenWe plan ROV 62months. 12/07/15>   Emileo is unable to quit smoking and has mod COPD w/ emphysema on his CTscans- we will add INCRUSE one inhalation daily; he knows to avoid infections & be sure he is up to date on all vaccinations from Brad Turner. 06/10/16>   Despite his continued smoking 1.5ppd Brad Turner FEV1 has improved 1.84L=> 2.00L (~10% improvement over the past yr) a likely benefit from the Symbicort, he never filled  the Incruse; says he is more lim by his back- Brad Turner says too hi risk for more surg, & they may try shorts; he knows that he needs to quit the smoking but not motivated to do so despite the risk to his music; same Rx & we plan ROV 44mo 10/09/16>   Brad Turner's pulmonary situation remains the same- mixed COPD w/ emphysematous and chr bronchitic components, still smoking 1.5ppd, using his Symbicort (but I suspect not regularly), refuses addition of a LAMA med that would be additionally beneficial in the long run;  He improved after Zithromax & Pred pack per Brad Turner recently & we will place him on a sl longer course of oral PRED (see AVS), plus maximize mucolytic therapy w/ GFN400-2Qid + fluids;  His recent ?syncopal spell does not appear to be cough related- perhaps a fall from leg weakness, head trauma w/ concussion & retrograde amnesia for the fall- in any event he did not go to the ER & is in need of concussion eval/ CT vs MRI head (he is on ASA/Plavix)/ etc; pt has an ASAP appt w/ his PCP in the AM... I am concerned regarding his surgical risk. 11/28/16>   [place] is rec to add a nutritional supplements to his regimen, try Astelin NS for the runny nose, and continue the [employer];  But most importantly he must committ to smoking cessation!! Continue same meds 03/03/17>   [place] is again encouraged to cut back & quit smoking- options for medications, nicotine replacement, counseling, hypnosis, etc discussed but he is not interested;  Continue Symbicort & Ventolin, add GFN as discussed and do the best he can w/ exercise. 08/04/17>   As prev discussed on numerous occais- he needs to quit all smoking but is not motivated & refuses smoking cessation help etc;  Continue Symbicort160-2spBid + AlbutHFA rescue inhaler, he refuses the addition of a  LAMA to his regimen;  For the diarrhea, postural hypotension- avoid chinese food & MSG, take gatorade/ imodium/ align/ [place] and f/u w/ PCP- Brad Turner... 02/09/18>    WOW- Thomson has really been thru it!  From the pulmonary perspective- 1) he needs to quit all smoking, and again declines all smoking cessation help!  2) he needs a NEBULIZER & directed to use it w/ the BROVANA 42mcg/2ml Bid + BUDESONIDE 0.5mg  Bid;  3) continue the SYMBICORT160-2spBid;  4) continue MUCINEX600-2 po bid w/ fluids... He is rec to f/u w/ CARDS ASAP and maintain general medical f/u w/ Brad Turner regularly for his medical issues including: back pain, wekness, anemia, psyche, etc... We plan ROV recheck w/ CXR & labs in 75mo... 03/11/18>   Brad Turner is encouraged to cut back & quit the smoking & drinking, continue the Brovana NEBS Bid, Symbicort- 2spBid, Mucinex600-2tabs Bid regularly;  We cleaned & dressed his arm skin tear & referred him to the [facility] for management;  He will maintain follow up w/ PCP-Brad Turner, CARDS- Brad Turner, & I have asked him to return in 56mo w/ all his med bottles for [place] to review.   Patient's Medications  New Prescriptions   No medications on file  Previous Medications   ACETAMINOPHEN (TYLENOL) 650 MG CR TABLET    Take 650 mg by mouth every 8 (eight) hours.    ALBUTEROL (PROVENTIL HFA;VENTOLIN HFA) 108 (90 BASE) MCG/ACT INHALER    Inhale 2 puffs into the lungs every 6 (six) hours as needed for wheezing or shortness of breath. Reported on 11/29/2015   ARFORMOTEROL (BROVANA) 15 MCG/2ML NEBU    Take 2 mLs (15 mcg total) by nebulization 2 (two) times daily.   ATORVASTATIN (LIPITOR) 80 MG TABLET    Take 80 mg by mouth daily.    CLOPIDOGREL (PLAVIX) 75 MG TABLET    Take 75 mg by mouth daily.   DULOXETINE (CYMBALTA) 30 MG CAPSULE    Take 30 mg by mouth daily.   FOLIC ACID (FOLVITE) 1 MG TABLET    Take 1 tablet (1 mg total) by mouth daily.   GUAIFENESIN (MUCINEX) 600 MG 12 HR TABLET    Take 600 mg by mouth 2 (two) times daily as needed for cough or to loosen phlegm.    METOPROLOL SUCCINATE (TOPROL-XL) 50 MG 24 HR TABLET    Take 1 tablet (50 mg total) by mouth daily. Take with  or immediately following a meal.   MYRBETRIQ 50 MG TB24 TABLET    Take 50 mg by mouth daily.    NITROSTAT 0.4 MG SL TABLET    Place 0.4 mg under the tongue every 5 (five) minutes as needed for chest pain. Reported on 11/29/2015   NUTRITIONAL SUPPLEMENTS (NUTRITIONAL SUPPLEMENT PO)    Take 1 each by mouth 2 (two) times daily. Health Shakes   OMEPRAZOLE (PRILOSEC) 40 MG CAPSULE    TAKE ONE CAPSULE BY MOUTH TWICE DAILY   SYMBICORT 160-4.5 MCG/ACT INHALER    INHALE TWO PUFFS INTO LUNGS TWICE DAILY  Modified Medications   No medications on file  Discontinued Medications   BUDESONIDE (PULMICORT) 0.5 MG/2ML NEBULIZER SOLUTION    Take 2 mLs (0.5 mg total) by nebulization 2 (two) times daily.   BUDESONIDE (PULMICORT) 0.5 MG/2ML NEBULIZER SOLUTION    Take 2 mLs (0.5 mg total) by nebulization 2 (two) times daily.   DULOXETINE (CYMBALTA) 30 MG CAPSULE    Take 3 capsules (90 mg total) by mouth daily.   FENOFIBRATE (TRICOR) 145  MG TABLET    Take 145 mg by mouth daily.    IPRATROPIUM (ATROVENT) 0.02 % NEBULIZER SOLUTION    Take 2.5 mLs (0.5 mg total) by nebulization every 6 (six) hours as needed for wheezing or shortness of breath.   LOPERAMIDE (IMODIUM) 2 MG CAPSULE    Take 2 mg by mouth as needed for diarrhea or loose stools.   MULTIPLE VITAMINS-MINERALS (MULTIVITAMIN WITH MINERALS) TABLET    Take 1 tablet by mouth daily.   NUTRITIONAL SUPPLEMENT LIQD    Take 120 mLs by mouth. MedPass   QUETIAPINE (SEROQUEL) 25 MG TABLET    Take 1 tablet (25 mg total) by mouth at bedtime.   THIAMINE 100 MG TABLET    Take 1 tablet (100 mg total) by mouth daily.   VITAMIN B-12 (CYANOCOBALAMIN) 1000 MCG TABLET    Take 1 tablet (1,000 mcg total) by mouth daily.

## 2018-04-15 ENCOUNTER — Encounter: Payer: Self-pay | Admitting: Pulmonary Disease

## 2018-04-15 ENCOUNTER — Ambulatory Visit: Payer: Medicare Other | Admitting: Pulmonary Disease

## 2018-04-15 VITALS — BP 132/74 | HR 61 | Temp 97.5°F | Ht 65.0 in | Wt 113.0 lb

## 2018-04-15 DIAGNOSIS — J432 Centrilobular emphysema: Secondary | ICD-10-CM | POA: Diagnosis not present

## 2018-04-15 DIAGNOSIS — Z720 Tobacco use: Secondary | ICD-10-CM | POA: Diagnosis not present

## 2018-04-15 DIAGNOSIS — J869 Pyothorax without fistula: Secondary | ICD-10-CM

## 2018-04-15 DIAGNOSIS — F1010 Alcohol abuse, uncomplicated: Secondary | ICD-10-CM

## 2018-04-15 DIAGNOSIS — R627 Adult failure to thrive: Secondary | ICD-10-CM

## 2018-04-15 DIAGNOSIS — J189 Pneumonia, unspecified organism: Secondary | ICD-10-CM | POA: Diagnosis not present

## 2018-04-15 DIAGNOSIS — J918 Pleural effusion in other conditions classified elsewhere: Secondary | ICD-10-CM

## 2018-04-15 NOTE — Progress Notes (Signed)
Subjective:    Patient ID: Brad Turner, male    DOB: May 08, 1941, 77 y.o.   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    CT Chest 09/17/13>  Clearing of prior RLL opac, 3 small nodules on the left- unchanged from 2011, severe emphysematous change, pos coronary calcif  Spirometry 11/22/13 at [facility]> FVC=3.26 (89%), FEV1=1.84 (69%), %1sec=56;  This is c/w a moderate obstructive ventilatory defect  V/Q Lung Scan 10/29/14>  Patchy nonsegmental ventilation defects, patchy scattered nonsegmental perfusion defects, felt to be a low prob scan...  CXR 10/29/14>  Heart is upper lim of norm, hyperinflated/ emphysematous lungs- NAD, DJD spine...  10/2014> he saw DrClance w/ classic description of cough syncope believed related to ACE inhibitor therapy  01/12/15> s/p lumbar laminectomy by DrDumonski   ~  June 07, 2015:  30mo ROV w/ SN>  He was the Actuary at [employer] in the past; his PCP is Training and development officer...    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     Cigarette smoker>  Still smoking 1.5ppd and he has smoked 1-1.5ppd for ~60 yrs; he declines smoking cessation help & is not motivated to quit...    Hx cough syncope> aware    CARDIAC issues> HBP, CAD, HL- followed by DrTilley (no notes in Epic)- on Plavix75, Amlod5-1/2, Diovan80, Lip80, [phone]; he remains in cardiac rehab regularly...    Dysphagia, hx esoph stricture & Barrett's esoph> on Dexilant60; eval by DrKaplan w/ hx dilatation in the past    GU issues> on Myrbetriq50, Cialis5    Ortho- LBP w/ lumbar lam 2016 by DrDumonski    Anxiety/depression> on WellbutrinXL150; his  son is treated for depression, friend died w/ metastatic lung cancer EXAM shows Afeb, VSS, O2sat=94% on RA;  HEENT- dental issues, mallampati2;  Chest- decr BS bilat w/o w/r/r;  Heart- RR w/o m/r/g;  Abd- soft, nontender, neg;  Ext- w/o c/c/e;  Neuro- intact...  Low dose screening CT Chest10/14/16>  Norm heart size, coronary & aortic calcif, 60mm noncalcif nodule in LUL, scat calcif granulomas up to 2.81mm; mod centrilobular & paraseptal emphysema, abd is ok w/ 2cm cyst in liver...  IMP/PLAN>>  Brad Turner & I discussed his continued smoking & he is not motivated to quit- offered meds and he will call if he changes his mind;  Rec to continue Symbicort160-2spBid, he was tried on Spiriva respimat in past but wouldn't fill it due to cost- I pointed out that if he'd quit smoking he could get the Spiriva & noted that his breathing would be better due to both things! Rec to add Mucinex 2400mg /d.Marland KitchenMarland KitchenWe plan ROV 2months.  ~  December 07, 2015:  59mo ROV w/ SN>  PCP- DrTisovec, Cards- DrTilley, GI- DrArmbruster...    Brad Turner has had some recent dental work w/ mult teeth pulled; he remains in cardiac rehab maintenance program and doing well; no new complaints or concerns; he is using his Symbicort160-2spBid, not using the Mucinex; he has chr stable DOE, min cough, small amt beige sput, no CP/ palpit/ edema...    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     Cigarette smoker>  Still smoking 1.5ppd and he has smoked 1-1.5ppd for ~60 yrs; he declines smoking cessation help & is not motivated to quit...    Hx cough syncope> aware    CARDIAC issues> HBP, CAD, HL- followed by DrTilley (no notes in Epic)- on Plavix75, Amlod5-1/2, Diovan80, Lip80, [phone]; he remains in cardiac rehab regularly... EXAM shows Afeb, VSS, O2sat=92% on RA;  HEENT- dental issues, mallampati2;  Chest- decr BS  bilat w/o w/r/r;  Heart- RR w/o m/r/g;  Abd- soft, nontender, neg;  Ext- w/o c/c/e;  Neuro- intact... IMP/PLAN>>  Brad Turner is unable to quit smoking and has mod COPD w/ emphysema on his CTscans- we will add INCRUSE one inhalation daily; he knows to avoid infections & be sure he is up to date on all vaccinations from DrTisovec... we plan ROV 56mo at which time he will need his next screening CT Chest...  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 He had a recent trip to [place] & did ok w/ walking & breathing but had some back discomfort that was more limiting; he saw DrDumonski recently w/ talk of rods and screws "but I'm too high risk" and they are considering trial of nerve blocks... Of interest he noted that his 19 y/o son (adopted) had a baby daughter recently Dx w/ CF... 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    Cigarette smoker>  Still smoking 1.5ppd and he has smoked 1-1.5ppd for ~60 yrs; he declines smoking cessation help & is not motivated to quit...    Hx cough syncope> past hx syncope w/ severe coughing  paroxysms- aware    Cardiac issues> HBP, CAD, HL- followed by DrTilley (no notes in Epic, pt reports last seen 02/2016)- on Plavix75, Amlod5-1/2, Diovan80, Lip80, Tricor145; he remains in cardiac rehab regularly...    Medical issues> followed by DrTisovec- Hx dysphagia/ stricture/ Barrett's, LBP- s/p lumbar lam, anxiety/depression EXAM shows Afeb, VSS, O2sat=98% on RA;  HEENT- dental issues, mallampati2;  Chest- decr BS bilat w/o w/r/r;  Heart- RR w/o m/r/g;  Abd- soft, nontender, neg;  Ext- w/o c/c/e;  Neuro- intact...  CXR 06/10/16>  Norm heart size, atherosclerotic Ao, no adenopathy, stable 24mm opac in LUL is noted- NAD, old trauma to left clavicle...   Low-dose screening CT Chest 06/18/16>  Norm heart size w/ atherosclerotic changes in Ao, great vessels, & coronaries; No adenopathy; Mult tiny pulm nodules bilat, most are calcified & c/w granulomas; Largest non-calcif nodule is 4-73mm  in LUL & no change from 64yr ago- no new lesions; Diffuse bronchial wall thickening, & mod centrilobular & paraseptal emphysema; Small liver cyst & small bilat renal cysts noted...  Spirometry 06/10/16>  FVC=4.10 (112%), FEV1=2.00 (76%), %1sec=49%, mid-flows reduced at 32% predicted...  IMP/PLAN>>  Despite his continued smoking 1.5ppd Brad Turner's FEV1 has improved 1.84L=> 2.00L (~10% improvement over the past yr) a likely benefit from the Symbicort, he never filled the Incruse but would benefit from both meds; says he is more lim by his back- DrDumonski says too hi risk for more surg, & they may try shots; he knows that he needs to quit the smoking but not motivated to do so despite the risk to his music; same Rx & we plan ROV 52mo...  ~  October 09, 2016:  106mo ROV & add-on appt requested for syncopal episode>  Brad Turner describes a mild COPD exac recently and ~10d ago he was seen by PA in DrTisovec's office & given ZPak & Pred pack; he also states that he hasn't been sleeping well at night due to back pain & c/o urinary  frequency (having to get up about 10x per night which he thinks is related to Lyrica making his ankles swell & his HCT diuretic); then about 4 nights ago he got up to urinate and doesn't remember what happened next but he appears to have had a syncopal episode & awoke a short time late on the floor of his bedroom having lacerated his forehead & bled on the carpet, plus 2 black eyes; he had a remote hx of cough syncope & wondered if this might have occurred again but clearly this episode was very different than previous (ie- previously he had a severe coughing paroxysm that lead to syncope from the valsalva, and this episode did not involve coughing paroxysm in fact he noted that legs were very weak due to his spinal condition & sounds like he fell- hit his head w/ prob concussion- and awoke a short time later w/ amnesia for the actual fall); Note- he did not call 911 or go to the ER or urgent care facility; he called DrTisovec's office and was referred here for pulmonary eval due to his remote hx of cough syncope... NOTE- DrDumonski plans back surg in ~10 days! 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    Cigarette smoker>  Still smoking 1.5ppd and he has smoked 1-1.5ppd for ~60 yrs; he declines  smoking cessation help & is not motivated to quit despite knowing the risks...    Hx cough syncope> past hx syncope w/ severe coughing paroxysms while on an ACE inhibitor- aware    Cardiac issues> HBP, CAD, HL- followed by DrTilley (no notes in Epic, pt reports last seen 09/2016 &  cleared for back surg w/ neg nuclear study)- on ASA/Plavix75, Diovan160, HCT12.5, Lip80, Tricor145; he quit cardiac rehab maintenance 06/2016    Medical issues> followed by DrTisovec- Hx dysphagia/ stricture/ Barrett's (on Prilosec40), prostate and bladder problems (on Myrbetriq & Cialis), severe LBP & s/p lumbar lam (on Vicodin & Lyrica), anxiety/depression EXAM shows Afeb, VSS, O2sat=98% on RA;  HEENT- dental issues, mallampati2;  Chest- congested cough w/ scat basilar rhonchi, decr BS bilat w/o w/r/consolidation;  Heart- RR w/o m/r/g;  Abd- soft, nontender, neg;  Ext- w/o c/c/e.   CXR in [place] 08/02/16 (independently reviewed by me in the PACS system) showed norm heart size, atherosclerotic ao, clear lungs- NAD,   LABS in Epic 08/2016>  Chems showed hyponatremia w/ Na=132, & renal insuffic w/ BUN=38, Cr=2.04;  CBC is wnl w/ Hg=13.0.Marland KitchenMarland Kitchen  IMP/PLAN>>  Brad Turner's pulmonary situation remains the same- mixed COPD w/ emphysematous and chr bronchitic components, still smoking 1.5ppd, using his Symbicort (but I suspect not regularly), refuses addition of a LAMA med that would be additionally beneficial in the long run;  He improved after Zithromax & Pred pack per DrTisovec recently & we will place him on a sl longer course of oral PRED (see AVS), plus maximize mucolytic therapy w/ GFN400-2Qid + fluids;  His recent ?syncopal spell does not appear to be cough related- perhaps a fall from leg weakness, head trauma w/ concussion & retrograde amnesia for the fall- in any event he did not go to the ER & is in need of concussion eval/ CT vs MRI head (he is on ASA/Plavix)/ etc; pt has an ASAP appt w/ his PCP in the AM... I am concerned regarding his  surgical risk.  NOTE: >50% of this 25min visit was spent in counseling & coordination of care.  ~  November 28, 2016:  6wk ROV & post hosp check>  Brad Turner has a hx of prev back surgery & was [facility] 2/15 - 10/20/16 by Ortho- DrDumonski for Lumbar laminectomy & fusion (rods & screws) w/ allograft due to severe unremitting pain;  He underwent post-op rehab & improved (still using walker), then fell 11/03/16 & was seen in the ER- XRays were ok w/o acute changes, he continues to f/u w/ DrDumonski- on Percocet & muscle relaxers... He is under a lot of stress as well since his 75 y/o yellow lab "Festus Holts" is at the end of her life...    COPD,emphysema> on Symbicort160 & AlbutHFA as needed; he says his breathing is ok, notes some watery nasal drainage (try Astelin), min cough w/ min thick sput (no discoloration or blood)...     Pulm nodules> aware, see above...    Cigarette smoker> still smoking 1ppd, and can't vs won't quit... EXAM shows Afeb, VSS, O2sat=98% on RA;  He has lost 13# down to 147# today (not eating, no appetite);  HEENT- dental issues, mallampati2;  Chest- sl congested cough w/ scat basilar rhonchi, decr BS bilat w/o w/r/consolidation;  Heart- RR w/o m/r/g;  Abd- soft, nontender, neg;  Ext- w/o c/c/e.   LABS 12/02/16>  Chems- ok x BS=112;  CBC- ok w/ Hg=12.4;  TSH= 1.00 IMP/PLAN>>  Brad Turner is rec to add a nutritional supplements to his regimen, try Astelin NS for the runny nose, and continue the [employer];  But most importantly he must committ to smoking cessation!! Continue same meds...   ~  March 03, 2017:  62mo ROV & pulm f/u visit> Brad Turner reports that the prev back fusion "didn't fuse" & DrDumonski is considering further  surg but wants to wait 25mo (his note of 02/03/17 is reviewed);  He is wearing a T-brace & remains on Tizanidine & Vicodin but has stopped the Lyrica due to swelling;  He got some temporary relief from a CT-guided left SI joint injection on 02/14/17;  From the pulmonary standpoint-- Keylon  states that his breathing has been OK, notes intermit cough, small amt clear sput & chest congestion but denies wheezing/ chest tightness/ CP;  He is smoking 1-1.5ppd even noting that the e-cig was w/o help-- "it's the only way I got thru this"... He remains on Symbicort160-2spBid, VentolinHFA rescue as needed (using several times daily);  We discussed adding MUCINEX/ Gen GFN 400mg -2Tid w/ fluids for the congestion/ thick phlegm/ mucus plugging...    EXAM shows Afeb, VSS, O2sat=98% on RA;  He has lost more wt down to 137# today (not eating, no appetite);  HEENT- dental issues, mallampati2;  Chest- sl congested cough w/ scat basilar rhonchi, decr BS bilat w/o w/r/consolidation;  Heart- RR w/o m/r/g;  Abd- soft, nontender, neg;  Ext- w/o c/c/e.  IMP/PLAN>>  Son is again encouraged to cut back & quit smoking- options for medications, nicotine replacement, counseling, hypnosis, etc discussed but he is not interested;  Continue Symbicort & Ventolin, add GFN as discussed and do the best he can w/ exercise...      NOTE:  >50% of this 50min ov was spent in counseling & coordination of care...  ~  August 04, 2017:  13mo ROV & Brad Turner's breathing is about the same- still smoking ~2ppd w/ cough, thick clear mucus, some wheezing;  He feels that his Brad Turner is more limited by his LBP than his breathing;  Today his CC is dizziness w/ postural BP changes that he thinks is related to diarrhea from [place] food- Rec to take gatorade & use imodium/ align/ activia yogurt;  BP in office today w/ supine BP= 104/70, and standing BP= 80/50...  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    Cigarette smoker>  Still smoking 1.5-2ppd and he has smoked 1-1.5ppd for ~60 yrs; he declines smoking cessation help & is not motivated to quit despite knowing the risks & his love of the trumpet...    Hx cough syncope> past hx syncope w/ severe coughing paroxysms while on an ACE inhibitor- aware    Cardiac issues> HBP, CAD, HL- followed by DrTilley (no notes in Epic), pt reports last seen 09/2016 & cleared for back surg w/ neg nuclear study- on ASA/Plavix75, Diovan160, HCT12.5, Lip80, Tricor145; he quit cardiac rehab maintenance 06/2016    Medical issues> followed by DrTisovec- Hx dysphagia/ stricture/ Barrett's (on Prilosec40), prostate and bladder problems (on Myrbetriq & Cialis), severe LBP & s/p lumbar lam (on Vicodin & Zanaflex & Cymbalta), dysthymia...  EXAM shows Afeb, postural BP changes noted, O2sat=95% on RA;  HEENT- dental issues, mallampati2;  Chest- congested cough w/ scat basilar rhonchi, decr BS bilat w/o w/r/consolidation;  Heart- RR  w/o m/r/g;  Abd- soft, nontender, neg;  Ext- w/o c/c/e.   LABS 08/04/17>  Chems-  Na=132, Cr=1.6;  CBC- Hg=12.6, WBC=8.4;  Sed=29  Low-dose screening CT Chest 08/07/17>  Norm heart size, aortic & coronary calcif, no adenopathy, lungs w/ centrilob & paraseptal emphysema plus peribronch thickening, scat tiny nodules, many calcif c/w granulomas, no suspicious nodules or masses, no focal disease, stable appearance of hepatic & renal cysts... IMP/PLAN>>  As prev discussed on numerous occas- he needs to quit all smoking but is not motivated & refuses smoking cessation help etc;  Continue Symbicort160-2spBid + AlbutHFA rescue inhaler, he refuses the addition of a LAMA to his regimen;  For the diarrhea, postural hypotension- avoid chinese food & MSG, take gatorade/ imodium/ align/ [place] and  f/u w/ PCP- DrTisovec...   ~  February 09, 2018:  83mo ROV & Brad Turner has had a very eventful 65mo interval w/ several hospitalizations & serious illnesses>  He is quite confused about these events and in-fact never filled the prescriptions given to him on disch from the nursing home 3 weeks ago, and presents w/ Brad Turner, Budesonide, Atrovent for Nebulizer written on 12/02/17 but does not have a neb machine at home! We reviewed the following interval Epic-EMR notes avail to us>  HE TELLS ME HE ALSO SEES DrCLANCE at the [facility] in [place]...    He was HOSP 1/13 - 09/18/17 by CARDS- DrSkains>  Hx HBP, CAD w/ mult interventions, Hx pericarditis, HL, & COPD w/ ongoing tobacco abuse & alcohol abuse; ADM w/ CP, abn EKG & elev troponins (NSTEMI, stress-induced) => cath revealed mod nonobstructive CAD (60-65% LAD & OM1/ OM2 lesions) no culprit lesion, EF=35-45% suggestive of Takotsubo cardiomyopathy;  2DEcho showed EF=30-35% w/ areas of AK & Gr1DD;  Medical management was initiated & adjusted- disch on Plavix, MetoprololER, Atorva;  HCT & ARB were held; fluid restricted to 1L/d; Etoh withdrawal rx w/ Librium taper, MVI, thiamine...    He was re-admitted 1/19 - 09/25/17 by [facility]>  Adm w/ confusion/ delirium, ?syncope, postural hypotension, dehydration, & AKI; given IVF, Midodrine, B12 shots started for borderline B12 level, & rec for outpt Neuro eval for dementia; PT rec disch to SNF for rehab...     He was re-admitted 3/22 - 12/03/17 by [facility]>  Acute resp failure due to HCAP requiring intubation & developed a loculated left pleural effusion/ empyema requiring L-VATS & decortication 11/25/17 (grew +strep viridans);  He had an ICU delirium, covered w/ antibiotics, seen by [facility] (bipolar- rec Cymbalta & Seroquel, and they indicated that he had the capacity to refuse SNF), required Tx for anemia; refused rehab disch & sent home w/ max home health support;     He returned to the ER 12/07/17 c/o weakness, constip, back  pain>  He had CT Head and CT Chest/Abd/Pelvis- chr changes, NAD;  He initially declined SNF placement for rehab but after consult w/ care management he agreed to go to [facility] => he was attended by [facility] while in [place];  Note from DrHopper 12/09/17 reviewed;  "He went home with son on 12/30/17 ([place] pass) thinking he was discharged. Police were called to report him missing from the facility. Police found him watching tv while sitting on his couch at home. He drove back to facility the next Tan. He continued rehabilitation at the facility";  Note from NP-Medina-Vargas on 01/14/18 reviewed (disch note)-- disch w/ home health Ot/ PT/ nursing services, & APs for DME;  Disch on NEB  rx w/ Albut, Ipratropium, Budesonide, Symbicort & Mucinex (but he did not have a home nebulizer!);  Numerous meds written but pt never filled these prescriptions at disch-- home health nursing apparently never picked up on these deficiencies during their home assessments...     He was seen by Hyman Bible on 01/20/18>  S/p left empyema w/ VATS, mini-thoracotomy, decortication on 11/24/17 by DrGerhardt; pt reported weakness, but denied f/c/s, sputum, etc; CXR showed persistent density loculated along the posterior chest wall of the left hemithorax, no new abn seen...     He reports that he had PCP follow up visit w/ DrTisovec 3 wks ago>  We do not have access to his medical records...  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    S/P HCAP w/ acute resp failure requiring intubation, & left empyema requiring VATS, mini-thoracotomy &  decortication on 11/24/17 by DrGerhardt...    Pulm nodules on CT Chest> Yearly f/u low-dose screening CT done 08/2017- norm heart size, aortic & coronary calcif, no adenopathy, lungs w/ centrilob & paraseptal emphysema plus peribronch thickening, scat tiny nodules, many calcif c/w granulomas, no suspicious nodules or masses, no focal disease, stable appearance of hepatic & renal cysts.    Cigarette smoker>  Still smoking 1-1.5ppd (even after his mult hosp & emphyema surg) and even has a pack rolled up in his left shoulder sleeve during today's office visit!    Hx cough syncope> past hx syncope w/ severe coughing paroxysms while on an ACE inhibitor- aware    Cardiac issues> HBP, CAD- s/p mult interventions, episode Takotsubo cardiomyopathy 09/2017, HL- followed by DrTilley/ DrSkains (no notes in Epic), pt reports last seen 09/2016 & cleared for back surg w/ neg nuclear study- on ASA/Plavix75, Diovan160, HCT12.5, Lip80, Tricor145; he quit cardiac rehab maintenance 06/2016; mult med changes in hosp spring 2019 but what was he discharged on from [facility] rehab stay?    Medical issues> followed by DrTisovec- Hx dysphagia/ stricture/ Barrett's (on Prilosec40), prostate and bladder problems (on Myrbetriq & Cialis), severe LBP & s/p lumbar lam (on Vicodin & Zanaflex & Cymbalta), dysthymia/ ?bipolar w/ hx delirium, ?dementia?;  Disch from [facility] on Cymbalta30- 3/d and Seroquel25mg  Qhs... EXAM shows thin, weak, Afeb, BP=154/60, O2sat=98% on RA;  Wt=115#, BMI= 19;  HEENT- dental issues, mallampati2;  Chest- congested cough w/ scat basilar rhonchi, decr BS left base w/o w/r/consolidation;  Heart- RR w/o m/r/g;  Abd- soft, nontender, neg;  Ext- w/o c/c/e.   Last CXR 01/20/18>   showed persistent density loculated along the posterior chest wall of the left hemithorax, no new abn seen...   Ambulatory Oximetry 02/09/18>  O2sat=97% on RA at rest w/ pulse=69/min;  He walked just one lap (185') w/ lowest O2sat=93% on RA  w/ pulse=110/min...  IMP/PLAN>>  WOW- Paden has really been thru it!  From the pulmonary perspective- 1) he needs to quit all smoking, and again declines all smoking cessation help!  2) he needs a NEBULIZER & directed to use it w/ the BROVANA 80mcg/2ml Bid + BUDESONIDE 0.5mg  Bid;  3) continue the SYMBICORT160-2spBid;  4) continue MUCINEX600-2 po  bid w/ fluids... He is rec to f/u w/ CARDS ASAP and maintain general medical f/u w/ DrTisovec regularly for his medical issues including: back pain, wekness, anemia, psyche, etc... We plan ROV recheck w/ CXR & labs in 32mo...  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 We reviewed the following interval Epic-EMR notes avail to us>  HE TELLS ME HE ALSO SEES DrCLANCE at the [facility] in [place]...    He saw [facility]- DrTilley 02/10/18>  HBP, CAD- s/p PCI, Takotsubo syndrome, chr sys CHF, PVCs, Hx TIA, HL; on ASA81, Plavix75, MetropER50, Lasix20, Lip80, no longer taking Midodrine & it's hard to know what he is actually taking; back to his home & living alone- using a walker, still drinking, still smoking; refused home health care; rec to f/u in 15mo...    He tells me that he saw PCP- DrTisovec 2d ago and no changes were made to his medication regimen (we do not have access to notes to review)...    He reports that he fell in his garage yest & bruised up his right arm- did not call PCP, did not go to the ER or UC... We reviewed the following medical problems during today's office visit>      COPD, emphysema> on NEB w/ Brovana Bid now, Symbicort160-2spBid, ProventilHFA & Mucinex600-2Bid prn; he has repeatedly refused LAMA meds in the past; c/o some cough,  congestion, small amt clear sputum, no hemoptysis; he notes some SOB w/ walking but still plays his trumpet he says...    S/P HCAP w/ acute resp failure requiring intubation, & left empyema requiring VATS, mini-thoracotomy & decortication on 11/24/17 by DrGerhardt>  Epic records reviewed...    Pulm nodules on CT Chest> Yearly f/u low-dose screening CT done 08/2017- norm heart size, aortic & coronary calcif, no adenopathy, lungs w/ centrilob & paraseptal emphysema plus peribronch thickening, scat tiny nodules, many calcif c/w granulomas, no suspicious nodules or masses, no focal disease, stable appearance of hepatic & renal cysts.    Cigarette smoker>  Still smoking 1.5ppd (even after his mult hosp & emphyema surg) and even had a pack rolled up in his left shoulder sleeve during recentoffice visit!    Hx cough syncope> past hx syncope w/ severe coughing paroxysms while on an ACE inhibitor- aware    Cardiac issues> HBP, CAD- s/p mult interventions, episode Takotsubo cardiomyopathy 09/2017, HL- followed by DrTilley/ DrSkains (no notes in Epic), pt reports last seen 09/2016 & cleared for back surg w/ neg nuclear study- on ASA/Plavix75, off Diovan160 & on MetopER50, Lasix20, Lip80, off Tricor145; he quit cardiac rehab maintenance 06/2016; mult med changes in hosp spring 2019 but what was he discharged on from [facility] rehab stay?    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 EXAM shows thin, weak, Afeb, BP=110/66, O2sat=99% on RA;  Wt=112#, BMI= 19;  HEENT- dental issues, mallampati2;  Chest- congested cough w/ scat  basilar rhonchi, decr BS left base w/o w/r/consolidation;  Heart- RR w/o m/r/g;  Abd- soft, nontender, neg;  Ext- skin tear  on arm from fall, w/o c/c/e.   LABS 03/02/18 from [facility] office>  [place]- Na=133, K=3.9, HCO#=24, Cr=0.8, BS-99, LFTs=wnl x AlkPhos-154 & Alb-3,0;  CBC- ok w/ Hg=13.2, WBC=8.6  CXR 03/11/18>  Norm heart size, underlying COPD/ emphysema, clear right lung, post loculated left pleural effusion is smaller compared to prev films... IMP/PLAN>>   Brad Turner is encouraged to cut back & quit the smoking & drinking, continue the Brovana NEBS Bid, Symbicort- 2spBid, Mucinex600-2tabs Bid regularly;  We cleaned & dressed his arm skin tear & referred him to the [facility] for management;  He will maintain follow up w/ PCP-DrTisovec, [facility]- DrTilley, & I have asked him to return in 71mo w/ all his med bottles for [place] to review...   ~  April 15, 2018:  39mo ROV        Past Medical History:  Diagnosis Date  . Alcohol withdrawal ([place])   . Allergy    seasonal  . Arthritis   . Barrett esophagus   . BPH (benign prostatic hyperplasia)   . CAD (coronary artery disease)    a. s/p multiple POBA last in 2011 to distal OM2. b. Takotsubo event 09/2017 with cath showing no culprit, 25% prox LAD, 60% OM1, 65% OM2, EF 35-40% by cath and 30-35% by echo.  . Chronic back pain   . COPD (chronic obstructive pulmonary disease) ([facility])    Albuterol inhaler as needed.Uses Symbicort daily  . Depression   . Emphysema lung ([place])   . Fall    fell on 10/06/16 with loss of consiousness for a short time.States he remembers getting up but remembers nothing else.  . Gastric ulcer   . Gastritis   . GERD (gastroesophageal reflux disease)    takes Omeprazole daily  . History of colon polyps    benign  . History of kidney stones   . Hyperlipidemia   . Hypertension   . Hyponatremia   . 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 Neuropathy   . Nocturia   . Pneumonia    hx of  . PONV (postoperative nausea and vomiting)   . Skin cancer   . Stroke [facility]) 1995   Affected balance; and has very  emotional if watching a movie  . Syncope    yr ago  . Takotsubo cardiomyopathy    a. 09/2017 - EF 35-40% by cath, 30-35% by echo.  . Urgency of urination    takes Myrbetriq daily    Past Surgical History:  Procedure Laterality Date  . APPENDECTOMY    . CARDIAC CATHETERIZATION     x 4. Most recent one in 2011 at [facility](3 previous were done 20 yrs before)  . cataracts Bilateral   . CHEST EXPLORATION Left 11/24/2017   Procedure: CHEST EXPLORATION;  Surgeon: [name], MD;  Location: [place];  Service: Thoracic;  Laterality: Left;  . COLONOSCOPY    . CYSTOSCOPY  07/21/2013   Dr. Jeffie Pollock  . DENTAL SURGERY     with sedation  . ESOPHAGEAL MANOMETRY N/A 09/20/2015   Procedure: ESOPHAGEAL MANOMETRY (EM);  Surgeon: Manus Gunning, MD;  Location: [facility];  Service: Gastroenterology;  Laterality: N/A;  . LEFT HEART CATH AND CORONARY ANGIOGRAPHY N/A 09/15/2017   Procedure: LEFT HEART CATH AND CORONARY ANGIOGRAPHY;  Surgeon: Martinique, Peter M,  MD;  Location: [place] [facility];  Service: Cardiovascular;  Laterality: N/A;  . LUMBAR LAMINECTOMY/DECOMPRESSION MICRODISCECTOMY N/A 01/12/2015   Procedure: LUMBAR LAMINECTOMY/DECOMPRESSION MICRODISCECTOMY 3 LEVELS;  Surgeon: Phylliss Bob, MD;  Location: [place];  Service: Orthopedics;  Laterality: N/A;  Lumbar 3-4, lumbar 4-5, lumbar 5-sacrum 1 decompression  . parathyroid adenoma removed    . POLYPECTOMY    . ROTATOR CUFF REPAIR     bilateral  . TONSILLECTOMY    . UPPER GASTROINTESTINAL ENDOSCOPY    . VIDEO ASSISTED THORACOSCOPY (VATS)/EMPYEMA Left 11/24/2017   Procedure: VIDEO ASSISTED THORACOSCOPY (VATS)/EMPYEMA;  Surgeon: [name], MD;  Location: [place];  Service: Thoracic;  Laterality: Left;  Marland Kitchen VIDEO BRONCHOSCOPY N/A 11/24/2017   Procedure: VIDEO BRONCHOSCOPY;  Surgeon: [name], MD;  Location: [place];  Service: Thoracic;  Laterality: N/A;    Outpatient Encounter Medications as of 04/15/2018  Medication Sig  .  acetaminophen (TYLENOL) 650 MG CR tablet Take 650 mg by mouth every 8 (eight) hours.   Marland Kitchen albuterol (PROVENTIL HFA;VENTOLIN HFA) 108 (90 BASE) MCG/ACT inhaler Inhale 2 puffs into the lungs every 6 (six) hours as needed for wheezing or shortness of breath. Reported on 11/29/2015  . arformoterol (BROVANA) 15 MCG/2ML NEBU Take 2 mLs (15 mcg total) by nebulization 2 (two) times daily.  Marland Kitchen atorvastatin (LIPITOR) 80 MG tablet Take 80 mg by mouth daily.   . clopidogrel (PLAVIX) 75 MG tablet Take 75 mg by mouth daily.  . DULoxetine (CYMBALTA) 30 MG capsule Take 30 mg by mouth daily.  . folic acid (FOLVITE) 1 MG tablet Take 1 tablet (1 mg total) by mouth daily.  Marland Kitchen guaiFENesin (MUCINEX) 600 MG 12 hr tablet Take 600 mg by mouth 2 (two) times daily as needed for cough or to loosen phlegm.   . metoprolol succinate (TOPROL-XL) 50 MG 24 hr tablet Take 1 tablet (50 mg total) by mouth daily. Take with or immediately following a meal.  . MYRBETRIQ 50 MG TB24 tablet Take 50 mg by mouth daily.   Marland Kitchen NITROSTAT 0.4 MG SL tablet Place 0.4 mg under the tongue every 5 (five) minutes as needed for chest pain. Reported on 11/29/2015  . Nutritional Supplements (NUTRITIONAL SUPPLEMENT PO) Take 1 each by mouth 2 (two) times daily. Health Shakes  . omeprazole (PRILOSEC) 40 MG capsule TAKE ONE CAPSULE BY MOUTH TWICE DAILY  . SYMBICORT 160-4.5 MCG/ACT inhaler INHALE TWO PUFFS INTO LUNGS TWICE DAILY  . Tiotropium Bromide Monohydrate (SPIRIVA RESPIMAT) 2.5 MCG/ACT AERS Inhale 2 puffs into the lungs. Gets from [place]   No facility-administered encounter medications on file as of 04/15/2018.      Allergies  Allergen Reactions  . Ivp Dye [Iodinated Diagnostic Agents] Hives and Shortness Of Breath    reaction was when pt was 77 years old.  Clancy Gourd [Nitrofurantoin] Other (See Comments)    Fever that lasted several months    Immunization History  Administered Date(s) Administered  . Influenza Split 05/14/2012  . Influenza Whole  06/02/2010, 05/09/2011  . Influenza, High Dose Seasonal PF 06/17/2016, 06/04/2017  . Influenza,inj,Quad PF,6+ Mos 06/02/2013, 05/24/2014  . Influenza-Unspecified 05/31/2015  . Pneumococcal Conjugate-13 09/02/2014  . Pneumococcal Polysaccharide-23 06/02/2009  . Tdap 10/29/2014  HE WILL CHECK w/ DrTISOVEC regarding PREVNAR-13 vaccination...   Current Medications, Allergies, Past Medical History, Past Surgical History, Family History, and Social History were reviewed in [employer] record.   Review of Systems        All symptoms NEG  except where BOLDED >>  Constitutional:  F/C/S, fatigue, anorexia, unexpected weight change. HEENT:  HA, visual changes, hearing loss, earache, nasal symptoms, sore throat, mouth sores, hoarseness. Resp:  cough, sputum, hemoptysis; SOB, tightness, wheezing. Cardio:  CP, palpit, DOE, orthopnea, edema. GI:  N/V/D/C, blood in stool; reflux, abd pain, distention, gas. GU:  dysuria, freq, urgency, hematuria, flank pain, voiding difficulty. MS:  joint pain, swelling, tenderness, decr ROM; neck pain, back pain, etc. Neuro:  HA, tremors, seizures, dizziness, syncope, weakness, numbness, gait abn. Skin:  suspicious lesions or skin rash. Heme:  adenopathy, bruising, bleeding. Psyche:  confusion, agitation, sleep disturbance, hallucinations, anxiety, depression suicidal.     Objective:   Physical Exam    Vital Signs:  Reviewed...   General:  WD, WN, 77 y/o WM chr ill appearing; alert & oriented; pleasant & cooperative... HEENT:  Forehead laceration, bilat black eyes; Conjunctiva- red, Sclera- nonicteric, EOM-wnl, PERRLA; EACs-clear, TMs-wnl; NOSE-clear; THROAT- sl red Neck:  Supple w/ decr ROM; no JVD; normal carotid impulses w/o bruits; no thyromegaly or nodules palpated; no lymphadenopathy.  Chest:  Decr BS bilat w/ few scat rhonchi at bases, congested cough, no wheezing/ rales/ or consolidation... Heart:  Regular Rhythm; norm S1 & S2  without murmurs, rubs, or gallops detected. Abdomen:  Soft & nontender- no guarding or rebound; normal bowel sounds; no organomegaly or masses palpated. Ext:  Decr ROM; without deformities, +arthritic changes; no varicose veins, venous insuffic, or edema;  Pulses intact w/o bruits. Neuro:  CNs II-XII intact; motor testing normal; sensory testing normal; +gait abn, sl leg weakness, balance is off... Derm:  No lesions noted x bilat ecchymoses periorbitally... Lymph:  No cervical, supraclavicular, axillary, or inguinal adenopathy palpated.    Assessment & Plan:    IMP >>     COPD> on Symbicort160-2spBid, ProventilHFA prn; he never filled the Incruse or Spiriva ($$); rec to add GFN400-2Qid + fluids...    S/P HCAP w/ acute resp failure requiring intubation, & left empyema requiring VATS, mini-thoracotomy & decortication on 11/24/17 by DrGerhardt...    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    Cigarette smoker>  Still smoking 1.5ppd and he has smoked 1-1.5ppd for ~60 yrs; he declines smoking cessation help & is not motivated to quit...    Hx cough syncope> this occurred remotely w/ severe coughing paroxysm while he was on an ACE inhib...    CARDIAC issues> HBP, CAD, HL- followed by DrTilley (no notes in Epic)- on ASA/Plavix75, Diovan160, HCT12.5, Lip80, Tricor145...    MEDICAL ISSUES>        Dysphagia, hx esoph stricture & Barrett's esoph> on Prilosec40; eval by DrKaplan in past w/ hx dilatation in the past, now followed by DrArmbruster...       GU issues> on Myrbetriq50, Cialis5       Ortho- LBP w/ lumbar lam 2016 by DrDumonski who now plans further extensive surg...       Anxiety/depression> prev on WellbutrinXL150; his son is treated for depression, friend died w/ metastatic lung cancer  PLAN >>   06/07/15>   [place] & I discussed his continued smoking & he is not  motivated to quit- offered meds and he will call if he changes his mind;  Rec to continue Symbicort160-2spBid, he was tried on Spiriva respimat in past but wouldn't fill it due to cost- I pointed out that if he'd quit smoking he  could get the Spiriva & noted that his breathing would be better due to both things! Rec to add Mucinex 2400mg /d.Marland KitchenMarland KitchenWe plan ROV 63months. 12/07/15>   Brad Turner is unable to quit smoking and has mod COPD w/ emphysema on his CTscans- we will add INCRUSE one inhalation daily; he knows to avoid infections & be sure he is up to date on all vaccinations from DrTisovec. 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; same Rx & we plan ROV 44mo 10/09/16>   Brad Turner's pulmonary situation remains the same- mixed COPD w/ emphysematous and chr bronchitic components, still smoking 1.5ppd, using his Symbicort (but I suspect not regularly), refuses addition of a LAMA med that would be additionally beneficial in the long run;  He improved after Zithromax & Pred pack per DrTisovec recently & we will place him on a sl longer course of oral PRED (see AVS), plus maximize mucolytic therapy w/ GFN400-2Qid + fluids;  His recent ?syncopal spell does not appear to be cough related- perhaps a fall from leg weakness, head trauma w/ concussion & retrograde amnesia for the fall- in any event he did not go to the ER & is in need of concussion eval/ CT vs MRI head (he is on ASA/Plavix)/ etc; pt has an ASAP appt w/ his PCP in the AM... I am concerned regarding his surgical risk. 11/28/16>   Brad Turner is rec to add a nutritional supplements to his regimen, try Astelin NS for the runny nose, and  continue the [employer];  But most importantly he must committ to smoking cessation!! Continue same meds 03/03/17>   Brad Turner is again encouraged to cut back & quit smoking- options for medications, nicotine replacement, counseling, hypnosis, etc discussed but he is not interested;  Continue Symbicort & Ventolin, add GFN as discussed and do the best he can w/ exercise. 08/04/17>   As prev discussed on numerous occais- he needs to quit all smoking but is not motivated & refuses smoking cessation help etc;  Continue Symbicort160-2spBid + AlbutHFA rescue inhaler, he refuses the addition of a LAMA to his regimen;  For the diarrhea, postural hypotension- avoid chinese food & MSG, take gatorade/ imodium/ align/ [place] and f/u w/ PCP- DrTisovec... 02/09/18>   WOW- Brad Turner has really been thru it!  From the pulmonary perspective- 1) he needs to quit all smoking, and again declines all smoking cessation help!  2) he needs a NEBULIZER & directed to use it w/ the BROVANA 38mcg/2ml Bid + BUDESONIDE 0.5mg  Bid;  3) continue the SYMBICORT160-2spBid;  4) continue MUCINEX600-2 po bid w/ fluids... He is rec to f/u w/ CARDS ASAP and maintain general medical f/u w/ DrTisovec regularly for his medical issues including: back pain, wekness, anemia, psyche, etc... We plan ROV recheck w/ CXR & labs in 23mo... 03/11/18>   Brad Turner is encouraged to cut back & quit the smoking & drinking, continue the Brovana NEBS Bid, Symbicort- 2spBid, Mucinex600-2tabs Bid regularly;  We cleaned & dressed his arm skin tear & referred him to the [facility] for management;  He will maintain follow up w/ PCP-DrTisovec, CARDS- DrTilley, & I have asked him  to return in 14mo w/ all his med bottles for [place] to review.   Patient's Medications  New Prescriptions   No medications on file  Previous Medications   ACETAMINOPHEN (TYLENOL) 650 MG CR TABLET    Take 650 mg by mouth every 8 (eight) hours.    ALBUTEROL (PROVENTIL HFA;VENTOLIN HFA) 108 (90 BASE) MCG/ACT  INHALER    Inhale 2 puffs into the lungs every 6 (six) hours as needed for wheezing or shortness of breath. Reported on 11/29/2015   ARFORMOTEROL (BROVANA) 15 MCG/2ML NEBU    Take 2 mLs (15 mcg total) by nebulization 2 (two) times daily.   ATORVASTATIN (LIPITOR) 80 MG TABLET    Take 80 mg by mouth daily.    CLOPIDOGREL (PLAVIX) 75 MG TABLET    Take 75 mg by mouth daily.   DULOXETINE (CYMBALTA) 30 MG CAPSULE    Take 30 mg by mouth daily.   FOLIC ACID (FOLVITE) 1 MG TABLET    Take 1 tablet (1 mg total) by mouth daily.   GUAIFENESIN (MUCINEX) 600 MG 12 HR TABLET    Take 600 mg by mouth 2 (two) times daily as needed for cough or to loosen phlegm.    METOPROLOL SUCCINATE (TOPROL-XL) 50 MG 24 HR TABLET    Take 1 tablet (50 mg total) by mouth daily. Take with or immediately following a meal.   MYRBETRIQ 50 MG TB24 TABLET    Take 50 mg by mouth daily.    NITROSTAT 0.4 MG SL TABLET    Place 0.4 mg under the tongue every 5 (five) minutes as needed for chest pain. Reported on 11/29/2015   NUTRITIONAL SUPPLEMENTS (NUTRITIONAL SUPPLEMENT PO)    Take 1 each by mouth 2 (two) times daily. Health Shakes   OMEPRAZOLE (PRILOSEC) 40 MG CAPSULE    TAKE ONE CAPSULE BY MOUTH TWICE DAILY   SYMBICORT 160-4.5 MCG/ACT INHALER    INHALE TWO PUFFS INTO LUNGS TWICE DAILY   TIOTROPIUM BROMIDE MONOHYDRATE (SPIRIVA RESPIMAT) 2.5 MCG/ACT AERS    Inhale 2 puffs into the lungs. Gets from [place] [facility]   No medications on file  Discontinued Medications   No medications on file

## 2018-04-15 NOTE — Patient Instructions (Addendum)
Today we updated your med list in our EPIC system...    Continue your current medications the same...  Continue the SYMBICORT160 - 2 sprays twice daily...  Continue the SPIRIVA RESPIMAT - 2 sprays once daily...  QUIT THE SMOKING, Ronalee Belts!!!  Work on your physical therapy, exercise, nurition - to build strength & stamina...  Call for any questions...  Let's plan a follow up visit in 2-3 months, sooner if needed for problems.Marland KitchenMarland Kitchen

## 2018-04-25 ENCOUNTER — Emergency Department (HOSPITAL_COMMUNITY): Payer: Medicare Other

## 2018-04-25 ENCOUNTER — Observation Stay (HOSPITAL_COMMUNITY)
Admission: EM | Admit: 2018-04-25 | Discharge: 2018-04-26 | Disposition: A | Discharge status: Home Health | Payer: Medicare Other | Attending: Internal Medicine | Admitting: Internal Medicine

## 2018-04-25 ENCOUNTER — Other Ambulatory Visit: Payer: Self-pay

## 2018-04-25 DIAGNOSIS — Z87442 Personal history of urinary calculi: Secondary | ICD-10-CM | POA: Insufficient documentation

## 2018-04-25 DIAGNOSIS — J439 Emphysema, unspecified: Secondary | ICD-10-CM | POA: Insufficient documentation

## 2018-04-25 DIAGNOSIS — J90 Pleural effusion, not elsewhere classified: Secondary | ICD-10-CM | POA: Insufficient documentation

## 2018-04-25 DIAGNOSIS — S0240DA Maxillary fracture, left side, initial encounter for closed fracture: Secondary | ICD-10-CM | POA: Diagnosis not present

## 2018-04-25 DIAGNOSIS — K2270 Barrett's esophagus without dysplasia: Secondary | ICD-10-CM | POA: Insufficient documentation

## 2018-04-25 DIAGNOSIS — S022XXA Fracture of nasal bones, initial encounter for closed fracture: Secondary | ICD-10-CM | POA: Insufficient documentation

## 2018-04-25 DIAGNOSIS — Z720 Tobacco use: Secondary | ICD-10-CM | POA: Diagnosis present

## 2018-04-25 DIAGNOSIS — Z91041 Radiographic dye allergy status: Secondary | ICD-10-CM | POA: Insufficient documentation

## 2018-04-25 DIAGNOSIS — I509 Heart failure, unspecified: Secondary | ICD-10-CM | POA: Diagnosis not present

## 2018-04-25 DIAGNOSIS — I110 Hypertensive heart disease with heart failure: Secondary | ICD-10-CM | POA: Insufficient documentation

## 2018-04-25 DIAGNOSIS — F17210 Nicotine dependence, cigarettes, uncomplicated: Secondary | ICD-10-CM | POA: Diagnosis not present

## 2018-04-25 DIAGNOSIS — E785 Hyperlipidemia, unspecified: Secondary | ICD-10-CM | POA: Insufficient documentation

## 2018-04-25 DIAGNOSIS — Z881 Allergy status to other antibiotic agents status: Secondary | ICD-10-CM | POA: Insufficient documentation

## 2018-04-25 DIAGNOSIS — M4802 Spinal stenosis, cervical region: Secondary | ICD-10-CM | POA: Insufficient documentation

## 2018-04-25 DIAGNOSIS — S022XXB Fracture of nasal bones, initial encounter for open fracture: Secondary | ICD-10-CM

## 2018-04-25 DIAGNOSIS — Y907 Blood alcohol level of 200-239 mg/100 ml: Secondary | ICD-10-CM | POA: Insufficient documentation

## 2018-04-25 DIAGNOSIS — F32A Depression, unspecified: Secondary | ICD-10-CM | POA: Diagnosis present

## 2018-04-25 DIAGNOSIS — S61412A Laceration without foreign body of left hand, initial encounter: Secondary | ICD-10-CM | POA: Insufficient documentation

## 2018-04-25 DIAGNOSIS — T148XXA Other injury of unspecified body region, initial encounter: Secondary | ICD-10-CM | POA: Diagnosis present

## 2018-04-25 DIAGNOSIS — S51011A Laceration without foreign body of right elbow, initial encounter: Secondary | ICD-10-CM | POA: Diagnosis not present

## 2018-04-25 DIAGNOSIS — I700 Atherosclerosis of aorta: Secondary | ICD-10-CM | POA: Diagnosis not present

## 2018-04-25 DIAGNOSIS — S41111A Laceration without foreign body of right upper arm, initial encounter: Secondary | ICD-10-CM | POA: Diagnosis not present

## 2018-04-25 DIAGNOSIS — W19XXXA Unspecified fall, initial encounter: Secondary | ICD-10-CM | POA: Diagnosis not present

## 2018-04-25 DIAGNOSIS — S41112A Laceration without foreign body of left upper arm, initial encounter: Secondary | ICD-10-CM | POA: Insufficient documentation

## 2018-04-25 DIAGNOSIS — R3915 Urgency of urination: Secondary | ICD-10-CM | POA: Insufficient documentation

## 2018-04-25 DIAGNOSIS — F329 Major depressive disorder, single episode, unspecified: Secondary | ICD-10-CM | POA: Insufficient documentation

## 2018-04-25 DIAGNOSIS — Z85828 Personal history of other malignant neoplasm of skin: Secondary | ICD-10-CM | POA: Insufficient documentation

## 2018-04-25 DIAGNOSIS — R55 Syncope and collapse: Principal | ICD-10-CM | POA: Insufficient documentation

## 2018-04-25 DIAGNOSIS — F1010 Alcohol abuse, uncomplicated: Secondary | ICD-10-CM | POA: Diagnosis present

## 2018-04-25 DIAGNOSIS — S02401A Maxillary fracture, unspecified, initial encounter for closed fracture: Secondary | ICD-10-CM | POA: Diagnosis present

## 2018-04-25 DIAGNOSIS — R627 Adult failure to thrive: Secondary | ICD-10-CM | POA: Insufficient documentation

## 2018-04-25 DIAGNOSIS — G8929 Other chronic pain: Secondary | ICD-10-CM | POA: Diagnosis not present

## 2018-04-25 DIAGNOSIS — F10129 Alcohol abuse with intoxication, unspecified: Secondary | ICD-10-CM | POA: Diagnosis not present

## 2018-04-25 DIAGNOSIS — I2510 Atherosclerotic heart disease of native coronary artery without angina pectoris: Secondary | ICD-10-CM | POA: Insufficient documentation

## 2018-04-25 DIAGNOSIS — Z8673 Personal history of transient ischemic attack (TIA), and cerebral infarction without residual deficits: Secondary | ICD-10-CM | POA: Insufficient documentation

## 2018-04-25 DIAGNOSIS — I5181 Takotsubo syndrome: Secondary | ICD-10-CM | POA: Diagnosis present

## 2018-04-25 DIAGNOSIS — Z8249 Family history of ischemic heart disease and other diseases of the circulatory system: Secondary | ICD-10-CM | POA: Insufficient documentation

## 2018-04-25 DIAGNOSIS — D509 Iron deficiency anemia, unspecified: Secondary | ICD-10-CM | POA: Insufficient documentation

## 2018-04-25 DIAGNOSIS — Z981 Arthrodesis status: Secondary | ICD-10-CM | POA: Insufficient documentation

## 2018-04-25 DIAGNOSIS — Z7902 Long term (current) use of antithrombotics/antiplatelets: Secondary | ICD-10-CM | POA: Insufficient documentation

## 2018-04-25 DIAGNOSIS — S51012A Laceration without foreign body of left elbow, initial encounter: Secondary | ICD-10-CM | POA: Diagnosis not present

## 2018-04-25 DIAGNOSIS — Z6820 Body mass index (BMI) 20.0-20.9, adult: Secondary | ICD-10-CM | POA: Insufficient documentation

## 2018-04-25 DIAGNOSIS — F10920 Alcohol use, unspecified with intoxication, uncomplicated: Secondary | ICD-10-CM

## 2018-04-25 DIAGNOSIS — S61411A Laceration without foreign body of right hand, initial encounter: Secondary | ICD-10-CM | POA: Insufficient documentation

## 2018-04-25 DIAGNOSIS — Z8711 Personal history of peptic ulcer disease: Secondary | ICD-10-CM | POA: Insufficient documentation

## 2018-04-25 DIAGNOSIS — K219 Gastro-esophageal reflux disease without esophagitis: Secondary | ICD-10-CM | POA: Insufficient documentation

## 2018-04-25 DIAGNOSIS — Z86010 Personal history of colonic polyps: Secondary | ICD-10-CM | POA: Insufficient documentation

## 2018-04-25 DIAGNOSIS — Z79899 Other long term (current) drug therapy: Secondary | ICD-10-CM | POA: Insufficient documentation

## 2018-04-25 DIAGNOSIS — Z7951 Long term (current) use of inhaled steroids: Secondary | ICD-10-CM | POA: Insufficient documentation

## 2018-04-25 DIAGNOSIS — Z955 Presence of coronary angioplasty implant and graft: Secondary | ICD-10-CM | POA: Insufficient documentation

## 2018-04-25 LAB — CBC WITH DIFFERENTIAL/PLATELET
Abs Immature Granulocytes: 0 10*3/uL (ref 0.0–0.1)
BASOS ABS: 0.1 10*3/uL (ref 0.0–0.1)
Basophils Relative: 1 %
EOS ABS: 0.2 10*3/uL (ref 0.0–0.7)
Eosinophils Relative: 2 %
HEMATOCRIT: 37.9 % — AB (ref 39.0–52.0)
Hemoglobin: 12.5 g/dL — ABNORMAL LOW (ref 13.0–17.0)
Immature Granulocytes: 0 %
LYMPHS ABS: 3.3 10*3/uL (ref 0.7–4.0)
Lymphocytes Relative: 44 %
MCH: 33.9 pg (ref 26.0–34.0)
MCHC: 33 g/dL (ref 30.0–36.0)
MCV: 102.7 fL — ABNORMAL HIGH (ref 78.0–100.0)
Monocytes Absolute: 0.6 10*3/uL (ref 0.1–1.0)
Monocytes Relative: 8 %
NEUTROS PCT: 45 %
Neutro Abs: 3.4 10*3/uL (ref 1.7–7.7)
Platelets: 290 10*3/uL (ref 150–400)
RBC: 3.69 MIL/uL — AB (ref 4.22–5.81)
RDW: 13.6 % (ref 11.5–15.5)
WBC: 7.5 10*3/uL (ref 4.0–10.5)

## 2018-04-25 NOTE — ED Provider Notes (Signed)
[facility] EMERGENCY DEPARTMENT Provider Note   CSN: [id number] Arrival date & time: 04/25/18  2304     History   Chief Complaint Chief Complaint  Patient presents with  . Fall    HPI Brad Turner is a 77 y.o. male.  Patient brought in by EMS after apparent fall.  EMS was called due to life alert going off.  Patient does not remember falling.  He admits to having several alcoholic beverages tonight.  He is on Plavix.  He does not remember feeling dizzy or lightheaded.  Does not know what he fell against her head his head on.  He has facial trauma as well as bilateral arm skin tears.  He does take Plavix.  He denies any dizziness or lightheadedness.  Complains of pain to his face and bilateral arms.  No chest pain or shortness of breath.  Complains of his whole "body being sore".  Patient with a history of CAD status post stents, COPD, previous episodes of syncope.  Denies any focal weakness, numbness or tingling.  He is up-to-date on his tetanus shot.  The history is provided by the patient and the EMS personnel.  Fall  Pertinent negatives include no chest pain, no abdominal pain, no headaches and no shortness of breath.    Past Medical History:  Diagnosis Date  . Alcohol withdrawal ([place])   . Allergy    seasonal  . Arthritis   . Barrett esophagus   . BPH (benign prostatic hyperplasia)   . CAD (coronary artery disease)    a. s/p multiple POBA last in 2011 to distal OM2. b. Takotsubo event 09/2017 with cath showing no culprit, 25% prox LAD, 60% OM1, 65% OM2, EF 35-40% by cath and 30-35% by echo.  . Chronic back pain   . COPD (chronic obstructive pulmonary disease) (HCC)    Albuterol inhaler as needed.Uses Symbicort daily  . Depression   . Emphysema lung ([place])   . Fall    fell on 10/06/16 with loss of consiousness for a short time.States he remembers getting up but remembers nothing else.  . Gastric ulcer   . Gastritis   . GERD (gastroesophageal reflux  disease)    takes Omeprazole daily  . History of colon polyps    benign  . History of kidney stones   . Hyperlipidemia   . Hypertension   . Hyponatremia   . 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 Neuropathy   . Nocturia   . Pneumonia    hx of  . PONV (postoperative nausea and vomiting)   . Skin cancer   . Stroke [facility]) 1995   Affected balance; and has very emotional if watching a movie  . Syncope    yr ago  . Takotsubo cardiomyopathy    a. 09/2017 - EF 35-40% by cath, 30-35% by echo.  . Urgency of urination    takes Myrbetriq daily    Patient Active Problem List   Diagnosis Date Noted  . Liver mass 01/01/2018  . Adjustment disorder with depressed mood 12/29/2017  . Neurocognitive disorder 12/21/2017  . Adult failure to thrive 12/09/2017  . Pressure injury of skin 12/01/2017  . Empyema ([place]) 11/24/2017  . Iron deficiency anemia 11/22/2017  . Acute urinary retention 11/22/2017  . Hypokalemia   . Hypomagnesemia   . Parapneumonic effusion   . Sepsis due to pneumonia ([place])   . ETOH abuse 11/21/2017  .  Acute respiratory failure ([place]) 11/21/2017  . HCAP (healthcare-associated pneumonia) 11/21/2017  . Sepsis ([place]) 11/21/2017  . Pleural effusion 11/21/2017  . Acute kidney injury ([place]) 11/21/2017  . Abdominal pain 11/21/2017  . Malnutrition of moderate degree 09/24/2017  . Acute delirium 09/16/2017  . Acute hyponatremia 09/16/2017  . Alcohol withdrawal ([place]) 09/16/2017  . 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 Tobacco abuse 09/16/2017  . Depression 09/16/2017  . History of CVA (cerebrovascular accident) 09/16/2017  . History of Barrett's esophagus 09/16/2017  . Takotsubo cardiomyopathy 09/15/2017  . Chronic back pain 12/01/2016  . S/P lumbar spinal fusion 12/01/2016  . Dysthymia 12/01/2016  . Radiculopathy 10/17/2016  . Abnormal CT of the chest 06/10/2016  . Dysphagia   . Essential  hypertension, benign 10/30/2014    Class: Chronic  . Syncope 10/29/2014  . Cough syncope 10/07/2014  . COPD with acute exacerbation ([place]) 09/29/2014  . Pulmonary nodules 11/22/2013  . Personal history of colonic polyps 06/04/2010  . COPD (chronic obstructive pulmonary disease) with emphysema ([place]) 02/26/2010  . CHEST PAIN, ATYPICAL 02/26/2010  . BARRETTS ESOPHAGUS 05/18/2009  . HYPERLIPIDEMIA 04/28/2008  . Coronary atherosclerosis 04/28/2008  . CVA 04/28/2008  . GERD 04/28/2008  . ARTHRITIS 04/28/2008  . NEPHROLITHIASIS, HX OF 04/28/2008    Past Surgical History:  Procedure Laterality Date  . APPENDECTOMY    . CARDIAC CATHETERIZATION     x 4. Most recent one in 2011 at [facility](3 previous were done 20 yrs before)  . cataracts Bilateral   . CHEST EXPLORATION Left 11/24/2017   Procedure: CHEST EXPLORATION;  Surgeon: [name], MD;  Location: [facility];  Service: Thoracic;  Laterality: Left;  . COLONOSCOPY    . CYSTOSCOPY  07/21/2013   Dr. Jeffie Pollock  . DENTAL SURGERY     with sedation  . ESOPHAGEAL MANOMETRY N/A 09/20/2015   Procedure: ESOPHAGEAL MANOMETRY (EM);  Surgeon: Manus Gunning, MD;  Location: [facility];  Service: Gastroenterology;  Laterality: N/A;  . LEFT HEART CATH AND CORONARY ANGIOGRAPHY N/A 09/15/2017   Procedure: LEFT HEART CATH AND CORONARY ANGIOGRAPHY;  Surgeon: Martinique, Peter M, MD;  Location: [facility];  Service: Cardiovascular;  Laterality: N/A;  . LUMBAR LAMINECTOMY/DECOMPRESSION MICRODISCECTOMY N/A 01/12/2015   Procedure: LUMBAR LAMINECTOMY/DECOMPRESSION MICRODISCECTOMY 3 LEVELS;  Surgeon: Phylliss Bob, MD;  Location: [facility];  Service: Orthopedics;  Laterality: N/A;  Lumbar 3-4, lumbar 4-5, lumbar 5-sacrum 1 decompression  . parathyroid adenoma removed    . POLYPECTOMY    . ROTATOR CUFF REPAIR     bilateral  . TONSILLECTOMY    . UPPER GASTROINTESTINAL ENDOSCOPY    . VIDEO ASSISTED THORACOSCOPY (VATS)/EMPYEMA Left 11/24/2017   Procedure: VIDEO  ASSISTED THORACOSCOPY (VATS)/EMPYEMA;  Surgeon: [name], MD;  Location: [facility];  Service: Thoracic;  Laterality: Left;  Marland Kitchen VIDEO BRONCHOSCOPY N/A 11/24/2017   Procedure: VIDEO BRONCHOSCOPY;  Surgeon: [name], MD;  Location: [facility];  Service: Thoracic;  Laterality: N/A;        Home Medications    Prior to Admission medications   Medication Sig Start Date End Date Taking? Authorizing Provider  acetaminophen (TYLENOL) 650 MG CR tablet Take 650 mg by mouth every 8 (eight) hours.     [provider]  albuterol (PROVENTIL HFA;VENTOLIN HFA) 108 (90 BASE) MCG/ACT inhaler Inhale 2 puffs into the lungs every 6 (six) hours as needed for wheezing or shortness of breath. Reported on 11/29/2015    [provider]  arformoterol (  BROVANA) 15 MCG/2ML NEBU Take 2 mLs (15 mcg total) by nebulization 2 (two) times daily. 02/09/18   Noralee Space, MD  atorvastatin (LIPITOR) 80 MG tablet Take 80 mg by mouth daily.     [provider]  clopidogrel (PLAVIX) 75 MG tablet Take 75 mg by mouth daily.    [provider]  DULoxetine (CYMBALTA) 30 MG capsule Take 30 mg by mouth daily.    [provider]  folic acid (FOLVITE) 1 MG tablet Take 1 tablet (1 mg total) by mouth daily. 09/19/17   Raiford Noble Latif, DO  guaiFENesin (MUCINEX) 600 MG 12 hr tablet Take 600 mg by mouth 2 (two) times daily as needed for cough or to loosen phlegm.     [provider]  metoprolol succinate (TOPROL-XL) 50 MG 24 hr tablet Take 1 tablet (50 mg total) by mouth daily. Take with or immediately following a meal. 09/19/17   [facility], Tami Lin, PA  MYRBETRIQ 50 MG TB24 tablet Take 50 mg by mouth daily.  05/03/15   [provider]  NITROSTAT 0.4 MG SL tablet Place 0.4 mg under the tongue every 5 (five) minutes as needed for chest pain. Reported on 11/29/2015 05/29/13   [provider]  Nutritional Supplements (NUTRITIONAL SUPPLEMENT PO) Take 1 each by mouth 2  (two) times daily. Health Shakes    [provider]  omeprazole (PRILOSEC) 40 MG capsule TAKE ONE CAPSULE BY MOUTH TWICE DAILY 04/22/16   Armbruster, Carlota Raspberry, MD  SYMBICORT 160-4.5 MCG/ACT inhaler INHALE TWO PUFFS INTO LUNGS TWICE DAILY 04/19/14   Clance, Armando Reichert, MD  Tiotropium Bromide Monohydrate (SPIRIVA RESPIMAT) 2.5 MCG/ACT AERS Inhale 2 puffs into the lungs. Gets from [place]    [provider]    Family History Family History  Problem Relation Age of Onset  . Stroke Mother   . Rheum arthritis Mother   . Heart disease Father   . Colon cancer Unknown        ? mat. uncle died age 52  . Colon polyps Neg Hx   . Esophageal cancer Neg Hx   . Rectal cancer Neg Hx   . Stomach cancer Neg Hx     Social History Social History   Tobacco Use  . Smoking status: Current Every [place] Smoker    Packs/[place]: 1.50    Years: 58.00    Pack years: 87.00    Types: Cigarettes  . Smokeless tobacco: Never Used  Substance Use Topics  . Alcohol use: Yes    Alcohol/week: 14.0 standard drinks    Types: 14 Standard drinks or equivalent per week    Comment: every [place], vodka- 1-2 a [place]   . Drug use: No     Allergies   Ivp dye [iodinated diagnostic agents] and Macrodantin [nitrofurantoin]   Review of Systems Review of Systems  Constitutional: Positive for fatigue. Negative for activity change, appetite change and fever.  HENT: Positive for dental problem. Negative for congestion, sinus pressure and sore throat.   Eyes: Negative for visual disturbance.  Respiratory: Negative for cough, chest tightness and shortness of breath.   Cardiovascular: Negative for chest pain.  Gastrointestinal: Negative for abdominal pain, nausea and vomiting.  Genitourinary: Negative for dysuria, hematuria and testicular pain.  Musculoskeletal: Positive for arthralgias, back pain and myalgias.  Skin: Positive for wound.  Neurological: Positive for weakness. Negative for dizziness, light-headedness and  headaches.    all other systems are negative except as noted in the HPI and  PMH.    Physical Exam Updated Vital Signs BP (!) 169/85 (BP Location: Right Arm)   Pulse 76   Temp 97.6 F (36.4 C) (Oral)   Resp 19   Ht 5\' 4"  (1.626 m)   Wt 54.4 kg   SpO2 95%   BMI 20.60 kg/m   Physical Exam  Constitutional: He is oriented to person, place, and time. He appears well-developed and well-nourished. No distress.  HENT:  Head: Normocephalic and atraumatic.  Mouth/Throat: Oropharynx is clear and moist. No oropharyngeal exudate.  Dried blood to face diffusely.  There is dried blood in bilateral nares, there is a skin tear over the bridge of the nose. Dried blood in mouth.  There is a laceration to the mucosal surface of the upper lip not involving the vermilion border.  No septal hematoma or hemotympanum.  Eyes: Pupils are equal, round, and reactive to light. Conjunctivae and EOM are normal.  Neck: Normal range of motion. Neck supple.  No C-spine tenderness  Cardiovascular: Normal rate, regular rhythm, normal heart sounds and intact distal pulses.  No murmur heard. Pulmonary/Chest: Effort normal and breath sounds normal. No respiratory distress. He exhibits no tenderness.  Abdominal: Soft. There is no tenderness. There is no rebound and no guarding.  Musculoskeletal: Normal range of motion. He exhibits no edema or tenderness.  Full range of motion of hips bilaterally without pain.  No T or L-spine tenderness  Multiple skin tears to left elbow, left dorsal hand, right elbow and right dorsal hand.  There is tenderness to palpation of the right wrist without deformity.  Intact radial pulses bilaterally Abrasion to left knee  Neurological: He is alert and oriented to person, place, and time. No cranial nerve deficit. He exhibits normal muscle tone. Coordination normal.   5/5 strength throughout. CN 2-12 intact.Equal grip strength.   Skin: Skin is warm. Capillary refill takes less than 2  seconds.  Psychiatric: He has a normal mood and affect. His behavior is normal.  Nursing note and vitals reviewed.    ED Treatments / Results  Labs (all labs ordered are listed, but only abnormal results are displayed) Labs Reviewed  CBC WITH DIFFERENTIAL/PLATELET - Abnormal; Notable for the following components:      Result Value   RBC 3.69 (*)    Hemoglobin 12.5 (*)    HCT 37.9 (*)    MCV 102.7 (*)    All other components within normal limits  COMPREHENSIVE METABOLIC PANEL - Abnormal; Notable for the following components:   Albumin 3.2 (*)    Alkaline Phosphatase 155 (*)    All other components within normal limits  ETHANOL - Abnormal; Notable for the following components:   Alcohol, Ethyl (B) 230 (*)    All other components within normal limits  BRAIN NATRIURETIC PEPTIDE - Abnormal; Notable for the following components:   B Natriuretic Peptide 142.7 (*)    All other components within normal limits  BASIC METABOLIC PANEL - Abnormal; Notable for the following components:   Calcium 8.5 (*)    All other components within normal limits  CBC - Abnormal; Notable for the following components:   RBC 3.56 (*)    Hemoglobin 12.1 (*)    HCT 36.8 (*)    MCV 103.4 (*)    All other components within normal limits  TROPONIN I    EKG EKG Interpretation  Date/Time:  Saturday April 25 2018 23:32:59 EDT Ventricular Rate:  71 PR Interval:    QRS Duration: 92 QT  Interval:  435 QTC Calculation: 473 R Axis:   8 Text Interpretation:  Sinus rhythm unchanged from 12/07/17 Confirmed by Ezequiel Essex [phone]) on 04/25/2018 11:37:24 PM   Radiology Dg Chest 2 View  Result Date: 04/26/2018 CLINICAL DATA:  Post fall with bilateral upper extremity pain and skin tears. EXAM: CHEST - 2 VIEW COMPARISON:  Chest radiograph 03/12/2018.  Chest CT 12/07/2017 FINDINGS: Unchanged left pleural effusion and basilar atelectasis since prior exam. No new airspace disease. Chronic hyperinflation and  emphysema. Unchanged heart size and mediastinal contours with aortic atherosclerosis. No pneumothorax. No acute osseous abnormalities. IMPRESSION: 1. No acute findings. 2. Unchanged left pleural effusion and basilar atelectasis from exam 6 weeks ago. 3. Aortic Atherosclerosis (ICD10-I70.0) and Emphysema (ICD10-J43.9). Electronically Signed   By: Jeb Levering M.D.   On: 04/26/2018 01:43   Dg Pelvis 1-2 Views  Result Date: 04/26/2018 CLINICAL DATA:  Post fall with bilateral upper extremity pain and skin tears. EXAM: PELVIS - 1-2 VIEW COMPARISON:  CT 12/07/2017. FINDINGS: The cortical margins of the bony pelvis are intact. No fracture. Pubic symphysis and sacroiliac joints are congruent. Both femoral heads are well-seated in the respective acetabula. Benign-appearing peripherally sclerotic lesion in the left intertrochanteric femur, unchanged. Postsurgical change in the lower lumbar spine. Avascular necrosis of the right femoral head. Avascular necrosis on the left femoral head on prior CT not well seen radiographically. IMPRESSION: No acute pelvic fracture. Electronically Signed   By: Jeb Levering M.D.   On: 04/26/2018 01:44   Dg Elbow Complete Left  Result Date: 04/26/2018 CLINICAL DATA:  Post fall with bilateral upper extremity pain and skin tears. EXAM: LEFT ELBOW - COMPLETE 3+ VIEW COMPARISON:  None. FINDINGS: There is no evidence of fracture, dislocation, or joint effusion. There is no evidence of arthropathy or other focal bone abnormality. Prominent rounded soft tissue prominence overlying the olecranon likely hematoma in the setting of injury. IMPRESSION: Prominent rounded soft tissue prominence overlying the olecranon likely hematoma in the setting of injury. No fracture or elbow joint effusion. Electronically Signed   By: Jeb Levering M.D.   On: 04/26/2018 01:46   Dg Elbow Complete Right  Result Date: 04/26/2018 CLINICAL DATA:  Post fall with bilateral upper extremity pain and skin  tears. EXAM: RIGHT ELBOW - COMPLETE 3+ VIEW COMPARISON:  None. FINDINGS: There is no evidence of fracture, dislocation, or joint effusion. Mild spurring of the medial epicondyle. Mild focal soft tissue prominence overlies the olecranon. IMPRESSION: Soft tissue edema overlying the olecranon. No fracture, dislocation, or joint effusion. Electronically Signed   By: Jeb Levering M.D.   On: 04/26/2018 01:50   Dg Wrist Complete Left  Result Date: 04/26/2018 CLINICAL DATA:  Post fall with bilateral upper extremity pain and skin tears. EXAM: LEFT WRIST - COMPLETE 3+ VIEW COMPARISON:  None. FINDINGS: There is no evidence of fracture or dislocation. There is no evidence of arthropathy or other focal bone abnormality. Soft tissues are unremarkable. IMPRESSION: Negative radiographs of the left wrist. Electronically Signed   By: Jeb Levering M.D.   On: 04/26/2018 01:47   Dg Wrist Complete Right  Result Date: 04/26/2018 CLINICAL DATA:  Post fall with bilateral upper extremity pain and skin tears. EXAM: RIGHT WRIST - COMPLETE 3+ VIEW COMPARISON:  None. FINDINGS: There is no evidence of fracture or dislocation. There is no evidence of arthropathy or other focal bone abnormality. Soft tissues are unremarkable. IMPRESSION: Negative radiographs of the right wrist. Electronically Signed   By: Fonnie Birkenhead.D.  On: 04/26/2018 01:49   Ct Head Wo Contrast  Result Date: 04/26/2018 CLINICAL DATA:  Fall with facial tear to the nose EXAM: CT HEAD WITHOUT CONTRAST CT MAXILLOFACIAL WITHOUT CONTRAST CT CERVICAL SPINE WITHOUT CONTRAST TECHNIQUE: Multidetector CT imaging of the head, cervical spine, and maxillofacial structures were performed using the standard protocol without intravenous contrast. Multiplanar CT image reconstructions of the cervical spine and maxillofacial structures were also generated. COMPARISON:  CT 10/29/2014 FINDINGS: CT HEAD FINDINGS Brain: No acute territorial infarction, hemorrhage or  intracranial mass. Moderate atrophy. Moderate small vessel ischemic changes of the white matter. Stable ventricle size. Vascular: No hyperdense vessels. Vertebral and carotid vascular calcification Skull: No depressed skull fracture Other: Fluid in the inferior mastoid air cells. CT MAXILLOFACIAL FINDINGS Osseous: Mandibular heads are normally positioned. Trace effusion in the inferior mastoids. No mandibular fracture. Pterygoid plates and zygomatic arches are intact. Tiny fracture anterior nasal process of the maxilla. Acute comminuted and mildly displaced nasal bone fractures at the tip of the nose. Fracture mid nasal septum. Orbits: No orbital wall fracture. Extraocular muscles appear intact. The globes are within normal limits. Sinuses: Acute nondisplaced fracture posterolateral wall of the left maxillary sinus. Possible nondisplaced fracture posterior wall of right maxillary sinus. Hemorrhage within the maxillary and ethmoid sinuses. Debris within the sphenoid sinuses. Hemorrhage and debris within the nasal passages. Soft tissues: Soft tissue swelling over the nasal region and left upper lip. CT CERVICAL SPINE FINDINGS Alignment: Reversal of cervical lordosis. Trace anterolisthesis C3 on C4, unchanged. Facet alignment is normal. Skull base and vertebrae: No acute fracture. No primary bone lesion or focal pathologic process. Soft tissues and spinal canal: No prevertebral fluid or swelling. No visible canal hematoma. Disc levels: Moderate-to-marked degenerative changes C4-C5, C5-C6 and C6-C7 with mild-to-moderate degenerative changes at C3-C4. Posterior disc osteophyte complex at C4-C5, C5-C6 and C6-C7 with canal stenosis at these levels. Upper chest: Emphysema at the apices. 13 mm hypodense nodule right lobe of thyroid, slight enlargement. Carotid vascular calcification. Other: None IMPRESSION: 1. No definite CT evidence for acute intracranial abnormality. Atrophy and small vessel ischemic changes of the white  matter. 2. Reversal of cervical lordosis with trace anterolisthesis C3 on C4. No fracture is seen. 3. Acute mildly displaced and comminuted nasal bone fractures involving the tip of the nasal bones. Fracture through the nasal septum. Small fracture involving the anterior nasal process of the maxillary bone. 4. Acute nondisplaced fracture left posterior all lateral wall of left maxillary sinus with possible nondisplaced fracture of the right posterolateral wall of the right maxillary sinus. Hemorrhagic fluid within both maxillary sinuses. 5. Apical emphysema Electronically Signed   By: Donavan Foil M.D.   On: 04/26/2018 00:20   Ct Cervical Spine Wo Contrast  Result Date: 04/26/2018 CLINICAL DATA:  Fall with facial tear to the nose EXAM: CT HEAD WITHOUT CONTRAST CT MAXILLOFACIAL WITHOUT CONTRAST CT CERVICAL SPINE WITHOUT CONTRAST TECHNIQUE: Multidetector CT imaging of the head, cervical spine, and maxillofacial structures were performed using the standard protocol without intravenous contrast. Multiplanar CT image reconstructions of the cervical spine and maxillofacial structures were also generated. COMPARISON:  CT 10/29/2014 FINDINGS: CT HEAD FINDINGS Brain: No acute territorial infarction, hemorrhage or intracranial mass. Moderate atrophy. Moderate small vessel ischemic changes of the white matter. Stable ventricle size. Vascular: No hyperdense vessels. Vertebral and carotid vascular calcification Skull: No depressed skull fracture Other: Fluid in the inferior mastoid air cells. CT MAXILLOFACIAL FINDINGS Osseous: Mandibular heads are normally positioned. Trace effusion in the inferior mastoids. No  mandibular fracture. Pterygoid plates and zygomatic arches are intact. Tiny fracture anterior nasal process of the maxilla. Acute comminuted and mildly displaced nasal bone fractures at the tip of the nose. Fracture mid nasal septum. Orbits: No orbital wall fracture. Extraocular muscles appear intact. The globes are  within normal limits. Sinuses: Acute nondisplaced fracture posterolateral wall of the left maxillary sinus. Possible nondisplaced fracture posterior wall of right maxillary sinus. Hemorrhage within the maxillary and ethmoid sinuses. Debris within the sphenoid sinuses. Hemorrhage and debris within the nasal passages. Soft tissues: Soft tissue swelling over the nasal region and left upper lip. CT CERVICAL SPINE FINDINGS Alignment: Reversal of cervical lordosis. Trace anterolisthesis C3 on C4, unchanged. Facet alignment is normal. Skull base and vertebrae: No acute fracture. No primary bone lesion or focal pathologic process. Soft tissues and spinal canal: No prevertebral fluid or swelling. No visible canal hematoma. Disc levels: Moderate-to-marked degenerative changes C4-C5, C5-C6 and C6-C7 with mild-to-moderate degenerative changes at C3-C4. Posterior disc osteophyte complex at C4-C5, C5-C6 and C6-C7 with canal stenosis at these levels. Upper chest: Emphysema at the apices. 13 mm hypodense nodule right lobe of thyroid, slight enlargement. Carotid vascular calcification. Other: None IMPRESSION: 1. No definite CT evidence for acute intracranial abnormality. Atrophy and small vessel ischemic changes of the white matter. 2. Reversal of cervical lordosis with trace anterolisthesis C3 on C4. No fracture is seen. 3. Acute mildly displaced and comminuted nasal bone fractures involving the tip of the nasal bones. Fracture through the nasal septum. Small fracture involving the anterior nasal process of the maxillary bone. 4. Acute nondisplaced fracture left posterior all lateral wall of left maxillary sinus with possible nondisplaced fracture of the right posterolateral wall of the right maxillary sinus. Hemorrhagic fluid within both maxillary sinuses. 5. Apical emphysema Electronically Signed   By: Donavan Foil M.D.   On: 04/26/2018 00:20   Dg Hand Complete Left  Result Date: 04/26/2018 CLINICAL DATA:  Post fall with  bilateral upper extremity pain and skin tears. EXAM: LEFT HAND - COMPLETE 3+ VIEW COMPARISON:  None. FINDINGS: There is no evidence of fracture or dislocation. Mild osteoarthritis at the thumb carpal metacarpal joint. There is no evidence of arthropathy or other focal bone abnormality. Soft tissues are unremarkable. IMPRESSION: No fracture or acute osseous abnormality of the left hand. Electronically Signed   By: Jeb Levering M.D.   On: 04/26/2018 01:48   Dg Hand Complete Right  Result Date: 04/26/2018 CLINICAL DATA:  Post fall with bilateral upper extremity pain and skin tears. EXAM: RIGHT HAND - COMPLETE 3+ VIEW COMPARISON:  None. FINDINGS: There is no evidence of fracture or dislocation. Osteoarthritis at the thumb carpal metacarpal joint. There is no evidence of additional arthropathy or other focal bone abnormality. Soft tissues are unremarkable. IMPRESSION: No fracture or acute osseous abnormality of the right hand. Electronically Signed   By: Jeb Levering M.D.   On: 04/26/2018 01:49   Ct Maxillofacial Wo Contrast  Result Date: 04/26/2018 CLINICAL DATA:  Fall with facial tear to the nose EXAM: CT HEAD WITHOUT CONTRAST CT MAXILLOFACIAL WITHOUT CONTRAST CT CERVICAL SPINE WITHOUT CONTRAST TECHNIQUE: Multidetector CT imaging of the head, cervical spine, and maxillofacial structures were performed using the standard protocol without intravenous contrast. Multiplanar CT image reconstructions of the cervical spine and maxillofacial structures were also generated. COMPARISON:  CT 10/29/2014 FINDINGS: CT HEAD FINDINGS Brain: No acute territorial infarction, hemorrhage or intracranial mass. Moderate atrophy. Moderate small vessel ischemic changes of the white matter. Stable ventricle size.  Vascular: No hyperdense vessels. Vertebral and carotid vascular calcification Skull: No depressed skull fracture Other: Fluid in the inferior mastoid air cells. CT MAXILLOFACIAL FINDINGS Osseous: Mandibular heads are  normally positioned. Trace effusion in the inferior mastoids. No mandibular fracture. Pterygoid plates and zygomatic arches are intact. Tiny fracture anterior nasal process of the maxilla. Acute comminuted and mildly displaced nasal bone fractures at the tip of the nose. Fracture mid nasal septum. Orbits: No orbital wall fracture. Extraocular muscles appear intact. The globes are within normal limits. Sinuses: Acute nondisplaced fracture posterolateral wall of the left maxillary sinus. Possible nondisplaced fracture posterior wall of right maxillary sinus. Hemorrhage within the maxillary and ethmoid sinuses. Debris within the sphenoid sinuses. Hemorrhage and debris within the nasal passages. Soft tissues: Soft tissue swelling over the nasal region and left upper lip. CT CERVICAL SPINE FINDINGS Alignment: Reversal of cervical lordosis. Trace anterolisthesis C3 on C4, unchanged. Facet alignment is normal. Skull base and vertebrae: No acute fracture. No primary bone lesion or focal pathologic process. Soft tissues and spinal canal: No prevertebral fluid or swelling. No visible canal hematoma. Disc levels: Moderate-to-marked degenerative changes C4-C5, C5-C6 and C6-C7 with mild-to-moderate degenerative changes at C3-C4. Posterior disc osteophyte complex at C4-C5, C5-C6 and C6-C7 with canal stenosis at these levels. Upper chest: Emphysema at the apices. 13 mm hypodense nodule right lobe of thyroid, slight enlargement. Carotid vascular calcification. Other: None IMPRESSION: 1. No definite CT evidence for acute intracranial abnormality. Atrophy and small vessel ischemic changes of the white matter. 2. Reversal of cervical lordosis with trace anterolisthesis C3 on C4. No fracture is seen. 3. Acute mildly displaced and comminuted nasal bone fractures involving the tip of the nasal bones. Fracture through the nasal septum. Small fracture involving the anterior nasal process of the maxillary bone. 4. Acute nondisplaced  fracture left posterior all lateral wall of left maxillary sinus with possible nondisplaced fracture of the right posterolateral wall of the right maxillary sinus. Hemorrhagic fluid within both maxillary sinuses. 5. Apical emphysema Electronically Signed   By: Donavan Foil M.D.   On: 04/26/2018 00:20    Procedures Procedures (including critical care time)  Medications Ordered in ED Medications - No data to display   Initial Impression / Assessment and Plan / ED Course  I have reviewed the triage vital signs and the nursing notes.  Pertinent labs & imaging results that were available during my care of the patient were reviewed by me and considered in my medical decision making (see chart for details).    Fall while intoxicated, possible syncope.  Vitals are stable. Evidence of facial trauma bilateral arm trauma.  There is no chest pain or shortness of breath.   GCS 14.  ABCs intact. EKG is unchanged.  Denies chest pain or shortness of breath.  Work-up shows nasal bone fracture, nasal septum fracture, maxillary sinus fracture. Keflex started. Extremity x-rays are negative.  Labs show alcohol intoxication.  Suspect patient had fall in setting of alcohol abuse.  Cannot rule out syncope.  Patient lives alone and has multiple medical problems.  Patient also appears dry and is given IV fluids. Would likely benefit from observation admission in setting of possible syncope and alcohol abuse.  D/w Dr. Blaine Hamper.  Final Clinical Impressions(s) / ED Diagnoses   Final diagnoses:  Fall, initial encounter  Open fracture of nasal bone, initial encounter  Multiple skin tears  Alcoholic intoxication without complication [facility])    ED Discharge Orders    None  Ezequiel Essex, MD 04/26/18 [phone]

## 2018-04-25 NOTE — ED Triage Notes (Signed)
Per ems they were called out due to his life alarm going off. Pt stated he had been drinking and fell. He stated he did not LOC. Has skin tare to his left elbow and right arm. Facial tare on the nose and busted left nostril. Pt takes plavix.Pt was refusing to come but was talked into it from son. 120/78 72 hr 99 cbg, orthostatics 117/76 to 80/53.

## 2018-04-25 NOTE — ED Notes (Signed)
Patient transported to CT/xray 

## 2018-04-26 ENCOUNTER — Other Ambulatory Visit: Payer: Self-pay

## 2018-04-26 ENCOUNTER — Emergency Department (HOSPITAL_COMMUNITY): Payer: Medicare Other

## 2018-04-26 DIAGNOSIS — F1010 Alcohol abuse, uncomplicated: Secondary | ICD-10-CM

## 2018-04-26 DIAGNOSIS — Z8673 Personal history of transient ischemic attack (TIA), and cerebral infarction without residual deficits: Secondary | ICD-10-CM

## 2018-04-26 DIAGNOSIS — T148XXA Other injury of unspecified body region, initial encounter: Secondary | ICD-10-CM

## 2018-04-26 DIAGNOSIS — W19XXXA Unspecified fall, initial encounter: Secondary | ICD-10-CM | POA: Diagnosis present

## 2018-04-26 DIAGNOSIS — S02401A Maxillary fracture, unspecified, initial encounter for closed fracture: Secondary | ICD-10-CM | POA: Diagnosis present

## 2018-04-26 DIAGNOSIS — S022XXB Fracture of nasal bones, initial encounter for open fracture: Secondary | ICD-10-CM

## 2018-04-26 DIAGNOSIS — Z720 Tobacco use: Secondary | ICD-10-CM

## 2018-04-26 DIAGNOSIS — W19XXXD Unspecified fall, subsequent encounter: Secondary | ICD-10-CM

## 2018-04-26 DIAGNOSIS — K219 Gastro-esophageal reflux disease without esophagitis: Secondary | ICD-10-CM

## 2018-04-26 DIAGNOSIS — F10920 Alcohol use, unspecified with intoxication, uncomplicated: Secondary | ICD-10-CM

## 2018-04-26 DIAGNOSIS — S022XXA Fracture of nasal bones, initial encounter for closed fracture: Secondary | ICD-10-CM | POA: Diagnosis present

## 2018-04-26 DIAGNOSIS — J432 Centrilobular emphysema: Secondary | ICD-10-CM

## 2018-04-26 DIAGNOSIS — I5181 Takotsubo syndrome: Secondary | ICD-10-CM

## 2018-04-26 DIAGNOSIS — E785 Hyperlipidemia, unspecified: Secondary | ICD-10-CM

## 2018-04-26 LAB — BASIC METABOLIC PANEL
Anion gap: 12 (ref 5–15)
BUN: 11 mg/dL (ref 8–23)
CALCIUM: 8.5 mg/dL — AB (ref 8.9–10.3)
CO2: 23 mmol/L (ref 22–32)
CREATININE: 0.63 mg/dL (ref 0.61–1.24)
Chloride: 102 mmol/L (ref 98–111)
GFR calc non Af Amer: >60 mL/min (ref >60)
GLUCOSE: 77 mg/dL (ref 70–99)
Potassium: 3.6 mmol/L (ref 3.5–5.1)
Sodium: 137 mmol/L (ref 135–145)

## 2018-04-26 LAB — COMPREHENSIVE METABOLIC PANEL
ALBUMIN: 3.2 g/dL — AB (ref 3.5–5.0)
ALK PHOS: 155 U/L — AB (ref 38–126)
ALT: 22 U/L (ref 0–44)
ANION GAP: 12 (ref 5–15)
AST: 33 U/L (ref 15–41)
BILIRUBIN TOTAL: 0.4 mg/dL (ref 0.3–1.2)
BUN: 14 mg/dL (ref 8–23)
CALCIUM: 8.9 mg/dL (ref 8.9–10.3)
CO2: 26 mmol/L (ref 22–32)
Chloride: 98 mmol/L (ref 98–111)
Creatinine, Ser: 0.68 mg/dL (ref 0.61–1.24)
GFR calc Af Amer: >60 mL/min (ref >60)
GLUCOSE: 84 mg/dL (ref 70–99)
Potassium: 3.6 mmol/L (ref 3.5–5.1)
Sodium: 136 mmol/L (ref 135–145)
TOTAL PROTEIN: 6.9 g/dL (ref 6.5–8.1)

## 2018-04-26 LAB — CBC
HCT: 36.8 % — ABNORMAL LOW (ref 39.0–52.0)
Hemoglobin: 12.1 g/dL — ABNORMAL LOW (ref 13.0–17.0)
MCH: 34 pg (ref 26.0–34.0)
MCHC: 32.9 g/dL (ref 30.0–36.0)
MCV: 103.4 fL — ABNORMAL HIGH (ref 78.0–100.0)
PLATELETS: 270 10*3/uL (ref 150–400)
RBC: 3.56 MIL/uL — AB (ref 4.22–5.81)
RDW: 13.6 % (ref 11.5–15.5)
WBC: 8 10*3/uL (ref 4.0–10.5)

## 2018-04-26 LAB — BRAIN NATRIURETIC PEPTIDE: B NATRIURETIC PEPTIDE 5: 142.7 pg/mL — AB (ref 0.0–100.0)

## 2018-04-26 LAB — ETHANOL: Alcohol, Ethyl (B): 230 mg/dL — ABNORMAL HIGH (ref <10)

## 2018-04-26 LAB — TROPONIN I: Troponin I: <0.03 ng/mL (ref <0.03)

## 2018-04-26 MED ORDER — CLOPIDOGREL BISULFATE 75 MG PO TABS: 75.0000 mg | ORAL_TABLET | Freq: Every day | ORAL | Status: AC

## 2018-04-26 MED ORDER — DULOXETINE HCL 30 MG PO CPEP
30.0000 mg | ORAL_CAPSULE | Freq: Every day | ORAL | Status: DC
  Administered 2018-04-26: 30 mg via ORAL
  Filled 2018-04-26: qty 1

## 2018-04-26 MED ORDER — HYDROCODONE-ACETAMINOPHEN 5-325 MG PO TABS: 1.0000 | ORAL_TABLET | ORAL | Status: DC | PRN

## 2018-04-26 MED ORDER — ADULT MULTIVITAMIN W/MINERALS CH
1.0000 | ORAL_TABLET | Freq: Every day | ORAL | Status: DC
  Administered 2018-04-26: 1 via ORAL
  Filled 2018-04-26: qty 1

## 2018-04-26 MED ORDER — FOLIC ACID 1 MG PO TABS
1.0000 mg | ORAL_TABLET | Freq: Every day | ORAL | Status: DC
  Administered 2018-04-26: 1 mg via ORAL
  Filled 2018-04-26: qty 1

## 2018-04-26 MED ORDER — NICOTINE 21 MG/24HR TD PT24
21.0000 mg | MEDICATED_PATCH | Freq: Every day | TRANSDERMAL | Status: DC
  Administered 2018-04-26: 21 mg via TRANSDERMAL
  Filled 2018-04-26: qty 1

## 2018-04-26 MED ORDER — ONDANSETRON HCL 4 MG PO TABS: 4.0000 mg | ORAL_TABLET | Freq: Four times a day (QID) | ORAL | Status: DC | PRN

## 2018-04-26 MED ORDER — SALINE SPRAY 0.65 % NA SOLN
1.0000 | Freq: Four times a day (QID) | NASAL | Status: DC
  Administered 2018-04-26: 1 via NASAL
  Filled 2018-04-26 (×2): qty 44

## 2018-04-26 MED ORDER — METOPROLOL SUCCINATE ER 50 MG PO TB24
50.0000 mg | ORAL_TABLET | Freq: Every day | ORAL | Status: DC
  Administered 2018-04-26: 50 mg via ORAL
  Filled 2018-04-26: qty 1

## 2018-04-26 MED ORDER — ALBUTEROL SULFATE (2.5 MG/3ML) 0.083% IN NEBU: 2.5000 mg | INHALATION_SOLUTION | RESPIRATORY_TRACT | Status: DC | PRN

## 2018-04-26 MED ORDER — HYDRALAZINE HCL 20 MG/ML IJ SOLN
5.0000 mg | INTRAMUSCULAR | Status: DC | PRN
  Administered 2018-04-26: 5 mg via INTRAVENOUS
  Filled 2018-04-26: qty 1

## 2018-04-26 MED ORDER — CEPHALEXIN 500 MG PO CAPS
500.0000 mg | ORAL_CAPSULE | Freq: Three times a day (TID) | ORAL | Status: DC
  Administered 2018-04-26 (×2): 500 mg via ORAL
  Filled 2018-04-26: qty 1
  Filled 2018-04-26: qty 2
  Filled 2018-04-26: qty 1

## 2018-04-26 MED ORDER — ZOLPIDEM TARTRATE 5 MG PO TABS: 5.0000 mg | ORAL_TABLET | Freq: Every evening | ORAL | Status: DC | PRN

## 2018-04-26 MED ORDER — NITROGLYCERIN 0.4 MG SL SUBL: 0.4000 mg | SUBLINGUAL_TABLET | SUBLINGUAL | Status: DC | PRN

## 2018-04-26 MED ORDER — ATORVASTATIN CALCIUM 80 MG PO TABS
80.0000 mg | ORAL_TABLET | Freq: Every day | ORAL | Status: DC
  Administered 2018-04-26: 80 mg via ORAL
  Filled 2018-04-26: qty 1

## 2018-04-26 MED ORDER — ONDANSETRON HCL 4 MG/2ML IJ SOLN: 4.0000 mg | Freq: Four times a day (QID) | INTRAMUSCULAR | Status: DC | PRN

## 2018-04-26 MED ORDER — SENNOSIDES-DOCUSATE SODIUM 8.6-50 MG PO TABS: 1.0000 | ORAL_TABLET | Freq: Every evening | ORAL | Status: DC | PRN

## 2018-04-26 MED ORDER — THIAMINE HCL 100 MG/ML IJ SOLN: 100.0000 mg | Freq: Every day | INTRAMUSCULAR | Status: DC

## 2018-04-26 MED ORDER — SALINE SPRAY 0.65 % NA SOLN: 1.0000 | NASAL | 0 refills | Status: AC | PRN

## 2018-04-26 MED ORDER — LORAZEPAM 2 MG/ML IJ SOLN: 0.0000 mg | Freq: Two times a day (BID) | INTRAMUSCULAR | Status: DC

## 2018-04-26 MED ORDER — VITAMIN B-1 100 MG PO TABS
100.0000 mg | ORAL_TABLET | Freq: Every day | ORAL | Status: DC
  Administered 2018-04-26: 100 mg via ORAL
  Filled 2018-04-26: qty 1

## 2018-04-26 MED ORDER — CEPHALEXIN 500 MG PO CAPS: 500.0000 mg | ORAL_CAPSULE | Freq: Three times a day (TID) | ORAL | 0 refills | Status: DC

## 2018-04-26 MED ORDER — ARFORMOTEROL TARTRATE 15 MCG/2ML IN NEBU
15.0000 ug | INHALATION_SOLUTION | Freq: Two times a day (BID) | RESPIRATORY_TRACT | Status: DC
  Filled 2018-04-26: qty 2

## 2018-04-26 MED ORDER — MOMETASONE FURO-FORMOTEROL FUM 200-5 MCG/ACT IN AERO
2.0000 | INHALATION_SPRAY | Freq: Two times a day (BID) | RESPIRATORY_TRACT | Status: DC
  Filled 2018-04-26: qty 8.8

## 2018-04-26 MED ORDER — ACETAMINOPHEN 325 MG PO TABS
650.0000 mg | ORAL_TABLET | Freq: Four times a day (QID) | ORAL | Status: DC | PRN
  Administered 2018-04-26 (×2): 650 mg via ORAL
  Filled 2018-04-26 (×2): qty 2

## 2018-04-26 MED ORDER — TIOTROPIUM BROMIDE MONOHYDRATE 18 MCG IN CAPS
1.0000 | ORAL_CAPSULE | Freq: Every day | RESPIRATORY_TRACT | Status: DC
  Filled 2018-04-26: qty 5

## 2018-04-26 MED ORDER — LORAZEPAM 2 MG/ML IJ SOLN: 0.0000 mg | Freq: Four times a day (QID) | INTRAMUSCULAR | Status: DC

## 2018-04-26 MED ORDER — SODIUM CHLORIDE 0.9 % IV BOLUS
1000.0000 mL | Freq: Once | INTRAVENOUS | Status: AC
  Administered 2018-04-26: 1000 mL via INTRAVENOUS

## 2018-04-26 MED ORDER — LORAZEPAM 2 MG/ML IJ SOLN: 1.0000 mg | Freq: Four times a day (QID) | INTRAMUSCULAR | Status: DC | PRN

## 2018-04-26 MED ORDER — PANTOPRAZOLE SODIUM 40 MG PO TBEC
40.0000 mg | DELAYED_RELEASE_TABLET | Freq: Every day | ORAL | Status: DC
  Administered 2018-04-26: 40 mg via ORAL
  Filled 2018-04-26: qty 1

## 2018-04-26 MED ORDER — MIRABEGRON ER 50 MG PO TB24
50.0000 mg | ORAL_TABLET | Freq: Every day | ORAL | Status: DC
  Administered 2018-04-26: 50 mg via ORAL
  Filled 2018-04-26: qty 1

## 2018-04-26 MED ORDER — ESCITALOPRAM OXALATE 10 MG PO TABS
10.0000 mg | ORAL_TABLET | Freq: Every day | ORAL | Status: DC
  Administered 2018-04-26: 10 mg via ORAL
  Filled 2018-04-26: qty 1

## 2018-04-26 MED ORDER — LORAZEPAM 1 MG PO TABS: 1.0000 mg | ORAL_TABLET | Freq: Four times a day (QID) | ORAL | Status: DC | PRN

## 2018-04-26 MED ORDER — ACETAMINOPHEN 650 MG RE SUPP: 650.0000 mg | Freq: Four times a day (QID) | RECTAL | Status: DC | PRN

## 2018-04-26 NOTE — Care Management Note (Signed)
Case Management Note CM weekend coverage for 40M [phone]  Patient Details  Name: Brad Turner MRN: [id number] Date of Birth: 1941-08-09  Subjective/Objective:   PT presented with fall at home                 Action/Plan: PTA pt lived at home, hx of ETOH, orders placed for PT/OT [facility]- home safety eval. Spoke with pt at bedside who states he has been with [facility] at home in past- however [facility] unable to accept referral at this time- per pt choice will try [facility] this time- referral called to [facility] with [facility] for HHPT/OT- referral accepted.   Expected Discharge Date:  04/26/18               Expected Discharge Plan:  [place]  In-House Referral:  Clinical Social Work  Discharge planning Services  CM Consult  Post Acute Care Choice:  Home Health Choice offered to:  Patient  DME Arranged:    DME Agency:     HH Arranged:  PT, OT HH Agency:  [place]  Status of Service:  Completed, signed off  If discussed at [place] of Stay Meetings, dates discussed:    Discharge Disposition: home/home health   Additional Comments:  Dawayne Patricia, RN 04/26/2018, 4:16 PM

## 2018-04-26 NOTE — Consult Note (Signed)
[place] Nurse wound consult note Reason for Consult: Skin tears, full and partial thickness sustained during fall. Wound type: Trauma Pressure Injury POA: N/A Measurement: Left elbow avulsion (full thickness):  11cm x 9cm x 0.1cm. Irregular edges, attempted to roll out areas of epidermis that has been pushed to sides with limited success. Red, moist wound bed, small amount of serosanguinous exudate on old dressing. Bilateral hands at joints:  Partial thickness skin loss, dried serosanguinous exudate (scabbing). No drainage. Wound bed:As described above Drainage (amount, consistency, odor) As described above Periwound: intact with bruising Dressing procedure/placement/frequency: Bedside RN assisted with dressing removal to left elbow as it was adherent to wound. NS irrigation loosened dressings, but there is still minor discomfort voiced by patient. POC for left elbow will be to use a silicone wound contact layer (Mepitel) topped with dry gauze and secured with a few turns of Kerlix roll gauze/paper tape.  This can be changed every 3 days and is non adherent. For the smaller and more superficial areas of tissue loss on the bilateral hands, I will suggest applying betadine swabstick twice daily after NS, cleanse and allowing to air-dry.  This will provide both antimicrobial and astringent support and facilitate closure without complication.   [facility] nursing team will not follow, but will remain available to this patient, the nursing and medical teams.  Please re-consult if needed. Thanks, Maudie Flakes, MSN, RN, [place], Arther Abbott  Pager# [phone]

## 2018-04-26 NOTE — H&P (Signed)
History and Physical    Brad Turner IEP:[id number] DOB: Mar 26, 1941 DOA: 04/25/2018  Referring MD/NP/PA:   PCP: Haywood Pao, MD   Patient coming from:  The patient is coming from home.  At baseline, pt is independent for most of ADL.  Chief Complaint: fall  HPI: Brad Turner is a 77 y.o. male with medical history significant of hypertension, hyperlipidemia, COPD, stroke, GERD, depression, alcohol abuse, tobacco abuse, CAD, BPH, Takotsubo cardiomyopathy, gastric ulcer disease, who presents with fall.  Per report, EMS was called due to life alert going off.    Obviously patient had fall, has multiple skin tears, dry blood in his nose and month. Patient does not remember falling. He states that he drank couple of vodka last night, then does not remember what happened.  Patient denies dizziness, lightheadedness, unilateral weakness, numbness or tingling his extremities.  No facial droop or slurred speech.  He states that he has pain all over, particularly in both hands, elbows and nose bridge.  The pain is constant, severe, sharp. Patient denies chest pain, shortness breath, cough.  No fever or chills.  Denies nausea, vomiting, diarrhea or abdominal pain no symptoms of UTI.  Patient has multiple bruises in his arms and hands which he attributes to Plavix use.  ED Course: pt was found to have alcohol level 230, WBC 7.5, negative troponin, electrolytes renal function okay, temperature normal, no tachycardia, oxygen saturation 94% on room air.  Chest x-ray negative. x-ray for bilateral elbow, bilateral hands, pelvis and bilateral wrist are all negative for bony fracture.  CT head is negative for acute intracranial abnormalities.  CT of C-spine has no bony fracture.  Pt is placed on telemetry bed of observation.  CT of maxillofacial images showed 1. Acute mildly displaced and comminuted nasal bone fractures involving the tip of the nasal bones. Fracture through the nasal septum. Small fracture  involving the anterior nasal process of the maxillary bone. 4. Acute nondisplaced fracture left posterior all lateral wall of left maxillary sinus with possible nondisplaced fracture of the right posterolateral wall of the right maxillary sinus. Hemorrhagic fluid within both maxillary sinuses.  Review of Systems:   General: no fevers, chills, no body weight gain, has fatigue HEENT: no blurry vision, hearing changes or sore throat. Has nasal bridge pain.  Has nosebleeding. Respiratory: no dyspnea, coughing, wheezing CV: no chest pain, no palpitations GI: no nausea, vomiting, abdominal pain, diarrhea, constipation GU: no dysuria, burning on urination, increased urinary frequency, hematuria  Ext: no leg edema Neuro: no unilateral weakness, numbness, or tingling, no vision change or hearing loss. Had fall. Skin: Has multiple skin tears, bruises MSK: No muscle spasm, no deformity, no limitation of range of movement in spin Heme: No easy bruising.  Travel history: No recent long distant travel.  Allergy:  Allergies  Allergen Reactions  . Ivp Dye [Iodinated Diagnostic Agents] Hives and Shortness Of Breath    reaction was when pt was 77 years old.  Clancy Gourd [Nitrofurantoin] Other (See Comments)    Fever that lasted several months    Past Medical History:  Diagnosis Date  . Alcohol withdrawal ([place])   . Allergy    seasonal  . Arthritis   . Barrett esophagus   . BPH (benign prostatic hyperplasia)   . CAD (coronary artery disease)    a. s/p multiple POBA last in 2011 to distal OM2. b. Takotsubo event 09/2017 with cath showing no culprit, 25% prox LAD, 60% OM1, 65% OM2, EF 35-40%  by cath and 30-35% by echo.  . Chronic back pain   . COPD (chronic obstructive pulmonary disease) ([facility])    Albuterol inhaler as needed.Uses Symbicort daily  . Depression   . Emphysema lung ([place])   . Fall    fell on 10/06/16 with loss of consiousness for a short time.States he remembers getting up but  remembers nothing else.  . Gastric ulcer   . Gastritis   . GERD (gastroesophageal reflux disease)    takes Omeprazole daily  . History of colon polyps    benign  . History of kidney stones   . Hyperlipidemia   . Hypertension   . Hyponatremia   . 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 Neuropathy   . Nocturia   . Pneumonia    hx of  . PONV (postoperative nausea and vomiting)   . Skin cancer   . Stroke [facility]) 1995   Affected balance; and has very emotional if watching a movie  . Syncope    yr ago  . Takotsubo cardiomyopathy    a. 09/2017 - EF 35-40% by cath, 30-35% by echo.  . Urgency of urination    takes Myrbetriq daily    Past Surgical History:  Procedure Laterality Date  . APPENDECTOMY    . CARDIAC CATHETERIZATION     x 4. Most recent one in 2011 at [facility](3 previous were done 20 yrs before)  . cataracts Bilateral   . CHEST EXPLORATION Left 11/24/2017   Procedure: CHEST EXPLORATION;  Surgeon: [name], MD;  Location: [facility];  Service: Thoracic;  Laterality: Left;  . COLONOSCOPY    . CYSTOSCOPY  07/21/2013   Dr. Jeffie Pollock  . DENTAL SURGERY     with sedation  . ESOPHAGEAL MANOMETRY N/A 09/20/2015   Procedure: ESOPHAGEAL MANOMETRY (EM);  Surgeon: Manus Gunning, MD;  Location: [facility];  Service: Gastroenterology;  Laterality: N/A;  . LEFT HEART CATH AND CORONARY ANGIOGRAPHY N/A 09/15/2017   Procedure: LEFT HEART CATH AND CORONARY ANGIOGRAPHY;  Surgeon: Martinique, Peter M, MD;  Location: [place] [facility];  Service: Cardiovascular;  Laterality: N/A;  . LUMBAR LAMINECTOMY/DECOMPRESSION MICRODISCECTOMY N/A 01/12/2015   Procedure: LUMBAR LAMINECTOMY/DECOMPRESSION MICRODISCECTOMY 3 LEVELS;  Surgeon: Phylliss Bob, MD;  Location: [place];  Service: Orthopedics;  Laterality: N/A;  Lumbar 3-4, lumbar 4-5, lumbar 5-sacrum 1 decompression  . parathyroid adenoma removed    . POLYPECTOMY    . ROTATOR CUFF REPAIR     bilateral  .  TONSILLECTOMY    . UPPER GASTROINTESTINAL ENDOSCOPY    . VIDEO ASSISTED THORACOSCOPY (VATS)/EMPYEMA Left 11/24/2017   Procedure: VIDEO ASSISTED THORACOSCOPY (VATS)/EMPYEMA;  Surgeon: [name], MD;  Location: [facility];  Service: Thoracic;  Laterality: Left;  Marland Kitchen VIDEO BRONCHOSCOPY N/A 11/24/2017   Procedure: VIDEO BRONCHOSCOPY;  Surgeon: [name], MD;  Location: [facility] OR;  Service: Thoracic;  Laterality: N/A;    Social History:  reports that he has been smoking cigarettes. He has a 87.00 pack-year smoking history. He has never used smokeless tobacco. He reports that he drinks about 14.0 standard drinks of alcohol per week. He reports that he does not use drugs.  Family History:  Family History  Problem Relation Age of Onset  . Stroke Mother   . Rheum arthritis Mother   . Heart disease Father   . Colon cancer Unknown        ? mat. uncle died age 13  .  Colon polyps Neg Hx   . Esophageal cancer Neg Hx   . Rectal cancer Neg Hx   . Stomach cancer Neg Hx      Prior to Admission medications   Medication Sig Start Date End Date Taking? Authorizing Provider  acetaminophen (TYLENOL) 650 MG CR tablet Take 650 mg by mouth every 8 (eight) hours.     [provider]  albuterol (PROVENTIL HFA;VENTOLIN HFA) 108 (90 BASE) MCG/ACT inhaler Inhale 2 puffs into the lungs every 6 (six) hours as needed for wheezing or shortness of breath. Reported on 11/29/2015    [provider]  arformoterol (BROVANA) 15 MCG/2ML NEBU Take 2 mLs (15 mcg total) by nebulization 2 (two) times daily. 02/09/18   Noralee Space, MD  atorvastatin (LIPITOR) 80 MG tablet Take 80 mg by mouth daily.     [provider]  clopidogrel (PLAVIX) 75 MG tablet Take 75 mg by mouth daily.    [provider]  DULoxetine (CYMBALTA) 30 MG capsule Take 30 mg by mouth daily.    [provider]  folic acid (FOLVITE) 1 MG tablet Take 1 tablet (1 mg total) by mouth daily. 09/19/17   Raiford Noble  Latif, DO  guaiFENesin (MUCINEX) 600 MG 12 hr tablet Take 600 mg by mouth 2 (two) times daily as needed for cough or to loosen phlegm.     [provider]  metoprolol succinate (TOPROL-XL) 50 MG 24 hr tablet Take 1 tablet (50 mg total) by mouth daily. Take with or immediately following a meal. 09/19/17   Duke, Tami Lin, PA  MYRBETRIQ 50 MG TB24 tablet Take 50 mg by mouth daily.  05/03/15   [provider]  NITROSTAT 0.4 MG SL tablet Place 0.4 mg under the tongue every 5 (five) minutes as needed for chest pain. Reported on 11/29/2015 05/29/13   [provider]  Nutritional Supplements (NUTRITIONAL SUPPLEMENT PO) Take 1 each by mouth 2 (two) times daily. Health [employer]akes    [provider]  omeprazole (PRILOSEC) 40 MG capsule TAKE ONE CAPSULE BY MOUTH TWICE DAILY 04/22/16   Armbruster, Carlota Raspberry, MD  SYMBICORT 160-4.5 MCG/ACT inhaler INHALE TWO PUFFS INTO LUNGS TWICE DAILY 04/19/14   Clance, Armando Reichert, MD  Tiotropium Bromide Monohydrate (SPIRIVA RESPIMAT) 2.5 MCG/ACT AERS Inhale 2 puffs into the lungs. Gets from [facility]    [provider]    Physical Exam: Vitals:   04/25/18 2317 04/25/18 2318 04/26/18 0138 04/26/18 0200  BP: (!) 169/85  (!) 172/94 (!) 188/89  Pulse: 76  80 69  Resp:   20 13  Temp: 97.6 F (36.4 C)     TempSrc: Oral     SpO2: 95%  94% 96%  Weight:  54.4 kg    Height:  5\' 4"  (1.626 m)     General: Not in acute distress.  Dry mucous membrane HEENT:       Eyes: PERRL, EOMI, no scleral icterus.       ENT: no pharynx injection, no tonsillar enlargement.  Has nose bridge tenderness.  Has dry blood in his nose and mouth.       Neck: No JVD, no bruit, no mass felt. Heme: No neck lymph node enlargement. Cardiac: S1/S2, RRR, No murmurs, No gallops or rubs. Respiratory: No rales, wheezing, rhonchi or rubs. GI: Soft, nondistended, nontender, no rebound pain, no organomegaly, BS present. GU: No hematuria Ext: No pitting leg edema bilaterally.  2+DP/PT pulse bilaterally. Musculoskeletal: No joint deformities, No joint redness  or warmth, no limitation of ROM in spin. Skin: Has multiple skin tears and bruises in arms and elbows Neuro: Alert, oriented X3, cranial nerves II-XII grossly intact, moves all extremities normally.  Psych: Patient is not psychotic, no suicidal or hemocidal ideation.  Labs on Admission: I have personally reviewed following labs and imaging studies  CBC: Recent Labs  Lab 04/25/18 2326  WBC 7.5  NEUTROABS 3.4  HGB 12.5*  HCT 37.9*  MCV 102.7*  PLT 867   Basic Metabolic Panel: Recent Labs  Lab 04/25/18 2326  NA 136  K 3.6  CL 98  CO2 26  GLUCOSE 84  BUN 14  CREATININE 0.68  CALCIUM 8.9   GFR: Estimated Creatinine Clearance: 62.3 mL/min (by C-G formula based on SCr of 0.68 mg/dL). Liver Function Tests: Recent Labs  Lab 04/25/18 2326  AST 33  ALT 22  ALKPHOS 155*  BILITOT 0.4  PROT 6.9  ALBUMIN 3.2*   No results for input(s): LIPASE, AMYLASE in the last 168 hours. No results for input(s): AMMONIA in the last 168 hours. Coagulation Profile: No results for input(s): INR, PROTIME in the last 168 hours. Cardiac Enzymes: Recent Labs  Lab 04/25/18 2326  TROPONINI <0.03   BNP (last 3 results) No results for input(s): PROBNP in the last 8760 hours. HbA1C: No results for input(s): HGBA1C in the last 72 hours. CBG: No results for input(s): GLUCAP in the last 168 hours. Lipid Profile: No results for input(s): CHOL, HDL, LDLCALC, TRIG, CHOLHDL, LDLDIRECT in the last 72 hours. Thyroid Function Tests: No results for input(s): TSH, T4TOTAL, FREET4, T3FREE, THYROIDAB in the last 72 hours. Anemia Panel: No results for input(s): VITAMINB12, FOLATE, FERRITIN, TIBC, IRON, RETICCTPCT in the last 72 hours. Urine analysis:    Component Value Date/Time   COLORURINE AMBER (A) 12/07/2017 1659   APPEARANCEUR HAZY (A) 12/07/2017 1659   LABSPEC 1.024 12/07/2017 1659   PHURINE 5.0 12/07/2017 1659     GLUCOSEU NEGATIVE 12/07/2017 1659   HGBUR NEGATIVE 12/07/2017 1659   BILIRUBINUR NEGATIVE 12/07/2017 1659   KETONESUR NEGATIVE 12/07/2017 1659   PROTEINUR 30 (A) 12/07/2017 1659   UROBILINOGEN 1.0 01/09/2015 1427   NITRITE NEGATIVE 12/07/2017 1659   LEUKOCYTESUR TRACE (A) 12/07/2017 1659   Sepsis Labs: @LABRCNTIP (procalcitonin:4,lacticidven:4) )No results found for this or any previous visit (from the past 240 hour(s)).   Radiological Exams on Admission: Dg Chest 2 View  Result Date: 04/26/2018 CLINICAL DATA:  Post fall with bilateral upper extremity pain and skin tears. EXAM: CHEST - 2 VIEW COMPARISON:  Chest radiograph 03/12/2018.  Chest CT 12/07/2017 FINDINGS: Unchanged left pleural effusion and basilar atelectasis since prior exam. No new airspace disease. Chronic hyperinflation and emphysema. Unchanged heart size and mediastinal contours with aortic atherosclerosis. No pneumothorax. No acute osseous abnormalities. IMPRESSION: 1. No acute findings. 2. Unchanged left pleural effusion and basilar atelectasis from exam 6 weeks ago. 3. Aortic Atherosclerosis (ICD10-I70.0) and Emphysema (ICD10-J43.9). Electronically Signed   By: Jeb Levering M.D.   On: 04/26/2018 01:43   Dg Pelvis 1-2 Views  Result Date: 04/26/2018 CLINICAL DATA:  Post fall with bilateral upper extremity pain and skin tears. EXAM: PELVIS - 1-2 VIEW COMPARISON:  CT 12/07/2017. FINDINGS: The cortical margins of the bony pelvis are intact. No fracture. Pubic symphysis and sacroiliac joints are congruent. Both femoral heads are well-seated in the respective acetabula. Benign-appearing peripherally sclerotic lesion in the left intertrochanteric femur, unchanged. Postsurgical change in the lower lumbar spine. Avascular necrosis of the right femoral head.  Avascular necrosis on the left femoral head on prior CT not well seen radiographically. IMPRESSION: No acute pelvic fracture. Electronically Signed   By: Jeb Levering M.D.    On: 04/26/2018 01:44   Dg Elbow Complete Left  Result Date: 04/26/2018 CLINICAL DATA:  Post fall with bilateral upper extremity pain and skin tears. EXAM: LEFT ELBOW - COMPLETE 3+ VIEW COMPARISON:  None. FINDINGS: There is no evidence of fracture, dislocation, or joint effusion. There is no evidence of arthropathy or other focal bone abnormality. Prominent rounded soft tissue prominence overlying the olecranon likely hematoma in the setting of injury. IMPRESSION: Prominent rounded soft tissue prominence overlying the olecranon likely hematoma in the setting of injury. No fracture or elbow joint effusion. Electronically Signed   By: Jeb Levering M.D.   On: 04/26/2018 01:46   Dg Elbow Complete Right  Result Date: 04/26/2018 CLINICAL DATA:  Post fall with bilateral upper extremity pain and skin tears. EXAM: RIGHT ELBOW - COMPLETE 3+ VIEW COMPARISON:  None. FINDINGS: There is no evidence of fracture, dislocation, or joint effusion. Mild spurring of the medial epicondyle. Mild focal soft tissue prominence overlies the olecranon. IMPRESSION: Soft tissue edema overlying the olecranon. No fracture, dislocation, or joint effusion. Electronically Signed   By: Jeb Levering M.D.   On: 04/26/2018 01:50   Dg Wrist Complete Left  Result Date: 04/26/2018 CLINICAL DATA:  Post fall with bilateral upper extremity pain and skin tears. EXAM: LEFT WRIST - COMPLETE 3+ VIEW COMPARISON:  None. FINDINGS: There is no evidence of fracture or dislocation. There is no evidence of arthropathy or other focal bone abnormality. Soft tissues are unremarkable. IMPRESSION: Negative radiographs of the left wrist. Electronically Signed   By: Jeb Levering M.D.   On: 04/26/2018 01:47   Dg Wrist Complete Right  Result Date: 04/26/2018 CLINICAL DATA:  Post fall with bilateral upper extremity pain and skin tears. EXAM: RIGHT WRIST - COMPLETE 3+ VIEW COMPARISON:  None. FINDINGS: There is no evidence of fracture or dislocation. There  is no evidence of arthropathy or other focal bone abnormality. Soft tissues are unremarkable. IMPRESSION: Negative radiographs of the right wrist. Electronically Signed   By: Jeb Levering M.D.   On: 04/26/2018 01:49   Ct Head Wo Contrast  Result Date: 04/26/2018 CLINICAL DATA:  Fall with facial tear to the nose EXAM: CT HEAD WITHOUT CONTRAST CT MAXILLOFACIAL WITHOUT CONTRAST CT CERVICAL SPINE WITHOUT CONTRAST TECHNIQUE: Multidetector CT imaging of the head, cervical spine, and maxillofacial structures were performed using the standard protocol without intravenous contrast. Multiplanar CT image reconstructions of the cervical spine and maxillofacial structures were also generated. COMPARISON:  CT 10/29/2014 FINDINGS: CT HEAD FINDINGS Brain: No acute territorial infarction, hemorrhage or intracranial mass. Moderate atrophy. Moderate small vessel ischemic changes of the white matter. Stable ventricle size. Vascular: No hyperdense vessels. Vertebral and carotid vascular calcification Skull: No depressed skull fracture Other: Fluid in the inferior mastoid air cells. CT MAXILLOFACIAL FINDINGS Osseous: Mandibular heads are normally positioned. Trace effusion in the inferior mastoids. No mandibular fracture. Pterygoid plates and zygomatic arches are intact. Tiny fracture anterior nasal process of the maxilla. Acute comminuted and mildly displaced nasal bone fractures at the tip of the nose. Fracture mid nasal septum. Orbits: No orbital wall fracture. Extraocular muscles appear intact. The globes are within normal limits. Sinuses: Acute nondisplaced fracture posterolateral wall of the left maxillary sinus. Possible nondisplaced fracture posterior wall of right maxillary sinus. Hemorrhage within the maxillary and ethmoid sinuses. Debris within the  sphenoid sinuses. Hemorrhage and debris within the nasal passages. Soft tissues: Soft tissue swelling over the nasal region and left upper lip. CT CERVICAL SPINE FINDINGS  Alignment: Reversal of cervical lordosis. Trace anterolisthesis C3 on C4, unchanged. Facet alignment is normal. Skull base and vertebrae: No acute fracture. No primary bone lesion or focal pathologic process. Soft tissues and spinal canal: No prevertebral fluid or swelling. No visible canal hematoma. Disc levels: Moderate-to-marked degenerative changes C4-C5, C5-C6 and C6-C7 with mild-to-moderate degenerative changes at C3-C4. Posterior disc osteophyte complex at C4-C5, C5-C6 and C6-C7 with canal stenosis at these levels. Upper chest: Emphysema at the apices. 13 mm hypodense nodule right lobe of thyroid, slight enlargement. Carotid vascular calcification. Other: None IMPRESSION: 1. No definite CT evidence for acute intracranial abnormality. Atrophy and small vessel ischemic changes of the white matter. 2. Reversal of cervical lordosis with trace anterolisthesis C3 on C4. No fracture is seen. 3. Acute mildly displaced and comminuted nasal bone fractures involving the tip of the nasal bones. Fracture through the nasal septum. Small fracture involving the anterior nasal process of the maxillary bone. 4. Acute nondisplaced fracture left posterior all lateral wall of left maxillary sinus with possible nondisplaced fracture of the right posterolateral wall of the right maxillary sinus. Hemorrhagic fluid within both maxillary sinuses. 5. Apical emphysema Electronically Signed   By: Donavan Foil M.D.   On: 04/26/2018 00:20   Ct Cervical Spine Wo Contrast  Result Date: 04/26/2018 CLINICAL DATA:  Fall with facial tear to the nose EXAM: CT HEAD WITHOUT CONTRAST CT MAXILLOFACIAL WITHOUT CONTRAST CT CERVICAL SPINE WITHOUT CONTRAST TECHNIQUE: Multidetector CT imaging of the head, cervical spine, and maxillofacial structures were performed using the standard protocol without intravenous contrast. Multiplanar CT image reconstructions of the cervical spine and maxillofacial structures were also generated. COMPARISON:  CT  10/29/2014 FINDINGS: CT HEAD FINDINGS Brain: No acute territorial infarction, hemorrhage or intracranial mass. Moderate atrophy. Moderate small vessel ischemic changes of the white matter. Stable ventricle size. Vascular: No hyperdense vessels. Vertebral and carotid vascular calcification Skull: No depressed skull fracture Other: Fluid in the inferior mastoid air cells. CT MAXILLOFACIAL FINDINGS Osseous: Mandibular heads are normally positioned. Trace effusion in the inferior mastoids. No mandibular fracture. Pterygoid plates and zygomatic arches are intact. Tiny fracture anterior nasal process of the maxilla. Acute comminuted and mildly displaced nasal bone fractures at the tip of the nose. Fracture mid nasal septum. Orbits: No orbital wall fracture. Extraocular muscles appear intact. The globes are within normal limits. Sinuses: Acute nondisplaced fracture posterolateral wall of the left maxillary sinus. Possible nondisplaced fracture posterior wall of right maxillary sinus. Hemorrhage within the maxillary and ethmoid sinuses. Debris within the sphenoid sinuses. Hemorrhage and debris within the nasal passages. Soft tissues: Soft tissue swelling over the nasal region and left upper lip. CT CERVICAL SPINE FINDINGS Alignment: Reversal of cervical lordosis. Trace anterolisthesis C3 on C4, unchanged. Facet alignment is normal. Skull base and vertebrae: No acute fracture. No primary bone lesion or focal pathologic process. Soft tissues and spinal canal: No prevertebral fluid or swelling. No visible canal hematoma. Disc levels: Moderate-to-marked degenerative changes C4-C5, C5-C6 and C6-C7 with mild-to-moderate degenerative changes at C3-C4. Posterior disc osteophyte complex at C4-C5, C5-C6 and C6-C7 with canal stenosis at these levels. Upper chest: Emphysema at the apices. 13 mm hypodense nodule right lobe of thyroid, slight enlargement. Carotid vascular calcification. Other: None IMPRESSION: 1. No definite CT evidence  for acute intracranial abnormality. Atrophy and small vessel ischemic changes of the white  matter. 2. Reversal of cervical lordosis with trace anterolisthesis C3 on C4. No fracture is seen. 3. Acute mildly displaced and comminuted nasal bone fractures involving the tip of the nasal bones. Fracture through the nasal septum. Small fracture involving the anterior nasal process of the maxillary bone. 4. Acute nondisplaced fracture left posterior all lateral wall of left maxillary sinus with possible nondisplaced fracture of the right posterolateral wall of the right maxillary sinus. Hemorrhagic fluid within both maxillary sinuses. 5. Apical emphysema Electronically Signed   By: Donavan Foil M.D.   On: 04/26/2018 00:20   Dg Hand Complete Left  Result Date: 04/26/2018 CLINICAL DATA:  Post fall with bilateral upper extremity pain and skin tears. EXAM: LEFT HAND - COMPLETE 3+ VIEW COMPARISON:  None. FINDINGS: There is no evidence of fracture or dislocation. Mild osteoarthritis at the thumb carpal metacarpal joint. There is no evidence of arthropathy or other focal bone abnormality. Soft tissues are unremarkable. IMPRESSION: No fracture or acute osseous abnormality of the left hand. Electronically Signed   By: Jeb Levering M.D.   On: 04/26/2018 01:48   Dg Hand Complete Right  Result Date: 04/26/2018 CLINICAL DATA:  Post fall with bilateral upper extremity pain and skin tears. EXAM: RIGHT HAND - COMPLETE 3+ VIEW COMPARISON:  None. FINDINGS: There is no evidence of fracture or dislocation. Osteoarthritis at the thumb carpal metacarpal joint. There is no evidence of additional arthropathy or other focal bone abnormality. Soft tissues are unremarkable. IMPRESSION: No fracture or acute osseous abnormality of the right hand. Electronically Signed   By: Jeb Levering M.D.   On: 04/26/2018 01:49   Ct Maxillofacial Wo Contrast  Result Date: 04/26/2018 CLINICAL DATA:  Fall with facial tear to the nose EXAM: CT HEAD  WITHOUT CONTRAST CT MAXILLOFACIAL WITHOUT CONTRAST CT CERVICAL SPINE WITHOUT CONTRAST TECHNIQUE: Multidetector CT imaging of the head, cervical spine, and maxillofacial structures were performed using the standard protocol without intravenous contrast. Multiplanar CT image reconstructions of the cervical spine and maxillofacial structures were also generated. COMPARISON:  CT 10/29/2014 FINDINGS: CT HEAD FINDINGS Brain: No acute territorial infarction, hemorrhage or intracranial mass. Moderate atrophy. Moderate small vessel ischemic changes of the white matter. Stable ventricle size. Vascular: No hyperdense vessels. Vertebral and carotid vascular calcification Skull: No depressed skull fracture Other: Fluid in the inferior mastoid air cells. CT MAXILLOFACIAL FINDINGS Osseous: Mandibular heads are normally positioned. Trace effusion in the inferior mastoids. No mandibular fracture. Pterygoid plates and zygomatic arches are intact. Tiny fracture anterior nasal process of the maxilla. Acute comminuted and mildly displaced nasal bone fractures at the tip of the nose. Fracture mid nasal septum. Orbits: No orbital wall fracture. Extraocular muscles appear intact. The globes are within normal limits. Sinuses: Acute nondisplaced fracture posterolateral wall of the left maxillary sinus. Possible nondisplaced fracture posterior wall of right maxillary sinus. Hemorrhage within the maxillary and ethmoid sinuses. Debris within the sphenoid sinuses. Hemorrhage and debris within the nasal passages. Soft tissues: Soft tissue swelling over the nasal region and left upper lip. CT CERVICAL SPINE FINDINGS Alignment: Reversal of cervical lordosis. Trace anterolisthesis C3 on C4, unchanged. Facet alignment is normal. Skull base and vertebrae: No acute fracture. No primary bone lesion or focal pathologic process. Soft tissues and spinal canal: No prevertebral fluid or swelling. No visible canal hematoma. Disc levels: Moderate-to-marked  degenerative changes C4-C5, C5-C6 and C6-C7 with mild-to-moderate degenerative changes at C3-C4. Posterior disc osteophyte complex at C4-C5, C5-C6 and C6-C7 with canal stenosis at these levels. Upper chest:  Emphysema at the apices. 13 mm hypodense nodule right lobe of thyroid, slight enlargement. Carotid vascular calcification. Other: None IMPRESSION: 1. No definite CT evidence for acute intracranial abnormality. Atrophy and small vessel ischemic changes of the white matter. 2. Reversal of cervical lordosis with trace anterolisthesis C3 on C4. No fracture is seen. 3. Acute mildly displaced and comminuted nasal bone fractures involving the tip of the nasal bones. Fracture through the nasal septum. Small fracture involving the anterior nasal process of the maxillary bone. 4. Acute nondisplaced fracture left posterior all lateral wall of left maxillary sinus with possible nondisplaced fracture of the right posterolateral wall of the right maxillary sinus. Hemorrhagic fluid within both maxillary sinuses. 5. Apical emphysema Electronically Signed   By: Donavan Foil M.D.   On: 04/26/2018 00:20     EKG: Independently reviewed.  Sinus rhythm, QTC 455, Q waves in lead I/aVL.    Assessment/Plan Principal Problem:   Fall Active Problems:   HLD (hyperlipidemia)   COPD (chronic obstructive pulmonary disease) with emphysema (HCC)   GERD   Takotsubo cardiomyopathy   Tobacco abuse   Depression   History of CVA (cerebrovascular accident)   ETOH abuse   Nasal bone fracture   Maxillary sinus fracture ([place])   Fall: Most likely due to drunk after drinking couple of vodka.  Alcohol level 230.  No focal neurologic findings on physical examination.  Low suspicion for stroke or TIA.  Patient is clinically dry.  -Placed on telemetry bed of observation -PT/OT -Check orthostatic status -IV fluid: 1 L normal saline were ordered by EDP  Nasal bone fracture, Maxillary sinus fracture and multiple skin: -Start  Keflex prophylaxis -PRN Norco for pain -Wound care consult -May need to discuss with ENT in the morning  HLD (hyperlipidemia): -Lipitor  COPD (chronic obstructive pulmonary disease) with emphysema ([place]): stable -Bronchodilators  GERD: -Protonix  Takotsubo cardiomyopathy: 2D echo 09/15/2017 showed EF of 30-35%.  Patient does not have leg edema  Or JVD.  No respiratory distress.  CHF seems to be compensated.  Patient is clinically dry. -Continue metoprolol -Check BMP  Tobacco abuse and Alcohol abuse: -Did counseling about importance of quitting smoking and drinking alcohol -Nicotine patch -CIWA protocol  History of CVA (cerebrovascular accident): -hold Plavix now, may resume tomorrow  Depression: -Continue home medications   DVT ppx: SCD Code Status: Full code Family Communication: None at bed side.    Disposition Plan:  Anticipate discharge back to previous home environment Consults called:  none Admission status: Obs / tele    Date of Service 04/26/2018    Ivor Costa [facility] Pager [phone]  If 7PM-7AM, please contact night-coverage [email or web address] Password [email or web address] 04/26/2018, 3:30 AM

## 2018-04-26 NOTE — ED Notes (Signed)
Tech collecting labs. 

## 2018-04-26 NOTE — Progress Notes (Signed)
Large skin tear on top of his left a/c extending to his elbow,cleanse with NS ,Mepitel dressing applied with 4x4 gauze and soft Kerlix wrap around on his left elbow.Patient instructed this dressing will stay 3-5 days.Rest of the wounds especially at the nose bridge were cleansed with betadine swab and let it air dry.Extra Mepitel dressing and Betadine swabs given to patient and instructions given to him and his son

## 2018-04-26 NOTE — Evaluation (Signed)
Physical Therapy Evaluation Patient Details Name: Brad Turner MRN: [id number] DOB: 15-Apr-1941 Today's Date: 04/26/2018   History of Present Illness  Pt is a 77 y/o male admitted on 04/25/18 after life alert alerting EMS of mechanical fall.  Pt reports drinking vodka, but does not remember what happened.  CT shows acute nasal bone fractures and acute maximally sinus fractures, skin tears noted on B hands elbow and face.  PMH siginficant for but not limited to: hypertension, hyperlipidemia, COPD, stroke, GERD, depression, alcohol abuse, tobacco abuse, CAD, BPH, Takotsubo cardiomyopathy, gastric ulcer disease, macular degeneration, B rotator cuff repair, lumbar surgery.   Clinical Impression  Pt admitted with above diagnosis. Pt currently with functional limitations due to the deficits listed below (see PT Problem List). Presents with gait and balance dysfunction, incr fall risk; upon discussion, it is not likely that pt will have 24 hour assist available to him at home; Rec SW consult -- help consider alcohol rehab options;  Pt will benefit from skilled PT to increase their independence and safety with mobility to allow discharge to the venue listed below.  Discussed pt with Case Mgr     Follow Up Recommendations Supervision/Assistance - 24 hour; worth considering HHPT for home safety eval, especially given difficulty with bilateral hands/UEs    Equipment Recommendations  None recommended by PT    Recommendations for Other Services       Precautions / Restrictions Precautions Precautions: Fall;Other (comment) Precaution Comments: hypersensitivity B hands, skin tears hands, elbow (L) Restrictions Weight Bearing Restrictions: No      Mobility  Bed Mobility Overal bed mobility: Needs Assistance Bed Mobility: Supine to Sit     Supine to sit: Supervision     General bed mobility comments: for safety  Transfers Overall transfer level: Needs assistance Equipment used: None Transfers:  Sit to/from Stand Sit to Stand: Supervision         General transfer comment: supervision for safety   Ambulation/Gait Ambulation/Gait assistance: Min guard Gait Distance (Feet): 150 Feet Assistive device: None(and occasional use of hallway rail) Gait Pattern/deviations: Step-through pattern;Drifts right/left Gait velocity: slowed   General Gait Details: Cues to self-monitor for activity tolerance; occasional drift R and L with use of hallway rail  Stairs Stairs: Yes Stairs assistance: Min guard Stair Management: Two rails;Alternating pattern;Forwards Number of Stairs: 6 General stair comments: Noted hard footfall withdecr control when stepping down with LLE leading  Wheelchair Mobility    Modified Rankin (Stroke Patients Only)       Balance Overall balance assessment: Mild deficits observed, not formally tested                                           Pertinent Vitals/Pain Pain Assessment: Faces Faces Pain Scale: Hurts even more Pain Location: B hands  Pain Descriptors / Indicators: Burning;Constant;Aching;Discomfort;Guarding;Grimacing Pain Intervention(s): Monitored during session    Home Living Family/patient expects to be discharged to:: Private residence Living Arrangements: Alone   Type of Home: House Home Access: Stairs to enter Entrance Stairs-Rails: None Entrance Stairs-Number of Steps: 1 Home Layout: Two level;Bed/bath upstairs;1/2 bath on main level Home Equipment: Walker - 2 wheels;Shower seat      Prior Function Level of Independence: Independent         Comments: independent ADLs, reports able to complete limited IADLs, uses RW for commuinty mobility     Hand Dominance  Dominant Hand: Right    Extremity/Trunk Assessment   Upper Extremity Assessment Upper Extremity Assessment: Defer to OT evaluation RUE Deficits / Details: hypersensitive distally (hands), limited grasp d/t pain  RUE Sensation: WNL RUE  Coordination: decreased fine motor LUE Deficits / Details: hypersensitive distally (hands), limited grasp d/t pain  LUE Sensation: WNL LUE Coordination: decreased fine motor    Lower Extremity Assessment Lower Extremity Assessment: Generalized weakness(Noted RLE grossly weaker than L on stairs)       Communication   Communication: No difficulties  Cognition Arousal/Alertness: Awake/alert Behavior During Therapy: WFL for tasks assessed/performed Overall Cognitive Status: No family/caregiver present to determine baseline cognitive functioning Area of Impairment: Attention;Following commands;Problem solving;Safety/judgement                   Current Attention Level: Selective   Following Commands: Follows multi-step commands inconsistently;Follows multi-step commands with increased time Safety/Judgement: Decreased awareness of deficits;Decreased awareness of safety   Problem Solving: Decreased initiation General Comments: increased time for processing and cueing for safety awareness      General Comments General comments (skin integrity, edema, etc.): bruising and skin tears bil hands    Exercises     Assessment/Plan    PT Assessment Patient needs continued PT services  PT Problem List Decreased strength;Decreased activity tolerance;Decreased balance       PT Treatment Interventions DME instruction;Gait training;Stair training;Functional mobility training;Therapeutic activities;Therapeutic exercise;Balance training;Cognitive remediation    PT Goals (Current goals can be found in the Care Plan section)  Acute Rehab PT Goals Patient Stated Goal: home  PT Goal Formulation: With patient Time For Goal Achievement: 05/10/18 Potential to Achieve Goals: Good    Frequency Min 3X/week   Barriers to discharge Decreased caregiver support      Co-evaluation               AM-PAC PT "6 Clicks" Daily Activity  Outcome Measure Difficulty turning over in bed  (including adjusting bedclothes, sheets and blankets)?: A Little Difficulty moving from lying on back to sitting on the side of the bed? : A Little Difficulty sitting down on and standing up from a chair with arms (e.g., wheelchair, bedside commode, etc,.)?: A Little Help needed moving to and from a bed to chair (including a wheelchair)?: A Little Help needed walking in hospital room?: A Little Help needed climbing 3-5 steps with a railing? : A Little 6 Click Score: 18    End of Session Equipment Utilized During Treatment: Gait belt Activity Tolerance: Patient tolerated treatment well Patient left: in bed;with call bell/phone within reach;with bed alarm set(sitting EOB) Nurse Communication: Mobility status PT Visit Diagnosis: Unsteadiness on feet (R26.81);Other abnormalities of gait and mobility (R26.89)    Time: 1212-1226 PT Time Calculation (min) (ACUTE ONLY): 14 min   Charges:   PT Evaluation $PT Eval Low Complexity: Start, PT  Acute Rehabilitation Services Pager [phone] Office [place] 04/26/2018, 1:30 PM

## 2018-04-26 NOTE — Evaluation (Addendum)
Occupational Therapy Evaluation Patient Details Name: Brad Turner MRN: [id number] DOB: 23-Jul-1941 Today's Date: 04/26/2018    History of Present Illness Pt is a 77 y/o male admitted on 04/25/18 after life alert alerting EMS of mechanical fall.  Pt reports drinking vodka, but does not remember what happened.  CT shows acute nasal bone fractures and acute maximally sinus fractures, skin tears noted on B hands elbow and face.  PMH siginficant for but not limited to: hypertension, hyperlipidemia, COPD, stroke, GERD, depression, alcohol abuse, tobacco abuse, CAD, BPH, Takotsubo cardiomyopathy, gastric ulcer disease, macular degeneration, B rotator cuff repair, lumbar surgery.    Clinical Impression   PTA patient reports independent with ADLs, mobility (using RW for commuinty mobility), limited IADLs.  He currently requires setup assist for UB ADL, min guard assist for LB ADL, min guard for toilet transfers, min guard for short distance mobility, and supervision for toileting.  He is limited by B UE hypersensitivity (hands), impaired balance, and decreased problem solving/safety awareness.  Short blessed test completed with no impairments noted in short term memory or sequencing, increased time required. He will benefit from continued OT services while admitted in order to maximize safety and independence prior to dc home.  Recommend 24/7 assistance but patient is unsure if this is possible, as his son works but reports his brother will be arriving on Tuesday.  Recommend HHOT for home safety evaluation at dc. Will continue to follow and update recommendations as needed.     Follow Up Recommendations  Supervision/Assistance - 24 hour;Home health OT(home safety evaluation )    Equipment Recommendations  Tub/shower seat    Recommendations for Other Services PT consult     Precautions / Restrictions Precautions Precautions: Fall;Other (comment) Precaution Comments: hypersensitivity B hands, skin tears  hands, elbow (L) Restrictions Weight Bearing Restrictions: No      Mobility Bed Mobility Overal bed mobility: Needs Assistance Bed Mobility: Supine to Sit     Supine to sit: Supervision     General bed mobility comments: for safety  Transfers Overall transfer level: Needs assistance Equipment used: None Transfers: Sit to/from Stand Sit to Stand: Supervision         General transfer comment: supervision for safety     Balance Overall balance assessment: Mild deficits observed, not formally tested                                         ADL either performed or assessed with clinical judgement   ADL Overall ADL's : Needs assistance/impaired     Grooming: Supervision/safety;Sitting   Upper Body Bathing: Supervision/ safety;Sitting   Lower Body Bathing: Supervison/ safety;Sit to/from stand   Upper Body Dressing : Set up;Sitting   Lower Body Dressing: Min guard;Sit to/from stand Lower Body Dressing Details (indicate cue type and reason): loss of balance backwards while donning shoes sitting EOB Toilet Transfer: Min guard;Ambulation(simulated to recliner ) Toilet Transfer Details (indicate cue type and reason): decreased control during descend to chair, cueing for safety  Toileting- Clothing Manipulation and Hygiene: Supervision/safety;Sit to/from stand       Functional mobility during ADLs: Min guard;Cueing for safety General ADL Comments: patient presents with unsteayness during mobility and ADLs      Vision   Vision Assessment?: No apparent visual deficits     Perception     Praxis      Pertinent Vitals/Pain Pain Assessment:  Faces Faces Pain Scale: Hurts even more Pain Location: B hands  Pain Descriptors / Indicators: Burning;Constant;Aching;Discomfort;Guarding;Grimacing Pain Intervention(s): Limited activity within patient's tolerance;Repositioned     Hand Dominance Right   Extremity/Trunk Assessment Upper Extremity  Assessment Upper Extremity Assessment: Generalized weakness;RUE deficits/detail;LUE deficits/detail RUE Deficits / Details: hypersensitive distally (hands), limited grasp d/t pain  RUE Sensation: WNL RUE Coordination: decreased fine motor LUE Deficits / Details: hypersensitive distally (hands), limited grasp d/t pain  LUE Sensation: WNL LUE Coordination: decreased fine motor   Lower Extremity Assessment Lower Extremity Assessment: Defer to PT evaluation       Communication Communication Communication: No difficulties   Cognition Arousal/Alertness: Awake/alert Behavior During Therapy: WFL for tasks assessed/performed Overall Cognitive Status: No family/caregiver present to determine baseline cognitive functioning Area of Impairment: Attention;Following commands;Problem solving;Safety/judgement                   Current Attention Level: Selective   Following Commands: Follows multi-step commands inconsistently;Follows multi-step commands with increased time Safety/Judgement: Decreased awareness of deficits;Decreased awareness of safety   Problem Solving: Slow processing;Decreased initiation;Difficulty sequencing;Requires tactile cues;Requires verbal cues General Comments: increased time for processing and cueing for safety awareness   General Comments       Exercises     Shoulder Instructions      Home Living Family/patient expects to be discharged to:: Private residence Living Arrangements: Alone   Type of Home: House Home Access: Stairs to enter [employer] of Steps: 1 Entrance Stairs-Rails: None Home Layout: Two level;Bed/bath upstairs;1/2 bath on main level Alternate Level Stairs-Number of Steps: 14 Alternate Level Stairs-Rails: Right;Left Bathroom Shower/Tub: Teacher, early years/pre: Standard     Home Equipment: Environmental consultant - 2 wheels;Shower seat          Prior Functioning/Environment Level of Independence: Independent         Comments: independent ADLs, reports able to complete limited IADLs, uses RW for commuinty mobility        OT Problem List: Decreased strength;Decreased activity tolerance;Impaired balance (sitting and/or standing);Decreased safety awareness;Pain;Impaired sensation      OT Treatment/Interventions: Self-care/ADL training;Balance training;Therapeutic activities;DME and/or AE instruction;Patient/family education    OT Goals(Current goals can be found in the care plan section) Acute Rehab OT Goals Patient Stated Goal: home  OT Goal Formulation: With patient Time For Goal Achievement: 05/10/18 Potential to Achieve Goals: Good  OT Frequency: Min 2X/week   Barriers to D/C:            Co-evaluation              AM-PAC PT "6 Clicks" Daily Activity     Outcome Measure Help from another person eating meals?: None Help from another person taking care of personal grooming?: A Little Help from another person toileting, which includes using toliet, bedpan, or urinal?: None Help from another person bathing (including washing, rinsing, drying)?: A Little Help from another person to put on and taking off regular upper body clothing?: None Help from another person to put on and taking off regular lower body clothing?: A Little 6 Click Score: 21   End of Session Equipment Utilized During Treatment: Gait belt Nurse Communication: Mobility status;Other (comment)(pt needs)  Activity Tolerance: Patient tolerated treatment well Patient left: in chair;with family/visitor present;with chair alarm set  OT Visit Diagnosis: Unsteadiness on feet (R26.81);History of falling (Z91.81);Pain Pain - Right/Left: (bilaterally) Pain - part of body: Hand  Time: 7737-3668 OT Time Calculation (min): 22 min Charges:  OT General Charges $OT Visit: 1 Visit OT Evaluation $OT Eval Moderate Complexity: 1 [address] OTR/L  Pager [place] 04/26/2018, 1:20 PM

## 2018-04-26 NOTE — Progress Notes (Signed)
New Admission Note:  Arrival Method: By bed from ED around 0400 Mental Orientation: Alert and oriented Telemetry: Box 2, CCMD notified Assessment: Completed Skin: Completed, refer to flowsheets IV: Right A.C. Pain: Denies Tubes: None Safety Measures: Safety Fall Prevention Plan was given, discussed  Admission: Completed [facility] Orientation: Patient has been orientated to the room, unit and the staff. Family: none  Orders have been reviewed and implemented. Will continue to monitor the patient. Call light has been placed within reach and bed alarm has been activated.   Perry Mount, RN  Phone Number: [phone]

## 2018-04-26 NOTE — Discharge Summary (Signed)
Physician Discharge Summary  Brad Turner WUJ:[id number] DOB: Jun 16, 1941 DOA: 04/25/2018  PCP: Haywood Pao, MD  Admit date: 04/25/2018 Discharge date: 04/26/2018  Time spent: 35 minutes  Recommendations for Outpatient Follow-up:  1. PCP in 1 week 2. ENT Dr.Shoemaker in 1 week   Discharge Diagnoses:  Principal Problem:   Fall   HLD (hyperlipidemia)   COPD (chronic obstructive pulmonary disease) with emphysema (HCC)   GERD   Takotsubo cardiomyopathy   Tobacco abuse   Depression   History of CVA (cerebrovascular accident)   ETOH abuse   Nasal bone fracture   Maxillary sinus fracture (HCC)   Alcoholic intoxication without complication ([place])   Discharge Condition: stable  Diet recommendation: heart healthy  Filed Weights   04/25/18 2318  Weight: 54.4 kg    History of present illness:  Brad Turner is a 77 y.o. male with medical history significant of hypertension, hyperlipidemia, COPD, stroke, GERD, depression, alcohol abuse, tobacco abuse, CAD, BPH, Takotsubo cardiomyopathy, gastric ulcer disease, who presented with fall.  Hospital Course:   Fall/Syncope -Most likely due to ETOH intoxication, Alcohol level 230 on admission -hydrated with saline -no events on Tele -PT eval completed  Nasal bone fracture, Maxillary sinus fracture and multiple skin tears -due to fall, discussed with the ENT Dr.Shoemaker reviewed maxillofacial CT and felt that all his fractures were nondisplaced, recommended supportive care for now and follow-up in office in 1 week -Advised the patient to hold Plavix until ENT follow-up -Saline nasal spray  HLD (hyperlipidemia): -Lipitor  COPD (chronic obstructive pulmonary disease) with emphysema ([place]): -stable -Bronchodilators  GERD: -Protonix  Takotsubo cardiomyopathy:  -2D echo 09/15/2017 showed EF of 30-35%. -Clinically euvolemic -continue metoprolol  Tobacco abuse and Alcohol abuse: -Did counseling about importance of  quitting smoking and drinking alcohol  History of CVA (cerebrovascular accident): -resume plavix after FU with Dr.ENT Dr.Shoemaker  Depression: -Continue home medications   Discharge Exam: Vitals:   04/26/18 0506 04/26/18 0953  BP: (!) 178/97 125/81  Pulse: 81 92  Resp: 18 16  Temp: 97.6 F (36.4 C) 98 F (36.7 C)  SpO2: 95% 96%    General: AAOx3 Cardiovascular: S1S2/RRR Respiratory: CTAB  Discharge Instructions   Discharge Instructions    Diet - low sodium heart healthy   Complete by:  As directed    Increase activity slowly   Complete by:  As directed      Allergies as of 04/26/2018      Reactions   Ivp Dye [iodinated Diagnostic Agents] Hives, Shortness Of Breath   reaction was when pt was 78 years old.   Macrodantin [nitrofurantoin] Other (See Comments)   Fever that lasted several months      Medication List    TAKE these medications   acetaminophen 650 MG CR tablet Commonly known as:  TYLENOL Take 650 mg by mouth every 8 (eight) hours.   albuterol 108 (90 Base) MCG/ACT inhaler Commonly known as:  PROVENTIL HFA;VENTOLIN HFA Inhale 2 puffs into the lungs every 6 (six) hours as needed for wheezing or shortness of breath. Reported on 11/29/2015   arformoterol 15 MCG/2ML Nebu Commonly known as:  BROVANA Take 2 mLs (15 mcg total) by nebulization 2 (two) times daily.   atorvastatin 80 MG tablet Commonly known as:  LIPITOR Take 80 mg by mouth daily.   cephALEXin 500 MG capsule Commonly known as:  KEFLEX Take 1 capsule (500 mg total) by mouth every 8 (eight) hours.   clopidogrel 75 MG tablet Commonly known  as:  PLAVIX Take 1 tablet (75 mg total) by mouth daily. Restart in 7-10days after follow up with ENT Dr,Shoemaker Start taking on:  05/08/2018 What changed:    additional instructions  These instructions start on 05/08/2018. If you are unsure what to do until then, ask your doctor or other care provider.   DULoxetine 30 MG capsule Commonly known  as:  CYMBALTA Take 30 mg by mouth daily.   escitalopram 10 MG tablet Commonly known as:  LEXAPRO Take 10 mg by mouth daily.   folic acid 1 MG tablet Commonly known as:  FOLVITE Take 1 tablet (1 mg total) by mouth daily.   metoprolol succinate 50 MG 24 hr tablet Commonly known as:  TOPROL-XL Take 1 tablet (50 mg total) by mouth daily. Take with or immediately following a meal.   MYRBETRIQ 50 MG Tb24 tablet Generic drug:  mirabegron ER Take 50 mg by mouth daily.   NITROSTAT 0.4 MG SL tablet Generic drug:  nitroGLYCERIN Place 0.4 mg under the tongue every 5 (five) minutes as needed for chest pain. Reported on 11/29/2015   NUTRITIONAL SUPPLEMENT PO Take 1 each by mouth 2 (two) times daily. Health Shakes   omeprazole 40 MG capsule Commonly known as:  PRILOSEC TAKE ONE CAPSULE BY MOUTH TWICE DAILY What changed:  when to take this   sodium chloride 0.65 % Soln nasal spray Commonly known as:  OCEAN Place 1 spray into both nostrils as needed for congestion.   SPIRIVA RESPIMAT 2.5 MCG/ACT Aers Generic drug:  Tiotropium Bromide Monohydrate Inhale 2 puffs into the lungs daily.   SYMBICORT 160-4.5 MCG/ACT inhaler Generic drug:  budesonide-formoterol INHALE TWO PUFFS INTO LUNGS TWICE DAILY What changed:  See the new instructions.      Allergies  Allergen Reactions  . Ivp Dye [Iodinated Diagnostic Agents] Hives and Shortness Of Breath    reaction was when pt was 77 years old.  Clancy Gourd [Nitrofurantoin] Other (See Comments)    Fever that lasted several months   Follow-up Information    [name], [name] Him, MD. Schedule an appointment as soon as possible for a visit in 1 week(s).   Specialty:  Internal Medicine Contact information: [place] [address] [phone]        [name], MD .   Specialty:  Cardiology Contact information: [phone] [address] [place] [address] [phone]            The results of  significant diagnostics from this hospitalization (including imaging, microbiology, ancillary and laboratory) are listed below for reference.    Significant Diagnostic Studies: Dg Chest 2 View  Result Date: 04/26/2018 CLINICAL DATA:  Post fall with bilateral upper extremity pain and skin tears. EXAM: CHEST - 2 VIEW COMPARISON:  Chest radiograph 03/12/2018.  Chest CT 12/07/2017 FINDINGS: Unchanged left pleural effusion and basilar atelectasis since prior exam. No new airspace disease. Chronic hyperinflation and emphysema. Unchanged heart size and mediastinal contours with aortic atherosclerosis. No pneumothorax. No acute osseous abnormalities. IMPRESSION: 1. No acute findings. 2. Unchanged left pleural effusion and basilar atelectasis from exam 6 weeks ago. 3. Aortic Atherosclerosis (ICD10-I70.0) and Emphysema (ICD10-J43.9). Electronically Signed   By: Jeb Levering M.D.   On: 04/26/2018 01:43   Dg Pelvis 1-2 Views  Result Date: 04/26/2018 CLINICAL DATA:  Post fall with bilateral upper extremity pain and skin tears. EXAM: PELVIS - 1-2 VIEW COMPARISON:  CT 12/07/2017. FINDINGS: The cortical margins of the bony pelvis are intact. No  fracture. Pubic symphysis and sacroiliac joints are congruent. Both femoral heads are well-seated in the respective acetabula. Benign-appearing peripherally sclerotic lesion in the left intertrochanteric femur, unchanged. Postsurgical change in the lower lumbar spine. Avascular necrosis of the right femoral head. Avascular necrosis on the left femoral head on prior CT not well seen radiographically. IMPRESSION: No acute pelvic fracture. Electronically Signed   By: Jeb Levering M.D.   On: 04/26/2018 01:44   Dg Elbow Complete Left  Result Date: 04/26/2018 CLINICAL DATA:  Post fall with bilateral upper extremity pain and skin tears. EXAM: LEFT ELBOW - COMPLETE 3+ VIEW COMPARISON:  None. FINDINGS: There is no evidence of fracture, dislocation, or joint effusion. There is no  evidence of arthropathy or other focal bone abnormality. Prominent rounded soft tissue prominence overlying the olecranon likely hematoma in the setting of injury. IMPRESSION: Prominent rounded soft tissue prominence overlying the olecranon likely hematoma in the setting of injury. No fracture or elbow joint effusion. Electronically Signed   By: Jeb Levering M.D.   On: 04/26/2018 01:46   Dg Elbow Complete Right  Result Date: 04/26/2018 CLINICAL DATA:  Post fall with bilateral upper extremity pain and skin tears. EXAM: RIGHT ELBOW - COMPLETE 3+ VIEW COMPARISON:  None. FINDINGS: There is no evidence of fracture, dislocation, or joint effusion. Mild spurring of the medial epicondyle. Mild focal soft tissue prominence overlies the olecranon. IMPRESSION: Soft tissue edema overlying the olecranon. No fracture, dislocation, or joint effusion. Electronically Signed   By: Jeb Levering M.D.   On: 04/26/2018 01:50   Dg Wrist Complete Left  Result Date: 04/26/2018 CLINICAL DATA:  Post fall with bilateral upper extremity pain and skin tears. EXAM: LEFT WRIST - COMPLETE 3+ VIEW COMPARISON:  None. FINDINGS: There is no evidence of fracture or dislocation. There is no evidence of arthropathy or other focal bone abnormality. Soft tissues are unremarkable. IMPRESSION: Negative radiographs of the left wrist. Electronically Signed   By: Jeb Levering M.D.   On: 04/26/2018 01:47   Dg Wrist Complete Right  Result Date: 04/26/2018 CLINICAL DATA:  Post fall with bilateral upper extremity pain and skin tears. EXAM: RIGHT WRIST - COMPLETE 3+ VIEW COMPARISON:  None. FINDINGS: There is no evidence of fracture or dislocation. There is no evidence of arthropathy or other focal bone abnormality. Soft tissues are unremarkable. IMPRESSION: Negative radiographs of the right wrist. Electronically Signed   By: Jeb Levering M.D.   On: 04/26/2018 01:49   Ct Head Wo Contrast  Result Date: 04/26/2018 CLINICAL DATA:  Fall  with facial tear to the nose EXAM: CT HEAD WITHOUT CONTRAST CT MAXILLOFACIAL WITHOUT CONTRAST CT CERVICAL SPINE WITHOUT CONTRAST TECHNIQUE: Multidetector CT imaging of the head, cervical spine, and maxillofacial structures were performed using the standard protocol without intravenous contrast. Multiplanar CT image reconstructions of the cervical spine and maxillofacial structures were also generated. COMPARISON:  CT 10/29/2014 FINDINGS: CT HEAD FINDINGS Brain: No acute territorial infarction, hemorrhage or intracranial mass. Moderate atrophy. Moderate small vessel ischemic changes of the white matter. Stable ventricle size. Vascular: No hyperdense vessels. Vertebral and carotid vascular calcification Skull: No depressed skull fracture Other: Fluid in the inferior mastoid air cells. CT MAXILLOFACIAL FINDINGS Osseous: Mandibular heads are normally positioned. Trace effusion in the inferior mastoids. No mandibular fracture. Pterygoid plates and zygomatic arches are intact. Tiny fracture anterior nasal process of the maxilla. Acute comminuted and mildly displaced nasal bone fractures at the tip of the nose. Fracture mid nasal septum. Orbits: No orbital wall  fracture. Extraocular muscles appear intact. The globes are within normal limits. Sinuses: Acute nondisplaced fracture posterolateral wall of the left maxillary sinus. Possible nondisplaced fracture posterior wall of right maxillary sinus. Hemorrhage within the maxillary and ethmoid sinuses. Debris within the sphenoid sinuses. Hemorrhage and debris within the nasal passages. Soft tissues: Soft tissue swelling over the nasal region and left upper lip. CT CERVICAL SPINE FINDINGS Alignment: Reversal of cervical lordosis. Trace anterolisthesis C3 on C4, unchanged. Facet alignment is normal. Skull base and vertebrae: No acute fracture. No primary bone lesion or focal pathologic process. Soft tissues and spinal canal: No prevertebral fluid or swelling. No visible canal  hematoma. Disc levels: Moderate-to-marked degenerative changes C4-C5, C5-C6 and C6-C7 with mild-to-moderate degenerative changes at C3-C4. Posterior disc osteophyte complex at C4-C5, C5-C6 and C6-C7 with canal stenosis at these levels. Upper chest: Emphysema at the apices. 13 mm hypodense nodule right lobe of thyroid, slight enlargement. Carotid vascular calcification. Other: None IMPRESSION: 1. No definite CT evidence for acute intracranial abnormality. Atrophy and small vessel ischemic changes of the white matter. 2. Reversal of cervical lordosis with trace anterolisthesis C3 on C4. No fracture is seen. 3. Acute mildly displaced and comminuted nasal bone fractures involving the tip of the nasal bones. Fracture through the nasal septum. Small fracture involving the anterior nasal process of the maxillary bone. 4. Acute nondisplaced fracture left posterior all lateral wall of left maxillary sinus with possible nondisplaced fracture of the right posterolateral wall of the right maxillary sinus. Hemorrhagic fluid within both maxillary sinuses. 5. Apical emphysema Electronically Signed   By: Donavan Foil M.D.   On: 04/26/2018 00:20   Ct Cervical Spine Wo Contrast  Result Date: 04/26/2018 CLINICAL DATA:  Fall with facial tear to the nose EXAM: CT HEAD WITHOUT CONTRAST CT MAXILLOFACIAL WITHOUT CONTRAST CT CERVICAL SPINE WITHOUT CONTRAST TECHNIQUE: Multidetector CT imaging of the head, cervical spine, and maxillofacial structures were performed using the standard protocol without intravenous contrast. Multiplanar CT image reconstructions of the cervical spine and maxillofacial structures were also generated. COMPARISON:  CT 10/29/2014 FINDINGS: CT HEAD FINDINGS Brain: No acute territorial infarction, hemorrhage or intracranial mass. Moderate atrophy. Moderate small vessel ischemic changes of the white matter. Stable ventricle size. Vascular: No hyperdense vessels. Vertebral and carotid vascular calcification Skull:  No depressed skull fracture Other: Fluid in the inferior mastoid air cells. CT MAXILLOFACIAL FINDINGS Osseous: Mandibular heads are normally positioned. Trace effusion in the inferior mastoids. No mandibular fracture. Pterygoid plates and zygomatic arches are intact. Tiny fracture anterior nasal process of the maxilla. Acute comminuted and mildly displaced nasal bone fractures at the tip of the nose. Fracture mid nasal septum. Orbits: No orbital wall fracture. Extraocular muscles appear intact. The globes are within normal limits. Sinuses: Acute nondisplaced fracture posterolateral wall of the left maxillary sinus. Possible nondisplaced fracture posterior wall of right maxillary sinus. Hemorrhage within the maxillary and ethmoid sinuses. Debris within the sphenoid sinuses. Hemorrhage and debris within the nasal passages. Soft tissues: Soft tissue swelling over the nasal region and left upper lip. CT CERVICAL SPINE FINDINGS Alignment: Reversal of cervical lordosis. Trace anterolisthesis C3 on C4, unchanged. Facet alignment is normal. Skull base and vertebrae: No acute fracture. No primary bone lesion or focal pathologic process. Soft tissues and spinal canal: No prevertebral fluid or swelling. No visible canal hematoma. Disc levels: Moderate-to-marked degenerative changes C4-C5, C5-C6 and C6-C7 with mild-to-moderate degenerative changes at C3-C4. Posterior disc osteophyte complex at C4-C5, C5-C6 and C6-C7 with canal stenosis at these levels.  Upper chest: Emphysema at the apices. 13 mm hypodense nodule right lobe of thyroid, slight enlargement. Carotid vascular calcification. Other: None IMPRESSION: 1. No definite CT evidence for acute intracranial abnormality. Atrophy and small vessel ischemic changes of the white matter. 2. Reversal of cervical lordosis with trace anterolisthesis C3 on C4. No fracture is seen. 3. Acute mildly displaced and comminuted nasal bone fractures involving the tip of the nasal bones.  Fracture through the nasal septum. Small fracture involving the anterior nasal process of the maxillary bone. 4. Acute nondisplaced fracture left posterior all lateral wall of left maxillary sinus with possible nondisplaced fracture of the right posterolateral wall of the right maxillary sinus. Hemorrhagic fluid within both maxillary sinuses. 5. Apical emphysema Electronically Signed   By: Donavan Foil M.D.   On: 04/26/2018 00:20   Ct Biopsy  Result Date: 03/31/2018 CLINICAL DATA:  BILATERAL SI joint dysfunction. EXAM: BILATERAL CT GUIDED SI JOINT INJECTION PROCEDURE: After a thorough discussion of risks and benefits of the procedure, including bleeding, infection, injury to nerves, blood vessels, and adjacent structures, verbal and written consent was obtained. Specific risks of the procedure included nondiagnostic/nontherapeutic injection and non target injection. The patient was placed prone on the CT table and localization was performed over the sacrum. Target site marked using CT guidance. The skin was prepped and draped in the usual sterile fashion using Betadine soap. After local anesthesia with 1% lidocaine without epinephrine and subsequent deep anesthesia, a 22 gauge spinal needle was advanced into the RIGHT and LEFT SI joint under intermittent CT guidance. Once the needle was in satisfactory position, representative image was captured with the needle demonstrated in the sacroiliac joint. Subsequently, 60 mg of Depo-Medrol along with 1 mL of 0.5% Sensorcaine was injected into each SI joint. Needles removed and a sterile dressing applied. No complications were observed. IMPRESSION: Successful CT-guided BILATERAL SI joint injection with steroid and anesthetic. INDICATION: BILATERAL sacroiliac joint dysfunction. Electronically Signed   By: Staci Righter M.D.   On: 03/31/2018 10:07   Ct Biopsy  Result Date: 03/31/2018 CLINICAL DATA:  BILATERAL SI joint dysfunction. EXAM: BILATERAL CT GUIDED SI JOINT  INJECTION PROCEDURE: After a thorough discussion of risks and benefits of the procedure, including bleeding, infection, injury to nerves, blood vessels, and adjacent structures, verbal and written consent was obtained. Specific risks of the procedure included nondiagnostic/nontherapeutic injection and non target injection. The patient was placed prone on the CT table and localization was performed over the sacrum. Target site marked using CT guidance. The skin was prepped and draped in the usual sterile fashion using Betadine soap. After local anesthesia with 1% lidocaine without epinephrine and subsequent deep anesthesia, a 22 gauge spinal needle was advanced into the RIGHT and LEFT SI joint under intermittent CT guidance. Once the needle was in satisfactory position, representative image was captured with the needle demonstrated in the sacroiliac joint. Subsequently, 60 mg of Depo-Medrol along with 1 mL of 0.5% Sensorcaine was injected into each SI joint. Needles removed and a sterile dressing applied. No complications were observed. IMPRESSION: Successful CT-guided BILATERAL SI joint injection with steroid and anesthetic. INDICATION: BILATERAL sacroiliac joint dysfunction. Electronically Signed   By: Staci Righter M.D.   On: 03/31/2018 10:07   Dg Hand Complete Left  Result Date: 04/26/2018 CLINICAL DATA:  Post fall with bilateral upper extremity pain and skin tears. EXAM: LEFT HAND - COMPLETE 3+ VIEW COMPARISON:  None. FINDINGS: There is no evidence of fracture or dislocation. Mild osteoarthritis at the  thumb carpal metacarpal joint. There is no evidence of arthropathy or other focal bone abnormality. Soft tissues are unremarkable. IMPRESSION: No fracture or acute osseous abnormality of the left hand. Electronically Signed   By: Jeb Levering M.D.   On: 04/26/2018 01:48   Dg Hand Complete Right  Result Date: 04/26/2018 CLINICAL DATA:  Post fall with bilateral upper extremity pain and skin tears. EXAM:  RIGHT HAND - COMPLETE 3+ VIEW COMPARISON:  None. FINDINGS: There is no evidence of fracture or dislocation. Osteoarthritis at the thumb carpal metacarpal joint. There is no evidence of additional arthropathy or other focal bone abnormality. Soft tissues are unremarkable. IMPRESSION: No fracture or acute osseous abnormality of the right hand. Electronically Signed   By: Jeb Levering M.D.   On: 04/26/2018 01:49   Ct Maxillofacial Wo Contrast  Result Date: 04/26/2018 CLINICAL DATA:  Fall with facial tear to the nose EXAM: CT HEAD WITHOUT CONTRAST CT MAXILLOFACIAL WITHOUT CONTRAST CT CERVICAL SPINE WITHOUT CONTRAST TECHNIQUE: Multidetector CT imaging of the head, cervical spine, and maxillofacial structures were performed using the standard protocol without intravenous contrast. Multiplanar CT image reconstructions of the cervical spine and maxillofacial structures were also generated. COMPARISON:  CT 10/29/2014 FINDINGS: CT HEAD FINDINGS Brain: No acute territorial infarction, hemorrhage or intracranial mass. Moderate atrophy. Moderate small vessel ischemic changes of the white matter. Stable ventricle size. Vascular: No hyperdense vessels. Vertebral and carotid vascular calcification Skull: No depressed skull fracture Other: Fluid in the inferior mastoid air cells. CT MAXILLOFACIAL FINDINGS Osseous: Mandibular heads are normally positioned. Trace effusion in the inferior mastoids. No mandibular fracture. Pterygoid plates and zygomatic arches are intact. Tiny fracture anterior nasal process of the maxilla. Acute comminuted and mildly displaced nasal bone fractures at the tip of the nose. Fracture mid nasal septum. Orbits: No orbital wall fracture. Extraocular muscles appear intact. The globes are within normal limits. Sinuses: Acute nondisplaced fracture posterolateral wall of the left maxillary sinus. Possible nondisplaced fracture posterior wall of right maxillary sinus. Hemorrhage within the maxillary and  ethmoid sinuses. Debris within the sphenoid sinuses. Hemorrhage and debris within the nasal passages. Soft tissues: Soft tissue swelling over the nasal region and left upper lip. CT CERVICAL SPINE FINDINGS Alignment: Reversal of cervical lordosis. Trace anterolisthesis C3 on C4, unchanged. Facet alignment is normal. Skull base and vertebrae: No acute fracture. No primary bone lesion or focal pathologic process. Soft tissues and spinal canal: No prevertebral fluid or swelling. No visible canal hematoma. Disc levels: Moderate-to-marked degenerative changes C4-C5, C5-C6 and C6-C7 with mild-to-moderate degenerative changes at C3-C4. Posterior disc osteophyte complex at C4-C5, C5-C6 and C6-C7 with canal stenosis at these levels. Upper chest: Emphysema at the apices. 13 mm hypodense nodule right lobe of thyroid, slight enlargement. Carotid vascular calcification. Other: None IMPRESSION: 1. No definite CT evidence for acute intracranial abnormality. Atrophy and small vessel ischemic changes of the white matter. 2. Reversal of cervical lordosis with trace anterolisthesis C3 on C4. No fracture is seen. 3. Acute mildly displaced and comminuted nasal bone fractures involving the tip of the nasal bones. Fracture through the nasal septum. Small fracture involving the anterior nasal process of the maxillary bone. 4. Acute nondisplaced fracture left posterior all lateral wall of left maxillary sinus with possible nondisplaced fracture of the right posterolateral wall of the right maxillary sinus. Hemorrhagic fluid within both maxillary sinuses. 5. Apical emphysema Electronically Signed   By: Donavan Foil M.D.   On: 04/26/2018 00:20    Microbiology: No results found for  this or any previous visit (from the past 240 hour(s)).   Labs: Basic Metabolic Panel: Recent Labs  Lab 04/25/18 2326 04/26/18 0346  NA 136 137  K 3.6 3.6  CL 98 102  CO2 26 23  GLUCOSE 84 77  BUN 14 11  CREATININE 0.68 0.63  CALCIUM 8.9 8.5*    Liver Function Tests: Recent Labs  Lab 04/25/18 2326  AST 33  ALT 22  ALKPHOS 155*  BILITOT 0.4  PROT 6.9  ALBUMIN 3.2*   No results for input(s): LIPASE, AMYLASE in the last 168 hours. No results for input(s): AMMONIA in the last 168 hours. CBC: Recent Labs  Lab 04/25/18 2326 04/26/18 0346  WBC 7.5 8.0  NEUTROABS 3.4  --   HGB 12.5* 12.1*  HCT 37.9* 36.8*  MCV 102.7* 103.4*  PLT 290 270   Cardiac Enzymes: Recent Labs  Lab 04/25/18 2326  TROPONINI <0.03   BNP: BNP (last 3 results) Recent Labs    09/20/17 2202 11/21/17 1000 04/26/18 0346  BNP 243.7* 184.6* 142.7*    ProBNP (last 3 results) No results for input(s): PROBNP in the last 8760 hours.  CBG: No results for input(s): GLUCAP in the last 168 hours.     Signed:  Domenic Polite MD.  [facility] 04/26/2018, 2:26 PM

## 2018-05-08 DIAGNOSIS — L608 Other nail disorders: Secondary | ICD-10-CM | POA: Insufficient documentation

## 2018-05-14 ENCOUNTER — Emergency Department (HOSPITAL_COMMUNITY)
Admission: EM | Admit: 2018-05-14 | Discharge: 2018-05-15 | Disposition: A | Discharge status: Home / Self Care | Payer: Medicare Other | Attending: Emergency Medicine | Admitting: Emergency Medicine

## 2018-05-14 ENCOUNTER — Emergency Department (HOSPITAL_COMMUNITY): Payer: Medicare Other

## 2018-05-14 DIAGNOSIS — Y999 Unspecified external cause status: Secondary | ICD-10-CM | POA: Insufficient documentation

## 2018-05-14 DIAGNOSIS — W1789XA Other fall from one level to another, initial encounter: Secondary | ICD-10-CM | POA: Diagnosis not present

## 2018-05-14 DIAGNOSIS — I2510 Atherosclerotic heart disease of native coronary artery without angina pectoris: Secondary | ICD-10-CM | POA: Diagnosis not present

## 2018-05-14 DIAGNOSIS — Z7902 Long term (current) use of antithrombotics/antiplatelets: Secondary | ICD-10-CM | POA: Insufficient documentation

## 2018-05-14 DIAGNOSIS — I10 Essential (primary) hypertension: Secondary | ICD-10-CM | POA: Diagnosis not present

## 2018-05-14 DIAGNOSIS — F10920 Alcohol use, unspecified with intoxication, uncomplicated: Secondary | ICD-10-CM | POA: Diagnosis not present

## 2018-05-14 DIAGNOSIS — W19XXXA Unspecified fall, initial encounter: Secondary | ICD-10-CM

## 2018-05-14 DIAGNOSIS — J449 Chronic obstructive pulmonary disease, unspecified: Secondary | ICD-10-CM | POA: Diagnosis not present

## 2018-05-14 DIAGNOSIS — D649 Anemia, unspecified: Secondary | ICD-10-CM | POA: Insufficient documentation

## 2018-05-14 DIAGNOSIS — S0531XA Ocular laceration without prolapse or loss of intraocular tissue, right eye, initial encounter: Secondary | ICD-10-CM | POA: Insufficient documentation

## 2018-05-14 DIAGNOSIS — T148XXA Other injury of unspecified body region, initial encounter: Secondary | ICD-10-CM

## 2018-05-14 DIAGNOSIS — Y92009 Unspecified place in unspecified non-institutional (private) residence as the place of occurrence of the external cause: Secondary | ICD-10-CM | POA: Insufficient documentation

## 2018-05-14 DIAGNOSIS — S0990XA Unspecified injury of head, initial encounter: Secondary | ICD-10-CM | POA: Diagnosis present

## 2018-05-14 DIAGNOSIS — Z79899 Other long term (current) drug therapy: Secondary | ICD-10-CM | POA: Insufficient documentation

## 2018-05-14 DIAGNOSIS — Y939 Activity, unspecified: Secondary | ICD-10-CM | POA: Insufficient documentation

## 2018-05-14 DIAGNOSIS — S51011A Laceration without foreign body of right elbow, initial encounter: Secondary | ICD-10-CM | POA: Diagnosis not present

## 2018-05-14 DIAGNOSIS — Y907 Blood alcohol level of 200-239 mg/100 ml: Secondary | ICD-10-CM | POA: Insufficient documentation

## 2018-05-14 MED ORDER — LIDOCAINE-EPINEPHRINE (PF) 2 %-1:200000 IJ SOLN
20.0000 mL | Freq: Once | INTRAMUSCULAR | Status: DC
  Filled 2018-05-14: qty 20

## 2018-05-14 MED ORDER — MORPHINE SULFATE (PF) 2 MG/ML IV SOLN
2.0000 mg | Freq: Once | INTRAVENOUS | Status: DC
  Filled 2018-05-14: qty 1

## 2018-05-14 NOTE — ED Triage Notes (Addendum)
Pt arrived via [facility] from home after falling. Unkown down time but estimated to be 1hr. Large laceration to the right elbow, right eye laceration, finger nail torn back on left ring finger. Multiple skin tears noted. Pt is alert. ETOH on board. Pt called EMS via Life Alert. Pt on plavix. Yellow "Fall Risk" band still in place from previous visit. 570mL bolus given PTA. No other medication given by EMS. EMS initial bp 104/69 ==> 143/77 after fluid bolus.

## 2018-05-15 ENCOUNTER — Emergency Department (HOSPITAL_COMMUNITY): Payer: Medicare Other

## 2018-05-15 LAB — CBC WITH DIFFERENTIAL/PLATELET
Abs Immature Granulocytes: 0 10*3/uL (ref 0.0–0.1)
BASOS PCT: 1 %
Basophils Absolute: 0.1 10*3/uL (ref 0.0–0.1)
EOS ABS: 0 10*3/uL (ref 0.0–0.7)
EOS PCT: 1 %
HEMATOCRIT: 27.6 % — AB (ref 39.0–52.0)
Hemoglobin: 9 g/dL — ABNORMAL LOW (ref 13.0–17.0)
IMMATURE GRANULOCYTES: 1 %
LYMPHS ABS: 3.2 10*3/uL (ref 0.7–4.0)
Lymphocytes Relative: 37 %
MCH: 34.7 pg — AB (ref 26.0–34.0)
MCHC: 32.6 g/dL (ref 30.0–36.0)
MCV: 106.6 fL — AB (ref 78.0–100.0)
MONOS PCT: 9 %
Monocytes Absolute: 0.8 10*3/uL (ref 0.1–1.0)
Neutro Abs: 4.6 10*3/uL (ref 1.7–7.7)
Neutrophils Relative %: 53 %
PLATELETS: 328 10*3/uL (ref 150–400)
RBC: 2.59 MIL/uL — ABNORMAL LOW (ref 4.22–5.81)
RDW: 14.6 % (ref 11.5–15.5)
WBC: 8.6 10*3/uL (ref 4.0–10.5)

## 2018-05-15 LAB — CBC
HCT: 25.1 % — ABNORMAL LOW (ref 39.0–52.0)
Hemoglobin: 8.3 g/dL — ABNORMAL LOW (ref 13.0–17.0)
MCH: 34.4 pg — ABNORMAL HIGH (ref 26.0–34.0)
MCHC: 33.1 g/dL (ref 30.0–36.0)
MCV: 104.1 fL — ABNORMAL HIGH (ref 78.0–100.0)
Platelets: 265 10*3/uL (ref 150–400)
RBC: 2.41 MIL/uL — ABNORMAL LOW (ref 4.22–5.81)
RDW: 14.5 % (ref 11.5–15.5)
WBC: 7.8 10*3/uL (ref 4.0–10.5)

## 2018-05-15 LAB — COMPREHENSIVE METABOLIC PANEL
ALBUMIN: 2.6 g/dL — AB (ref 3.5–5.0)
ALK PHOS: 104 U/L (ref 38–126)
ALT: 18 U/L (ref 0–44)
AST: 36 U/L (ref 15–41)
Anion gap: 11 (ref 5–15)
BUN: 13 mg/dL (ref 8–23)
CALCIUM: 7.5 mg/dL — AB (ref 8.9–10.3)
CO2: 22 mmol/L (ref 22–32)
CREATININE: 0.75 mg/dL (ref 0.61–1.24)
Chloride: 106 mmol/L (ref 98–111)
GFR calc Af Amer: >60 mL/min (ref >60)
GLUCOSE: 86 mg/dL (ref 70–99)
POTASSIUM: 3.4 mmol/L — AB (ref 3.5–5.1)
Sodium: 139 mmol/L (ref 135–145)
Total Bilirubin: 0.4 mg/dL (ref 0.3–1.2)
Total Protein: 5.4 g/dL — ABNORMAL LOW (ref 6.5–8.1)

## 2018-05-15 LAB — TYPE AND SCREEN
ABO/RH(D): O POS
Antibody Screen: NEGATIVE

## 2018-05-15 LAB — CBG MONITORING, ED: Glucose-Capillary: 103 mg/dL — ABNORMAL HIGH (ref 70–99)

## 2018-05-15 LAB — ETHANOL: Alcohol, Ethyl (B): 236 mg/dL — ABNORMAL HIGH (ref <10)

## 2018-05-15 MED ORDER — SODIUM CHLORIDE 0.9 % IV BOLUS
500.0000 mL | Freq: Once | INTRAVENOUS | Status: AC
  Administered 2018-05-15: 500 mL via INTRAVENOUS

## 2018-05-15 NOTE — ED Notes (Signed)
Admitting MD Kakrakandy at bedside.  

## 2018-05-15 NOTE — Discharge Instructions (Signed)
Please follow-up with your doctor in 2 to 3 days for recheck.  Your hemoglobin was low and we recommend you stay in the hospital.  Please wash your wounds and change dressings daily.  Please return the emergency department immediately if you develop any new or worsening symptoms.

## 2018-05-15 NOTE — ED Notes (Signed)
Patient transported to CT 

## 2018-05-15 NOTE — ED Notes (Signed)
Washed pt.hair head was filled with lots blood .making sure pt.didnt have a head laceration just blood from laying on the floor.washed face hands upper chest area.place clean gown and warm blankets on before going to CT.

## 2018-05-15 NOTE — ED Notes (Addendum)
Pt verbalizes understanding of d/c instructions. Pt taken to lobby in wheelchair at d/c with all belongings. Contacting friend for a ride home.

## 2018-05-15 NOTE — ED Provider Notes (Signed)
Signout from previous provider, Janetta Hora, PA-C, at shift change See previous providers note for full H&P  Briefly, patient is an alcoholic who has frequent falls.  Tonight, he was cooking dinner and just fell.  He cannot explain what happened.  EMS reported that it looked like a murder scene based on how much blood was in the kitchen.  Patient has several skin tears.  Patient does not remember hitting his head or losing consciousness.  He apparently laid on the floor for an hour.  Patient found to have an open tuft fracture on the left ring finger.  CT maxillofacial pending.  Does have drop in hemoglobin 12.1-9.0.  Plan for with static vital signs, another small fluid bolus, and recheck hemoglobin. Wounds cleaned and dressed and are not amenable to suture repair.   CT head, C-spine, and maxillofacial are stable and showed no acute findings.  Patient's previous nasal bone fractures are stable and maxillary sinus fracture is healed.  After fluids, hemoglobin has dropped again to 8.3.  Patient refused guaiac exam and denies bloody stools.  I consulted Dr. Hal Hope with [facility] who evaluated the patient and after long discussion patient stated he did not want to stay in the hospital.  Patient will leave [place].  He was stable and walking without lightheadedness or dizziness prior to discharge.  Wound care discussed at home.  Patient advised he needs to follow-up with his PCP for work-up and recheck of hemoglobin drop.  I discussed patient case with Dr. Betsey Holiday who guided the patient's management and agrees with plan.    Frederica Kuster, PA-C 05/15/18 1631    Orpah Greek, MD 05/16/18 [phone]

## 2018-05-15 NOTE — ED Notes (Signed)
ED Provider at bedside. 

## 2018-05-15 NOTE — ED Notes (Signed)
Pt ambulated in hallway with standby assistance. Pt ambulatory independently without difficulty, held on to side rail as needed. Pt reported dizziness upon initially sitting up but denied any further dizziness. Pt reports that he has a walker at home if needed.

## 2018-05-15 NOTE — ED Provider Notes (Cosign Needed)
[facility] EMERGENCY DEPARTMENT Provider Note   CSN: [id number] Arrival date & time: 05/14/18  2159     History   Chief Complaint Chief Complaint  Patient presents with  . Laceration    HPI Talha Iser Fetsch is a 77 y.o. male who presents with a fall. PMH significant for frequent falls, CAD on plavix, COPD, smoker, chronic back pain, hx of CVA. He states that he was cooking dinner and the next thing he knows he was on the ground. He thinks he was on the ground for an hour before he could hit his life alert button. EMS responded and wrapped his head, left hand, and right elbow. They state blood loss at the scene was extensive. The patient was recently admitted for a fall on 8/24. He sustained a nasal fracture, maxillary sinus fracture, and multiple skin tears at that time. He was noted to be intoxicated and was admitted for possible syncope. He was advised to hold his Plavix until his ENT follow up. The patient states he hasn't taken any of his medicine in the past 2 days. He had another fall in the [place] parking lot last week and sustained a tuft fracture of his left ring finger. He sees Dr. Fredna Dow for this. The patient denies any pain other than his chronic pain.  HPI  Past Medical History:  Diagnosis Date  . Alcohol withdrawal ([place])   . Allergy    seasonal  . Arthritis   . Barrett esophagus   . BPH (benign prostatic hyperplasia)   . CAD (coronary artery disease)    a. s/p multiple POBA last in 2011 to distal OM2. b. Takotsubo event 09/2017 with cath showing no culprit, 25% prox LAD, 60% OM1, 65% OM2, EF 35-40% by cath and 30-35% by echo.  . Chronic back pain   . COPD (chronic obstructive pulmonary disease) (HCC)    Albuterol inhaler as needed.Uses Symbicort daily  . Depression   . Emphysema lung ([place])   . Fall    fell on 10/06/16 with loss of consiousness for a short time.States he remembers getting up but remembers nothing else.  . Gastric ulcer   . Gastritis   .  GERD (gastroesophageal reflux disease)    takes Omeprazole daily  . History of colon polyps    benign  . History of kidney stones   . Hyperlipidemia   . Hypertension   . Hyponatremia   . 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 Neuropathy   . Nocturia   . Pneumonia    hx of  . PONV (postoperative nausea and vomiting)   . Skin cancer   . Stroke [facility]) 1995   Affected balance; and has very emotional if watching a movie  . Syncope    yr ago  . Takotsubo cardiomyopathy    a. 09/2017 - EF 35-40% by cath, 30-35% by echo.  . Urgency of urination    takes Myrbetriq daily    Patient Active Problem List   Diagnosis Date Noted  . Fall 04/26/2018  . Nasal bone fracture 04/26/2018  . Maxillary sinus fracture ([place]) 04/26/2018  . Multiple skin tears   . Alcoholic intoxication without complication ([place])   . Liver mass 01/01/2018  . Adjustment disorder with depressed mood 12/29/2017  . Neurocognitive disorder 12/21/2017  . Adult failure to thrive 12/09/2017  . Pressure injury of skin 12/01/2017  . Empyema ([place]) 11/24/2017  . Iron  deficiency anemia 11/22/2017  . Acute urinary retention 11/22/2017  . Hypokalemia   . Hypomagnesemia   . Parapneumonic effusion   . Sepsis due to pneumonia ([place])   . ETOH abuse 11/21/2017  . Acute respiratory failure ([place]) 11/21/2017  . HCAP (healthcare-associated pneumonia) 11/21/2017  . Sepsis ([place]) 11/21/2017  . Pleural effusion 11/21/2017  . Acute kidney injury ([place]) 11/21/2017  . Abdominal pain 11/21/2017  . Malnutrition of moderate degree 09/24/2017  . Acute delirium 09/16/2017  . Acute hyponatremia 09/16/2017  . Alcohol withdrawal ([place]) 09/16/2017  . 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 Tobacco abuse 09/16/2017  . Depression 09/16/2017  . History of CVA (cerebrovascular accident) 09/16/2017  . History of Barrett's esophagus 09/16/2017  . Takotsubo cardiomyopathy  09/15/2017  . Chronic back pain 12/01/2016  . S/P lumbar spinal fusion 12/01/2016  . Dysthymia 12/01/2016  . Radiculopathy 10/17/2016  . Abnormal CT of the chest 06/10/2016  . Dysphagia   . Essential hypertension, benign 10/30/2014    Class: Chronic  . Syncope 10/29/2014  . Cough syncope 10/07/2014  . COPD with acute exacerbation ([place]) 09/29/2014  . Pulmonary nodules 11/22/2013  . Personal history of colonic polyps 06/04/2010  . COPD (chronic obstructive pulmonary disease) with emphysema ([place]) 02/26/2010  . CHEST PAIN, ATYPICAL 02/26/2010  . BARRETTS ESOPHAGUS 05/18/2009  . HLD (hyperlipidemia) 04/28/2008  . Coronary atherosclerosis 04/28/2008  . CVA 04/28/2008  . GERD 04/28/2008  . ARTHRITIS 04/28/2008  . NEPHROLITHIASIS, HX OF 04/28/2008    Past Surgical History:  Procedure Laterality Date  . APPENDECTOMY    . CARDIAC CATHETERIZATION     x 4. Most recent one in 2011 at [facility](3 previous were done 20 yrs before)  . cataracts Bilateral   . CHEST EXPLORATION Left 11/24/2017   Procedure: CHEST EXPLORATION;  Surgeon: [name], MD;  Location: [facility];  Service: Thoracic;  Laterality: Left;  . COLONOSCOPY    . CYSTOSCOPY  07/21/2013   Dr. Jeffie Pollock  . DENTAL SURGERY     with sedation  . ESOPHAGEAL MANOMETRY N/A 09/20/2015   Procedure: ESOPHAGEAL MANOMETRY (EM);  Surgeon: Manus Gunning, MD;  Location: [facility];  Service: Gastroenterology;  Laterality: N/A;  . LEFT HEART CATH AND CORONARY ANGIOGRAPHY N/A 09/15/2017   Procedure: LEFT HEART CATH AND CORONARY ANGIOGRAPHY;  Surgeon: Martinique, Peter M, MD;  Location: [facility];  Service: Cardiovascular;  Laterality: N/A;  . LUMBAR LAMINECTOMY/DECOMPRESSION MICRODISCECTOMY N/A 01/12/2015   Procedure: LUMBAR LAMINECTOMY/DECOMPRESSION MICRODISCECTOMY 3 LEVELS;  Surgeon: Phylliss Bob, MD;  Location: [facility];  Service: Orthopedics;  Laterality: N/A;  Lumbar 3-4, lumbar 4-5, lumbar 5-sacrum 1 decompression  . parathyroid  adenoma removed    . POLYPECTOMY    . ROTATOR CUFF REPAIR     bilateral  . TONSILLECTOMY    . UPPER GASTROINTESTINAL ENDOSCOPY    . VIDEO ASSISTED THORACOSCOPY (VATS)/EMPYEMA Left 11/24/2017   Procedure: VIDEO ASSISTED THORACOSCOPY (VATS)/EMPYEMA;  Surgeon: [name], MD;  Location: [facility];  Service: Thoracic;  Laterality: Left;  Marland Kitchen VIDEO BRONCHOSCOPY N/A 11/24/2017   Procedure: VIDEO BRONCHOSCOPY;  Surgeon: [name], MD;  Location[facility];  Service: Thoracic;  Laterality: N/A;        Home Medications    Prior to Admission medications   Medication Sig Start Date End Date Taking? Authorizing Provider  acetaminophen (TYLENOL) 650 MG CR tablet Take 650 mg by mouth every 8 (eight) hours.     [provider]  albuterol (PROVENTIL HFA;VENTOLIN HFA) 108 (90 BASE) MCG/ACT inhaler Inhale 2 puffs into the lungs every 6 (six) hours as needed for wheezing or shortness of breath. Reported on 11/29/2015    [provider]  arformoterol (BROVANA) 15 MCG/2ML NEBU Take 2 mLs (15 mcg total) by nebulization 2 (two) times daily. 02/09/18   Noralee Space, MD  atorvastatin (LIPITOR) 80 MG tablet Take 80 mg by mouth daily.     [provider]  cephALEXin (KEFLEX) 500 MG capsule Take 1 capsule (500 mg total) by mouth every 8 (eight) hours. 04/26/18   Domenic Polite, MD  clopidogrel (PLAVIX) 75 MG tablet Take 1 tablet (75 mg total) by mouth daily. Restart in 7-10days after follow up with ENT Dr,[place] 05/08/18   Domenic Polite, MD  DULoxetine (CYMBALTA) 30 MG capsule Take 30 mg by mouth daily.    [provider]  escitalopram (LEXAPRO) 10 MG tablet Take 10 mg by mouth daily. 04/08/18   [provider]  folic acid (FOLVITE) 1 MG tablet Take 1 tablet (1 mg total) by mouth daily. 09/19/17   Raiford Noble Latif, DO  metoprolol succinate (TOPROL-XL) 50 MG 24 hr tablet Take 1 tablet (50 mg total) by mouth daily. Take with or immediately following a meal. 09/19/17    Duke, Tami Lin, PA  MYRBETRIQ 50 MG TB24 tablet Take 50 mg by mouth daily.  05/03/15   [provider]  NITROSTAT 0.4 MG SL tablet Place 0.4 mg under the tongue every 5 (five) minutes as needed for chest pain. Reported on 11/29/2015 05/29/13   [provider]  Nutritional Supplements (NUTRITIONAL SUPPLEMENT PO) Take 1 each by mouth 2 (two) times daily. Health Shakes    [provider]  omeprazole (PRILOSEC) 40 MG capsule TAKE ONE CAPSULE BY MOUTH TWICE DAILY Patient taking differently: Take 40 mg by mouth daily.  04/22/16   Armbruster, Carlota Raspberry, MD  sodium chloride (OCEAN) 0.65 % SOLN nasal spray Place 1 spray into both nostrils as needed for congestion. 04/26/18   Domenic Polite, MD  SYMBICORT 160-4.5 MCG/ACT inhaler INHALE TWO PUFFS INTO LUNGS TWICE DAILY Patient taking differently: Inhale 2 puffs into the lungs 2 (two) times daily.  04/19/14   ClanceArmando Reichert, MD  Tiotropium Bromide Monohydrate (SPIRIVA RESPIMAT) 2.5 MCG/ACT AERS Inhale 2 puffs into the lungs daily.     [provider]    Family History Family History  Problem Relation Age of Onset  . Stroke Mother   . Rheum arthritis Mother   . Heart disease Father   . Colon cancer Unknown        ? mat. uncle died age 18  . Colon polyps Neg Hx   . Esophageal cancer Neg Hx   . Rectal cancer Neg Hx   . Stomach cancer Neg Hx     Social History Social History   Tobacco Use  . Smoking status: Current Every [place] Smoker    Packs/Custis: 1.50    Years: 58.00    Pack years: 87.00    Types: Cigarettes  . Smokeless tobacco: Never Used  Substance Use Topics  . Alcohol use: Yes    Alcohol/week: 14.0 standard drinks    Types: 14 Standard drinks or equivalent per week    Comment: every [place], vodka- 1-2 a [place]   . Drug use: No     Allergies   Ivp dye [iodinated diagnostic agents] and Macrodantin [nitrofurantoin]   Review of Systems Review of Systems  Respiratory: Negative  for shortness of breath.    Cardiovascular: Negative for chest pain.  Gastrointestinal: Negative for abdominal pain.  Musculoskeletal: Positive for myalgias. Negative for arthralgias, back pain, joint swelling and neck pain.  Skin: Positive for wound.  Neurological: Negative for syncope and headaches.  Hematological: Bruises/bleeds easily.  All other systems reviewed and are negative.    Physical Exam Updated Vital Signs BP (!) 150/74 (BP Location: Right Arm)   Pulse 66   Temp 97.6 F (36.4 C) (Axillary)   Resp 19   SpO2 99%   Physical Exam  Constitutional: He is oriented to person, place, and time. He appears well-developed and well-nourished. No distress.  Calm, cooperative. He has an extensive amount of dried blood in his hair, on his face, chest, and bilateral upper arms. He appears intoxicated. In C-collar  HENT:  Head: Normocephalic. Head is with raccoon's eyes.  Scalp was cleansed - no obvious scalp laceration was seen other than small area over right side of forehead. There is right periorbital bruising and bruising over the bridge of his nose  Eyes: Pupils are equal, round, and reactive to light. Conjunctivae are normal. Right eye exhibits no discharge. Left eye exhibits no discharge. No scleral icterus.  Neck: Normal range of motion.  Cardiovascular: Normal rate and regular rhythm.  Pulmonary/Chest: Effort normal and breath sounds normal. No respiratory distress.  Abdominal: Soft. Bowel sounds are normal. He exhibits no distension.  Musculoskeletal:  Left hand: Subungual hematoma of right 4th finger. Small skin tear which is oozing blood between 2nd and 3rd webspace. 2+ radial pulse  Right elbow: Large skin tear over posterior elbow. FROM. 2+ radial pulse  Neurological: He is alert and oriented to person, place, and time.  Skin: Skin is warm and dry.  Psychiatric: He has a normal mood and affect. His behavior is normal.  Nursing note and vitals reviewed.    ED Treatments / Results   Labs (all labs ordered are listed, but only abnormal results are displayed) Labs Reviewed  CBC WITH DIFFERENTIAL/PLATELET - Abnormal; Notable for the following components:      Result Value   RBC 2.59 (*)    Hemoglobin 9.0 (*)    HCT 27.6 (*)    MCV 106.6 (*)    MCH 34.7 (*)    All other components within normal limits  ETHANOL - Abnormal; Notable for the following components:   Alcohol, Ethyl (B) 236 (*)    All other components within normal limits  COMPREHENSIVE METABOLIC PANEL - Abnormal; Notable for the following components:   Potassium 3.4 (*)    Calcium 7.5 (*)    Total Protein 5.4 (*)    Albumin 2.6 (*)    All other components within normal limits  TYPE AND SCREEN    EKG EKG Interpretation  Date/Time:  Thursday May 14 2018 22:14:08 EDT Ventricular Rate:  68 PR Interval:    QRS Duration: 83 QT Interval:  461 QTC Calculation: 491 R Axis:   17 Text Interpretation:  Sinus rhythm Borderline prolonged QT interval Baseline wander in lead(s) V1 similar to prior 8/19 Confirmed by Aletta Edouard [phone]) on 05/14/2018 10:23:35 PM   Radiology Dg Elbow Complete Right  Result Date: 05/14/2018 CLINICAL DATA:  Fall with laceration EXAM: RIGHT ELBOW - COMPLETE 3+ VIEW COMPARISON:  04/26/2018 FINDINGS: No fracture or malalignment. Small elbow effusion is suspected. No radiopaque foreign body. IMPRESSION: Possible small elbow effusion. No definitive fracture lucency is seen Electronically Signed   By: Madie Reno.D.  On: 05/14/2018 23:08   Ct Head Wo Contrast  Result Date: 05/14/2018 CLINICAL DATA:  Fall with laceration to the elbow and eye EXAM: CT HEAD WITHOUT CONTRAST CT CERVICAL SPINE WITHOUT CONTRAST TECHNIQUE: Multidetector CT imaging of the head and cervical spine was performed following the standard protocol without intravenous contrast. Multiplanar CT image reconstructions of the cervical spine were also generated. COMPARISON:  CT 04/25/2018 FINDINGS: CT HEAD  FINDINGS Brain: No acute territorial infarction, hemorrhage or intracranial mass. Mild motion degradation. Atrophy and small vessel ischemic changes of the white matter. Probable chronic lacunar infarcts in the basal ganglia. Stable ventricle size. Vascular: No hyperdense vessels.  Carotid vascular calcification Skull: No fracture Sinuses/Orbits: No acute finding. Other: Moderate right periorbital hematoma CT CERVICAL SPINE FINDINGS Alignment: Reversal of cervical lordosis. Trace anterolisthesis C3 on C4 and trace retrolisthesis C4 on C5. Facet alignment within normal limits. Skull base and vertebrae: No acute fracture. No primary bone lesion or focal pathologic process. Soft tissues and spinal canal: No prevertebral fluid or swelling. No visible canal hematoma. Disc levels: Mild degenerative changes at C3-C4, moderate degenerative changes C4 through C7. Multiple level facet degenerative change with foraminal narrowing. Upper chest: Carotid vascular calcification. Hypodense nodule in the right lobe of thyroid. Emphysema at the apices Other: None IMPRESSION: 1. Mild motion degradation. No definite CT evidence for acute intracranial abnormality. Atrophy and small vessel ischemic changes of the white matter 2. Reversal of cervical lordosis. No acute fracture seen. Degenerative changes. 3. Moderate right periorbital hematoma Electronically Signed   By: Donavan Foil M.D.   On: 05/14/2018 23:24   Ct Cervical Spine Wo Contrast  Result Date: 05/14/2018 CLINICAL DATA:  Fall with laceration to the elbow and eye EXAM: CT HEAD WITHOUT CONTRAST CT CERVICAL SPINE WITHOUT CONTRAST TECHNIQUE: Multidetector CT imaging of the head and cervical spine was performed following the standard protocol without intravenous contrast. Multiplanar CT image reconstructions of the cervical spine were also generated. COMPARISON:  CT 04/25/2018 FINDINGS: CT HEAD FINDINGS Brain: No acute territorial infarction, hemorrhage or intracranial mass.  Mild motion degradation. Atrophy and small vessel ischemic changes of the white matter. Probable chronic lacunar infarcts in the basal ganglia. Stable ventricle size. Vascular: No hyperdense vessels.  Carotid vascular calcification Skull: No fracture Sinuses/Orbits: No acute finding. Other: Moderate right periorbital hematoma CT CERVICAL SPINE FINDINGS Alignment: Reversal of cervical lordosis. Trace anterolisthesis C3 on C4 and trace retrolisthesis C4 on C5. Facet alignment within normal limits. Skull base and vertebrae: No acute fracture. No primary bone lesion or focal pathologic process. Soft tissues and spinal canal: No prevertebral fluid or swelling. No visible canal hematoma. Disc levels: Mild degenerative changes at C3-C4, moderate degenerative changes C4 through C7. Multiple level facet degenerative change with foraminal narrowing. Upper chest: Carotid vascular calcification. Hypodense nodule in the right lobe of thyroid. Emphysema at the apices Other: None IMPRESSION: 1. Mild motion degradation. No definite CT evidence for acute intracranial abnormality. Atrophy and small vessel ischemic changes of the white matter 2. Reversal of cervical lordosis. No acute fracture seen. Degenerative changes. 3. Moderate right periorbital hematoma Electronically Signed   By: Donavan Foil M.D.   On: 05/14/2018 23:24   Dg Hand Complete Left  Result Date: 05/14/2018 CLINICAL DATA:  Left hand laceration EXAM: LEFT HAND - COMPLETE 3+ VIEW COMPARISON:  04/26/2018 FINDINGS: Acute comminuted fracture involving the tuft of the fourth distal phalanx with volar displacement of distal fracture fragments. Overlying soft tissue injury. No subluxation. IMPRESSION: Acute comminuted and displaced  fracture involving the tuft of the fourth distal phalanx Electronically Signed   By: Donavan Foil M.D.   On: 05/14/2018 23:07    Procedures Procedures (including critical care time)  Medications Ordered in ED Medications - No data to  display   Initial Impression / Assessment and Plan / ED Course  I have reviewed the triage vital signs and the nursing notes.  Pertinent labs & imaging results that were available during my care of the patient were reviewed by me and considered in my medical decision making (see chart for details).  Clinical Course as of Sep 13 0000  Thu May 14, 2018  2234 With history of frequent falls and alcohol abuse here after a fall at home.  Sounds like he was on the floor for maybe over an hour before he finally pushed his life alert button.  Medics on scene says there was extensive blood loss.  The patient himself seems in good spirits and does not have any real specific complaints.  He has had is all wrapped up along with bandages over his right elbow and his left hand.  He is got a lot of crusting blood in his swollen right eyelid.  Skin he needs CT imaging of his head and neck and blood work to see what his hemoglobin is.  Likely will need some wound repairs.   [MB]    Clinical Course User Index [MB] Hayden Rasmussen, MD   77 year old male presents with an unwittnessed fall. He has a known history of the same. He is clinically intoxicated. He was hypotensive with EMS. This is improved in the ED with no further episodes. He is alert and oriented. CT head, C-spine was obtained as well as right elbow and left hand.   Imaging is negative for acute fracture. Xray of the left hand again demonstrates distal tuft fracture. Wounds were extensively cleaned and inspected. There is no obvious scalp wound. He does have skin tears over the right elbow and left hand but nothing to suture. His tetanus was updated.   CBC is remarkable for hemoglobin of 9. It was 12.1 on 8/25. Discussed with Dr. Myna Hidalgo who does not recommend admission at this time but would like orthostatics checked as well as repeat HGB. If he is very orthostatic or hgb drops to 8.0 will reconsult. CT of maxface was also added to reassess facial  fractures. Care was transferred to A Law PA-C at shift change.  Final Clinical Impressions(s) / ED Diagnoses   Final diagnoses:  Fall, initial encounter  Alcoholic intoxication without complication ([place])  Multiple skin tears  Anemia, unspecified type    ED Discharge Orders    None       Recardo Evangelist, PA-C 05/15/18 0113

## 2018-06-05 ENCOUNTER — Other Ambulatory Visit: Payer: Self-pay | Admitting: Orthopedic Surgery

## 2018-06-05 DIAGNOSIS — M545 Low back pain, unspecified: Secondary | ICD-10-CM

## 2018-06-16 ENCOUNTER — Ambulatory Visit
Admission: RE | Admit: 2018-06-16 | Discharge: 2018-06-16 | Disposition: A | Discharge status: Home / Self Care | Payer: Medicare Other | Source: Ambulatory Visit | Attending: Orthopedic Surgery | Admitting: Orthopedic Surgery

## 2018-06-16 DIAGNOSIS — M545 Low back pain, unspecified: Secondary | ICD-10-CM

## 2018-06-24 ENCOUNTER — Telehealth: Payer: Self-pay

## 2018-06-24 ENCOUNTER — Other Ambulatory Visit: Payer: Self-pay | Admitting: Orthopedic Surgery

## 2018-06-24 DIAGNOSIS — M533 Sacrococcygeal disorders, not elsewhere classified: Secondary | ICD-10-CM

## 2018-06-24 NOTE — Telephone Encounter (Signed)
Spoke with patient to let him know his 13-hour prep has been called in [facility] [place].  Prednisone 50mg  PO 113/19 @ 2230, 07/06/18 @ 0430 and 1030.  Bendaryl 50mg  PO 07/06/18. Gypsy Lore, RN

## 2018-07-06 ENCOUNTER — Inpatient Hospital Stay
Admission: RE | Admit: 2018-07-06 | Discharge: 2018-07-06 | Disposition: A | Discharge status: Home / Self Care | Payer: Medicare Other | Source: Ambulatory Visit | Attending: Orthopedic Surgery | Admitting: Orthopedic Surgery

## 2018-07-06 ENCOUNTER — Inpatient Hospital Stay: Admission: RE | Admit: 2018-07-06 | Payer: Medicare Other | Source: Ambulatory Visit

## 2018-07-06 ENCOUNTER — Other Ambulatory Visit: Payer: Medicare Other

## 2018-07-06 NOTE — Discharge Instructions (Signed)
Post Procedure Spinal Discharge Instruction Sheet  1. You may resume a regular diet and any medications that you routinely take (including pain medications).  2. No driving [place] of procedure.  3. Light activity throughout the rest of the [place].  Do not do any strenuous work, exercise, bending or lifting.  The [place] following the procedure, you can resume normal physical activity but you should refrain from exercising or physical therapy for at least three days thereafter.   Common Side Effects:   Headaches- take your usual medications as directed by your physician.  Increase your fluid intake.  Caffeinated beverages may be helpful.  Lie flat in bed until your headache resolves.   Restlessness or inability to sleep- you may have trouble sleeping for the next few days.  Ask your referring physician if you need any medication for sleep.   Facial flushing or redness- should subside within a few days.   Increased pain- a temporary increase in pain a [place] or two following your procedure is not unusual.  Take your pain medication as prescribed by your referring physician.   Leg cramps  Please contact our office at [phone] for the following symptoms:  Fever greater than 100 degrees.  Headaches unresolved with medication after 2-3 days.  Increased swelling, pain, or redness at injection site.  YOU MAY RESTART YOUR PLAVIX TODAY. 

## 2018-07-18 ENCOUNTER — Other Ambulatory Visit: Payer: Self-pay

## 2018-07-18 ENCOUNTER — Observation Stay (HOSPITAL_COMMUNITY)
Admission: EM | Admit: 2018-07-18 | Discharge: 2018-07-22 | Disposition: A | Discharge status: Home / Self Care | Payer: Medicare Other | Attending: Internal Medicine | Admitting: Internal Medicine

## 2018-07-18 ENCOUNTER — Encounter (HOSPITAL_COMMUNITY): Payer: Self-pay | Admitting: Emergency Medicine

## 2018-07-18 DIAGNOSIS — W010XXA Fall on same level from slipping, tripping and stumbling without subsequent striking against object, initial encounter: Secondary | ICD-10-CM | POA: Insufficient documentation

## 2018-07-18 DIAGNOSIS — I2510 Atherosclerotic heart disease of native coronary artery without angina pectoris: Secondary | ICD-10-CM | POA: Diagnosis not present

## 2018-07-18 DIAGNOSIS — Y92000 Kitchen of unspecified non-institutional (private) residence as  the place of occurrence of the external cause: Secondary | ICD-10-CM | POA: Insufficient documentation

## 2018-07-18 DIAGNOSIS — N179 Acute kidney failure, unspecified: Secondary | ICD-10-CM | POA: Diagnosis not present

## 2018-07-18 DIAGNOSIS — K219 Gastro-esophageal reflux disease without esophagitis: Secondary | ICD-10-CM | POA: Insufficient documentation

## 2018-07-18 DIAGNOSIS — E785 Hyperlipidemia, unspecified: Secondary | ICD-10-CM | POA: Diagnosis not present

## 2018-07-18 DIAGNOSIS — E876 Hypokalemia: Secondary | ICD-10-CM | POA: Diagnosis not present

## 2018-07-18 DIAGNOSIS — Z981 Arthrodesis status: Secondary | ICD-10-CM | POA: Insufficient documentation

## 2018-07-18 DIAGNOSIS — Z881 Allergy status to other antibiotic agents status: Secondary | ICD-10-CM | POA: Insufficient documentation

## 2018-07-18 DIAGNOSIS — I255 Ischemic cardiomyopathy: Secondary | ICD-10-CM | POA: Diagnosis not present

## 2018-07-18 DIAGNOSIS — J449 Chronic obstructive pulmonary disease, unspecified: Secondary | ICD-10-CM | POA: Diagnosis not present

## 2018-07-18 DIAGNOSIS — S42211A Unspecified displaced fracture of surgical neck of right humerus, initial encounter for closed fracture: Secondary | ICD-10-CM | POA: Diagnosis present

## 2018-07-18 DIAGNOSIS — Z8709 Personal history of other diseases of the respiratory system: Secondary | ICD-10-CM | POA: Diagnosis not present

## 2018-07-18 DIAGNOSIS — E871 Hypo-osmolality and hyponatremia: Secondary | ICD-10-CM | POA: Diagnosis present

## 2018-07-18 DIAGNOSIS — F329 Major depressive disorder, single episode, unspecified: Secondary | ICD-10-CM | POA: Insufficient documentation

## 2018-07-18 DIAGNOSIS — D509 Iron deficiency anemia, unspecified: Secondary | ICD-10-CM | POA: Diagnosis not present

## 2018-07-18 DIAGNOSIS — Z8673 Personal history of transient ischemic attack (TIA), and cerebral infarction without residual deficits: Secondary | ICD-10-CM | POA: Insufficient documentation

## 2018-07-18 DIAGNOSIS — E875 Hyperkalemia: Secondary | ICD-10-CM | POA: Insufficient documentation

## 2018-07-18 DIAGNOSIS — F17210 Nicotine dependence, cigarettes, uncomplicated: Secondary | ICD-10-CM | POA: Diagnosis not present

## 2018-07-18 DIAGNOSIS — I5181 Takotsubo syndrome: Secondary | ICD-10-CM | POA: Insufficient documentation

## 2018-07-18 DIAGNOSIS — Z8249 Family history of ischemic heart disease and other diseases of the circulatory system: Secondary | ICD-10-CM | POA: Insufficient documentation

## 2018-07-18 DIAGNOSIS — Z79899 Other long term (current) drug therapy: Secondary | ICD-10-CM | POA: Insufficient documentation

## 2018-07-18 DIAGNOSIS — I10 Essential (primary) hypertension: Secondary | ICD-10-CM | POA: Diagnosis not present

## 2018-07-18 DIAGNOSIS — W19XXXA Unspecified fall, initial encounter: Secondary | ICD-10-CM

## 2018-07-18 DIAGNOSIS — F1010 Alcohol abuse, uncomplicated: Secondary | ICD-10-CM | POA: Diagnosis not present

## 2018-07-18 DIAGNOSIS — G8929 Other chronic pain: Secondary | ICD-10-CM | POA: Insufficient documentation

## 2018-07-18 NOTE — ED Triage Notes (Signed)
Patient slipped on ice cube at home, fell backward and hit his head and his back.  Was taking Plavix, stopped taking it 3 days ago per MD order.  Patient has a previous right shoulder fracture.  CAOX4.  Complaining of back pain and head pain.

## 2018-07-19 ENCOUNTER — Emergency Department (HOSPITAL_COMMUNITY): Payer: Medicare Other

## 2018-07-19 ENCOUNTER — Encounter (HOSPITAL_COMMUNITY): Payer: Self-pay | Admitting: *Deleted

## 2018-07-19 ENCOUNTER — Other Ambulatory Visit: Payer: Self-pay

## 2018-07-19 DIAGNOSIS — F1010 Alcohol abuse, uncomplicated: Secondary | ICD-10-CM

## 2018-07-19 DIAGNOSIS — I10 Essential (primary) hypertension: Secondary | ICD-10-CM | POA: Diagnosis not present

## 2018-07-19 DIAGNOSIS — E871 Hypo-osmolality and hyponatremia: Secondary | ICD-10-CM | POA: Diagnosis not present

## 2018-07-19 DIAGNOSIS — S42211A Unspecified displaced fracture of surgical neck of right humerus, initial encounter for closed fracture: Secondary | ICD-10-CM | POA: Diagnosis not present

## 2018-07-19 DIAGNOSIS — F329 Major depressive disorder, single episode, unspecified: Secondary | ICD-10-CM | POA: Diagnosis not present

## 2018-07-19 DIAGNOSIS — W19XXXA Unspecified fall, initial encounter: Secondary | ICD-10-CM

## 2018-07-19 LAB — COMPREHENSIVE METABOLIC PANEL
ALT: 18 U/L (ref 0–44)
AST: 38 U/L (ref 15–41)
Albumin: 3.1 g/dL — ABNORMAL LOW (ref 3.5–5.0)
Alkaline Phosphatase: 155 U/L — ABNORMAL HIGH (ref 38–126)
Anion gap: 11 (ref 5–15)
BILIRUBIN TOTAL: 1.2 mg/dL (ref 0.3–1.2)
BUN: 17 mg/dL (ref 8–23)
CHLORIDE: 91 mmol/L — AB (ref 98–111)
CO2: 24 mmol/L (ref 22–32)
Calcium: 8.3 mg/dL — ABNORMAL LOW (ref 8.9–10.3)
Creatinine, Ser: 1.29 mg/dL — ABNORMAL HIGH (ref 0.61–1.24)
GFR, EST NON AFRICAN AMERICAN: 53 mL/min — AB (ref >60)
Glucose, Bld: 88 mg/dL (ref 70–99)
Potassium: 3.7 mmol/L (ref 3.5–5.1)
Sodium: 126 mmol/L — ABNORMAL LOW (ref 135–145)
TOTAL PROTEIN: 7.1 g/dL (ref 6.5–8.1)

## 2018-07-19 LAB — CBC WITH DIFFERENTIAL/PLATELET
Abs Immature Granulocytes: 0.03 10*3/uL (ref 0.00–0.07)
BASOS PCT: 0 %
Basophils Absolute: 0 10*3/uL (ref 0.0–0.1)
EOS ABS: 0 10*3/uL (ref 0.0–0.5)
EOS PCT: 0 %
HEMATOCRIT: 33.4 % — AB (ref 39.0–52.0)
Hemoglobin: 10.5 g/dL — ABNORMAL LOW (ref 13.0–17.0)
IMMATURE GRANULOCYTES: 0 %
LYMPHS ABS: 2 10*3/uL (ref 0.7–4.0)
Lymphocytes Relative: 21 %
MCH: 32 pg (ref 26.0–34.0)
MCHC: 31.4 g/dL (ref 30.0–36.0)
MCV: 101.8 fL — AB (ref 80.0–100.0)
MONO ABS: 0.8 10*3/uL (ref 0.1–1.0)
MONOS PCT: 9 %
Neutro Abs: 6.7 10*3/uL (ref 1.7–7.7)
Neutrophils Relative %: 70 %
Platelets: 414 10*3/uL — ABNORMAL HIGH (ref 150–400)
RBC: 3.28 MIL/uL — ABNORMAL LOW (ref 4.22–5.81)
RDW: 13.7 % (ref 11.5–15.5)
WBC: 9.6 10*3/uL (ref 4.0–10.5)
nRBC: 0 % (ref 0.0–0.2)

## 2018-07-19 LAB — PROTIME-INR
INR: 1.02
Prothrombin Time: 13.3 seconds (ref 11.4–15.2)

## 2018-07-19 LAB — TROPONIN I

## 2018-07-19 LAB — APTT: APTT: 29 s (ref 24–36)

## 2018-07-19 LAB — ETHANOL: Alcohol, Ethyl (B): <10 mg/dL (ref <10)

## 2018-07-19 LAB — BRAIN NATRIURETIC PEPTIDE: B NATRIURETIC PEPTIDE 5: 266.5 pg/mL — AB (ref 0.0–100.0)

## 2018-07-19 MED ORDER — OXYCODONE-ACETAMINOPHEN 5-325 MG PO TABS
1.0000 | ORAL_TABLET | ORAL | Status: DC | PRN
  Administered 2018-07-19: 1 via ORAL
  Filled 2018-07-19: qty 1

## 2018-07-19 MED ORDER — ACETAMINOPHEN 325 MG PO TABS
650.0000 mg | ORAL_TABLET | Freq: Four times a day (QID) | ORAL | Status: DC | PRN
  Filled 2018-07-19: qty 2

## 2018-07-19 MED ORDER — MORPHINE SULFATE (PF) 2 MG/ML IV SOLN
2.0000 mg | INTRAVENOUS | Status: DC | PRN
  Administered 2018-07-19: 2 mg via INTRAVENOUS
  Filled 2018-07-19: qty 1

## 2018-07-19 MED ORDER — IRBESARTAN 75 MG PO TABS
75.0000 mg | ORAL_TABLET | Freq: Every day | ORAL | Status: DC
  Administered 2018-07-19 – 2018-07-20 (×2): 75 mg via ORAL
  Filled 2018-07-19 (×3): qty 1

## 2018-07-19 MED ORDER — MOMETASONE FURO-FORMOTEROL FUM 200-5 MCG/ACT IN AERO
2.0000 | INHALATION_SPRAY | Freq: Two times a day (BID) | RESPIRATORY_TRACT | Status: DC
  Administered 2018-07-19 – 2018-07-21 (×5): 2 via RESPIRATORY_TRACT
  Filled 2018-07-19: qty 8.8

## 2018-07-19 MED ORDER — SODIUM CHLORIDE 0.9 % IV BOLUS: 1000.0000 mL | Freq: Once | INTRAVENOUS | Status: DC

## 2018-07-19 MED ORDER — LORAZEPAM 2 MG/ML IJ SOLN: 1.0000 mg | Freq: Four times a day (QID) | INTRAMUSCULAR | Status: DC | PRN

## 2018-07-19 MED ORDER — NITROGLYCERIN 0.4 MG SL SUBL: 0.4000 mg | SUBLINGUAL_TABLET | SUBLINGUAL | Status: DC | PRN

## 2018-07-19 MED ORDER — ACETAMINOPHEN 325 MG PO TABS
650.0000 mg | ORAL_TABLET | Freq: Four times a day (QID) | ORAL | Status: DC
  Administered 2018-07-19 – 2018-07-22 (×12): 650 mg via ORAL
  Filled 2018-07-19 (×11): qty 2

## 2018-07-19 MED ORDER — MIRABEGRON ER 50 MG PO TB24
50.0000 mg | ORAL_TABLET | Freq: Every day | ORAL | Status: DC
  Administered 2018-07-19 – 2018-07-22 (×4): 50 mg via ORAL
  Filled 2018-07-19 (×4): qty 1

## 2018-07-19 MED ORDER — METOPROLOL SUCCINATE ER 50 MG PO TB24
50.0000 mg | ORAL_TABLET | Freq: Every day | ORAL | Status: DC
  Administered 2018-07-19 – 2018-07-22 (×4): 50 mg via ORAL
  Filled 2018-07-19 (×4): qty 1

## 2018-07-19 MED ORDER — ONDANSETRON HCL 4 MG PO TABS: 4.0000 mg | ORAL_TABLET | Freq: Four times a day (QID) | ORAL | Status: DC | PRN

## 2018-07-19 MED ORDER — ZOLPIDEM TARTRATE 5 MG PO TABS
5.0000 mg | ORAL_TABLET | Freq: Every evening | ORAL | Status: DC | PRN
  Administered 2018-07-20: 5 mg via ORAL
  Filled 2018-07-19: qty 1

## 2018-07-19 MED ORDER — SENNOSIDES-DOCUSATE SODIUM 8.6-50 MG PO TABS
2.0000 | ORAL_TABLET | Freq: Once | ORAL | Status: DC
  Filled 2018-07-19: qty 2

## 2018-07-19 MED ORDER — LORAZEPAM 2 MG/ML IJ SOLN: 0.0000 mg | Freq: Two times a day (BID) | INTRAMUSCULAR | Status: DC

## 2018-07-19 MED ORDER — ONDANSETRON HCL 4 MG/2ML IJ SOLN: 4.0000 mg | Freq: Four times a day (QID) | INTRAMUSCULAR | Status: DC | PRN

## 2018-07-19 MED ORDER — ONDANSETRON HCL 4 MG/2ML IJ SOLN: 4.0000 mg | Freq: Three times a day (TID) | INTRAMUSCULAR | Status: DC | PRN

## 2018-07-19 MED ORDER — ENOXAPARIN SODIUM 40 MG/0.4ML ~~LOC~~ SOLN
40.0000 mg | SUBCUTANEOUS | Status: DC
  Administered 2018-07-19 – 2018-07-21 (×3): 40 mg via SUBCUTANEOUS
  Filled 2018-07-19 (×2): qty 0.4

## 2018-07-19 MED ORDER — SODIUM CHLORIDE 0.9 % IV BOLUS: 250.0000 mL | Freq: Once | INTRAVENOUS | Status: DC

## 2018-07-19 MED ORDER — ATORVASTATIN CALCIUM 80 MG PO TABS
80.0000 mg | ORAL_TABLET | Freq: Every day | ORAL | Status: DC
  Administered 2018-07-19 – 2018-07-22 (×4): 80 mg via ORAL
  Filled 2018-07-19 (×4): qty 1

## 2018-07-19 MED ORDER — ACETAMINOPHEN 650 MG RE SUPP: 650.0000 mg | Freq: Four times a day (QID) | RECTAL | Status: DC | PRN

## 2018-07-19 MED ORDER — SODIUM CHLORIDE 0.9 % IV SOLN
INTRAVENOUS | Status: DC
  Administered 2018-07-19 – 2018-07-20 (×2): via INTRAVENOUS

## 2018-07-19 MED ORDER — METHOCARBAMOL 500 MG PO TABS
500.0000 mg | ORAL_TABLET | Freq: Three times a day (TID) | ORAL | Status: DC | PRN
  Administered 2018-07-19 – 2018-07-21 (×3): 500 mg via ORAL
  Filled 2018-07-19 (×3): qty 1

## 2018-07-19 MED ORDER — HYDRALAZINE HCL 20 MG/ML IJ SOLN
5.0000 mg | INTRAMUSCULAR | Status: DC | PRN
  Administered 2018-07-19 (×2): 5 mg via INTRAVENOUS
  Filled 2018-07-19 (×2): qty 1

## 2018-07-19 MED ORDER — UMECLIDINIUM BROMIDE 62.5 MCG/INH IN AEPB
1.0000 | INHALATION_SPRAY | Freq: Every day | RESPIRATORY_TRACT | Status: DC
  Administered 2018-07-20 – 2018-07-21 (×2): 1 via RESPIRATORY_TRACT
  Filled 2018-07-19: qty 7

## 2018-07-19 MED ORDER — ALBUTEROL SULFATE (2.5 MG/3ML) 0.083% IN NEBU: 2.5000 mg | INHALATION_SOLUTION | RESPIRATORY_TRACT | Status: DC | PRN

## 2018-07-19 MED ORDER — ACETAMINOPHEN 325 MG PO TABS: 650.0000 mg | ORAL_TABLET | Freq: Four times a day (QID) | ORAL | Status: DC | PRN

## 2018-07-19 MED ORDER — PANTOPRAZOLE SODIUM 40 MG PO TBEC
40.0000 mg | DELAYED_RELEASE_TABLET | Freq: Every day | ORAL | Status: DC
  Administered 2018-07-19 – 2018-07-22 (×4): 40 mg via ORAL
  Filled 2018-07-19 (×4): qty 1

## 2018-07-19 MED ORDER — ALBUTEROL SULFATE HFA 108 (90 BASE) MCG/ACT IN AERS
2.0000 | INHALATION_SPRAY | Freq: Four times a day (QID) | RESPIRATORY_TRACT | Status: DC | PRN
  Filled 2018-07-19: qty 6.7

## 2018-07-19 MED ORDER — LORAZEPAM 1 MG PO TABS: 1.0000 mg | ORAL_TABLET | Freq: Four times a day (QID) | ORAL | Status: DC | PRN

## 2018-07-19 MED ORDER — FOLIC ACID 400 MCG PO TABS: 400.0000 ug | ORAL_TABLET | Freq: Every day | ORAL | Status: DC

## 2018-07-19 MED ORDER — VITAMIN B-1 100 MG PO TABS
100.0000 mg | ORAL_TABLET | Freq: Every day | ORAL | Status: DC
  Administered 2018-07-19 – 2018-07-22 (×4): 100 mg via ORAL
  Filled 2018-07-19 (×5): qty 1

## 2018-07-19 MED ORDER — ADULT MULTIVITAMIN W/MINERALS CH
1.0000 | ORAL_TABLET | Freq: Every day | ORAL | Status: DC
  Administered 2018-07-19 – 2018-07-22 (×4): 1 via ORAL
  Filled 2018-07-19 (×5): qty 1

## 2018-07-19 MED ORDER — FOLIC ACID 1 MG PO TABS
1.0000 mg | ORAL_TABLET | Freq: Every day | ORAL | Status: DC
  Administered 2018-07-19 – 2018-07-22 (×4): 1 mg via ORAL
  Filled 2018-07-19 (×4): qty 1

## 2018-07-19 MED ORDER — TIOTROPIUM BROMIDE MONOHYDRATE 2.5 MCG/ACT IN AERS: 2.0000 | INHALATION_SPRAY | Freq: Every day | RESPIRATORY_TRACT | Status: DC

## 2018-07-19 MED ORDER — NICOTINE 21 MG/24HR TD PT24
21.0000 mg | MEDICATED_PATCH | Freq: Every day | TRANSDERMAL | Status: DC
  Administered 2018-07-19 – 2018-07-22 (×4): 21 mg via TRANSDERMAL
  Filled 2018-07-19 (×5): qty 1

## 2018-07-19 MED ORDER — LORAZEPAM 2 MG/ML IJ SOLN: 0.0000 mg | Freq: Four times a day (QID) | INTRAMUSCULAR | Status: DC

## 2018-07-19 MED ORDER — THIAMINE HCL 100 MG/ML IJ SOLN
100.0000 mg | Freq: Every day | INTRAMUSCULAR | Status: DC
  Administered 2018-07-20: 100 mg via INTRAVENOUS
  Filled 2018-07-19: qty 2

## 2018-07-19 MED ORDER — DULOXETINE HCL 60 MG PO CPEP
60.0000 mg | ORAL_CAPSULE | Freq: Every day | ORAL | Status: DC
  Administered 2018-07-19 – 2018-07-22 (×4): 60 mg via ORAL
  Filled 2018-07-19 (×4): qty 1

## 2018-07-19 MED ORDER — ESCITALOPRAM OXALATE 10 MG PO TABS
10.0000 mg | ORAL_TABLET | Freq: Every day | ORAL | Status: DC
  Administered 2018-07-19 – 2018-07-22 (×4): 10 mg via ORAL
  Filled 2018-07-19 (×4): qty 1

## 2018-07-19 NOTE — ED Notes (Signed)
Pt noted to be soiled w/urine while attempting to use urinal - bedside cleaning being performed.

## 2018-07-19 NOTE — ED Notes (Signed)
Patient transported to CT and xray 

## 2018-07-19 NOTE — ED Notes (Addendum)
Secretary paged Admitting MD x 2 inquiring about diet for pt - order received for reg diet.

## 2018-07-19 NOTE — Care Management (Addendum)
This is a no charge note  Pending admission per Dr. Wyvonnia Dusky  77 year old male with past medical history of hypertension, hyperlipidemia, COPD, stroke, GERD, tobacco abuse, tobacco abuse, depression, BPH, CAD, gastric ulcer, CHF with EF 35%, who presents with fall.  Patient already has known right proximal humerus Fx wearing sling at home.  ED physician did multiple images, which showed subacute right proximal humerus fracture. Pt also has hyponatremia with sodium 126-->start NS at 75 cc/h.  EDP will consult ortho. Pt is placed on the tele bed for obs (due to no new fracture, I did no not put him as inpt) humerus.  Ivor Costa, MD  [facility] Pager [phone]  If 7PM-7AM, please contact night-coverage [email or web address] Password [email or web address] 07/19/2018, 2:26 AM

## 2018-07-19 NOTE — ED Notes (Signed)
MD in w pt

## 2018-07-19 NOTE — Consult Note (Signed)
Reason for Consult: Displaced Humeral Neck Fracture Referring Physician: Ezequiel Essex, MD  Juanda Bond Ramella is an 77 y.o. male.  HPI: Ukiah Trawick Schomburg is a 77 y.o. male who is an established patient with Dr. Lynann Bologna.   Patient presented from home after fall.  States he slipped on some ice on the floor and fell backwards striking his head, back and right elbow.  He had fallen a week or so before and had a known right humerus fracture and is currently in a sling.  Admits to having several alcoholic drinks tonight.  When he got home from eating at a restaurant he laid down on the couch for a little bit was a bit dizzy when he got up.  He went to the refrigerator and slipped on some ice and hit his head.  He did not lose consciousness.  He has been on Plavix but not had it for several days.  Complains of pain to his right elbow, right shoulder, head, neck and back.  Denies any focal weakness in his arms or legs.  No numbness or tingling. With his previous injury and now the current injury he has a displaced right humeral neck fracture.  Past Medical History:  Diagnosis Date  . Alcohol withdrawal ([place])   . Allergy    seasonal  . Arthritis   . Barrett esophagus   . BPH (benign prostatic hyperplasia)   . CAD (coronary artery disease)    a. s/p multiple POBA last in 2011 to distal OM2. b. Takotsubo event 09/2017 with cath showing no culprit, 25% prox LAD, 60% OM1, 65% OM2, EF 35-40% by cath and 30-35% by echo.  . Chronic back pain   . COPD (chronic obstructive pulmonary disease) ([facility])    Albuterol inhaler as needed.Uses Symbicort daily  . Depression   . Emphysema lung ([place])   . Fall    fell on 10/06/16 with loss of consiousness for a short time.States he remembers getting up but remembers nothing else.  . Gastric ulcer   . Gastritis   . GERD (gastroesophageal reflux disease)    takes Omeprazole daily  . History of colon polyps    benign  . History of kidney stones   . Hyperlipidemia   .  Hypertension   . Hyponatremia   . 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 Neuropathy   . Nocturia   . Pneumonia    hx of  . PONV (postoperative nausea and vomiting)   . Skin cancer   . Stroke [facility]) 1995   Affected balance; and has very emotional if watching a movie  . Syncope    yr ago  . Takotsubo cardiomyopathy    a. 09/2017 - EF 35-40% by cath, 30-35% by echo.  . Urgency of urination    takes Myrbetriq daily    Past Surgical History:  Procedure Laterality Date  . APPENDECTOMY    . CARDIAC CATHETERIZATION     x 4. Most recent one in 2011 at [facility](3 previous were done 20 yrs before)  . cataracts Bilateral   . CHEST EXPLORATION Left 11/24/2017   Procedure: CHEST EXPLORATION;  Surgeon: [name], MD;  Location: [place];  Service: Thoracic;  Laterality: Left;  . COLONOSCOPY    . CYSTOSCOPY  07/21/2013   Dr. Jeffie Pollock  . DENTAL SURGERY     with sedation  . ESOPHAGEAL MANOMETRY N/A 09/20/2015   Procedure: ESOPHAGEAL MANOMETRY (EM);  Surgeon: Manus Gunning, MD;  Location: [facility];  Service: Gastroenterology;  Laterality: N/A;  . LEFT HEART CATH AND CORONARY ANGIOGRAPHY N/A 09/15/2017   Procedure: LEFT HEART CATH AND CORONARY ANGIOGRAPHY;  Surgeon: Martinique, Peter M, MD;  Location: [place] [facility];  Service: Cardiovascular;  Laterality: N/A;  . LUMBAR LAMINECTOMY/DECOMPRESSION MICRODISCECTOMY N/A 01/12/2015   Procedure: LUMBAR LAMINECTOMY/DECOMPRESSION MICRODISCECTOMY 3 LEVELS;  Surgeon: Phylliss Bob, MD;  Location: [place];  Service: Orthopedics;  Laterality: N/A;  Lumbar 3-4, lumbar 4-5, lumbar 5-sacrum 1 decompression  . parathyroid adenoma removed    . POLYPECTOMY    . ROTATOR CUFF REPAIR     bilateral  . TONSILLECTOMY    . UPPER GASTROINTESTINAL ENDOSCOPY    . VIDEO ASSISTED THORACOSCOPY (VATS)/EMPYEMA Left 11/24/2017   Procedure: VIDEO ASSISTED THORACOSCOPY (VATS)/EMPYEMA;  Surgeon: [name], MD;  Location: [place];  Service: Thoracic;  Laterality: Left;  Marland Kitchen VIDEO BRONCHOSCOPY N/A 11/24/2017   Procedure: VIDEO BRONCHOSCOPY;  Surgeon: [name], MD;  Location: [facility];  Service: Thoracic;  Laterality: N/A;    Family History  Problem Relation Age of Onset  . Stroke Mother   . Rheum arthritis Mother   . Heart disease Father   . Colon cancer Unknown        ? mat. uncle died age 25  . Colon polyps Neg Hx   . Esophageal cancer Neg Hx   . Rectal cancer Neg Hx   . Stomach cancer Neg Hx     Social History:  reports that he has been smoking cigarettes. He has a 87.00 pack-year smoking history. He has never used smokeless tobacco. He reports that he drinks about 14.0 standard drinks of alcohol per week. He reports that he does not use drugs.  Allergies:  Allergies  Allergen Reactions  . Iodinated Diagnostic Agents Hives, Shortness Of Breath and Rash    Reaction was when pt was 77 years old   . Macrodantin [Nitrofurantoin] Other (See Comments)    Fever that lasted several months    Medications: I have reviewed the patient's current medications. Prior to Admission:  (Not in a hospital admission)  Results for orders placed or performed during the hospital encounter of 07/18/18 (from the past 48 hour(s))  CBC with Differential/Platelet     Status: Abnormal   Collection Time: 07/19/18 12:28 AM  Result Value Ref Range   WBC 9.6 4.0 - 10.5 K/uL   RBC 3.28 (L) 4.22 - 5.81 MIL/uL   Hemoglobin 10.5 (L) 13.0 - 17.0 g/dL   HCT 33.4 (L) 39.0 - 52.0 %   MCV 101.8 (H) 80.0 - 100.0 fL   MCH 32.0 26.0 - 34.0 pg   MCHC 31.4 30.0 - 36.0 g/dL   RDW 13.7 11.5 - 15.5 %   Platelets 414 (H) 150 - 400 K/uL   nRBC 0.0 0.0 - 0.2 %   Neutrophils Relative % 70 %   Neutro Abs 6.7 1.7 - 7.7 K/uL   Lymphocytes Relative 21 %   Lymphs Abs 2.0 0.7 - 4.0 K/uL   Monocytes Relative 9 %   Monocytes Absolute 0.8 0.1 - 1.0 K/uL   Eosinophils Relative 0 %   Eosinophils Absolute 0.0 0.0 - 0.5 K/uL   Basophils  Relative 0 %   Basophils Absolute 0.0 0.0 - 0.1 K/uL   Immature Granulocytes 0 %   Abs Immature Granulocytes 0.03 0.00 - 0.07 K/uL    Comment: Performed at [facility], [address] [place], [place] [address]  Comprehensive metabolic panel     Status: Abnormal   Collection Time: 07/19/18 12:28 AM  Result Value Ref Range   Sodium 126 (L) 135 - 145 mmol/L   Potassium 3.7 3.5 - 5.1 mmol/L   Chloride 91 (L) 98 - 111 mmol/L   CO2 24 22 - 32 mmol/L   Glucose, Bld 88 70 - 99 mg/dL   BUN 17 8 - 23 mg/dL   Creatinine, Ser 1.29 (H) 0.61 - 1.24 mg/dL   Calcium 8.3 (L) 8.9 - 10.3 mg/dL   Total Protein 7.1 6.5 - 8.1 g/dL   Albumin 3.1 (L) 3.5 - 5.0 g/dL   AST 38 15 - 41 U/L   ALT 18 0 - 44 U/L   Alkaline Phosphatase 155 (H) 38 - 126 U/L   Total Bilirubin 1.2 0.3 - 1.2 mg/dL   GFR calc non Af Amer 53 (L) >60 mL/min   GFR calc Af Amer >60 >60 mL/min    Comment: (NOTE) The eGFR has been calculated using the CKD EPI equation. This calculation has not been validated in all clinical situations. eGFR's persistently <60 mL/min signify possible Chronic Kidney Disease.    Anion gap 11 5 - 15    Comment: Performed at [facility] [address] [place], [place] [address]  Troponin I - ONCE - STAT     Status: None   Collection Time: 07/19/18 12:38 AM  Result Value Ref Range   Troponin I <0.03 <0.03 ng/mL    Comment: Performed at [facility] [address], [place], [place] [address]  Ethanol     Status: None   Collection Time: 07/19/18  2:01 AM  Result Value Ref Range   Alcohol, Ethyl (B) <10 <10 mg/dL    Comment: (NOTE) Lowest detectable limit for serum alcohol is 10 mg/dL. For medical purposes only. Performed at [facility], [place] [address] [place], [place] [address]   Protime-INR     Status: None   Collection Time: 07/19/18  2:38 AM  Result Value Ref Range   Prothrombin Time 13.3 11.4 - 15.2 seconds   INR 1.02     Comment: Performed at [facility] [address], [place], [place] [address]  APTT     Status: None   Collection Time: 07/19/18  2:38 AM  Result Value Ref Range   aPTT 29 24 - 36 seconds    Comment: Performed at [facility] [address], [place], [place] [address]  Brain natriuretic peptide     Status: Abnormal   Collection Time: 07/19/18  2:38 AM  Result Value Ref Range   B Natriuretic Peptide 266.5 (H) 0.0 - 100.0 pg/mL    Comment: Performed at [facility] [address], [place],  [address]    Dg Chest 1 View  Result Date: 07/19/2018 CLINICAL DATA:  Status post fall. Back pain. EXAM: CHEST  1 VIEW COMPARISON:  04/26/2018, 03/11/2018 FINDINGS: There is stable blunting of the left costophrenic angle which may reflect a small pleural effusion versus scarring. There is stable left basilar airspace disease which may reflect atelectasis versus scarring. There is no right pleural effusion. There is no pneumothorax. The heart and mediastinal contours are unremarkable. The osseous structures are unremarkable. IMPRESSION: Stable blunting of the left costophrenic angle which may reflect a small pleural effusion versus scarring. Stable left basilar airspace disease which may reflect atelectasis versus scarring.  Electronically Signed   By: Kathreen Devoid   On: 07/19/2018 01:36   Dg Thoracic Spine 2 View  Result Date: 07/19/2018 CLINICAL DATA:  Status post fall, back pain EXAM: THORACIC SPINE 2 VIEWS COMPARISON:  None. FINDINGS: There is no evidence of thoracic spine fracture. Generalized osteopenia. Alignment is normal. No other significant bone abnormalities are identified. Mild degenerative disc disease throughout the thoracic spine. IMPRESSION: No acute osseous injury of the thoracic spine. Electronically Signed   By: Kathreen Devoid   On: 07/19/2018 01:40   Dg Lumbar Spine 2-3 Views  Result Date: 07/19/2018 CLINICAL DATA:  Status post fall, back pain EXAM: LUMBAR SPINE - 2-3 VIEW COMPARISON:  CT  lumbar spine 06/16/2018 FINDINGS: There is no evidence of lumbar spine fracture.  Alignment is normal. Mild disc space loss at L2-3. Posterior spinal fusion from L4 through S1. Bilateral L4 pedicle screws demonstrate lucency around the screws consistent with loosening with the tip eroding through the superior endplate into the G9-5 disc space. Thoracic aortic atherosclerosis. IMPRESSION: 1.  No acute osseous injury of the lumbar spine. Electronically Signed   By: Kathreen Devoid   On: 07/19/2018 01:44   Dg Shoulder Right  Result Date: 07/19/2018 CLINICAL DATA:  Status post fall, right shoulder pain EXAM: RIGHT SHOULDER - 2+ VIEW COMPARISON:  None. FINDINGS: Generalized osteopenia. Displaced fracture of the surgical neck of the right proximal humerus with 11 mm of lateral displacement. No glenohumeral dislocation. Mild arthropathy of the acromioclavicular joint and glenohumeral joint. Small bone island in the proximal humeral diaphysis. IMPRESSION: Displaced fracture of the surgical neck of the right proximal humerus. Electronically Signed   By: Kathreen Devoid   On: 07/19/2018 01:38   Dg Elbow Complete Right  Result Date: 07/19/2018 CLINICAL DATA:  Status post fall, right elbow pain EXAM: RIGHT ELBOW - COMPLETE 3+ VIEW COMPARISON:  None. FINDINGS: There is no evidence of fracture, dislocation, or joint effusion. There is no evidence of arthropathy or other focal bone abnormality. Soft tissues are unremarkable. IMPRESSION: Negative. Electronically Signed   By: Kathreen Devoid   On: 07/19/2018 01:39   Dg Forearm Right  Result Date: 07/19/2018 CLINICAL DATA:  Fall EXAM: RIGHT FOREARM - 2 VIEW COMPARISON:  None. FINDINGS: There is no evidence of fracture or other focal bone lesions. Soft tissues are unremarkable. IMPRESSION: Negative. Electronically Signed   By: Ulyses Jarred M.D.   On: 07/19/2018 01:42   Ct Head Wo Contrast  Result Date: 07/19/2018 CLINICAL DATA:  Slipped on ice cube. Fell backward and hit  head and back. Concern for cervical spine injury. Patient was on Plavix until 3 days ago. Initial encounter. EXAM: CT HEAD WITHOUT CONTRAST CT CERVICAL SPINE WITHOUT CONTRAST TECHNIQUE: Multidetector CT imaging of the head and cervical spine was performed following the standard protocol without intravenous contrast. Multiplanar CT image reconstructions of the cervical spine were also generated. COMPARISON:  CT of the head and cervical spine performed 05/14/2018 FINDINGS: CT HEAD FINDINGS Brain: No evidence of acute infarction, hemorrhage, hydrocephalus, extra-axial collection or mass lesion / mass effect. Prominence of the ventricles and sulci reflects mild cortical volume loss. Mild cerebellar atrophy is noted. Scattered periventricular and subcortical white matter change likely reflects small vessel ischemic microangiopathy. Small chronic lacunar infarcts are noted at the basal ganglia bilaterally, and at the right thalamus. The brainstem and fourth ventricle are within normal limits. The cerebral hemispheres demonstrate grossly normal gray-white differentiation. No mass effect or midline shift is seen. Vascular: No  hyperdense vessel or unexpected calcification. Skull: There is no evidence of fracture; visualized osseous structures are unremarkable in appearance. Sinuses/Orbits: The orbits are within normal limits. There is mild partial opacification of the mastoid air cells bilaterally. The paranasal sinuses are well-aerated. Other: No significant soft tissue abnormalities are seen. CT CERVICAL SPINE FINDINGS Alignment: There is grade 1 anterolisthesis of C2 on C3 and of C3 on C4, reflecting underlying facet disease. Skull base and vertebrae: No acute fracture. No primary bone lesion or focal pathologic process. Soft tissues and spinal canal: No prevertebral fluid or swelling. No visible canal hematoma. Disc levels: Multilevel disc space narrowing is noted along the lower cervical spine, with scattered anterior  and posterior disc osteophyte complexes, and underlying bony foraminal narrowing. Upper chest: Hypodensities within the right thyroid lobe measure up to 1.3 cm in size, likely benign given their size. Scattered calcification is noted at the carotid bifurcations bilaterally. Other: No additional soft tissue abnormalities are seen. IMPRESSION: 1. No evidence of traumatic intracranial injury or fracture. 2. No evidence of fracture or subluxation along the cervical spine. 3. Mild cortical volume loss and scattered small vessel ischemic microangiopathy. 4. Small chronic lacunar infarcts at the basal ganglia bilaterally, and at the right thalamus. 5. Mild degenerative change along the cervical spine. 6. Mild partial opacification of the mastoid air cells bilaterally. 7. Scattered calcification at the carotid bifurcations bilaterally. Carotid ultrasound would be helpful for further evaluation, when and as deemed clinically appropriate. Electronically Signed   By: Garald Balding M.D.   On: 07/19/2018 01:05   Ct Cervical Spine Wo Contrast  Result Date: 07/19/2018 CLINICAL DATA:  Slipped on ice cube. Fell backward and hit head and back. Concern for cervical spine injury. Patient was on Plavix until 3 days ago. Initial encounter. EXAM: CT HEAD WITHOUT CONTRAST CT CERVICAL SPINE WITHOUT CONTRAST TECHNIQUE: Multidetector CT imaging of the head and cervical spine was performed following the standard protocol without intravenous contrast. Multiplanar CT image reconstructions of the cervical spine were also generated. COMPARISON:  CT of the head and cervical spine performed 05/14/2018 FINDINGS: CT HEAD FINDINGS Brain: No evidence of acute infarction, hemorrhage, hydrocephalus, extra-axial collection or mass lesion / mass effect. Prominence of the ventricles and sulci reflects mild cortical volume loss. Mild cerebellar atrophy is noted. Scattered periventricular and subcortical white matter change likely reflects small vessel  ischemic microangiopathy. Small chronic lacunar infarcts are noted at the basal ganglia bilaterally, and at the right thalamus. The brainstem and fourth ventricle are within normal limits. The cerebral hemispheres demonstrate grossly normal gray-white differentiation. No mass effect or midline shift is seen. Vascular: No hyperdense vessel or unexpected calcification. Skull: There is no evidence of fracture; visualized osseous structures are unremarkable in appearance. Sinuses/Orbits: The orbits are within normal limits. There is mild partial opacification of the mastoid air cells bilaterally. The paranasal sinuses are well-aerated. Other: No significant soft tissue abnormalities are seen. CT CERVICAL SPINE FINDINGS Alignment: There is grade 1 anterolisthesis of C2 on C3 and of C3 on C4, reflecting underlying facet disease. Skull base and vertebrae: No acute fracture. No primary bone lesion or focal pathologic process. Soft tissues and spinal canal: No prevertebral fluid or swelling. No visible canal hematoma. Disc levels: Multilevel disc space narrowing is noted along the lower cervical spine, with scattered anterior and posterior disc osteophyte complexes, and underlying bony foraminal narrowing. Upper chest: Hypodensities within the right thyroid lobe measure up to 1.3 cm in size, likely benign given their size. Scattered  calcification is noted at the carotid bifurcations bilaterally. Other: No additional soft tissue abnormalities are seen. IMPRESSION: 1. No evidence of traumatic intracranial injury or fracture. 2. No evidence of fracture or subluxation along the cervical spine. 3. Mild cortical volume loss and scattered small vessel ischemic microangiopathy. 4. Small chronic lacunar infarcts at the basal ganglia bilaterally, and at the right thalamus. 5. Mild degenerative change along the cervical spine. 6. Mild partial opacification of the mastoid air cells bilaterally. 7. Scattered calcification at the carotid  bifurcations bilaterally. Carotid ultrasound would be helpful for further evaluation, when and as deemed clinically appropriate. Electronically Signed   By: Garald Balding M.D.   On: 07/19/2018 01:05   Dg Humerus Right  Result Date: 07/19/2018 CLINICAL DATA:  Fall EXAM: RIGHT HUMERUS - 2+ VIEW COMPARISON:  Right shoulder radiograph same [place] FINDINGS: Redemonstration of angulated fracture of the proximal right humerus. No distal humerus fracture. IMPRESSION: No distal humerus fracture. The angulated fracture of the proximal humerus is better demonstrated on the earlier shoulder radiograph. Electronically Signed   By: Ulyses Jarred M.D.   On: 07/19/2018 01:42    ROS Blood pressure (!) 165/97, pulse 83, temperature 98.2 F (36.8 C), temperature source Oral, resp. rate 13, SpO2 91 %. Physical Exam  Assessment/Plan: Right displaced humeral neck fracture.    I have discussed this Pt with Dr. Mayer Camel.  The plan to [place] is to have the pt remain in a cradle sling and discharge home when medically stable.  He will follow up in our office with Dr. [name]  with in 1 week to discuss ORIF vs conservative treatment.  He is to be non weight bearing with regards to the right shoulder.  There is some concern that given the 2 recent falls and lack of help at home that he may go back to to drinking and fall again.  If any questions arise please feel free to call.  [name] 07/19/2018, 9:06 AM

## 2018-07-19 NOTE — ED Notes (Signed)
Sling noted to right arm - pt able to wiggle fingers w/o difficulty. Pt able to use urinal - declines condom cath.

## 2018-07-19 NOTE — ED Notes (Signed)
Aware NPO.

## 2018-07-19 NOTE — ED Notes (Signed)
Condom cath applied by EMT - pt voiced agreement.

## 2018-07-19 NOTE — ED Provider Notes (Signed)
[facility] EMERGENCY DEPARTMENT Provider Note   CSN: [id number] Arrival date & time: 07/18/18  2345     History   Chief Complaint Chief Complaint  Patient presents with  . Fall    HPI Brad Turner is a 77 y.o. male.  Patient presents from home after fall.  States he slipped on some ice on the floor and fell backwards striking his head, back and right elbow.  States he has a known right humerus fracture and is currently in a sling.  Admits to having several alcoholic drinks tonight.  When he got home from eating at a restaurant he laid down on the couch for a little bit was a bit dizzy when he got up.  He went to the refrigerator and slipped on some ice and hit his head.  He did not lose consciousness.  He has been on Plavix but not had it for several days.  Complains of pain to his right elbow, right shoulder, head, neck and back.  Denies any focal weakness in his arms or legs.  No numbness or tingling.  Tetanus shot is up-to-date.  The history is provided by the patient and the EMS personnel.  Fall  Associated symptoms include headaches. Pertinent negatives include no chest pain, no abdominal pain and no shortness of breath.    Past Medical History:  Diagnosis Date  . Alcohol withdrawal ([place])   . Allergy    seasonal  . Arthritis   . Barrett esophagus   . BPH (benign prostatic hyperplasia)   . CAD (coronary artery disease)    a. s/p multiple POBA last in 2011 to distal OM2. b. Takotsubo event 09/2017 with cath showing no culprit, 25% prox LAD, 60% OM1, 65% OM2, EF 35-40% by cath and 30-35% by echo.  . Chronic back pain   . COPD (chronic obstructive pulmonary disease) (HCC)    Albuterol inhaler as needed.Uses Symbicort daily  . Depression   . Emphysema lung ([place])   . Fall    fell on 10/06/16 with loss of consiousness for a short time.States he remembers getting up but remembers nothing else.  . Gastric ulcer   . Gastritis   . GERD (gastroesophageal reflux  disease)    takes Omeprazole daily  . History of colon polyps    benign  . History of kidney stones   . Hyperlipidemia   . Hypertension   . Hyponatremia   . 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 Neuropathy   . Nocturia   . Pneumonia    hx of  . PONV (postoperative nausea and vomiting)   . Skin cancer   . Stroke [facility]) 1995   Affected balance; and has very emotional if watching a movie  . Syncope    yr ago  . Takotsubo cardiomyopathy    a. 09/2017 - EF 35-40% by cath, 30-35% by echo.  . Urgency of urination    takes Myrbetriq daily    Patient Active Problem List   Diagnosis Date Noted  . Nail deformity 05/08/2018  . Fall 04/26/2018  . Nasal bone fracture 04/26/2018  . Maxillary sinus fracture ([place]) 04/26/2018  . Multiple skin tears   . Alcoholic intoxication without complication ([place])   . Liver mass 01/01/2018  . Adjustment disorder with depressed mood 12/29/2017  . Neurocognitive disorder 12/21/2017  . Adult failure to thrive 12/09/2017  . Pressure injury of skin 12/01/2017  .  Empyema ([place]) 11/24/2017  . Iron deficiency anemia 11/22/2017  . Acute urinary retention 11/22/2017  . Hypokalemia   . Hypomagnesemia   . Parapneumonic effusion   . Sepsis due to pneumonia ([place])   . ETOH abuse 11/21/2017  . Acute respiratory failure ([place]) 11/21/2017  . HCAP (healthcare-associated pneumonia) 11/21/2017  . Sepsis ([place]) 11/21/2017  . Pleural effusion 11/21/2017  . Acute kidney injury ([place]) 11/21/2017  . Abdominal pain 11/21/2017  . Malnutrition of moderate degree 09/24/2017  . Acute delirium 09/16/2017  . Acute hyponatremia 09/16/2017  . Alcohol withdrawal ([place]) 09/16/2017  . 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 Tobacco abuse 09/16/2017  . Depression 09/16/2017  . History of CVA (cerebrovascular accident) 09/16/2017  . History of Barrett's esophagus 09/16/2017  . Takotsubo cardiomyopathy  09/15/2017  . Chronic back pain 12/01/2016  . S/P lumbar spinal fusion 12/01/2016  . Dysthymia 12/01/2016  . Radiculopathy 10/17/2016  . Abnormal CT of the chest 06/10/2016  . Dysphagia   . Essential hypertension, benign 10/30/2014    Class: Chronic  . Syncope 10/29/2014  . Cough syncope 10/07/2014  . COPD with acute exacerbation ([place]) 09/29/2014  . Pulmonary nodules 11/22/2013  . Personal history of colonic polyps 06/04/2010  . COPD (chronic obstructive pulmonary disease) with emphysema ([place]) 02/26/2010  . CHEST PAIN, ATYPICAL 02/26/2010  . BARRETTS ESOPHAGUS 05/18/2009  . HLD (hyperlipidemia) 04/28/2008  . Coronary atherosclerosis 04/28/2008  . CVA 04/28/2008  . GERD 04/28/2008  . ARTHRITIS 04/28/2008  . NEPHROLITHIASIS, HX OF 04/28/2008    Past Surgical History:  Procedure Laterality Date  . APPENDECTOMY    . CARDIAC CATHETERIZATION     x 4. Most recent one in 2011 at [facility](3 previous were done 20 yrs before)  . cataracts Bilateral   . CHEST EXPLORATION Left 11/24/2017   Procedure: CHEST EXPLORATION;  Surgeon: [name], MD;  Location: [facility];  Service: Thoracic;  Laterality: Left;  . COLONOSCOPY    . CYSTOSCOPY  07/21/2013   Dr. Jeffie Pollock  . DENTAL SURGERY     with sedation  . ESOPHAGEAL MANOMETRY N/A 09/20/2015   Procedure: ESOPHAGEAL MANOMETRY (EM);  Surgeon: Manus Gunning, MD;  Location: [facility];  Service: Gastroenterology;  Laterality: N/A;  . LEFT HEART CATH AND CORONARY ANGIOGRAPHY N/A 09/15/2017   Procedure: LEFT HEART CATH AND CORONARY ANGIOGRAPHY;  Surgeon: Martinique, Peter M, MD;  Location: [facility];  Service: Cardiovascular;  Laterality: N/A;  . LUMBAR LAMINECTOMY/DECOMPRESSION MICRODISCECTOMY N/A 01/12/2015   Procedure: LUMBAR LAMINECTOMY/DECOMPRESSION MICRODISCECTOMY 3 LEVELS;  Surgeon: Phylliss Bob, MD;  Location: [facility];  Service: Orthopedics;  Laterality: N/A;  Lumbar 3-4, lumbar 4-5, lumbar 5-sacrum 1 decompression  . parathyroid  adenoma removed    . POLYPECTOMY    . ROTATOR CUFF REPAIR     bilateral  . TONSILLECTOMY    . UPPER GASTROINTESTINAL ENDOSCOPY    . VIDEO ASSISTED THORACOSCOPY (VATS)/EMPYEMA Left 11/24/2017   Procedure: VIDEO ASSISTED THORACOSCOPY (VATS)/EMPYEMA;  Surgeon: [name], MD;  Location: [facility];  Service: Thoracic;  Laterality: Left;  Marland Kitchen VIDEO BRONCHOSCOPY N/A 11/24/2017   Procedure: VIDEO BRONCHOSCOPY;  Surgeon: [name], MD;  Location: [facility] OR;  Service: Thoracic;  Laterality: N/A;        Home Medications    Prior to Admission medications   Medication Sig Start Date End Date Taking? Authorizing Provider  acetaminophen (TYLENOL) 650 MG CR tablet Take 650 mg by mouth every 8 (eight) hours as needed  for pain.     [provider]  albuterol (PROVENTIL HFA;VENTOLIN HFA) 108 (90 BASE) MCG/ACT inhaler Inhale 2 puffs into the lungs every 6 (six) hours as needed for wheezing or shortness of breath. Reported on 11/29/2015    [provider]  arformoterol (BROVANA) 15 MCG/2ML NEBU Take 2 mLs (15 mcg total) by nebulization 2 (two) times daily. Patient taking differently: Take 15 mcg by nebulization daily as needed (for COPD-related symptoms).  02/09/18   Noralee Space, MD  atorvastatin (LIPITOR) 80 MG tablet Take 80 mg by mouth daily.     [provider]  cephALEXin (KEFLEX) 500 MG capsule Take 1 capsule (500 mg total) by mouth every 8 (eight) hours. Patient not taking: Reported on 05/15/2018 04/26/18   Domenic Polite, MD  clopidogrel (PLAVIX) 75 MG tablet Take 1 tablet (75 mg total) by mouth daily. Restart in 7-10days after follow up with ENT Dr,[place] Patient taking differently: Take 75 mg by mouth daily.  05/08/18   Domenic Polite, MD  DULoxetine (CYMBALTA) 60 MG capsule Take 60 mg by mouth daily.    [provider]  ENSURE (ENSURE) Take 237 mLs by mouth 2 (two) times daily between meals.    [provider]  escitalopram (LEXAPRO) 10 MG  tablet Take 10 mg by mouth daily. 04/08/18   [provider]  folic acid (FOLVITE) 1 MG tablet Take 1 tablet (1 mg total) by mouth daily. Patient not taking: Reported on 05/15/2018 09/19/17   Raiford Noble Latif, DO  folic acid (FOLVITE) 161 MCG tablet Take 400 mcg by mouth daily.    [provider]  metoprolol succinate (TOPROL-XL) 50 MG 24 hr tablet Take 1 tablet (50 mg total) by mouth daily. Take with or immediately following a meal. 09/19/17   Duke, Tami Lin, PA  mupirocin ointment (BACTROBAN) 2 % Apply 1 application topically See admin instructions. Apply to hands and arms 1-2 times a [place] 05/06/18   [provider]  MYRBETRIQ 50 MG TB24 tablet Take 50 mg by mouth daily.  05/03/15   [provider]  NITROSTAT 0.4 MG SL tablet Place 0.4 mg under the tongue every 5 (five) minutes as needed for chest pain. Reported on 11/29/2015 05/29/13   [provider]  omeprazole (PRILOSEC) 40 MG capsule TAKE ONE CAPSULE BY MOUTH TWICE DAILY Patient taking differently: Take 40 mg by mouth 2 (two) times daily.  04/22/16   Armbruster, Carlota Raspberry, MD  sodium chloride (OCEAN) 0.65 % SOLN nasal spray Place 1 spray into both nostrils as needed for congestion. Patient not taking: Reported on 05/15/2018 04/26/18   Domenic Polite, MD  [facility] 160-4.5 MCG/ACT inhaler INHALE TWO PUFFS INTO LUNGS TWICE DAILY Patient taking differently: Inhale 2 puffs into the lungs 2 (two) times daily.  04/19/14   ClanceArmando Reichert, MD  Tiotropium Bromide Monohydrate (SPIRIVA RESPIMAT) 2.5 MCG/ACT AERS Inhale 2 puffs into the lungs daily.     [provider]  valsartan (DIOVAN) 160 MG tablet Take 160 mg by mouth daily. 04/07/18   [provider]    Family History Family History  Problem Relation Age of Onset  . Stroke Mother   . Rheum arthritis Mother   . Heart disease Father   . Colon cancer Unknown        ? mat. uncle died age 42  . Colon polyps Neg Hx   . Esophageal cancer Neg  Hx   . Rectal cancer Neg Hx   . Stomach  cancer Neg Hx     Social History Social History   Tobacco Use  . Smoking status: Current Every Junker Smoker    Packs/[place]: 1.50    Years: 58.00    Pack years: 87.00    Types: Cigarettes  . Smokeless tobacco: Never Used  Substance Use Topics  . Alcohol use: Yes    Alcohol/week: 14.0 standard drinks    Types: 14 Standard drinks or equivalent per week    Comment: every Erich, vodka- 1-2 a Renn   . Drug use: No     Allergies   Iodinated diagnostic agents and Macrodantin [nitrofurantoin]   Review of Systems Review of Systems  Constitutional: Negative for activity change, appetite change and fever.  Respiratory: Negative for cough, chest tightness and shortness of breath.   Cardiovascular: Negative for chest pain.  Gastrointestinal: Negative for abdominal pain, nausea and vomiting.  Musculoskeletal: Positive for arthralgias, back pain, myalgias and neck pain.  Skin: Positive for wound.  Neurological: Positive for dizziness, light-headedness and headaches. Negative for weakness.    all other systems are negative except as noted in the HPI and PMH.    Physical Exam Updated Vital Signs BP (!) 187/87 (BP Location: Right Arm)   Pulse 80   Temp 98.2 F (36.8 C) (Oral)   Resp 16   SpO2 98%   Physical Exam  Constitutional: He is oriented to person, place, and time. He appears well-developed and well-nourished. No distress.  HENT:  Head: Normocephalic and atraumatic.  Mouth/Throat: Oropharynx is clear and moist. No oropharyngeal exudate.  Eyes: Pupils are equal, round, and reactive to light. Conjunctivae and EOM are normal.  Neck: Normal range of motion. Neck supple.  Paraspinal C-spine tenderness No midline tenderness  Cardiovascular: Normal rate, regular rhythm, normal heart sounds and intact distal pulses.  No murmur heard. Pulmonary/Chest: Effort normal and breath sounds normal. No respiratory distress. He exhibits no tenderness.    Abdominal: Soft. There is no tenderness. There is no rebound and no guarding.  Musculoskeletal: He exhibits edema and tenderness.  Right arm in sling.  There is tenderness to the right anterior and lateral shoulder without obvious deformity.  Tenderness across right elbow with skin tear overlying right forearm and right elbow.  Intact radial pulse, intact cardinal hand movements  Diffuse thoracic and lumbar spinous process tenderness. Full range of motion of hips without pain  Neurological: He is alert and oriented to person, place, and time. No cranial nerve deficit. He exhibits normal muscle tone. Coordination normal.  No ataxia on finger to nose bilaterally. No pronator drift. 5/5 strength throughout. CN 2-12 intact.Equal grip strength. Sensation intact.   Skin: Skin is warm.  Psychiatric: He has a normal mood and affect. His behavior is normal.  Nursing note and vitals reviewed.    ED Treatments / Results  Labs (all labs ordered are listed, but only abnormal results are displayed) Labs Reviewed  CBC WITH DIFFERENTIAL/PLATELET - Abnormal; Notable for the following components:      Result Value   RBC 3.28 (*)    Hemoglobin 10.5 (*)    HCT 33.4 (*)    MCV 101.8 (*)    Platelets 414 (*)    All other components within normal limits  COMPREHENSIVE METABOLIC PANEL - Abnormal; Notable for the following components:   Sodium 126 (*)    Chloride 91 (*)    Creatinine, Ser 1.29 (*)    Calcium 8.3 (*)    Albumin 3.1 (*)    Alkaline  Phosphatase 155 (*)    GFR calc non Af Amer 53 (*)    All other components within normal limits  TROPONIN I  ETHANOL    EKG None  Radiology Dg Chest 1 View  Result Date: 07/19/2018 CLINICAL DATA:  Status post fall. Back pain. EXAM: CHEST  1 VIEW COMPARISON:  04/26/2018, 03/11/2018 FINDINGS: There is stable blunting of the left costophrenic angle which may reflect a small pleural effusion versus scarring. There is stable left basilar airspace  disease which may reflect atelectasis versus scarring. There is no right pleural effusion. There is no pneumothorax. The heart and mediastinal contours are unremarkable. The osseous structures are unremarkable. IMPRESSION: Stable blunting of the left costophrenic angle which may reflect a small pleural effusion versus scarring. Stable left basilar airspace disease which may reflect atelectasis versus scarring. Electronically Signed   By: Kathreen Devoid   On: 07/19/2018 01:36   Dg Thoracic Spine 2 View  Result Date: 07/19/2018 CLINICAL DATA:  Status post fall, back pain EXAM: THORACIC SPINE 2 VIEWS COMPARISON:  None. FINDINGS: There is no evidence of thoracic spine fracture. Generalized osteopenia. Alignment is normal. No other significant bone abnormalities are identified. Mild degenerative disc disease throughout the thoracic spine. IMPRESSION: No acute osseous injury of the thoracic spine. Electronically Signed   By: Kathreen Devoid   On: 07/19/2018 01:40   Dg Lumbar Spine 2-3 Views  Result Date: 07/19/2018 CLINICAL DATA:  Status post fall, back pain EXAM: LUMBAR SPINE - 2-3 VIEW COMPARISON:  CT lumbar spine 06/16/2018 FINDINGS: There is no evidence of lumbar spine fracture.  Alignment is normal. Mild disc space loss at L2-3. Posterior spinal fusion from L4 through S1. Bilateral L4 pedicle screws demonstrate lucency around the screws consistent with loosening with the tip eroding through the superior endplate into the J8-8 disc space. Thoracic aortic atherosclerosis. IMPRESSION: 1.  No acute osseous injury of the lumbar spine. Electronically Signed   By: Kathreen Devoid   On: 07/19/2018 01:44   Dg Shoulder Right  Result Date: 07/19/2018 CLINICAL DATA:  Status post fall, right shoulder pain EXAM: RIGHT SHOULDER - 2+ VIEW COMPARISON:  None. FINDINGS: Generalized osteopenia. Displaced fracture of the surgical neck of the right proximal humerus with 11 mm of lateral displacement. No glenohumeral dislocation.  Mild arthropathy of the acromioclavicular joint and glenohumeral joint. Small bone island in the proximal humeral diaphysis. IMPRESSION: Displaced fracture of the surgical neck of the right proximal humerus. Electronically Signed   By: Kathreen Devoid   On: 07/19/2018 01:38   Dg Elbow Complete Right  Result Date: 07/19/2018 CLINICAL DATA:  Status post fall, right elbow pain EXAM: RIGHT ELBOW - COMPLETE 3+ VIEW COMPARISON:  None. FINDINGS: There is no evidence of fracture, dislocation, or joint effusion. There is no evidence of arthropathy or other focal bone abnormality. Soft tissues are unremarkable. IMPRESSION: Negative. Electronically Signed   By: Kathreen Devoid   On: 07/19/2018 01:39   Dg Forearm Right  Result Date: 07/19/2018 CLINICAL DATA:  Fall EXAM: RIGHT FOREARM - 2 VIEW COMPARISON:  None. FINDINGS: There is no evidence of fracture or other focal bone lesions. Soft tissues are unremarkable. IMPRESSION: Negative. Electronically Signed   By: Ulyses Jarred M.D.   On: 07/19/2018 01:42   Ct Head Wo Contrast  Result Date: 07/19/2018 CLINICAL DATA:  Slipped on ice cube. Fell backward and hit head and back. Concern for cervical spine injury. Patient was on Plavix until 3 days ago. Initial encounter. EXAM: CT  HEAD WITHOUT CONTRAST CT CERVICAL SPINE WITHOUT CONTRAST TECHNIQUE: Multidetector CT imaging of the head and cervical spine was performed following the standard protocol without intravenous contrast. Multiplanar CT image reconstructions of the cervical spine were also generated. COMPARISON:  CT of the head and cervical spine performed 05/14/2018 FINDINGS: CT HEAD FINDINGS Brain: No evidence of acute infarction, hemorrhage, hydrocephalus, extra-axial collection or mass lesion / mass effect. Prominence of the ventricles and sulci reflects mild cortical volume loss. Mild cerebellar atrophy is noted. Scattered periventricular and subcortical white matter change likely reflects small vessel ischemic  microangiopathy. Small chronic lacunar infarcts are noted at the basal ganglia bilaterally, and at the right thalamus. The brainstem and fourth ventricle are within normal limits. The cerebral hemispheres demonstrate grossly normal gray-white differentiation. No mass effect or midline shift is seen. Vascular: No hyperdense vessel or unexpected calcification. Skull: There is no evidence of fracture; visualized osseous structures are unremarkable in appearance. Sinuses/Orbits: The orbits are within normal limits. There is mild partial opacification of the mastoid air cells bilaterally. The paranasal sinuses are well-aerated. Other: No significant soft tissue abnormalities are seen. CT CERVICAL SPINE FINDINGS Alignment: There is grade 1 anterolisthesis of C2 on C3 and of C3 on C4, reflecting underlying facet disease. Skull base and vertebrae: No acute fracture. No primary bone lesion or focal pathologic process. Soft tissues and spinal canal: No prevertebral fluid or swelling. No visible canal hematoma. Disc levels: Multilevel disc space narrowing is noted along the lower cervical spine, with scattered anterior and posterior disc osteophyte complexes, and underlying bony foraminal narrowing. Upper chest: Hypodensities within the right thyroid lobe measure up to 1.3 cm in size, likely benign given their size. Scattered calcification is noted at the carotid bifurcations bilaterally. Other: No additional soft tissue abnormalities are seen. IMPRESSION: 1. No evidence of traumatic intracranial injury or fracture. 2. No evidence of fracture or subluxation along the cervical spine. 3. Mild cortical volume loss and scattered small vessel ischemic microangiopathy. 4. Small chronic lacunar infarcts at the basal ganglia bilaterally, and at the right thalamus. 5. Mild degenerative change along the cervical spine. 6. Mild partial opacification of the mastoid air cells bilaterally. 7. Scattered calcification at the carotid  bifurcations bilaterally. Carotid ultrasound would be helpful for further evaluation, when and as deemed clinically appropriate. Electronically Signed   By: Garald Balding M.D.   On: 07/19/2018 01:05   Ct Cervical Spine Wo Contrast  Result Date: 07/19/2018 CLINICAL DATA:  Slipped on ice cube. Fell backward and hit head and back. Concern for cervical spine injury. Patient was on Plavix until 3 days ago. Initial encounter. EXAM: CT HEAD WITHOUT CONTRAST CT CERVICAL SPINE WITHOUT CONTRAST TECHNIQUE: Multidetector CT imaging of the head and cervical spine was performed following the standard protocol without intravenous contrast. Multiplanar CT image reconstructions of the cervical spine were also generated. COMPARISON:  CT of the head and cervical spine performed 05/14/2018 FINDINGS: CT HEAD FINDINGS Brain: No evidence of acute infarction, hemorrhage, hydrocephalus, extra-axial collection or mass lesion / mass effect. Prominence of the ventricles and sulci reflects mild cortical volume loss. Mild cerebellar atrophy is noted. Scattered periventricular and subcortical white matter change likely reflects small vessel ischemic microangiopathy. Small chronic lacunar infarcts are noted at the basal ganglia bilaterally, and at the right thalamus. The brainstem and fourth ventricle are within normal limits. The cerebral hemispheres demonstrate grossly normal gray-white differentiation. No mass effect or midline shift is seen. Vascular: No hyperdense vessel or unexpected calcification. Skull: There is  no evidence of fracture; visualized osseous structures are unremarkable in appearance. Sinuses/Orbits: The orbits are within normal limits. There is mild partial opacification of the mastoid air cells bilaterally. The paranasal sinuses are well-aerated. Other: No significant soft tissue abnormalities are seen. CT CERVICAL SPINE FINDINGS Alignment: There is grade 1 anterolisthesis of C2 on C3 and of C3 on C4, reflecting  underlying facet disease. Skull base and vertebrae: No acute fracture. No primary bone lesion or focal pathologic process. Soft tissues and spinal canal: No prevertebral fluid or swelling. No visible canal hematoma. Disc levels: Multilevel disc space narrowing is noted along the lower cervical spine, with scattered anterior and posterior disc osteophyte complexes, and underlying bony foraminal narrowing. Upper chest: Hypodensities within the right thyroid lobe measure up to 1.3 cm in size, likely benign given their size. Scattered calcification is noted at the carotid bifurcations bilaterally. Other: No additional soft tissue abnormalities are seen. IMPRESSION: 1. No evidence of traumatic intracranial injury or fracture. 2. No evidence of fracture or subluxation along the cervical spine. 3. Mild cortical volume loss and scattered small vessel ischemic microangiopathy. 4. Small chronic lacunar infarcts at the basal ganglia bilaterally, and at the right thalamus. 5. Mild degenerative change along the cervical spine. 6. Mild partial opacification of the mastoid air cells bilaterally. 7. Scattered calcification at the carotid bifurcations bilaterally. Carotid ultrasound would be helpful for further evaluation, when and as deemed clinically appropriate. Electronically Signed   By: Garald Balding M.D.   On: 07/19/2018 01:05   Dg Humerus Right  Result Date: 07/19/2018 CLINICAL DATA:  Fall EXAM: RIGHT HUMERUS - 2+ VIEW COMPARISON:  Right shoulder radiograph same [place] FINDINGS: Redemonstration of angulated fracture of the proximal right humerus. No distal humerus fracture. IMPRESSION: No distal humerus fracture. The angulated fracture of the proximal humerus is better demonstrated on the earlier shoulder radiograph. Electronically Signed   By: Ulyses Jarred M.D.   On: 07/19/2018 01:42    Procedures Procedures (including critical care time)  Medications Ordered in ED Medications - No data to display   Initial  Impression / Assessment and Plan / ED Course  I have reviewed the triage vital signs and the nursing notes.  Pertinent labs & imaging results that were available during my care of the patient were reviewed by me and considered in my medical decision making (see chart for details).    Mechanical fall with head, neck, shoulder and elbow injury. No LOC. Has not had plavix for several days.  Traumatic imaging of head and neck is negative.  Displaced humeral neck fracture seen.  Per patient's report this is likely subacute.  D/w Dr. Mayer Camel covering for Dr. Lynann Bologna.  He was able to view patient's previous x-ray from November 2.  Patient has had more lateral displacement today.  He recommends simple sling and he will see in the morning.  Labs show AKI with hyponatremia which is likely secondary to his alcohol abuse.  Skin tears cleaned, sling placed on shoulder fracture.  Discussed with patient that he should be admitted given his hyponatremia and risk for seizures.  Traumatic work-up is reassuring other than subacute right proximal humerus fracture.  Wounds cleaned, tetanus is up-to-date. Given patient's hyponatremia will admit for observation.  Discussed with Dr. Blaine Hamper.   Final Clinical Impressions(s) / ED Diagnoses   Final diagnoses:  Fall, initial encounter  Hyponatremia  Fracture of humerus neck, right, closed, initial encounter    ED Discharge Orders    None  Ezequiel Essex, MD 07/19/18 [id number]

## 2018-07-19 NOTE — H&P (Addendum)
History and Physical    Brad Turner Cid GYJ:[id number] DOB: [birth year] DOA: 07/18/2018  PCP: Haywood Pao, MD   Patient coming from: Home   Chief Complaint: right arm pain.   HPI: Brad Turner is a 77 y.o. male with medical history significant of COPD with emphysema, GERD, history of stroke, arthritis, ambulatory dysfunction, and recent crush injury to his left ring finger.  Patient suffered a mechanical fall, he slipped while ambulating with his walker, he landed on his right side, positive head trauma but no loss of consciousness, post event he had significant pain on his right arm.  Currently right arm pain is 10 out of 10 in intensity, sharp in nature, worse with movement, improved with immobility, no radiation, no associated nausea or vomiting.   ED Course: Patient was diagnosed with a right proximal femur fracture complicated by hyponatremia, referred for further pain control, electrolyte correction and orthopedic evaluation.  Review of Systems:  1. General: No fevers, no chills, no weight gain or weight loss 2. ENT: No runny nose or sore throat, no hearing disturbances 3. Pulmonary: No dyspnea, cough, wheezing, or hemoptysis 4. Cardiovascular: No angina, claudication, lower extremity edema, pnd or orthopnea 5. Gastrointestinal: No nausea or vomiting, no diarrhea or constipation 6. Hematology: No easy bruisability or frequent infections 7. Urology: No dysuria, hematuria or increased urinary frequency 8. Dermatology: No rashes. 9. Neurology: No seizures or paresthesias 10. Musculoskeletal: Positive right arm pain as mentioned in the HPI.   Past Medical History:  Diagnosis Date  . Alcohol withdrawal ([place])   . Allergy    seasonal  . Arthritis   . Barrett esophagus   . BPH (benign prostatic hyperplasia)   . CAD (coronary artery disease)    a. s/p multiple POBA last in 2011 to distal OM2. b. Takotsubo event 09/2017 with cath showing no culprit, 25% prox LAD, 60% OM1, 65%  OM2, EF 35-40% by cath and 30-35% by echo.  . Chronic back pain   . COPD (chronic obstructive pulmonary disease) (H[facility])    Albuterol inhaler as needed.Uses Symbicort daily  . Depression   . Emphysema lung ([place])   . Fall    fell on 10/06/16 with loss of consiousness for a short time.States he remembers getting up but remembers nothing else.  . Gastric ulcer   . Gastritis   . GERD (gastroesophageal reflux disease)    takes Omeprazole daily  . History of colon polyps    benign  . History of kidney stones   . Hyperlipidemia   . Hypertension   . Hyponatremia   . 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 Neuropathy   . Nocturia   . Pneumonia    hx of  . PONV (postoperative nausea and vomiting)   . Skin cancer   . Stroke [facility]) 1995   Affected balance; and has very emotional if watching a movie  . Syncope    yr ago  . Takotsubo cardiomyopathy    a. 09/2017 - EF 35-40% by cath, 30-35% by echo.  . Urgency of urination    takes Myrbetriq daily    Past Surgical History:  Procedure Laterality Date  . APPENDECTOMY    . CARDIAC CATHETERIZATION     x 4. Most recent one in 2011 at [facility](3 previous were done 20 yrs before)  . cataracts Bilateral   . CHEST EXPLORATION Left 11/24/2017   Procedure: CHEST EXPLORATION;  Surgeon: Servando Snare,  Lilia Argue, MD;  Location: [place];  Service: Thoracic;  Laterality: Left;  . COLONOSCOPY    . CYSTOSCOPY  07/21/2013   Dr. Jeffie Pollock  . DENTAL SURGERY     with sedation  . ESOPHAGEAL MANOMETRY N/A 09/20/2015   Procedure: ESOPHAGEAL MANOMETRY (EM);  Surgeon: Manus Gunning, MD;  Location: [facility];  Service: Gastroenterology;  Laterality: N/A;  . LEFT HEART CATH AND CORONARY ANGIOGRAPHY N/A 09/15/2017   Procedure: LEFT HEART CATH AND CORONARY ANGIOGRAPHY;  Surgeon: Martinique, Peter M, MD;  Location: [place] [facility];  Service: Cardiovascular;  Laterality: N/A;  . LUMBAR LAMINECTOMY/DECOMPRESSION MICRODISCECTOMY N/A  01/12/2015   Procedure: LUMBAR LAMINECTOMY/DECOMPRESSION MICRODISCECTOMY 3 LEVELS;  Surgeon: Phylliss Bob, MD;  Location: [place];  Service: Orthopedics;  Laterality: N/A;  Lumbar 3-4, lumbar 4-5, lumbar 5-sacrum 1 decompression  . parathyroid adenoma removed    . POLYPECTOMY    . ROTATOR CUFF REPAIR     bilateral  . TONSILLECTOMY    . UPPER GASTROINTESTINAL ENDOSCOPY    . VIDEO ASSISTED THORACOSCOPY (VATS)/EMPYEMA Left 11/24/2017   Procedure: VIDEO ASSISTED THORACOSCOPY (VATS)/EMPYEMA;  Surgeon: [name], MD;  Location: [place];  Service: Thoracic;  Laterality: Left;  Marland Kitchen VIDEO BRONCHOSCOPY N/A 11/24/2017   Procedure: VIDEO BRONCHOSCOPY;  Surgeon: [name], MD;  Location: [place];  Service: Thoracic;  Laterality: N/A;     reports that he has been smoking cigarettes. He has a 87.00 pack-year smoking history. He has never used smokeless tobacco. He reports that he drinks about 14.0 standard drinks of alcohol per week. He reports that he does not use drugs.  Allergies  Allergen Reactions  . Iodinated Diagnostic Agents Hives, Shortness Of Breath and Rash    Reaction was when pt was 77 years old   . Macrodantin [Nitrofurantoin] Other (See Comments)    Fever that lasted several months    Family History  Problem Relation Age of Onset  . Stroke Mother   . Rheum arthritis Mother   . Heart disease Father   . Colon cancer Unknown        ? mat. uncle died age 45  . Colon polyps Neg Hx   . Esophageal cancer Neg Hx   . Rectal cancer Neg Hx   . Stomach cancer Neg Hx      Prior to Admission medications   Medication Sig Start Date End Date Taking? Authorizing Provider  acetaminophen (TYLENOL) 650 MG CR tablet Take 650 mg by mouth every 8 (eight) hours as needed for pain.    Yes [provider]  albuterol (PROVENTIL HFA;VENTOLIN HFA) 108 (90 BASE) MCG/ACT inhaler Inhale 2 puffs into the lungs every 6 (six) hours as needed for wheezing or shortness of breath. Reported on  11/29/2015   Yes [provider]  atorvastatin (LIPITOR) 80 MG tablet Take 80 mg by mouth daily.    Yes [provider]  DULoxetine (CYMBALTA) 60 MG capsule Take 60 mg by mouth daily.   Yes [provider]  ENSURE (ENSURE) Take 237 mLs by mouth 2 (two) times daily between meals.   Yes [provider]  escitalopram (LEXAPRO) 10 MG tablet Take 10 mg by mouth daily. 04/08/18  Yes [provider]  folic acid (FOLVITE) 132 MCG tablet Take 400 mcg by mouth daily.   Yes [provider]  metoprolol succinate (TOPROL-XL) 50 MG 24 hr tablet Take 1 tablet (50 mg total) by mouth daily. Take with or immediately following a meal. 09/19/17  Yes Duke, Tami Lin, PA  MYRBETRIQ 50 MG TB24 tablet Take 50 mg by mouth daily.  05/03/15  Yes [provider]  NITROSTAT 0.4 MG SL tablet Place 0.4 mg under the tongue every 5 (five) minutes as needed for chest pain. Reported on 11/29/2015 05/29/13  Yes [provider]  omeprazole (PRILOSEC) 40 MG capsule TAKE ONE CAPSULE BY MOUTH TWICE DAILY Patient taking differently: Take 40 mg by mouth 2 (two) times daily.  04/22/16  Yes Armbruster, Carlota Raspberry, MD  SYMBICORT 160-4.5 MCG/ACT inhaler INHALE TWO PUFFS INTO LUNGS TWICE DAILY Patient taking differently: Inhale 2 puffs into the lungs 2 (two) times daily.  04/19/14  Yes Clance, Armando Reichert, MD  Tiotropium Bromide Monohydrate (SPIRIVA RESPIMAT) 2.5 MCG/ACT AERS Inhale 2 puffs into the lungs daily.    Yes [provider]  valsartan (DIOVAN) 160 MG tablet Take 160 mg by mouth daily. 04/07/18  Yes [provider]  arformoterol (BROVANA) 15 MCG/2ML NEBU Take 2 mLs (15 mcg total) by nebulization 2 (two) times daily. Patient not taking: Reported on 07/19/2018 02/09/18   Noralee Space, MD  cephALEXin (KEFLEX) 500 MG capsule Take 1 capsule (500 mg total) by mouth every 8 (eight) hours. Patient not taking: Reported on 05/15/2018 04/26/18   Domenic Polite, MD    clopidogrel (PLAVIX) 75 MG tablet Take 1 tablet (75 mg total) by mouth daily. Restart in 7-10days after follow up with ENT Dr,[place] Patient not taking: Reported on 07/19/2018 05/08/18   Domenic Polite, MD  folic acid (FOLVITE) 1 MG tablet Take 1 tablet (1 mg total) by mouth daily. Patient not taking: Reported on 05/15/2018 09/19/17   Raiford Noble Latif, DO  sodium chloride (OCEAN) 0.65 % SOLN nasal spray Place 1 spray into both nostrils as needed for congestion. Patient not taking: Reported on 05/15/2018 04/26/18   Domenic Polite, MD    Physical Exam: Vitals:   07/19/18 0539 07/19/18 0544 07/19/18 0600 07/19/18 0641  BP:  (!) 163/99 (!) 180/90 (!) 157/85  Pulse: 77  77 99  Resp: 15  12   Temp:      TempSrc:      SpO2: 97%  95%     Vitals:   07/19/18 0539 07/19/18 0544 07/19/18 0600 07/19/18 0641  BP:  (!) 163/99 (!) 180/90 (!) 157/85  Pulse: 77  77 99  Resp: 15  12   Temp:      TempSrc:      SpO2: 97%  95%    General: deconditioned  Neurology: Awake and alert, non focal Head and Neck. Head normocephalic. Neck supple with no adenopathy or thyromegaly.   E ENT: mild pallor, no icterus, oral mucosa dry Cardiovascular: No JVD. S1-S2 present, rhythmic, no gallops, rubs, or murmurs. No lower extremity edema. Pulmonary: positive breath sounds bilaterally, adequate air movement, no wheezing, rhonchi or rales. Gastrointestinal. Abdomen with, no organomegaly, non tender, no rebound or guarding Skin. No rashes Musculoskeletal: right upper extremity placed on sling, tender to palpation on right shoulder and limited mobility due to pain, no significant deformity.     Labs on Admission: I have personally reviewed following labs and imaging studies  CBC: Recent Labs  Lab 07/19/18 0028  WBC 9.6  NEUTROABS 6.7  HGB 10.5*  HCT 33.4*  MCV 101.8*  PLT 341*   Basic Metabolic Panel: Recent Labs  Lab 07/19/18 0028  NA 126*  K 3.7  CL 91*  CO2 24  GLUCOSE 88  BUN 17   CREATININE  1.29*  CALCIUM 8.3*   GFR: CrCl cannot be calculated (Unknown ideal weight.). Liver Function Tests: Recent Labs  Lab 07/19/18 0028  AST 38  ALT 18  ALKPHOS 155*  BILITOT 1.2  PROT 7.1  ALBUMIN 3.1*   No results for input(s): LIPASE, AMYLASE in the last 168 hours. No results for input(s): AMMONIA in the last 168 hours. Coagulation Profile: Recent Labs  Lab 07/19/18 0238  INR 1.02   Cardiac Enzymes: Recent Labs  Lab 07/19/18 0038  TROPONINI <0.03   BNP (last 3 results) No results for input(s): PROBNP in the last 8760 hours. HbA1C: No results for input(s): HGBA1C in the last 72 hours. CBG: No results for input(s): GLUCAP in the last 168 hours. Lipid Profile: No results for input(s): CHOL, HDL, LDLCALC, TRIG, CHOLHDL, LDLDIRECT in the last 72 hours. Thyroid Function Tests: No results for input(s): TSH, T4TOTAL, FREET4, T3FREE, THYROIDAB in the last 72 hours. Anemia Panel: No results for input(s): VITAMINB12, FOLATE, FERRITIN, TIBC, IRON, RETICCTPCT in the last 72 hours. Urine analysis:    Component Value Date/Time   COLORURINE AMBER (A) 12/07/2017 1659   APPEARANCEUR HAZY (A) 12/07/2017 1659   LABSPEC 1.024 12/07/2017 1659   PHURINE 5.0 12/07/2017 1659   GLUCOSEU NEGATIVE 12/07/2017 1659   HGBUR NEGATIVE 12/07/2017 1659   BILIRUBINUR NEGATIVE 12/07/2017 1659   KETONESUR NEGATIVE 12/07/2017 1659   PROTEINUR 30 (A) 12/07/2017 1659   UROBILINOGEN 1.0 01/09/2015 1427   NITRITE NEGATIVE 12/07/2017 1659   LEUKOCYTESUR TRACE (A) 12/07/2017 1659    Radiological Exams on Admission: Dg Chest 1 View  Result Date: 07/19/2018 CLINICAL DATA:  Status post fall. Back pain. EXAM: CHEST  1 VIEW COMPARISON:  04/26/2018, 03/11/2018 FINDINGS: There is stable blunting of the left costophrenic angle which may reflect a small pleural effusion versus scarring. There is stable left basilar airspace disease which may reflect atelectasis versus scarring. There is no right  pleural effusion. There is no pneumothorax. The heart and mediastinal contours are unremarkable. The osseous structures are unremarkable. IMPRESSION: Stable blunting of the left costophrenic angle which may reflect a small pleural effusion versus scarring. Stable left basilar airspace disease which may reflect atelectasis versus scarring. Electronically Signed   By: Kathreen Devoid   On: 07/19/2018 01:36   Dg Thoracic Spine 2 View  Result Date: 07/19/2018 CLINICAL DATA:  Status post fall, back pain EXAM: THORACIC SPINE 2 VIEWS COMPARISON:  None. FINDINGS: There is no evidence of thoracic spine fracture. Generalized osteopenia. Alignment is normal. No other significant bone abnormalities are identified. Mild degenerative disc disease throughout the thoracic spine. IMPRESSION: No acute osseous injury of the thoracic spine. Electronically Signed   By: Kathreen Devoid   On: 07/19/2018 01:40   Dg Lumbar Spine 2-3 Views  Result Date: 07/19/2018 CLINICAL DATA:  Status post fall, back pain EXAM: LUMBAR SPINE - 2-3 VIEW COMPARISON:  CT lumbar spine 06/16/2018 FINDINGS: There is no evidence of lumbar spine fracture.  Alignment is normal. Mild disc space loss at L2-3. Posterior spinal fusion from L4 through S1. Bilateral L4 pedicle screws demonstrate lucency around the screws consistent with loosening with the tip eroding through the superior endplate into the O5-3 disc space. Thoracic aortic atherosclerosis. IMPRESSION: 1.  No acute osseous injury of the lumbar spine. Electronically Signed   By: Kathreen Devoid   On: 07/19/2018 01:44   Dg Shoulder Right  Result Date: 07/19/2018 CLINICAL DATA:  Status post fall, right shoulder pain EXAM: RIGHT SHOULDER - 2+ VIEW  COMPARISON:  None. FINDINGS: Generalized osteopenia. Displaced fracture of the surgical neck of the right proximal humerus with 11 mm of lateral displacement. No glenohumeral dislocation. Mild arthropathy of the acromioclavicular joint and glenohumeral joint.  Small bone island in the proximal humeral diaphysis. IMPRESSION: Displaced fracture of the surgical neck of the right proximal humerus. Electronically Signed   By: Kathreen Devoid   On: 07/19/2018 01:38   Dg Elbow Complete Right  Result Date: 07/19/2018 CLINICAL DATA:  Status post fall, right elbow pain EXAM: RIGHT ELBOW - COMPLETE 3+ VIEW COMPARISON:  None. FINDINGS: There is no evidence of fracture, dislocation, or joint effusion. There is no evidence of arthropathy or other focal bone abnormality. Soft tissues are unremarkable. IMPRESSION: Negative. Electronically Signed   By: Kathreen Devoid   On: 07/19/2018 01:39   Dg Forearm Right  Result Date: 07/19/2018 CLINICAL DATA:  Fall EXAM: RIGHT FOREARM - 2 VIEW COMPARISON:  None. FINDINGS: There is no evidence of fracture or other focal bone lesions. Soft tissues are unremarkable. IMPRESSION: Negative. Electronically Signed   By: Ulyses Jarred M.D.   On: 07/19/2018 01:42   Ct Head Wo Contrast  Result Date: 07/19/2018 CLINICAL DATA:  Slipped on ice cube. Fell backward and hit head and back. Concern for cervical spine injury. Patient was on Plavix until 3 days ago. Initial encounter. EXAM: CT HEAD WITHOUT CONTRAST CT CERVICAL SPINE WITHOUT CONTRAST TECHNIQUE: Multidetector CT imaging of the head and cervical spine was performed following the standard protocol without intravenous contrast. Multiplanar CT image reconstructions of the cervical spine were also generated. COMPARISON:  CT of the head and cervical spine performed 05/14/2018 FINDINGS: CT HEAD FINDINGS Brain: No evidence of acute infarction, hemorrhage, hydrocephalus, extra-axial collection or mass lesion / mass effect. Prominence of the ventricles and sulci reflects mild cortical volume loss. Mild cerebellar atrophy is noted. Scattered periventricular and subcortical white matter change likely reflects small vessel ischemic microangiopathy. Small chronic lacunar infarcts are noted at the basal ganglia  bilaterally, and at the right thalamus. The brainstem and fourth ventricle are within normal limits. The cerebral hemispheres demonstrate grossly normal gray-white differentiation. No mass effect or midline shift is seen. Vascular: No hyperdense vessel or unexpected calcification. Skull: There is no evidence of fracture; visualized osseous structures are unremarkable in appearance. Sinuses/Orbits: The orbits are within normal limits. There is mild partial opacification of the mastoid air cells bilaterally. The paranasal sinuses are well-aerated. Other: No significant soft tissue abnormalities are seen. CT CERVICAL SPINE FINDINGS Alignment: There is grade 1 anterolisthesis of C2 on C3 and of C3 on C4, reflecting underlying facet disease. Skull base and vertebrae: No acute fracture. No primary bone lesion or focal pathologic process. Soft tissues and spinal canal: No prevertebral fluid or swelling. No visible canal hematoma. Disc levels: Multilevel disc space narrowing is noted along the lower cervical spine, with scattered anterior and posterior disc osteophyte complexes, and underlying bony foraminal narrowing. Upper chest: Hypodensities within the right thyroid lobe measure up to 1.3 cm in size, likely benign given their size. Scattered calcification is noted at the carotid bifurcations bilaterally. Other: No additional soft tissue abnormalities are seen. IMPRESSION: 1. No evidence of traumatic intracranial injury or fracture. 2. No evidence of fracture or subluxation along the cervical spine. 3. Mild cortical volume loss and scattered small vessel ischemic microangiopathy. 4. Small chronic lacunar infarcts at the basal ganglia bilaterally, and at the right thalamus. 5. Mild degenerative change along the cervical spine. 6. Mild partial opacification  of the mastoid air cells bilaterally. 7. Scattered calcification at the carotid bifurcations bilaterally. Carotid ultrasound would be helpful for further evaluation,  when and as deemed clinically appropriate. Electronically Signed   By: Garald Balding M.D.   On: 07/19/2018 01:05   Ct Cervical Spine Wo Contrast  Result Date: 07/19/2018 CLINICAL DATA:  Slipped on ice cube. Fell backward and hit head and back. Concern for cervical spine injury. Patient was on Plavix until 3 days ago. Initial encounter. EXAM: CT HEAD WITHOUT CONTRAST CT CERVICAL SPINE WITHOUT CONTRAST TECHNIQUE: Multidetector CT imaging of the head and cervical spine was performed following the standard protocol without intravenous contrast. Multiplanar CT image reconstructions of the cervical spine were also generated. COMPARISON:  CT of the head and cervical spine performed 05/14/2018 FINDINGS: CT HEAD FINDINGS Brain: No evidence of acute infarction, hemorrhage, hydrocephalus, extra-axial collection or mass lesion / mass effect. Prominence of the ventricles and sulci reflects mild cortical volume loss. Mild cerebellar atrophy is noted. Scattered periventricular and subcortical white matter change likely reflects small vessel ischemic microangiopathy. Small chronic lacunar infarcts are noted at the basal ganglia bilaterally, and at the right thalamus. The brainstem and fourth ventricle are within normal limits. The cerebral hemispheres demonstrate grossly normal gray-white differentiation. No mass effect or midline shift is seen. Vascular: No hyperdense vessel or unexpected calcification. Skull: There is no evidence of fracture; visualized osseous structures are unremarkable in appearance. Sinuses/Orbits: The orbits are within normal limits. There is mild partial opacification of the mastoid air cells bilaterally. The paranasal sinuses are well-aerated. Other: No significant soft tissue abnormalities are seen. CT CERVICAL SPINE FINDINGS Alignment: There is grade 1 anterolisthesis of C2 on C3 and of C3 on C4, reflecting underlying facet disease. Skull base and vertebrae: No acute fracture. No primary bone  lesion or focal pathologic process. Soft tissues and spinal canal: No prevertebral fluid or swelling. No visible canal hematoma. Disc levels: Multilevel disc space narrowing is noted along the lower cervical spine, with scattered anterior and posterior disc osteophyte complexes, and underlying bony foraminal narrowing. Upper chest: Hypodensities within the right thyroid lobe measure up to 1.3 cm in size, likely benign given their size. Scattered calcification is noted at the carotid bifurcations bilaterally. Other: No additional soft tissue abnormalities are seen. IMPRESSION: 1. No evidence of traumatic intracranial injury or fracture. 2. No evidence of fracture or subluxation along the cervical spine. 3. Mild cortical volume loss and scattered small vessel ischemic microangiopathy. 4. Small chronic lacunar infarcts at the basal ganglia bilaterally, and at the right thalamus. 5. Mild degenerative change along the cervical spine. 6. Mild partial opacification of the mastoid air cells bilaterally. 7. Scattered calcification at the carotid bifurcations bilaterally. Carotid ultrasound would be helpful for further evaluation, when and as deemed clinically appropriate. Electronically Signed   By: Garald Balding M.D.   On: 07/19/2018 01:05   Dg Humerus Right  Result Date: 07/19/2018 CLINICAL DATA:  Fall EXAM: RIGHT HUMERUS - 2+ VIEW COMPARISON:  Right shoulder radiograph same [place] FINDINGS: Redemonstration of angulated fracture of the proximal right humerus. No distal humerus fracture. IMPRESSION: No distal humerus fracture. The angulated fracture of the proximal humerus is better demonstrated on the earlier shoulder radiograph. Electronically Signed   By: Ulyses Jarred M.D.   On: 07/19/2018 01:42    EKG: Independently reviewed.  Sinus rhythm, normal axis, positive PVC, normal intervals.  Assessment/Plan Active Problems:   Fall  77 year old male with ambulatory dysfunction who suffered a mechanical fall  while  ambulating with his walker, sustaining an injury to his right upper extremity with significant secondary pain.  Positive head trauma but no loss of consciousness.  On his initial physical examination his temperature is 98.2, blood pressure 187/87, heart rate 80, respiratory 16, ox saturation 98%, dry mucous membranes, lungs clear to auscultation, heart S1-S2 present rhythmic, abdomen soft nontender, no lower extremity edema, right upper extremity in a sling.  Tender to palpation at the right shoulder, no deformities.  Sodium 126, potassium 3.7, chloride 91, bicarb 24, glucose 88, BUN 17, creatinine 1.29, white count 9.6, hemoglobin 10.5, hematocrit 33.4, platelets 414, alcohol level less than 10, CT of his head and cervical spine, no acute changes.  Chest x-ray with small basilar left atelectasis.  Right shoulder x-ray with displaced fracture of the surgical neck of the right proximal humerus.   Patient will be admitted to the hospital with a working diagnosis of right proximal humeral fracture complicated by acute kidney injury and hyponatremia.  1.  Right proximal humeral fracture.  Continue pain control with IV analgesics, right upper extremity immobilization and DVT prophylaxis.  Follow-up with physical therapy and orthopedic surgery commendations. Scheduled acetaminophen.   2.  Acute kidney injury with hyponatremia.  Likely prerenal kidney injury, continue hydration with isotonic saline intravenously at 75 mill per hour, advance diet as tolerated, follow-up kidney function in the morning.  Avoid hypotension and nephrotoxic agents.  3.  Hypertension.  Continue metoprolol 50 mg daily and irbesartan for blood pressure control.  4.  COPD.  No acute exacerbation, continue as needed Albuterol, tiotropium and Dulera.   5.  Dyslipidemia.  Continue atorvastatin and clopidogrel.  6.  Depression with history of alcohol abuse..  Continue duloxetine, and escitalopram.  No current signs of intoxication or acute  withdrawal, continue neurochecks per unit protocol, thiamine and multivitamins.  Hold on benzodiazepines for now.   DVT prophylaxis:  Enoxaparin  Code Status:  full  Family Communication: No family at the bedside   Disposition Plan: Med-Surg   Consults called:  Orthopedics  Admission status: Observation.     Virginio Isidore Gerome Apley MD [facility] Pager [phone]  If 7PM-7AM, please contact night-coverage [email or web address] Password [email or web address]  07/19/2018, 7:58 AM

## 2018-07-19 NOTE — ED Notes (Signed)
Pt noted w/belongings bag on counter. Denture cup w/pt's lower dentures placed in bag per pt's request.

## 2018-07-20 DIAGNOSIS — S42211A Unspecified displaced fracture of surgical neck of right humerus, initial encounter for closed fracture: Secondary | ICD-10-CM | POA: Diagnosis not present

## 2018-07-20 DIAGNOSIS — W19XXXA Unspecified fall, initial encounter: Secondary | ICD-10-CM | POA: Diagnosis not present

## 2018-07-20 LAB — CBC
HEMATOCRIT: 30.4 % — AB (ref 39.0–52.0)
HEMOGLOBIN: 9.9 g/dL — AB (ref 13.0–17.0)
MCH: 32.4 pg (ref 26.0–34.0)
MCHC: 32.6 g/dL (ref 30.0–36.0)
MCV: 99.3 fL (ref 80.0–100.0)
NRBC: 0 % (ref 0.0–0.2)
Platelets: 336 10*3/uL (ref 150–400)
RBC: 3.06 MIL/uL — AB (ref 4.22–5.81)
RDW: 14 % (ref 11.5–15.5)
WBC: 6 10*3/uL (ref 4.0–10.5)

## 2018-07-20 LAB — BASIC METABOLIC PANEL
ANION GAP: 8 (ref 5–15)
Anion gap: 10 (ref 5–15)
BUN: 15 mg/dL (ref 8–23)
BUN: 16 mg/dL (ref 8–23)
CALCIUM: 8.1 mg/dL — AB (ref 8.9–10.3)
CHLORIDE: 101 mmol/L (ref 98–111)
CO2: 24 mmol/L (ref 22–32)
CO2: 21 mmol/L — AB (ref 22–32)
CREATININE: 1.19 mg/dL (ref 0.61–1.24)
Calcium: 8.2 mg/dL — ABNORMAL LOW (ref 8.9–10.3)
Chloride: 99 mmol/L (ref 98–111)
Creatinine, Ser: 1.14 mg/dL (ref 0.61–1.24)
GFR calc Af Amer: >60 mL/min (ref >60)
GFR calc Af Amer: >60 mL/min (ref >60)
GFR calc non Af Amer: >60 mL/min (ref >60)
GFR calc non Af Amer: 58 mL/min — ABNORMAL LOW (ref >60)
GLUCOSE: 141 mg/dL — AB (ref 70–99)
Glucose, Bld: 141 mg/dL — ABNORMAL HIGH (ref 70–99)
POTASSIUM: 3.2 mmol/L — AB (ref 3.5–5.1)
Potassium: 3.2 mmol/L — ABNORMAL LOW (ref 3.5–5.1)
SODIUM: 131 mmol/L — AB (ref 135–145)
Sodium: 132 mmol/L — ABNORMAL LOW (ref 135–145)

## 2018-07-20 LAB — TROPONIN I: Troponin I: <0.03 ng/mL (ref <0.03)

## 2018-07-20 MED ORDER — SENNOSIDES-DOCUSATE SODIUM 8.6-50 MG PO TABS: 2.0000 | ORAL_TABLET | Freq: Once | ORAL | 0 refills | Status: AC

## 2018-07-20 MED ORDER — NICOTINE 21 MG/24HR TD PT24: 21.0000 mg | MEDICATED_PATCH | Freq: Every day | TRANSDERMAL | 0 refills | Status: DC

## 2018-07-20 MED ORDER — THIAMINE HCL 100 MG PO TABS: 100.0000 mg | ORAL_TABLET | Freq: Every day | ORAL | 1 refills | Status: DC

## 2018-07-20 MED ORDER — OXYCODONE-ACETAMINOPHEN 5-325 MG PO TABS: 1.0000 | ORAL_TABLET | ORAL | 0 refills | Status: AC | PRN

## 2018-07-20 MED ORDER — POTASSIUM CHLORIDE IN NACL 40-0.9 MEQ/L-% IV SOLN
INTRAVENOUS | Status: DC
  Administered 2018-07-20 – 2018-07-21 (×3): 75 mL/h via INTRAVENOUS
  Filled 2018-07-20 (×2): qty 1000

## 2018-07-20 MED ORDER — POTASSIUM CHLORIDE CRYS ER 20 MEQ PO TBCR
40.0000 meq | EXTENDED_RELEASE_TABLET | Freq: Once | ORAL | Status: AC
  Administered 2018-07-20: 40 meq via ORAL
  Filled 2018-07-20: qty 2

## 2018-07-20 NOTE — Discharge Summary (Addendum)
Physician Discharge Summary  Brad Turner MRN: [id number] DOB/AGE: [birth year] 75 y.o.  PCP: Haywood Pao, MD   Admit date: 07/18/2018 Discharge date: 07/20/2018  Discharge Diagnoses:    Active Problems:   Fall alcohol dependence Right displaced humeral neck fracture. HLD (hyperlipidemia)   COPD (chronic obstructive pulmonary disease) with emphysema (HCC)   GERD   Takotsubo cardiomyopathy   Tobacco abuse   Depression   History of CVA (cerebrovascular accident)   ETOH abuse   Follow-up recommendations Follow-up with PCP in 3-5 days , including all  additional recommended appointments as below Follow-up CBC, CMP in 3-5 days   follow up in our office with Dr. [name]  with in 1 week to discuss ORIF vs conservative treatment    Allergies as of 07/20/2018      Reactions   Iodinated Diagnostic Agents Hives, Shortness Of Breath, Rash   Reaction was when pt was 77 years old   Macrodantin [nitrofurantoin] Other (See Comments)   Fever that lasted several months      Medication List    STOP taking these medications   cephALEXin 500 MG capsule Commonly known as:  KEFLEX     TAKE these medications   acetaminophen 650 MG CR tablet Commonly known as:  TYLENOL Take 650 mg by mouth every 8 (eight) hours as needed for pain.   albuterol 108 (90 Base) MCG/ACT inhaler Commonly known as:  PROVENTIL HFA;VENTOLIN HFA Inhale 2 puffs into the lungs every 6 (six) hours as needed for wheezing or shortness of breath. Reported on 11/29/2015   arformoterol 15 MCG/2ML Nebu Commonly known as:  BROVANA Take 2 mLs (15 mcg total) by nebulization 2 (two) times daily.   atorvastatin 80 MG tablet Commonly known as:  LIPITOR Take 80 mg by mouth daily.   clopidogrel 75 MG tablet Commonly known as:  PLAVIX Take 1 tablet (75 mg total) by mouth daily. Restart in 7-10days after follow up with ENT Dr,[place]   DULoxetine 60 MG capsule Commonly known as:  CYMBALTA Take 60 mg by mouth  daily.   ENSURE Take 237 mLs by mouth 2 (two) times daily between meals.   escitalopram 10 MG tablet Commonly known as:  LEXAPRO Take 10 mg by mouth daily.   folic acid 026 MCG tablet Commonly known as:  FOLVITE Take 400 mcg by mouth daily.   folic acid 1 MG tablet Commonly known as:  FOLVITE Take 1 tablet (1 mg total) by mouth daily.   metoprolol succinate 50 MG 24 hr tablet Commonly known as:  TOPROL-XL Take 1 tablet (50 mg total) by mouth daily. Take with or immediately following a meal.   MYRBETRIQ 50 MG Tb24 tablet Generic drug:  mirabegron ER Take 50 mg by mouth daily.   nicotine 21 mg/24hr patch Commonly known as:  NICODERM CQ - dosed in mg/24 hours Place 1 patch (21 mg total) onto the skin daily. Start taking on:  07/21/2018   NITROSTAT 0.4 MG SL tablet Generic drug:  nitroGLYCERIN Place 0.4 mg under the tongue every 5 (five) minutes as needed for chest pain. Reported on 11/29/2015   omeprazole 40 MG capsule Commonly known as:  PRILOSEC TAKE ONE CAPSULE BY MOUTH TWICE DAILY   oxyCODONE-acetaminophen 5-325 MG tablet Commonly known as:  PERCOCET/ROXICET Take 1 tablet by mouth every 4 (four) hours as needed for moderate pain.   senna-docusate 8.6-50 MG tablet Commonly known as:  Senokot-S Take 2 tablets by mouth once for 1 dose.   sodium  chloride 0.65 % Soln nasal spray Commonly known as:  OCEAN Place 1 spray into both nostrils as needed for congestion.   SPIRIVA RESPIMAT 2.5 MCG/ACT Aers Generic drug:  Tiotropium Bromide Monohydrate Inhale 2 puffs into the lungs daily.   SYMBICORT 160-4.5 MCG/ACT inhaler Generic drug:  budesonide-formoterol INHALE TWO PUFFS INTO LUNGS TWICE DAILY What changed:  See the new instructions.   thiamine 100 MG tablet Take 1 tablet (100 mg total) by mouth daily. Start taking on:  07/21/2018   valsartan 160 MG tablet Commonly known as:  DIOVAN Take 160 mg by mouth daily.          Discharge Condition: PROGNOSIS  GUARDED BECAUSE OF CONTINUED ALCOHOL ABUSE, ISCHEMIC CARDIOMYOPATHY, FALLS, NONCOMPLIANCE WITH MEDICAL RECOMMENDATIONS  Discharge Instructions Get Medicines reviewed and adjusted: Please take all your medications with you for your next visit with your Primary MD  Please request your Primary MD to go over all hospital tests and procedure/radiological results at the follow up, please ask your Primary MD to get all Hospital records sent to his/her office.  If you experience worsening of your admission symptoms, develop shortness of breath, life threatening emergency, suicidal or homicidal thoughts you must seek medical attention immediately by calling 911 or calling your MD immediately  if symptoms less severe.  You must read complete instructions/literature along with all the possible adverse reactions/side effects for all the Medicines you take and that have been prescribed to you. Take any new Medicines after you have completely understood and accpet all the possible adverse reactions/side effects.   Do not drive when taking Pain medications.   Do not take more than prescribed Pain, Sleep and Anxiety Medications  Special Instructions: If you have smoked or chewed Tobacco  in the last 2 yrs please stop smoking, stop any regular Alcohol  and or any Recreational drug use.  Wear Seat belts while driving.  Please note  You were cared for by a hospitalist during your hospital stay. Once you are discharged, your primary care physician will handle any further medical issues. Please note that NO REFILLS for any discharge medications will be authorized once you are discharged, as it is imperative that you return to your primary care physician (or establish a relationship with a primary care physician if you do not have one) for your aftercare needs so that they can reassess your need for medications and monitor your lab values.     Allergies  Allergen Reactions  . Iodinated Diagnostic Agents Hives,  Shortness Of Breath and Rash    Reaction was when pt was 77 years old   . Macrodantin [Nitrofurantoin] Other (See Comments)    Fever that lasted several months      Disposition: home with home health   Consults:  Orthopedics     Significant Diagnostic Studies:  Dg Chest 1 View  Result Date: 07/19/2018 CLINICAL DATA:  Status post fall. Back pain. EXAM: CHEST  1 VIEW COMPARISON:  04/26/2018, 03/11/2018 FINDINGS: There is stable blunting of the left costophrenic angle which may reflect a small pleural effusion versus scarring. There is stable left basilar airspace disease which may reflect atelectasis versus scarring. There is no right pleural effusion. There is no pneumothorax. The heart and mediastinal contours are unremarkable. The osseous structures are unremarkable. IMPRESSION: Stable blunting of the left costophrenic angle which may reflect a small pleural effusion versus scarring. Stable left basilar airspace disease which may reflect atelectasis versus scarring. Electronically Signed   By: Kathreen Devoid  On: 07/19/2018 01:36   Dg Thoracic Spine 2 View  Result Date: 07/19/2018 CLINICAL DATA:  Status post fall, back pain EXAM: THORACIC SPINE 2 VIEWS COMPARISON:  None. FINDINGS: There is no evidence of thoracic spine fracture. Generalized osteopenia. Alignment is normal. No other significant bone abnormalities are identified. Mild degenerative disc disease throughout the thoracic spine. IMPRESSION: No acute osseous injury of the thoracic spine. Electronically Signed   By: Kathreen Devoid   On: 07/19/2018 01:40   Dg Lumbar Spine 2-3 Views  Result Date: 07/19/2018 CLINICAL DATA:  Status post fall, back pain EXAM: LUMBAR SPINE - 2-3 VIEW COMPARISON:  CT lumbar spine 06/16/2018 FINDINGS: There is no evidence of lumbar spine fracture.  Alignment is normal. Mild disc space loss at L2-3. Posterior spinal fusion from L4 through S1. Bilateral L4 pedicle screws demonstrate lucency around the  screws consistent with loosening with the tip eroding through the superior endplate into the Q4-6 disc space. Thoracic aortic atherosclerosis. IMPRESSION: 1.  No acute osseous injury of the lumbar spine. Electronically Signed   By: Kathreen Devoid   On: 07/19/2018 01:44   Dg Shoulder Right  Result Date: 07/19/2018 CLINICAL DATA:  Status post fall, right shoulder pain EXAM: RIGHT SHOULDER - 2+ VIEW COMPARISON:  None. FINDINGS: Generalized osteopenia. Displaced fracture of the surgical neck of the right proximal humerus with 11 mm of lateral displacement. No glenohumeral dislocation. Mild arthropathy of the acromioclavicular joint and glenohumeral joint. Small bone island in the proximal humeral diaphysis. IMPRESSION: Displaced fracture of the surgical neck of the right proximal humerus. Electronically Signed   By: Kathreen Devoid   On: 07/19/2018 01:38   Dg Elbow Complete Right  Result Date: 07/19/2018 CLINICAL DATA:  Status post fall, right elbow pain EXAM: RIGHT ELBOW - COMPLETE 3+ VIEW COMPARISON:  None. FINDINGS: There is no evidence of fracture, dislocation, or joint effusion. There is no evidence of arthropathy or other focal bone abnormality. Soft tissues are unremarkable. IMPRESSION: Negative. Electronically Signed   By: Kathreen Devoid   On: 07/19/2018 01:39   Dg Forearm Right  Result Date: 07/19/2018 CLINICAL DATA:  Fall EXAM: RIGHT FOREARM - 2 VIEW COMPARISON:  None. FINDINGS: There is no evidence of fracture or other focal bone lesions. Soft tissues are unremarkable. IMPRESSION: Negative. Electronically Signed   By: Ulyses Jarred M.D.   On: 07/19/2018 01:42   Ct Head Wo Contrast  Result Date: 07/19/2018 CLINICAL DATA:  Slipped on ice cube. Fell backward and hit head and back. Concern for cervical spine injury. Patient was on Plavix until 3 days ago. Initial encounter. EXAM: CT HEAD WITHOUT CONTRAST CT CERVICAL SPINE WITHOUT CONTRAST TECHNIQUE: Multidetector CT imaging of the head and cervical  spine was performed following the standard protocol without intravenous contrast. Multiplanar CT image reconstructions of the cervical spine were also generated. COMPARISON:  CT of the head and cervical spine performed 05/14/2018 FINDINGS: CT HEAD FINDINGS Brain: No evidence of acute infarction, hemorrhage, hydrocephalus, extra-axial collection or mass lesion / mass effect. Prominence of the ventricles and sulci reflects mild cortical volume loss. Mild cerebellar atrophy is noted. Scattered periventricular and subcortical white matter change likely reflects small vessel ischemic microangiopathy. Small chronic lacunar infarcts are noted at the basal ganglia bilaterally, and at the right thalamus. The brainstem and fourth ventricle are within normal limits. The cerebral hemispheres demonstrate grossly normal gray-white differentiation. No mass effect or midline shift is seen. Vascular: No hyperdense vessel or unexpected calcification. Skull: There is no evidence  of fracture; visualized osseous structures are unremarkable in appearance. Sinuses/Orbits: The orbits are within normal limits. There is mild partial opacification of the mastoid air cells bilaterally. The paranasal sinuses are well-aerated. Other: No significant soft tissue abnormalities are seen. CT CERVICAL SPINE FINDINGS Alignment: There is grade 1 anterolisthesis of C2 on C3 and of C3 on C4, reflecting underlying facet disease. Skull base and vertebrae: No acute fracture. No primary bone lesion or focal pathologic process. Soft tissues and spinal canal: No prevertebral fluid or swelling. No visible canal hematoma. Disc levels: Multilevel disc space narrowing is noted along the lower cervical spine, with scattered anterior and posterior disc osteophyte complexes, and underlying bony foraminal narrowing. Upper chest: Hypodensities within the right thyroid lobe measure up to 1.3 cm in size, likely benign given their size. Scattered calcification is noted at  the carotid bifurcations bilaterally. Other: No additional soft tissue abnormalities are seen. IMPRESSION: 1. No evidence of traumatic intracranial injury or fracture. 2. No evidence of fracture or subluxation along the cervical spine. 3. Mild cortical volume loss and scattered small vessel ischemic microangiopathy. 4. Small chronic lacunar infarcts at the basal ganglia bilaterally, and at the right thalamus. 5. Mild degenerative change along the cervical spine. 6. Mild partial opacification of the mastoid air cells bilaterally. 7. Scattered calcification at the carotid bifurcations bilaterally. Carotid ultrasound would be helpful for further evaluation, when and as deemed clinically appropriate. Electronically Signed   By: Garald Balding M.D.   On: 07/19/2018 01:05   Ct Cervical Spine Wo Contrast  Result Date: 07/19/2018 CLINICAL DATA:  Slipped on ice cube. Fell backward and hit head and back. Concern for cervical spine injury. Patient was on Plavix until 3 days ago. Initial encounter. EXAM: CT HEAD WITHOUT CONTRAST CT CERVICAL SPINE WITHOUT CONTRAST TECHNIQUE: Multidetector CT imaging of the head and cervical spine was performed following the standard protocol without intravenous contrast. Multiplanar CT image reconstructions of the cervical spine were also generated. COMPARISON:  CT of the head and cervical spine performed 05/14/2018 FINDINGS: CT HEAD FINDINGS Brain: No evidence of acute infarction, hemorrhage, hydrocephalus, extra-axial collection or mass lesion / mass effect. Prominence of the ventricles and sulci reflects mild cortical volume loss. Mild cerebellar atrophy is noted. Scattered periventricular and subcortical white matter change likely reflects small vessel ischemic microangiopathy. Small chronic lacunar infarcts are noted at the basal ganglia bilaterally, and at the right thalamus. The brainstem and fourth ventricle are within normal limits. The cerebral hemispheres demonstrate grossly  normal gray-white differentiation. No mass effect or midline shift is seen. Vascular: No hyperdense vessel or unexpected calcification. Skull: There is no evidence of fracture; visualized osseous structures are unremarkable in appearance. Sinuses/Orbits: The orbits are within normal limits. There is mild partial opacification of the mastoid air cells bilaterally. The paranasal sinuses are well-aerated. Other: No significant soft tissue abnormalities are seen. CT CERVICAL SPINE FINDINGS Alignment: There is grade 1 anterolisthesis of C2 on C3 and of C3 on C4, reflecting underlying facet disease. Skull base and vertebrae: No acute fracture. No primary bone lesion or focal pathologic process. Soft tissues and spinal canal: No prevertebral fluid or swelling. No visible canal hematoma. Disc levels: Multilevel disc space narrowing is noted along the lower cervical spine, with scattered anterior and posterior disc osteophyte complexes, and underlying bony foraminal narrowing. Upper chest: Hypodensities within the right thyroid lobe measure up to 1.3 cm in size, likely benign given their size. Scattered calcification is noted at the carotid bifurcations bilaterally. Other: No  additional soft tissue abnormalities are seen. IMPRESSION: 1. No evidence of traumatic intracranial injury or fracture. 2. No evidence of fracture or subluxation along the cervical spine. 3. Mild cortical volume loss and scattered small vessel ischemic microangiopathy. 4. Small chronic lacunar infarcts at the basal ganglia bilaterally, and at the right thalamus. 5. Mild degenerative change along the cervical spine. 6. Mild partial opacification of the mastoid air cells bilaterally. 7. Scattered calcification at the carotid bifurcations bilaterally. Carotid ultrasound would be helpful for further evaluation, when and as deemed clinically appropriate. Electronically Signed   By: Garald Balding M.D.   On: 07/19/2018 01:05   Dg Humerus Right  Result  Date: 07/19/2018 CLINICAL DATA:  Fall EXAM: RIGHT HUMERUS - 2+ VIEW COMPARISON:  Right shoulder radiograph same [place] FINDINGS: Redemonstration of angulated fracture of the proximal right humerus. No distal humerus fracture. IMPRESSION: No distal humerus fracture. The angulated fracture of the proximal humerus is better demonstrated on the earlier shoulder radiograph. Electronically Signed   By: Ulyses Jarred M.D.   On: 07/19/2018 01:42          There were no vitals filed for this visit.   Microbiology: No results found for this or any previous visit (from the past 240 hour(s)).     Blood Culture    Component Value Date/Time   SDES BRONCHIAL WASHINGS 11/24/2017 1322   SDES PLEURAL FLUID 11/24/2017 1322   SPECREQUEST LEFT 11/24/2017 1322   SPECREQUEST LEFT EMPYEMA 11/24/2017 1322   CULT  11/24/2017 1322    NO GROWTH 2 DAYS Performed at [facility], [place] [address], [place], [place] [address]    CULT  11/24/2017 1322    NO GROWTH 3 DAYS Performed at [facility], [place] [address], [place], [place] [address]    REPTSTATUS 11/26/2017 FINAL 11/24/2017 1322   REPTSTATUS 11/27/2017 FINAL 11/24/2017 1322      Labs: Results for orders placed or performed during the hospital encounter of 07/18/18 (from the past 48 hour(s))  CBC with Differential/Platelet     Status: Abnormal   Collection Time: 07/19/18 12:28 AM  Result Value Ref Range   WBC 9.6 4.0 - 10.5 K/uL   RBC 3.28 (L) 4.22 - 5.81 MIL/uL   Hemoglobin 10.5 (L) 13.0 - 17.0 g/dL   HCT 33.4 (L) 39.0 - 52.0 %   MCV 101.8 (H) 80.0 - 100.0 fL   MCH 32.0 26.0 - 34.0 pg   MCHC 31.4 30.0 - 36.0 g/dL   RDW 13.7 11.5 - 15.5 %   Platelets 414 (H) 150 - 400 K/uL   nRBC 0.0 0.0 - 0.2 %   Neutrophils Relative % 70 %   Neutro Abs 6.7 1.7 - 7.7 K/uL   Lymphocytes Relative 21 %   Lymphs Abs 2.0 0.7 - 4.0 K/uL   Monocytes Relative 9 %   Monocytes Absolute 0.8 0.1 - 1.0 K/uL   Eosinophils Relative 0 %   Eosinophils Absolute  0.0 0.0 - 0.5 K/uL   Basophils Relative 0 %   Basophils Absolute 0.0 0.0 - 0.1 K/uL   Immature Granulocytes 0 %   Abs Immature Granulocytes 0.03 0.00 - 0.07 K/uL    Comment: Performed at [facility], [address] [place], [place] [address]  Comprehensive metabolic panel     Status: Abnormal   Collection Time: 07/19/18 12:28 AM  Result Value Ref Range   Sodium 126 (L) 135 - 145 mmol/L   Potassium 3.7 3.5 -  5.1 mmol/L   Chloride 91 (L) 98 - 111 mmol/L   CO2 24 22 - 32 mmol/L   Glucose, Bld 88 70 - 99 mg/dL   BUN 17 8 - 23 mg/dL   Creatinine, Ser 1.29 (H) 0.61 - 1.24 mg/dL   Calcium 8.3 (L) 8.9 - 10.3 mg/dL   Total Protein 7.1 6.5 - 8.1 g/dL   Albumin 3.1 (L) 3.5 - 5.0 g/dL   AST 38 15 - 41 U/L   ALT 18 0 - 44 U/L   Alkaline Phosphatase 155 (H) 38 - 126 U/L   Total Bilirubin 1.2 0.3 - 1.2 mg/dL   GFR calc non Af Amer 53 (L) >60 mL/min   GFR calc Af Amer >60 >60 mL/min    Comment: (NOTE) The eGFR has been calculated using the CKD EPI equation. This calculation has not been validated in all clinical situations. eGFR's persistently <60 mL/min signify possible Chronic Kidney Disease.    Anion gap 11 5 - 15    Comment: Performed at [facility] [address] [place], [place] [address]  Troponin I - ONCE - STAT     Status: None   Collection Time: 07/19/18 12:38 AM  Result Value Ref Range   Troponin I <0.03 <0.03 ng/mL    Comment: Performed at [facility] [address] [place], [place] [address]  Ethanol     Status: None   Collection Time: 07/19/18  2:01 AM  Result Value Ref Range   Alcohol, Ethyl (B) <10 <10 mg/dL    Comment: (NOTE) Lowest detectable limit for serum alcohol is 10 mg/dL. For medical purposes only. Performed at [facility], [place] [address], [place], [place] [address]   Protime-INR     Status: None   Collection Time: 07/19/18  2:38 AM  Result Value Ref Range   Prothrombin Time 13.3 11.4 - 15.2 seconds   INR 1.02     Comment:  Performed at [facility] [address], [place], [place] [address]  APTT     Status: None   Collection Time: 07/19/18  2:38 AM  Result Value Ref Range   aPTT 29 24 - 36 seconds    Comment: Performed at [facility] [address], [place], [place] [address]  Brain natriuretic peptide     Status: Abnormal   Collection Time: 07/19/18  2:38 AM  Result Value Ref Range   B Natriuretic Peptide 266.5 (H) 0.0 - 100.0 pg/mL    Comment: Performed at [facility] [address], [place], [place] [address]  Basic metabolic panel     Status: Abnormal   Collection Time: 07/20/18  5:19 AM  Result Value Ref Range   Sodium 131 (L) 135 - 145 mmol/L   Potassium 3.2 (L) 3.5 - 5.1 mmol/L   Chloride 99 98 - 111 mmol/L   CO2 24 22 - 32 mmol/L   Glucose, Bld 141 (H) 70 - 99 mg/dL   BUN 15 8 - 23 mg/dL   Creatinine, Ser 1.14 0.61 - 1.24 mg/dL   Calcium 8.1 (L) 8.9 - 10.3 mg/dL   GFR calc non Af Amer >60 >60 mL/min   GFR calc Af Amer >60 >60 mL/min    Comment: (NOTE) The eGFR has been calculated using the CKD EPI equation. This calculation has not been validated in all clinical situations. eGFR's persistently <60 mL/min signify possible Chronic Kidney Disease.    Anion gap 8  5 - 15    Comment: Performed at [facility], [place] [address] [place], [place] [address]  CBC     Status: Abnormal   Collection Time: 07/20/18  5:19 AM  Result Value Ref Range   WBC 6.0 4.0 - 10.5 K/uL   RBC 3.06 (L) 4.22 - 5.81 MIL/uL   Hemoglobin 9.9 (L) 13.0 - 17.0 g/dL   HCT 30.4 (L) 39.0 - 52.0 %   MCV 99.3 80.0 - 100.0 fL   MCH 32.4 26.0 - 34.0 pg   MCHC 32.6 30.0 - 36.0 g/dL   RDW 14.0 11.5 - 15.5 %   Platelets 336 150 - 400 K/uL   nRBC 0.0 0.0 - 0.2 %    Comment: Performed at [facility], [place] [address], [place], [place] [address]     Lipid Panel     Component Value Date/Time   CHOL 131 09/14/2017 2354   TRIG 76 11/24/2017 2145   HDL 84 09/14/2017 2354   CHOLHDL 1.6  09/14/2017 2354   VLDL 9 09/14/2017 2354   LDLCALC 38 09/14/2017 2354     No results found for: HGBA1C   Lab Results  Component Value Date   LDLCALC 38 09/14/2017   CREATININE 1.14 07/20/2018     HPI : 77 y.o. male with medical history significant of COPD with emphysema, GERD, history of stroke, arthritis, ambulatory dysfunction, and recent crush injury to his left ring finger.  Patient suffered a mechanical fall, he slipped while ambulating with his walker, he landed on his right side, positive head trauma but no loss of consciousness, post event he had significant pain on his right arm.  Noted to have right displaced humeral neck fracture this admission In August 2019, patient sustained a nasal fracture and maxillary sinus fracture and multiple skin tears from a fall   HOSPITAL COURSE:   77 year old male with ambulatory dysfunction who suffered a mechanical fall while ambulating with his walker, sustaining an injury to his right upper extremity with significant secondary pain.  Positive head trauma but no loss of consciousness.  On his initial physical examination his temperature is 98.2, blood pressure 187/87, heart rate 80, respiratory 16, ox saturation 98%, dry mucous membranes, lungs clear to auscultation, heart S1-S2 present rhythmic, abdomen soft nontender, no lower extremity edema, right upper extremity in a sling.  Tender to palpation at the right shoulder, no deformities.  Sodium 126, potassium 3.7, chloride 91, bicarb 24, glucose 88, BUN 17, creatinine 1.29, white count 9.6, hemoglobin 10.5, hematocrit 33.4, platelets 414, alcohol level less than 10, CT of his head and cervical spine, no acute changes.  Chest x-ray with small basilar left atelectasis.  Right shoulder x-ray with displaced fracture of the surgical neck of the right proximal humerus.   admitted to the hospital with a working diagnosis of right proximal humeral fracture complicated by acute kidney injury and  hyponatremia.  1.  Right proximal humeral fracture.  patient treated with  IV analgesics /Norco, right upper extremity immobilization .  Follow-up with physical therapy and orthopedic surgery commendations. Patient evaluated by   [name] PA, /Dr.Rowan,follow up in our office with Dr. [name]  with in 1 week to discuss ORIF vs conservative treatment . He may need clearance from cardiology because of his ischemic cardiomyopathy. He is to be non weight bearing with regards to the right shoulder.  There is some concern that given the 2 recent falls and lack of help at home that  he may go back to to drinking and fall again.  2.  Acute kidney injury with hyponatremia.  Likely prerenal kidney injury,  Improved with hydration with isotonic saline intravenously at 75 mill per hour,  Tolerated regular diet, acute kidney injury  Resolved . 3.  Hypertension.  Continue metoprolol 50 mg daily and irbesartan for blood pressure control.  4.  COPD.  No acute exacerbation, continue as needed Albuterol, tiotropium and Dulera.   5.  Dyslipidemia.  Continue atorvastatin and clopidogrel.  6.  Depression with history of alcohol abuse..  Continue duloxetine, and escitalopram.  No current signs of intoxication or acute withdrawal, continue neurochecks per unit protocol, thiamine and multivitamins.  Hold on benzodiazepines for now.    Discharge Exam:   Blood pressure (!) 173/94, pulse 74, temperature 98.2 F (36.8 C), resp. rate 19, SpO2 97 %.  Cardiovascular: No JVD. S1-S2 present, rhythmic, no gallops, rubs, or murmurs. No lower extremity edema. Pulmonary: positive breath sounds bilaterally, adequate air movement, no wheezing, rhonchi or rales. Gastrointestinal. Abdomen with, no organomegaly, non tender, no rebound or guarding Skin. No rashes Musculoskeletal: right upper extremity placed on sling, tender to palpation on right shoulder and limited mobility due to pain, no significant deformity.       Follow-up Information    Malena Catholic, MD. Schedule an appointment as soon as possible for a visit in 1 week(s).   Contact information: [address] [place] [id number] [phone]           Signed: Reyne Dumas 07/20/2018, 8:44 AM      Time needed to  prepare  discharge, discussed with the patient and family 35 minutes

## 2018-07-20 NOTE — Progress Notes (Signed)
Allyson Sabal, MD notified that PT/OT recommended SNF and that patient was becoming more confused. Patient stated, "Come and cut these chords off of me and get me out of this basement." MD stated that she would discontinue the D/C order. Lorriane Shire, SW contacted regarding need for SNF placement. Will continue to monitor.
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
## 2018-07-20 NOTE — Care Management Note (Addendum)
Case Management Note  Patient Details  Name: Amitai Delaughter Delong MRN: [id number] Date of Birth: 08/12/1941  Subjective/Objective:  Pt is a 77 yo male admitted after a mechanical fall in his home stating he slipped on a piece of ice from his freezer and fell back striking his head and already fractured R elbow. Pt in sling prior to fall from fall 1 week ago where he fractured his R arm.  Pt with R prox humerous fx.  Pt states he had been drinking at time of both falls.  Pt with B rotator cuff surgery, COPD, CVA, recent crush infury to L ring finger and back surgery.   PTA, pt independent with assistive devices; lives alone.                   Action/Plan: PT/OT recommending SNF at discharge.  Pt very confused, and no family present currently.  Notified CSW of need for SNF.    Expected Discharge Date:  07/20/18               Expected Discharge Plan:  Skilled Nursing Facility  In-House Referral:  Clinical Social Work  Discharge planning Services  CM Consult  Post Acute Care Choice:    Choice offered to:     DME Arranged:    DME Agency:     HH Arranged:    [place] Agency:     Status of Service:  In process, will continue to follow  If discussed at Long Length of Stay Meetings, dates discussed:    Additional Comments:  Ella Bodo, RN 07/20/2018, 5:20 PM
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
## 2018-07-21 ENCOUNTER — Inpatient Hospital Stay: Admission: RE | Admit: 2018-07-21 | Payer: Medicare Other | Source: Ambulatory Visit

## 2018-07-21 ENCOUNTER — Inpatient Hospital Stay
Admission: RE | Admit: 2018-07-21 | Discharge: 2018-07-21 | Disposition: A | Discharge status: Home / Self Care | Payer: Medicare Other | Source: Ambulatory Visit | Attending: Orthopedic Surgery | Admitting: Orthopedic Surgery

## 2018-07-21 DIAGNOSIS — W19XXXA Unspecified fall, initial encounter: Secondary | ICD-10-CM | POA: Diagnosis not present

## 2018-07-21 DIAGNOSIS — S42211A Unspecified displaced fracture of surgical neck of right humerus, initial encounter for closed fracture: Secondary | ICD-10-CM | POA: Diagnosis not present

## 2018-07-21 LAB — BASIC METABOLIC PANEL
ANION GAP: 5 (ref 5–15)
Anion gap: 9 (ref 5–15)
BUN: 9 mg/dL (ref 8–23)
BUN: 8 mg/dL (ref 8–23)
CALCIUM: 7.8 mg/dL — AB (ref 8.9–10.3)
CO2: 20 mmol/L — ABNORMAL LOW (ref 22–32)
CO2: 22 mmol/L (ref 22–32)
CREATININE: 0.8 mg/dL (ref 0.61–1.24)
Calcium: 8.7 mg/dL — ABNORMAL LOW (ref 8.9–10.3)
Chloride: 109 mmol/L (ref 98–111)
Chloride: 98 mmol/L (ref 98–111)
Creatinine, Ser: 0.85 mg/dL (ref 0.61–1.24)
GFR calc Af Amer: >60 mL/min (ref >60)
GFR calc non Af Amer: >60 mL/min (ref >60)
GFR calc non Af Amer: >60 mL/min (ref >60)
GLUCOSE: 92 mg/dL (ref 70–99)
GLUCOSE: 149 mg/dL — AB (ref 70–99)
POTASSIUM: 3.8 mmol/L (ref 3.5–5.1)
Potassium: 7.4 mmol/L (ref 3.5–5.1)
Sodium: 134 mmol/L — ABNORMAL LOW (ref 135–145)
Sodium: 129 mmol/L — ABNORMAL LOW (ref 135–145)

## 2018-07-21 LAB — MAGNESIUM: Magnesium: 1 mg/dL — ABNORMAL LOW (ref 1.7–2.4)

## 2018-07-21 MED ORDER — CALCIUM GLUCONATE 10 % IV SOLN: 1.0000 g | Freq: Once | INTRAVENOUS | Status: DC

## 2018-07-21 MED ORDER — MAGNESIUM SULFATE 2 GM/50ML IV SOLN: 2.0000 g | Freq: Once | INTRAVENOUS | Status: DC

## 2018-07-21 MED ORDER — CALCIUM GLUCONATE-NACL 1-0.675 GM/50ML-% IV SOLN
1.0000 g | Freq: Once | INTRAVENOUS | Status: AC
  Administered 2018-07-21: 1000 mg via INTRAVENOUS
  Filled 2018-07-21: qty 50

## 2018-07-21 MED ORDER — LORAZEPAM 1 MG PO TABS: 1.0000 mg | ORAL_TABLET | Freq: Four times a day (QID) | ORAL | Status: DC | PRN

## 2018-07-21 MED ORDER — THIAMINE HCL 100 MG/ML IJ SOLN: 100.0000 mg | Freq: Every day | INTRAMUSCULAR | Status: DC

## 2018-07-21 MED ORDER — ADULT MULTIVITAMIN W/MINERALS CH: 1.0000 | ORAL_TABLET | Freq: Every day | ORAL | Status: DC

## 2018-07-21 MED ORDER — POTASSIUM CHLORIDE CRYS ER 20 MEQ PO TBCR: 40.0000 meq | EXTENDED_RELEASE_TABLET | Freq: Two times a day (BID) | ORAL | Status: DC

## 2018-07-21 MED ORDER — LORAZEPAM 2 MG/ML IJ SOLN: 0.0000 mg | Freq: Two times a day (BID) | INTRAMUSCULAR | Status: DC

## 2018-07-21 MED ORDER — POTASSIUM CHLORIDE CRYS ER 20 MEQ PO TBCR: 40.0000 meq | EXTENDED_RELEASE_TABLET | Freq: Every day | ORAL | 0 refills | Status: AC

## 2018-07-21 MED ORDER — MAGNESIUM OXIDE 400 MG PO CAPS: ORAL_CAPSULE | ORAL | 0 refills | Status: DC

## 2018-07-21 MED ORDER — FOLIC ACID 1 MG PO TABS: 1.0000 mg | ORAL_TABLET | Freq: Every day | ORAL | Status: DC

## 2018-07-21 MED ORDER — SODIUM ZIRCONIUM CYCLOSILICATE 5 G PO PACK
5.0000 g | PACK | Freq: Two times a day (BID) | ORAL | Status: DC
  Administered 2018-07-21: 5 g via ORAL
  Filled 2018-07-21 (×2): qty 1

## 2018-07-21 MED ORDER — LORAZEPAM 2 MG/ML IJ SOLN
0.0000 mg | Freq: Four times a day (QID) | INTRAMUSCULAR | Status: DC
  Administered 2018-07-21: 2 mg via INTRAVENOUS
  Administered 2018-07-22: 1 mg via INTRAVENOUS
  Filled 2018-07-21: qty 1

## 2018-07-21 MED ORDER — MAGNESIUM SULFATE 50 % IJ SOLN
3.0000 g | Freq: Once | INTRAVENOUS | Status: AC
  Administered 2018-07-21: 3 g via INTRAVENOUS
  Filled 2018-07-21: qty 6

## 2018-07-21 MED ORDER — LORAZEPAM 2 MG/ML IJ SOLN
1.0000 mg | Freq: Four times a day (QID) | INTRAMUSCULAR | Status: DC | PRN
  Filled 2018-07-21: qty 1

## 2018-07-21 MED ORDER — VITAMIN B-1 100 MG PO TABS: 100.0000 mg | ORAL_TABLET | Freq: Every day | ORAL | Status: DC

## 2018-07-21 NOTE — Discharge Summary (Signed)
Physician Discharge Summary  Brad Turner MRN: [id number] DOB/AGE: Mar 16, 1941 77 y.o.  PCP: Haywood Pao, MD   Admit date: 07/18/2018 Discharge date: 07/21/2018  Discharge Diagnoses:    Active Problems:   Fall alcohol dependence Right displaced humeral neck fracture. HLD (hyperlipidemia)   COPD (chronic obstructive pulmonary disease) with emphysema (HCC)   GERD   Takotsubo cardiomyopathy   Tobacco abuse   Depression   History of CVA (cerebrovascular accident)   ETOH abuse  Addendum Discharge held on 11/18 because of need for SNF placement   Follow-up recommendations Follow-up with PCP in 3-5 days , including all  additional recommended appointments as below Follow-up CBC, CMP in 3-5 days   follow up in our office with Dr. [name]  with in 1 week to discuss ORIF vs conservative treatment    Allergies as of 07/20/2018      Reactions   Iodinated Diagnostic Agents Hives, Shortness Of Breath, Rash   Reaction was when pt was 77 years old   Macrodantin [nitrofurantoin] Other (See Comments)   Fever that lasted several months      Allergies as of 07/21/2018      Reactions   Iodinated Diagnostic Agents Hives, Shortness Of Breath, Rash   Reaction was when pt was 77 years old   Macrodantin [nitrofurantoin] Other (See Comments)   Fever that lasted several months      Medication List    STOP taking these medications   cephALEXin 500 MG capsule Commonly known as:  KEFLEX     TAKE these medications   acetaminophen 650 MG CR tablet Commonly known as:  TYLENOL Take 650 mg by mouth every 8 (eight) hours as needed for pain.   albuterol 108 (90 Base) MCG/ACT inhaler Commonly known as:  PROVENTIL HFA;VENTOLIN HFA Inhale 2 puffs into the lungs every 6 (six) hours as needed for wheezing or shortness of breath. Reported on 11/29/2015   arformoterol 15 MCG/2ML Nebu Commonly known as:  BROVANA Take 2 mLs (15 mcg total) by nebulization 2 (two) times daily.    atorvastatin 80 MG tablet Commonly known as:  LIPITOR Take 80 mg by mouth daily.   clopidogrel 75 MG tablet Commonly known as:  PLAVIX Take 1 tablet (75 mg total) by mouth daily. Restart in 7-10days after follow up with ENT Dr,[place]   DULoxetine 60 MG capsule Commonly known as:  CYMBALTA Take 60 mg by mouth daily.   ENSURE Take 237 mLs by mouth 2 (two) times daily between meals.   escitalopram 10 MG tablet Commonly known as:  LEXAPRO Take 10 mg by mouth daily.   folic acid 619 MCG tablet Commonly known as:  FOLVITE Take 400 mcg by mouth daily.   folic acid 1 MG tablet Commonly known as:  FOLVITE Take 1 tablet (1 mg total) by mouth daily.   Magnesium Oxide 400 MG Caps 1 tablet twice a [place]   metoprolol succinate 50 MG 24 hr tablet Commonly known as:  TOPROL-XL Take 1 tablet (50 mg total) by mouth daily. Take with or immediately following a meal.   MYRBETRIQ 50 MG Tb24 tablet Generic drug:  mirabegron ER Take 50 mg by mouth daily.   nicotine 21 mg/24hr patch Commonly known as:  NICODERM CQ - dosed in mg/24 hours Place 1 patch (21 mg total) onto the skin daily.   NITROSTAT 0.4 MG SL tablet Generic drug:  nitroGLYCERIN Place 0.4 mg under the tongue every 5 (five) minutes as needed for chest pain. Reported  on 11/29/2015   omeprazole 40 MG capsule Commonly known as:  PRILOSEC TAKE ONE CAPSULE BY MOUTH TWICE DAILY   oxyCODONE-acetaminophen 5-325 MG tablet Commonly known as:  PERCOCET/ROXICET Take 1 tablet by mouth every 4 (four) hours as needed for moderate pain.   potassium chloride SA 20 MEQ tablet Commonly known as:  K-DUR,KLOR-CON Take 2 tablets (40 mEq total) by mouth daily.   potassium chloride SA 20 MEQ tablet Commonly known as:  K-DUR,KLOR-CON Take 2 tablets (40 mEq total) by mouth daily.   sodium chloride 0.65 % Soln nasal spray Commonly known as:  OCEAN Place 1 spray into both nostrils as needed for congestion.   SPIRIVA RESPIMAT 2.5 MCG/ACT  Aers Generic drug:  Tiotropium Bromide Monohydrate Inhale 2 puffs into the lungs daily.   SYMBICORT 160-4.5 MCG/ACT inhaler Generic drug:  budesonide-formoterol INHALE TWO PUFFS INTO LUNGS TWICE DAILY What changed:  See the new instructions.   thiamine 100 MG tablet Take 1 tablet (100 mg total) by mouth daily.   valsartan 160 MG tablet Commonly known as:  DIOVAN Take 160 mg by mouth daily.     ASK your doctor about these medications   senna-docusate 8.6-50 MG tablet Commonly known as:  Senokot-S Take 2 tablets by mouth once for 1 dose. Ask about: Should I take this medication?        Discharge Condition: PROGNOSIS GUARDED BECAUSE OF CONTINUED ALCOHOL ABUSE, ISCHEMIC CARDIOMYOPATHY, FALLS, NONCOMPLIANCE WITH MEDICAL RECOMMENDATIONS  Discharge Instructions Get Medicines reviewed and adjusted: Please take all your medications with you for your next visit with your Primary MD  Please request your Primary MD to go over all hospital tests and procedure/radiological results at the follow up, please ask your Primary MD to get all Hospital records sent to his/her office.  If you experience worsening of your admission symptoms, develop shortness of breath, life threatening emergency, suicidal or homicidal thoughts you must seek medical attention immediately by calling 911 or calling your MD immediately  if symptoms less severe.  You must read complete instructions/literature along with all the possible adverse reactions/side effects for all the Medicines you take and that have been prescribed to you. Take any new Medicines after you have completely understood and accpet all the possible adverse reactions/side effects.   Do not drive when taking Pain medications.   Do not take more than prescribed Pain, Sleep and Anxiety Medications  Special Instructions: If you have smoked or chewed Tobacco  in the last 2 yrs please stop smoking, stop any regular Alcohol  and or any Recreational drug  use.  Wear Seat belts while driving.  Please note  You were cared for by a hospitalist during your hospital stay. Once you are discharged, your primary care physician will handle any further medical issues. Please note that NO REFILLS for any discharge medications will be authorized once you are discharged, as it is imperative that you return to your primary care physician (or establish a relationship with a primary care physician if you do not have one) for your aftercare needs so that they can reassess your need for medications and monitor your lab values.     Allergies  Allergen Reactions  . Iodinated Diagnostic Agents Hives, Shortness Of Breath and Rash    Reaction was when pt was 77 years old   . Macrodantin [Nitrofurantoin] Other (See Comments)    Fever that lasted several months      Disposition: home with home health   Consults:  Orthopedics  Significant Diagnostic Studies:  Dg Chest 1 View  Result Date: 07/19/2018 CLINICAL DATA:  Status post fall. Back pain. EXAM: CHEST  1 VIEW COMPARISON:  04/26/2018, 03/11/2018 FINDINGS: There is stable blunting of the left costophrenic angle which may reflect a small pleural effusion versus scarring. There is stable left basilar airspace disease which may reflect atelectasis versus scarring. There is no right pleural effusion. There is no pneumothorax. The heart and mediastinal contours are unremarkable. The osseous structures are unremarkable. IMPRESSION: Stable blunting of the left costophrenic angle which may reflect a small pleural effusion versus scarring. Stable left basilar airspace disease which may reflect atelectasis versus scarring. Electronically Signed   By: Kathreen Devoid   On: 07/19/2018 01:36   Dg Thoracic Spine 2 View  Result Date: 07/19/2018 CLINICAL DATA:  Status post fall, back pain EXAM: THORACIC SPINE 2 VIEWS COMPARISON:  None. FINDINGS: There is no evidence of thoracic spine fracture. Generalized osteopenia.  Alignment is normal. No other significant bone abnormalities are identified. Mild degenerative disc disease throughout the thoracic spine. IMPRESSION: No acute osseous injury of the thoracic spine. Electronically Signed   By: Kathreen Devoid   On: 07/19/2018 01:40   Dg Lumbar Spine 2-3 Views  Result Date: 07/19/2018 CLINICAL DATA:  Status post fall, back pain EXAM: LUMBAR SPINE - 2-3 VIEW COMPARISON:  CT lumbar spine 06/16/2018 FINDINGS: There is no evidence of lumbar spine fracture.  Alignment is normal. Mild disc space loss at L2-3. Posterior spinal fusion from L4 through S1. Bilateral L4 pedicle screws demonstrate lucency around the screws consistent with loosening with the tip eroding through the superior endplate into the H8-2 disc space. Thoracic aortic atherosclerosis. IMPRESSION: 1.  No acute osseous injury of the lumbar spine. Electronically Signed   By: Kathreen Devoid   On: 07/19/2018 01:44   Dg Shoulder Right  Result Date: 07/19/2018 CLINICAL DATA:  Status post fall, right shoulder pain EXAM: RIGHT SHOULDER - 2+ VIEW COMPARISON:  None. FINDINGS: Generalized osteopenia. Displaced fracture of the surgical neck of the right proximal humerus with 11 mm of lateral displacement. No glenohumeral dislocation. Mild arthropathy of the acromioclavicular joint and glenohumeral joint. Small bone island in the proximal humeral diaphysis. IMPRESSION: Displaced fracture of the surgical neck of the right proximal humerus. Electronically Signed   By: Kathreen Devoid   On: 07/19/2018 01:38   Dg Elbow Complete Right  Result Date: 07/19/2018 CLINICAL DATA:  Status post fall, right elbow pain EXAM: RIGHT ELBOW - COMPLETE 3+ VIEW COMPARISON:  None. FINDINGS: There is no evidence of fracture, dislocation, or joint effusion. There is no evidence of arthropathy or other focal bone abnormality. Soft tissues are unremarkable. IMPRESSION: Negative. Electronically Signed   By: Kathreen Devoid   On: 07/19/2018 01:39   Dg Forearm  Right  Result Date: 07/19/2018 CLINICAL DATA:  Fall EXAM: RIGHT FOREARM - 2 VIEW COMPARISON:  None. FINDINGS: There is no evidence of fracture or other focal bone lesions. Soft tissues are unremarkable. IMPRESSION: Negative. Electronically Signed   By: Ulyses Jarred M.D.   On: 07/19/2018 01:42   Ct Head Wo Contrast  Result Date: 07/19/2018 CLINICAL DATA:  Slipped on ice cube. Fell backward and hit head and back. Concern for cervical spine injury. Patient was on Plavix until 3 days ago. Initial encounter. EXAM: CT HEAD WITHOUT CONTRAST CT CERVICAL SPINE WITHOUT CONTRAST TECHNIQUE: Multidetector CT imaging of the head and cervical spine was performed following the standard protocol without intravenous contrast. Multiplanar CT image  reconstructions of the cervical spine were also generated. COMPARISON:  CT of the head and cervical spine performed 05/14/2018 FINDINGS: CT HEAD FINDINGS Brain: No evidence of acute infarction, hemorrhage, hydrocephalus, extra-axial collection or mass lesion / mass effect. Prominence of the ventricles and sulci reflects mild cortical volume loss. Mild cerebellar atrophy is noted. Scattered periventricular and subcortical white matter change likely reflects small vessel ischemic microangiopathy. Small chronic lacunar infarcts are noted at the basal ganglia bilaterally, and at the right thalamus. The brainstem and fourth ventricle are within normal limits. The cerebral hemispheres demonstrate grossly normal gray-white differentiation. No mass effect or midline shift is seen. Vascular: No hyperdense vessel or unexpected calcification. Skull: There is no evidence of fracture; visualized osseous structures are unremarkable in appearance. Sinuses/Orbits: The orbits are within normal limits. There is mild partial opacification of the mastoid air cells bilaterally. The paranasal sinuses are well-aerated. Other: No significant soft tissue abnormalities are seen. CT CERVICAL SPINE FINDINGS  Alignment: There is grade 1 anterolisthesis of C2 on C3 and of C3 on C4, reflecting underlying facet disease. Skull base and vertebrae: No acute fracture. No primary bone lesion or focal pathologic process. Soft tissues and spinal canal: No prevertebral fluid or swelling. No visible canal hematoma. Disc levels: Multilevel disc space narrowing is noted along the lower cervical spine, with scattered anterior and posterior disc osteophyte complexes, and underlying bony foraminal narrowing. Upper chest: Hypodensities within the right thyroid lobe measure up to 1.3 cm in size, likely benign given their size. Scattered calcification is noted at the carotid bifurcations bilaterally. Other: No additional soft tissue abnormalities are seen. IMPRESSION: 1. No evidence of traumatic intracranial injury or fracture. 2. No evidence of fracture or subluxation along the cervical spine. 3. Mild cortical volume loss and scattered small vessel ischemic microangiopathy. 4. Small chronic lacunar infarcts at the basal ganglia bilaterally, and at the right thalamus. 5. Mild degenerative change along the cervical spine. 6. Mild partial opacification of the mastoid air cells bilaterally. 7. Scattered calcification at the carotid bifurcations bilaterally. Carotid ultrasound would be helpful for further evaluation, when and as deemed clinically appropriate. Electronically Signed   By: Garald Balding M.D.   On: 07/19/2018 01:05   Ct Cervical Spine Wo Contrast  Result Date: 07/19/2018 CLINICAL DATA:  Slipped on ice cube. Fell backward and hit head and back. Concern for cervical spine injury. Patient was on Plavix until 3 days ago. Initial encounter. EXAM: CT HEAD WITHOUT CONTRAST CT CERVICAL SPINE WITHOUT CONTRAST TECHNIQUE: Multidetector CT imaging of the head and cervical spine was performed following the standard protocol without intravenous contrast. Multiplanar CT image reconstructions of the cervical spine were also generated.  COMPARISON:  CT of the head and cervical spine performed 05/14/2018 FINDINGS: CT HEAD FINDINGS Brain: No evidence of acute infarction, hemorrhage, hydrocephalus, extra-axial collection or mass lesion / mass effect. Prominence of the ventricles and sulci reflects mild cortical volume loss. Mild cerebellar atrophy is noted. Scattered periventricular and subcortical white matter change likely reflects small vessel ischemic microangiopathy. Small chronic lacunar infarcts are noted at the basal ganglia bilaterally, and at the right thalamus. The brainstem and fourth ventricle are within normal limits. The cerebral hemispheres demonstrate grossly normal gray-white differentiation. No mass effect or midline shift is seen. Vascular: No hyperdense vessel or unexpected calcification. Skull: There is no evidence of fracture; visualized osseous structures are unremarkable in appearance. Sinuses/Orbits: The orbits are within normal limits. There is mild partial opacification of the mastoid air cells bilaterally. The  paranasal sinuses are well-aerated. Other: No significant soft tissue abnormalities are seen. CT CERVICAL SPINE FINDINGS Alignment: There is grade 1 anterolisthesis of C2 on C3 and of C3 on C4, reflecting underlying facet disease. Skull base and vertebrae: No acute fracture. No primary bone lesion or focal pathologic process. Soft tissues and spinal canal: No prevertebral fluid or swelling. No visible canal hematoma. Disc levels: Multilevel disc space narrowing is noted along the lower cervical spine, with scattered anterior and posterior disc osteophyte complexes, and underlying bony foraminal narrowing. Upper chest: Hypodensities within the right thyroid lobe measure up to 1.3 cm in size, likely benign given their size. Scattered calcification is noted at the carotid bifurcations bilaterally. Other: No additional soft tissue abnormalities are seen. IMPRESSION: 1. No evidence of traumatic intracranial injury or  fracture. 2. No evidence of fracture or subluxation along the cervical spine. 3. Mild cortical volume loss and scattered small vessel ischemic microangiopathy. 4. Small chronic lacunar infarcts at the basal ganglia bilaterally, and at the right thalamus. 5. Mild degenerative change along the cervical spine. 6. Mild partial opacification of the mastoid air cells bilaterally. 7. Scattered calcification at the carotid bifurcations bilaterally. Carotid ultrasound would be helpful for further evaluation, when and as deemed clinically appropriate. Electronically Signed   By: Garald Balding M.D.   On: 07/19/2018 01:05   Dg Humerus Right  Result Date: 07/19/2018 CLINICAL DATA:  Fall EXAM: RIGHT HUMERUS - 2+ VIEW COMPARISON:  Right shoulder radiograph same [place] FINDINGS: Redemonstration of angulated fracture of the proximal right humerus. No distal humerus fracture. IMPRESSION: No distal humerus fracture. The angulated fracture of the proximal humerus is better demonstrated on the earlier shoulder radiograph. Electronically Signed   By: Ulyses Jarred M.D.   On: 07/19/2018 01:42          There were no vitals filed for this visit.   Microbiology: No results found for this or any previous visit (from the past 240 hour(s)).     Blood Culture    Component Value Date/Time   SDES BRONCHIAL WASHINGS 11/24/2017 1322   SDES PLEURAL FLUID 11/24/2017 1322   SPECREQUEST LEFT 11/24/2017 1322   SPECREQUEST LEFT EMPYEMA 11/24/2017 1322   CULT  11/24/2017 1322    NO GROWTH 2 DAYS Performed at [facility], [place] [address], [place], [place] [address]    CULT  11/24/2017 1322    NO GROWTH 3 DAYS Performed at [facility], [place] [address], [place], [place] [address]    REPTSTATUS 11/26/2017 FINAL 11/24/2017 1322   REPTSTATUS 11/27/2017 FINAL 11/24/2017 1322      Labs: Results for orders placed or performed during the hospital encounter of 07/18/18 (from the past 48 hour(s))  Basic metabolic  panel     Status: Abnormal   Collection Time: 07/20/18  5:19 AM  Result Value Ref Range   Sodium 131 (L) 135 - 145 mmol/L   Potassium 3.2 (L) 3.5 - 5.1 mmol/L   Chloride 99 98 - 111 mmol/L   CO2 24 22 - 32 mmol/L   Glucose, Bld 141 (H) 70 - 99 mg/dL   BUN 15 8 - 23 mg/dL   Creatinine, Ser 1.14 0.61 - 1.24 mg/dL   Calcium 8.1 (L) 8.9 - 10.3 mg/dL   GFR calc non Af Amer >60 >60 mL/min   GFR calc Af Amer >60 >60 mL/min    Comment: (NOTE) The eGFR has been calculated using the CKD EPI equation. This calculation has not been  validated in all clinical situations. eGFR's persistently <60 mL/min signify possible Chronic Kidney Disease.    Anion gap 8 5 - 15    Comment: Performed at [facility] [address], [place], [place] [address]  CBC     Status: Abnormal   Collection Time: 07/20/18  5:19 AM  Result Value Ref Range   WBC 6.0 4.0 - 10.5 K/uL   RBC 3.06 (L) 4.22 - 5.81 MIL/uL   Hemoglobin 9.9 (L) 13.0 - 17.0 g/dL   HCT 30.4 (L) 39.0 - 52.0 %   MCV 99.3 80.0 - 100.0 fL   MCH 32.4 26.0 - 34.0 pg   MCHC 32.6 30.0 - 36.0 g/dL   RDW 14.0 11.5 - 15.5 %   Platelets 336 150 - 400 K/uL   nRBC 0.0 0.0 - 0.2 %    Comment: Performed at [facility], [place] [address], [place], [place] [address]  Troponin I - ONCE - STAT     Status: None   Collection Time: 07/20/18  9:25 AM  Result Value Ref Range   Troponin I <0.03 <0.03 ng/mL    Comment: Performed at [facility] [address] [place], [place] [address]  Basic metabolic panel     Status: Abnormal   Collection Time: 07/20/18  9:25 AM  Result Value Ref Range   Sodium 132 (L) 135 - 145 mmol/L   Potassium 3.2 (L) 3.5 - 5.1 mmol/L   Chloride 101 98 - 111 mmol/L   CO2 21 (L) 22 - 32 mmol/L   Glucose, Bld 141 (H) 70 - 99 mg/dL   BUN 16 8 - 23 mg/dL   Creatinine, Ser 1.19 0.61 - 1.24 mg/dL   Calcium 8.2 (L) 8.9 - 10.3 mg/dL   GFR calc non Af Amer 58 (L) >60 mL/min   GFR calc Af Amer >60 >60 mL/min    Comment:  (NOTE) The eGFR has been calculated using the CKD EPI equation. This calculation has not been validated in all clinical situations. eGFR's persistently <60 mL/min signify possible Chronic Kidney Disease.    Anion gap 10 5 - 15    Comment: Performed at [facility] [address], [place], [place] [address]     Lipid Panel     Component Value Date/Time   CHOL 131 09/14/2017 2354   TRIG 76 11/24/2017 2145   HDL 84 09/14/2017 2354   CHOLHDL 1.6 09/14/2017 2354   VLDL 9 09/14/2017 2354   LDLCALC 38 09/14/2017 2354     No results found for: HGBA1C   Lab Results  Component Value Date   LDLCALC 38 09/14/2017   CREATININE 1.19 07/20/2018     HPI : 77 y.o. male with medical history significant of COPD with emphysema, GERD, history of stroke, arthritis, ambulatory dysfunction, and recent crush injury to his left ring finger.  Patient suffered a mechanical fall, he slipped while ambulating with his walker, he landed on his right side, positive head trauma but no loss of consciousness, post event he had significant pain on his right arm.  Noted to have right displaced humeral neck fracture this admission In August 2019, patient sustained a nasal fracture and maxillary sinus fracture and multiple skin tears from a fall   HOSPITAL COURSE:   77 year old male with ambulatory dysfunction who suffered a mechanical fall while ambulating with his walker, sustaining an injury to his right upper extremity with significant secondary pain.  Positive head trauma but  no loss of consciousness.  On his initial physical examination his temperature is 98.2, blood pressure 187/87, heart rate 80, respiratory 16, ox saturation 98%, dry mucous membranes, lungs clear to auscultation, heart S1-S2 present rhythmic, abdomen soft nontender, no lower extremity edema, right upper extremity in a sling.  Tender to palpation at the right shoulder, no deformities.  Sodium 126, potassium 3.7, chloride 91, bicarb 24,  glucose 88, BUN 17, creatinine 1.29, white count 9.6, hemoglobin 10.5, hematocrit 33.4, platelets 414, alcohol level less than 10, CT of his head and cervical spine, no acute changes.  Chest x-ray with small basilar left atelectasis.  Right shoulder x-ray with displaced fracture of the surgical neck of the right proximal humerus.   admitted to the hospital with a working diagnosis of right proximal humeral fracture complicated by acute kidney injury and hyponatremia.  1.  Right proximal humeral fracture.  patient treated with  IV analgesics /Norco, right upper extremity immobilization .  Follow-up with physical therapy and orthopedic surgery commendations. Patient evaluated by   [name] PA, /Dr.Rowan,follow up in our office with Dr. [name]  with in 1 week to discuss ORIF vs conservative treatment . He may need clearance from cardiology because of his ischemic cardiomyopathy. He is to be non weight bearing with regards to the right shoulder.  There is some concern that given the 2 recent falls and lack of help at home that he may go back to to drinking and fall again.  2.  Acute kidney injury with hyponatremia.  Likely prerenal kidney injury,  Improved with hydration with isotonic saline intravenously at 75 mill per hour,  Tolerated regular diet, acute kidney injury  Resolved. Noted to have hypokalemia this admission which was treated with calcium gluconate and  LOKELMA  , BMP repeated to ensure correction . 3.  Hypertension.  Continue metoprolol 50 mg daily and irbesartan for blood pressure control.  4.  COPD.  No acute exacerbation, continue as needed Albuterol, tiotropium and Dulera.   5.  Dyslipidemia.  Continue atorvastatin and clopidogrel.  6.  Depression with history of alcohol abuse..  Continue duloxetine, and escitalopram.  No current signs of intoxication or acute withdrawal, continue neurochecks per unit protocol, thiamine and multivitamins.  Hold on benzodiazepines for now.     Discharge Exam:   Blood pressure (!) 182/107, pulse 72, temperature 98.1 F (36.7 C), temperature source Oral, resp. rate 18, SpO2 100 %.  Cardiovascular: No JVD. S1-S2 present, rhythmic, no gallops, rubs, or murmurs. No lower extremity edema. Pulmonary: positive breath sounds bilaterally, adequate air movement, no wheezing, rhonchi or rales. Gastrointestinal. Abdomen with, no organomegaly, non tender, no rebound or guarding Skin. No rashes Musculoskeletal: right upper extremity placed on sling, tender to palpation on right shoulder and limited mobility due to pain, no significant deformity.      Follow-up Information    Malena Catholic, MD. Schedule an appointment as soon as possible for a visit in 1 week(s).   Contact information: [place] [id number] [phone]        [name], [name] Him, MD. Call.   Specialty:  Internal Medicine Why:  Hospital follow-up in 3-5 days Contact information: [place] [address] [phone]        [name], MD .   Specialty:  Cardiology Contact information: [phone] [address] [place] [address] [phone]           Signed: Reyne Dumas 07/21/2018, 9:01 AM  Time needed to  prepare  discharge, discussed with the patient and family 35 minutes

## 2018-07-21 NOTE — Clinical Social Work Note (Signed)
Assessment completed with patient and son and the discharge plan is home. MD contacted and updated. CSW will continue to follow through discharge.  Alani Lacivita Givens, MSW, LCSW Licensed Clinical Social Worker [place] [phone]

## 2018-07-21 NOTE — Discharge Instructions (Signed)
Post Procedure Spinal Discharge Instruction Sheet  1. You may resume a regular diet and any medications that you routinely take (including pain medications).  2. No driving [place] of procedure.  3. Light activity throughout the rest of the [place].  Do not do any strenuous work, exercise, bending or lifting.  The [place] following the procedure, you can resume normal physical activity but you should refrain from exercising or physical therapy for at least three days thereafter.   Common Side Effects:   Headaches- take your usual medications as directed by your physician.  Increase your fluid intake.  Caffeinated beverages may be helpful.  Lie flat in bed until your headache resolves.   Restlessness or inability to sleep- you may have trouble sleeping for the next few days.  Ask your referring physician if you need any medication for sleep.   Facial flushing or redness- should subside within a few days.   Increased pain- a temporary increase in pain a [place] or two following your procedure is not unusual.  Take your pain medication as prescribed by your referring physician.   Leg cramps  Please contact our office at [phone] for the following symptoms:  Fever greater than 100 degrees.  Headaches unresolved with medication after 2-3 days.  Increased swelling, pain, or redness at injection site.  YOU MAY RESTART YOUR PLAVIX TODAY. 

## 2018-07-21 NOTE — Progress Notes (Addendum)
[facility] PROGRESS NOTE  Brad Turner PPJ:[id number] DOB: Aug 21, 1941 DOA: 07/18/2018   PCP: Haywood Pao, MD     Assessment/Plan: Active Problems:   Fall       HPI : 77 y.o. male with medical history significant of COPD with emphysema, GERD, history of stroke, arthritis, ambulatory dysfunction, and recent crush injury to his left ring finger.  Patient suffered a mechanical fall, he slipped while ambulating with his walker, he landed on his right side, positive head trauma but no loss of consciousness, post event he had significant pain on his right arm.  Noted to have right displaced humeral neck fracture this admission In August 2019, patient sustained a nasal fracture and maxillary sinus fracture and multiple skin tears from a fall   HOSPITAL COURSE:   77 year old male with ambulatory dysfunction who suffered a mechanical fall while ambulating with his walker, sustaining an injury to his right upper extremity with significant secondary pain.  Positive head trauma but no loss of consciousness.  On his initial physical examination his temperature is 98.2, blood pressure 187/87, heart rate 80, respiratory 16, ox saturation 98%, dry mucous membranes, lungs clear to auscultation, heart S1-S2 present rhythmic, abdomen soft nontender, no lower extremity edema, right upper extremity in a sling.  Tender to palpation at the right shoulder, no deformities.  Sodium 126, potassium 3.7, chloride 91, bicarb 24, glucose 88, BUN 17, creatinine 1.29, white count 9.6, hemoglobin 10.5, hematocrit 33.4, platelets 414, alcohol level less than 10, CT of his head and cervical spine, no acute changes.  Chest x-ray with small basilar left atelectasis.  Right shoulder x-ray with displaced fracture of the surgical neck of the right proximal humerus.   admitted to the hospital with a working diagnosis of right proximal humeral fracture complicated by acute kidney injury and hyponatremia.  1.   Right proximal humeral fracture.  patient treated with  IV analgesics /Norco, right upper extremity immobilization .  Follow-up with physical therapy and orthopedic surgery commendations. Patient evaluated by   [name] PA, /Dr.Rowan,follow up in our office with Dr. [name]  with in 1 week to discuss ORIF vs conservative treatment . He may need clearance from cardiology because of his ischemic cardiomyopathy. He is to be non weight bearing with regards to the right shoulder.  There is some concern that given the 2 recent falls and lack of help at home that he may go back to to drinking and fall again. Now pending decision about going to SNF .  2.  Acute kidney injury with hyponatremia.  Likely prerenal kidney injury,  Improved with hydration with isotonic saline intravenously at 75 mill per hour,  Tolerated regular diet, acute kidney injury  Resolved. Noted to have hypokalemia  And then hyperkalemia this admission which was treated with calcium gluconate and  LOKELMA  , BMP repeated to ensure correction.ARB discontinued. He was also found to have low  magnesium of 1.1 which has been repleated . 3.  Hypertension.  Continue metoprolol 50 mg daily and holding  irbesartan for blood pressure control due to hyperkalemia.  4.  COPD.  No acute exacerbation, continue as needed Albuterol, tiotropium and Dulera.   5.  Dyslipidemia.  Continue atorvastatin and clopidogrel.  6.  Depression with history of alcohol abuse..  Continue duloxetine, and escitalopram.  No current signs of intoxication or acute withdrawal, continue neurochecks per unit protocol, thiamine and multivitamins.  Hold on benzodiazepines for now.  DVT prophylaxsis Lovenox  Code Status:  Full code    Family Communication: Discussed in detail with the patient, all imaging results, lab results explained to the patient   Disposition Plan:  Pending decision to transfer to SNF, son is aware and will make a decision today, SW will not  be able to find him a bed if he does decide to go to SNF    Consultants:  orthopedics  Procedures:  none  Antibiotics: Anti-infectives (From admission, onward)   None         HPI/Subjective: Patient confused, discussed care with the patient's son who said he will make a decision regarding SNF today  Objective: Vitals:   07/20/18 1645 07/21/18 0419 07/21/18 0747 07/21/18 0839  BP: (!) 180/96 (!) 167/103 (!) 182/107   Pulse: 73 75 72   Resp: 18 19 18    Temp: 98.3 F (36.8 C) 98 F (36.7 C) 98.1 F (36.7 C)   TempSrc: Oral Oral Oral   SpO2: 96%  100% 100%    Intake/Output Summary (Last 24 hours) at 07/21/2018 1313 Last data filed at 07/21/2018 0419 Gross per 24 hour  Intake 2044.56 ml  Output 100 ml  Net 1944.56 ml    Exam:  Examination:  General exam:confused Respiratory system: Clear to auscultation. Respiratory effort normal. Cardiovascular system: S1 & S2 heard, RRR. No JVD, murmurs, rubs, gallops or clicks. No pedal edema. Gastrointestinal system: Abdomen is nondistended, soft and nontender. No organomegaly or masses felt. Normal bowel sounds heard. Central nervous system: Alert and oriented to self . No focal neurological deficits. Extremities: Symmetric 5 x 5 power. Skin: No rashes, lesions or ulcers Psychiatry:  Impaired Judgement     Data Reviewed: I have personally reviewed following labs and imaging studies  Micro Results No results found for this or any previous visit (from the past 240 hour(s)).  Radiology Reports Dg Chest 1 View  Result Date: 07/19/2018 CLINICAL DATA:  Status post fall. Back pain. EXAM: CHEST  1 VIEW COMPARISON:  04/26/2018, 03/11/2018 FINDINGS: There is stable blunting of the left costophrenic angle which may reflect a small pleural effusion versus scarring. There is stable left basilar airspace disease which may reflect atelectasis versus scarring. There is no right pleural effusion. There is no pneumothorax. The  heart and mediastinal contours are unremarkable. The osseous structures are unremarkable. IMPRESSION: Stable blunting of the left costophrenic angle which may reflect a small pleural effusion versus scarring. Stable left basilar airspace disease which may reflect atelectasis versus scarring. Electronically Signed   By: Kathreen Devoid   On: 07/19/2018 01:36   Dg Thoracic Spine 2 View  Result Date: 07/19/2018 CLINICAL DATA:  Status post fall, back pain EXAM: THORACIC SPINE 2 VIEWS COMPARISON:  None. FINDINGS: There is no evidence of thoracic spine fracture. Generalized osteopenia. Alignment is normal. No other significant bone abnormalities are identified. Mild degenerative disc disease throughout the thoracic spine. IMPRESSION: No acute osseous injury of the thoracic spine. Electronically Signed   By: Kathreen Devoid   On: 07/19/2018 01:40   Dg Lumbar Spine 2-3 Views  Result Date: 07/19/2018 CLINICAL DATA:  Status post fall, back pain EXAM: LUMBAR SPINE - 2-3 VIEW COMPARISON:  CT lumbar spine 06/16/2018 FINDINGS: There is no evidence of lumbar spine fracture.  Alignment is normal. Mild disc space loss at L2-3. Posterior spinal fusion from L4 through S1. Bilateral L4 pedicle screws demonstrate lucency around the screws consistent with loosening with the tip eroding through the superior endplate into  the L3-4 disc space. Thoracic aortic atherosclerosis. IMPRESSION: 1.  No acute osseous injury of the lumbar spine. Electronically Signed   By: Kathreen Devoid   On: 07/19/2018 01:44   Dg Shoulder Right  Result Date: 07/19/2018 CLINICAL DATA:  Status post fall, right shoulder pain EXAM: RIGHT SHOULDER - 2+ VIEW COMPARISON:  None. FINDINGS: Generalized osteopenia. Displaced fracture of the surgical neck of the right proximal humerus with 11 mm of lateral displacement. No glenohumeral dislocation. Mild arthropathy of the acromioclavicular joint and glenohumeral joint. Small bone island in the proximal humeral  diaphysis. IMPRESSION: Displaced fracture of the surgical neck of the right proximal humerus. Electronically Signed   By: Kathreen Devoid   On: 07/19/2018 01:38   Dg Elbow Complete Right  Result Date: 07/19/2018 CLINICAL DATA:  Status post fall, right elbow pain EXAM: RIGHT ELBOW - COMPLETE 3+ VIEW COMPARISON:  None. FINDINGS: There is no evidence of fracture, dislocation, or joint effusion. There is no evidence of arthropathy or other focal bone abnormality. Soft tissues are unremarkable. IMPRESSION: Negative. Electronically Signed   By: Kathreen Devoid   On: 07/19/2018 01:39   Dg Forearm Right  Result Date: 07/19/2018 CLINICAL DATA:  Fall EXAM: RIGHT FOREARM - 2 VIEW COMPARISON:  None. FINDINGS: There is no evidence of fracture or other focal bone lesions. Soft tissues are unremarkable. IMPRESSION: Negative. Electronically Signed   By: Ulyses Jarred M.D.   On: 07/19/2018 01:42   Ct Head Wo Contrast  Result Date: 07/19/2018 CLINICAL DATA:  Slipped on ice cube. Fell backward and hit head and back. Concern for cervical spine injury. Patient was on Plavix until 3 days ago. Initial encounter. EXAM: CT HEAD WITHOUT CONTRAST CT CERVICAL SPINE WITHOUT CONTRAST TECHNIQUE: Multidetector CT imaging of the head and cervical spine was performed following the standard protocol without intravenous contrast. Multiplanar CT image reconstructions of the cervical spine were also generated. COMPARISON:  CT of the head and cervical spine performed 05/14/2018 FINDINGS: CT HEAD FINDINGS Brain: No evidence of acute infarction, hemorrhage, hydrocephalus, extra-axial collection or mass lesion / mass effect. Prominence of the ventricles and sulci reflects mild cortical volume loss. Mild cerebellar atrophy is noted. Scattered periventricular and subcortical white matter change likely reflects small vessel ischemic microangiopathy. Small chronic lacunar infarcts are noted at the basal ganglia bilaterally, and at the right thalamus.  The brainstem and fourth ventricle are within normal limits. The cerebral hemispheres demonstrate grossly normal gray-white differentiation. No mass effect or midline shift is seen. Vascular: No hyperdense vessel or unexpected calcification. Skull: There is no evidence of fracture; visualized osseous structures are unremarkable in appearance. Sinuses/Orbits: The orbits are within normal limits. There is mild partial opacification of the mastoid air cells bilaterally. The paranasal sinuses are well-aerated. Other: No significant soft tissue abnormalities are seen. CT CERVICAL SPINE FINDINGS Alignment: There is grade 1 anterolisthesis of C2 on C3 and of C3 on C4, reflecting underlying facet disease. Skull base and vertebrae: No acute fracture. No primary bone lesion or focal pathologic process. Soft tissues and spinal canal: No prevertebral fluid or swelling. No visible canal hematoma. Disc levels: Multilevel disc space narrowing is noted along the lower cervical spine, with scattered anterior and posterior disc osteophyte complexes, and underlying bony foraminal narrowing. Upper chest: Hypodensities within the right thyroid lobe measure up to 1.3 cm in size, likely benign given their size. Scattered calcification is noted at the carotid bifurcations bilaterally. Other: No additional soft tissue abnormalities are seen. IMPRESSION: 1. No evidence  of traumatic intracranial injury or fracture. 2. No evidence of fracture or subluxation along the cervical spine. 3. Mild cortical volume loss and scattered small vessel ischemic microangiopathy. 4. Small chronic lacunar infarcts at the basal ganglia bilaterally, and at the right thalamus. 5. Mild degenerative change along the cervical spine. 6. Mild partial opacification of the mastoid air cells bilaterally. 7. Scattered calcification at the carotid bifurcations bilaterally. Carotid ultrasound would be helpful for further evaluation, when and as deemed clinically appropriate.  Electronically Signed   By: Garald Balding M.D.   On: 07/19/2018 01:05   Ct Cervical Spine Wo Contrast  Result Date: 07/19/2018 CLINICAL DATA:  Slipped on ice cube. Fell backward and hit head and back. Concern for cervical spine injury. Patient was on Plavix until 3 days ago. Initial encounter. EXAM: CT HEAD WITHOUT CONTRAST CT CERVICAL SPINE WITHOUT CONTRAST TECHNIQUE: Multidetector CT imaging of the head and cervical spine was performed following the standard protocol without intravenous contrast. Multiplanar CT image reconstructions of the cervical spine were also generated. COMPARISON:  CT of the head and cervical spine performed 05/14/2018 FINDINGS: CT HEAD FINDINGS Brain: No evidence of acute infarction, hemorrhage, hydrocephalus, extra-axial collection or mass lesion / mass effect. Prominence of the ventricles and sulci reflects mild cortical volume loss. Mild cerebellar atrophy is noted. Scattered periventricular and subcortical white matter change likely reflects small vessel ischemic microangiopathy. Small chronic lacunar infarcts are noted at the basal ganglia bilaterally, and at the right thalamus. The brainstem and fourth ventricle are within normal limits. The cerebral hemispheres demonstrate grossly normal gray-white differentiation. No mass effect or midline shift is seen. Vascular: No hyperdense vessel or unexpected calcification. Skull: There is no evidence of fracture; visualized osseous structures are unremarkable in appearance. Sinuses/Orbits: The orbits are within normal limits. There is mild partial opacification of the mastoid air cells bilaterally. The paranasal sinuses are well-aerated. Other: No significant soft tissue abnormalities are seen. CT CERVICAL SPINE FINDINGS Alignment: There is grade 1 anterolisthesis of C2 on C3 and of C3 on C4, reflecting underlying facet disease. Skull base and vertebrae: No acute fracture. No primary bone lesion or focal pathologic process. Soft tissues  and spinal canal: No prevertebral fluid or swelling. No visible canal hematoma. Disc levels: Multilevel disc space narrowing is noted along the lower cervical spine, with scattered anterior and posterior disc osteophyte complexes, and underlying bony foraminal narrowing. Upper chest: Hypodensities within the right thyroid lobe measure up to 1.3 cm in size, likely benign given their size. Scattered calcification is noted at the carotid bifurcations bilaterally. Other: No additional soft tissue abnormalities are seen. IMPRESSION: 1. No evidence of traumatic intracranial injury or fracture. 2. No evidence of fracture or subluxation along the cervical spine. 3. Mild cortical volume loss and scattered small vessel ischemic microangiopathy. 4. Small chronic lacunar infarcts at the basal ganglia bilaterally, and at the right thalamus. 5. Mild degenerative change along the cervical spine. 6. Mild partial opacification of the mastoid air cells bilaterally. 7. Scattered calcification at the carotid bifurcations bilaterally. Carotid ultrasound would be helpful for further evaluation, when and as deemed clinically appropriate. Electronically Signed   By: Garald Balding M.D.   On: 07/19/2018 01:05   Dg Humerus Right  Result Date: 07/19/2018 CLINICAL DATA:  Fall EXAM: RIGHT HUMERUS - 2+ VIEW COMPARISON:  Right shoulder radiograph same [place] FINDINGS: Redemonstration of angulated fracture of the proximal right humerus. No distal humerus fracture. IMPRESSION: No distal humerus fracture. The angulated fracture of the proximal humerus  is better demonstrated on the earlier shoulder radiograph. Electronically Signed   By: Ulyses Jarred M.D.   On: 07/19/2018 01:42     CBC Recent Labs  Lab 07/19/18 0028 07/20/18 0519  WBC 9.6 6.0  HGB 10.5* 9.9*  HCT 33.4* 30.4*  PLT 414* 336  MCV 101.8* 99.3  MCH 32.0 32.4  MCHC 31.4 32.6  RDW 13.7 14.0  LYMPHSABS 2.0  --   MONOABS 0.8  --   EOSABS 0.0  --   BASOSABS 0.0  --      Chemistries  Recent Labs  Lab 07/19/18 0028 07/20/18 0519 07/20/18 0925 07/21/18 0951  NA 126* 131* 132* 134*  K 3.7 3.2* 3.2* 7.4*  CL 91* 99 101 109  CO2 24 24 21* 20*  GLUCOSE 88 141* 141* 92  BUN 17 15 16 9   CREATININE 1.29* 1.14 1.19 0.80  CALCIUM 8.3* 8.1* 8.2* 7.8*  MG  --   --   --  1.0*  AST 38  --   --   --   ALT 18  --   --   --   ALKPHOS 155*  --   --   --   BILITOT 1.2  --   --   --    ------------------------------------------------------------------------------------------------------------------ CrCl cannot be calculated (Unknown ideal weight.). ------------------------------------------------------------------------------------------------------------------ No results for input(s): HGBA1C in the last 72 hours. ------------------------------------------------------------------------------------------------------------------ No results for input(s): CHOL, HDL, LDLCALC, TRIG, CHOLHDL, LDLDIRECT in the last 72 hours. ------------------------------------------------------------------------------------------------------------------ No results for input(s): TSH, T4TOTAL, T3FREE, THYROIDAB in the last 72 hours.  Invalid input(s): FREET3 ------------------------------------------------------------------------------------------------------------------ No results for input(s): VITAMINB12, FOLATE, FERRITIN, TIBC, IRON, RETICCTPCT in the last 72 hours.  Coagulation profile Recent Labs  Lab 07/19/18 0238  INR 1.02    No results for input(s): DDIMER in the last 72 hours.  Cardiac Enzymes Recent Labs  Lab 07/19/18 0038 07/20/18 0925  TROPONINI <0.03 <0.03   ------------------------------------------------------------------------------------------------------------------ Invalid input(s): POCBNP   CBG: No results for input(s): GLUCAP in the last 168 hours.     Studies: No results found.    No results found for: HGBA1C Lab Results  Component  Value Date   LDLCALC 38 09/14/2017   CREATININE 0.80 07/21/2018       Scheduled Meds: . acetaminophen  650 mg Oral Q6H  . atorvastatin  80 mg Oral Daily  . DULoxetine  60 mg Oral Daily  . enoxaparin (LOVENOX) injection  40 mg Subcutaneous Q24H  . escitalopram  10 mg Oral Daily  . folic acid  1 mg Oral Daily  . metoprolol succinate  50 mg Oral Daily  . mirabegron ER  50 mg Oral Daily  . mometasone-formoterol  2 puff Inhalation BID  . multivitamin with minerals  1 tablet Oral Daily  . nicotine  21 mg Transdermal Daily  . pantoprazole  40 mg Oral Daily  . senna-docusate  2 tablet Oral Once  . sodium zirconium cyclosilicate  5 g Oral BID  . thiamine  100 mg Oral Daily   Or  . thiamine  100 mg Intravenous Daily  . umeclidinium bromide  1 puff Inhalation Daily   Continuous Infusions: . magnesium sulfate 1 - 4 g bolus IVPB       LOS: 0 days    Time spent: >30 MINS    Reyne Dumas  [facility] Pager [phone]. If 7PM-7AM, please contact night-coverage at [email or web address], password [facility] 07/21/2018, 1:13 PM  LOS: 0 days

## 2018-07-21 NOTE — NC FL2 (Signed)
[place] LEVEL OF CARE SCREENING TOOL     IDENTIFICATION  Patient Name: Brad Turner Birthdate: [birth year] Sex: male Admission Date (Current Location): 07/18/2018  [facility] and [place] Number:  Herbalist and Address:  [facility] [place], [place] [address], [place], [place] [address]      Provider Number: [id number]  Attending Physician Name and Address:  Reyne Dumas, MD  Relative Name and Phone Number:  [name] Kachmar - son - [phone]    Current Level of Care: Hospital Recommended Level of Care: [place] Prior Approval Number:    Date Approved/Denied:   PASRR Number: [id number]  Discharge Plan: SNF    Current Diagnoses: Patient Active Problem List   Diagnosis Date Noted  . Nail deformity 05/08/2018  . Fall 04/26/2018  . Nasal bone fracture 04/26/2018  . Maxillary sinus fracture ([place]) 04/26/2018  . Multiple skin tears   . Alcoholic intoxication without complication ([place])   . Liver mass 01/01/2018  . Adjustment disorder with depressed mood 12/29/2017  . Neurocognitive disorder 12/21/2017  . Adult failure to thrive 12/09/2017  . Pressure injury of skin 12/01/2017  . Empyema ([place]) 11/24/2017  . Iron deficiency anemia 11/22/2017  . Acute urinary retention 11/22/2017  . Hypokalemia   . Hypomagnesemia   . Parapneumonic effusion   . Sepsis due to pneumonia ([place])   . ETOH abuse 11/21/2017  . Acute respiratory failure ([place]) 11/21/2017  . HCAP (healthcare-associated pneumonia) 11/21/2017  . Sepsis ([place]) 11/21/2017  . Pleural effusion 11/21/2017  . Acute kidney injury ([place]) 11/21/2017  . Abdominal pain 11/21/2017  . Malnutrition of moderate degree 09/24/2017  . Acute delirium 09/16/2017  . Acute hyponatremia 09/16/2017  . Alcohol withdrawal ([place]) 09/16/2017  . 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 Tobacco abuse 09/16/2017  . Depression 09/16/2017  . History of CVA  (cerebrovascular accident) 09/16/2017  . History of Barrett's esophagus 09/16/2017  . Takotsubo cardiomyopathy 09/15/2017  . Chronic back pain 12/01/2016  . S/P lumbar spinal fusion 12/01/2016  . Dysthymia 12/01/2016  . Radiculopathy 10/17/2016  . Abnormal CT of the chest 06/10/2016  . Dysphagia   . Essential hypertension, benign 10/30/2014    Class: Chronic  . Syncope 10/29/2014  . Cough syncope 10/07/2014  . COPD with acute exacerbation ([place]) 09/29/2014  . Pulmonary nodules 11/22/2013  . Personal history of colonic polyps 06/04/2010  . COPD (chronic obstructive pulmonary disease) with emphysema ([place]) 02/26/2010  . CHEST PAIN, ATYPICAL 02/26/2010  . BARRETTS ESOPHAGUS 05/18/2009  . HLD (hyperlipidemia) 04/28/2008  . Coronary atherosclerosis 04/28/2008  . CVA 04/28/2008  . GERD 04/28/2008  . ARTHRITIS 04/28/2008  . NEPHROLITHIASIS, HX OF 04/28/2008    Orientation RESPIRATION BLADDER Height & Weight     Self, Time, Situation, Place  Normal Continent Weight:   Height:     BEHAVIORAL SYMPTOMS/MOOD NEUROLOGICAL BOWEL NUTRITION STATUS      Continent Diet(Regular, thin liquid)  AMBULATORY STATUS COMMUNICATION OF NEEDS Skin   Limited Assist Verbally Other (Comment)(Stage 1 pressure injury to right buttocks with foam dressing; Ecchymosis right hip and bilateral arms; Skin tear to right arm with foam dressing; Bruised upper body and right hip)                       Personal Care Assistance Level of Assistance  Bathing, Feeding, Dressing Bathing Assistance: Maximum assistance(Min assist upper body and mod assist lower body) Feeding assistance: Limited assistance(Assistance  with set-up) Dressing Assistance: Maximum assistance(Mod assist per OT)     Functional Limitations Info  Sight, Hearing, Speech Sight Info: Adequate Hearing Info: Adequate Speech Info: Adequate    SPECIAL CARE FACTORS FREQUENCY  PT (By licensed PT), OT (By licensed OT)     PT Frequency: PT at SNF  Eval and Treat; evaluated 11/18 during acute inpatient stay OT Frequency: OT at SNF Eval and Treat; evaluated 11/18 during acute inpatient stay            Contractures Contractures Info: Not present    Additional Factors Info  Code Status, Allergies Code Status Info: Full Allergies Info: Iodinated diagnostic agents, Macrodantin (nitrofurantoin)           Current Medications (07/21/2018):  This is the current hospital active medication list Current Facility-Administered Medications  Medication Dose Route Frequency Provider Last Rate Last Dose  . acetaminophen (TYLENOL) tablet 650 mg  650 mg Oral Q6H PRN Arrien, Jimmy Picket, MD       Or  . acetaminophen (TYLENOL) suppository 650 mg  650 mg Rectal Q6H PRN Arrien, Jimmy Picket, MD      . acetaminophen (TYLENOL) tablet 650 mg  650 mg Oral Q6H Arrien, Jimmy Picket, MD   650 mg at 07/21/18 1312  . albuterol (PROVENTIL) (2.5 MG/3ML) 0.083% nebulizer solution 2.5 mg  2.5 mg Nebulization Q4H PRN Ivor Costa, MD      . atorvastatin (LIPITOR) tablet 80 mg  80 mg Oral Daily Tawni Millers, MD   80 mg at 07/21/18 1052  . DULoxetine (CYMBALTA) DR capsule 60 mg  60 mg Oral Daily Arrien, Jimmy Picket, MD   60 mg at 07/21/18 1051  . enoxaparin (LOVENOX) injection 40 mg  40 mg Subcutaneous Q24H Arrien, Jimmy Picket, MD   40 mg at 07/21/18 1511  . escitalopram (LEXAPRO) tablet 10 mg  10 mg Oral Daily Arrien, Jimmy Picket, MD   10 mg at 07/21/18 1111  . folic acid (FOLVITE) tablet 1 mg  1 mg Oral Daily Ivor Costa, MD   1 mg at 07/21/18 1053  . magnesium sulfate 3 g in dextrose 5 % 100 mL IVPB  3 g Intravenous Once Reyne Dumas, MD 35.3 mL/hr at 07/21/18 1507 3 g at 07/21/18 1507  . methocarbamol (ROBAXIN) tablet 500 mg  500 mg Oral Q8H PRN Ivor Costa, MD   500 mg at 07/21/18 1118  . metoprolol succinate (TOPROL-XL) 24 hr tablet 50 mg  50 mg Oral Daily Arrien, Jimmy Picket, MD   50 mg at 07/21/18 0836  . mirabegron ER  (MYRBETRIQ) tablet 50 mg  50 mg Oral Daily Arrien, Jimmy Picket, MD   50 mg at 07/21/18 1113  . mometasone-formoterol (DULERA) 200-5 MCG/ACT inhaler 2 puff  2 puff Inhalation BID Arrien, Jimmy Picket, MD   2 puff at 07/21/18 [phone]  . morphine 2 MG/ML injection 2 mg  2 mg Intravenous Q4H PRN Ivor Costa, MD   2 mg at 07/19/18 9373  . multivitamin with minerals tablet 1 tablet  1 tablet Oral Daily Ivor Costa, MD   1 tablet at 07/21/18 1052  . nicotine (NICODERM CQ - dosed in mg/24 hours) patch 21 mg  21 mg Transdermal Daily Ivor Costa, MD   21 mg at 07/21/18 0837  . nitroGLYCERIN (NITROSTAT) SL tablet 0.4 mg  0.4 mg Sublingual Q5 min PRN Arrien, Jimmy Picket, MD      . ondansetron [facility]) tablet 4 mg  4 mg Oral Q6H PRN Arrien,  Jimmy Picket, MD       Or  . ondansetron [facility]) injection 4 mg  4 mg Intravenous Q6H PRN Arrien, Jimmy Picket, MD      . oxyCODONE-acetaminophen (PERCOCET/ROXICET) 5-325 MG per tablet 1 tablet  1 tablet Oral Q4H PRN Ivor Costa, MD   1 tablet at 07/19/18 0350  . pantoprazole (PROTONIX) EC tablet 40 mg  40 mg Oral Daily Arrien, Jimmy Picket, MD   40 mg at 07/21/18 1052  . senna-docusate (Senokot-S) tablet 2 tablet  2 tablet Oral Once Bodenheimer, Charles A, NP      . sodium zirconium cyclosilicate (LOKELMA) packet 5 g  5 g Oral BID Reyne Dumas, MD   5 g at 07/21/18 1143  . thiamine (VITAMIN B-1) tablet 100 mg  100 mg Oral Daily Ivor Costa, MD   100 mg at 07/21/18 1053   Or  . thiamine (B-1) injection 100 mg  100 mg Intravenous Daily Ivor Costa, MD   100 mg at 07/20/18 1039  . umeclidinium bromide (INCRUSE ELLIPTA) 62.5 MCG/INH 1 puff  1 puff Inhalation Daily Arrien, Jimmy Picket, MD   1 puff at 07/21/18 [phone]  . zolpidem (AMBIEN) tablet 5 mg  5 mg Oral QHS PRN Ivor Costa, MD   5 mg at 07/20/18 2319     Discharge Medications: Please see discharge summary for a list of discharge medications.  Relevant Imaging Results:  Relevant Lab  Results:   Additional Information ss#[id number]   Sable Feil, LCSW

## 2018-07-21 NOTE — Clinical Social Work Note (Signed)
Clinical Social Work Assessment  Patient Details  Name: Brad Turner MRN: [id number] Date of Birth: May 14, 1941  Date of referral:  07/20/18               Reason for consult:  Discharge Planning, Facility Placement                Permission sought to share information with:  Family Supports Permission granted to share information::  No(Patient did not want CSW to call his son, however patient not fully oriented. Son called while in patient's room.)  Name::        Agency::     Relationship::     Contact Information:     Housing/Transportation Living arrangements for the past 2 months:  Single Family Home Source of Information:  Adult Children(Son Brad Turner) Patient Interpreter Needed:  None Criminal Activity/Legal Involvement Pertinent to Current Situation/Hospitalization:  No - Comment as needed Significant Relationships:  Adult Children Lives with:  Self, Other (Comment)(Per son, his dad rents out a room to someone) Do you feel safe going back to the place where you live?  Yes(Son feels safe with his dad discharging home as he plans to stay with him until the per who lives with his [place] returns.) Need for family participation in patient care:  Yes (Comment)  Care giving concerns:  Patient declined SNF and reported that he has a life alert and a cell phone. Brad Turner is confused and could not discuss with CSW how he will be safe at home.  Social Worker assessment / plan: CSW talked with patient at the bedside regarding his discharge disposition and recommendation of ST rehab. Brad Turner was lying in bed and engaged in conversation with CSW, however his thoughts were scattered and disjointed. When asked, patient declined to allow CSW to contact his son, however call was made while in room with patient as, CSW determined that patient not oriented enough to make an appropriate decision regarding his son being contacted, as this was necessary to have a safe discharge plan for patient.    Talked with son, Brad Turner and he confirmed that he is an only child.  Brad Turner reported that someone lives with his dad (Pays rent to stay with patient) and will be at home with patient just about all [place]. This person is currently out-of-town, but will return Wednesday evening per son and Brad Turner indicated that he will stay with his dad until this person returns. Son reported that he works 6 pm until 54 am. Son confirmed that his dad has a life alert and cell phone and he use it after falling prior to admission to hospital and he was contacted and his dad was brought to hospital. Son in agreement with his dad discharging home and feels that he will have enough supervision between the person who lives with his dad and himself.   Employment status:  Retired Engineer, maintenance (IT)) PT Recommendations:  [place] / Referral to community resources:  Other (Comment Required)(Talked with son and he is fine with patient discharging home)  Patient/Family's Response to care:  No concerns expressed by patient or son regarding care during hospitalization.  Patient/Family's Understanding of and Emotional Response to Diagnosis, Current Treatment, and Prognosis:  Son appeared to understand the importance of his dad having appropriate supervision and explained how this will be achieved.  Emotional Assessment Appearance:  Appears stated age Attitude/Demeanor/Rapport:  Other(Confused, disjointed train of thought) Affect (typically observed):  Calm Orientation:  Oriented to Self(Not sure if patient oriented to place or time. Brad Turner is disoriented to situation) Alcohol / Substance use:  Tobacco Use, Alcohol Use, Illicit Drugs(Per H&P, patient smokes, drinks alcohol and does not use illicit drugs) Psych involvement (Current and /or in the community):  No (Comment)  Discharge Needs  Concerns to be addressed:  Discharge Planning Concerns, Home Safety  Concerns Readmission within the last 30 days:  No Current discharge risk:  None Barriers to Discharge:  Other(Determining that patient has a safe discharge plan)   Sable Feil, LCSW 07/21/2018, 6:21 PM

## 2018-07-22 DIAGNOSIS — W19XXXD Unspecified fall, subsequent encounter: Secondary | ICD-10-CM | POA: Diagnosis not present

## 2018-07-22 DIAGNOSIS — S42211A Unspecified displaced fracture of surgical neck of right humerus, initial encounter for closed fracture: Secondary | ICD-10-CM

## 2018-07-22 DIAGNOSIS — E871 Hypo-osmolality and hyponatremia: Secondary | ICD-10-CM

## 2018-07-22 LAB — BASIC METABOLIC PANEL
Anion gap: 12 (ref 5–15)
BUN: 12 mg/dL (ref 8–23)
CALCIUM: 8.7 mg/dL — AB (ref 8.9–10.3)
CO2: 21 mmol/L — AB (ref 22–32)
CREATININE: 0.94 mg/dL (ref 0.61–1.24)
Chloride: 98 mmol/L (ref 98–111)
GFR calc non Af Amer: >60 mL/min (ref >60)
Glucose, Bld: 85 mg/dL (ref 70–99)
Potassium: 3.7 mmol/L (ref 3.5–5.1)
Sodium: 131 mmol/L — ABNORMAL LOW (ref 135–145)

## 2018-07-22 LAB — MAGNESIUM: Magnesium: 1.7 mg/dL (ref 1.7–2.4)

## 2018-07-22 MED ORDER — THIAMINE HCL 100 MG PO TABS: 100.0000 mg | ORAL_TABLET | Freq: Every day | ORAL | 1 refills | Status: AC

## 2018-07-22 MED ORDER — NICOTINE 21 MG/24HR TD PT24: 21.0000 mg | MEDICATED_PATCH | Freq: Every day | TRANSDERMAL | 0 refills | Status: AC

## 2018-07-22 MED ORDER — MAGNESIUM OXIDE 400 MG PO CAPS: 1.0000 | ORAL_CAPSULE | Freq: Two times a day (BID) | ORAL | 0 refills | Status: AC

## 2018-07-22 MED ORDER — AMLODIPINE BESYLATE 5 MG PO TABS
5.0000 mg | ORAL_TABLET | Freq: Every day | ORAL | Status: DC
  Administered 2018-07-22: 5 mg via ORAL
  Filled 2018-07-22: qty 1

## 2018-07-22 MED ORDER — AMLODIPINE BESYLATE 5 MG PO TABS: 5.0000 mg | ORAL_TABLET | Freq: Every day | ORAL | 3 refills | Status: AC

## 2018-07-22 NOTE — Progress Notes (Signed)
Still awaiting response paged Chaney Malling NP again.

## 2018-07-22 NOTE — Progress Notes (Signed)
Patient discharged to home. Patient AVS reviewed and signed. Patient capable re-verbalizing medications and follow-up appointments. IV removed. Patient belongings sent with patient. Patient educated to return to the ED in the event of SOB, chest pain or dizziness.   Algernon Mundie B. RN 

## 2018-07-22 NOTE — Progress Notes (Signed)
Patient now frequently setting bed alarm off and trying to get out of bed. Asked patient where he is trying to go. Patient stated," I have to get to the bar before they close.I need a drink.You can come if you want to."Patient with CIWA score 14 . Text paged Chaney Malling NP for new orders.

## 2018-07-22 NOTE — Care Management Note (Addendum)
Case Management Note  Patient Details  Name: Brad Turner MRN: [id number] Date of Birth: Jun 09, 1941  Subjective/Objective:  Pt presented for  Right arm pain 2/2 fall. PTA from home- has a roommate that rents a room in the home. Roommate is out of town at this time and will be returning soon.  Family is declining SNF at this time and son wants to take the patient home. Per son he will stay with dad tonight and provide additional supervision.                Action/Plan: Pt has DME RW in the home-no DME needs at this time. CM did offer choice to son [name] and patient has used [facility] in the past and wants to utilize again. CM did make referral via voicemail with [facility]ch Penny of [facility]- awaiting call back. CM did ask RN time for patient readiness and agreed upon 2:00 pm. Son is aware to pick up at 2:00 pm. PT/OT may work with patient to see if patient can get up to at least get into wheelchair to transport to car to home. No further needs from CM at this time.   Expected Discharge Date:  07/22/18               Expected Discharge Plan:  [place]  In-House Referral:  Clinical Social Work  Discharge planning Services  CM Consult  Post Acute Care Choice:  Home Health Choice offered to:  Adult Children  DME Arranged:  N/A DME Agency:  NA  HH Arranged:  PT, OT, Nurse's Aide HH Agency:[facility]    Status of Service:  Completed, signed off  If discussed at [place] of Stay Meetings, dates discussed:    Additional Comments:  1232 07-22-18 Jacqlyn Krauss, RN,BSN [phone] Case Manager [phone] [facility] had a 2-3 Sugrue wait for therapy. CM did call [facility] and will be able to see patient within 24 hours. CM did call son [name] and is aware of agency change. No further needs from CM at this time.    1213 07-22-18 Jacqlyn Krauss, RN,BSN [phone] CM did receive call back from [facility] and they will not be able to service the patient 2/2 agency  cannot support OT in [place]. CM did call [facility] to see if they can support the patients needs in the home. Awaiting call back from [place].   Bethena Roys, RN 07/22/2018, 12:02 PM

## 2018-07-22 NOTE — Progress Notes (Signed)
New orders received for ativan will give and monitor patient.

## 2018-07-22 NOTE — Progress Notes (Signed)
Patient is confused and becoming restless.Patient does not believe he is in the hospital.Attempting to re orient patient but unsuccessful .Patient thinks he is at home and that I'm at his house.

## 2018-07-22 NOTE — Discharge Summary (Addendum)
Physician Discharge Summary   Patient ID: Brad Turner MRN: [id number] DOB/AGE: 1941-07-08 77 y.o.  Admit date: 07/18/2018 Discharge date: 07/22/2018  Primary Care Physician:  Brad Pao, MD   Recommendations for Outpatient Follow-up:  1. Follow up with PCP in 1-2 weeks 2. Please obtain BMP/CBC in one week  Home Health: Home health PT OT Equipment/Devices:   Discharge Condition: stable  CODE STATUS: FULL  Diet recommendation:    Discharge Diagnoses:    Right proximal humeral fracture Acute kidney injury Hyponatremia Essential hypertension COPD Dyslipidemia Depression History of alcohol  Consults: Orthopedic    Allergies:   Allergies  Allergen Reactions  . Iodinated Diagnostic Agents Hives, Shortness Of Breath and Rash    Reaction was when pt was 77 years old   . Macrodantin [Nitrofurantoin] Other (See Comments)    Fever that lasted several months     DISCHARGE MEDICATIONS: Allergies as of 07/22/2018      Reactions   Iodinated Diagnostic Agents Hives, Shortness Of Breath, Rash   Reaction was when pt was 77 years old   Macrodantin [nitrofurantoin] Other (See Comments)   Fever that lasted several months      Medication List    STOP taking these medications   cephALEXin 500 MG capsule Commonly known as:  KEFLEX     TAKE these medications   acetaminophen 650 MG CR tablet Commonly known as:  TYLENOL Take 650 mg by mouth every 8 (eight) hours as needed for pain.   albuterol 108 (90 Base) MCG/ACT inhaler Commonly known as:  PROVENTIL HFA;VENTOLIN HFA Inhale 2 puffs into the lungs every 6 (six) hours as needed for wheezing or shortness of breath. Reported on 11/29/2015   amLODipine 5 MG tablet Commonly known as:  NORVASC Take 1 tablet (5 mg total) by mouth daily.   arformoterol 15 MCG/2ML Nebu Commonly known as:  BROVANA Take 2 mLs (15 mcg total) by nebulization 2 (two) times daily.   atorvastatin 80 MG tablet Commonly known as:   LIPITOR Take 80 mg by mouth daily.   clopidogrel 75 MG tablet Commonly known as:  PLAVIX Take 1 tablet (75 mg total) by mouth daily. Restart in 7-10days after follow up with ENT Dr,[place]   DULoxetine 60 MG capsule Commonly known as:  CYMBALTA Take 60 mg by mouth daily.   ENSURE Take 237 mLs by mouth 2 (two) times daily between meals.   escitalopram 10 MG tablet Commonly known as:  LEXAPRO Take 10 mg by mouth daily.   folic acid 409 MCG tablet Commonly known as:  FOLVITE Take 400 mcg by mouth daily.   folic acid 1 MG tablet Commonly known as:  FOLVITE Take 1 tablet (1 mg total) by mouth daily.   Magnesium Oxide 400 MG Caps Take 1 capsule (400 mg total) by mouth 2 (two) times daily. 1 tablet twice a [place]   metoprolol succinate 50 MG 24 hr tablet Commonly known as:  TOPROL-XL Take 1 tablet (50 mg total) by mouth daily. Take with or immediately following a meal.   MYRBETRIQ 50 MG Tb24 tablet Generic drug:  mirabegron ER Take 50 mg by mouth daily.   nicotine 21 mg/24hr patch Commonly known as:  NICODERM CQ - dosed in mg/24 hours Place 1 patch (21 mg total) onto the skin daily.   NITROSTAT 0.4 MG SL tablet Generic drug:  nitroGLYCERIN Place 0.4 mg under the tongue every 5 (five) minutes as needed for chest pain. Reported on 11/29/2015   omeprazole  40 MG capsule Commonly known as:  PRILOSEC TAKE ONE CAPSULE BY MOUTH TWICE DAILY   oxyCODONE-acetaminophen 5-325 MG tablet Commonly known as:  PERCOCET/ROXICET Take 1 tablet by mouth every 4 (four) hours as needed for moderate pain.   potassium chloride SA 20 MEQ tablet Commonly known as:  K-DUR,KLOR-CON Take 2 tablets (40 mEq total) by mouth daily.   potassium chloride SA 20 MEQ tablet Commonly known as:  K-DUR,KLOR-CON Take 2 tablets (40 mEq total) by mouth daily.   sodium chloride 0.65 % Soln nasal spray Commonly known as:  OCEAN Place 1 spray into both nostrils as needed for congestion.   SPIRIVA RESPIMAT 2.5  MCG/ACT Aers Generic drug:  Tiotropium Bromide Monohydrate Inhale 2 puffs into the lungs daily.   SYMBICORT 160-4.5 MCG/ACT inhaler Generic drug:  budesonide-formoterol INHALE TWO PUFFS INTO LUNGS TWICE DAILY What changed:  See the new instructions.   thiamine 100 MG tablet Take 1 tablet (100 mg total) by mouth daily.   valsartan 160 MG tablet Commonly known as:  DIOVAN Take 160 mg by mouth daily.     ASK your doctor about these medications   senna-docusate 8.6-50 MG tablet Commonly known as:  Senokot-S Take 2 tablets by mouth once for 1 dose. Ask about: Should I take this medication?        Brief H and P: For complete details please refer to admission H and P, but in brief  76 y.o.malewith medical history significant ofCOPD with emphysema, GERD, history of stroke, arthritis, ambulatory dysfunction,andrecent crush injury to hisleftring finger.Patient suffered a mechanical fall, he slipped while ambulating with his walker, he landed on his rightside, positive head trauma but no loss of consciousness, post event he had significant pain on his rightarm.  Noted to have right displaced humeral neck fracture this admission In August 2019, patient sustained a nasal fracture and maxillary sinus fracture and multiple skin tears from a fall  Hospital Course:   1 right proximal humeral fracture -Patient presented with falls, was found to have right displaced humeral neck fracture -Treated with pain control, right upper extremity immobilization with sling -Orthopedics was consulted, evaluated by Brad Rising, PA/[facility], recommended follow-up with Dr. Tamera Turner in 1 week to discuss ORIF versus conservative management -He will likely need cardiac clearance because of his ischemic cardiomyopathy. -Continue nonweightbearing with regards to the right shoulder. -PT recommended skilled nursing facility however at this time family declined skilled nursing facility and patient's son  wanted to take the patient home, stated that he will stay with his father and provide additional supervision.  Acute kidney injury with hyponatremia, hypomagnesemia Likely prerenal kidney injury, patient was placed on IV fluids Electrolytes corrected, sodium 131 at the time of discharge, potassium 3.7, magnesium 1.7   Essential hypertension -Continue metoprolol 50 mg daily, irbesartan was placed on hold due to hyperkalemia and acute kidney injury Started on Norvasc 5 mg daily Follow-up with PCP  COPD Currently stable, no acute exacerbation  Dyslipidemia Continue Lipitor, Plavix  Depression with history of alcohol use Continue duloxetine and escitalopram Not in any withdrawals  [place] of Discharge S: Denies any specific complaints, pain controlled  BP (!) 180/99 (BP Location: Left Arm)   Pulse 69   Temp 97.7 F (36.5 C) (Oral)   Resp 18   SpO2 98%   Physical Exam: General: Alert and awake oriented x3 not in any acute distress. HEENT: anicteric sclera, pupils reactive to light and accommodation CVS: S1-S2 clear no murmur rubs or gallops Chest:  clear to auscultation bilaterally, no wheezing rales or rhonchi Abdomen: soft nontender, nondistended, normal bowel sounds Extremities: Right shoulder in sling Neuro: Cranial nerves II-XII intact, no focal neurological deficits   The results of significant diagnostics from this hospitalization (including imaging, microbiology, ancillary and laboratory) are listed below for reference.      Procedures/Studies:  Dg Chest 1 View  Result Date: 07/19/2018 CLINICAL DATA:  Status post fall. Back pain. EXAM: CHEST  1 VIEW COMPARISON:  04/26/2018, 03/11/2018 FINDINGS: There is stable blunting of the left costophrenic angle which may reflect a small pleural effusion versus scarring. There is stable left basilar airspace disease which may reflect atelectasis versus scarring. There is no right pleural effusion. There is no pneumothorax. The  heart and mediastinal contours are unremarkable. The osseous structures are unremarkable. IMPRESSION: Stable blunting of the left costophrenic angle which may reflect a small pleural effusion versus scarring. Stable left basilar airspace disease which may reflect atelectasis versus scarring. Electronically Signed   By: Kathreen Devoid   On: 07/19/2018 01:36   Dg Thoracic Spine 2 View  Result Date: 07/19/2018 CLINICAL DATA:  Status post fall, back pain EXAM: THORACIC SPINE 2 VIEWS COMPARISON:  None. FINDINGS: There is no evidence of thoracic spine fracture. Generalized osteopenia. Alignment is normal. No other significant bone abnormalities are identified. Mild degenerative disc disease throughout the thoracic spine. IMPRESSION: No acute osseous injury of the thoracic spine. Electronically Signed   By: Kathreen Devoid   On: 07/19/2018 01:40   Dg Lumbar Spine 2-3 Views  Result Date: 07/19/2018 CLINICAL DATA:  Status post fall, back pain EXAM: LUMBAR SPINE - 2-3 VIEW COMPARISON:  CT lumbar spine 06/16/2018 FINDINGS: There is no evidence of lumbar spine fracture.  Alignment is normal. Mild disc space loss at L2-3. Posterior spinal fusion from L4 through S1. Bilateral L4 pedicle screws demonstrate lucency around the screws consistent with loosening with the tip eroding through the superior endplate into the H8-5 disc space. Thoracic aortic atherosclerosis. IMPRESSION: 1.  No acute osseous injury of the lumbar spine. Electronically Signed   By: Kathreen Devoid   On: 07/19/2018 01:44   Dg Shoulder Right  Result Date: 07/19/2018 CLINICAL DATA:  Status post fall, right shoulder pain EXAM: RIGHT SHOULDER - 2+ VIEW COMPARISON:  None. FINDINGS: Generalized osteopenia. Displaced fracture of the surgical neck of the right proximal humerus with 11 mm of lateral displacement. No glenohumeral dislocation. Mild arthropathy of the acromioclavicular joint and glenohumeral joint. Small bone island in the proximal humeral  diaphysis. IMPRESSION: Displaced fracture of the surgical neck of the right proximal humerus. Electronically Signed   By: Kathreen Devoid   On: 07/19/2018 01:38   Dg Elbow Complete Right  Result Date: 07/19/2018 CLINICAL DATA:  Status post fall, right elbow pain EXAM: RIGHT ELBOW - COMPLETE 3+ VIEW COMPARISON:  None. FINDINGS: There is no evidence of fracture, dislocation, or joint effusion. There is no evidence of arthropathy or other focal bone abnormality. Soft tissues are unremarkable. IMPRESSION: Negative. Electronically Signed   By: Kathreen Devoid   On: 07/19/2018 01:39   Dg Forearm Right  Result Date: 07/19/2018 CLINICAL DATA:  Fall EXAM: RIGHT FOREARM - 2 VIEW COMPARISON:  None. FINDINGS: There is no evidence of fracture or other focal bone lesions. Soft tissues are unremarkable. IMPRESSION: Negative. Electronically Signed   By: Ulyses Jarred M.D.   On: 07/19/2018 01:42   Ct Head Wo Contrast  Result Date: 07/19/2018 CLINICAL DATA:  Slipped on ice cube.  Fell backward and hit head and back. Concern for cervical spine injury. Patient was on Plavix until 3 days ago. Initial encounter. EXAM: CT HEAD WITHOUT CONTRAST CT CERVICAL SPINE WITHOUT CONTRAST TECHNIQUE: Multidetector CT imaging of the head and cervical spine was performed following the standard protocol without intravenous contrast. Multiplanar CT image reconstructions of the cervical spine were also generated. COMPARISON:  CT of the head and cervical spine performed 05/14/2018 FINDINGS: CT HEAD FINDINGS Brain: No evidence of acute infarction, hemorrhage, hydrocephalus, extra-axial collection or mass lesion / mass effect. Prominence of the ventricles and sulci reflects mild cortical volume loss. Mild cerebellar atrophy is noted. Scattered periventricular and subcortical white matter change likely reflects small vessel ischemic microangiopathy. Small chronic lacunar infarcts are noted at the basal ganglia bilaterally, and at the right thalamus.  The brainstem and fourth ventricle are within normal limits. The cerebral hemispheres demonstrate grossly normal gray-white differentiation. No mass effect or midline shift is seen. Vascular: No hyperdense vessel or unexpected calcification. Skull: There is no evidence of fracture; visualized osseous structures are unremarkable in appearance. Sinuses/Orbits: The orbits are within normal limits. There is mild partial opacification of the mastoid air cells bilaterally. The paranasal sinuses are well-aerated. Other: No significant soft tissue abnormalities are seen. CT CERVICAL SPINE FINDINGS Alignment: There is grade 1 anterolisthesis of C2 on C3 and of C3 on C4, reflecting underlying facet disease. Skull base and vertebrae: No acute fracture. No primary bone lesion or focal pathologic process. Soft tissues and spinal canal: No prevertebral fluid or swelling. No visible canal hematoma. Disc levels: Multilevel disc space narrowing is noted along the lower cervical spine, with scattered anterior and posterior disc osteophyte complexes, and underlying bony foraminal narrowing. Upper chest: Hypodensities within the right thyroid lobe measure up to 1.3 cm in size, likely benign given their size. Scattered calcification is noted at the carotid bifurcations bilaterally. Other: No additional soft tissue abnormalities are seen. IMPRESSION: 1. No evidence of traumatic intracranial injury or fracture. 2. No evidence of fracture or subluxation along the cervical spine. 3. Mild cortical volume loss and scattered small vessel ischemic microangiopathy. 4. Small chronic lacunar infarcts at the basal ganglia bilaterally, and at the right thalamus. 5. Mild degenerative change along the cervical spine. 6. Mild partial opacification of the mastoid air cells bilaterally. 7. Scattered calcification at the carotid bifurcations bilaterally. Carotid ultrasound would be helpful for further evaluation, when and as deemed clinically appropriate.  Electronically Signed   By: Garald Balding M.D.   On: 07/19/2018 01:05   Ct Cervical Spine Wo Contrast  Result Date: 07/19/2018 CLINICAL DATA:  Slipped on ice cube. Fell backward and hit head and back. Concern for cervical spine injury. Patient was on Plavix until 3 days ago. Initial encounter. EXAM: CT HEAD WITHOUT CONTRAST CT CERVICAL SPINE WITHOUT CONTRAST TECHNIQUE: Multidetector CT imaging of the head and cervical spine was performed following the standard protocol without intravenous contrast. Multiplanar CT image reconstructions of the cervical spine were also generated. COMPARISON:  CT of the head and cervical spine performed 05/14/2018 FINDINGS: CT HEAD FINDINGS Brain: No evidence of acute infarction, hemorrhage, hydrocephalus, extra-axial collection or mass lesion / mass effect. Prominence of the ventricles and sulci reflects mild cortical volume loss. Mild cerebellar atrophy is noted. Scattered periventricular and subcortical white matter change likely reflects small vessel ischemic microangiopathy. Small chronic lacunar infarcts are noted at the basal ganglia bilaterally, and at the right thalamus. The brainstem and fourth ventricle are within normal limits. The cerebral  hemispheres demonstrate grossly normal gray-white differentiation. No mass effect or midline shift is seen. Vascular: No hyperdense vessel or unexpected calcification. Skull: There is no evidence of fracture; visualized osseous structures are unremarkable in appearance. Sinuses/Orbits: The orbits are within normal limits. There is mild partial opacification of the mastoid air cells bilaterally. The paranasal sinuses are well-aerated. Other: No significant soft tissue abnormalities are seen. CT CERVICAL SPINE FINDINGS Alignment: There is grade 1 anterolisthesis of C2 on C3 and of C3 on C4, reflecting underlying facet disease. Skull base and vertebrae: No acute fracture. No primary bone lesion or focal pathologic process. Soft tissues  and spinal canal: No prevertebral fluid or swelling. No visible canal hematoma. Disc levels: Multilevel disc space narrowing is noted along the lower cervical spine, with scattered anterior and posterior disc osteophyte complexes, and underlying bony foraminal narrowing. Upper chest: Hypodensities within the right thyroid lobe measure up to 1.3 cm in size, likely benign given their size. Scattered calcification is noted at the carotid bifurcations bilaterally. Other: No additional soft tissue abnormalities are seen. IMPRESSION: 1. No evidence of traumatic intracranial injury or fracture. 2. No evidence of fracture or subluxation along the cervical spine. 3. Mild cortical volume loss and scattered small vessel ischemic microangiopathy. 4. Small chronic lacunar infarcts at the basal ganglia bilaterally, and at the right thalamus. 5. Mild degenerative change along the cervical spine. 6. Mild partial opacification of the mastoid air cells bilaterally. 7. Scattered calcification at the carotid bifurcations bilaterally. Carotid ultrasound would be helpful for further evaluation, when and as deemed clinically appropriate. Electronically Signed   By: Garald Balding M.D.   On: 07/19/2018 01:05   Dg Humerus Right  Result Date: 07/19/2018 CLINICAL DATA:  Fall EXAM: RIGHT HUMERUS - 2+ VIEW COMPARISON:  Right shoulder radiograph same [place] FINDINGS: Redemonstration of angulated fracture of the proximal right humerus. No distal humerus fracture. IMPRESSION: No distal humerus fracture. The angulated fracture of the proximal humerus is better demonstrated on the earlier shoulder radiograph. Electronically Signed   By: Ulyses Jarred M.D.   On: 07/19/2018 01:42      LAB RESULTS: Basic Metabolic Panel: Recent Labs  Lab 07/21/18 1533 07/22/18 0547  NA 129* 131*  K 3.8 3.7  CL 98 98  CO2 22 21*  GLUCOSE 149* 85  BUN 8 12  CREATININE 0.85 0.94  CALCIUM 8.7* 8.7*  MG  --  1.7   Liver Function Tests: Recent Labs   Lab 07/19/18 0028  AST 38  ALT 18  ALKPHOS 155*  BILITOT 1.2  PROT 7.1  ALBUMIN 3.1*   No results for input(s): LIPASE, AMYLASE in the last 168 hours. No results for input(s): AMMONIA in the last 168 hours. CBC: Recent Labs  Lab 07/19/18 0028 07/20/18 0519  WBC 9.6 6.0  NEUTROABS 6.7  --   HGB 10.5* 9.9*  HCT 33.4* 30.4*  MCV 101.8* 99.3  PLT 414* 336   Cardiac Enzymes: Recent Labs  Lab 07/19/18 0038 07/20/18 0925  TROPONINI <0.03 <0.03   BNP: Invalid input(s): POCBNP CBG: No results for input(s): GLUCAP in the last 168 hours.    Disposition and Follow-up: Discharge Instructions    Diet - low sodium heart healthy   Complete by:  As directed    Increase activity slowly   Complete by:  As directed        DISPOSITION: Home with home health   [place]    Malena Catholic, MD. Schedule an appointment as soon  as possible for a visit in 1 week(s).   Contact information: [place] [address] [phone]        [name], [name] Him, MD. Call.   Specialty:  Internal Medicine Why:  Hospital follow-up in 3-5 days Contact information: [place] [address] [phone]        [name], MD .   Specialty:  Cardiology Contact information: [phone] [address] [place] [address] [phone]        Care, [facility] Follow up.   Specialty:  Home Health Services Why:  Physical/Occupational Therapy, Aide Contact information: [place] [address] [phone]            Time coordinating discharge:  35 minutes  Signed:   Estill Cotta M.D. [facility] 07/22/2018, 1:11 PM Pager: [phone]   Coding query Patient had a history of alcohol abuse. Per admission history by Dr Cathlean Sauer " reports that he drinks about 14.0 standard drinks of alcohol per week".  Patient did not go through any kind of  withdrawals.   Estill Cotta M.D. [facility] 07/27/2018, 5:40 PM  Pager: [phone]

## 2018-08-02 ENCOUNTER — Emergency Department (HOSPITAL_COMMUNITY)
Admission: EM | Admit: 2018-08-02 | Discharge: 2018-08-03 | Disposition: A | Discharge status: Home / Self Care | Payer: Medicare Other | Attending: Emergency Medicine | Admitting: Emergency Medicine

## 2018-08-02 ENCOUNTER — Encounter (HOSPITAL_COMMUNITY): Payer: Self-pay | Admitting: Emergency Medicine

## 2018-08-02 DIAGNOSIS — J449 Chronic obstructive pulmonary disease, unspecified: Secondary | ICD-10-CM | POA: Insufficient documentation

## 2018-08-02 DIAGNOSIS — I951 Orthostatic hypotension: Secondary | ICD-10-CM | POA: Insufficient documentation

## 2018-08-02 DIAGNOSIS — R296 Repeated falls: Secondary | ICD-10-CM | POA: Diagnosis not present

## 2018-08-02 DIAGNOSIS — I10 Essential (primary) hypertension: Secondary | ICD-10-CM | POA: Diagnosis not present

## 2018-08-02 DIAGNOSIS — Z79899 Other long term (current) drug therapy: Secondary | ICD-10-CM | POA: Insufficient documentation

## 2018-08-02 DIAGNOSIS — Z85828 Personal history of other malignant neoplasm of skin: Secondary | ICD-10-CM | POA: Insufficient documentation

## 2018-08-02 DIAGNOSIS — I2510 Atherosclerotic heart disease of native coronary artery without angina pectoris: Secondary | ICD-10-CM | POA: Diagnosis not present

## 2018-08-02 DIAGNOSIS — F17210 Nicotine dependence, cigarettes, uncomplicated: Secondary | ICD-10-CM | POA: Insufficient documentation

## 2018-08-02 MED ORDER — SODIUM CHLORIDE 0.9 % IV BOLUS
500.0000 mL | Freq: Once | INTRAVENOUS | Status: AC
  Administered 2018-08-03: 500 mL via INTRAVENOUS

## 2018-08-02 NOTE — ED Provider Notes (Signed)
[facility] EMERGENCY DEPARTMENT Provider Note   CSN: [id number] Arrival date & time: 08/02/18  2322     History   Chief Complaint Chief Complaint  Patient presents with  . Fall    HPI Brad Turner is a 77 y.o. male.  Patient presents to the emergency department for evaluation after a fall.  Patient reports that he fell at home.  He has been sitting in his kitchen for a while, stood up and then fell backwards.  He has had recent falls secondary to generalized weakness.  Patient also reports that he has a long history of orthostatic hypotension and syncope.  Patient had a fall 2 weeks ago and suffered a fracture of his right shoulder.  He has not noticed any new pain since the fall tonight.  EMS report that he was awake and alert when they arrived on the scene.  His initial blood pressure was normal, but when they stood him up he dropped significantly.  He has been given a small amount of fluid during transport.     Past Medical History:  Diagnosis Date  . Alcohol withdrawal ([place])   . Allergy    seasonal  . Arthritis   . Barrett esophagus   . BPH (benign prostatic hyperplasia)   . CAD (coronary artery disease)    a. s/p multiple POBA last in 2011 to distal OM2. b. Takotsubo event 09/2017 with cath showing no culprit, 25% prox LAD, 60% OM1, 65% OM2, EF 35-40% by cath and 30-35% by echo.  . Chronic back pain   . COPD (chronic obstructive pulmonary disease) (HCC)    Albuterol inhaler as needed.Uses Symbicort daily  . Depression   . Emphysema lung ([place])   . Fall    fell on 10/06/16 with loss of consiousness for a short time.States he remembers getting up but remembers nothing else.  . Gastric ulcer   . Gastritis   . GERD (gastroesophageal reflux disease)    takes Omeprazole daily  . History of colon polyps    benign  . History of kidney stones   . Hyperlipidemia   . Hypertension   . Hyponatremia   . 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 Neuropathy   . Nocturia   . Pneumonia    hx of  . PONV (postoperative nausea and vomiting)   . Skin cancer   . Stroke [facility]) 1995   Affected balance; and has very emotional if watching a movie  . Syncope    yr ago  . Takotsubo cardiomyopathy    a. 09/2017 - EF 35-40% by cath, 30-35% by echo.  . Urgency of urination    takes Myrbetriq daily    Patient Active Problem List   Diagnosis Date Noted  . Nail deformity 05/08/2018  . Fall 04/26/2018  . Nasal bone fracture 04/26/2018  . Maxillary sinus fracture ([place]) 04/26/2018  . Multiple skin tears   . Alcoholic intoxication without complication ([place])   . Liver mass 01/01/2018  . Adjustment disorder with depressed mood 12/29/2017  . Neurocognitive disorder 12/21/2017  . Adult failure to thrive 12/09/2017  . Pressure injury of skin 12/01/2017  . Empyema ([place]) 11/24/2017  . Iron deficiency anemia 11/22/2017  . Acute urinary retention 11/22/2017  . Hypokalemia   . Hypomagnesemia   . Parapneumonic effusion   . Sepsis due to pneumonia ([place])   . ETOH abuse 11/21/2017  . Acute respiratory  failure ([place]) 11/21/2017  . HCAP (healthcare-associated pneumonia) 11/21/2017  . Sepsis ([place]) 11/21/2017  . Pleural effusion 11/21/2017  . Acute kidney injury ([place]) 11/21/2017  . Abdominal pain 11/21/2017  . Malnutrition of moderate degree 09/24/2017  . Acute delirium 09/16/2017  . Acute hyponatremia 09/16/2017  . Alcohol withdrawal ([place]) 09/16/2017  . 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 Tobacco abuse 09/16/2017  . Depression 09/16/2017  . History of CVA (cerebrovascular accident) 09/16/2017  . History of Barrett's esophagus 09/16/2017  . Takotsubo cardiomyopathy 09/15/2017  . Chronic back pain 12/01/2016  . S/P lumbar spinal fusion 12/01/2016  . Dysthymia 12/01/2016  . Radiculopathy 10/17/2016  . Abnormal CT of the chest 06/10/2016  . Dysphagia   . Essential hypertension,  benign 10/30/2014    Class: Chronic  . Syncope 10/29/2014  . Cough syncope 10/07/2014  . COPD with acute exacerbation ([place]) 09/29/2014  . Pulmonary nodules 11/22/2013  . Personal history of colonic polyps 06/04/2010  . COPD (chronic obstructive pulmonary disease) with emphysema ([place]) 02/26/2010  . CHEST PAIN, ATYPICAL 02/26/2010  . BARRETTS ESOPHAGUS 05/18/2009  . HLD (hyperlipidemia) 04/28/2008  . Coronary atherosclerosis 04/28/2008  . CVA 04/28/2008  . GERD 04/28/2008  . ARTHRITIS 04/28/2008  . NEPHROLITHIASIS, HX OF 04/28/2008    Past Surgical History:  Procedure Laterality Date  . APPENDECTOMY    . CARDIAC CATHETERIZATION     x 4. Most recent one in 2011 at [facility](3 previous were done 20 yrs before)  . cataracts Bilateral   . CHEST EXPLORATION Left 11/24/2017   Procedure: CHEST EXPLORATION;  Surgeon: [name], MD;  Location: [facility];  Service: Thoracic;  Laterality: Left;  . COLONOSCOPY    . CYSTOSCOPY  07/21/2013   Dr. Jeffie Pollock  . DENTAL SURGERY     with sedation  . ESOPHAGEAL MANOMETRY N/A 09/20/2015   Procedure: ESOPHAGEAL MANOMETRY (EM);  Surgeon: Manus Gunning, MD;  Location: [facility];  Service: Gastroenterology;  Laterality: N/A;  . LEFT HEART CATH AND CORONARY ANGIOGRAPHY N/A 09/15/2017   Procedure: LEFT HEART CATH AND CORONARY ANGIOGRAPHY;  Surgeon: Martinique, Peter M, MD;  Location: [facility];  Service: Cardiovascular;  Laterality: N/A;  . LUMBAR LAMINECTOMY/DECOMPRESSION MICRODISCECTOMY N/A 01/12/2015   Procedure: LUMBAR LAMINECTOMY/DECOMPRESSION MICRODISCECTOMY 3 LEVELS;  Surgeon: Phylliss Bob, MD;  Location: [facility];  Service: Orthopedics;  Laterality: N/A;  Lumbar 3-4, lumbar 4-5, lumbar 5-sacrum 1 decompression  . parathyroid adenoma removed    . POLYPECTOMY    . ROTATOR CUFF REPAIR     bilateral  . TONSILLECTOMY    . UPPER GASTROINTESTINAL ENDOSCOPY    . VIDEO ASSISTED THORACOSCOPY (VATS)/EMPYEMA Left 11/24/2017   Procedure: VIDEO  ASSISTED THORACOSCOPY (VATS)/EMPYEMA;  Surgeon: [name], MD;  Location: [facility];  Service: Thoracic;  Laterality: Left;  Marland Kitchen VIDEO BRONCHOSCOPY N/A 11/24/2017   Procedure: VIDEO BRONCHOSCOPY;  Surgeon: [name], MD;  Location: [facility] OR;  Service: Thoracic;  Laterality: N/A;        Home Medications    Prior to Admission medications   Medication Sig Start Date End Date Taking? Authorizing Provider  acetaminophen (TYLENOL) 650 MG CR tablet Take 650 mg by mouth every 8 (eight) hours as needed for pain.    Yes [provider]  albuterol (PROVENTIL HFA;VENTOLIN HFA) 108 (90 BASE) MCG/ACT inhaler Inhale 2 puffs into the lungs every 6 (six) hours as needed for wheezing or shortness of breath. Reported on 11/29/2015   Yes [provider]  amLODipine (NORVASC) 5 MG tablet Take 1 tablet (5 mg total) by mouth daily. 07/22/18  Yes Rai, Ripudeep K, MD  atorvastatin (LIPITOR) 80 MG tablet Take 80 mg by mouth daily.    Yes [provider]  DULoxetine (CYMBALTA) 60 MG capsule Take 60 mg by mouth daily.   Yes [provider]  ENSURE (ENSURE) Take 237 mLs by mouth 2 (two) times daily between meals.   Yes [provider]  escitalopram (LEXAPRO) 10 MG tablet Take 10 mg by mouth daily. 04/08/18  Yes [provider]  folic acid (FOLVITE) 664 MCG tablet Take 400 mcg by mouth daily.   Yes [provider]  Magnesium Oxide 400 MG CAPS Take 1 capsule (400 mg total) by mouth 2 (two) times daily. 1 tablet twice a [place] 07/22/18  Yes Rai, Ripudeep K, MD  metoprolol succinate (TOPROL-XL) 50 MG 24 hr tablet Take 1 tablet (50 mg total) by mouth daily. Take with or immediately following a meal. 09/19/17  Yes Duke, Tami Lin, PA  MYRBETRIQ 50 MG TB24 tablet Take 50 mg by mouth daily.  05/03/15  Yes [provider]  nicotine (NICODERM CQ - DOSED IN MG/24 HOURS) 21 mg/24hr patch Place 1 patch (21 mg total) onto the skin daily. 07/22/18  Yes Rai,  Ripudeep K, MD  NITROSTAT 0.4 MG SL tablet Place 0.4 mg under the tongue every 5 (five) minutes as needed for chest pain. Reported on 11/29/2015 05/29/13  Yes [provider]  omeprazole (PRILOSEC) 40 MG capsule TAKE ONE CAPSULE BY MOUTH TWICE DAILY Patient taking differently: Take 40 mg by mouth 2 (two) times daily.  04/22/16  Yes Armbruster, Carlota Raspberry, MD  oxyCODONE-acetaminophen (PERCOCET/ROXICET) 5-325 MG tablet Take 1 tablet by mouth every 4 (four) hours as needed for moderate pain. 07/20/18  Yes Reyne Dumas, MD  potassium chloride SA (K-DUR,KLOR-CON) 20 MEQ tablet Take 2 tablets (40 mEq total) by mouth daily. 07/21/18  Yes Reyne Dumas, MD  SYMBICORT 160-4.5 MCG/ACT inhaler INHALE TWO PUFFS INTO LUNGS TWICE DAILY Patient taking differently: Inhale 2 puffs into the lungs 2 (two) times daily.  04/19/14  Yes Clance, Armando Reichert, MD  thiamine 100 MG tablet Take 1 tablet (100 mg total) by mouth daily. 07/22/18  Yes Rai, Ripudeep K, MD  Tiotropium Bromide Monohydrate (SPIRIVA RESPIMAT) 2.5 MCG/ACT AERS Inhale 2 puffs into the lungs daily.    Yes [provider]  valsartan (DIOVAN) 160 MG tablet Take 160 mg by mouth daily. 04/07/18  Yes [provider]  arformoterol (BROVANA) 15 MCG/2ML NEBU Take 2 mLs (15 mcg total) by nebulization 2 (two) times daily. Patient not taking: Reported on 07/19/2018 02/09/18   Noralee Space, MD  clopidogrel (PLAVIX) 75 MG tablet Take 1 tablet (75 mg total) by mouth daily. Restart in 7-10days after follow up with ENT Dr,[place] Patient not taking: Reported on 07/19/2018 05/08/18   Domenic Polite, MD  folic acid (FOLVITE) 1 MG tablet Take 1 tablet (1 mg total) by mouth daily. Patient not taking: Reported on 05/15/2018 09/19/17   Raiford Noble Latif, DO  potassium chloride SA (K-DUR,KLOR-CON) 20 MEQ tablet Take 2 tablets (40 mEq total) by mouth daily. Patient not taking: Reported on 08/03/2018 07/21/18   Reyne Dumas, MD  sodium chloride (OCEAN) 0.65 %  SOLN nasal spray Place 1 spray into both nostrils as needed for congestion. Patient not taking: Reported on 05/15/2018 04/26/18   Domenic Polite, MD    Family History Family History  Problem Relation Age of Onset  . Stroke Mother   . Rheum arthritis Mother   . Heart disease Father   . Colon cancer Unknown        ? mat. uncle died age 65  . Colon polyps Neg Hx   . Esophageal cancer Neg Hx   . Rectal cancer Neg Hx   . Stomach cancer Neg Hx     Social History Social History   Tobacco Use  . Smoking status: Current Every [place] Smoker    Packs/[place]: 1.50    Years: 58.00    Pack years: 87.00    Types: Cigarettes  . Smokeless tobacco: Never Used  Substance Use Topics  . Alcohol use: Yes    Alcohol/week: 14.0 standard drinks    Types: 14 Standard drinks or equivalent per week    Comment: every [place], vodka- 1-2 a [place]   . Drug use: No     Allergies   Iodinated diagnostic agents and Macrodantin [nitrofurantoin]   Review of Systems Review of Systems  Neurological: Positive for syncope.  All other systems reviewed and are negative.    Physical Exam Updated Vital Signs BP (!) 168/92   Pulse 72   Temp 97.7 F (36.5 C) (Oral)   Resp 17   Ht 5\' 5"  (1.651 m)   Wt 54.4 kg   SpO2 96%   BMI 19.97 kg/m   Physical Exam  Constitutional: He is oriented to person, place, and time. He appears well-developed and well-nourished. No distress.  HENT:  Head: Normocephalic and atraumatic.  Right Ear: Hearing normal.  Left Ear: Hearing normal.  Nose: Nose normal.  Mouth/Throat: Oropharynx is clear and moist and mucous membranes are normal.  Eyes: Pupils are equal, round, and reactive to light. Conjunctivae and EOM are normal.  Neck: Normal range of motion. Neck supple.  Cardiovascular: Regular rhythm, S1 normal and S2 normal. Exam reveals no gallop and no friction rub.  No murmur heard. Pulmonary/Chest: Effort normal and breath sounds normal. No respiratory distress. He exhibits no  tenderness.  Abdominal: Soft. Normal appearance and bowel sounds are normal. There is no hepatosplenomegaly. There is no tenderness. There is no rebound, no guarding, no tenderness at McBurney's point and negative Murphy's sign. No hernia.  Musculoskeletal: Normal range of motion.  Neurological: He is alert and oriented to person, place, and time. He has normal strength. No cranial nerve deficit or sensory deficit. Coordination normal. GCS eye subscore is 4. GCS verbal subscore is 5. GCS motor subscore is 6.  Skin: Skin is warm, dry and intact. No rash noted. No cyanosis.  Psychiatric: He has a normal mood and affect. His speech is normal and behavior is normal. Thought content normal.  Nursing note and vitals reviewed.    ED Treatments / Results  Labs (all labs ordered are listed, but only abnormal results are displayed) Labs Reviewed  CBC WITH DIFFERENTIAL/PLATELET - Abnormal; Notable for the following components:      Result Value   RBC 3.13 (*)    Hemoglobin 9.9 (*)    HCT 31.7 (*)    MCV 101.3 (*)    Platelets 425 (*)    All other components within normal limits  COMPREHENSIVE METABOLIC PANEL - Abnormal; Notable for the following components:   Sodium 132 (*)    Calcium 8.0 (*)    Total Protein 6.1 (*)    Albumin 2.7 (*)    All other components within normal limits  ETHANOL - Abnormal; Notable for  the following components:   Alcohol, Ethyl (B) 210 (*)    All other components within normal limits  TROPONIN I - Abnormal; Notable for the following components:   Troponin I 0.03 (*)    All other components within normal limits  URINALYSIS, ROUTINE W REFLEX MICROSCOPIC  TROPONIN I    EKG EKG Interpretation  Date/Time:  Sunday August 02 2018 23:37:43 EST Ventricular Rate:  70 PR Interval:    QRS Duration: 88 QT Interval:  436 QTC Calculation: 471 R Axis:   45 Text Interpretation:  Sinus rhythm Short PR interval Borderline low voltage, extremity leads No significant  change since last tracing Confirmed by Orpah Greek [phone]) on 08/03/2018 1:33:48 AM   Radiology Ct Head Wo Contrast  Result Date: 08/03/2018 CLINICAL DATA:  Fall.  Head trauma. EXAM: CT HEAD WITHOUT CONTRAST TECHNIQUE: Contiguous axial images were obtained from the base of the skull through the vertex without intravenous contrast. COMPARISON:  07/19/2018 FINDINGS: Brain: There is atrophy and chronic small vessel disease changes. No acute intracranial abnormality. Specifically, no hemorrhage, hydrocephalus, mass lesion, acute infarction, or significant intracranial injury. Vascular: No hyperdense vessel or unexpected calcification. Skull: No acute calvarial abnormality. Sinuses/Orbits: Visualized paranasal sinuses and mastoids clear. Orbital soft tissues unremarkable. Other: None IMPRESSION: No acute intracranial abnormality. Atrophy, chronic microvascular disease. Electronically Signed   By: Rolm Baptise M.D.   On: 08/03/2018 00:21    Procedures Procedures (including critical care time)  Medications Ordered in ED Medications  sodium chloride 0.9 % bolus 500 mL (0 mLs Intravenous Stopped 08/03/18 0120)     Initial Impression / Assessment and Plan / ED Course  I have reviewed the triage vital signs and the nursing notes.  Pertinent labs & imaging results that were available during my care of the patient were reviewed by me and considered in my medical decision making (see chart for details).     Patient presents to the emergency department for evaluation after a fall.  Patient has a long history of orthostatic hypotension, syncope as well as alcohol abuse.  Patient appears well on arrival.  Vital signs are normal other than his normal hypertension.  Lab work is unrevealing.  He did not endorse any chest pain, first troponin was 0.03 which is borderline elevated.  A second troponin IV hours later was within normal limits, EKG does not suggest ischemia or infarct.  Patient underwent  head CT because he does take Plavix.  No evidence of injury noted.  Patient appears to be at his normal baseline at this time, will be appropriate for discharge and outpatient management by primary care physician.  Final Clinical Impressions(s) / ED Diagnoses   Final diagnoses:  Orthostatic hypotension    ED Discharge Orders    None       Brookelyn Gaynor, Gwenyth Allegra, MD 08/03/18 [phone]

## 2018-08-02 NOTE — ED Triage Notes (Signed)
Brought by ems from home after falling down tonight.  Hx of fall recently with right shoulder fracture.  Denies any LOC.  Orthostatic at the scene.  CBG-126.  Patient reports falling backwards and unsure if he hit his head.  ETOH on board.  On plavix.

## 2018-08-03 ENCOUNTER — Emergency Department (HOSPITAL_COMMUNITY): Payer: Medicare Other

## 2018-08-03 LAB — URINALYSIS, ROUTINE W REFLEX MICROSCOPIC
Bilirubin Urine: NEGATIVE
GLUCOSE, UA: NEGATIVE mg/dL
HGB URINE DIPSTICK: NEGATIVE
Ketones, ur: NEGATIVE mg/dL
LEUKOCYTES UA: NEGATIVE
Nitrite: NEGATIVE
PH: 5 (ref 5.0–8.0)
PROTEIN: NEGATIVE mg/dL
SPECIFIC GRAVITY, URINE: 1.011 (ref 1.005–1.030)

## 2018-08-03 LAB — COMPREHENSIVE METABOLIC PANEL
ALBUMIN: 2.7 g/dL — AB (ref 3.5–5.0)
ALT: 16 U/L (ref 0–44)
ANION GAP: 11 (ref 5–15)
AST: 28 U/L (ref 15–41)
Alkaline Phosphatase: 104 U/L (ref 38–126)
BILIRUBIN TOTAL: 0.6 mg/dL (ref 0.3–1.2)
BUN: 15 mg/dL (ref 8–23)
CHLORIDE: 98 mmol/L (ref 98–111)
CO2: 23 mmol/L (ref 22–32)
Calcium: 8 mg/dL — ABNORMAL LOW (ref 8.9–10.3)
Creatinine, Ser: 1.09 mg/dL (ref 0.61–1.24)
GFR calc Af Amer: >60 mL/min (ref >60)
GFR calc non Af Amer: >60 mL/min (ref >60)
GLUCOSE: 88 mg/dL (ref 70–99)
POTASSIUM: 3.5 mmol/L (ref 3.5–5.1)
SODIUM: 132 mmol/L — AB (ref 135–145)
Total Protein: 6.1 g/dL — ABNORMAL LOW (ref 6.5–8.1)

## 2018-08-03 LAB — CBC WITH DIFFERENTIAL/PLATELET
Abs Immature Granulocytes: 0.02 10*3/uL (ref 0.00–0.07)
BASOS ABS: 0 10*3/uL (ref 0.0–0.1)
Basophils Relative: 1 %
EOS ABS: 0.2 10*3/uL (ref 0.0–0.5)
EOS PCT: 2 %
HEMATOCRIT: 31.7 % — AB (ref 39.0–52.0)
Hemoglobin: 9.9 g/dL — ABNORMAL LOW (ref 13.0–17.0)
IMMATURE GRANULOCYTES: 0 %
LYMPHS ABS: 3.3 10*3/uL (ref 0.7–4.0)
LYMPHS PCT: 49 %
MCH: 31.6 pg (ref 26.0–34.0)
MCHC: 31.2 g/dL (ref 30.0–36.0)
MCV: 101.3 fL — ABNORMAL HIGH (ref 80.0–100.0)
Monocytes Absolute: 0.5 10*3/uL (ref 0.1–1.0)
Monocytes Relative: 8 %
NRBC: 0 % (ref 0.0–0.2)
Neutro Abs: 2.7 10*3/uL (ref 1.7–7.7)
Neutrophils Relative %: 40 %
Platelets: 425 10*3/uL — ABNORMAL HIGH (ref 150–400)
RBC: 3.13 MIL/uL — ABNORMAL LOW (ref 4.22–5.81)
RDW: 14.2 % (ref 11.5–15.5)
WBC: 6.7 10*3/uL (ref 4.0–10.5)

## 2018-08-03 LAB — TROPONIN I
Troponin I: 0.03 ng/mL (ref <0.03)
Troponin I: <0.03 ng/mL (ref <0.03)

## 2018-08-03 LAB — ETHANOL: Alcohol, Ethyl (B): 210 mg/dL — ABNORMAL HIGH (ref <10)

## 2018-08-10 ENCOUNTER — Other Ambulatory Visit: Payer: Self-pay | Admitting: Cardiology

## 2018-08-10 NOTE — Telephone Encounter (Signed)
° ° ° °  1. Which medications need to be refilled? (please list name of each medication and dose if known) Valsartan 160mg  tab once daily  2. Which pharmacy/location (including street and city if local pharmacy) is medication to be sent to?[facility] [address] [facility]  3. Do they need a [address] or [address] supply? [place]

## 2018-08-11 ENCOUNTER — Ambulatory Visit: Payer: Medicare Other | Admitting: Cardiology

## 2018-08-11 MED ORDER — VALSARTAN 160 MG PO TABS: 160.0000 mg | ORAL_TABLET | Freq: Every day | ORAL | 0 refills | Status: DC

## 2018-08-11 NOTE — Telephone Encounter (Signed)
Contacted patient to verify that he is taking valsartan 160 mg daily and patient confirmed this. Patient has been scheduled for a follow up with Dr. Bettina Gavia on Monday, 08/31/18 at 11:20 in the [facility] office and a [address] supply of valsartan was sent to the pharmacy as requested.

## 2018-08-30 NOTE — Progress Notes (Deleted)
Cardiology Office Note:    Date:  08/30/2018   ID:  Brad Turner, DOB 1941/08/07, MRN [id number]  PCP:  Haywood Pao, MD  Cardiologist:  Shirlee More, MD    Referring MD: Haywood Pao, MD    ASSESSMENT:    No diagnosis found. PLAN:    In order of problems listed above:  1. ***   Next appointment: ***   Medication Adjustments/Labs and Tests Ordered: Current medicines are reviewed at length with the patient today.  Concerns regarding medicines are outlined above.  No orders of the defined types were placed in this encounter.  No orders of the defined types were placed in this encounter.   No chief complaint on file.   History of Present Illness:    Brad Turner is a 77 y.o. male with a hx of *** last seen by Dr. Marlou Porch 09/18/2017 at discharge from [facility]..  At that time he had sustained tachycardia suitable cardiomyopathy.  Coronary angiography 09/15/2017 showed a ejection fraction 35 to 40% and mild to moderate coronary stenosis worst in the marginal branches at 60% M1 and 65% M2.  He also 25% proximal LAD stenosis.  He had a recent fall with fracture of the right humerus discharged 07/22/2018. Compliance with diet, lifestyle and medications: *** Past Medical History:  Diagnosis Date  . Alcohol withdrawal ([place])   . Allergy    seasonal  . Arthritis   . Barrett esophagus   . BPH (benign prostatic hyperplasia)   . CAD (coronary artery disease)    a. s/p multiple POBA last in 2011 to distal OM2. b. Takotsubo event 09/2017 with cath showing no culprit, 25% prox LAD, 60% OM1, 65% OM2, EF 35-40% by cath and 30-35% by echo.  . Chronic back pain   . COPD (chronic obstructive pulmonary disease) (H[facility])    Albuterol inhaler as needed.Uses Symbicort daily  . Depression   . Emphysema lung ([place])   . Fall    fell on 10/06/16 with loss of consiousness for a short time.States he remembers getting up but remembers nothing else.  . Gastric ulcer   . Gastritis   .  GERD (gastroesophageal reflux disease)    takes Omeprazole daily  . History of colon polyps    benign  . History of kidney stones   . Hyperlipidemia   . Hypertension   . Hyponatremia   . 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 Neuropathy   . Nocturia   . Pneumonia    hx of  . PONV (postoperative nausea and vomiting)   . Skin cancer   . Stroke [facility]) 1995   Affected balance; and has very emotional if watching a movie  . Syncope    yr ago  . Takotsubo cardiomyopathy    a. 09/2017 - EF 35-40% by cath, 30-35% by echo.  . Urgency of urination    takes Myrbetriq daily    Past Surgical History:  Procedure Laterality Date  . APPENDECTOMY    . CARDIAC CATHETERIZATION     x 4. Most recent one in 2011 at [facility](3 previous were done 20 yrs before)  . cataracts Bilateral   . CHEST EXPLORATION Left 11/24/2017   Procedure: CHEST EXPLORATION;  Surgeon: [name], MD;  Location: [place];  Service: Thoracic;  Laterality: Left;  . COLONOSCOPY    . CYSTOSCOPY  07/21/2013   Dr. Jeffie Pollock  . DENTAL SURGERY  with sedation  . ESOPHAGEAL MANOMETRY N/A 09/20/2015   Procedure: ESOPHAGEAL MANOMETRY (EM);  Surgeon: Manus Gunning, MD;  Location: [facility];  Service: Gastroenterology;  Laterality: N/A;  . LEFT HEART CATH AND CORONARY ANGIOGRAPHY N/A 09/15/2017   Procedure: LEFT HEART CATH AND CORONARY ANGIOGRAPHY;  Surgeon: Martinique, Peter M, MD;  Location: [place] [facility];  Service: Cardiovascular;  Laterality: N/A;  . LUMBAR LAMINECTOMY/DECOMPRESSION MICRODISCECTOMY N/A 01/12/2015   Procedure: LUMBAR LAMINECTOMY/DECOMPRESSION MICRODISCECTOMY 3 LEVELS;  Surgeon: Phylliss Bob, MD;  Location: [place];  Service: Orthopedics;  Laterality: N/A;  Lumbar 3-4, lumbar 4-5, lumbar 5-sacrum 1 decompression  . parathyroid adenoma removed    . POLYPECTOMY    . ROTATOR CUFF REPAIR     bilateral  . TONSILLECTOMY    . UPPER GASTROINTESTINAL ENDOSCOPY    . VIDEO  ASSISTED THORACOSCOPY (VATS)/EMPYEMA Left 11/24/2017   Procedure: VIDEO ASSISTED THORACOSCOPY (VATS)/EMPYEMA;  Surgeon: [name], MD;  Location: [place];  Service: Thoracic;  Laterality: Left;  Marland Kitchen VIDEO BRONCHOSCOPY N/A 11/24/2017   Procedure: VIDEO BRONCHOSCOPY;  Surgeon: [name], MD;  Location: [facility] OR;  Service: Thoracic;  Laterality: N/A;    Current Medications: No outpatient medications have been marked as taking for the 08/31/18 encounter (Appointment) with Richardo Priest, MD.     Allergies:   Iodinated diagnostic agents and Macrodantin [nitrofurantoin]   Social History   Socioeconomic History  . Marital status: Single    Spouse name: Not on file  . Number of children: 1  . Years of education: Not on file  . Highest education level: Not on file  Occupational History  . Occupation: Network engineer OF [employer]    Employer: [employer]  Social Needs  . Financial resource strain: Not on file  . Food insecurity:    Worry: Patient refused    Inability: Patient refused  . Transportation needs:    Medical: Patient refused    Non-medical: Patient refused  Tobacco Use  . Smoking status: Current Every Cope Smoker    Packs/Kusek: 1.50    Years: 58.00    Pack years: 87.00    Types: Cigarettes  . Smokeless tobacco: Never Used  Substance and Sexual Activity  . Alcohol use: Yes    Alcohol/week: 14.0 standard drinks    Types: 14 Standard drinks or equivalent per week    Comment: every Braley, vodka- 1-2 a Deetz   . Drug use: No  . Sexual activity: Not on file  Lifestyle  . Physical activity:    Days per week: Patient refused    Minutes per session: Patient refused  . Stress: Very much  Relationships  . Social connections:    Talks on phone: Three times a week    Gets together: Never    Attends religious service: Never    Active member of club or organization: Patient refused    Attends meetings of clubs or organizations: Patient refused    Relationship status: Patient  refused  Other Topics Concern  . Not on file  Social History Narrative  . Not on file     Family History: The patient's ***family history includes Colon cancer in his unknown relative; Heart disease in his father; Rheum arthritis in his mother; Stroke in his mother. There is no history of Colon polyps, Esophageal cancer, Rectal cancer, or Stomach cancer. ROS:   Please see the history of present illness.    All other systems reviewed and are negative.  EKGs/Labs/Other Studies Reviewed:  The following studies were reviewed today:  EKG:  EKG ordered today.  The ekg ordered today demonstrates ***  Recent Labs: 01/02/2018: TSH 1.46 07/19/2018: B Natriuretic Peptide 266.5 07/22/2018: Magnesium 1.7 08/03/2018: ALT 16; BUN 15; Creatinine, Ser 1.09; Hemoglobin 9.9; Platelets 425; Potassium 3.5; Sodium 132  Recent Lipid Panel    Component Value Date/Time   CHOL 131 09/14/2017 2354   TRIG 76 11/24/2017 2145   HDL 84 09/14/2017 2354   CHOLHDL 1.6 09/14/2017 2354   VLDL 9 09/14/2017 2354   LDLCALC 38 09/14/2017 2354    Physical Exam:    VS:  There were no vitals taken for this visit.    Wt Readings from Last 3 Encounters:  08/02/18 120 lb (54.4 kg)  04/25/18 120 lb (54.4 kg)  04/15/18 113 lb (51.3 kg)     GEN: *** Well nourished, well developed in no acute distress HEENT: Normal NECK: No JVD; No carotid bruits LYMPHATICS: No lymphadenopathy CARDIAC: ***RRR, no murmurs, rubs, gallops RESPIRATORY:  Clear to auscultation without rales, wheezing or rhonchi  ABDOMEN: Soft, non-tender, non-distended MUSCULOSKELETAL:  No edema; No deformity  SKIN: Warm and dry NEUROLOGIC:  Alert and oriented x 3 PSYCHIATRIC:  Normal affect    Signed, Shirlee More, MD  08/30/2018 2:55 PM    [place]

## 2018-08-31 ENCOUNTER — Ambulatory Visit: Payer: Medicare Other | Admitting: Cardiology

## 2018-09-09 ENCOUNTER — Emergency Department (HOSPITAL_COMMUNITY): Payer: Medicare Other

## 2018-09-09 ENCOUNTER — Encounter (HOSPITAL_COMMUNITY): Payer: Self-pay

## 2018-09-09 ENCOUNTER — Emergency Department (HOSPITAL_COMMUNITY)
Admission: EM | Admit: 2018-09-09 | Discharge: 2018-09-10 | Disposition: A | Discharge status: Home / Self Care | Payer: Medicare Other | Attending: Emergency Medicine | Admitting: Emergency Medicine

## 2018-09-09 ENCOUNTER — Other Ambulatory Visit: Payer: Self-pay

## 2018-09-09 DIAGNOSIS — Z79899 Other long term (current) drug therapy: Secondary | ICD-10-CM | POA: Diagnosis not present

## 2018-09-09 DIAGNOSIS — Y998 Other external cause status: Secondary | ICD-10-CM | POA: Diagnosis not present

## 2018-09-09 DIAGNOSIS — J449 Chronic obstructive pulmonary disease, unspecified: Secondary | ICD-10-CM | POA: Diagnosis not present

## 2018-09-09 DIAGNOSIS — S0121XA Laceration without foreign body of nose, initial encounter: Secondary | ICD-10-CM | POA: Insufficient documentation

## 2018-09-09 DIAGNOSIS — E785 Hyperlipidemia, unspecified: Secondary | ICD-10-CM | POA: Insufficient documentation

## 2018-09-09 DIAGNOSIS — E876 Hypokalemia: Secondary | ICD-10-CM | POA: Diagnosis not present

## 2018-09-09 DIAGNOSIS — I10 Essential (primary) hypertension: Secondary | ICD-10-CM | POA: Insufficient documentation

## 2018-09-09 DIAGNOSIS — I2510 Atherosclerotic heart disease of native coronary artery without angina pectoris: Secondary | ICD-10-CM | POA: Insufficient documentation

## 2018-09-09 DIAGNOSIS — S0181XA Laceration without foreign body of other part of head, initial encounter: Secondary | ICD-10-CM | POA: Diagnosis not present

## 2018-09-09 DIAGNOSIS — S0990XA Unspecified injury of head, initial encounter: Secondary | ICD-10-CM | POA: Diagnosis not present

## 2018-09-09 DIAGNOSIS — W010XXA Fall on same level from slipping, tripping and stumbling without subsequent striking against object, initial encounter: Secondary | ICD-10-CM | POA: Diagnosis not present

## 2018-09-09 DIAGNOSIS — W19XXXA Unspecified fall, initial encounter: Secondary | ICD-10-CM

## 2018-09-09 DIAGNOSIS — Y92009 Unspecified place in unspecified non-institutional (private) residence as the place of occurrence of the external cause: Secondary | ICD-10-CM | POA: Diagnosis not present

## 2018-09-09 DIAGNOSIS — Y9389 Activity, other specified: Secondary | ICD-10-CM | POA: Insufficient documentation

## 2018-09-09 DIAGNOSIS — Z8673 Personal history of transient ischemic attack (TIA), and cerebral infarction without residual deficits: Secondary | ICD-10-CM | POA: Insufficient documentation

## 2018-09-09 DIAGNOSIS — F17210 Nicotine dependence, cigarettes, uncomplicated: Secondary | ICD-10-CM | POA: Diagnosis not present

## 2018-09-09 LAB — CBC WITH DIFFERENTIAL/PLATELET
ABS IMMATURE GRANULOCYTES: 0.04 10*3/uL (ref 0.00–0.07)
Basophils Absolute: 0 10*3/uL (ref 0.0–0.1)
Basophils Relative: 0 %
Eosinophils Absolute: 0.1 10*3/uL (ref 0.0–0.5)
Eosinophils Relative: 1 %
HCT: 31.3 % — ABNORMAL LOW (ref 39.0–52.0)
Hemoglobin: 10 g/dL — ABNORMAL LOW (ref 13.0–17.0)
IMMATURE GRANULOCYTES: 1 %
Lymphocytes Relative: 28 %
Lymphs Abs: 2.1 10*3/uL (ref 0.7–4.0)
MCH: 33.3 pg (ref 26.0–34.0)
MCHC: 31.9 g/dL (ref 30.0–36.0)
MCV: 104.3 fL — AB (ref 80.0–100.0)
MONOS PCT: 10 %
Monocytes Absolute: 0.8 10*3/uL (ref 0.1–1.0)
NEUTROS PCT: 60 %
Neutro Abs: 4.5 10*3/uL (ref 1.7–7.7)
Platelets: 352 10*3/uL (ref 150–400)
RBC: 3 MIL/uL — ABNORMAL LOW (ref 4.22–5.81)
RDW: 19.2 % — ABNORMAL HIGH (ref 11.5–15.5)
WBC: 7.6 10*3/uL (ref 4.0–10.5)
nRBC: 0 % (ref 0.0–0.2)

## 2018-09-09 LAB — BASIC METABOLIC PANEL
Anion gap: 17 — ABNORMAL HIGH (ref 5–15)
BUN: 17 mg/dL (ref 8–23)
CO2: 23 mmol/L (ref 22–32)
Calcium: 7.7 mg/dL — ABNORMAL LOW (ref 8.9–10.3)
Chloride: 95 mmol/L — ABNORMAL LOW (ref 98–111)
Creatinine, Ser: 1.42 mg/dL — ABNORMAL HIGH (ref 0.61–1.24)
GFR calc Af Amer: 56 mL/min — ABNORMAL LOW (ref >60)
GFR calc non Af Amer: 48 mL/min — ABNORMAL LOW (ref >60)
Glucose, Bld: 90 mg/dL (ref 70–99)
POTASSIUM: 2.5 mmol/L — AB (ref 3.5–5.1)
Sodium: 135 mmol/L (ref 135–145)

## 2018-09-09 LAB — CK: Total CK: 79 U/L (ref 49–397)

## 2018-09-09 LAB — I-STAT TROPONIN, ED: Troponin i, poc: 0.01 ng/mL (ref 0.00–0.08)

## 2018-09-09 LAB — ETHANOL: Alcohol, Ethyl (B): 50 mg/dL — ABNORMAL HIGH (ref <10)

## 2018-09-09 MED ORDER — POTASSIUM CHLORIDE 10 MEQ/100ML IV SOLN
10.0000 meq | Freq: Once | INTRAVENOUS | Status: AC
  Administered 2018-09-10: 10 meq via INTRAVENOUS
  Filled 2018-09-09: qty 100

## 2018-09-09 MED ORDER — SODIUM CHLORIDE 0.9 % IV BOLUS
500.0000 mL | Freq: Once | INTRAVENOUS | Status: AC
  Administered 2018-09-10: 500 mL via INTRAVENOUS

## 2018-09-09 MED ORDER — POTASSIUM CHLORIDE CRYS ER 20 MEQ PO TBCR
40.0000 meq | EXTENDED_RELEASE_TABLET | Freq: Once | ORAL | Status: AC
  Administered 2018-09-10: 40 meq via ORAL
  Filled 2018-09-09: qty 2

## 2018-09-09 NOTE — ED Provider Notes (Signed)
[facility] EMERGENCY DEPARTMENT Provider Note   CSN: [id number] Arrival date & time: 09/09/18  2136     History   Chief Complaint Chief Complaint  Patient presents with  . Fall    HPI Brad Turner is a 78 y.o. male.  The history is provided by the patient and medical records.     78 y.o. M with hx of BPH, CAD, chronic back pain, COPD, depression, gastritis, GERD, HTN, neuropathy, syncope, prior stroke, alcohol abuse, presenting to the ED for a fall.  This is actually patient's second fall today-- fell earlier this morning, son came to help him up.  States he was on the couch and was trying to go to the kitchen to get a snack and reports when he stood up he felt lightheaded, lost his balance, and fell.  He did strike his head on the floor, denies LOC.  He was not able to get up, unsure how long he was in the floor.  On EMS arrival, he was found lying in the floor in a puddle of his own blood.  It was noted there were several empty bottles of vodka scattered throughout the house.  Patient admits to EtOH today, cannot quantify how much.  He reports some pain in his head, has laceration on forehead.  He also has a skin tear to left elbow and reports pain in right hip, although this is not a new pain, but feels worse after fall now.  He does take plavix.  No intervention tried PTA.  Tetanus UTD (last dose 2016 per char review).  Past Medical History:  Diagnosis Date  . Alcohol withdrawal ([place])   . Allergy    seasonal  . Arthritis   . Barrett esophagus   . BPH (benign prostatic hyperplasia)   . CAD (coronary artery disease)    a. s/p multiple POBA last in 2011 to distal OM2. b. Takotsubo event 09/2017 with cath showing no culprit, 25% prox LAD, 60% OM1, 65% OM2, EF 35-40% by cath and 30-35% by echo.  . Chronic back pain   . COPD (chronic obstructive pulmonary disease) (HCC)    Albuterol inhaler as needed.Uses Symbicort daily  . Depression   . Emphysema lung ([place])     . Fall    fell on 10/06/16 with loss of consiousness for a short time.States he remembers getting up but remembers nothing else.  . Gastric ulcer   . Gastritis   . GERD (gastroesophageal reflux disease)    takes Omeprazole daily  . History of colon polyps    benign  . History of kidney stones   . Hyperlipidemia   . Hypertension   . Hyponatremia   . 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 Neuropathy   . Nocturia   . Pneumonia    hx of  . PONV (postoperative nausea and vomiting)   . Skin cancer   . Stroke [facility]) 1995   Affected balance; and has very emotional if watching a movie  . Syncope    yr ago  . Takotsubo cardiomyopathy    a. 09/2017 - EF 35-40% by cath, 30-35% by echo.  . Urgency of urination    takes Myrbetriq daily    Patient Active Problem List   Diagnosis Date Noted  . Nail deformity 05/08/2018  . Fall 04/26/2018  . Nasal bone fracture 04/26/2018  . Maxillary sinus fracture ([place]) 04/26/2018  . Multiple  skin tears   . Alcoholic intoxication without complication ([place])   . Liver mass 01/01/2018  . Adjustment disorder with depressed mood 12/29/2017  . Neurocognitive disorder 12/21/2017  . Adult failure to thrive 12/09/2017  . Pressure injury of skin 12/01/2017  . Empyema ([place]) 11/24/2017  . Iron deficiency anemia 11/22/2017  . Acute urinary retention 11/22/2017  . Hypokalemia   . Hypomagnesemia   . Parapneumonic effusion   . Sepsis due to pneumonia ([place])   . ETOH abuse 11/21/2017  . Acute respiratory failure ([place]) 11/21/2017  . HCAP (healthcare-associated pneumonia) 11/21/2017  . Sepsis ([place]) 11/21/2017  . Pleural effusion 11/21/2017  . Acute kidney injury ([place]) 11/21/2017  . Abdominal pain 11/21/2017  . Malnutrition of moderate degree 09/24/2017  . Acute delirium 09/16/2017  . Acute hyponatremia 09/16/2017  . Alcohol withdrawal ([place]) 09/16/2017  . 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  09/16/2017  . Tobacco abuse 09/16/2017  . Depression 09/16/2017  . History of CVA (cerebrovascular accident) 09/16/2017  . History of Barrett's esophagus 09/16/2017  . Takotsubo cardiomyopathy 09/15/2017  . Chronic back pain 12/01/2016  . S/P lumbar spinal fusion 12/01/2016  . Dysthymia 12/01/2016  . Radiculopathy 10/17/2016  . Abnormal CT of the chest 06/10/2016  . Dysphagia   . Hypertensive heart disease 10/30/2014    Class: Chronic  . Syncope 10/29/2014  . Cough syncope 10/07/2014  . COPD with acute exacerbation ([place]) 09/29/2014  . Pulmonary nodules 11/22/2013  . Personal history of colonic polyps 06/04/2010  . COPD (chronic obstructive pulmonary disease) with emphysema ([place]) 02/26/2010  . CHEST PAIN, ATYPICAL 02/26/2010  . BARRETTS ESOPHAGUS 05/18/2009  . HLD (hyperlipidemia) 04/28/2008  . Coronary artery disease involving native coronary artery of native heart with angina pectoris ([place]) 04/28/2008  . CVA 04/28/2008  . GERD 04/28/2008  . ARTHRITIS 04/28/2008  . NEPHROLITHIASIS, HX OF 04/28/2008    Past Surgical History:  Procedure Laterality Date  . APPENDECTOMY    . CARDIAC CATHETERIZATION     x 4. Most recent one in 2011 at [facility](3 previous were done 20 yrs before)  . cataracts Bilateral   . CHEST EXPLORATION Left 11/24/2017   Procedure: CHEST EXPLORATION;  Surgeon: [name], MD;  Location: [place];  Service: Thoracic;  Laterality: Left;  . COLONOSCOPY    . CYSTOSCOPY  07/21/2013   Dr. Jeffie Pollock  . DENTAL SURGERY     with sedation  . ESOPHAGEAL MANOMETRY N/A 09/20/2015   Procedure: ESOPHAGEAL MANOMETRY (EM);  Surgeon: Manus Gunning, MD;  Location: [facility];  Service: Gastroenterology;  Laterality: N/A;  . LEFT HEART CATH AND CORONARY ANGIOGRAPHY N/A 09/15/2017   Procedure: LEFT HEART CATH AND CORONARY ANGIOGRAPHY;  Surgeon: Martinique, Peter M, MD;  Location: [place] [facility];  Service: Cardiovascular;  Laterality: N/A;  . LUMBAR  LAMINECTOMY/DECOMPRESSION MICRODISCECTOMY N/A 01/12/2015   Procedure: LUMBAR LAMINECTOMY/DECOMPRESSION MICRODISCECTOMY 3 LEVELS;  Surgeon: Phylliss Bob, MD;  Location: [place];  Service: Orthopedics;  Laterality: N/A;  Lumbar 3-4, lumbar 4-5, lumbar 5-sacrum 1 decompression  . parathyroid adenoma removed    . POLYPECTOMY    . ROTATOR CUFF REPAIR     bilateral  . TONSILLECTOMY    . UPPER GASTROINTESTINAL ENDOSCOPY    . VIDEO ASSISTED THORACOSCOPY (VATS)/EMPYEMA Left 11/24/2017   Procedure: VIDEO ASSISTED THORACOSCOPY (VATS)/EMPYEMA;  Surgeon: [name], MD;  Location: [place];  Service: Thoracic;  Laterality: Left;  Marland Kitchen VIDEO BRONCHOSCOPY N/A 11/24/2017   Procedure: VIDEO BRONCHOSCOPY;  Surgeon: Lanelle Bal  B, MD;  Location: [place];  Service: Thoracic;  Laterality: N/A;        Home Medications    Prior to Admission medications   Medication Sig Start Date End Date Taking? Authorizing Provider  acetaminophen (TYLENOL) 650 MG CR tablet Take 650 mg by mouth every 8 (eight) hours as needed for pain.     [provider]  albuterol (PROVENTIL HFA;VENTOLIN HFA) 108 (90 BASE) MCG/ACT inhaler Inhale 2 puffs into the lungs every 6 (six) hours as needed for wheezing or shortness of breath. Reported on 11/29/2015    [provider]  amLODipine (NORVASC) 5 MG tablet Take 1 tablet (5 mg total) by mouth daily. 07/22/18   Rai, Ripudeep K, MD  arformoterol (BROVANA) 15 MCG/2ML NEBU Take 2 mLs (15 mcg total) by nebulization 2 (two) times daily. Patient not taking: Reported on 07/19/2018 02/09/18   Noralee Space, MD  atorvastatin (LIPITOR) 80 MG tablet Take 80 mg by mouth daily.     [provider]  clopidogrel (PLAVIX) 75 MG tablet Take 1 tablet (75 mg total) by mouth daily. Restart in 7-10days after follow up with ENT Dr,[place] Patient not taking: Reported on 07/19/2018 05/08/18   Domenic Polite, MD  DULoxetine (CYMBALTA) 60 MG capsule Take 60 mg by mouth daily.     [provider]  ENSURE (ENSURE) Take 237 mLs by mouth 2 (two) times daily between meals.    [provider]  escitalopram (LEXAPRO) 10 MG tablet Take 10 mg by mouth daily. 04/08/18   [provider]  folic acid (FOLVITE) 1 MG tablet Take 1 tablet (1 mg total) by mouth daily. Patient not taking: Reported on 05/15/2018 09/19/17   Raiford Noble Latif, DO  folic acid (FOLVITE) 664 MCG tablet Take 400 mcg by mouth daily.    [provider]  Magnesium Oxide 400 MG CAPS Take 1 capsule (400 mg total) by mouth 2 (two) times daily. 1 tablet twice a [place] 07/22/18   Rai, Vernelle Emerald, MD  metoprolol succinate (TOPROL-XL) 50 MG 24 hr tablet Take 1 tablet (50 mg total) by mouth daily. Take with or immediately following a meal. 09/19/17   Duke, Tami Lin, PA  MYRBETRIQ 50 MG TB24 tablet Take 50 mg by mouth daily.  05/03/15   [provider]  nicotine (NICODERM CQ - DOSED IN MG/24 HOURS) 21 mg/24hr patch Place 1 patch (21 mg total) onto the skin daily. 07/22/18   Rai, Ripudeep K, MD  NITROSTAT 0.4 MG SL tablet Place 0.4 mg under the tongue every 5 (five) minutes as needed for chest pain. Reported on 11/29/2015 05/29/13   [provider]  omeprazole (PRILOSEC) 40 MG capsule TAKE ONE CAPSULE BY MOUTH TWICE DAILY Patient taking differently: Take 40 mg by mouth 2 (two) times daily.  04/22/16   Armbruster, Carlota Raspberry, MD  oxyCODONE-acetaminophen (PERCOCET/ROXICET) 5-325 MG tablet Take 1 tablet by mouth every 4 (four) hours as needed for moderate pain. 07/20/18   Reyne Dumas, MD  potassium chloride SA (K-DUR,KLOR-CON) 20 MEQ tablet Take 2 tablets (40 mEq total) by mouth daily. Patient not taking: Reported on 08/03/2018 07/21/18   Reyne Dumas, MD  potassium chloride SA (K-DUR,KLOR-CON) 20 MEQ tablet Take 2 tablets (40 mEq total) by mouth daily. 07/21/18   Reyne Dumas, MD  sodium chloride (OCEAN) 0.65 % SOLN nasal spray Place 1 spray into both nostrils as needed for  congestion. Patient not taking: Reported on 05/15/2018 04/26/18   Domenic Polite, MD  SYMBICORT 160-4.5 MCG/ACT inhaler INHALE TWO PUFFS INTO LUNGS TWICE DAILY Patient taking differently: Inhale 2 puffs into the lungs 2 (two) times daily.  04/19/14   Clance, Armando Reichert, MD  thiamine 100 MG tablet Take 1 tablet (100 mg total) by mouth daily. 07/22/18   Rai, Vernelle Emerald, MD  Tiotropium Bromide Monohydrate (SPIRIVA RESPIMAT) 2.5 MCG/ACT AERS Inhale 2 puffs into the lungs daily.     [provider]  valsartan (DIOVAN) 160 MG tablet Take 1 tablet (160 mg total) by mouth daily. 08/11/18   Richardo Priest, MD    Family History Family History  Problem Relation Age of Onset  . Stroke Mother   . Rheum arthritis Mother   . Heart disease Father   . Colon cancer Unknown        ? mat. uncle died age 32  . Colon polyps Neg Hx   . Esophageal cancer Neg Hx   . Rectal cancer Neg Hx   . Stomach cancer Neg Hx     Social History Social History   Tobacco Use  . Smoking status: Current Every [place] Smoker    Packs/Mckain: 1.50    Years: 58.00    Pack years: 87.00    Types: Cigarettes  . Smokeless tobacco: Never Used  Substance Use Topics  . Alcohol use: Yes    Alcohol/week: 14.0 standard drinks    Types: 14 Standard drinks or equivalent per week    Comment: every [place], vodka- 1-2 a [place]   . Drug use: No     Allergies   Iodinated diagnostic agents and Macrodantin [nitrofurantoin]   Review of Systems Review of Systems  Musculoskeletal: Positive for arthralgias.  Skin: Positive for wound.  All other systems reviewed and are negative.    Physical Exam Updated Vital Signs BP 123/73 (BP Location: Left Arm)   Pulse 66   Temp 98.2 F (36.8 C) (Oral)   Resp 18   Ht 5\' 5"  (1.651 m)   Wt 55 kg   SpO2 98%   BMI 20.18 kg/m   Physical Exam Vitals signs and nursing note reviewed.  Constitutional:      Appearance: He is well-developed.  HENT:     Head: Normocephalic and atraumatic.      Comments: Laceration to forehead, approx 4cm, dried blood over the face and in mouth, dentures in place and appear intact    Nose: Nasal deformity and signs of injury present.     Comments: Nasal deformity present, small 1cm superficial laceration noted over bridge of nose Eyes:     Conjunctiva/sclera: Conjunctivae normal.     Pupils: Pupils are equal, round, and reactive to light.  Neck:     Musculoskeletal: Normal range of motion.  Cardiovascular:     Rate and Rhythm: Normal rate and regular rhythm.     Heart sounds: Normal heart sounds.  Pulmonary:     Effort: Pulmonary effort is normal.     Breath sounds: Normal breath sounds.  Abdominal:     General: Bowel sounds are normal.     Palpations: Abdomen is soft.  Musculoskeletal: Normal range of motion.     Comments: Skin tear left elbow, no active bleeding, no deformity Right elbow with bandage in place from prior fall Right hip with large bruise over the lateral/posterior hip that appears old and in various stages of healing, no apparent leg shortening  Skin:    General: Skin is warm and dry.  Neurological:     Mental  Status: He is alert and oriented to person, place, and time.     Comments: Awake, alert, oriented; moving extremities well without apparent deficit, able to answer questions and follow commands      ED Treatments / Results  Labs (all labs ordered are listed, but only abnormal results are displayed) Labs Reviewed  CBC WITH DIFFERENTIAL/PLATELET - Abnormal; Notable for the following components:      Result Value   RBC 3.00 (*)    Hemoglobin 10.0 (*)    HCT 31.3 (*)    MCV 104.3 (*)    RDW 19.2 (*)    All other components within normal limits  BASIC METABOLIC PANEL - Abnormal; Notable for the following components:   Potassium 2.5 (*)    Chloride 95 (*)    Creatinine, Ser 1.42 (*)    Calcium 7.7 (*)    GFR calc non Af Amer 48 (*)    GFR calc Af Amer 56 (*)    Anion gap 17 (*)    All other components  within normal limits  ETHANOL - Abnormal; Notable for the following components:   Alcohol, Ethyl (B) 50 (*)    All other components within normal limits  MAGNESIUM - Abnormal; Notable for the following components:   Magnesium 1.0 (*)    All other components within normal limits  I-STAT CHEM 8, ED - Abnormal; Notable for the following components:   Chloride 97 (*)    Creatinine, Ser 1.30 (*)    Glucose, Bld 106 (*)    Calcium, Ion 0.96 (*)    Hemoglobin 10.2 (*)    HCT 30.0 (*)    All other components within normal limits  CK  I-STAT TROPONIN, ED    EKG EKG Interpretation  Date/Time:  Thursday September 10 2018 01:09:27 EST Ventricular Rate:  78 PR Interval:  112 QRS Duration: 82 QT Interval:  442 QTC Calculation: 503 R Axis:   39 Text Interpretation:  Normal sinus rhythm Prolonged QT Abnormal ECG Since last EKG, QT is prolonged Confirmed by Duffy Bruce [phone]) on 09/10/2018 3:01:13 AM   Radiology Dg Elbow Complete Left  Result Date: 09/09/2018 CLINICAL DATA:  Fall with elbow pain EXAM: LEFT ELBOW - COMPLETE 3+ VIEW COMPARISON:  None. FINDINGS: There is no evidence of fracture, dislocation, or joint effusion. There is no evidence of arthropathy or other focal bone abnormality. Soft tissues are unremarkable. IMPRESSION: Negative. Electronically Signed   By: Donavan Foil M.D.   On: 09/09/2018 23:59   Ct Head Wo Contrast  Result Date: 09/10/2018 CLINICAL DATA:  Head trauma, minor, GCS>=13, high clinical risk, initial exam; Facial trauma, fx suspected fall, head trauma, nasal deformity. Patient reports fall backward 1 today. No loss of consciousness. EXAM: CT HEAD WITHOUT CONTRAST CT MAXILLOFACIAL WITHOUT CONTRAST CT CERVICAL SPINE WITHOUT CONTRAST TECHNIQUE: Multidetector CT imaging of the head, cervical spine, and maxillofacial structures were performed using the standard protocol without intravenous contrast. Multiplanar CT image reconstructions of the cervical spine and  maxillofacial structures were also generated. COMPARISON:  Head CT 08/03/2018. Head and cervical spine CT 07/19/2018. Face CT 05/15/2018 FINDINGS: CT HEAD FINDINGS Brain: Unchanged degree of atrophy and chronic small vessel ischemia. Remote lacunar infarct in left greater than right basal ganglia. No intracranial hemorrhage, mass effect, or midline shift. No hydrocephalus. The basilar cisterns are patent. No evidence of territorial infarct or acute ischemia. No extra-axial or intracranial fluid collection. Vascular: Atherosclerosis of skullbase vasculature without hyperdense vessel or abnormal calcification. Skull: No  fracture or focal lesion. Other: Right frontal scalp hematoma. CT MAXILLOFACIAL FINDINGS Osseous: Remote nasal bone fractures unchanged in alignment from prior exam. No acute nasal bone fracture. Zygomatic arches and mandibles are intact. Streak artifact from dental hardware partially obscures regional evaluation. The temporomandibular joints are congruent. Orbits: Both orbits and globes are intact. No orbital fracture. Bilateral cataract resection. Small right supraorbital scalp hematoma. Sinuses: No acute fracture or fluid level. Paranasal sinuses are clear. Soft tissues: Right supraorbital scalp hematoma. CT CERVICAL SPINE FINDINGS Alignment: Stable from prior exam without traumatic subluxation. Anterolisthesis of C3 on C4 with retrolisthesis of C4 on C5, stable and likely degenerative. Skull base and vertebrae: No acute fracture. The dens and skull base are intact. Template scleroses secondary degenerative change, stable. Soft tissues and spinal canal: No prevertebral fluid or swelling. No visible canal hematoma. Disc levels: Diffuse disc space narrowing and endplate spurring. Moderate facet arthropathy. Degenerative changes are stable from prior. Upper chest: Emphysema. No acute findings. Other: None. IMPRESSION: 1. No acute intracranial abnormality. No skull fracture. Right frontal scalp  hematoma. 2. No acute facial bone fracture. Remote nasal bone fractures. 3. Stable degenerative change in the cervical spine without acute fracture or subluxation. Electronically Signed   By: Keith Rake M.D.   On: 09/10/2018 00:14   Ct Cervical Spine Wo Contrast  Result Date: 09/10/2018 CLINICAL DATA:  Head trauma, minor, GCS>=13, high clinical risk, initial exam; Facial trauma, fx suspected fall, head trauma, nasal deformity. Patient reports fall backward 1 today. No loss of consciousness. EXAM: CT HEAD WITHOUT CONTRAST CT MAXILLOFACIAL WITHOUT CONTRAST CT CERVICAL SPINE WITHOUT CONTRAST TECHNIQUE: Multidetector CT imaging of the head, cervical spine, and maxillofacial structures were performed using the standard protocol without intravenous contrast. Multiplanar CT image reconstructions of the cervical spine and maxillofacial structures were also generated. COMPARISON:  Head CT 08/03/2018. Head and cervical spine CT 07/19/2018. Face CT 05/15/2018 FINDINGS: CT HEAD FINDINGS Brain: Unchanged degree of atrophy and chronic small vessel ischemia. Remote lacunar infarct in left greater than right basal ganglia. No intracranial hemorrhage, mass effect, or midline shift. No hydrocephalus. The basilar cisterns are patent. No evidence of territorial infarct or acute ischemia. No extra-axial or intracranial fluid collection. Vascular: Atherosclerosis of skullbase vasculature without hyperdense vessel or abnormal calcification. Skull: No fracture or focal lesion. Other: Right frontal scalp hematoma. CT MAXILLOFACIAL FINDINGS Osseous: Remote nasal bone fractures unchanged in alignment from prior exam. No acute nasal bone fracture. Zygomatic arches and mandibles are intact. Streak artifact from dental hardware partially obscures regional evaluation. The temporomandibular joints are congruent. Orbits: Both orbits and globes are intact. No orbital fracture. Bilateral cataract resection. Small right supraorbital scalp  hematoma. Sinuses: No acute fracture or fluid level. Paranasal sinuses are clear. Soft tissues: Right supraorbital scalp hematoma. CT CERVICAL SPINE FINDINGS Alignment: Stable from prior exam without traumatic subluxation. Anterolisthesis of C3 on C4 with retrolisthesis of C4 on C5, stable and likely degenerative. Skull base and vertebrae: No acute fracture. The dens and skull base are intact. Template scleroses secondary degenerative change, stable. Soft tissues and spinal canal: No prevertebral fluid or swelling. No visible canal hematoma. Disc levels: Diffuse disc space narrowing and endplate spurring. Moderate facet arthropathy. Degenerative changes are stable from prior. Upper chest: Emphysema. No acute findings. Other: None. IMPRESSION: 1. No acute intracranial abnormality. No skull fracture. Right frontal scalp hematoma. 2. No acute facial bone fracture. Remote nasal bone fractures. 3. Stable degenerative change in the cervical spine without acute fracture or subluxation. Electronically  Signed   By: Keith Rake M.D.   On: 09/10/2018 00:14   Dg Hip Unilat W Or Wo Pelvis 2-3 Views Right  Result Date: 09/09/2018 CLINICAL DATA:  [place] at home EXAM: DG HIP (WITH OR WITHOUT PELVIS) 2-3V RIGHT COMPARISON:  04/26/2017 FINDINGS: Surgical hardware in the lumbosacral spine. The SI joints are non widened. Pubic symphysis and rami are intact. Stable lucent and sclerotic lesion in the proximal left femur. No fracture or malalignment. IMPRESSION: No acute osseous abnormality Electronically Signed   By: Donavan Foil M.D.   On: 09/09/2018 23:58   Ct Maxillofacial Wo Contrast  Result Date: 09/10/2018 CLINICAL DATA:  Head trauma, minor, GCS>=13, high clinical risk, initial exam; Facial trauma, fx suspected fall, head trauma, nasal deformity. Patient reports fall backward 1 today. No loss of consciousness. EXAM: CT HEAD WITHOUT CONTRAST CT MAXILLOFACIAL WITHOUT CONTRAST CT CERVICAL SPINE WITHOUT CONTRAST TECHNIQUE:  Multidetector CT imaging of the head, cervical spine, and maxillofacial structures were performed using the standard protocol without intravenous contrast. Multiplanar CT image reconstructions of the cervical spine and maxillofacial structures were also generated. COMPARISON:  Head CT 08/03/2018. Head and cervical spine CT 07/19/2018. Face CT 05/15/2018 FINDINGS: CT HEAD FINDINGS Brain: Unchanged degree of atrophy and chronic small vessel ischemia. Remote lacunar infarct in left greater than right basal ganglia. No intracranial hemorrhage, mass effect, or midline shift. No hydrocephalus. The basilar cisterns are patent. No evidence of territorial infarct or acute ischemia. No extra-axial or intracranial fluid collection. Vascular: Atherosclerosis of skullbase vasculature without hyperdense vessel or abnormal calcification. Skull: No fracture or focal lesion. Other: Right frontal scalp hematoma. CT MAXILLOFACIAL FINDINGS Osseous: Remote nasal bone fractures unchanged in alignment from prior exam. No acute nasal bone fracture. Zygomatic arches and mandibles are intact. Streak artifact from dental hardware partially obscures regional evaluation. The temporomandibular joints are congruent. Orbits: Both orbits and globes are intact. No orbital fracture. Bilateral cataract resection. Small right supraorbital scalp hematoma. Sinuses: No acute fracture or fluid level. Paranasal sinuses are clear. Soft tissues: Right supraorbital scalp hematoma. CT CERVICAL SPINE FINDINGS Alignment: Stable from prior exam without traumatic subluxation. Anterolisthesis of C3 on C4 with retrolisthesis of C4 on C5, stable and likely degenerative. Skull base and vertebrae: No acute fracture. The dens and skull base are intact. Template scleroses secondary degenerative change, stable. Soft tissues and spinal canal: No prevertebral fluid or swelling. No visible canal hematoma. Disc levels: Diffuse disc space narrowing and endplate spurring.  Moderate facet arthropathy. Degenerative changes are stable from prior. Upper chest: Emphysema. No acute findings. Other: None. IMPRESSION: 1. No acute intracranial abnormality. No skull fracture. Right frontal scalp hematoma. 2. No acute facial bone fracture. Remote nasal bone fractures. 3. Stable degenerative change in the cervical spine without acute fracture or subluxation. Electronically Signed   By: Keith Rake M.D.   On: 09/10/2018 00:14    Procedures Procedures (including critical care time)  LACERATION REPAIR Performed by: Larene Pickett Authorized by: Larene Pickett Consent: Verbal consent obtained. Risks and benefits: risks, benefits and alternatives were discussed Consent given by: patient Patient identity confirmed: provided demographic data Prepped and Draped in normal sterile fashion Wound explored  Laceration Location: left forehead  Laceration Length: 4cm  No Foreign Bodies seen or palpated  Anesthesia: local infiltration  Local anesthetic: lidocaine 1% without epinephrine  Anesthetic total: 4 ml  Irrigation method: syringe Amount of cleaning: standard  Skin closure: 4-0 prolene  Number of sutures: 4  Technique: simple interrupted  Patient tolerance: Patient tolerated the procedure well with no immediate complications.  LACERATION REPAIR Performed by: Larene Pickett Authorized by: Larene Pickett Consent: Verbal consent obtained. Risks and benefits: risks, benefits and alternatives were discussed Consent given by: patient Patient identity confirmed: provided demographic data Prepped and Draped in normal sterile fashion Wound explored  Laceration Location: bridge of nose  Laceration Length: 1cm, superficial  No Foreign Bodies seen or palpated  Anesthesia: none  Local anesthetic: none  Anesthetic total: 0 ml  Irrigation method: syringe Amount of cleaning: standard  Skin closure: dermabond  Number of sutures: 0  Technique:  n/a  Patient tolerance: Patient tolerated the procedure well with no immediate complications.  CRITICAL CARE Performed by: Larene Pickett   Total critical care time: 40 minutes  Critical care time was exclusive of separately billable procedures and treating other patients.  Critical care was necessary to treat or prevent imminent or life-threatening deterioration.  Critical care was time spent personally by me on the following activities: development of treatment plan with patient and/or surrogate as well as nursing, discussions with consultants, evaluation of patient's response to treatment, examination of patient, obtaining history from patient or surrogate, ordering and performing treatments and interventions, ordering and review of laboratory studies, ordering and review of radiographic studies, pulse oximetry and re-evaluation of patient's condition.   Medications Ordered in ED Medications - No data to display   Initial Impression / Assessment and Plan / ED Course  I have reviewed the triage vital signs and the nursing notes.  Pertinent labs & imaging results that were available during my care of the patient were reviewed by me and considered in my medical decision making (see chart for details).  78 y.o. M here following a fall.  Reports he felt lightheaded when standing up to go to kitchen and fell.  Has history of same.  Struck his head on the floor, denies LOC.  Unsure how long he was in the floor.  This was actually his second fall today, son came and helped him up this morning.  EMS arrived, patient down on floor in pool of his own blood.  There was reported to be several empty vodka bottles in the home.  Patient is AAOx3, conversant, moving arms and legs well.  Has laceration over the forehead, abrasion/superficial laceration to bridge of nose with deformity, skin tear left elbow, and bruising to right hip that appears old.  Tetanus is UTD.  Patient does take plavix.  Will plan  for imaging, labs, EKG.  Given IVF-- 500cc bolus.  EKG with prolonged QT, no acute ischemia.  Labs as above-- potassium low at 2.5, ethanol 50, magnesium low at 1.0.  Given 40mg  K+ PO as well as run of IV, 2g Mg+ also given.  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  Lacerations repaired as above, patient tolerated well.  Will monitor.    IV K+ and Mg+ finished, given additional PO K+.  Repeat chemistry with K+ of 3.7.  Repeat EKG with improved QT.   Patient has remained alert and oriented here, moving around without issue.  Feel he is stable for discharge.   Close follow-up with PCP, sutures will need to be removed in 7-10 days.  He can return here for any new/acute changes.    Patient seen and evaluated with attending physician, Dr. Ellender Hose, who agrees with assessment and plan of care.  Final Clinical Impressions(s) / ED Diagnoses   Final diagnoses:  Fall, initial encounter  Laceration of forehead, initial encounter  Hypokalemia  Hypomagnesemia    ED Discharge Orders    None       Larene Pickett, PA-C 09/10/18 0606    Duffy Bruce, MD 09/10/18 [phone]

## 2018-09-10 LAB — I-STAT CHEM 8, ED
BUN: 22 mg/dL (ref 8–23)
CALCIUM ION: 0.96 mmol/L — AB (ref 1.15–1.40)
Chloride: 97 mmol/L — ABNORMAL LOW (ref 98–111)
Creatinine, Ser: 1.3 mg/dL — ABNORMAL HIGH (ref 0.61–1.24)
Glucose, Bld: 106 mg/dL — ABNORMAL HIGH (ref 70–99)
HCT: 30 % — ABNORMAL LOW (ref 39.0–52.0)
Hemoglobin: 10.2 g/dL — ABNORMAL LOW (ref 13.0–17.0)
Potassium: 3.7 mmol/L (ref 3.5–5.1)
Sodium: 136 mmol/L (ref 135–145)
TCO2: 30 mmol/L (ref 22–32)

## 2018-09-10 LAB — MAGNESIUM: Magnesium: 1 mg/dL — ABNORMAL LOW (ref 1.7–2.4)

## 2018-09-10 MED ORDER — LIDOCAINE HCL (PF) 1 % IJ SOLN
5.0000 mL | Freq: Once | INTRAMUSCULAR | Status: AC
  Administered 2018-09-10: 5 mL via INTRADERMAL
  Filled 2018-09-10: qty 5

## 2018-09-10 MED ORDER — POTASSIUM CHLORIDE CRYS ER 20 MEQ PO TBCR
40.0000 meq | EXTENDED_RELEASE_TABLET | Freq: Once | ORAL | Status: AC
  Administered 2018-09-10: 40 meq via ORAL
  Filled 2018-09-10: qty 2

## 2018-09-10 MED ORDER — MAGNESIUM SULFATE 2 GM/50ML IV SOLN
2.0000 g | Freq: Once | INTRAVENOUS | Status: AC
  Administered 2018-09-10: 2 g via INTRAVENOUS
  Filled 2018-09-10: qty 50

## 2018-09-10 NOTE — Discharge Instructions (Signed)
Your imaging today was normal.  Your potassium and magnesium were low, we have corrected them with IV supplements and are now normal.   Your sutures will need to be taken out in about 7-10 days.  Follow-up with your primary care doctor for this. Return here for any new/acute changes.

## 2018-09-10 NOTE — ED Notes (Signed)
Pt's wound oozing from suture site. Wound remains closed, but slow venous bleeding noted at suture points. QuikClot gauze placed on wound and new kerlex gauze roll wrapped over the top of dressing. Pt instructed to hold consistent pressure on wound area to help stop bleeding. Pt verbalizes understanding and agreement.

## 2018-09-10 NOTE — ED Notes (Signed)
Reviewed d/c instructions with pt, who verbalized understanding and had no outstanding questions. Pt departed in NAD.   

## 2018-09-16 ENCOUNTER — Other Ambulatory Visit: Payer: Self-pay | Admitting: Cardiology

## 2018-12-02 DEATH — deceased

## 2019-07-19 IMAGING — CT CT HEAD W/O CM
4 series · 15 of 47 positions shown, 17 images · non-contrast
Comparison: 07/19/2018

CLINICAL DATA: Fall.  Head trauma.

EXAM:
CT HEAD WITHOUT CONTRAST
TECHNIQUE: Contiguous axial images were obtained from the base of the skull
through the vertex without intravenous contrast.

[Series 3: head without · axial · non-contrast · 0.49mm/px · z∈[-180,-60]mm · 7 of 34 slices shown, 9 images]
[im 5/34  brain]
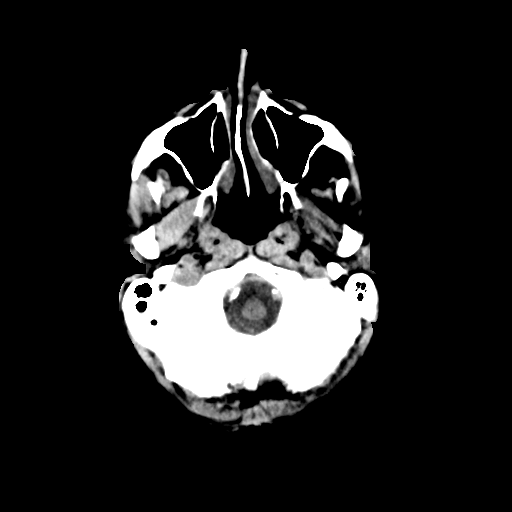
[im 5/34  bone]
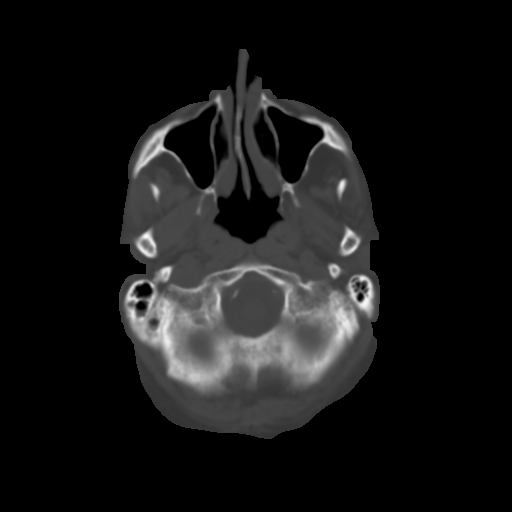
[im 9/34  brain]
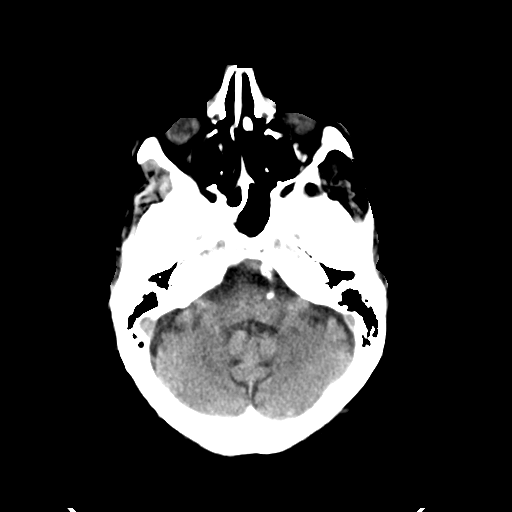
[im 13/34  brain]
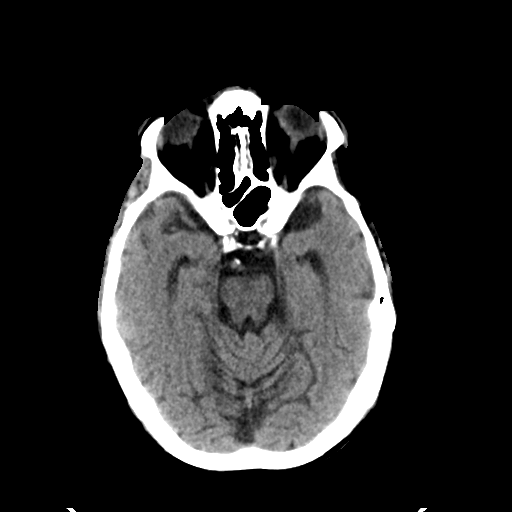
[im 17/34  brain]
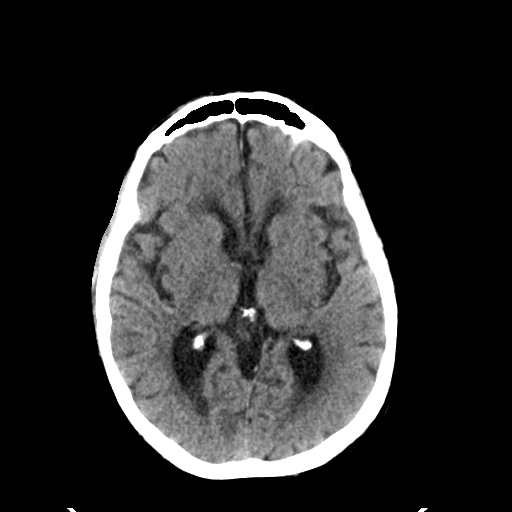
[im 21/34  brain]
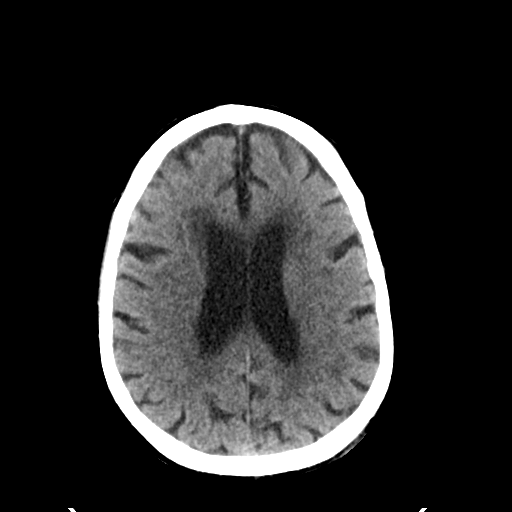
[im 21/34  bone]
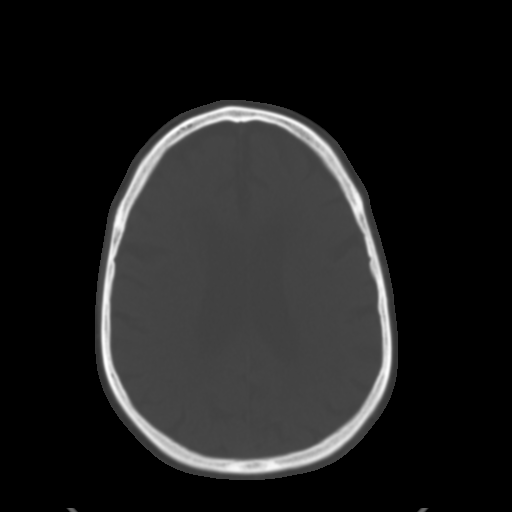
[im 25/34  brain]
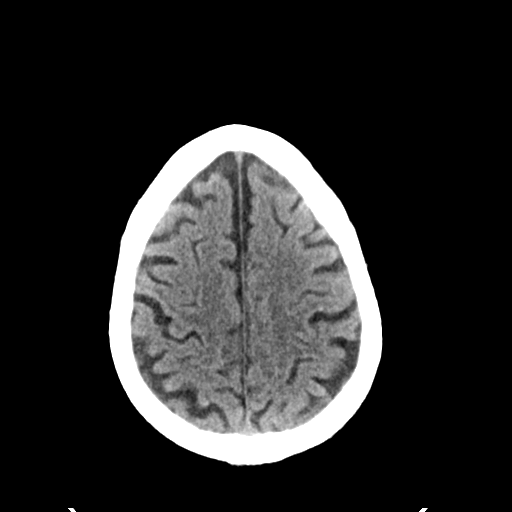
[im 29/34  brain]
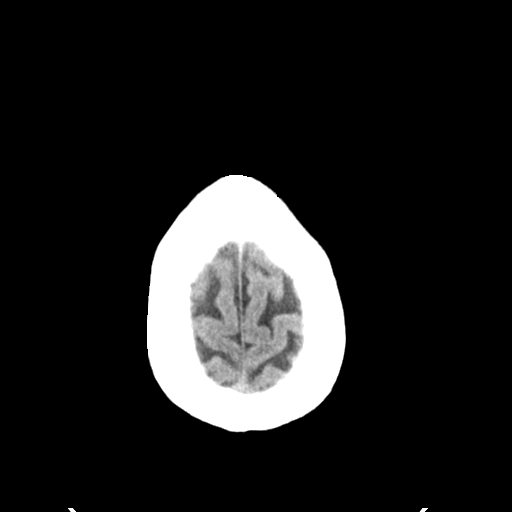

[Series 4: head bone · axial · 0.49mm/px · z∈[-184,-168]mm · 2 of 84 slices shown]
[im 9/84  bone]
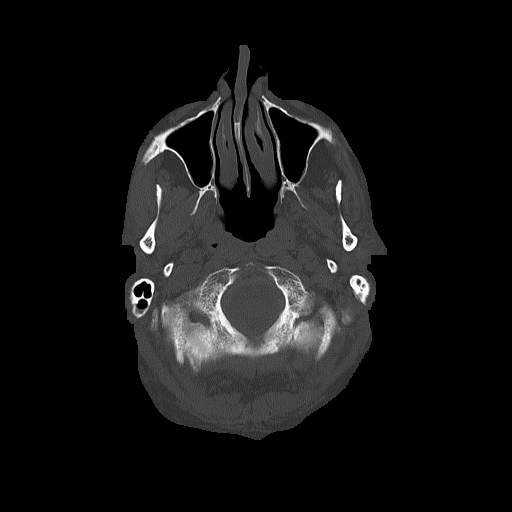
[im 17/84  bone]
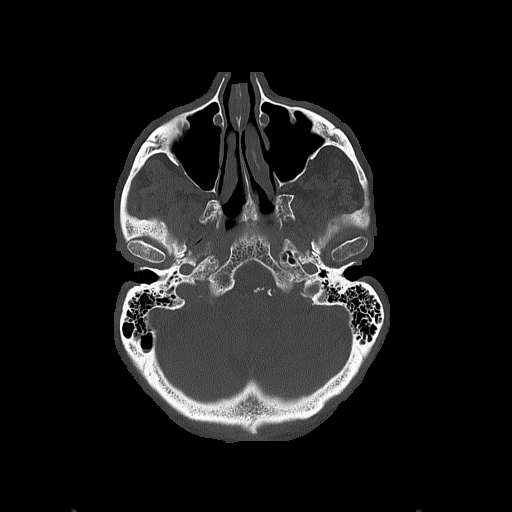

[Series 5: head without cor · coronal · non-contrast · 0.33mm/px · 3 of 73 slices shown]
[im 25/73  brain]
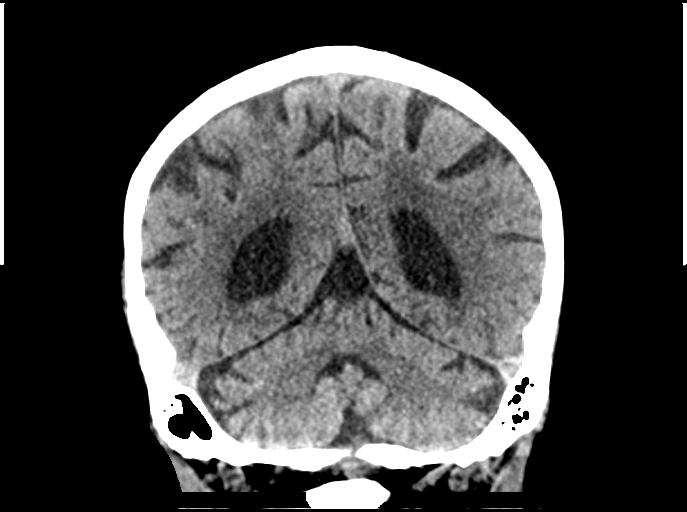
[im 33/73  brain]
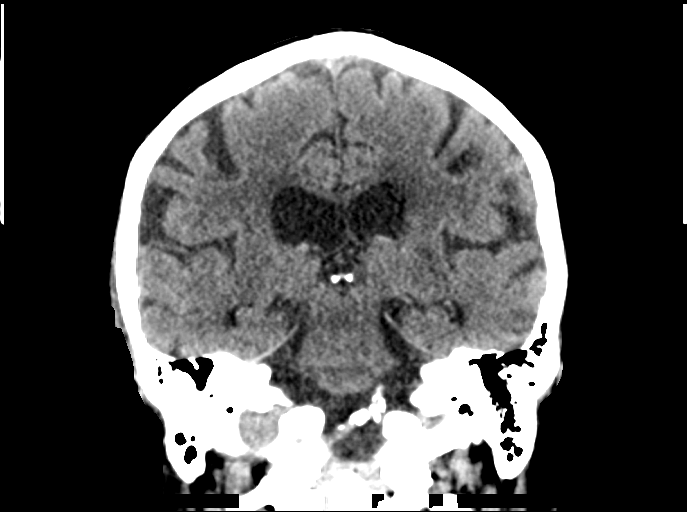
[im 41/73  brain]
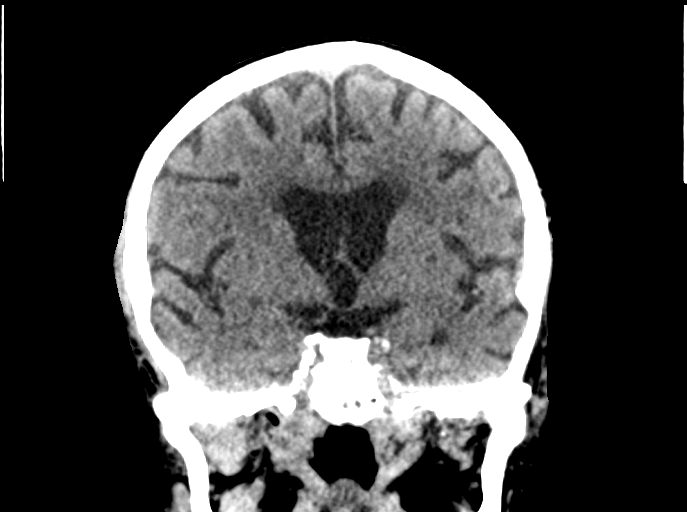

[Series 6: head without sag · sagittal · non-contrast · 0.33mm/px · 3 of 67 slices shown]
[im 23/67  brain]
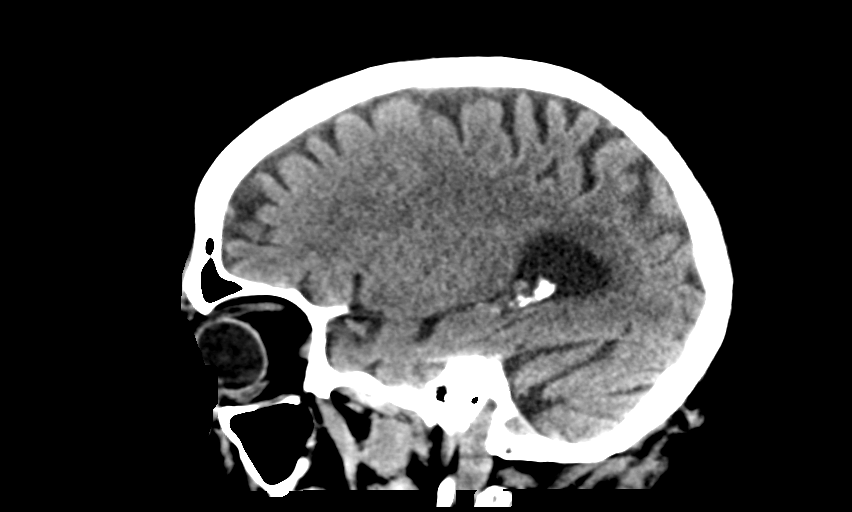
[im 34/67  brain]
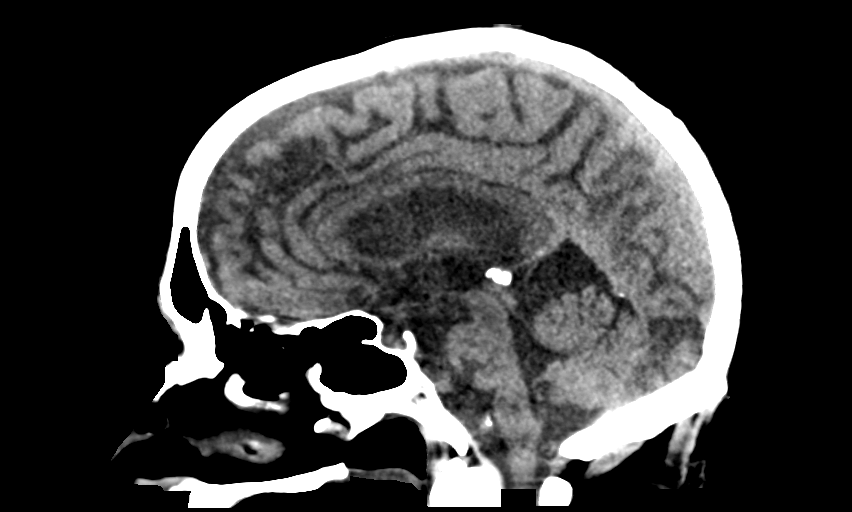
[im 45/67  brain]
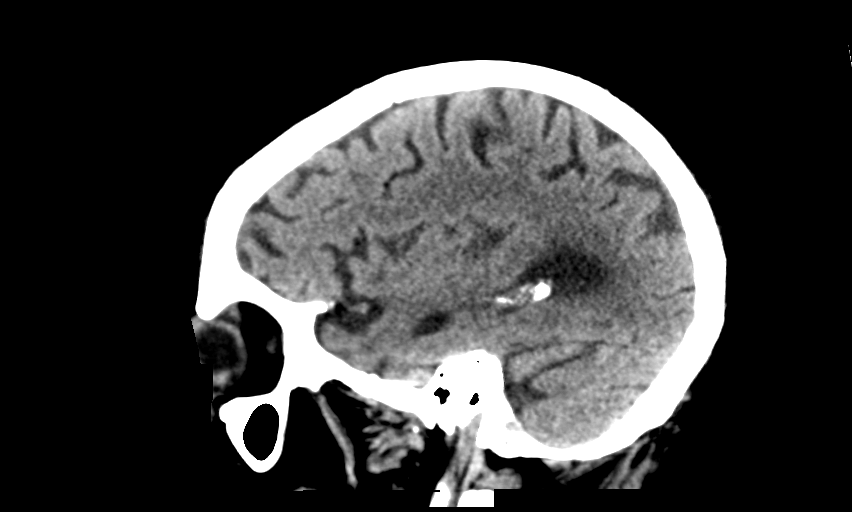

[15 of 47 positions shown; findings below may reference images not displayed]

FINDINGS: Brain: There is atrophy and chronic small vessel disease changes. No
acute intracranial abnormality. Specifically, no hemorrhage,
hydrocephalus, mass lesion, acute infarction, or significant
intracranial injury.

Vascular: No hyperdense vessel or unexpected calcification.

Skull: No acute calvarial abnormality.

Sinuses/Orbits: Visualized paranasal sinuses and mastoids clear.
Orbital soft tissues unremarkable.

Other: None
IMPRESSION: No acute intracranial abnormality.

Atrophy, chronic microvascular disease.
